# Patient Record
Sex: Female | Born: 1950
Health system: Southern US, Community
[De-identification: ages and names within clinical notes are randomized; demographics above are authoritative.]

## PROBLEM LIST (undated history)

## (undated) DIAGNOSIS — I10 Essential (primary) hypertension: Secondary | ICD-10-CM

## (undated) DIAGNOSIS — M1990 Unspecified osteoarthritis, unspecified site: Secondary | ICD-10-CM

## (undated) DIAGNOSIS — C801 Malignant (primary) neoplasm, unspecified: Secondary | ICD-10-CM

## (undated) DIAGNOSIS — R790 Abnormal level of blood mineral: Secondary | ICD-10-CM

## (undated) DIAGNOSIS — G8929 Other chronic pain: Secondary | ICD-10-CM

## (undated) DIAGNOSIS — I639 Cerebral infarction, unspecified: Secondary | ICD-10-CM

## (undated) DIAGNOSIS — E785 Hyperlipidemia, unspecified: Secondary | ICD-10-CM

## (undated) HISTORY — DX: Essential (primary) hypertension: I10

## (undated) HISTORY — DX: Hyperlipidemia, unspecified: E78.5

---

## 1998-09-14 ENCOUNTER — Encounter: Payer: Self-pay | Admitting: *Deleted

## 1998-09-14 ENCOUNTER — Ambulatory Visit (HOSPITAL_COMMUNITY): Admission: RE | Admit: 1998-09-14 | Discharge: 1998-09-14 | Payer: Self-pay | Admitting: *Deleted

## 1998-09-28 ENCOUNTER — Other Ambulatory Visit: Admission: RE | Admit: 1998-09-28 | Discharge: 1998-09-28 | Payer: Self-pay | Admitting: *Deleted

## 1999-12-08 ENCOUNTER — Other Ambulatory Visit: Admission: RE | Admit: 1999-12-08 | Discharge: 1999-12-08 | Payer: Self-pay | Admitting: *Deleted

## 1999-12-27 ENCOUNTER — Ambulatory Visit (HOSPITAL_COMMUNITY): Admission: RE | Admit: 1999-12-27 | Discharge: 1999-12-27 | Payer: Self-pay | Admitting: *Deleted

## 1999-12-27 ENCOUNTER — Encounter: Payer: Self-pay | Admitting: *Deleted

## 2002-03-09 ENCOUNTER — Inpatient Hospital Stay (HOSPITAL_COMMUNITY): Admission: EM | Admit: 2002-03-09 | Discharge: 2002-03-11 | Payer: Self-pay | Admitting: Emergency Medicine

## 2002-03-09 ENCOUNTER — Encounter: Payer: Self-pay | Admitting: Emergency Medicine

## 2002-03-10 ENCOUNTER — Encounter: Payer: Self-pay | Admitting: Cardiology

## 2005-03-24 ENCOUNTER — Ambulatory Visit: Payer: Self-pay | Admitting: Internal Medicine

## 2005-05-16 ENCOUNTER — Ambulatory Visit: Payer: Self-pay | Admitting: Internal Medicine

## 2006-03-27 ENCOUNTER — Ambulatory Visit: Payer: Self-pay | Admitting: Internal Medicine

## 2007-03-26 DIAGNOSIS — E785 Hyperlipidemia, unspecified: Secondary | ICD-10-CM

## 2007-03-26 DIAGNOSIS — I10 Essential (primary) hypertension: Secondary | ICD-10-CM | POA: Insufficient documentation

## 2007-04-10 ENCOUNTER — Telehealth: Payer: Self-pay | Admitting: Internal Medicine

## 2007-05-17 ENCOUNTER — Telehealth: Payer: Self-pay | Admitting: *Deleted

## 2007-05-17 ENCOUNTER — Ambulatory Visit: Payer: Self-pay | Admitting: Internal Medicine

## 2007-05-17 LAB — CONVERTED CEMR LAB
Cholesterol, target level: 200 mg/dL
HDL goal, serum: 40 mg/dL
LDL Goal: 160 mg/dL

## 2007-06-28 ENCOUNTER — Telehealth: Payer: Self-pay | Admitting: Internal Medicine

## 2008-01-24 ENCOUNTER — Telehealth: Payer: Self-pay | Admitting: *Deleted

## 2008-06-01 ENCOUNTER — Ambulatory Visit: Payer: Self-pay | Admitting: Internal Medicine

## 2008-06-01 DIAGNOSIS — J019 Acute sinusitis, unspecified: Secondary | ICD-10-CM

## 2008-06-02 ENCOUNTER — Telehealth: Payer: Self-pay | Admitting: *Deleted

## 2009-03-31 ENCOUNTER — Ambulatory Visit: Payer: Self-pay | Admitting: Internal Medicine

## 2009-04-01 LAB — CONVERTED CEMR LAB
BUN: 12 mg/dL (ref 6–23)
CO2: 26 meq/L (ref 19–32)
Calcium: 9.4 mg/dL (ref 8.4–10.5)
Chloride: 106 meq/L (ref 96–112)
Creatinine, Ser: 0.9 mg/dL (ref 0.4–1.2)
GFR calc non Af Amer: 68.74 mL/min (ref >60)
Glucose, Bld: 106 mg/dL — ABNORMAL HIGH (ref 70–99)
Potassium: 3.6 meq/L (ref 3.5–5.1)
Sodium: 141 meq/L (ref 135–145)
TSH: 1.04 microintl units/mL (ref 0.35–5.50)

## 2009-04-07 ENCOUNTER — Telehealth (INDEPENDENT_AMBULATORY_CARE_PROVIDER_SITE_OTHER): Payer: Self-pay | Admitting: *Deleted

## 2009-04-20 ENCOUNTER — Telehealth (INDEPENDENT_AMBULATORY_CARE_PROVIDER_SITE_OTHER): Payer: Self-pay | Admitting: *Deleted

## 2009-09-06 ENCOUNTER — Ambulatory Visit: Payer: Self-pay | Admitting: Internal Medicine

## 2009-09-06 ENCOUNTER — Telehealth: Payer: Self-pay | Admitting: *Deleted

## 2010-08-16 NOTE — Assessment & Plan Note (Signed)
Summary: sinus inf/headaches/cjr----PT [facility] // RS   Vital Signs:  Patient profile:   60 year old female Menstrual status:  postmenopausal Weight:      194 pounds Temp:     98.5 degrees F oral Pulse rate:   78 / minute BP sitting:   130 / 80  (left arm) Cuff size:   regular  Vitals Entered By: Romualdo Bolk, CMA Duncan Dull) (September 06, 2009 9:31 AM) CC: Sinus pressure and ha that started on 2/19   History of Present Illness: [name] comesin for .sda for above  .  " I have  a sinus infection again"       Different than a head cold because of the severity of the face pressure.   Cough last night.   used hot compresses   Ibuprofen some help.   Bifrontal is the worse area.  and using saline    washes.   10/10  Pain    . some cough but no SOB    . seems to get sinusitis about 2 x per year / trigger.   HT : has been doing well and no se of meds .  Still tobacco free.    Preventive Screening-Counseling & Management  Alcohol-Tobacco     Alcohol drinks/day: 1     Alcohol type: wine     Smoking Status: quit     Year Quit: 10 years ago  Caffeine-Diet-Exercise     Caffeine use/day: 5     Does Patient Exercise: yes  Current Medications (verified): 1)  Lisinopril-Hydrochlorothiazide 20-25 Mg Tabs (Lisinopril-Hydrochlorothiazide) .Marland Kitchen.. 1 By Mouth Once Daily 2)  Bayer Heart Health Advantage 400 Mg Tabs (Phytosterols)  Allergies (verified): No Known Drug Allergies  Past History:  Past medical, surgical, family and social histories (including risk factors) reviewed, and no changes noted (except as noted below).  Past Medical History: Reviewed history from 06/01/2008 and no changes required. Hyperlipidemia Hypertension    Family History: Reviewed history from 03/31/2009 and no changes required. Family History Diabetes 1st degree relative sib Family History High cholesterol Family History Hypertension Family History Lung cancer mom  Social History: Reviewed history  from 03/31/2009 and no changes required. 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         no regular ha or migraine hx   Physical Exam  General:  tired appearing in nad     washed out and congested  some nasal crease  Head:  normocephalic and atraumatic.   Eyes:  vision grossly intact, pupils equal, and pupils round.   Ears:  R ear normal and L ear normal.   Nose:  no external deformity and no external erythema.  congested   tnder frontal ethmoid area bilaterally  Mouth:  pharynx pink and moist.   Neck:  No deformities, masses, or tenderness noted. Lungs:  Normal respiratory effort, chest expands symmetrically. Lungs are clear to auscultation, no crackles or wheezes. Heart:  Normal rate and regular rhythm. S1 and S2 normal without gallop, murmur, click, rub or other extra sounds.no lifts.   Cervical Nodes:  No lymphadenopathy noted Psych:  Oriented X3, good eye contact, and not  anxious appearing.     Impression & Recommendations:  Problem # 1:  SINUSITIS- ACUTE-NOS (ICD-461.9) ? frontall  severity of pain  is impressive  and doesnt really seem like migraine   . will need follow up if persistent or progressive  . The following medications were removed from the medication list:    Amoxicillin 500 Mg Caps (Amoxicillin) .Marland Kitchen... 2 by mouth three times a day for sinusitis Her updated medication list for this problem includes:    Amoxicillin-pot Clavulanate 875-125 Mg Tabs (Amoxicillin-pot clavulanate) .Marland Kitchen... 1 by mouth two times a day for sinusitis  Problem # 2:  HYPERTENSION (ICD-401.9) stable  Her updated medication list for this  problem includes:    Lisinopril-hydrochlorothiazide 20-25 Mg Tabs (Lisinopril-hydrochlorothiazide) .Marland Kitchen... 1 by mouth once daily  Complete Medication List: 1)  Lisinopril-hydrochlorothiazide 20-25 Mg Tabs (Lisinopril-hydrochlorothiazide) .Marland Kitchen.. 1 by mouth once daily 2)  Bayer Heart Health Advantage 400 Mg Tabs (Phytosterols) 3)  Amoxicillin-pot Clavulanate 875-125 Mg Tabs (Amoxicillin-pot clavulanate) .Marland Kitchen.. 1 by mouth two times a day for sinusitis  Patient Instructions: 1)  expect to be  better in 5 days   call if not better  Prescriptions: AMOXICILLIN-POT CLAVULANATE 875-125 MG TABS (AMOXICILLIN-POT CLAVULANATE) 1 by mouth two times a day for sinusitis  #20 x 0   Entered and Authorized by:   Madelin Headings MD   Signed by:   Madelin Headings MD on 09/06/2009   Method used:   Print then Give to Patient   RxID:   [id number]

## 2010-08-16 NOTE — Progress Notes (Signed)
Summary: change antibiotics  Phone Note Call from Patient   Caller: Patient Call For: Madelin Headings MD Summary of Call: Pt cannot afford the Augmentin she was prescribed today by Dr. Fabian Sharp.  Would like to have it changed to Amoxicillin......Marland KitchenNicolette Bang ([place]). [phone]  Follow-up for Phone Call        ok amoxicillin 500 2 by mouth three times a day for 10 days Follow-up by: Madelin Headings MD,  September 06, 2009 12:19 PM  Additional Follow-up for Phone Call Additional follow up Details #1::        Rx sent to pharmacy and then I called the pharmacy and changed the directions to three times a day. Pt is aware of this. Additional Follow-up by: Romualdo Bolk, CMA (AAMA),  September 06, 2009 12:48 PM    New/Updated Medications: AMOXICILLIN 500 MG CAPS (AMOXICILLIN) 2 by mouth two times a day x 10 days Prescriptions: AMOXICILLIN 500 MG CAPS (AMOXICILLIN) 2 by mouth two times a day x 10 days  #40 x 0   Entered by:   Romualdo Bolk, CMA (AAMA)   Authorized by:   Madelin Headings MD   Signed by:   Romualdo Bolk, CMA (AAMA) on 09/06/2009   Method used:   Electronically to        [facility]  [phone]* (retail)       [address]       [place], [place]  [address]       Ph: [phone] or [phone]       Fax: [phone]   RxID:   [id number]

## 2010-11-24 ENCOUNTER — Other Ambulatory Visit: Payer: Self-pay | Admitting: Internal Medicine

## 2010-11-24 NOTE — Telephone Encounter (Signed)
Sent rx to pharmacy

## 2010-12-02 NOTE — Cardiovascular Report (Signed)
NAMEJESSICAMARIE, [name] NO.:  [id number]   MEDICAL RECORD NO.:  [id number]                   PATIENT TYPE:  INP   LOCATION:  2912                                 FACILITY:  [facility]   PHYSICIAN:  Arturo Morton. Riley Kill, M.D. [facility]         DATE OF BIRTH:  11-14-1950   DATE OF PROCEDURE:  03/09/2002  DATE OF DISCHARGE:                              CARDIAC CATHETERIZATION   INDICATIONS FOR PROCEDURE:  The patient is a 60 year old female who presents  with prolonged chest pain.  Electrocardiograms were nondiagnostic but  enzymes were positive.  She had recurrent ischemic chest discomfort and was  subsequently brought to the catheterization laboratory for further  evaluation.   PROCEDURE:  1. Left heart catheterization.  2. Selective coronary arteriography.  3. Selective left ventriculography.  4. Percutaneous transluminal coronary angioplasty and stenting of the     circumflex coronary artery.   DESCRIPTION OF PROCEDURE:  The patient was brought to the catheterization  lab and prepped and draped in the usual fashion.  We had difficulty engaging  the right femoral artery.  The left femoral artery was engaged.  A #7 French  sheath was placed.  Views of the left and right coronary arteries were  obtained in multiple angiographic projections.  Ventriculography was  performed in the RAO projection.  It was felt that percutaneous intervention  should be attempted with total occlusion of the circumflex artery.  Heparin  and Integrilin were given according to protocol with adequate ACTs.  We  tried to cross and had a difficult time getting across with the guidewire.  After extensive manipulation, we were able to eventually get across using a  [employer] PT wire.  Predilatations were performed and then  the vessel was eventually stented using a 3.0 x 24-mm length Express 2  stent.  This was taken across the area of total occlusion and an excellent  angiographic result was achieved.  We then used a 3.75 x 20-mm Quantum  Maverick which was taken up to about 10-12 atmospheres with full deployment  of the stent.  The patient was given oral Plavix and previously had been  given aspirin.  The procedure was subsequently completed.  All catheters  were removed.  The femoral sheath was sewn into place.   HEMODYNAMIC DATA:  1. Central aortic pressure 105/77.  2. Left ventricle 104/7.  3. No aortic to left ventricular gradient on pullback across the aortic     valve.   ANGIOGRAPHIC DATA:  1. Ventriculography was performed in the RAO projection.  Overall ejection     fraction was calculated at 52%.  There is inferobasal hypokinesis.     Significant mitral regurgitation was not present.  2. The left main coronary artery was free of critical disease.  3. The left anterior descending artery coursed to the apex.  There is about     30% segmental narrowing in the LAD just beyond the  diagonal.  The     diagonal itself has 40-50% narrowing segmentally in the proximal to mid     vessel.  The distal vessel is suitable for grafting.  4. The circumflex coronary artery is totally occluded after the first     marginal.  Following reperfusion therapy, the 100% stenosis was reduced     to 0% residual luminal narrowing.  There was a marked improvement in the     appearance of the artery with establishment of TIMI-3 flow from TIMI-0     flow.  Distally, the vessel provided three marginal branches.  One was     quite large and had about 40-50% proximal narrowing.  5. The right coronary artery demonstrates 40% proximal narrowing and then     40%, and 30-40% narrowing in the distal portion of the mid vessel.  There     is a posterior descending and posterolateral system.  These provide     collaterals to the distal circumflex system.   CONCLUSION:  1. Acute myocardial infarction due to occlusion of the circumflex artery.  2. Successful reperfusion therapy  provided with angioplasty and stenting of     the mid circumflex coronary artery as described in the above text.   DISPOSITION:  1. Discontinue smoking.  2. Aspirin and Plavix.  3. Glycoprotein IIb/IIIa inhibitors x 12-18 hours.  4. Cardiac rehabilitation program.  5. Continued beta blockade.                                               Arturo Morton. Riley Kill, M.D. [facility]    TDS/MEDQ  D:  03/10/2002  T:  03/10/2002  Job:  [id number]   cc:   Cecil Cranker, M.D. [facility]   Cardiac Catheterization Lab   Carolin Sicks, M.D. [facility]  [address]  [place]  [place] [address]

## 2010-12-02 NOTE — H&P (Signed)
[name], [name] NO.:  [id number]   MEDICAL RECORD NO.:  [id number]                   PATIENT TYPE:  EMS   LOCATION:  MAJO                                 FACILITY:  [facility]   PHYSICIAN:  Gerrit Friends. Dietrich Pates, M.D. [facility]        DATE OF BIRTH:  10-03-1950   DATE OF ADMISSION:  03/09/2002  DATE OF DISCHARGE:                                HISTORY & PHYSICAL   HISTORY OF PRESENT ILLNESS:  This 60 year old woman, a sister-in-law of one  or our office nurses, presents with prolonged chest discomfort.  The patient  has generally enjoyed excellent health.  She has had mild hypertension for  the past few years that has been well-treated medically by her PMD, Dr.  Neta Mends. Panosh.  She has been very active all of her life, including  kayaking and mountain biking.  She has been playing an unaccustomed amount  of tennis in recent weeks without difficulty.  For the past few days she has  noted malaise and chest heaviness.  There has been no associated dyspnea,  diaphoresis nor nausea.  The discomfort is fairly diffuse in the mid chest,  and is nonradiating.  There is no relationship to exertion or body position.  There has been some waxing and waning with intermittent resolution of her  discomfort.  She is free of symptoms at the present time.  Her brother, who  manages a clinical laboratory, obtained an electrocardiogram  which was  interpreted as abnormal and recommended further evaluation in the emergency  room.   Her cardiovascular risk factors are significant.  She has smoked 1/2 pack of  cigarettes per day for approximately 30 years.  She is unaware of her  cholesterol status.  She is four years post-menopausal.  There is a positive  family history.  Her father suffered a myocardial infarction at a young age.   PAST MEDICAL HISTORY:  Unremarkable.  She has never been admitted to a  hospital.  She has never had surgery.  She has had previous  electrocardiograms, which were interpreted as normal.  She does not recall  the antihypertensive medication that she is currently taking.   ALLERGIES:  She reports no allergies to medications.   SOCIAL HISTORY:  Unmarried, but has had a long-term companion.  No excessive  use of alcohol.   REVIEW OF SYSTEMS:  Negative except as noted above.   PHYSICAL EXAMINATION:  GENERAL:  A pleasant, well-appearing woman in no  acute distress.  VITAL SIGNS:  Heart rate 65 and regular, blood pressure 135/90, respirations  20 and unlabored, temperature 98 degrees.  O2 saturation 98% on room air.  NECK:  No jugular venous distention.  No carotid bruits.  LUNGS:  Clear.  HEART:  Normal first and second heart sounds.  ABDOMEN:  Soft, nontender.  No organomegaly.  EXTREMITIES:  No edema.  Normal distal pulses.  NEUROMUSCULAR:  Symmetric and strength and tone.  HEMATOPOIETIC:  No adenopathy.   LABORATORY AND ACCESSORY DATA:  Electrocardiogram:  Normal sinus rhythm.  Mild downsloping ST segment depression in leads V4-V6.   IMPRESSION AND PLAN:  A 60 year old woman with moderate cardiovascular risk  factors, but a generally active life style, without previous symptoms,  presents with chest discomfort that has some characteristics of myocardial  ischemia, but is basically atypical.  Her electrocardiographic abnormalities  are of concern, but could represent changes related to left ventricular  hypertrophy.   She certainly merits an observation stay with serial cardiac markers and an  echocardiogram.  Initial treatment will be with ________ which may be her  antihypertensive medication, aspirin, and sublingual nitroglycerin.  Further  workup and treatment will depend upon her initial course.                                               Gerrit Friends. Dietrich Pates, M.D. [facility]    RMR/MEDQ  D:  03/09/2002  T:  03/11/2002  Job:  [id number]

## 2010-12-02 NOTE — Discharge Summary (Signed)
NAMEMARCELE, [name] NO.:  [id number]   MEDICAL RECORD NO.:  [id number]                   PATIENT TYPE:  INP   LOCATION:  2018                                 FACILITY:  [facility]   PHYSICIAN:  Gerrit Friends. Dietrich Pates, M.D. [facility]        DATE OF BIRTH:  1951/02/23   DATE OF ADMISSION:  03/09/2002  DATE OF DISCHARGE:  03/11/2002                           DISCHARGE SUMMARY - REFERRING   SUMMARY OF HISTORY:  The patient is a 60 year old white female with a  history of mild hypertension, tobacco use and family history of coronary  artery disease.  She presented with a substernal chest heaviness that has  been nagging her for the preceding 24 hours.  She was noted to have ST  segment depression on her EKG on V4 through V6.  Her initial assessment was  negative but she was admitted for further evaluation.  Please refer to Dr.  Marvel Plan dictation.   LABORATORY DATA:  Fasting lipids of the 25th showed a total cholesterol of  192, triglycerides 96, HDL 62, LDL 111.  Initial CK was elevated at 1226  with MB of 146.6, relatively index 12.0 and troponin 12.47.  Second CK was  947 with MB of 67.2, relative index 7.1 and troponin 59.0.  Subsequent  enzymes and troponins were declining.  Admission sodium was 139, potassium  3.5, BUN 13, creatinine 0.8.  AST was slightly elevated at 132.  Her ALT was  slightly elevated at 41.  PT was 12.0, PTT 25.  H&H 15.2 and 45.3, normal  indices, platelets 210, WBC 14.2.  Chest x-ray did not show any active  disease.  Echocardiogram performed on 03/10/02 showed an ejection fraction  between 50 and 55 percent with basilar inferoposterior akinesis.  Left  atrial size was upper limits of normal.  Initial EKG showed normal sinus  rhythm, nonspecific ST-T wave changes.  Subsequent EKG showed T wave  inversion in leads 3 and AVF and some slight ST segment depression V4  through V6.   HOSPITAL COURSE:  The patient was initially admitted to  unit 2000.  She  began again having some further chest discomfort.  EKG did not show any  acute changes and Dr. Teena Dunk and I reviewed with Dr. Dietrich Pates and Dr.  Riley Kill and placed her on heparin and Integrelin and took her to the  catheterization lab on the evening of 03/09/2002.  Cardiac catheterization  was performed by Dr. Riley Kill.  According to his note she had 40% proximal  RCA, 40% mid RCA, 30-40% distal RCA.  She had 30% proximal LAD just up to  the diagonal one.  The diagonal one had a 40-50%/50% proximal lesion.  The  circumflex was totally occluded proximally with right to left laterals.  Dr.  Riley Kill performed angioplasty stenting reducing the 100% lesion to 0% with  an express stent.  Total bed rest sheath removal.  Catheterization site was  intact.  Enzymes and EKGs confirmed  non-Q wave myocardial infarction.  She  was maintained on Integrelin, heparin and nitroglycerin drip post her  catheterization.  Smoking cessation consult and cardiac rehab consult were  obtained.  Echocardiogram was performed as previously described.   By the 26th she was ambulating in the halls without difficulty.  Catheterization in her right and left groin showed some slight ecchymosis.  After review Dr. Riley Kill felt that she could be discharged home.   DISCHARGE DIAGNOSES:  1. Acute non-Q wave lateral myocardial infarction status post angioplasty     and stenting to the circumflex as previously described.  Residual     nonobstructive coronary artery disease with an ejection fraction of 52%     and inferobasilar hypokinesis.  2. Tobacco use.  3. Hypertension.   DISPOSITION:  She is discharged home.  She is given prescriptions for Plavix  75 mg q.d. for one month, coated aspirin 325 mg q.d., sublingual  nitroglycerin as needed, Zocor 40 mg q.h.s., and Toprol XL 25 mg q.d.  She  will stop by the office on the way home to pick up some samples of the  Zocor.  Our office did not have any samples of  Plavix.  She has been  instructed not to take her home medications that she was prior to admission,  but to bring all medications to her followup appointment.  She is advised no  lifting, working, driving, sexual activity or heavy exertion until seen by  the physician.  Maintain a low salt, fat and cholesterol diet.  If she has  any problems with her catheterization site she was asked to call us  immediately.  She was advised no smoking or tobacco products.  She will need  fasting lipids and LFTs in six weeks since we placed her on Zocor.  She will  see Dr. Rosalyn Charters PA on Thursday, March 20, 2002 at 10:30 a.m.     Malu Pellegrini DICTATOR                          Gerrit Friends. Dietrich Pates, M.D. [facility]    DD/MEDQ  D:  03/11/2002  T:  03/12/2002  Job:  [id number]

## 2011-06-17 ENCOUNTER — Other Ambulatory Visit: Payer: Self-pay | Admitting: Internal Medicine

## 2011-10-14 ENCOUNTER — Other Ambulatory Visit: Payer: Self-pay | Admitting: Internal Medicine

## 2011-10-18 ENCOUNTER — Telehealth: Payer: Self-pay | Admitting: Internal Medicine

## 2011-10-18 NOTE — Telephone Encounter (Signed)
Pt called and has a question regarding lisinopril-hydrochlorothiazide (PRINZIDE,ZESTORETIC) 20-25 MG per that she just pick up from pharmacy.

## 2011-10-19 NOTE — Telephone Encounter (Signed)
We have not seen her in almost  2 years . Her last labs in the EHR were in 2010 that I could find.   She needs to get BMP done here or elsewhere .  We will not keep refilling medication unless she gets lab and ov in the near future.   For now ok  To send in 60 pills as she requests and have her arrange above before it runs out.

## 2011-10-19 NOTE — Telephone Encounter (Signed)
Pt states she cannot come in at this time and she will make an appt to come in soon.  Pt states she has a new insurance and has no coverage on medications until after $200.  Pt states she can get a 90 day supply for $11.99.  Pls advise of in additional #60 tablets of lisinopril can be called in.

## 2011-10-20 NOTE — Telephone Encounter (Signed)
Called in additional 60 tablets to pharmacy.  Pt states she will make an appt in May sometime due to having a lot going on at this time.

## 2011-12-15 ENCOUNTER — Encounter: Payer: Self-pay | Admitting: Internal Medicine

## 2011-12-15 ENCOUNTER — Ambulatory Visit (INDEPENDENT_AMBULATORY_CARE_PROVIDER_SITE_OTHER): Payer: BC Managed Care – PPO | Admitting: Internal Medicine

## 2011-12-15 VITALS — BP 142/90 | HR 104 | Temp 98.5°F | Wt 195.0 lb

## 2011-12-15 DIAGNOSIS — I10 Essential (primary) hypertension: Secondary | ICD-10-CM

## 2011-12-15 DIAGNOSIS — Z5971 Insufficient health insurance coverage: Secondary | ICD-10-CM | POA: Insufficient documentation

## 2011-12-15 DIAGNOSIS — Z598 Other problems related to housing and economic circumstances: Secondary | ICD-10-CM

## 2011-12-15 DIAGNOSIS — Z6832 Body mass index (BMI) 32.0-32.9, adult: Secondary | ICD-10-CM | POA: Insufficient documentation

## 2011-12-15 DIAGNOSIS — E785 Hyperlipidemia, unspecified: Secondary | ICD-10-CM

## 2011-12-15 LAB — BASIC METABOLIC PANEL
BUN: 14 mg/dL (ref 6–23)
Calcium: 9.1 mg/dL (ref 8.4–10.5)
GFR: 62.45 mL/min (ref >60.00)
Glucose, Bld: 105 mg/dL — ABNORMAL HIGH (ref 70–99)
Sodium: 139 mEq/L (ref 135–145)

## 2011-12-15 MED ORDER — LISINOPRIL-HYDROCHLOROTHIAZIDE 20-25 MG PO TABS: 1.0000 | ORAL_TABLET | Freq: Every day | ORAL | Status: DC

## 2011-12-15 NOTE — Patient Instructions (Signed)
Continue med  You need to get  Lab tests.  Potassium sugar and kidney function .  Get a mammogram and you need to have a pap done  Check with the health department about the pap.  Check with insurance to see if  preventive is covered out of the deductable.

## 2011-12-15 NOTE — Progress Notes (Signed)
  Subjective:    Patient ID: [name], female    DOB: [birth year], 61 y.o.   MRN: [id number]  HPI Patient comes in today for follow up of  multiple medical problems.  Last visit was  Over a year ago  Has ht Bp readings  Have been ok.   Getting at [employer].    130 range.  No/se of meds.  Prn financial fill in at [employer]  .  pv last  mammo few year ago No recent pap. Exercising a t aquatic pool.    Swims.   Review of Systems No cp sob no cough   hhof 2 no  Current tobacc oto get better insurance next year.     Objective:   Physical Exam BP 142/90  Pulse 104  Temp(Src) 98.5 F (36.9 C) (Oral)  Wt 195 lb (88.451 kg)  SpO2 96% Wt Readings from Last 3 Encounters:  12/15/11 195 lb (88.451 kg)  09/06/09 194 lb (87.998 kg)  03/31/09 194 lb (87.998 kg)  WDWN in nad Neck: Supple without adenopathy or masses or bruits Chest:  Clear to A&P without wheezes rales or rhonchi CV:  S1-S2 no gallops or murmurs peripheral perfusion is normal Abdomen:  Sof,t normal bowel sounds without hepatosplenomegaly, no guarding rebound or masses no CVA tenderness No clubbing cyanosis or edema     Assessment & Plan:  Hypertension Better on meds and less stress  Disc importance of potass cr monitoring at minimum   Lipid  Hx of same Continue lifestyle intervention healthy eating and exercise . Financial high deduct able and cost concerns  Disc getting mammo and pap and check with pv for her current insurance  Family hx of dm  Continue lifestyle intervention healthy eating and exercise . Overweight    Intensify  lifestyle intervention healthy eating and exercise .

## 2012-04-10 ENCOUNTER — Ambulatory Visit (INDEPENDENT_AMBULATORY_CARE_PROVIDER_SITE_OTHER): Payer: BC Managed Care – PPO | Admitting: Family

## 2012-04-10 ENCOUNTER — Encounter: Payer: Self-pay | Admitting: Family

## 2012-04-10 VITALS — BP 140/88 | HR 95 | Wt 192.0 lb

## 2012-04-10 DIAGNOSIS — M25569 Pain in unspecified knee: Secondary | ICD-10-CM

## 2012-04-10 DIAGNOSIS — M7120 Synovial cyst of popliteal space [Baker], unspecified knee: Secondary | ICD-10-CM

## 2012-04-10 MED ORDER — TRAMADOL HCL 50 MG PO TABS: 50.0000 mg | ORAL_TABLET | Freq: Three times a day (TID) | ORAL | Status: DC | PRN

## 2012-04-10 NOTE — Progress Notes (Signed)
  Subjective:    Patient ID: [name], female    DOB: 20-Sep-1950, 61 y.o.   MRN: [id number]  HPI And 61 year old white female, nonsmoker is in with complaints of pain behind her right leg x3 weeks. Reports an increase in exercise, doing some extreme sports activities. Denies any acute injury. Rates the pain a 6/10, off and on that is worse with walking. She's been applying heating pads with no relief. She's taking Goody powders that has helped. Denies any redness. Denies any chest pain, shortness of breath, or headaches.   Review of Systems  Constitutional: Negative.   Respiratory: Negative.   Cardiovascular: Negative.   Gastrointestinal: Negative.   Musculoskeletal:       Pain in the back of the right knee  Skin: Negative.   Neurological: Negative.   Hematological: Negative.   Psychiatric/Behavioral: Negative.    Past Medical History  Diagnosis Date  . Hyperlipidemia   . Hypertension     History   Social History  . Marital Status: Single    Spouse Name: N/A    Number of Children: N/A  . Years of Education: N/A   Occupational History  . Not on file.   Social History Main Topics  . Smoking status: Former Games developer  . Smokeless tobacco: Not on file  . Alcohol Use: Yes     wine 2 glasses a day   . Drug Use: Not on file  . Sexually Active: Not on file   Other Topics Concern  . 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  Family History  Problem Relation Age of Onset  . Cancer Mother     lung cancer  . Diabetes Other   . Hyperlipidemia Other   . Hypertension Other   . Cancer Other     No Known Allergies  Current Outpatient Prescriptions on File Prior to Visit  Medication Sig Dispense Refill  . Krill Oil CAPS Take by mouth.      Marland Kitchen lisinopril-hydrochlorothiazide (PRINZIDE,ZESTORETIC) 20-25 MG per tablet Take 1 tablet by mouth daily.   90 tablet  3  . Phytosterols (BAYER HEART HEALTH ADVANTAGE) 400 MG TABS Take by mouth.        BP 140/88  Pulse 95  Wt 192 lb (87.091 kg)  SpO2 96%chart    Objective:   Physical Exam  Constitutional: She appears well-developed and well-nourished.  Neck: Normal range of motion. Neck supple.  Cardiovascular: Normal rate, regular rhythm and normal heart sounds.   Pulmonary/Chest: Effort normal and breath sounds normal.  Musculoskeletal: She exhibits tenderness.       Tenderness to palpation of the posterior right knee. Mild swelling to the posterior knee. But no redness. Pulses 2/2 popliteal and pedal.  Neurological: She is alert.  Skin: Skin is warm and dry.  Psychiatric: She has a normal mood and affect.          Assessment & Plan:  Assessment: Baker's cyst-likely, right knee pain  Plan: Offered ultrasound of the right knee. Patient declines. However Talbert Nan that she has a Baker's cyst. Would treat with anti-inflammatories and pain medication as needed. Patient call the office if symptoms persist. Recheck a schedule, when necessary.

## 2012-04-10 NOTE — Patient Instructions (Signed)
Baker's Cyst  A Baker's cyst is a swelling that forms in the back of the knee. It is a sac-like structure. It is filled with the same fluid that is located in your knee. The fluid located in your knee is necessary because it lubricates the bones and cartilage. It allows them to move over each other more easily.  CAUSES   When the knee becomes injured or has soreness (inflammation) present, more fluid forms in the knee. When this happens, the joint lining is pushed out behind the knee and forms the baker's cyst. This cyst may also be caused by inflammation from arthritic conditions and infections.  DIAGNOSIS   A Baker's cyst is most often diagnosed with an ultrasound. This is a specialized picture (like an X-ray). It shows a picture by using sound waves. Sometimes a specialized x-ray called an MRI (magnetic resonance imaging) is used. This picks up other problems within a joint if an ultrasound alone cannot make the diagnosis. If the cyst came immediately following an injury, plain x-rays may be used to make a diagnosis.  TREATMENT   The treatment depends on the cause of the cyst. But most of these cysts are caused by an inflammation. Anti-inflammatory medications and rest often will get rid of the problem. If the cyst is caused by an infection, medications (antibiotics) will be prescribed to help this. Take the medications as directed. Refer to Home Care Instructions, below, for additional treatment suggestions.  HOME CARE INSTRUCTIONS    If the cyst was caused by an injury, for the first 24 hours, while lying down, keep the injured extremity elevated on 2 pillows.   For the first 24 hours while you are awake, apply ice bags (ice in a plastic bag with a towel around it to prevent frostbite to skin) 3 to 4 times per day for 15 to 20 minutes to the injured area. Then do as directed by your caregiver.   Only take over-the-counter or prescription medicines for pain, discomfort, or fever as directed by your  caregiver.  Persistent pain and inability to use the injured area for more than 2 to 3 days are warning signs indicating that you should see a caregiver for a follow-up visit as soon as possible. Persistent pain and swelling indicate that further evaluation, non-weight bearing (use of crutches as instructed), and/or further x-rays are needed. Make a follow-up appointment with your own caregiver.  If conservative measures (rest, medications and inactivity) do not help the problem get better, sometimes surgery for removal of the cyst is needed. Reasons for this may be that the cyst is pressing on nerves and/or vessels and causing problems which cannot wait for improvement with conservative treatment. If the problem is caused by injuries to the cartilage in the knee, surgery is often needed for treatment of that problem.  MAKE SURE YOU:    Understand these instructions.   Will watch your condition.   Will get help right away if you are not doing well or get worse.  Document Released: 07/03/2005 Document Revised: 06/22/2011 Document Reviewed: 02/19/2008  Exit[employer] Patient Information 2012 [employer].

## 2012-12-16 ENCOUNTER — Other Ambulatory Visit: Payer: Self-pay | Admitting: Internal Medicine

## 2013-01-03 ENCOUNTER — Ambulatory Visit (INDEPENDENT_AMBULATORY_CARE_PROVIDER_SITE_OTHER): Payer: BC Managed Care – PPO | Admitting: Family

## 2013-01-03 ENCOUNTER — Encounter: Payer: Self-pay | Admitting: Family

## 2013-01-03 ENCOUNTER — Telehealth: Payer: Self-pay | Admitting: Internal Medicine

## 2013-01-03 VITALS — BP 140/100 | HR 67 | Wt 200.0 lb

## 2013-01-03 DIAGNOSIS — I10 Essential (primary) hypertension: Secondary | ICD-10-CM

## 2013-01-03 DIAGNOSIS — M25561 Pain in right knee: Secondary | ICD-10-CM

## 2013-01-03 DIAGNOSIS — M25569 Pain in unspecified knee: Secondary | ICD-10-CM

## 2013-01-03 LAB — BASIC METABOLIC PANEL
Calcium: 9.4 mg/dL (ref 8.4–10.5)
Chloride: 100 mEq/L (ref 96–112)
Creatinine, Ser: 0.9 mg/dL (ref 0.4–1.2)
GFR: 67.85 mL/min (ref >60.00)

## 2013-01-03 MED ORDER — METHYLPREDNISOLONE ACETATE 80 MG/ML IJ SUSP: 40.0000 mg | Freq: Once | INTRAMUSCULAR | Status: DC

## 2013-01-03 MED ORDER — LISINOPRIL-HYDROCHLOROTHIAZIDE 20-25 MG PO TABS: 1.0000 | ORAL_TABLET | Freq: Every day | ORAL | Status: DC

## 2013-01-03 NOTE — Telephone Encounter (Signed)
Pt is aware left detail message on vm

## 2013-01-03 NOTE — Telephone Encounter (Signed)
Pt would like to switch to NP from Dr Fabian Sharp. Can I sch?
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
## 2013-01-03 NOTE — Patient Instructions (Signed)
Wear and Tear Disorders of the Knee (Arthritis, Osteoarthritis)  Everyone will experience wear and tear injuries (arthritis, osteoarthritis) of the knee. These are the changes we all get as we age. They come from the joint stress of daily living. The amount of cartilage damage in your knee and your symptoms determine if you need surgery. Mild problems require approximately two months recovery time. More severe problems take several months to recover. With mild problems, your surgeon may find worn and rough cartilage surfaces. With severe changes, your surgeon may find cartilage that has completely worn away and exposed the bone. Loose bodies of bone and cartilage, bone spurs (excess bone growth), and injuries to the menisci (cushions between the large bones of your leg) are also common. All of these problems can cause pain.  For a mild wear and tear problem, rough cartilage may simply need to be shaved and smoothed. For more severe problems with areas of exposed bone, your surgeon may use an instrument for roughing up the bone surfaces to stimulate new cartilage growth. Loose bodies are usually removed. Torn menisci may be trimmed or repaired.  ABOUT THE ARTHROSCOPIC PROCEDURE  Arthroscopy is a surgical technique. It allows your orthopedic surgeon to diagnose and treat your knee injury with accuracy. The surgeon looks into your knee through a small scope. The scope is like a small (pencil-sized) telescope. Arthroscopy is less invasive than open knee surgery. You can expect a more rapid recovery. After the procedure, you will be moved to a recovery area until most of the effects of the medication have worn off. Your caregiver will discuss the test results with you.  RECOVERY  The severity of the arthritis and the type of procedure performed will determine recovery time. Other important factors include age, physical condition, medical conditions, and the type of rehabilitation program. Strengthening your muscles after  arthroscopy helps guarantee a better recovery. Follow your caregiver's instructions. Use crutches, rest, elevate, ice, and do knee exercises as instructed. Your caregivers will help you and instruct you with exercises and other physical therapy required to regain your mobility, muscle strength, and functioning following surgery. Only take over-the-counter or prescription medicines for pain, discomfort, or fever as directed by your caregiver.   SEEK MEDICAL CARE IF:   · There is increased bleeding (more than a small spot) from the wound.  · You notice redness, swelling, or increasing pain in the wound.  · Pus is coming from wound.  · You develop an unexplained oral temperature above 102° F (38.9° C) , or as your caregiver suggests.  · You notice a foul smell coming from the wound or dressing.  · You have severe pain with motion of the knee.  SEEK IMMEDIATE MEDICAL CARE IF:   · You develop a rash.  · You have difficulty breathing.  · You have any allergic problems.  MAKE SURE YOU:   · Understand these instructions.  · Will watch your condition.  · Will get help right away if you are not doing well or get worse.  Document Released: 06/30/2000 Document Revised: 09/25/2011 Document Reviewed: 11/27/2007  Exit[employer]® Patient Information ©2014 [employer].

## 2013-01-03 NOTE — Telephone Encounter (Signed)
If Dr. Fabian Sharp approves, then ok

## 2013-01-03 NOTE — Telephone Encounter (Signed)
Ok with me 

## 2013-04-14 ENCOUNTER — Telehealth: Payer: Self-pay | Admitting: Family

## 2013-04-14 NOTE — Telephone Encounter (Signed)
Pt is calling requesting [name] to return her call. Pt would not elaborate

## 2013-04-14 NOTE — Telephone Encounter (Signed)
Left message to advise pt samples ready to pick up

## 2013-04-14 NOTE — Telephone Encounter (Signed)
Pt would like to try celebrex for the pain she has been experiencing. She states that the tramadol does not work for her. Advised that I would ask Oran Rein about it and see if there are samples available for her to try

## 2013-04-25 ENCOUNTER — Encounter: Payer: Self-pay | Admitting: Family

## 2013-04-25 ENCOUNTER — Ambulatory Visit (INDEPENDENT_AMBULATORY_CARE_PROVIDER_SITE_OTHER): Payer: BC Managed Care – PPO | Admitting: Family

## 2013-04-25 VITALS — BP 140/92 | HR 90 | Wt 208.0 lb

## 2013-04-25 DIAGNOSIS — Z23 Encounter for immunization: Secondary | ICD-10-CM

## 2013-04-25 DIAGNOSIS — IMO0002 Reserved for concepts with insufficient information to code with codable children: Secondary | ICD-10-CM

## 2013-04-25 DIAGNOSIS — M545 Low back pain, unspecified: Secondary | ICD-10-CM

## 2013-04-25 DIAGNOSIS — M5416 Radiculopathy, lumbar region: Secondary | ICD-10-CM

## 2013-04-25 DIAGNOSIS — G8929 Other chronic pain: Secondary | ICD-10-CM

## 2013-04-25 NOTE — Progress Notes (Signed)
Subjective:    Patient ID: [name], female    DOB: 12/14/1950, 62 y.o.   MRN: [id number]  HPI 62 year old white female, nonsmoker who presents today with complaints of low back pain that radiates down her right leg. Symptoms have been ongoing for several years. In the past, she had an MRI that revealed a bulging disc. She is doing home physical therapy without assistance. The pain waxes and wanes in intensity typically ranging from 8-10. She is refusing any surgical intervention. Does not want to be up-to-date images. Is requesting a cortisone injection in her back. She was prescribed Celebrex to try but she has not picked up the samples from our office yet. Tramadol has been ineffective.   Review of Systems  Constitutional: Negative.   Respiratory: Negative.   Cardiovascular: Negative.   Genitourinary: Negative.   Musculoskeletal: Positive for arthralgias and back pain.  Skin: Negative.   Neurological: Negative.  Negative for light-headedness and numbness.  Psychiatric/Behavioral: Negative.    Past Medical History  Diagnosis Date  . Hyperlipidemia   . Hypertension     History   Social History  . Marital Status: Single    Spouse Name: N/A    Number of Children: N/A  . Years of Education: N/A   Occupational History  . Not on file.   Social History Main Topics  . Smoking status: Former Games developer  . Smokeless tobacco: Not on file  . Alcohol Use: Yes     Comment: wine 2 glasses a day   . Drug Use: Not on file  . Sexual Activity: Not on file   Other Topics Concern  . 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 No pertinent past surgical history.  Family History  Problem Relation Age of Onset  . Cancer Mother     lung cancer  . Diabetes Other   . Hyperlipidemia Other   . Hypertension Other   . Cancer Other     No Known  Allergies  Current Outpatient Prescriptions on File Prior to Visit  Medication Sig Dispense Refill  . Krill Oil CAPS Take by mouth.      Marland Kitchen lisinopril-hydrochlorothiazide (PRINZIDE,ZESTORETIC) 20-25 MG per tablet Take 1 tablet by mouth daily.  90 tablet  3  . Phytosterols (BAYER HEART HEALTH ADVANTAGE) 400 MG TABS Take by mouth.      . traMADol (ULTRAM) 50 MG tablet Take 1-2 tablets (50-100 mg total) by mouth every 8 (eight) hours as needed for pain.  30 tablet  0   No current facility-administered medications on file prior to visit.    BP 140/92  Pulse 90  Wt 208 lb (94.348 kg)  BMI 34.61 kg/m2  SpO2 96%chart    Objective:   Physical Exam  Constitutional: She is oriented to person, place, and time. She appears well-developed and well-nourished.  Neck: Normal range of motion. Neck supple.  Cardiovascular: Normal rate, regular rhythm and normal heart sounds.   Pulmonary/Chest: Effort normal and breath sounds normal.  Musculoskeletal: She exhibits tenderness. She exhibits no edema.  Tenderness to palpation of the lower. No pain with ROM  Neurological: She is alert and oriented to person, place, and time. She has normal reflexes. No cranial nerve deficit. Coordination normal.  Skin: Skin is warm and dry.  Psychiatric: She has a normal mood and affect.          Assessment & Plan:  Assessment: 1. Low back pain 2. Lumbar radiculopathy  Plan: Celebrex 200 mg one capsule daily with food. Patient does not want to escalate care with regard to her back pain. Low back strengthening exercises. Patient are the office with any questions or concerns. Recheck as scheduled, and as needed.

## 2013-04-25 NOTE — Patient Instructions (Signed)

## 2013-04-25 NOTE — Addendum Note (Signed)
Addended by: Beverely Low on: 04/25/2013 12:02 PM   Modules accepted: Orders

## 2013-07-31 ENCOUNTER — Telehealth: Payer: Self-pay | Admitting: Family

## 2013-07-31 NOTE — Telephone Encounter (Signed)
Pt would like [name] to call her back concerning medicaton

## 2013-08-01 NOTE — Telephone Encounter (Signed)
Pt would like to know if Padonda can remove fluid from knee. Advised that it is possible and that she will need to schedule an OV. She states that she will call back next week for an appointment

## 2013-08-04 ENCOUNTER — Encounter: Payer: Self-pay | Admitting: Family

## 2013-08-04 ENCOUNTER — Ambulatory Visit (INDEPENDENT_AMBULATORY_CARE_PROVIDER_SITE_OTHER): Payer: BC Managed Care – PPO | Admitting: Family

## 2013-08-04 VITALS — BP 154/98 | HR 98 | Wt 211.0 lb

## 2013-08-04 DIAGNOSIS — M25561 Pain in right knee: Secondary | ICD-10-CM

## 2013-08-04 DIAGNOSIS — M7051 Other bursitis of knee, right knee: Secondary | ICD-10-CM

## 2013-08-04 DIAGNOSIS — M25569 Pain in unspecified knee: Secondary | ICD-10-CM

## 2013-08-04 DIAGNOSIS — M76899 Other specified enthesopathies of unspecified lower limb, excluding foot: Secondary | ICD-10-CM

## 2013-08-04 MED ORDER — HYDROCODONE-ACETAMINOPHEN 10-325 MG PO TABS: 0.5000 | ORAL_TABLET | Freq: Three times a day (TID) | ORAL | Status: DC | PRN

## 2013-08-04 MED ORDER — METHYLPREDNISOLONE ACETATE 80 MG/ML IJ SUSP: 80.0000 mg | Freq: Once | INTRAMUSCULAR | Status: DC

## 2013-08-04 NOTE — Progress Notes (Signed)
Pre visit review using our clinic review tool, if applicable. No additional management support is needed unless otherwise documented below in the visit note. 

## 2013-08-04 NOTE — Patient Instructions (Signed)
Bursitis Bursitis is a swelling and soreness (inflammation) of a fluid-filled sac (bursa) that overlies and protects a joint. It can be caused by injury, overuse of the joint, arthritis or infection. The joints most likely to be affected are the elbows, shoulders, hips and knees. HOME CARE INSTRUCTIONS   Apply ice to the affected area for 15-20 minutes each hour while awake for 2 days. Put the ice in a plastic bag and place a towel between the bag of ice and your skin.  Rest the injured joint as much as possible, but continue to put the joint through a full range of motion, 4 times per day. (The shoulder joint especially becomes rapidly "frozen" if not used.) When the pain lessens, begin normal slow movements and usual activities.  Only take over-the-counter or prescription medicines for pain, discomfort or fever as directed by your caregiver.  Your caregiver may recommend draining the bursa and injecting medicine into the bursa. This may help the healing process.  Follow all instructions for follow-up with your caregiver. This includes any orthopedic referrals, physical therapy and rehabilitation. Any delay in obtaining necessary care could result in a delay or failure of the bursitis to heal and chronic pain. SEEK IMMEDIATE MEDICAL CARE IF:   Your pain increases even during treatment.  You develop an oral temperature above 102 F (38.9 C) and have heat and inflammation over the involved bursa. MAKE SURE YOU:   Understand these instructions.  Will watch your condition.  Will get help right away if you are not doing well or get worse. Document Released: 06/30/2000 Document Revised: 09/25/2011 Document Reviewed: 06/04/2009 [facility] Patient Information 2014 [place].

## 2013-08-04 NOTE — Progress Notes (Signed)
   Subjective:    Patient ID: [name], female    DOB: 1951/05/10, 63 y.o.   MRN: [id number]  HPI  63 year old caucasian female presenting with right knee pain. Symptoms began in August 2014.  Pain is constant and sharp, worse with walking and going up stairs.  She has tried Celebrex, Ultram, and Advil.  Pain still present. She does not want surgery.Denies injury.     Review of Systems  Constitutional: Negative.   Respiratory: Negative.   Cardiovascular: Negative.   Gastrointestinal: Negative.   Endocrine: Negative.   Genitourinary: Negative.   Musculoskeletal: Positive for arthralgias and joint swelling.       Right knee pain 10/10  Skin: Negative.   Neurological: Negative.   Hematological: Negative.   Psychiatric/Behavioral: Negative.        Objective:   Physical Exam  Constitutional: She is oriented to person, place, and time. She appears well-developed and well-nourished.  HENT:  Head: Normocephalic.  Eyes: Pupils are equal, round, and reactive to light.  Cardiovascular: Normal rate and regular rhythm.   Pulmonary/Chest: Effort normal and breath sounds normal.  Musculoskeletal: She exhibits edema and tenderness.  Decreased ROM on right leg.  Swelling and tenderness on palpation  Neurological: She is alert and oriented to person, place, and time.  Skin: Skin is warm and dry.     Right knee injection: Informed consent obtained and the patient's knee was prepped with betadine. Local anesthesia was obtained with topical spray. Then 40 mg of Depo-Medrol and 1/2 cc of lidocaine was injected into the joint space. The patient tolerated the procedure without complications. Post injection care discussed with patient.     Assessment & Plan:  Assessment 1. Bursitis 2. Right knee pain   Plan 1. Ice to right knee. 2.  Vicodin 10-325 as needed.  3 Contact office if pain persists or worsen. Consider referral to orthopedics

## 2013-08-07 ENCOUNTER — Telehealth: Payer: Self-pay | Admitting: Family

## 2013-08-07 NOTE — Telephone Encounter (Signed)
Pt calling per your request to fu on treatment. pls call pt.

## 2013-08-08 NOTE — Telephone Encounter (Signed)
Pt states that pain is 8:12 now and she is extremely thankful to [facility] for the tx.

## 2013-12-10 ENCOUNTER — Other Ambulatory Visit: Payer: Self-pay | Admitting: Family

## 2013-12-19 ENCOUNTER — Other Ambulatory Visit: Payer: Self-pay | Admitting: Family

## 2014-03-04 ENCOUNTER — Other Ambulatory Visit: Payer: Self-pay | Admitting: Family

## 2014-05-15 ENCOUNTER — Encounter: Payer: Self-pay | Admitting: Family

## 2014-05-15 ENCOUNTER — Ambulatory Visit (INDEPENDENT_AMBULATORY_CARE_PROVIDER_SITE_OTHER): Payer: BC Managed Care – PPO | Admitting: Family

## 2014-05-15 VITALS — BP 130/80 | HR 100 | Wt 206.4 lb

## 2014-05-15 DIAGNOSIS — M7051 Other bursitis of knee, right knee: Secondary | ICD-10-CM

## 2014-05-15 DIAGNOSIS — I10 Essential (primary) hypertension: Secondary | ICD-10-CM

## 2014-05-15 DIAGNOSIS — Z720 Tobacco use: Secondary | ICD-10-CM

## 2014-05-15 DIAGNOSIS — M25561 Pain in right knee: Secondary | ICD-10-CM

## 2014-05-15 DIAGNOSIS — Z23 Encounter for immunization: Secondary | ICD-10-CM

## 2014-05-15 LAB — COMPREHENSIVE METABOLIC PANEL
ALT: 27 U/L (ref 0–35)
AST: 25 U/L (ref 0–37)
Albumin: 3.8 g/dL (ref 3.5–5.2)
Alkaline Phosphatase: 47 U/L (ref 39–117)
BILIRUBIN TOTAL: 0.7 mg/dL (ref 0.2–1.2)
BUN: 12 mg/dL (ref 6–23)
CO2: 30 meq/L (ref 19–32)
CREATININE: 0.9 mg/dL (ref 0.4–1.2)
Calcium: 9.7 mg/dL (ref 8.4–10.5)
Chloride: 101 mEq/L (ref 96–112)
GFR: 65.85 mL/min (ref >60.00)
Glucose, Bld: 98 mg/dL (ref 70–99)
Potassium: 4.2 mEq/L (ref 3.5–5.1)
Sodium: 138 mEq/L (ref 135–145)
Total Protein: 7.3 g/dL (ref 6.0–8.3)

## 2014-05-15 MED ORDER — METHYLPREDNISOLONE ACETATE 40 MG/ML IJ SUSP: 40.0000 mg | Freq: Once | INTRAMUSCULAR | Status: DC

## 2014-05-15 MED ORDER — LISINOPRIL-HYDROCHLOROTHIAZIDE 20-25 MG PO TABS: ORAL_TABLET | ORAL | Status: DC

## 2014-05-15 MED ORDER — HYDROCODONE-ACETAMINOPHEN 10-325 MG PO TABS: 0.5000 | ORAL_TABLET | Freq: Three times a day (TID) | ORAL | Status: DC | PRN

## 2014-05-15 NOTE — Progress Notes (Signed)
Subjective:    Patient ID: [name], female    DOB: 12/04/1950, 63 y.o.   MRN: [id number]  Knee Pain    63 year old white female, smoker with a history of hypertension hyperlipidemia is in today with complaints of right knee pain. In the past she's gotten cortisone injections that have been very beneficial and is requesting to have that done today. Pain in her right knee as 8 out of 10, worse with going up steps. Continues to decline x-ray. Continues to decline referral to orthopedics because she will never entertain the idea of surgical intervention.   Review of Systems  Constitutional: Negative.   HENT: Negative.   Respiratory: Negative.   Cardiovascular: Negative.   Gastrointestinal: Negative.   Endocrine: Negative.   Genitourinary: Negative.   Musculoskeletal: Positive for arthralgias.       Right knee pain  Skin: Negative.   Allergic/Immunologic: Negative.   Neurological: Negative.   Hematological: Negative.   Psychiatric/Behavioral: Negative.    Past Medical History  Diagnosis Date  . Hyperlipidemia   . Hypertension     History   Social History  . Marital Status: Single    Spouse Name: N/A    Number of Children: N/A  . Years of Education: N/A   Occupational History  . Not on file.   Social History Main Topics  . Smoking status: Former Research scientist (life sciences)  . Smokeless tobacco: Not on file  . Alcohol Use: Yes     Comment: wine 2 glasses a day   . Drug Use: Not on file  . Sexual Activity: Not on file   Other Topics Concern  . 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  Family History  Problem Relation Age of Onset  . Cancer Mother     lung cancer  . Diabetes Other   . Hyperlipidemia Other   . Hypertension Other   . Cancer Other     No Known Allergies  Current Outpatient Prescriptions on File Prior to  Visit  Medication Sig Dispense Refill  . Krill Oil CAPS Take by mouth.      . Phytosterols (BAYER HEART HEALTH ADVANTAGE) 400 MG TABS Take by mouth.      . traMADol (ULTRAM) 50 MG tablet TAKE 1-2 TABLETS (50-100 MG TOTAL) BY MOUTH EVERY 8 (EIGHT) HOURS AS NEEDED FOR PAIN.  30 tablet  0   No current facility-administered medications on file prior to visit.    BP 130/80  Pulse 100  Wt 206 lb 6.4 oz (93.622 kg)chart    Objective:   Physical Exam  Constitutional: She is oriented to person, place, and time. She appears well-developed and well-nourished.  Neck: Normal range of motion. Neck supple.  Cardiovascular: Normal rate, regular rhythm and normal heart sounds.   Pulmonary/Chest: Effort normal and breath sounds normal.  Abdominal: Soft. Bowel sounds are normal.  Musculoskeletal: She exhibits tenderness. She exhibits no edema.  Tenderness to palpation of the anterior aspect of the right knee. No obvious swelling. Positive crepitus  Neurological: She is alert and oriented to person, place, and time.  Skin: Skin is warm and dry.  Psychiatric: She has a normal mood and affect.     Informed consent obtained and the patient's knee was prepped with  betadine. Local anesthesia was obtained with topical spray. Then 40 mg of Depo-Medrol and 1/2 cc of lidocaine was injected into the joint space. The patient tolerated the procedure without complications. Post injection care discussed with patient.      Assessment & Plan:  [name] was seen today for knee pain.  Diagnoses and associated orders for this visit:  Right anterior knee pain - methylPREDNISolone acetate (DEPO-MEDROL) injection 40 mg; Inject 1 mL (40 mg total) into the articular space once.  Essential hypertension - CMP  Tobacco abuse  Knee bursitis, right - methylPREDNISolone acetate (DEPO-MEDROL) injection 40 mg; Inject 1 mL (40 mg total) into the articular space once.  Encounter for immunization  Other  Orders - lisinopril-hydrochlorothiazide (PRINZIDE,ZESTORETIC) 20-25 MG per tablet; TAKE 1 TABLET BY MOUTH DAILY. - HYDROcodone-acetaminophen (NORCO) 10-325 MG per tablet; Take 0.5-1 tablets by mouth every 8 (eight) hours as needed.    Advised patient to schedule a complete physical exam as soon as possible. Temporary supply blood pressure medication provided today. Obtain CMP. Joint injection done today. Advised may be up to 72 hours before maximum results are achieved.

## 2014-05-15 NOTE — Progress Notes (Signed)
Pre visit review using our clinic review tool, if applicable. No additional management support is needed unless otherwise documented below in the visit note. 

## 2014-05-15 NOTE — Patient Instructions (Signed)

## 2014-05-18 ENCOUNTER — Telehealth: Payer: Self-pay | Admitting: Family

## 2014-05-18 NOTE — Telephone Encounter (Signed)
emmi mailed  °

## 2014-08-14 ENCOUNTER — Telehealth: Payer: Self-pay | Admitting: Family

## 2014-08-14 NOTE — Telephone Encounter (Signed)
Pt called to ask if you would call her back [name]. She said she received her letter about Padonda leaving.

## 2014-08-14 NOTE — Telephone Encounter (Signed)
Spoke with pt and she will establish with Dr. Maudie Mercury

## 2014-11-11 ENCOUNTER — Other Ambulatory Visit: Payer: Self-pay | Admitting: Family

## 2015-05-21 ENCOUNTER — Other Ambulatory Visit: Payer: Self-pay | Admitting: Family

## 2015-11-24 ENCOUNTER — Other Ambulatory Visit: Payer: Self-pay | Admitting: *Deleted

## 2015-11-24 MED ORDER — LISINOPRIL-HYDROCHLOROTHIAZIDE 20-25 MG PO TABS: 1.0000 | ORAL_TABLET | Freq: Every day | ORAL | Status: DC

## 2016-01-12 ENCOUNTER — Encounter: Payer: Self-pay | Admitting: Family Medicine

## 2016-01-12 ENCOUNTER — Ambulatory Visit (INDEPENDENT_AMBULATORY_CARE_PROVIDER_SITE_OTHER): Payer: BC Managed Care – PPO | Admitting: Family Medicine

## 2016-01-12 VITALS — BP 140/80 | HR 87 | Temp 98.3°F | Resp 12 | Ht 65.0 in | Wt 209.0 lb

## 2016-01-12 DIAGNOSIS — I10 Essential (primary) hypertension: Secondary | ICD-10-CM

## 2016-01-12 DIAGNOSIS — L02415 Cutaneous abscess of right lower limb: Secondary | ICD-10-CM

## 2016-01-12 MED ORDER — LISINOPRIL-HYDROCHLOROTHIAZIDE 20-25 MG PO TABS: 1.0000 | ORAL_TABLET | Freq: Every day | ORAL | Status: DC

## 2016-01-12 MED ORDER — SULFAMETHOXAZOLE-TRIMETHOPRIM 800-160 MG PO TABS: 1.0000 | ORAL_TABLET | Freq: Two times a day (BID) | ORAL | Status: AC

## 2016-01-12 NOTE — Progress Notes (Signed)
HPI:  ACUTE VISIT:  Chief Complaint  Patient presents with  . Insect Bite    Pt thinks it may have happened on Thursday, she works outside frequently. She has tried tree tea oil.    [name] is a 65 y.o. female, who is here today complaining of 5-6 days of skin lesion, a "knot" on right thigh, which she assumes it was an insect bite but not sure. Lesion was noted after she was working on her yard, initially pruritic, then it became warm and tender. Today she noted purulent drainage and feels much better, not longer warm.    She has tried tea tree oil and local heat. No fever, chills, or arthralgias associated.  She denies oral lesions/edema,abdominal pain, nausea, or vomiting.  Hypertension: She is also requesting refill for her antihypertensive medication, which she has not taken for a couple days. Currently she  is on Lisinopril-HCTZ 20-25 mg daily. No side effects reported.  She has not noted unusual headache, visual changes, exertional chest pain, dyspnea,  focal weakness, or edema.   Lab Results  Component Value Date   CREATININE 0.9 05/15/2014   BUN 12 05/15/2014   NA 138 05/15/2014   K 4.2 05/15/2014   CL 101 05/15/2014   CO2 30 05/15/2014      Review of Systems  Constitutional: Negative for fever, activity change, appetite change, fatigue and unexpected weight change.  HENT: Negative for mouth sores, nosebleeds and trouble swallowing.   Eyes: Negative for redness and visual disturbance.  Respiratory: Negative for cough, shortness of breath and wheezing.   Cardiovascular: Negative for chest pain, palpitations and leg swelling.  Gastrointestinal: Negative for nausea, vomiting and abdominal pain.       Negative for changes in bowel habits.  Genitourinary: Negative for dysuria, hematuria, decreased urine volume and difficulty urinating.  Musculoskeletal: Negative for myalgias and arthralgias.  Skin: Negative for pallor and rash.  Neurological:  Negative for seizures, syncope, weakness, numbness and headaches.      Current Outpatient Prescriptions on File Prior to Visit  Medication Sig Dispense Refill  . Krill Oil CAPS Take by mouth.     No current facility-administered medications on file prior to visit.     Past Medical History  Diagnosis Date  . Hyperlipidemia   . Hypertension    No Known Allergies  Social History   Social History  . Marital Status: Single    Spouse Name: N/A  . Number of Children: N/A  . Years of Education: N/A   Social History Main Topics  . Smoking status: Former Research scientist (life sciences)  . Smokeless tobacco: None  . Alcohol Use: Yes     Comment: wine 2 glasses a day   . Drug Use: None  . Sexual Activity: Not Asked   Other Topics Concern  . 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      Physical Exam  Constitutional: She is oriented to person, place, and time. She appears well-developed. No distress.  HENT:  Head: Atraumatic.  Eyes: Conjunctivae are normal.  Cardiovascular: Normal rate and regular rhythm.   No murmur heard.  Respiratory: Effort normal and breath sounds normal. No respiratory distress.  GI: Soft. She exhibits no mass. There is no tenderness.  Musculoskeletal: She exhibits no edema.  Neurological: She is alert and oriented to person, place, and time.  Skin: Skin is warm. Lesion noted. There is erythema.     4 cm indurated area, no local heat, fluctuant area , 1 cm. Tender, mild erythema.  Psychiatric: She has a normal mood and affect.      ASSESSMENT AND PLAN:     [name] was seen today for insect bite.  Diagnoses and all orders for this visit:  Abscess of right thigh  After verbal consent incision with a blade # 15 on fluctuant  area was performed, 5 mm. Minimal amount of purulent drainage obtain. She tolerated procedure well, no complications. Oral abx, local heat, and instructed about warning signs. F/U in 5-7 days, before if needed.   -     sulfamethoxazole-trimethoprim (BACTRIM DS,SEPTRA DS) 800-160 MG tablet; Take 1 tablet by mouth 2 (two) times daily.  Essential hypertension   No changes in current management. Next OV labs are needed. Low salt diet and annual eye exam recommended.    -     lisinopril-hydrochlorothiazide (PRINZIDE,ZESTORETIC) 20-25 MG tablet; Take 1 tablet by mouth daily.        -[name] advised to return or notify a doctor immediately if symptoms worsen or persist or new concerns arise, she voices understanding.       Stephano Arrants G. [name], MD  [facility]. [place] office.

## 2016-01-12 NOTE — Progress Notes (Signed)
Pre visit review using our clinic review tool, if applicable. No additional management support is needed unless otherwise documented below in the visit note. 

## 2016-01-12 NOTE — Patient Instructions (Signed)
A few things to remember from today's visit:   1. Abscess of right thigh  I&D not much drained. Continue warm compresses. Follow-up in 5 days, before if needed. If fever, chills, worsening abscess please seek immediate medical attention.  - sulfamethoxazole-trimethoprim (BACTRIM DS,SEPTRA DS) 800-160 MG tablet; Take 1 tablet by mouth 2 (two) times daily.  Dispense: 14 tablet; Refill: 0  2. Essential hypertension  - lisinopril-hydrochlorothiazide (PRINZIDE,ZESTORETIC) 20-25 MG tablet; Take 1 tablet by mouth daily.  Dispense: 90 tablet; Refill: 1      If you sign-up for My chart, you can communicate easier with [place] in case you have any question or concern.

## 2016-07-06 ENCOUNTER — Other Ambulatory Visit: Payer: Self-pay | Admitting: Family Medicine

## 2016-07-06 DIAGNOSIS — I10 Essential (primary) hypertension: Secondary | ICD-10-CM

## 2016-10-13 ENCOUNTER — Other Ambulatory Visit: Payer: Self-pay | Admitting: Family Medicine

## 2016-10-13 DIAGNOSIS — I10 Essential (primary) hypertension: Secondary | ICD-10-CM

## 2017-04-21 ENCOUNTER — Other Ambulatory Visit: Payer: Self-pay | Admitting: Family Medicine

## 2017-04-21 DIAGNOSIS — I10 Essential (primary) hypertension: Secondary | ICD-10-CM

## 2017-07-27 ENCOUNTER — Other Ambulatory Visit: Payer: Self-pay | Admitting: Family Medicine

## 2017-07-27 DIAGNOSIS — I10 Essential (primary) hypertension: Secondary | ICD-10-CM

## 2017-10-27 ENCOUNTER — Other Ambulatory Visit: Payer: Self-pay | Admitting: Family Medicine

## 2017-10-27 DIAGNOSIS — I10 Essential (primary) hypertension: Secondary | ICD-10-CM

## 2017-10-31 ENCOUNTER — Other Ambulatory Visit: Payer: Self-pay | Admitting: Family Medicine

## 2017-10-31 DIAGNOSIS — I10 Essential (primary) hypertension: Secondary | ICD-10-CM

## 2017-12-14 ENCOUNTER — Ambulatory Visit: Payer: Medicare Other | Admitting: Family Medicine

## 2017-12-14 ENCOUNTER — Encounter: Payer: Self-pay | Admitting: Family Medicine

## 2017-12-14 VITALS — BP 130/88 | HR 85 | Temp 98.5°F | Resp 16 | Ht 65.0 in | Wt 215.1 lb

## 2017-12-14 DIAGNOSIS — L989 Disorder of the skin and subcutaneous tissue, unspecified: Secondary | ICD-10-CM | POA: Diagnosis not present

## 2017-12-14 DIAGNOSIS — G8929 Other chronic pain: Secondary | ICD-10-CM

## 2017-12-14 DIAGNOSIS — M25561 Pain in right knee: Secondary | ICD-10-CM | POA: Diagnosis not present

## 2017-12-14 DIAGNOSIS — L409 Psoriasis, unspecified: Secondary | ICD-10-CM | POA: Diagnosis not present

## 2017-12-14 MED ORDER — CLOBETASOL PROPIONATE 0.05 % EX CREA: 1.0000 "application " | TOPICAL_CREAM | Freq: Two times a day (BID) | CUTANEOUS | 0 refills | Status: DC

## 2017-12-14 NOTE — Patient Instructions (Signed)
  [name] I have seen you today for an acute visit.  A few things to remember from today's visit:   Skin lesion of right leg - Plan: Ambulatory referral to Dermatology  Psoriasis - Plan: clobetasol cream (TEMOVATE) 0.05 %, Ambulatory referral to Dermatology  Chronic pain of right knee   Medications prescribed today are intended for short period of time and will not be refill upon request, a follow up appointment might be necessary to discuss continuation of of treatment if appropriate.   It is very important to arrange appointment with dermatologist, lesion on right thigh could be a skin cancer.   In general please monitor for signs of worsening symptoms and seek immediate medical attention if any concerning.    I hope you get better soon!

## 2017-12-14 NOTE — Progress Notes (Signed)
ACUTE VISIT   HPI:  Chief Complaint  Patient presents with  . Psoriasis    right leg, pain and itching, fluid in knee    Ms.AMEERA TIGUE is a 67 y.o. female, who is here today complaining of pruritic rash on lower extremities intermittent since 11/2016 and "a place" on right side that she noted about 2 to 3 weeks ago.  No history of trauma. She has history of psoriasis, used to follow with dermatologist, currently she is not on pharmacologic treatment.  She is using Hemp oil, gold band cream, home remedies "natural stuff", coconut oil and apple cider vinegar. She does sun baths for 8 to 10 minutes daily.  Lesions are very pruritic, they seem to be exacerbated by stress.  Lesion on right side of thigh is mildly tender. She has used topical cider vinegar. No history of skin cancer. Lesion seems to be getting worse.  Right knee pain, 10/10, intermittent sharp/achy. She wonders if I can "take fluid out" the knee. According to patient, she has received intra-articular steroid, which did not help. She tells me that she is not willing to have knee surgery. No recent trauma. Pain is exacerbated by prolonged walking and standing, sitting on a hard stool, and walking stairs. She is trying to lose weight, cooking more at home, hoping that this helps with the pain.    Review of Systems  Constitutional: Positive for fatigue. Negative for appetite change, chills and fever.  HENT: Negative for mouth sores and sore throat.   Eyes: Negative for discharge and redness.  Respiratory: Negative for cough, shortness of breath and wheezing.   Cardiovascular: Negative for leg swelling.  Gastrointestinal: Negative for abdominal pain, diarrhea, nausea and vomiting.  Musculoskeletal: Positive for arthralgias, gait problem and joint swelling.  Skin: Positive for rash. Negative for wound.  Neurological: Negative for weakness and headaches.  Psychiatric/Behavioral: Negative for confusion.  The patient is nervous/anxious.       Current Outpatient Medications on File Prior to Visit  Medication Sig Dispense Refill  . Krill Oil CAPS Take by mouth.    Marland Kitchen lisinopril-hydrochlorothiazide (PRINZIDE,ZESTORETIC) 20-25 MG tablet TAKE 1 TABLET BY MOUTH EVERY DAY *FOLLOW UP APPOINTMENT NEEDED* 90 tablet 0   No current facility-administered medications on file prior to visit.      Past Medical History:  Diagnosis Date  . Hyperlipidemia   . Hypertension    No Known Allergies  Social History   Socioeconomic History  . Marital status: Single    Spouse name: Not on file  . Number of children: Not on file  . Years of education: Not on file  . Highest education level: Not on file  Occupational History  . Not on file  Social Needs  . Financial resource strain: Not on file  . Food insecurity:    Worry: Not on file    Inability: Not on file  . Transportation needs:    Medical: Not on file    Non-medical: Not on file  Tobacco Use  . Smoking status: Former Research scientist (life sciences)  . Smokeless tobacco: Never Used  Substance and Sexual Activity  . Alcohol use: Yes    Comment: wine 2 glasses a day   . Drug use: Not on file    Comment: Hemp Oil  . Sexual activity: Not Currently  Lifestyle  . Physical activity:    Days per week: Not on file    Minutes per session: Not on file  . Stress: Not  on file  Relationships  . Social connections:    Talks on phone: Not on file    Gets together: Not on file    Attends religious service: Not on file    Active member of club or organization: Not on file    Attends meetings of clubs or organizations: Not on file    Relationship status: Not on file  Other Topics Concern  . 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    Physical Exam  Nursing note and vitals reviewed. Constitutional: She is oriented to person, place, and time. She appears well-developed. No distress.  HENT:  Head: Normocephalic and atraumatic.  Mouth/Throat: Oropharynx is clear and moist and mucous membranes are normal.  Eyes: Conjunctivae are normal.  Cardiovascular: Normal rate and regular rhythm.  No murmur heard. Respiratory: Effort normal and breath sounds normal. No respiratory distress.  Musculoskeletal: She exhibits no edema.       Right knee: She exhibits normal range of motion, no effusion and no erythema.  Right knee with mild crepitus with movement. No significant limitation of ROM.   Lymphadenopathy:    She has no cervical adenopathy.  Neurological: She is alert and oriented to person, place, and time.  Antalgic gait, otherwise stable without assistance.  Skin: Skin is warm. Lesion and rash noted.     Lateral aspect of right thigh with a raised erythematosus nodular lesion with ulcerated center.  About 2.5 cm. Mildly tender upon palpation. Defined borders.  Scattered  erythematous, plaque, scaly lesions on distal right lower extremity,some on left LE, and elbows.  Psychiatric: Her mood appears anxious.  Well groomed, good eye contact.    ASSESSMENT AND PLAN:   Ms. Catalaya was seen today for psoriasis.  Diagnoses and all orders for this visit:  Skin lesion of right leg  We discussed possible causes of lesion on right thigh, keratoacanthoma vs squamous cell carcinoma. Strongly recommend arranging an appointment with dermatologist, she would like to arrange her own appointment, I placed referral today.  -     Ambulatory referral to Dermatology  Psoriasis  Topical steroid recommended, she is not sure if she wants to try because she has read "it can cause cancer." She can discuss with dermatologist all other treatment options but she states that she is not interested in any biologic  medication. Adverse effects of sun exposure were discussed.  -     clobetasol cream (TEMOVATE) 0.05 %; Apply 1 application topically 2 (two) times daily. 14 days -     Ambulatory referral to Dermatology  Chronic pain of right knee  Possible etiology discussed, OA vs psoriatic arthritis. Explained that there is not an indication for arthrocentesis at this time, we discussed possible complications of procedure. She may need to reconsider surgical treatment and following with Ortho given the fact intra-articular steroid have not helped.  In case of psoriatic arthritis there are other treatment options that may help but she is not interested in any of those.   Return if symptoms worsen or fail to improve, for pcp.      [name]. [name], MD  [place]. [place] office.

## 2018-06-19 ENCOUNTER — Inpatient Hospital Stay (HOSPITAL_COMMUNITY): Payer: Medicare Other | Admitting: Anesthesiology

## 2018-06-19 ENCOUNTER — Inpatient Hospital Stay (HOSPITAL_COMMUNITY)
Admission: EM | Admit: 2018-06-19 | Discharge: 2018-06-20 | DRG: 378 | Disposition: A | Discharge status: Home / Self Care | Payer: Medicare Other | Attending: Family Medicine | Admitting: Family Medicine

## 2018-06-19 ENCOUNTER — Encounter (HOSPITAL_COMMUNITY): Payer: Self-pay

## 2018-06-19 ENCOUNTER — Other Ambulatory Visit: Payer: Self-pay

## 2018-06-19 ENCOUNTER — Encounter (HOSPITAL_COMMUNITY)
Admission: EM | Disposition: A | Discharge status: Home / Self Care | Payer: Self-pay | Source: Home / Self Care | Attending: Internal Medicine

## 2018-06-19 DIAGNOSIS — K625 Hemorrhage of anus and rectum: Secondary | ICD-10-CM

## 2018-06-19 DIAGNOSIS — K264 Chronic or unspecified duodenal ulcer with hemorrhage: Secondary | ICD-10-CM | POA: Diagnosis not present

## 2018-06-19 DIAGNOSIS — T39395A Adverse effect of other nonsteroidal anti-inflammatory drugs [NSAID], initial encounter: Secondary | ICD-10-CM | POA: Diagnosis not present

## 2018-06-19 DIAGNOSIS — D649 Anemia, unspecified: Secondary | ICD-10-CM

## 2018-06-19 DIAGNOSIS — D6489 Other specified anemias: Secondary | ICD-10-CM | POA: Diagnosis not present

## 2018-06-19 DIAGNOSIS — K254 Chronic or unspecified gastric ulcer with hemorrhage: Secondary | ICD-10-CM | POA: Diagnosis present

## 2018-06-19 DIAGNOSIS — G8929 Other chronic pain: Secondary | ICD-10-CM | POA: Diagnosis present

## 2018-06-19 DIAGNOSIS — K2210 Ulcer of esophagus without bleeding: Secondary | ICD-10-CM | POA: Diagnosis not present

## 2018-06-19 DIAGNOSIS — D62 Acute posthemorrhagic anemia: Secondary | ICD-10-CM | POA: Diagnosis present

## 2018-06-19 DIAGNOSIS — L409 Psoriasis, unspecified: Secondary | ICD-10-CM | POA: Diagnosis present

## 2018-06-19 DIAGNOSIS — Z87891 Personal history of nicotine dependence: Secondary | ICD-10-CM | POA: Diagnosis not present

## 2018-06-19 DIAGNOSIS — K922 Gastrointestinal hemorrhage, unspecified: Secondary | ICD-10-CM | POA: Diagnosis not present

## 2018-06-19 DIAGNOSIS — I10 Essential (primary) hypertension: Secondary | ICD-10-CM

## 2018-06-19 DIAGNOSIS — E785 Hyperlipidemia, unspecified: Secondary | ICD-10-CM | POA: Diagnosis not present

## 2018-06-19 DIAGNOSIS — M1611 Unilateral primary osteoarthritis, right hip: Secondary | ICD-10-CM | POA: Diagnosis not present

## 2018-06-19 HISTORY — DX: Other chronic pain: G89.29

## 2018-06-19 HISTORY — PX: BIOPSY: SHX5522

## 2018-06-19 HISTORY — PX: ESOPHAGOGASTRODUODENOSCOPY (EGD) WITH PROPOFOL: SHX5813

## 2018-06-19 LAB — COMPREHENSIVE METABOLIC PANEL
ALT: 14 U/L (ref 0–44)
AST: 15 U/L (ref 15–41)
Albumin: 2.6 g/dL — ABNORMAL LOW (ref 3.5–5.0)
Alkaline Phosphatase: 37 U/L — ABNORMAL LOW (ref 38–126)
Anion gap: 7 (ref 5–15)
BUN: 28 mg/dL — ABNORMAL HIGH (ref 8–23)
CO2: 20 mmol/L — ABNORMAL LOW (ref 22–32)
Calcium: 7.8 mg/dL — ABNORMAL LOW (ref 8.9–10.3)
Chloride: 112 mmol/L — ABNORMAL HIGH (ref 98–111)
Creatinine, Ser: 0.74 mg/dL (ref 0.44–1.00)
GFR calc Af Amer: >60 mL/min (ref >60)
GFR calc non Af Amer: >60 mL/min (ref >60)
Glucose, Bld: 149 mg/dL — ABNORMAL HIGH (ref 70–99)
Potassium: 4.1 mmol/L (ref 3.5–5.1)
Sodium: 139 mmol/L (ref 135–145)
Total Bilirubin: 0.4 mg/dL (ref 0.3–1.2)
Total Protein: 4.9 g/dL — ABNORMAL LOW (ref 6.5–8.1)

## 2018-06-19 LAB — AMMONIA: Ammonia: 16 umol/L (ref 9–35)

## 2018-06-19 LAB — CBC
HEMATOCRIT: 33.6 % — AB (ref 36.0–46.0)
Hemoglobin: 10.1 g/dL — ABNORMAL LOW (ref 12.0–15.0)
MCH: 32.3 pg (ref 26.0–34.0)
MCHC: 30.1 g/dL (ref 30.0–36.0)
MCV: 107.3 fL — ABNORMAL HIGH (ref 80.0–100.0)
Platelets: 179 10*3/uL (ref 150–400)
RBC: 3.13 MIL/uL — ABNORMAL LOW (ref 3.87–5.11)
RDW: 14.5 % (ref 11.5–15.5)
WBC: 9.9 10*3/uL (ref 4.0–10.5)
nRBC: 0 % (ref 0.0–0.2)

## 2018-06-19 LAB — PROTIME-INR
INR: 1.01
Prothrombin Time: 13.2 seconds (ref 11.4–15.2)

## 2018-06-19 LAB — TYPE AND SCREEN
ABO/RH(D): A POS
Antibody Screen: NEGATIVE

## 2018-06-19 LAB — ACETAMINOPHEN LEVEL

## 2018-06-19 LAB — HEMOGLOBIN AND HEMATOCRIT, BLOOD
HCT: 26.7 % — ABNORMAL LOW (ref 36.0–46.0)
HCT: 30.3 % — ABNORMAL LOW (ref 36.0–46.0)
Hemoglobin: 8.1 g/dL — ABNORMAL LOW (ref 12.0–15.0)
Hemoglobin: 9.3 g/dL — ABNORMAL LOW (ref 12.0–15.0)

## 2018-06-19 LAB — SALICYLATE LEVEL: Salicylate Lvl: <7 mg/dL (ref 2.8–30.0)

## 2018-06-19 LAB — POC OCCULT BLOOD, ED: Fecal Occult Bld: POSITIVE — AB

## 2018-06-19 LAB — ABO/RH: ABO/RH(D): A POS

## 2018-06-19 SURGERY — ESOPHAGOGASTRODUODENOSCOPY (EGD) WITH PROPOFOL
Anesthesia: Monitor Anesthesia Care

## 2018-06-19 MED ORDER — ONDANSETRON HCL 4 MG PO TABS: 4.0000 mg | ORAL_TABLET | Freq: Four times a day (QID) | ORAL | Status: DC | PRN

## 2018-06-19 MED ORDER — ONDANSETRON HCL 4 MG/2ML IJ SOLN: 4.0000 mg | Freq: Four times a day (QID) | INTRAMUSCULAR | Status: DC | PRN

## 2018-06-19 MED ORDER — SODIUM CHLORIDE 0.9 % IV SOLN: INTRAVENOUS | Status: DC

## 2018-06-19 MED ORDER — PROPOFOL 500 MG/50ML IV EMUL
INTRAVENOUS | Status: DC | PRN
  Administered 2018-06-19: 50 mg via INTRAVENOUS

## 2018-06-19 MED ORDER — PANTOPRAZOLE SODIUM 40 MG IV SOLR
40.0000 mg | Freq: Two times a day (BID) | INTRAVENOUS | Status: DC
  Administered 2018-06-19 – 2018-06-20 (×2): 40 mg via INTRAVENOUS
  Filled 2018-06-19 (×2): qty 40

## 2018-06-19 MED ORDER — PROPOFOL 10 MG/ML IV BOLUS
INTRAVENOUS | Status: AC
  Filled 2018-06-19: qty 40

## 2018-06-19 MED ORDER — PROPOFOL 500 MG/50ML IV EMUL
INTRAVENOUS | Status: DC | PRN
  Administered 2018-06-19: 100 ug/kg/min via INTRAVENOUS

## 2018-06-19 MED ORDER — DEXTROSE-NACL 5-0.9 % IV SOLN
INTRAVENOUS | Status: DC
  Administered 2018-06-19 – 2018-06-20 (×2): via INTRAVENOUS

## 2018-06-19 MED ORDER — ACETAMINOPHEN 325 MG PO TABS
650.0000 mg | ORAL_TABLET | Freq: Four times a day (QID) | ORAL | Status: DC
  Administered 2018-06-19 – 2018-06-20 (×3): 650 mg via ORAL
  Filled 2018-06-19 (×3): qty 2

## 2018-06-19 MED ORDER — SODIUM CHLORIDE 0.9 % IV BOLUS
1000.0000 mL | Freq: Once | INTRAVENOUS | Status: AC
  Administered 2018-06-19: 1000 mL via INTRAVENOUS

## 2018-06-19 MED ORDER — LACTATED RINGERS IV SOLN
INTRAVENOUS | Status: DC
  Administered 2018-06-19: 1000 mL via INTRAVENOUS

## 2018-06-19 MED ORDER — ONDANSETRON HCL 4 MG/2ML IJ SOLN
INTRAMUSCULAR | Status: DC | PRN
  Administered 2018-06-19: 4 mg via INTRAVENOUS

## 2018-06-19 SURGICAL SUPPLY — 15 items

## 2018-06-19 NOTE — Anesthesia Postprocedure Evaluation (Signed)
Anesthesia Post Note  Patient: [name]  Procedure(s) Performed: ESOPHAGOGASTRODUODENOSCOPY (EGD) WITH PROPOFOL (N/A ) BIOPSY     Patient location during evaluation: Endoscopy Anesthesia Type: MAC Level of consciousness: awake and alert Pain management: pain level controlled Vital Signs Assessment: post-procedure vital signs reviewed and stable Respiratory status: spontaneous breathing, nonlabored ventilation, respiratory function stable and patient connected to nasal cannula oxygen Cardiovascular status: stable and blood pressure returned to baseline Postop Assessment: no apparent nausea or vomiting Anesthetic complications: no    Last Vitals:  Vitals:   06/19/18 1617 06/19/18 1640  BP: 121/85 124/76  Pulse: 86 94  Resp: 20 17  Temp:  36.7 C  SpO2: 93% 97%    Last Pain:  Vitals:   06/19/18 1640  TempSrc: Oral  PainSc: 0-No pain                 Montez Hageman

## 2018-06-19 NOTE — Consult Note (Signed)
[facility] CON[facility] Reason for consult: Melenic stools Referring Physician: [facility] hospitalist/ER.  PCP: None currently patient unassigned.  Primary GI: None patient unassigned  [name] is an 67 y.o. female.  HPI: She has a history of severe chronic pain with severe arthritis which is been attributed to previous athletic endeavors and possibly to psoriatic arthritis.  She has severe psoriasis.  She has been to pain clinics has been trying to avoid narcotics.  She has had 2 or 3 dark-colored stools since yesterday it became maroonish was feeling a little bit weak.  For the past month she has been taking Goody powders for 5 times a day to control her pain because she wants to avoid the risk factors from narcotics.  She came to the ER hemoglobin was 10.1 and she initially was quite hypotensive but did improve with IV fluids.  She has had no further bleeding.  Her medical history is remarkable for chronic pain and hypertension.  Past Medical History:  Diagnosis Date  . Chronic pain   . Hyperlipidemia   . Hypertension     History reviewed. No pertinent surgical history.  Family History  Problem Relation Age of Onset  . Cancer Mother        lung cancer  . Diabetes Other   . Hyperlipidemia Other   . Hypertension Other   . Cancer Other     Social History:  reports that she has quit smoking. She has never used smokeless tobacco. She reports that she drinks alcohol. Her drug history is not on file.  Allergies: No Known Allergies  Medications; Prior to Admission medications   Medication Sig Start Date End Date Taking? Authorizing Provider  Aspirin-Acetaminophen-Caffeine (GOODY HEADACHE PO) Take 2 Packages by mouth 2 (two) times daily as needed (headache).   Yes [provider]  Cyanocobalamin (VITAMIN B-12) 1000 MCG/15ML LIQD Take 15 mLs by mouth daily.   Yes [provider]  clobetasol cream (TEMOVATE) 6.62 % Apply 1 application topically 2 (two) times  daily. 14 days Patient not taking: Reported on 06/19/2018 12/14/17   [name], [name], MD  lisinopril-hydrochlorothiazide ([place]) 20-25 MG tablet TAKE 1 TABLET BY MOUTH EVERY DAY *FOLLOW UP APPOINTMENT NEEDED* Patient not taking: Reported on 06/19/2018 11/05/17   [name], [name], MD    PRN Meds  Results for orders placed or performed during the hospital encounter of 06/19/18 (from the past 48 hour(s))  ABO/Rh     Status: None   Collection Time: 06/19/18  8:27 AM  Result Value Ref Range   ABO/RH(D)      A POS Performed at [facility] [address] [place], [place] [address]   Comprehensive metabolic panel     Status: Abnormal   Collection Time: 06/19/18  8:36 AM  Result Value Ref Range   Sodium 139 135 - 145 mmol/L   Potassium 4.1 3.5 - 5.1 mmol/L   Chloride 112 (H) 98 - 111 mmol/L   CO2 20 (L) 22 - 32 mmol/L   Glucose, Bld 149 (H) 70 - 99 mg/dL   BUN 28 (H) 8 - 23 mg/dL   Creatinine, Ser 0.74 0.44 - 1.00 mg/dL   Calcium 7.8 (L) 8.9 - 10.3 mg/dL   Total Protein 4.9 (L) 6.5 - 8.1 g/dL   Albumin 2.6 (L) 3.5 - 5.0 g/dL   AST 15 15 - 41 U/L   ALT 14 0 - 44 U/L   Alkaline Phosphatase 37 (L) 38 - 126 U/L  Total Bilirubin 0.4 0.3 - 1.2 mg/dL   GFR calc non Af Amer >60 >60 mL/min   GFR calc Af Amer >60 >60 mL/min   Anion gap 7 5 - 15    Comment: Performed at [facility], [place] [address] [place], [place] [address]  CBC     Status: Abnormal   Collection Time: 06/19/18  8:36 AM  Result Value Ref Range   WBC 9.9 4.0 - 10.5 K/uL   RBC 3.13 (L) 3.87 - 5.11 MIL/uL   Hemoglobin 10.1 (L) 12.0 - 15.0 g/dL   HCT 33.6 (L) 36.0 - 46.0 %   MCV 107.3 (H) 80.0 - 100.0 fL   MCH 32.3 26.0 - 34.0 pg   MCHC 30.1 30.0 - 36.0 g/dL   RDW 14.5 11.5 - 15.5 %   Platelets 179 150 - 400 K/uL   nRBC 0.0 0.0 - 0.2 %    Comment: Performed at [facility], [place] [address], [place], [place] [address]  Type and screen [place]     Status: None   Collection Time: 06/19/18  8:36 AM  Result Value Ref Range   ABO/RH(D) A POS    Antibody Screen NEG    Sample Expiration      06/22/2018 Performed at [facility], [place] [address], [place], [place] [address]   Acetaminophen level     Status: Abnormal   Collection Time: 06/19/18  8:36 AM  Result Value Ref Range   Acetaminophen (Tylenol), Serum <10 (L) 10 - 30 ug/mL    Comment: (NOTE) Therapeutic concentrations vary significantly. A range of 10-30 ug/mL  may be an effective concentration for many patients. However, some  are best treated at concentrations outside of this range. Acetaminophen concentrations >150 ug/mL at 4 hours after ingestion  and >50 ug/mL at 12 hours after ingestion are often associated with  toxic reactions. Performed at [facility], [place] [address], [place], [place] [address]   Protime-INR     Status: None   Collection Time: 06/19/18  8:36 AM  Result Value Ref Range   Prothrombin Time 13.2 11.4 - 15.2 seconds   INR 1.01     Comment: Performed at [facility], [place] [address] [place], [place] [address]  Salicylate level     Status: None   Collection Time: 06/19/18  8:36 AM  Result Value Ref Range   Salicylate Lvl <6.5 2.8 - 30.0 mg/dL    Comment: Performed at [facility], [place] [address] [place], [place] [address]  Ammonia     Status: None   Collection Time: 06/19/18  8:49 AM  Result Value Ref Range   Ammonia 16 9 - 35 umol/L    Comment: Performed at [facility], [place] [address] [place], [place] [address]  POC occult blood, ED     Status: Abnormal   Collection Time: 06/19/18 10:15 AM  Result Value Ref Range   Fecal Occult Bld POSITIVE (A) NEGATIVE    No results found. ROS: Constitutional: Chronic pain and severe psoriasis.  No weight loss etc.  HEENT: No sore throats or visual disturbances. Cardiovascular: No  cardiovascular history. Respiratory: Denies any prior history of COPD coughing etc. GI: Denies prior ulcers or abdominal pain GU: Negative Musculoskeletal: Severe arthritis of questionable cause. Neuro/Psychiatric: Other than chronic pain occasionally radiating down the legs from her back she has no neurological symptoms. Endocrine/Heme: Negative  Blood pressure (!) 149/98, pulse 86, resp. rate 17, height 5\' 5"  (1.651 m), weight 97.5 kg, SpO2 96 %.  Physical exam:   General--Pleasant white female in no acute distress ENT--nonicteric dry mucous membranes no coffee-ground material in the throat  Neck--supple no lymphadenopathy Heart--regular rate and rhythm without murmurs or gallops Lungs--clear Abdomen--soft and completely nontender Psych--alert and oriented answers questions appropriately   Assessment: 1.  Melena.  This is most consistent with ulcer disease secondary to chronic Goody powders use 2.  Chronic pain due to severe osteoarthritis  Plan: We will proceed with EGD later this morning to evaluate for NSAID induced ulcer.  Have discussed this with the patient and she is agreeable.  We will go ahead and empirically treat her with PPI.   Nancy Fetter 06/19/2018, 1:07 PM   This note was created using voice recognition software and minor errors may Have occurred unintentionally. Pager: [phone] If no answer or after hours call [phone]

## 2018-06-19 NOTE — ED Notes (Signed)
Bed: WA17 Expected date:  Expected time:  Means of arrival:  Comments: Res

## 2018-06-19 NOTE — Anesthesia Preprocedure Evaluation (Signed)
Anesthesia Evaluation  Patient identified by MRN, date of birth, ID band Patient awake    Reviewed: Allergy & Precautions, NPO status , Patient's Chart, lab work & pertinent test results  Airway Mallampati: II  TM Distance: >3 FB Neck ROM: Full    Dental no notable dental hx. (+) Upper Dentures, Lower Dentures   Pulmonary Current Smoker, former smoker,    Pulmonary exam normal breath sounds clear to auscultation       Cardiovascular hypertension, Pt. on medications Normal cardiovascular exam Rhythm:Regular Rate:Normal     Neuro/Psych negative neurological ROS  negative psych ROS   GI/Hepatic negative GI ROS, Neg liver ROS,   Endo/Other  negative endocrine ROS  Renal/GU negative Renal ROS  negative genitourinary   Musculoskeletal negative musculoskeletal ROS (+)   Abdominal   Peds negative pediatric ROS (+)  Hematology negative hematology ROS (+)   Anesthesia Other Findings Chronic pain  Reproductive/Obstetrics negative OB ROS                             Anesthesia Physical Anesthesia Plan  ASA: III  Anesthesia Plan: MAC   Post-op Pain Management:    Induction:   PONV Risk Score and Plan: 2 and Treatment may vary due to age or medical condition  Airway Management Planned: Nasal Cannula  Additional Equipment:   Intra-op Plan:   Post-operative Plan:   Informed Consent: I have reviewed the patients History and Physical, chart, labs and discussed the procedure including the risks, benefits and alternatives for the proposed anesthesia with the patient or authorized representative who has indicated his/her understanding and acceptance.   Dental advisory given  Plan Discussed with:   Anesthesia Plan Comments:         Anesthesia Quick Evaluation

## 2018-06-19 NOTE — Transfer of Care (Signed)
Immediate Anesthesia Transfer of Care Note  Patient: [name]  Procedure(s) Performed: Procedure(s): ESOPHAGOGASTRODUODENOSCOPY (EGD) WITH PROPOFOL (N/A) BIOPSY  Patient Location: PACU  Anesthesia Type:MAC  Level of Consciousness:  sedated, patient cooperative and responds to stimulation  Airway & Oxygen Therapy:Patient Spontanous Breathing and Patient connected to face mask oxgen  Post-op Assessment:  Report given to PACU RN and Post -op Vital signs reviewed and stable  Post vital signs:  Reviewed and stable  Last Vitals:  Vitals:   06/19/18 1230 06/19/18 1411  BP: (!) 149/98 128/75  Pulse: 86 89  Resp: 17 16  Temp:  36.7 C  SpO2: 93% 79%    Complications: No apparent anesthesia complications

## 2018-06-19 NOTE — Interval H&P Note (Signed)
History and Physical Interval Note:  06/19/2018 2:12 PM  [name]  has presented today for surgery, with the diagnosis of Gi Bleed  The various methods of treatment have been discussed with the patient and family. After consideration of risks, benefits and other options for treatment, the patient has consented to  Procedure(s): ESOPHAGOGASTRODUODENOSCOPY (EGD) WITH PROPOFOL (N/A) as a surgical intervention .  The patient's history has been reviewed, patient examined, no change in status, stable for surgery.  I have reviewed the patient's chart and labs.  Questions were answered to the patient's satisfaction.     Nancy Fetter

## 2018-06-19 NOTE — Progress Notes (Signed)
Pt delayed coming to the floor due to going for her EGD before coming up from the ED.

## 2018-06-19 NOTE — H&P (View-Only) (Signed)
[facility] Reason for consult: Melenic stools Referring Physician: [facility] hospitalist/ER.  PCP: None currently patient unassigned.  Primary GI: None patient unassigned  [name] is an 67 y.o. female.  HPI: She has a history of severe chronic pain with severe arthritis which is been attributed to previous athletic endeavors and possibly to psoriatic arthritis.  She has severe psoriasis.  She has been to pain clinics has been trying to avoid narcotics.  She has had 2 or 3 dark-colored stools since yesterday it became maroonish was feeling a little bit weak.  For the past month she has been taking Goody powders for 5 times a day to control her pain because she wants to avoid the risk factors from narcotics.  She came to the ER hemoglobin was 10.1 and she initially was quite hypotensive but did improve with IV fluids.  She has had no further bleeding.  Her medical history is remarkable for chronic pain and hypertension.  Past Medical History:  Diagnosis Date  . Chronic pain   . Hyperlipidemia   . Hypertension     History reviewed. No pertinent surgical history.  Family History  Problem Relation Age of Onset  . Cancer Mother        lung cancer  . Diabetes Other   . Hyperlipidemia Other   . Hypertension Other   . Cancer Other     Social History:  reports that she has quit smoking. She has never used smokeless tobacco. She reports that she drinks alcohol. Her drug history is not on file.  Allergies: No Known Allergies  Medications; Prior to Admission medications   Medication Sig Start Date End Date Taking? Authorizing Provider  Aspirin-Acetaminophen-Caffeine (GOODY HEADACHE PO) Take 2 Packages by mouth 2 (two) times daily as needed (headache).   Yes [provider]  Cyanocobalamin (VITAMIN B-12) 1000 MCG/15ML LIQD Take 15 mLs by mouth daily.   Yes [provider]  clobetasol cream (TEMOVATE) 9.32 % Apply 1 application topically 2 (two) times  daily. 14 days Patient not taking: Reported on 06/19/2018 12/14/17   [name], [name], MD  lisinopril-hydrochlorothiazide ([place]) 20-25 MG tablet TAKE 1 TABLET BY MOUTH EVERY DAY *FOLLOW UP APPOINTMENT NEEDED* Patient not taking: Reported on 06/19/2018 11/05/17   [name], [name], MD    PRN Meds  Results for orders placed or performed during the hospital encounter of 06/19/18 (from the past 48 hour(s))  ABO/Rh     Status: None   Collection Time: 06/19/18  8:27 AM  Result Value Ref Range   ABO/RH(D)      A POS Performed at [facility] [address] [place], [place] [address]   Comprehensive metabolic panel     Status: Abnormal   Collection Time: 06/19/18  8:36 AM  Result Value Ref Range   Sodium 139 135 - 145 mmol/L   Potassium 4.1 3.5 - 5.1 mmol/L   Chloride 112 (H) 98 - 111 mmol/L   CO2 20 (L) 22 - 32 mmol/L   Glucose, Bld 149 (H) 70 - 99 mg/dL   BUN 28 (H) 8 - 23 mg/dL   Creatinine, Ser 0.74 0.44 - 1.00 mg/dL   Calcium 7.8 (L) 8.9 - 10.3 mg/dL   Total Protein 4.9 (L) 6.5 - 8.1 g/dL   Albumin 2.6 (L) 3.5 - 5.0 g/dL   AST 15 15 - 41 U/L   ALT 14 0 - 44 U/L   Alkaline Phosphatase 37 (L) 38 - 126 U/L  Total Bilirubin 0.4 0.3 - 1.2 mg/dL   GFR calc non Af Amer >60 >60 mL/min   GFR calc Af Amer >60 >60 mL/min   Anion gap 7 5 - 15    Comment: Performed at [facility], [place] [address] [place], [place] [address]  CBC     Status: Abnormal   Collection Time: 06/19/18  8:36 AM  Result Value Ref Range   WBC 9.9 4.0 - 10.5 K/uL   RBC 3.13 (L) 3.87 - 5.11 MIL/uL   Hemoglobin 10.1 (L) 12.0 - 15.0 g/dL   HCT 33.6 (L) 36.0 - 46.0 %   MCV 107.3 (H) 80.0 - 100.0 fL   MCH 32.3 26.0 - 34.0 pg   MCHC 30.1 30.0 - 36.0 g/dL   RDW 14.5 11.5 - 15.5 %   Platelets 179 150 - 400 K/uL   nRBC 0.0 0.0 - 0.2 %    Comment: Performed at [facility], [place] [address] [place], [place] [address]  Type and screen [place]     Status: None   Collection Time: 06/19/18  8:36 AM  Result Value Ref Range   ABO/RH(D) A POS    Antibody Screen NEG    Sample Expiration      06/22/2018 Performed at [facility], [place] [address] [place], [place] [address]   Acetaminophen level     Status: Abnormal   Collection Time: 06/19/18  8:36 AM  Result Value Ref Range   Acetaminophen (Tylenol), Serum <10 (L) 10 - 30 ug/mL    Comment: (NOTE) Therapeutic concentrations vary significantly. A range of 10-30 ug/mL  may be an effective concentration for many patients. However, some  are best treated at concentrations outside of this range. Acetaminophen concentrations >150 ug/mL at 4 hours after ingestion  and >50 ug/mL at 12 hours after ingestion are often associated with  toxic reactions. Performed at [facility], [place] [address], [place], [place] [address]   Protime-INR     Status: None   Collection Time: 06/19/18  8:36 AM  Result Value Ref Range   Prothrombin Time 13.2 11.4 - 15.2 seconds   INR 1.01     Comment: Performed at [facility], [place] [address], [place], [place] [address]  Salicylate level     Status: None   Collection Time: 06/19/18  8:36 AM  Result Value Ref Range   Salicylate Lvl <4.7 2.8 - 30.0 mg/dL    Comment: Performed at [facility], [place] [address], [place], [place] [address]  Ammonia     Status: None   Collection Time: 06/19/18  8:49 AM  Result Value Ref Range   Ammonia 16 9 - 35 umol/L    Comment: Performed at [facility], [place] [address], [place], [place] [address]  POC occult blood, ED     Status: Abnormal   Collection Time: 06/19/18 10:15 AM  Result Value Ref Range   Fecal Occult Bld POSITIVE (A) NEGATIVE    No results found. ROS: Constitutional: Chronic pain and severe psoriasis.  No weight loss etc.  HEENT: No sore throats or visual disturbances. Cardiovascular: No  cardiovascular history. Respiratory: Denies any prior history of COPD coughing etc. GI: Denies prior ulcers or abdominal pain GU: Negative Musculoskeletal: Severe arthritis of questionable cause. Neuro/Psychiatric: Other than chronic pain occasionally radiating down the legs from her back she has no neurological symptoms. Endocrine/Heme: Negative  Blood pressure (!) 149/98, pulse 86, resp. rate 17, height 5\' 5"  (1.651 m), weight 97.5 kg, SpO2 96 %.  Physical exam:   General--Pleasant white female in no acute distress ENT--nonicteric dry mucous membranes no coffee-ground material in the throat  Neck--supple no lymphadenopathy Heart--regular rate and rhythm without murmurs or gallops Lungs--clear Abdomen--soft and completely nontender Psych--alert and oriented answers questions appropriately   Assessment: 1.  Melena.  This is most consistent with ulcer disease secondary to chronic Goody powders use 2.  Chronic pain due to severe osteoarthritis  Plan: We will proceed with EGD later this morning to evaluate for NSAID induced ulcer.  Have discussed this with the patient and she is agreeable.  We will go ahead and empirically treat her with PPI.   Nancy Fetter 06/19/2018, 1:07 PM   This note was created using voice recognition software and minor errors may Have occurred unintentionally. Pager: [phone] If no answer or after hours call [phone]

## 2018-06-19 NOTE — ED Triage Notes (Signed)
She reports having "dark red bloody stools x 3 this morning. She states she has no hx of this. She also tells [place] she has chronic pain and "I use Goody Powders". She arrives to hospital in no distress.

## 2018-06-19 NOTE — ED Provider Notes (Signed)
[facility] DEPT Provider Note   CSN: [id number] Arrival date & time: 06/19/18  0756     History   Chief Complaint Chief Complaint  Patient presents with  . Rectal Bleeding  . Pain    HPI [name] is a 67 y.o. female.  The history is provided by the patient and medical records. No language interpreter was used.  Rectal Bleeding  Quality:  Black and tarry and maroon Amount:  Copious Duration:  1 day Timing:  Constant Chronicity:  New Context: spontaneously   Context: not hemorrhoids and not rectal pain   Similar prior episodes: no   Relieved by:  Nothing Worsened by:  Nothing Ineffective treatments:  None tried Associated symptoms: light-headedness   Associated symptoms: no abdominal pain, no dizziness, no fever, no hematemesis, no loss of consciousness, no recent illness and no vomiting   Risk factors: NSAID use     Past Medical History:  Diagnosis Date  . Chronic pain   . Hyperlipidemia   . Hypertension     Patient Active Problem List   Diagnosis Date Noted  . Psoriasis 12/14/2017  . Knee pain, right 12/14/2017  . BMI 32.0-32.9,adult 12/15/2011  . Underinsured 12/15/2011  . HYPERLIPIDEMIA 03/26/2007  . Essential hypertension 03/26/2007    History reviewed. No pertinent surgical history.   OB History   None      Home Medications    Prior to Admission medications   Medication Sig Start Date End Date Taking? Authorizing Provider  clobetasol cream (TEMOVATE) 3.76 % Apply 1 application topically 2 (two) times daily. 14 days 12/14/17   [name], [name], MD  Astrid Drafts CAPS Take by mouth.    [provider]  lisinopril-hydrochlorothiazide (PRINZIDE,ZESTORETIC) 20-25 MG tablet TAKE 1 TABLET BY MOUTH EVERY DAY *FOLLOW UP APPOINTMENT NEEDED* 11/05/17   [name], [name], MD    Family History Family History  Problem Relation Age of Onset  . Cancer Mother        lung cancer  . Diabetes Other   .  Hyperlipidemia Other   . Hypertension Other   . Cancer Other     Social History Social History   Tobacco Use  . Smoking status: Former Research scientist (life sciences)  . Smokeless tobacco: Never Used  Substance Use Topics  . Alcohol use: Yes    Comment: wine 2 glasses a day   . Drug use: Not on file    Comment: Hemp Oil     Allergies   Patient has no known allergies.   Review of Systems Review of Systems  Constitutional: Positive for fatigue. Negative for chills, diaphoresis and fever.  HENT: Negative for congestion.   Eyes: Negative for visual disturbance.  Respiratory: Negative for cough, chest tightness, shortness of breath and wheezing.   Cardiovascular: Negative for chest pain, palpitations and leg swelling.  Gastrointestinal: Positive for hematochezia. Negative for abdominal pain, constipation, diarrhea, hematemesis, nausea and vomiting.  Genitourinary: Negative for dysuria, flank pain and frequency.  Musculoskeletal: Negative for back pain (chronic), neck pain and neck stiffness.  Skin: Positive for pallor. Negative for rash and wound.  Neurological: Positive for light-headedness. Negative for dizziness, loss of consciousness, weakness, numbness and headaches.  Psychiatric/Behavioral: Negative for agitation.  All other systems reviewed and are negative.    Physical Exam Updated Vital Signs BP 124/76   Pulse 94   Temp 98.1 F (36.7 C) (Oral)   Resp 17   Ht 5\' 5"  (1.651 m)   Wt 97.5 kg  SpO2 97%   BMI 35.78 kg/m   Physical Exam  Constitutional: She is oriented to person, place, and time. She appears well-developed and well-nourished. No distress.  HENT:  Head: Normocephalic and atraumatic.  Mouth/Throat: Oropharynx is clear and moist. No oropharyngeal exudate.  Eyes: Pupils are equal, round, and reactive to light. EOM are normal.  Neck: Normal range of motion. Neck supple.  Cardiovascular: Normal rate and regular rhythm.  No murmur heard. Pulmonary/Chest: Effort normal and  breath sounds normal. No respiratory distress. She has no wheezes. She has no rales. She exhibits no tenderness.  Abdominal: Soft. She exhibits no distension. There is no tenderness.  Musculoskeletal: She exhibits no edema or tenderness.  Lymphadenopathy:    She has no cervical adenopathy.  Neurological: She is alert and oriented to person, place, and time. No sensory deficit. She exhibits normal muscle tone.  Skin: Skin is warm and dry. Capillary refill takes less than 2 seconds. No rash noted. She is not diaphoretic. No erythema. There is pallor.  Psychiatric: She has a normal mood and affect.  Nursing note and vitals reviewed.    ED Treatments / Results  Labs (all labs ordered are listed, but only abnormal results are displayed) Labs Reviewed  COMPREHENSIVE METABOLIC PANEL - Abnormal; Notable for the following components:      Result Value   Chloride 112 (*)    CO2 20 (*)    Glucose, Bld 149 (*)    BUN 28 (*)    Calcium 7.8 (*)    Total Protein 4.9 (*)    Albumin 2.6 (*)    Alkaline Phosphatase 37 (*)    All other components within normal limits  CBC - Abnormal; Notable for the following components:   RBC 3.13 (*)    Hemoglobin 10.1 (*)    HCT 33.6 (*)    MCV 107.3 (*)    All other components within normal limits  ACETAMINOPHEN LEVEL - Abnormal; Notable for the following components:   Acetaminophen (Tylenol), Serum <10 (*)    All other components within normal limits  POC OCCULT BLOOD, ED - Abnormal; Notable for the following components:   Fecal Occult Bld POSITIVE (*)    All other components within normal limits  AMMONIA  PROTIME-INR  SALICYLATE LEVEL  HIV ANTIBODY (ROUTINE TESTING W REFLEX)  HEMOGLOBIN AND HEMATOCRIT, BLOOD  HEMOGLOBIN AND HEMATOCRIT, BLOOD  HEMOGLOBIN AND HEMATOCRIT, BLOOD  TYPE AND SCREEN  ABO/RH  SURGICAL PATHOLOGY    EKG EKG Interpretation  Date/Time:  Wednesday June 19 2018 09:13:38 EST Ventricular Rate:  81 PR Interval:    QRS  Duration: 106 QT Interval:  407 QTC Calculation: 473 R Axis:   70 Text Interpretation:  Sinus rhythm Borderline T wave abnormalities When compared to prior, no significant changes seen.  No STEMI Confirmed by Antony Blackbird [phone]) on 06/19/2018 9:33:53 AM   Radiology No results found.  Procedures Procedures (including critical care time)  Medications Ordered in ED Medications  ondansetron (ZOFRAN) tablet 4 mg (has no administration in time range)    Or  ondansetron (ZOFRAN) injection 4 mg (has no administration in time range)  acetaminophen (TYLENOL) tablet 650 mg (has no administration in time range)  pantoprazole (PROTONIX) injection 40 mg (has no administration in time range)  dextrose 5 %-0.9 % sodium chloride infusion ( Intravenous New Bag/Given 06/19/18 1634)  sodium chloride 0.9 % bolus 1,000 mL (0 mLs Intravenous Stopped 06/19/18 1211)     Initial Impression / Assessment and Plan /  ED Course  I have reviewed the triage vital signs and the nursing notes.  Pertinent labs & imaging results that were available during my care of the patient were reviewed by me and considered in my medical decision making (see chart for details).     [name] is a 67 y.o. female with a past medical history significant for chronic muscular skeletal pains who presents with fatigue and rectal bleeding.  Patient reports that she has a long history of extreme sports and has had chronic pains over the years.  She reports that she has been increasing her Goody powder use significantly over the last few weeks and over the last several days.  She reports using approximately 4 packs of Goody powder yesterday.  She reports that she has been feeling tired over the last few days and then overnight had 3 episodes of dark bloody stools.  She reports she feels pale and fatigued and on arrival to the emergency department had a blood pressure of 83 systolic.  Fluids were started.   Patient denies any chest  pain, shortness breath or palpitations.  She denies any urinary symptoms.  She denies fevers, chills, or recent trauma.  She is concerned that she may have caused herself an ulcer with her Gabriel Earing powders.  She has never had any rectal bleeding, hemorrhoid troubles, or ulcers in the past.  She denies significant abdominal pain but thinks it feels slightly bloated.  On exam, abdomen is nontender.  Back is diffusely tender which he reports is her baseline.  Lungs are clear and chest is nontender.  Patient has symmetric pulses in all extremities.  No focal neurologic deficits are observed.  Patient does appear pale.  Fecal occult test will be obtained as well as rectal exam.  Patient will have lab testing including serial hemoglobins.  Due to the Tylenol content and Goody powder, will obtain liver function testing with ammonia and INR as well as Tylenol level.  Anticipate reassessment with likely admission for symptomatic rectal bleeding and transient hypotension.  10:07 AM Initial blood test and begin return.  Initial hemoglobin is 10.1 and anemic.  Patient has elevated BUN but normal kidney function.  Low calcium.  Ammonia was normal, salicylate was undetectable as was Tylenol.  Doubt accidental overdose with Tylenol or salicylate.  INR was normal.  Liver function was not elevated.  Rectal exam showed gross dark blood.  Fecal occult test was sent.  Due to the patient's lightheadedness, hypotension on arrival, and ongoing bleeding with anemia, GI will be called and patient limited to hospital service for hemoglobin monitoring and blood pressure monitoring.   Final Clinical Impressions(s) / ED Diagnoses   Final diagnoses:  Symptomatic anemia  Rectal bleeding    ED Discharge Orders    None     Clinical Impression: 1. Symptomatic anemia   2. Rectal bleeding     Disposition: Admit  This note was prepared with assistance of Dragon voice recognition software. Occasional wrong-word or  sound-a-like substitutions may have occurred due to the inherent limitations of voice recognition software.     Tegeler, Gwenyth Allegra, MD 06/19/18 1650

## 2018-06-19 NOTE — ED Notes (Signed)
ED TO INPATIENT HANDOFF REPORT  Name/Age/Gender [name] 67 y.o. female  Code Status   Home/SNF/Other Home  Chief Complaint rectal bleeding  Level of Care/Admitting Diagnosis ED Disposition    ED Disposition Condition Comment   Admit  Hospital Area: [facility] [[id number]]  Level of Care: Telemetry [5]  Admit to tele based on following criteria: Other see comments  Comments: bleeding  Diagnosis: Upper GI bleed [035009]  Admitting Physician: Tawni Millers [[id number]]  Attending Physician: Tawni Millers [[id number]]  Estimated length of stay: 3 - 4 days  Certification:: I certify this patient will need inpatient services for at least 2 midnights  PT Class (Do Not Modify): Inpatient [101]  PT Acc Code (Do Not Modify): Private [1]       Medical History Past Medical History:  Diagnosis Date  . Chronic pain   . Hyperlipidemia   . Hypertension     Allergies No Known Allergies  IV Location/Drains/Wounds Patient Lines/Drains/Airways Status   Active Line/Drains/Airways    Name:   Placement date:   Placement time:   Site:   Days:   Peripheral IV 06/19/18 Left;Lateral Wrist   06/19/18    0750    Wrist   less than 1          Labs/Imaging Results for orders placed or performed during the hospital encounter of 06/19/18 (from the past 48 hour(s))  ABO/Rh     Status: None   Collection Time: 06/19/18  8:27 AM  Result Value Ref Range   ABO/RH(D)      A POS Performed at [facility], [place] [employer] [address], [place], [place] [address]   Comprehensive metabolic panel     Status: Abnormal   Collection Time: 06/19/18  8:36 AM  Result Value Ref Range   Sodium 139 135 - 145 mmol/L   Potassium 4.1 3.5 - 5.1 mmol/L   Chloride 112 (H) 98 - 111 mmol/L   CO2 20 (L) 22 - 32 mmol/L   Glucose, Bld 149 (H) 70 - 99 mg/dL   BUN 28 (H) 8 - 23 mg/dL   Creatinine, Ser 0.74 0.44 - 1.00 mg/dL   Calcium 7.8 (L) 8.9 - 10.3 mg/dL   Total Protein 4.9 (L) 6.5 - 8.1 g/dL   Albumin 2.6 (L) 3.5 - 5.0 g/dL   AST 15 15 - 41 U/L   ALT 14 0 - 44 U/L   Alkaline Phosphatase 37 (L) 38 - 126 U/L   Total Bilirubin 0.4 0.3 - 1.2 mg/dL   GFR calc non Af Amer >60 >60 mL/min   GFR calc Af Amer >60 >60 mL/min   Anion gap 7 5 - 15    Comment: Performed at [facility], [place] [address], [place], [place] [address]  CBC     Status: Abnormal   Collection Time: 06/19/18  8:36 AM  Result Value Ref Range   WBC 9.9 4.0 - 10.5 K/uL   RBC 3.13 (L) 3.87 - 5.11 MIL/uL   Hemoglobin 10.1 (L) 12.0 - 15.0 g/dL   HCT 33.6 (L) 36.0 - 46.0 %   MCV 107.3 (H) 80.0 - 100.0 fL   MCH 32.3 26.0 - 34.0 pg   MCHC 30.1 30.0 - 36.0 g/dL   RDW 14.5 11.5 - 15.5 %   Platelets 179 150 - 400 K/uL   nRBC 0.0 0.0 - 0.2 %    Comment: Performed at [facility], [address] [place],  [place] [address]  Type and screen [place]     Status: None   Collection Time: 06/19/18  8:36 AM  Result Value Ref Range   ABO/RH(D) A POS    Antibody Screen NEG    Sample Expiration      06/22/2018 Performed at [facility], [place] [address] [place], [place] [address]   Acetaminophen level     Status: Abnormal   Collection Time: 06/19/18  8:36 AM  Result Value Ref Range   Acetaminophen (Tylenol), Serum <10 (L) 10 - 30 ug/mL    Comment: (NOTE) Therapeutic concentrations vary significantly. A range of 10-30 ug/mL  may be an effective concentration for many patients. However, some  are best treated at concentrations outside of this range. Acetaminophen concentrations >150 ug/mL at 4 hours after ingestion  and >50 ug/mL at 12 hours after ingestion are often associated with  toxic reactions. Performed at [facility], [place] [address] [place], [place] [address]   Protime-INR     Status: None   Collection Time: 06/19/18  8:36 AM  Result Value Ref Range   Prothrombin Time 13.2 11.4  - 15.2 seconds   INR 1.01     Comment: Performed at [facility], [place] [address], [place], [place] [address]  Salicylate level     Status: None   Collection Time: 06/19/18  8:36 AM  Result Value Ref Range   Salicylate Lvl <0.7 2.8 - 30.0 mg/dL    Comment: Performed at [facility], [place] [address] [place], [place] [address]  Ammonia     Status: None   Collection Time: 06/19/18  8:49 AM  Result Value Ref Range   Ammonia 16 9 - 35 umol/L    Comment: Performed at [facility], [place] [address] [place], [place] [address]  POC occult blood, ED     Status: Abnormal   Collection Time: 06/19/18 10:15 AM  Result Value Ref Range   Fecal Occult Bld POSITIVE (A) NEGATIVE   No results found.  Pending Labs [employer] (From admission, onward)    Start     Ordered   Signed and Held  HIV antibody (Routine Testing)  Once,   R     Signed and Held   Signed and Occupational hygienist morning,   R     Signed and Held   Signed and Held  CBC  Tomorrow morning,   R     Signed and Held   Signed and Held  Hemoglobin and hematocrit, blood  Now then every 6 hours,   R     Signed and Held          Vitals/Pain Today's Vitals   06/19/18 0900 06/19/18 1015 06/19/18 1201 06/19/18 1230  BP: 118/77 (!) 142/93 (!) 144/78 (!) 149/98  Pulse: 84 82 89 86  Resp: 18 17 (!) 25 17  SpO2: 95% 98% 98% 96%  Weight:      Height:      PainSc:        Isolation Precautions No active isolations  Medications Medications  sodium chloride 0.9 % bolus 1,000 mL (0 mLs Intravenous Stopped 06/19/18 1211)    Mobility walks

## 2018-06-19 NOTE — H&P (Signed)
History and Physical    MIKO MARKWOOD WNI:[id number] DOB: Dec 04, 1950 DOA: 06/19/2018  PCP: Kennyth Arnold, FNP   Patient coming from: Home   Chief Complaint: Bloody stools and abdominal distention.   HPI: [name] is a 67 y.o. female with medical history significant of psoriasis, osteoarthritis, in the neck, lower back and right knee.  Patient presented with 3-4 episodes of dark/bloody stools that occurred early this morning.  Around 5 AM in the morning she went to the restroom due to significant abdominal bloating, she moved her bowels and passed gas, with mild improvement of her symptoms.  She noted her stools to have dark blood color.  She had about 3-4 episodes of bloody stools, with no improving or worsening factors, associated with a near syncope episode while trying to stand up from the commode.  She has been taking Goody powders for the last 3 weeks on a daily basis, about 4 tablets/day for her worsening joint pain in her neck, lower back and right knee.  Last meal was last night, denies any abdominal pain or hematemesis.    ED Course: She was found hypotensive, ill looking appearing and received IV fluids, her hemoglobin resulted in 10.1/hematocrit 33.6, gastroenterology was consulted.  Patient is referred to admission and further evaluation.  Review of Systems:  1. General: No fevers, no chills, no weight gain or weight loss 2. ENT: No runny nose or sore throat, no hearing disturbances 3. Pulmonary: No dyspnea, cough, wheezing, or hemoptysis 4. Cardiovascular: No angina, claudication, lower extremity edema, pnd or orthopnea 5. Gastrointestinal: No nausea or vomiting, no diarrhea or constipation/ positive abdominal bloating as mentioned in the HPI.  6. Hematology: No easy bruisability or frequent infections 7. Urology: No dysuria, hematuria or increased urinary frequency 8. Dermatology: positive anterior leg erythematous, this calmative lesions. 9. Neurology: No  seizures or paresthesias 10. Musculoskeletal: No joint pain or deformities  Past Medical History:  Diagnosis Date  . Chronic pain   . Hyperlipidemia   . Hypertension     History reviewed. No pertinent surgical history.   reports that she has quit smoking. She has never used smokeless tobacco. She reports that she drinks alcohol. Her drug history is not on file.  No Known Allergies  Family History  Problem Relation Age of Onset  . Cancer Mother        lung cancer  . Diabetes Other   . Hyperlipidemia Other   . Hypertension Other   . Cancer Other      Prior to Admission medications   Medication Sig Start Date End Date Taking? Authorizing Provider  clobetasol cream (TEMOVATE) 3.81 % Apply 1 application topically 2 (two) times daily. 14 days 12/14/17   [name], [name], MD  Astrid Drafts CAPS Take by mouth.    [provider]  lisinopril-hydrochlorothiazide (PRINZIDE,ZESTORETIC) 20-25 MG tablet TAKE 1 TABLET BY MOUTH EVERY DAY *FOLLOW UP APPOINTMENT NEEDED* 11/05/17   [name], [name], MD    Physical Exam: Vitals:   06/19/18 0805 06/19/18 0815 06/19/18 0845 06/19/18 0900  BP:  117/84 110/66 118/77  Pulse:  85 87 84  Resp:  20 (!) 25 18  SpO2:  99% 99% 95%  Weight: 97.5 kg     Height: 5\' 5"  (1.651 m)       Vitals:   06/19/18 0805 06/19/18 0815 06/19/18 0845 06/19/18 0900  BP:  117/84 110/66 118/77  Pulse:  85 87 84  Resp:  20 (!) 25 18  SpO2:  99% 99% 95%  Weight: 97.5 kg     Height: 5\' 5"  (1.651 m)      General: deconditioned and ill looking appearing  Neurology: Awake and alert, non focal Head and Neck. Head normocephalic. Neck supple with no adenopathy or thyromegaly.   E ENT: positive pallor, no icterus, oral mucosa dry Cardiovascular: No JVD. S1-S2 present, rhythmic, no gallops, rubs, or murmurs. No lower extremity edema. Pulmonary: positive breath sounds bilaterally, adequate air movement, no scattered expiratory wheezing, with no rhonchi or  rales. Gastrointestinal. Abdomen protuberant, distended with no organomegaly, non tender, no rebound or guarding Skin. Desquamative erythematous lesions in the anterior legs bilaterally  Musculoskeletal: no joint deformities    Labs on Admission: I have personally reviewed following labs and imaging studies  CBC: Recent Labs  Lab 06/19/18 0836  WBC 9.9  HGB 10.1*  HCT 33.6*  MCV 107.3*  PLT 580   Basic Metabolic Panel: Recent Labs  Lab 06/19/18 0836  NA 139  K 4.1  CL 112*  CO2 20*  GLUCOSE 149*  BUN 28*  CREATININE 0.74  CALCIUM 7.8*   GFR: Estimated Creatinine Clearance: 81 mL/min (by C-G formula based on SCr of 0.74 mg/dL). Liver Function Tests: Recent Labs  Lab 06/19/18 0836  AST 15  ALT 14  ALKPHOS 37*  BILITOT 0.4  PROT 4.9*  ALBUMIN 2.6*   No results for input(s): LIPASE, AMYLASE in the last 168 hours. Recent Labs  Lab 06/19/18 0849  AMMONIA 16   Coagulation Profile: Recent Labs  Lab 06/19/18 0836  INR 1.01   Cardiac Enzymes: No results for input(s): CKTOTAL, CKMB, CKMBINDEX, TROPONINI in the last 168 hours. BNP (last 3 results) No results for input(s): PROBNP in the last 8760 hours. HbA1C: No results for input(s): HGBA1C in the last 72 hours. CBG: No results for input(s): GLUCAP in the last 168 hours. Lipid Profile: No results for input(s): CHOL, HDL, LDLCALC, TRIG, CHOLHDL, LDLDIRECT in the last 72 hours. Thyroid Function Tests: No results for input(s): TSH, T4TOTAL, FREET4, T3FREE, THYROIDAB in the last 72 hours. Anemia Panel: No results for input(s): VITAMINB12, FOLATE, FERRITIN, TIBC, IRON, RETICCTPCT in the last 72 hours. Urine analysis: No results found for: COLORURINE, APPEARANCEUR, LABSPEC, PHURINE, GLUCOSEU, HGBUR, BILIRUBINUR, KETONESUR, PROTEINUR, UROBILINOGEN, NITRITE, LEUKOCYTESUR  Radiological Exams on Admission: No results found.  EKG: Independently reviewed.  Normal sinus rhythm, normal axis, normal  intervals.  Assessment/Plan Active Problems:   Upper GI bleed  67 year old female who presents with 3 to 4 episodes of dark bloody stools, associated with abdominal bloating, and orthostatic symptoms.  She has been taking large amounts of Goody powders over the last 3 weeks for osteoarthritis of her neck, lower back and right knee.  On her initial physical examination her blood pressure 117/84, heart rate 85, respiratory rate 20, oxygen saturation 99%, positive pallor, dry mucous membranes, lungs clear to auscultation with scattered expiratory wheeze, heart S1-S2 present and rhythmic, no gallops, rubs or murmurs, abdomen distended but nontender, no rebound, no guarding, no lower extremity edema.  Sodium 139, potassium 4.1, chloride 112, bicarb 20, glucose 149, BUN 20, creatinine 0.74, white count 9.9, hemoglobin 10.1, hematocrit 33.6, platelets 179.  Hemoccult stool positive.  Patient will be admitted to the hospital with the working diagnosis of acute blood loss anemia due to upper GI bleed likely medication induced peptic ulcer disease.   1.  Acute blood loss anemia due to upper GI bleed.  Patient will be admitted to the medical  ward, she will be placed on a remote telemetry monitor, continue hemoglobin and hematocrit check every 6 hours, plan to transfuse PRBC in case of further decline in her hemoglobin/hematocrit, specially if hemoglobin goes below 8.  Patient will be placed on proton pump inhibitors intravenously twice daily.  Will keep patient nothing by mouth and will follow up with gastroenterology recommendations/ likely patient will need upper endoscopy.  Continue volume resuscitation with isotonic saline at 75 mL per hour.  Avoid further NSAIDs.  As needed antiemetics.  2.  Hypertension.  Will hold lisinopril/hydrochlorothiazide to avoid further hypotension.  Continue blood pressure monitoring.  3.  Osteoarthritis.  Will avoid further nonsteroidal anti-inflammatory agents, patient wants to  avoid use of narcotics.  Will use acetaminophen scheduled for now.  4.  Psoriasis.  Continue topical therapy with clobetasol cream.    DVT prophylaxis:  scd  Code Status: full  Family Communication: I spoke with patient's family at the bedside and all questions were addressed.   Disposition Plan:  Telemetry   Consults called:  GI   Admission status: Inpatient.    Sadhana Frater Gerome Apley MD [facility] Pager [phone]  If 7PM-7AM, please contact night-coverage [email or web address] Password [email or web address]  06/19/2018, 10:18 AM

## 2018-06-19 NOTE — ED Notes (Signed)
Bed: RESA Expected date:  Expected time:  Means of arrival:  Comments: EMS/rectal bleed

## 2018-06-20 ENCOUNTER — Encounter (HOSPITAL_COMMUNITY): Payer: Self-pay | Admitting: Gastroenterology

## 2018-06-20 DIAGNOSIS — K625 Hemorrhage of anus and rectum: Secondary | ICD-10-CM | POA: Diagnosis not present

## 2018-06-20 DIAGNOSIS — K922 Gastrointestinal hemorrhage, unspecified: Secondary | ICD-10-CM | POA: Diagnosis not present

## 2018-06-20 DIAGNOSIS — K264 Chronic or unspecified duodenal ulcer with hemorrhage: Secondary | ICD-10-CM | POA: Diagnosis not present

## 2018-06-20 LAB — CBC
HEMATOCRIT: 26.6 % — AB (ref 36.0–46.0)
Hemoglobin: 8.1 g/dL — ABNORMAL LOW (ref 12.0–15.0)
MCH: 33.1 pg (ref 26.0–34.0)
MCHC: 30.5 g/dL (ref 30.0–36.0)
MCV: 108.6 fL — AB (ref 80.0–100.0)
PLATELETS: 179 10*3/uL (ref 150–400)
RBC: 2.45 MIL/uL — ABNORMAL LOW (ref 3.87–5.11)
RDW: 14.6 % (ref 11.5–15.5)
WBC: 7.9 10*3/uL (ref 4.0–10.5)
nRBC: 0 % (ref 0.0–0.2)

## 2018-06-20 LAB — BASIC METABOLIC PANEL
Anion gap: 4 — ABNORMAL LOW (ref 5–15)
BUN: 16 mg/dL (ref 8–23)
CALCIUM: 8 mg/dL — AB (ref 8.9–10.3)
CHLORIDE: 112 mmol/L — AB (ref 98–111)
CO2: 24 mmol/L (ref 22–32)
CREATININE: 0.58 mg/dL (ref 0.44–1.00)
GFR calc Af Amer: >60 mL/min (ref >60)
GFR calc non Af Amer: >60 mL/min (ref >60)
Glucose, Bld: 125 mg/dL — ABNORMAL HIGH (ref 70–99)
Potassium: 3.8 mmol/L (ref 3.5–5.1)
Sodium: 140 mmol/L (ref 135–145)

## 2018-06-20 LAB — HIV ANTIBODY (ROUTINE TESTING W REFLEX): HIV Screen 4th Generation wRfx: NONREACTIVE

## 2018-06-20 MED ORDER — PANTOPRAZOLE SODIUM 40 MG PO TBEC
40.0000 mg | DELAYED_RELEASE_TABLET | Freq: Two times a day (BID) | ORAL | Status: DC
  Administered 2018-06-20: 40 mg via ORAL
  Filled 2018-06-20: qty 1

## 2018-06-20 MED ORDER — PANTOPRAZOLE SODIUM 40 MG PO TBEC: 40.0000 mg | DELAYED_RELEASE_TABLET | Freq: Two times a day (BID) | ORAL | 3 refills | Status: DC

## 2018-06-20 NOTE — Care Management Note (Signed)
Case Management Note  Patient Details  Name: [name] MRN: [id number] Date of Birth: 1951-01-09  Subjective/Objective:                  discharged  Action/Plan: Discussed dc goals with patient. Patient discharged to home with self-care.  No CM needs present at time of discharge.  Expected Discharge Date:  06/20/18               Expected Discharge Plan:     In-House Referral:     Discharge planning Services     Post Acute Care Choice:    Choice offered to:     DME Arranged:    DME Agency:     HH Arranged:    HH Agency:     Status of Service:     If discussed at [employer], dates discussed:    Additional Comments:  Leeroy Cha, RN 06/20/2018, 11:14 AM

## 2018-06-20 NOTE — Discharge Summary (Signed)
Physician Discharge Summary  [name] ZSW:[id number] DOB: 05/21/1951 DOA: 06/19/2018  PCP: Kennyth Arnold, FNP  Admit date: 06/19/2018 Discharge date: 06/20/2018  Admitted From: Home  Disposition:  Home   Recommendations for Outpatient Follow-up:  1. Follow up with PCP in 1-2 weeks 2. Follow up with GI in 2 weeks 3. Please repeat CBC in 1 month, check iron stores   Home Health: None  Equipment/Devices: None  Discharge Condition: Good  CODE STATUS: FULL Diet recommendation: Regular  Brief/Interim Summary: [name] is a 67 y.o. F with psoriasis, chronic arthritis pain who presented with several episodes of melena on day of admission, followed by near syncope.  Has been taking Goody's powders for 3 weeks on daily basis, several times per day.       PRINCIPAL HOSPITAL DIAGNOSIS: Bleeding NSAID associated gastric and duodenal ulcers    Discharge Diagnoses:   Upper GI bleed Presented with Hgb 10.  Had brown BM while in the ER, went to endoscopy that showed a few 24mm gastric ulcers, and two 40mm duodenal ulcers, none with stigmata of recent bleeding.  She had no more melena.  HR normal, able to ambulate without difficulty or dizziness.  Hgb stable overnight around 8 g/dL.  Patient eager for discharge, transitioned to oral PPI, has close follow up with GI, return precautions.   Acute blood loss anemia Started on iron at discharge  Hypertension Blood pressure elevated overnight.  Will resume home medication on discharge.  Psoriasis          Discharge Instructions  Discharge Instructions    Diet general   Complete by:  As directed    Discharge instructions   Complete by:  As directed    From Dr. Loleta Books: You were admitted with a bleeding ulcer You were started on a strong acid suppressing medicine (Pantoprazole/Protonix) to prevent this from continuing and allow it to heal You should call Dr. Oletta Lamas (this is the GI doctor, who did the endoscopy)  because it is important that you have follow up with him. His office number is: [phone]  You should stop all NSAIDs For pain, take acetaminophen/Tylenol 1000 mg up to three times daily  Follow up with your primary care doctor asap.  Resume your lisinopril-HCTZ for blood pressure.   Increase activity slowly   Complete by:  As directed      Allergies as of 06/20/2018   No Known Allergies     Medication List    STOP taking these medications   GOODY HEADACHE PO     TAKE these medications   clobetasol cream 0.05 % Commonly known as:  TEMOVATE Apply 1 application topically 2 (two) times daily. 14 days   lisinopril-hydrochlorothiazide 20-25 MG tablet Commonly known as:  PRINZIDE,ZESTORETIC TAKE 1 TABLET BY MOUTH EVERY DAY *FOLLOW UP APPOINTMENT NEEDED*   pantoprazole 40 MG tablet Commonly known as:  PROTONIX Take 1 tablet (40 mg total) by mouth 2 (two) times daily before a meal.   Vitamin B-12 1000 MCG/15ML Liqd Take 15 mLs by mouth daily.       No Known Allergies  Consultations:  Gastroenterology   Procedures/Studies:  No results found.    Subjective: Feeling fine.  No dizziness, weakness, presyncope.  No more melena.  No tachycardia, chest discomfort.  Discharge Exam: Vitals:   06/20/18 0410 06/20/18 0829  BP: (!) 152/83 140/78  Pulse: 79   Resp: (!) 21 19  Temp: (!) 97.5 F (36.4 C) 98.1 F (36.7 C)  SpO2: 99%    Vitals:   06/19/18 1640 06/19/18 2057 06/20/18 0410 06/20/18 0829  BP: 124/76 126/77 (!) 152/83 140/78  Pulse: 94 79 79   Resp: 17 (!) 21 (!) 21 19  Temp: 98.1 F (36.7 C) 98.2 F (36.8 C) (!) 97.5 F (36.4 C) 98.1 F (36.7 C)  TempSrc: Oral Oral Oral Oral  SpO2: 97% 95% 99%   Weight:      Height:        General: Pt is alert, awake, not in acute distress Cardiovascular: RRR, nl S1-S2, no murmurs appreciated.   No LE edema.   Respiratory: Normal respiratory rate and rhythm.  CTAB without rales or wheezes. Abdominal:  Abdomen soft and non-tender.  No distension or HSM.   Neuro/Psych: Strength symmetric in upper and lower extremities.  Judgment and insight appear normal.   The results of significant diagnostics from this hospitalization (including imaging, microbiology, ancillary and laboratory) are listed below for reference.     Microbiology: No results found for this or any previous visit (from the past 240 hour(s)).   Labs: BNP (last 3 results) No results for input(s): BNP in the last 8760 hours. Basic Metabolic Panel: Recent Labs  Lab 06/19/18 0836 06/20/18 0628  NA 139 140  K 4.1 3.8  CL 112* 112*  CO2 20* 24  GLUCOSE 149* 125*  BUN 28* 16  CREATININE 0.74 0.58  CALCIUM 7.8* 8.0*   Liver Function Tests: Recent Labs  Lab 06/19/18 0836  AST 15  ALT 14  ALKPHOS 37*  BILITOT 0.4  PROT 4.9*  ALBUMIN 2.6*   No results for input(s): LIPASE, AMYLASE in the last 168 hours. Recent Labs  Lab 06/19/18 0849  AMMONIA 16   CBC: Recent Labs  Lab 06/19/18 0836 06/19/18 1652 06/19/18 2333 06/20/18 0628  WBC 9.9  --   --  7.9  HGB 10.1* 9.3* 8.1* 8.1*  HCT 33.6* 30.3* 26.7* 26.6*  MCV 107.3*  --   --  108.6*  PLT 179  --   --  179   Cardiac Enzymes: No results for input(s): CKTOTAL, CKMB, CKMBINDEX, TROPONINI in the last 168 hours. BNP: Invalid input(s): POCBNP CBG: No results for input(s): GLUCAP in the last 168 hours. D-Dimer No results for input(s): DDIMER in the last 72 hours. Hgb A1c No results for input(s): HGBA1C in the last 72 hours. Lipid Profile No results for input(s): CHOL, HDL, LDLCALC, TRIG, CHOLHDL, LDLDIRECT in the last 72 hours. Thyroid function studies No results for input(s): TSH, T4TOTAL, T3FREE, THYROIDAB in the last 72 hours.  Invalid input(s): FREET3 Anemia work up No results for input(s): VITAMINB12, FOLATE, FERRITIN, TIBC, IRON, RETICCTPCT in the last 72 hours. Urinalysis No results found for: COLORURINE, APPEARANCEUR, LABSPEC, [place],  GLUCOSEU, HGBUR, BILIRUBINUR, KETONESUR, PROTEINUR, UROBILINOGEN, NITRITE, LEUKOCYTESUR Sepsis Labs Invalid input(s): PROCALCITONIN,  WBC,  LACTICIDVEN Microbiology No results found for this or any previous visit (from the past 240 hour(s)).   Time coordinating discharge: 40 minutes      SIGNED:   Edwin Dada, MD  [facility] 06/20/2018, 3:09 PM

## 2018-06-20 NOTE — Care Management Note (Signed)
Case Management Note  Patient Details  Name: [name] MRN: [id number] Date of Birth: 1950/11/17  Subjective/Objective:                  discharged  Action/Plan: Discharged to home with self-care, orders checked for hhc needs. No CM needs present at time of discharge.  Patient is able to arrangement own appointments and home care.  Expected Discharge Date:  06/20/18               Expected Discharge Plan:     In-House Referral:     Discharge planning Services     Post Acute Care Choice:    Choice offered to:     DME Arranged:    DME Agency:     HH Arranged:    HH Agency:     Status of Service:     If discussed at [employer], dates discussed:    Additional Comments:  Leeroy Cha, RN 06/20/2018, 10:48 AM

## 2018-06-20 NOTE — Progress Notes (Signed)
Patient has discharged to home on 06/20/18. Discharge instruction including medication and appointment was given to patient. Patient has no question at this time.

## 2018-06-20 NOTE — Progress Notes (Signed)
Was called by the hospitalist at this time.  The patient had ulcerations on EGD yesterday hemoglobin was stable overnight and the hospitalist felt comfortable sending her home.  I told him I would not be able to get over there for couple hours to see her and they stated that they would go ahead and send her home and follow-up with me in the next 3 to 4 weeks with instructions to avoid NSAIDs and discharge home PPI twice daily with instructions to call me for follow-up in 1 month.

## 2018-06-20 NOTE — Op Note (Signed)
[facility] Patient Name: [name] Procedure Date: 06/19/2018 MRN: [id number] Attending MD: Nancy Fetter Dr., MD Date of Birth: 11-28-1950 CSN: [id number] Age: 67 Admit Type: Emergency Department Procedure:                Upper GI endoscopy with biopsy Indications:              Melena and patient him has been taking a large                            quantity of Goody powders. Providers:                Joyice Faster. Urie Loughner Dr., MD, Cleda Daub, RN, Tinnie Gens, Technician, Arnoldo Hooker, CRNA Referring MD:              Medicines:                Monitored Anesthesia Care Complications:            No immediate complications. Estimated Blood Loss:     Estimated blood loss: none. Procedure:                Pre-Anesthesia Assessment:                           - Prior to the procedure, a History and Physical                            was performed, and patient medications and                            allergies were reviewed. The patient's tolerance of                            previous anesthesia was also reviewed. The risks                            and benefits of the procedure and the sedation                            options and risks were discussed with the patient.                            All questions were answered, and informed consent                            was obtained. Prior Anticoagulants: The patient has                            taken no previous anticoagulant or antiplatelet                            agents. ASA Grade Assessment: III - A patient with  severe systemic disease. After reviewing the risks                            and benefits, the patient was deemed in                            satisfactory condition to undergo the procedure.                           After obtaining informed consent, the endoscope was                            passed under direct vision. Throughout the                             procedure, the patient's blood pressure, pulse, and                            oxygen saturations were monitored continuously. The                            GIF-H190 ([id number]) Olympus adult endoscope was                            introduced through the mouth, and advanced to the                            second part of duodenum. The upper GI endoscopy was                            accomplished without difficulty. The patient                            tolerated the procedure well. Scope In: Scope Out: Findings:      One linear esophageal ulcer with no bleeding and no stigmata of recent       bleeding was found at the gastroesophageal junction.      Few non-bleeding linear gastric ulcers with no stigmata of bleeding were       found in the gastric antrum. The largest lesion was 2 mm in largest       dimension. Biopsies were taken with a cold forceps for Helicobacter       pylori testing.      Two non-bleeding superficial duodenal ulcers with no stigmata of       bleeding were found in the duodenal bulb. The largest lesion was 3 mm in       largest dimension. Impression:               - Non-bleeding esophageal ulcer.                           - Non-bleeding gastric ulcers with no stigmata of                            bleeding. Biopsied.                           -  Multiple non-bleeding duodenal ulcers with no                            stigmata of bleeding. Moderate Sedation:      See anesthesia note, no moderate sedation. Recommendation:           - Return patient to hospital ward for ongoing care.                           - Use Protonix (pantoprazole) 40 mg PO BID.                           - Full liquid diet.                           - Continue present medications. Procedure Code(s):        --- Professional ---                           [phone], Esophagogastroduodenoscopy, flexible,                            transoral; with biopsy, single or  multiple Diagnosis Code(s):        --- Professional ---                           K22.10, Ulcer of esophagus without bleeding                           K25.9, Gastric ulcer, unspecified as acute or                            chronic, without hemorrhage or perforation                           K26.9, Duodenal ulcer, unspecified as acute or                            chronic, without hemorrhage or perforation                           K92.1, Melena (includes Hematochezia) CPT copyright 2018 American Medical Association. All rights reserved. The codes documented in this report are preliminary and upon coder review may  be revised to meet current compliance requirements. Nancy Fetter Dr., MD 06/19/2018 3:57:09 PM This report has been signed electronically. Number of Addenda: 0

## 2018-06-21 ENCOUNTER — Emergency Department (HOSPITAL_COMMUNITY): Payer: Medicare Other

## 2018-06-21 ENCOUNTER — Ambulatory Visit (INDEPENDENT_AMBULATORY_CARE_PROVIDER_SITE_OTHER): Payer: Medicare Other

## 2018-06-21 ENCOUNTER — Other Ambulatory Visit: Payer: Self-pay

## 2018-06-21 ENCOUNTER — Ambulatory Visit: Payer: Medicare Other | Admitting: Family Medicine

## 2018-06-21 ENCOUNTER — Telehealth: Payer: Self-pay | Admitting: Family Medicine

## 2018-06-21 ENCOUNTER — Inpatient Hospital Stay (HOSPITAL_COMMUNITY)
Admission: EM | Admit: 2018-06-21 | Discharge: 2018-06-27 | DRG: 038 | Disposition: A | Discharge status: Home / Self Care | Payer: Medicare Other | Attending: Internal Medicine | Admitting: Internal Medicine

## 2018-06-21 ENCOUNTER — Encounter (HOSPITAL_COMMUNITY): Payer: Self-pay | Admitting: Internal Medicine

## 2018-06-21 ENCOUNTER — Encounter: Payer: Self-pay | Admitting: Family Medicine

## 2018-06-21 VITALS — BP 110/70 | HR 92 | Temp 98.0°F | Resp 20 | Ht 62.36 in | Wt 211.0 lb

## 2018-06-21 DIAGNOSIS — I639 Cerebral infarction, unspecified: Secondary | ICD-10-CM | POA: Diagnosis not present

## 2018-06-21 DIAGNOSIS — E876 Hypokalemia: Secondary | ICD-10-CM | POA: Diagnosis not present

## 2018-06-21 DIAGNOSIS — Z6837 Body mass index (BMI) 37.0-37.9, adult: Secondary | ICD-10-CM | POA: Diagnosis not present

## 2018-06-21 DIAGNOSIS — R42 Dizziness and giddiness: Secondary | ICD-10-CM | POA: Diagnosis not present

## 2018-06-21 DIAGNOSIS — K922 Gastrointestinal hemorrhage, unspecified: Secondary | ICD-10-CM | POA: Diagnosis not present

## 2018-06-21 DIAGNOSIS — I252 Old myocardial infarction: Secondary | ICD-10-CM

## 2018-06-21 DIAGNOSIS — R29898 Other symptoms and signs involving the musculoskeletal system: Secondary | ICD-10-CM

## 2018-06-21 DIAGNOSIS — I6521 Occlusion and stenosis of right carotid artery: Secondary | ICD-10-CM | POA: Diagnosis present

## 2018-06-21 DIAGNOSIS — I10 Essential (primary) hypertension: Secondary | ICD-10-CM | POA: Diagnosis not present

## 2018-06-21 DIAGNOSIS — K219 Gastro-esophageal reflux disease without esophagitis: Secondary | ICD-10-CM | POA: Diagnosis not present

## 2018-06-21 DIAGNOSIS — D62 Acute posthemorrhagic anemia: Secondary | ICD-10-CM | POA: Diagnosis present

## 2018-06-21 DIAGNOSIS — M25512 Pain in left shoulder: Secondary | ICD-10-CM | POA: Diagnosis not present

## 2018-06-21 DIAGNOSIS — G44319 Acute post-traumatic headache, not intractable: Secondary | ICD-10-CM

## 2018-06-21 DIAGNOSIS — I6340 Cerebral infarction due to embolism of unspecified cerebral artery: Secondary | ICD-10-CM | POA: Diagnosis not present

## 2018-06-21 DIAGNOSIS — Z79899 Other long term (current) drug therapy: Secondary | ICD-10-CM | POA: Diagnosis not present

## 2018-06-21 DIAGNOSIS — E669 Obesity, unspecified: Secondary | ICD-10-CM | POA: Diagnosis not present

## 2018-06-21 DIAGNOSIS — E785 Hyperlipidemia, unspecified: Secondary | ICD-10-CM | POA: Diagnosis present

## 2018-06-21 DIAGNOSIS — R918 Other nonspecific abnormal finding of lung field: Secondary | ICD-10-CM

## 2018-06-21 DIAGNOSIS — K5900 Constipation, unspecified: Secondary | ICD-10-CM | POA: Diagnosis not present

## 2018-06-21 DIAGNOSIS — F17210 Nicotine dependence, cigarettes, uncomplicated: Secondary | ICD-10-CM | POA: Diagnosis not present

## 2018-06-21 DIAGNOSIS — S0990XS Unspecified injury of head, sequela: Secondary | ICD-10-CM

## 2018-06-21 DIAGNOSIS — I739 Peripheral vascular disease, unspecified: Secondary | ICD-10-CM | POA: Diagnosis present

## 2018-06-21 DIAGNOSIS — M549 Dorsalgia, unspecified: Secondary | ICD-10-CM | POA: Diagnosis not present

## 2018-06-21 DIAGNOSIS — G8929 Other chronic pain: Secondary | ICD-10-CM | POA: Diagnosis not present

## 2018-06-21 LAB — POCT URINALYSIS DIP (MANUAL ENTRY)
Bilirubin, UA: NEGATIVE
Blood, UA: NEGATIVE
GLUCOSE UA: NEGATIVE mg/dL
Ketones, POC UA: NEGATIVE mg/dL
Leukocytes, UA: NEGATIVE
Nitrite, UA: NEGATIVE
Protein Ur, POC: NEGATIVE mg/dL
Spec Grav, UA: 1.02 (ref 1.010–1.025)
Urobilinogen, UA: 0.2 E.U./dL
pH, UA: 5 (ref 5.0–8.0)

## 2018-06-21 LAB — CBC WITH DIFFERENTIAL/PLATELET
Abs Immature Granulocytes: 0.04 10*3/uL (ref 0.00–0.07)
Basophils Absolute: 0 10*3/uL (ref 0.0–0.1)
Basophils Relative: 0 %
EOS ABS: 0.1 10*3/uL (ref 0.0–0.5)
Eosinophils Relative: 1 %
HCT: 26.3 % — ABNORMAL LOW (ref 36.0–46.0)
Hemoglobin: 8.2 g/dL — ABNORMAL LOW (ref 12.0–15.0)
Immature Granulocytes: 0 %
LYMPHS PCT: 17 %
Lymphs Abs: 1.6 10*3/uL (ref 0.7–4.0)
MCH: 32.9 pg (ref 26.0–34.0)
MCHC: 31.2 g/dL (ref 30.0–36.0)
MCV: 105.6 fL — ABNORMAL HIGH (ref 80.0–100.0)
MONO ABS: 0.5 10*3/uL (ref 0.1–1.0)
Monocytes Relative: 6 %
Neutro Abs: 7.1 10*3/uL (ref 1.7–7.7)
Neutrophils Relative %: 76 %
Platelets: 211 10*3/uL (ref 150–400)
RBC: 2.49 MIL/uL — ABNORMAL LOW (ref 3.87–5.11)
RDW: 14.6 % (ref 11.5–15.5)
WBC: 9.3 10*3/uL (ref 4.0–10.5)
nRBC: 0 % (ref 0.0–0.2)

## 2018-06-21 LAB — PROTIME-INR
INR: 0.9
Prothrombin Time: 12.1 seconds (ref 11.4–15.2)

## 2018-06-21 LAB — URINALYSIS, ROUTINE W REFLEX MICROSCOPIC
Bilirubin Urine: NEGATIVE
Glucose, UA: NEGATIVE mg/dL
HGB URINE DIPSTICK: NEGATIVE
Ketones, ur: NEGATIVE mg/dL
Leukocytes, UA: NEGATIVE
Nitrite: NEGATIVE
Protein, ur: NEGATIVE mg/dL
Specific Gravity, Urine: 1.01 (ref 1.005–1.030)
pH: 5.5 (ref 5.0–8.0)

## 2018-06-21 LAB — COMPREHENSIVE METABOLIC PANEL
ALT: 15 U/L (ref 0–44)
AST: 18 U/L (ref 15–41)
Albumin: 3.4 g/dL — ABNORMAL LOW (ref 3.5–5.0)
Alkaline Phosphatase: 45 U/L (ref 38–126)
Anion gap: 9 (ref 5–15)
BUN: 11 mg/dL (ref 8–23)
CO2: 26 mmol/L (ref 22–32)
Calcium: 8.8 mg/dL — ABNORMAL LOW (ref 8.9–10.3)
Chloride: 106 mmol/L (ref 98–111)
Creatinine, Ser: 0.76 mg/dL (ref 0.44–1.00)
GFR calc non Af Amer: >60 mL/min (ref >60)
Glucose, Bld: 106 mg/dL — ABNORMAL HIGH (ref 70–99)
Potassium: 3.6 mmol/L (ref 3.5–5.1)
Sodium: 141 mmol/L (ref 135–145)
Total Bilirubin: 0.6 mg/dL (ref 0.3–1.2)
Total Protein: 5.9 g/dL — ABNORMAL LOW (ref 6.5–8.1)

## 2018-06-21 LAB — POCT CBC
Granulocyte percent: 78.5 %G (ref 37–80)
HCT, POC: 25.5 % — AB (ref 29–41)
Hemoglobin: 8.5 g/dL — AB (ref 9.5–13.5)
Lymph, poc: 1.6 (ref 0.6–3.4)
MCH, POC: 32.9 pg — AB (ref 27–31.2)
MCHC: 33.3 g/dL (ref 31.8–35.4)
MCV: 98.9 fL (ref 76–111)
MID (CBC): 0.4 (ref 0–0.9)
MPV: 8.3 fL (ref 0–99.8)
POC Granulocyte: 7.3 — AB (ref 2–6.9)
POC LYMPH PERCENT: 17 %L (ref 10–50)
POC MID %: 4.5 % (ref 0–12)
Platelet Count, POC: 225 10*3/uL (ref 142–424)
RBC: 2.58 M/uL — AB (ref 4.04–5.48)
RDW, POC: 15.3 %
WBC: 9.3 10*3/uL (ref 4.6–10.2)

## 2018-06-21 LAB — TYPE AND SCREEN
ABO/RH(D): A POS
Antibody Screen: NEGATIVE

## 2018-06-21 LAB — TROPONIN I: Troponin I: <0.03 ng/mL (ref <0.03)

## 2018-06-21 LAB — CBG MONITORING, ED: Glucose-Capillary: 92 mg/dL (ref 70–99)

## 2018-06-21 LAB — GLUCOSE, POCT (MANUAL RESULT ENTRY): POC Glucose: 113 mg/dl — AB (ref 70–99)

## 2018-06-21 LAB — POC OCCULT BLOOD, ED: Fecal Occult Bld: POSITIVE — AB

## 2018-06-21 MED ORDER — PANTOPRAZOLE SODIUM 40 MG PO TBEC
40.0000 mg | DELAYED_RELEASE_TABLET | Freq: Two times a day (BID) | ORAL | Status: DC
  Administered 2018-06-22 – 2018-06-27 (×11): 40 mg via ORAL
  Filled 2018-06-21 (×12): qty 1

## 2018-06-21 MED ORDER — STROKE: EARLY STAGES OF RECOVERY BOOK
Freq: Once | Status: AC
  Administered 2018-06-22
  Filled 2018-06-21: qty 1

## 2018-06-21 MED ORDER — SODIUM CHLORIDE 0.9 % IV SOLN
INTRAVENOUS | Status: AC
  Administered 2018-06-21 – 2018-06-22 (×2): via INTRAVENOUS

## 2018-06-21 MED ORDER — ACETAMINOPHEN 325 MG PO TABS
650.0000 mg | ORAL_TABLET | ORAL | Status: DC | PRN
  Administered 2018-06-22 – 2018-06-27 (×6): 650 mg via ORAL
  Filled 2018-06-21 (×6): qty 2

## 2018-06-21 MED ORDER — SODIUM CHLORIDE 0.9 % IV BOLUS
500.0000 mL | Freq: Once | INTRAVENOUS | Status: AC
  Administered 2018-06-21: 500 mL via INTRAVENOUS

## 2018-06-21 MED ORDER — ACETAMINOPHEN 160 MG/5ML PO SOLN: 650.0000 mg | ORAL | Status: DC | PRN

## 2018-06-21 MED ORDER — MORPHINE SULFATE (PF) 4 MG/ML IV SOLN
4.0000 mg | Freq: Once | INTRAVENOUS | Status: AC
  Administered 2018-06-21: 4 mg via INTRAVENOUS
  Filled 2018-06-21: qty 1

## 2018-06-21 MED ORDER — ACETAMINOPHEN 650 MG RE SUPP: 650.0000 mg | RECTAL | Status: DC | PRN

## 2018-06-21 NOTE — ED Notes (Signed)
ED Provider at bedside. 

## 2018-06-21 NOTE — ED Notes (Signed)
ED TO INPATIENT HANDOFF REPORT  Name/Age/Gender [name] 67 y.o. female  Code Status Code Status History    Date Active Date Inactive Code Status Order ID Comments User Context   06/19/2018 1633 06/20/2018 1603 Full Code [id number]  Arrien, Jimmy Picket, MD Inpatient      Home/SNF/Other Home  Chief Complaint low hemoglobin  Level of Care/Admitting Diagnosis ED Disposition    ED Disposition Condition [place] [facility] Area: [place] [100100]  Level of Care: Telemetry [5]  I expect the patient will be discharged within 24 hours: No (not a candidate for 5C-Observation unit)  Diagnosis: Acute CVA (cerebrovascular accident) [facility]) [[id number]]  Admitting Physician: Rise Patience [phone]  Attending Physician: Rise Patience [email or web address]  PT Class (Do Not Modify): Observation [104]  PT Acc Code (Do Not Modify): Observation [10022]       Medical History Past Medical History:  Diagnosis Date  . Chronic pain   . Hyperlipidemia   . Hypertension     Allergies No Known Allergies  IV Location/Drains/Wounds Patient Lines/Drains/Airways Status   Active Line/Drains/Airways    Name:   Placement date:   Placement time:   Site:   Days:   Peripheral IV 06/21/18 Right Antecubital   06/21/18    1214    Antecubital   less than 1          Labs/Imaging Results for orders placed or performed during the hospital encounter of 06/21/18 (from the past 48 hour(s))  CBC with Differential     Status: Abnormal   Collection Time: 06/21/18 12:13 PM  Result Value Ref Range   WBC 9.3 4.0 - 10.5 K/uL   RBC 2.49 (L) 3.87 - 5.11 MIL/uL   Hemoglobin 8.2 (L) 12.0 - 15.0 g/dL   HCT 26.3 (L) 36.0 - 46.0 %   MCV 105.6 (H) 80.0 - 100.0 fL   MCH 32.9 26.0 - 34.0 pg   MCHC 31.2 30.0 - 36.0 g/dL   RDW 14.6 11.5 - 15.5 %   Platelets 211 150 - 400 K/uL   nRBC 0.0 0.0 - 0.2 %   Neutrophils Relative % 76 %   Neutro Abs 7.1 1.7 - 7.7 K/uL   Lymphocytes  Relative 17 %   Lymphs Abs 1.6 0.7 - 4.0 K/uL   Monocytes Relative 6 %   Monocytes Absolute 0.5 0.1 - 1.0 K/uL   Eosinophils Relative 1 %   Eosinophils Absolute 0.1 0.0 - 0.5 K/uL   Basophils Relative 0 %   Basophils Absolute 0.0 0.0 - 0.1 K/uL   Immature Granulocytes 0 %   Abs Immature Granulocytes 0.04 0.00 - 0.07 K/uL    Comment: Performed at [facility], [place] [address], [place], [place] [address]  Comprehensive metabolic panel     Status: Abnormal   Collection Time: 06/21/18 12:13 PM  Result Value Ref Range   Sodium 141 135 - 145 mmol/L   Potassium 3.6 3.5 - 5.1 mmol/L   Chloride 106 98 - 111 mmol/L   CO2 26 22 - 32 mmol/L   Glucose, Bld 106 (H) 70 - 99 mg/dL   BUN 11 8 - 23 mg/dL   Creatinine, Ser 0.76 0.44 - 1.00 mg/dL   Calcium 8.8 (L) 8.9 - 10.3 mg/dL   Total Protein 5.9 (L) 6.5 - 8.1 g/dL   Albumin 3.4 (L) 3.5 - 5.0 g/dL   AST 18 15 - 41 U/L   ALT 15 0 - 44  U/L   Alkaline Phosphatase 45 38 - 126 U/L   Total Bilirubin 0.6 0.3 - 1.2 mg/dL   GFR calc non Af Amer >60 >60 mL/min   GFR calc Af Amer >60 >60 mL/min   Anion gap 9 5 - 15    Comment: Performed at [facility], [place] [address], [place], [place] [address]  Protime-INR     Status: None   Collection Time: 06/21/18 12:13 PM  Result Value Ref Range   Prothrombin Time 12.1 11.4 - 15.2 seconds   INR 0.90     Comment: Performed at [facility], [place] [address], [place], [place] [address]  Type and screen [place]     Status: None   Collection Time: 06/21/18 12:13 PM  Result Value Ref Range   ABO/RH(D) A POS    Antibody Screen NEG    Sample Expiration      06/24/2018 Performed at [facility], [place] [address] [place], [place] [address]   Troponin I - Once     Status: None   Collection Time: 06/21/18 12:13 PM  Result Value Ref Range   Troponin I <0.03 <0.03 ng/mL    Comment: Performed at [facility], [place] [address], [place], [place] [address]  Urinalysis, Routine w reflex microscopic     Status: None   Collection Time: 06/21/18 12:14 PM  Result Value Ref Range   Color, Urine YELLOW YELLOW   APPearance CLEAR CLEAR   Specific Gravity, Urine 1.010 1.005 - 1.030   pH 5.5 5.0 - 8.0   Glucose, UA NEGATIVE NEGATIVE mg/dL   Hgb urine dipstick NEGATIVE NEGATIVE   Bilirubin Urine NEGATIVE NEGATIVE   Ketones, ur NEGATIVE NEGATIVE mg/dL   Protein, ur NEGATIVE NEGATIVE mg/dL   Nitrite NEGATIVE NEGATIVE   Leukocytes, UA NEGATIVE NEGATIVE    Comment: Microscopic not done on urines with negative protein, blood, leukocytes, nitrite, or glucose < 500 mg/dL. Performed at [facility], [place] [address] [place], [place] [address]   CBG monitoring, ED     Status: None   Collection Time: 06/21/18  1:28 PM  Result Value Ref Range   Glucose-Capillary 92 70 - 99 mg/dL  POC occult blood, ED     Status: Abnormal   Collection Time: 06/21/18  1:36 PM  Result Value Ref Range   Fecal Occult Bld POSITIVE (A) NEGATIVE   Ct Head Wo Contrast  Result Date: 06/21/2018 CLINICAL DATA:  Generalized weakness and LEFT shoulder pain from previous fall. Focal neuro deficit, greater than 6 hours. EXAM: CT HEAD WITHOUT CONTRAST TECHNIQUE: Contiguous axial images were obtained from the base of the skull through the vertex without intravenous contrast. COMPARISON:  None. FINDINGS: Brain: Mild generalized parenchymal volume loss with commensurate dilatation of the ventricles and sulci. Patchy small low-density areas within the bilateral periventricular and subcortical white matter regions, compatible with chronic small vessel ischemia. No mass, hemorrhage, edema or other evidence of acute parenchymal abnormality. No extra-axial hemorrhage. Vascular: Chronic calcified atherosclerotic changes of the large vessels at the skull base. No unexpected hyperdense vessel. Skull: Normal. Negative for fracture or  focal lesion. Sinuses/Orbits: No acute finding. Other: None. IMPRESSION: 1. No acute findings. No intracranial mass, hemorrhage or edema. 2. Mild chronic small vessel ischemic changes in the white matter. Electronically Signed   By: Franki Cabot M.D.   On: 06/21/2018 12:52   Mr Brain Wo Contrast (neuro Protocol)  Result Date: 06/21/2018  CLINICAL DATA:  Generalized weakness. Left shoulder pain. Previous fall. EXAM: MRI HEAD WITHOUT CONTRAST TECHNIQUE: Multiplanar, multiecho pulse sequences of the brain and surrounding structures were obtained without intravenous contrast. COMPARISON:  Head CT same day FINDINGS: Brain: Numerous small acute infarctions scattered throughout the right cerebral hemisphere in a pattern most consistent with watershed infarction. The differential diagnosis includes micro embolic infarctions. No large or confluent infarction. No swelling or hemorrhage. Elsewhere, the brainstem and cerebellum are normal. There are mild chronic small-vessel ischemic changes of the cerebral hemispheric white matter. No mass lesion, hydrocephalus or extra-axial collection. Vascular: Major vessels at the base of the brain show flow. Skull and upper cervical spine: Negative Sinuses/Orbits: Sinuses are clear. Small mastoid effusions. Orbits negative. Other: None IMPRESSION: Numerous small infarctions scattered within the right cerebral hemisphere, probably watershed distribution. No large or confluent infarction. The differential diagnosis does include multiple micro embolic infarctions, but the pattern is more consistent with watershed insult. Electronically Signed   By: Nelson Chimes M.D.   On: 06/21/2018 17:51   Mr Cervical Spine Wo Contrast  Result Date: 06/21/2018 CLINICAL DATA:  Generalized weakness and shoulder pain. Recent fall. EXAM: MRI CERVICAL SPINE WITHOUT CONTRAST TECHNIQUE: Multiplanar, multisequence MR imaging of the cervical spine was performed. No intravenous contrast was administered.  COMPARISON:  None. FINDINGS: Alignment: Kyphotic curvature in the midcervical region. Vertebrae: No visible acute fracture by MRI. Cord: No cord compression, primary cord lesion or evidence of cord injury. Posterior Fossa, vertebral arteries, paraspinal tissues: See results of brain MRI. Disc levels: Foramen magnum is widely patent. Ordinary mild osteoarthritis at the C1-2 articulation without encroachment upon the neural structures. C2-3: Chronic fusion.  Wide patency of the canal and foramina. C3-4: Facet arthropathy worse on the right than the left with right facet edema. No disc herniation. No central canal stenosis. Foraminal narrowing on the right that could affect the C4 nerve. C4-5: Mild bulging of the disc. Facet degeneration worse on the left. No central canal stenosis. Mild bilateral foraminal stenosis. C5-6: Spondylosis with endplate osteophytes and bulging of the disc. Narrowing of the ventral subarachnoid space but no compression of the cord. Foraminal stenosis right worse than left. Right C6 nerve could be affected. C6-7: Endplate osteophytes and bulging of the disc. No compressive canal stenosis. Foraminal stenosis left worse than right. Either C7 nerve could be affected, particularly the left. C7-T1: No disc abnormality. No stenosis. Mild facet osteoarthritis. Upper thoracic region appears unremarkable. IMPRESSION: No detectable acute or traumatic finding. Likely chronic kyphotic curvature in the cervical region. Chronic fusion at C2-3. Facet arthropathy at C3-4 worse on the right with edema that could be symptomatic. Right foraminal stenosis could affect the right C4 nerve. C4-5 spondylosis and facet degeneration. Mild bilateral foraminal narrowing only. C5-6 spondylosis with foraminal narrowing right worse than left that could in particular affect the right C6 nerve. C6-7 spondylosis with foraminal narrowing left worse than right that could in particular affect the left C7 nerve. Electronically  Signed   By: Nelson Chimes M.D.   On: 06/21/2018 17:49   Dg Shoulder Left  Result Date: 06/21/2018 CLINICAL DATA:  Shoulder pain from fall 1 week ago. EXAM: LEFT SHOULDER - 2+ VIEW COMPARISON:  None. FINDINGS: Osseous alignment is normal. No fracture line or displaced fracture fragment seen. Mild degenerative spurring at the acromioclavicular joint space. Soft tissues about the LEFT shoulder are unremarkable. IMPRESSION: No acute findings. Mild degenerative change at the acromioclavicular joint space. Electronically Signed   By: Cherlynn Kaiser  Enriqueta Shutter M.D.   On: 06/21/2018 10:10    Pending Labs Unresulted Labs (From admission, onward)   None      Vitals/Pain Today's Vitals   06/21/18 1815 06/21/18 1830 06/21/18 1945 06/21/18 2007  BP:   (!) 161/84   Pulse: 85 86 91   Resp:   20   Temp:      TempSrc:      SpO2: 98% 97% 97%   PainSc:    10-Worst pain ever    Isolation Precautions No active isolations  Medications Medications  morphine 4 MG/ML injection 4 mg (4 mg Intravenous Given 06/21/18 1522)    Mobility walks with person assist

## 2018-06-21 NOTE — Progress Notes (Signed)
Received pt via [facility], alert and oriented X4, placed on Tele and verified, call light in place, monitoring continues

## 2018-06-21 NOTE — ED Notes (Signed)
[facility] at bedside 

## 2018-06-21 NOTE — ED Notes (Signed)
Report given to [facility]. 

## 2018-06-21 NOTE — ED Notes (Signed)
Family at bedside. 

## 2018-06-21 NOTE — ED Triage Notes (Signed)
Pt BIB EMS from MD office with c/o generalized weakness and left shoulder pain from previous fall.  Was admitted to [facility] 12/4, d/c 12/5.

## 2018-06-21 NOTE — H&P (Signed)
History and Physical    [name] UQJ:[id number] DOB: 1950-10-01 DOA: 06/21/2018  PCP: [name], [name], MD  Patient coming from: Home.  Chief Complaint: Left hand weakness.  HPI: [name] is a 67 y.o. female with previous history of hypertension off antihypertensives for many years now, tobacco abuse who was just discharged yesterday after being admitted for acute GI bleed EGD showed multiple ulcers in the duodenum and also ulcers in the gastric mucosa and esophagus presents to the ER because of persistent weakness and unable to dorsiflex left hand.  Patient states her symptoms started yesterday after discharge around 3 PM which improved briefly but started again worsening this morning.  Denies any weakness of the other extremities or difficulty speaking swallowing or any visual symptoms.  ED Course: In the ER patient had CT head which was not showing anything acute.  MRI of the brain and C-spine was done which shows multiple infarcts in the watershed area of the right cerebrum and admitted for further work-up for acute stroke.  Review of Systems: As per HPI, rest all negative.   Past Medical History:  Diagnosis Date  . Chronic pain   . Hyperlipidemia   . Hypertension     Past Surgical History:  Procedure Laterality Date  . BIOPSY  06/19/2018   Procedure: BIOPSY;  Surgeon: Laurence Spates, MD;  Location: [facility]OSCOPY;  Service: Endoscopy;;  . ESOPHAGOGASTRODUODENOSCOPY (EGD) WITH PROPOFOL N/A 06/19/2018   Procedure: ESOPHAGOGASTRODUODENOSCOPY (EGD) WITH PROPOFOL;  Surgeon: Laurence Spates, MD;  Location: [facility] ENDOSCOPY;  Service: Endoscopy;  Laterality: N/A;     reports that she has been smoking cigarettes. She has been smoking about 0.25 packs per day. She has never used smokeless tobacco. She reports that she drinks alcohol. She reports that she does not use drugs.  No Known Allergies  Family History  Problem Relation Age of Onset  . Cancer Mother        lung  cancer  . Diabetes Other   . Hyperlipidemia Other   . Hypertension Other   . Cancer Other     Prior to Admission medications   Medication Sig Start Date End Date Taking? Authorizing Provider  acetaminophen (TYLENOL) 650 MG CR tablet Take 1,300 mg by mouth daily as needed for pain.   Yes [provider]  pantoprazole (PROTONIX) 40 MG tablet Take 1 tablet (40 mg total) by mouth 2 (two) times daily before a meal. 06/20/18  Yes Danford, Suann Larry, MD  clobetasol cream (TEMOVATE) 3.89 % Apply 1 application topically 2 (two) times daily. 14 days Patient not taking: Reported on 06/19/2018 12/14/17   [name], [name], MD  lisinopril-hydrochlorothiazide (PRINZIDE,ZESTORETIC) 20-25 MG tablet TAKE 1 TABLET BY MOUTH EVERY DAY *FOLLOW UP APPOINTMENT NEEDED* Patient not taking: Reported on 06/19/2018 11/05/17   [name], [name], MD    Physical Exam: Vitals:   06/21/18 1945 06/21/18 2000 06/21/18 2130 06/21/18 2337  BP: (!) 161/84 (!) 143/85 137/67 (!) 155/89  Pulse: 91 89 85 84  Resp: 20 20 16 16   Temp:    98.4 F (36.9 C)  TempSrc:    Oral  SpO2: 97% 98% 96% 95%      Constitutional: Moderately built and nourished. Vitals:   06/21/18 1945 06/21/18 2000 06/21/18 2130 06/21/18 2337  BP: (!) 161/84 (!) 143/85 137/67 (!) 155/89  Pulse: 91 89 85 84  Resp: 20 20 16 16   Temp:    98.4 F (36.9 C)  TempSrc:  Oral  SpO2: 97% 98% 96% 95%   Eyes: Anicteric no pallor. ENMT: No discharge from the ears eyes nose or mouth. Neck: No mass felt.  No neck rigidity. Respiratory: No rhonchi or crepitations. Cardiovascular: S1-S2 heard. Abdomen: Soft nontender bowel sounds present. Musculoskeletal: No edema.  No joint effusion. Skin: No rash. Neurologic: Alert awake oriented to time place and person.  Unable to dorsiflex left hand at the wrist and the left upper extremity strength is around 2 x 5.  Rest of the extremities are 5 x 5.  No facial asymmetry tongue is midline pupils are equal and  reacting to light. Psychiatric: Appears normal.   Labs on Admission: I have personally reviewed following labs and imaging studies  CBC: Recent Labs  Lab 06/19/18 0836 06/19/18 1652 06/19/18 2333 06/20/18 0628 06/21/18 1024 06/21/18 1213  WBC 9.9  --   --  7.9 9.3 9.3  NEUTROABS  --   --   --   --   --  7.1  HGB 10.1* 9.3* 8.1* 8.1* 8.5* 8.2*  HCT 33.6* 30.3* 26.7* 26.6* 25.5* 26.3*  MCV 107.3*  --   --  108.6* 98.9 105.6*  PLT 179  --   --  179  --  034   Basic Metabolic Panel: Recent Labs  Lab 06/19/18 0836 06/20/18 0628 06/21/18 1213  NA 139 140 141  K 4.1 3.8 3.6  CL 112* 112* 106  CO2 20* 24 26  GLUCOSE 149* 125* 106*  BUN 28* 16 11  CREATININE 0.74 0.58 0.76  CALCIUM 7.8* 8.0* 8.8*   GFR: Estimated Creatinine Clearance: 76.1 mL/min (by C-G formula based on SCr of 0.76 mg/dL). Liver Function Tests: Recent Labs  Lab 06/19/18 0836 06/21/18 1213  AST 15 18  ALT 14 15  ALKPHOS 37* 45  BILITOT 0.4 0.6  PROT 4.9* 5.9*  ALBUMIN 2.6* 3.4*   No results for input(s): LIPASE, AMYLASE in the last 168 hours. Recent Labs  Lab 06/19/18 0849  AMMONIA 16   Coagulation Profile: Recent Labs  Lab 06/19/18 0836 06/21/18 1213  INR 1.01 0.90   Cardiac Enzymes: Recent Labs  Lab 06/21/18 1213  TROPONINI <0.03   BNP (last 3 results) No results for input(s): PROBNP in the last 8760 hours. HbA1C: No results for input(s): HGBA1C in the last 72 hours. CBG: Recent Labs  Lab 06/21/18 1328  GLUCAP 92   Lipid Profile: No results for input(s): CHOL, HDL, LDLCALC, TRIG, CHOLHDL, LDLDIRECT in the last 72 hours. Thyroid Function Tests: No results for input(s): TSH, T4TOTAL, FREET4, T3FREE, THYROIDAB in the last 72 hours. Anemia Panel: No results for input(s): VITAMINB12, FOLATE, FERRITIN, TIBC, IRON, RETICCTPCT in the last 72 hours. Urine analysis:    Component Value Date/Time   COLORURINE YELLOW 06/21/2018 [place] 06/21/2018 1214   LABSPEC  1.010 06/21/2018 1214   PHURINE 5.5 06/21/2018 1214   GLUCOSEU NEGATIVE 06/21/2018 1214   HGBUR NEGATIVE 06/21/2018 1214   [place] 06/21/2018 1214   BILIRUBINUR negative 06/21/2018 1057   KETONESUR NEGATIVE 06/21/2018 1214   PROTEINUR NEGATIVE 06/21/2018 1214   UROBILINOGEN 0.2 06/21/2018 1057   NITRITE NEGATIVE 06/21/2018 1214   LEUKOCYTESUR NEGATIVE 06/21/2018 1214   Sepsis Labs: @LABRCNTIP (procalcitonin:4,lacticidven:4) )No results found for this or any previous visit (from the past 240 hour(s)).   Radiological Exams on Admission: Ct Head Wo Contrast  Result Date: 06/21/2018 CLINICAL DATA:  Generalized weakness and LEFT shoulder pain from previous fall. Focal neuro deficit, greater than  6 hours. EXAM: CT HEAD WITHOUT CONTRAST TECHNIQUE: Contiguous axial images were obtained from the base of the skull through the vertex without intravenous contrast. COMPARISON:  None. FINDINGS: Brain: Mild generalized parenchymal volume loss with commensurate dilatation of the ventricles and sulci. Patchy small low-density areas within the bilateral periventricular and subcortical white matter regions, compatible with chronic small vessel ischemia. No mass, hemorrhage, edema or other evidence of acute parenchymal abnormality. No extra-axial hemorrhage. Vascular: Chronic calcified atherosclerotic changes of the large vessels at the skull base. No unexpected hyperdense vessel. Skull: Normal. Negative for fracture or focal lesion. Sinuses/Orbits: No acute finding. Other: None. IMPRESSION: 1. No acute findings. No intracranial mass, hemorrhage or edema. 2. Mild chronic small vessel ischemic changes in the white matter. Electronically Signed   By: Franki Cabot M.D.   On: 06/21/2018 12:52   Mr Brain Wo Contrast (neuro Protocol)  Result Date: 06/21/2018 CLINICAL DATA:  Generalized weakness. Left shoulder pain. Previous fall. EXAM: MRI HEAD WITHOUT CONTRAST TECHNIQUE: Multiplanar, multiecho pulse  sequences of the brain and surrounding structures were obtained without intravenous contrast. COMPARISON:  Head CT same day FINDINGS: Brain: Numerous small acute infarctions scattered throughout the right cerebral hemisphere in a pattern most consistent with watershed infarction. The differential diagnosis includes micro embolic infarctions. No large or confluent infarction. No swelling or hemorrhage. Elsewhere, the brainstem and cerebellum are normal. There are mild chronic small-vessel ischemic changes of the cerebral hemispheric white matter. No mass lesion, hydrocephalus or extra-axial collection. Vascular: Major vessels at the base of the brain show flow. Skull and upper cervical spine: Negative Sinuses/Orbits: Sinuses are clear. Small mastoid effusions. Orbits negative. Other: None IMPRESSION: Numerous small infarctions scattered within the right cerebral hemisphere, probably watershed distribution. No large or confluent infarction. The differential diagnosis does include multiple micro embolic infarctions, but the pattern is more consistent with watershed insult. Electronically Signed   By: Nelson Chimes M.D.   On: 06/21/2018 17:51   Mr Cervical Spine Wo Contrast  Result Date: 06/21/2018 CLINICAL DATA:  Generalized weakness and shoulder pain. Recent fall. EXAM: MRI CERVICAL SPINE WITHOUT CONTRAST TECHNIQUE: Multiplanar, multisequence MR imaging of the cervical spine was performed. No intravenous contrast was administered. COMPARISON:  None. FINDINGS: Alignment: Kyphotic curvature in the midcervical region. Vertebrae: No visible acute fracture by MRI. Cord: No cord compression, primary cord lesion or evidence of cord injury. Posterior Fossa, vertebral arteries, paraspinal tissues: See results of brain MRI. Disc levels: Foramen magnum is widely patent. Ordinary mild osteoarthritis at the C1-2 articulation without encroachment upon the neural structures. C2-3: Chronic fusion.  Wide patency of the canal and  foramina. C3-4: Facet arthropathy worse on the right than the left with right facet edema. No disc herniation. No central canal stenosis. Foraminal narrowing on the right that could affect the C4 nerve. C4-5: Mild bulging of the disc. Facet degeneration worse on the left. No central canal stenosis. Mild bilateral foraminal stenosis. C5-6: Spondylosis with endplate osteophytes and bulging of the disc. Narrowing of the ventral subarachnoid space but no compression of the cord. Foraminal stenosis right worse than left. Right C6 nerve could be affected. C6-7: Endplate osteophytes and bulging of the disc. No compressive canal stenosis. Foraminal stenosis left worse than right. Either C7 nerve could be affected, particularly the left. C7-T1: No disc abnormality. No stenosis. Mild facet osteoarthritis. Upper thoracic region appears unremarkable. IMPRESSION: No detectable acute or traumatic finding. Likely chronic kyphotic curvature in the cervical region. Chronic fusion at C2-3. Facet arthropathy at C3-4  worse on the right with edema that could be symptomatic. Right foraminal stenosis could affect the right C4 nerve. C4-5 spondylosis and facet degeneration. Mild bilateral foraminal narrowing only. C5-6 spondylosis with foraminal narrowing right worse than left that could in particular affect the right C6 nerve. C6-7 spondylosis with foraminal narrowing left worse than right that could in particular affect the left C7 nerve. Electronically Signed   By: Nelson Chimes M.D.   On: 06/21/2018 17:49   Dg Shoulder Left  Result Date: 06/21/2018 CLINICAL DATA:  Shoulder pain from fall 1 week ago. EXAM: LEFT SHOULDER - 2+ VIEW COMPARISON:  None. FINDINGS: Osseous alignment is normal. No fracture line or displaced fracture fragment seen. Mild degenerative spurring at the acromioclavicular joint space. Soft tissues about the LEFT shoulder are unremarkable. IMPRESSION: No acute findings. Mild degenerative change at the  acromioclavicular joint space. Electronically Signed   By: Franki Cabot M.D.   On: 06/21/2018 10:10    EKG: Independently reviewed.  Normal sinus rhythm.  Assessment/Plan Principal Problem:   Acute CVA (cerebrovascular accident) ([place]) Active Problems:   Upper GI bleed   Acute blood loss anemia    1. Acute CVA -patient is beyond the window period for intervention.  Patient has not been placed on aspirin due to recent GI bleed which I discussed with Dr. Lorraine Lax on-call neurologist.  Dr. Lorraine Lax requested to get CT angiogram of the head and neck.  Will get 2D echo keep patient on neurochecks and patient has passed swallow.  Check hemoglobin A1c lipid panel. 2. Recent GI bleed with acute blood loss anemia follow CBC.  Transfuse if less than 7.  On Protonix twice daily.  EGD showed multiple ulcers in the duodenum with gastric ulcers and esophageal ulcers. 3. Previous history of hypertension has not been taking the antihypertensives for many years.  Allow for permissive hypertension given that the patient has watershed infarcts. 4. Tobacco abuse -advised to quit smoking.   DVT prophylaxis: SCDs due to recent GI bleed avoiding Lovenox. Code Status: Full code. Family Communication: Discussed with patient. Disposition Plan: Home. Consults called: Neurology. Admission status: Observation.   Rise Patience MD [facility] Pager [phone].  If 7PM-7AM, please contact night-coverage [email or web address] Password [email or web address]  06/21/2018, 11:40 PM

## 2018-06-21 NOTE — ED Notes (Signed)
Attempted to give report. Will call back when available

## 2018-06-21 NOTE — ED Notes (Signed)
Pt able to eat and drink per Eulis Foster, MD orders

## 2018-06-21 NOTE — ED Provider Notes (Signed)
[facility] DEPT Provider Note   CSN: [id number] Arrival date & time: 06/21/18  1138     History   Chief Complaint Chief Complaint  Patient presents with  . Weakness    HPI [name] is a 67 y.o. female.  67 year old female with prior medical history as detailed below presents for evaluation of left hand weakness.  Patient was just discharged yesterday from [facility] after a work-up for GI bleed.  Patient was admitted on Wednesday secondary to a near syncopal episode (with minor head trauma sustained at home).  She was diagnosed with GI bleed in the ED.  Work-up included an EGD as inpatient.  Patient presented to her primary care provider's office this morning for follow-up.  She was noted to have significant weakness of the left hand.  The patient reports that her left hand has been weak for at least the last 24 hours.  The patient is also complaining of diffuse left shoulder discomfort.  It is unclear whether the shoulder was injured during the fall on Wednesday.  The history is provided by the patient, medical records and a relative.  Weakness  Primary symptoms include focal weakness. This is a new problem. The current episode started yesterday. The problem has not changed since onset.There has been no fever. Pertinent negatives include no shortness of breath, no chest pain, no vomiting, no altered mental status, no confusion and no headaches.    Past Medical History:  Diagnosis Date  . Chronic pain   . Hyperlipidemia   . Hypertension     Patient Active Problem List   Diagnosis Date Noted  . Upper GI bleed 06/19/2018  . Psoriasis 12/14/2017  . Knee pain, right 12/14/2017  . BMI 32.0-32.9,adult 12/15/2011  . Underinsured 12/15/2011  . HYPERLIPIDEMIA 03/26/2007  . Essential hypertension 03/26/2007    Past Surgical History:  Procedure Laterality Date  . BIOPSY  06/19/2018   Procedure: BIOPSY;  Surgeon: Laurence Spates, MD;  Location: [facility]  ENDOSCOPY;  Service: Endoscopy;;  . ESOPHAGOGASTRODUODENOSCOPY (EGD) WITH PROPOFOL N/A 06/19/2018   Procedure: ESOPHAGOGASTRODUODENOSCOPY (EGD) WITH PROPOFOL;  Surgeon: Laurence Spates, MD;  Location: [facility];  Service: Endoscopy;  Laterality: N/A;     OB History   None      Home Medications    Prior to Admission medications   Medication Sig Start Date End Date Taking? Authorizing Provider  acetaminophen (TYLENOL) 650 MG CR tablet Take 1,300 mg by mouth daily as needed for pain.   Yes [provider]  pantoprazole (PROTONIX) 40 MG tablet Take 1 tablet (40 mg total) by mouth 2 (two) times daily before a meal. 06/20/18  Yes Danford, Suann Larry, MD  clobetasol cream (TEMOVATE) 0.53 % Apply 1 application topically 2 (two) times daily. 14 days Patient not taking: Reported on 06/19/2018 12/14/17   [name], [name], MD  lisinopril-hydrochlorothiazide ([place]) 20-25 MG tablet TAKE 1 TABLET BY MOUTH EVERY DAY *FOLLOW UP APPOINTMENT NEEDED* Patient not taking: Reported on 06/19/2018 11/05/17   [name], [name], MD    Family History Family History  Problem Relation Age of Onset  . Cancer Mother        lung cancer  . Diabetes Other   . Hyperlipidemia Other   . Hypertension Other   . Cancer Other     Social History Social History   Tobacco Use  . Smoking status: Current Some Day Smoker    Packs/day: 0.25    Types: Cigarettes  . Smokeless tobacco: Never  Used  Substance Use Topics  . Alcohol use: Yes    Comment: wine 2 glasses a day   . Drug use: Never    Comment: Hemp Oil     Allergies   Patient has no known allergies.   Review of Systems Review of Systems  Respiratory: Negative for shortness of breath.   Cardiovascular: Negative for chest pain.  Gastrointestinal: Negative for vomiting.  Neurological: Positive for focal weakness and weakness. Negative for headaches.  Psychiatric/Behavioral: Negative for confusion.  All other systems reviewed and are  negative.    Physical Exam Updated Vital Signs BP 128/80   Pulse 86   Temp 98.5 F (36.9 C) (Oral)   SpO2 95%   Physical Exam  Constitutional: She is oriented to person, place, and time. She appears well-developed and well-nourished. No distress.  HENT:  Head: Normocephalic and atraumatic.  Mouth/Throat: Oropharynx is clear and moist.  Eyes: Pupils are equal, round, and reactive to light. Conjunctivae and EOM are normal.  Neck: Normal range of motion. Neck supple.  Cardiovascular: Normal rate, regular rhythm and normal heart sounds.  Pulmonary/Chest: Effort normal and breath sounds normal. No respiratory distress.  Abdominal: Soft. She exhibits no distension. There is no tenderness.  Genitourinary: Rectal exam shows guaiac positive stool.  Musculoskeletal: Normal range of motion. She exhibits no edema or deformity.  Neurological: She is alert and oriented to person, place, and time.  Significant weakness (2/5) out of the distal left upper extremity  No appreciable weakness noted of the left shoulder or left elbow.  Skin: Skin is warm and dry.  Psychiatric: She has a normal mood and affect.  Nursing note and vitals reviewed.    ED Treatments / Results  Labs (all labs ordered are listed, but only abnormal results are displayed) Labs Reviewed  CBC WITH DIFFERENTIAL/PLATELET - Abnormal; Notable for the following components:      Result Value   RBC 2.49 (*)    Hemoglobin 8.2 (*)    HCT 26.3 (*)    MCV 105.6 (*)    All other components within normal limits  COMPREHENSIVE METABOLIC PANEL - Abnormal; Notable for the following components:   Glucose, Bld 106 (*)    Calcium 8.8 (*)    Total Protein 5.9 (*)    Albumin 3.4 (*)    All other components within normal limits  POC OCCULT BLOOD, ED - Abnormal; Notable for the following components:   Fecal Occult Bld POSITIVE (*)    All other components within normal limits  PROTIME-INR  TROPONIN I  URINALYSIS, ROUTINE W REFLEX  MICROSCOPIC  CBG MONITORING, ED  TYPE AND SCREEN    EKG EKG Interpretation  Date/Time:  Friday June 21 2018 12:10:31 EST Ventricular Rate:  88 PR Interval:    QRS Duration: 94 QT Interval:  383 QTC Calculation: 464 R Axis:   43 Text Interpretation:  Sinus rhythm Confirmed by Dene Gentry [phone]) on 06/21/2018 12:16:53 PM   Radiology Ct Head Wo Contrast  Result Date: 06/21/2018 CLINICAL DATA:  Generalized weakness and LEFT shoulder pain from previous fall. Focal neuro deficit, greater than 6 hours. EXAM: CT HEAD WITHOUT CONTRAST TECHNIQUE: Contiguous axial images were obtained from the base of the skull through the vertex without intravenous contrast. COMPARISON:  None. FINDINGS: Brain: Mild generalized parenchymal volume loss with commensurate dilatation of the ventricles and sulci. Patchy small low-density areas within the bilateral periventricular and subcortical white matter regions, compatible with chronic small vessel ischemia. No mass, hemorrhage, edema  or other evidence of acute parenchymal abnormality. No extra-axial hemorrhage. Vascular: Chronic calcified atherosclerotic changes of the large vessels at the skull base. No unexpected hyperdense vessel. Skull: Normal. Negative for fracture or focal lesion. Sinuses/Orbits: No acute finding. Other: None. IMPRESSION: 1. No acute findings. No intracranial mass, hemorrhage or edema. 2. Mild chronic small vessel ischemic changes in the white matter. Electronically Signed   By: Franki Cabot M.D.   On: 06/21/2018 12:52   Dg Shoulder Left  Result Date: 06/21/2018 CLINICAL DATA:  Shoulder pain from fall 1 week ago. EXAM: LEFT SHOULDER - 2+ VIEW COMPARISON:  None. FINDINGS: Osseous alignment is normal. No fracture line or displaced fracture fragment seen. Mild degenerative spurring at the acromioclavicular joint space. Soft tissues about the LEFT shoulder are unremarkable. IMPRESSION: No acute findings. Mild degenerative change at the  acromioclavicular joint space. Electronically Signed   By: Franki Cabot M.D.   On: 06/21/2018 10:10    Procedures Procedures (including critical care time)  Medications Ordered in ED Medications - No data to display   Initial Impression / Assessment and Plan / ED Course  I have reviewed the triage vital signs and the nursing notes.  Pertinent labs & imaging results that were available during my care of the patient were reviewed by me and considered in my medical decision making (see chart for details).     MDM  Screen complete  Patient is presenting for evaluation of left hand weakness.  This is a subacute presentation.  Symptoms have been ongoing for at least the last 24 hours.  Her weakness may be related to both near syncopal episode/minor head injury on Wednesday when she was diagnosed from an acute GI bleed. She was discharged yesterday.  Patient with stable H and H. Ongoing GI bleeding is unlikely.  She is not a candidate for TPA.   CT head is negative.  Case discussed with neurology on-call - Dr. Rory Percy - who recommends MRI of the brain and C-spine.  If MRI shows acute infarct or other significant pathology, patient will need to be transferred to [facility] for further evaluation. If MRI is negative, patient is okay for discharge with outpatient follow up with neurology.   Pending studies and disposition discussed with Dr. Eulis Foster.   Final Clinical Impressions(s) / ED Diagnoses   Final diagnoses:  Left hand weakness    ED Discharge Orders    None       Valarie Merino, MD 06/21/18 1512

## 2018-06-21 NOTE — ED Provider Notes (Addendum)
7:45 PM-checkout from prior provider to evaluate MRI imaging, for disposition.  MRI shows brain watershed infarct likely cause of left arm weakness.  Abnormal cervical spine MRI also indicates potential source for left arm weakness with foraminal narrowing.  The patient will require admission.  She will be transferred to [facility] for management.   Patient Vitals for the past 24 hrs:  BP Temp Temp src Pulse Resp SpO2  06/21/18 2000 (!) 143/85 - - 89 20 98 %  06/21/18 1945 (!) 161/84 - - 91 20 97 %  06/21/18 1830 - - - 86 - 97 %  06/21/18 1815 - - - 85 - 98 %  06/21/18 1800 120/90 - - - - -  06/21/18 1748 139/83 - - 83 15 100 %  06/21/18 1630 (!) 135/97 - - 82 - 92 %  06/21/18 1530 129/81 - - 82 18 95 %  06/21/18 1500 123/72 - - 87 19 -  06/21/18 1408 128/80 - - 86 - -  06/21/18 1212 - 98.5 F (36.9 C) Oral - - -  06/21/18 1203 - - - 88 - 95 %  06/21/18 1154 - - - - - 98 %    Ct Head Wo Contrast  Result Date: 06/21/2018 CLINICAL DATA:  Generalized weakness and LEFT shoulder pain from previous fall. Focal neuro deficit, greater than 6 hours. EXAM: CT HEAD WITHOUT CONTRAST TECHNIQUE: Contiguous axial images were obtained from the base of the skull through the vertex without intravenous contrast. COMPARISON:  None. FINDINGS: Brain: Mild generalized parenchymal volume loss with commensurate dilatation of the ventricles and sulci. Patchy small low-density areas within the bilateral periventricular and subcortical white matter regions, compatible with chronic small vessel ischemia. No mass, hemorrhage, edema or other evidence of acute parenchymal abnormality. No extra-axial hemorrhage. Vascular: Chronic calcified atherosclerotic changes of the large vessels at the skull base. No unexpected hyperdense vessel. Skull: Normal. Negative for fracture or focal lesion. Sinuses/Orbits: No acute finding. Other: None. IMPRESSION: 1. No acute findings. No intracranial mass, hemorrhage or edema. 2. Mild  chronic small vessel ischemic changes in the white matter. Electronically Signed   By: Franki Cabot M.D.   On: 06/21/2018 12:52   Mr Brain Wo Contrast (neuro Protocol)  Result Date: 06/21/2018 CLINICAL DATA:  Generalized weakness. Left shoulder pain. Previous fall. EXAM: MRI HEAD WITHOUT CONTRAST TECHNIQUE: Multiplanar, multiecho pulse sequences of the brain and surrounding structures were obtained without intravenous contrast. COMPARISON:  Head CT same day FINDINGS: Brain: Numerous small acute infarctions scattered throughout the right cerebral hemisphere in a pattern most consistent with watershed infarction. The differential diagnosis includes micro embolic infarctions. No large or confluent infarction. No swelling or hemorrhage. Elsewhere, the brainstem and cerebellum are normal. There are mild chronic small-vessel ischemic changes of the cerebral hemispheric white matter. No mass lesion, hydrocephalus or extra-axial collection. Vascular: Major vessels at the base of the brain show flow. Skull and upper cervical spine: Negative Sinuses/Orbits: Sinuses are clear. Small mastoid effusions. Orbits negative. Other: None IMPRESSION: Numerous small infarctions scattered within the right cerebral hemisphere, probably watershed distribution. No large or confluent infarction. The differential diagnosis does include multiple micro embolic infarctions, but the pattern is more consistent with watershed insult. Electronically Signed   By: Nelson Chimes M.D.   On: 06/21/2018 17:51   Mr Cervical Spine Wo Contrast  Result Date: 06/21/2018 CLINICAL DATA:  Generalized weakness and shoulder pain. Recent fall. EXAM: MRI CERVICAL SPINE WITHOUT CONTRAST TECHNIQUE: Multiplanar, multisequence MR imaging of the  cervical spine was performed. No intravenous contrast was administered. COMPARISON:  None. FINDINGS: Alignment: Kyphotic curvature in the midcervical region. Vertebrae: No visible acute fracture by MRI. Cord: No cord  compression, primary cord lesion or evidence of cord injury. Posterior Fossa, vertebral arteries, paraspinal tissues: See results of brain MRI. Disc levels: Foramen magnum is widely patent. Ordinary mild osteoarthritis at the C1-2 articulation without encroachment upon the neural structures. C2-3: Chronic fusion.  Wide patency of the canal and foramina. C3-4: Facet arthropathy worse on the right than the left with right facet edema. No disc herniation. No central canal stenosis. Foraminal narrowing on the right that could affect the C4 nerve. C4-5: Mild bulging of the disc. Facet degeneration worse on the left. No central canal stenosis. Mild bilateral foraminal stenosis. C5-6: Spondylosis with endplate osteophytes and bulging of the disc. Narrowing of the ventral subarachnoid space but no compression of the cord. Foraminal stenosis right worse than left. Right C6 nerve could be affected. C6-7: Endplate osteophytes and bulging of the disc. No compressive canal stenosis. Foraminal stenosis left worse than right. Either C7 nerve could be affected, particularly the left. C7-T1: No disc abnormality. No stenosis. Mild facet osteoarthritis. Upper thoracic region appears unremarkable. IMPRESSION: No detectable acute or traumatic finding. Likely chronic kyphotic curvature in the cervical region. Chronic fusion at C2-3. Facet arthropathy at C3-4 worse on the right with edema that could be symptomatic. Right foraminal stenosis could affect the right C4 nerve. C4-5 spondylosis and facet degeneration. Mild bilateral foraminal narrowing only. C5-6 spondylosis with foraminal narrowing right worse than left that could in particular affect the right C6 nerve. C6-7 spondylosis with foraminal narrowing left worse than right that could in particular affect the left C7 nerve. Electronically Signed   By: Nelson Chimes M.D.   On: 06/21/2018 17:49   Dg Shoulder Left  Result Date: 06/21/2018 CLINICAL DATA:  Shoulder pain from fall 1  week ago. EXAM: LEFT SHOULDER - 2+ VIEW COMPARISON:  None. FINDINGS: Osseous alignment is normal. No fracture line or displaced fracture fragment seen. Mild degenerative spurring at the acromioclavicular joint space. Soft tissues about the LEFT shoulder are unremarkable. IMPRESSION: No acute findings. Mild degenerative change at the acromioclavicular joint space. Electronically Signed   By: Franki Cabot M.D.   On: 06/21/2018 10:10    7:48 PM Reevaluation with update and discussion. After initial assessment and treatment, an updated evaluation reveals she is alert, comfortable.  She is sitting up.  There is no ataxia.  There is no dysarthria.  Left hand flexed, unable to extend, she states this started about 24 hours ago, possibly longer. [name]     CRITICAL CARE-no Performed by: [name]  Nursing Notes Reviewed/ Care Coordinated Applicable Imaging Reviewed Interpretation of Laboratory Data incorporated into ED treatment  8:03 PM-Consult complete with hospitalist. Patient case explained and discussed.  He agrees to admit patient for further evaluation and treatment. Call ended at 8:25 PM   Plan: Admit   Heather Bo, MD 06/21/18 2124    Heather Bo, MD 06/21/18 2125

## 2018-06-21 NOTE — ED Notes (Signed)
Bed: IP18 Expected date:  Expected time:  Means of arrival:  Comments: EMS-low hGb

## 2018-06-21 NOTE — ED Notes (Signed)
Patient transported to CT 

## 2018-06-21 NOTE — ED Notes (Signed)
[facility] contacted to take pt to [id number]. Paperwork printed and ready

## 2018-06-21 NOTE — Progress Notes (Signed)
Subjective:    Patient: [name]  DOB: 1950-08-02; 67 y.o.   MRN: [id number]  Chief Complaint  Patient presents with  . Shoulder Pain    X 1 week- left shoulder    HPI She fell 2d ago. She just went down.  Hit her head after a GI bleed and so had a presyncopal episode.  She was brought by EMS to [facility] on 12/4 where she was hospitalized overnight to 12/5.  They did an EGD.  She states she had this initial episode of melena from [employer] powder for her pain - an excessive amount - but never had any gastritis.  She was kept NPO in the hospital - finally ate last night. Has not had a BM since her initial episode of melena.  She was told f/u her her PCP in 1-2 wks and GI in 2 weeks but none of this has been scheduled yet.  She does have iron supp [address] [address] ferrous sulfate at home but has not started it yet. But does not have any h/o problems with constipation.  Has started on protonix - got first pill in yesterday.  Has had a lot of gas but not BM. Lots of gas.  Not taking BP pill - has some at home. Does not check BP outside office.   Has a lot of pain going on and her shoulder has been bothering her and whenever she twisted to the left to get off of the toilet for the past month and then putting her left hand down onto her sink to push herself up off the toilet so has become more tender.  She has developed weakness in her left grip just this morning.   Medical History Past Medical History:  Diagnosis Date  . Chronic pain   . Hyperlipidemia   . Hypertension    Past Surgical History:  Procedure Laterality Date  . BIOPSY  06/19/2018   Procedure: BIOPSY;  Surgeon: Laurence Spates, MD;  Location: [facility];  Service: Endoscopy;;  . ESOPHAGOGASTRODUODENOSCOPY (EGD) WITH PROPOFOL N/A 06/19/2018   Procedure: ESOPHAGOGASTRODUODENOSCOPY (EGD) WITH PROPOFOL;  Surgeon: Laurence Spates, MD;  Location: [facility];  Service: Endoscopy;  Laterality: N/A;   Current Outpatient Medications on  File Prior to Visit  Medication Sig Dispense Refill  . Cyanocobalamin (VITAMIN B-12) 1000 MCG/15ML LIQD Take 15 mLs by mouth daily.    . pantoprazole (PROTONIX) 40 MG tablet Take 1 tablet (40 mg total) by mouth 2 (two) times daily before a meal. 60 tablet 3  . clobetasol cream (TEMOVATE) 5.42 % Apply 1 application topically 2 (two) times daily. 14 days (Patient not taking: Reported on 06/19/2018) 45 g 0  . lisinopril-hydrochlorothiazide (PRINZIDE,ZESTORETIC) 20-25 MG tablet TAKE 1 TABLET BY MOUTH EVERY DAY *FOLLOW UP APPOINTMENT NEEDED* (Patient not taking: Reported on 06/19/2018) 90 tablet 0   No current facility-administered medications on file prior to visit.    No Known Allergies Family History  Problem Relation Age of Onset  . Cancer Mother        lung cancer  . Diabetes Other   . Hyperlipidemia Other   . Hypertension Other   . Cancer Other    Social History   Socioeconomic History  . Marital status: Single    Spouse name: Not on file  . Number of children: 0  . Years of education: Not on file  . Highest education level: Not on file  Occupational History  . Not on file  Social Needs  .  Financial resource strain: Not on file  . Food insecurity:    Worry: Not on file    Inability: Not on file  . Transportation needs:    Medical: Not on file    Non-medical: Not on file  Tobacco Use  . Smoking status: Current Some Day Smoker    Packs/day: 0.25    Types: Cigarettes  . Smokeless tobacco: Never Used  Substance and Sexual Activity  . Alcohol use: Yes    Comment: wine 2 glasses a day   . Drug use: Never    Comment: Hemp Oil  . Sexual activity: Not Currently  Lifestyle  . Physical activity:    Days per week: Not on file    Minutes per session: Not on file  . Stress: Not on file  Relationships  . Social connections:    Talks on phone: Not on file    Gets together: Not on file    Attends religious service: Not on file    Active member of club or organization: Not on  file    Attends meetings of clubs or organizations: Not on file    Relationship status: Not on file  Other Topics Concern  . 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 She appears well-developed and well-nourished. No distress.  HENT:  Head: Normocephalic and atraumatic.  Right Ear: External ear normal.  Left Ear: External ear normal.  Eyes: Conjunctivae are normal. No scleral icterus.  Neck: Normal range of motion. Neck supple. No thyromegaly present.  Cardiovascular: Normal rate, regular rhythm, normal heart sounds and intact distal pulses.  Pulmonary/Chest: Effort normal and breath sounds normal. No respiratory distress.  Musculoskeletal: She exhibits no edema.  Lymphadenopathy:    She has no cervical adenopathy.  Neurological: She is alert and oriented to person, place, and time. No cranial nerve deficit or sensory deficit. She exhibits abnormal muscle tone. She displays no seizure activity. Coordination and gait abnormal.  Reflex Scores:      Tricep reflexes are 2+ on the right side and 3+ on the left side.      Bicep reflexes are 2+ on the right side and 3+ on the left side.      Brachioradialis reflexes are 2+ on the right side and 3+ on the left side. + Rhomberg, unable to assess pronator drift or asterisks  Skin: Skin is warm and dry. She is not diaphoretic. No erythema.  Psychiatric: She has a normal mood and affect. Her behavior is normal.    POC TESTING Lab Results  Component Value Date   WBC 9.3  06/21/2018   HGB 8.5 (A) 06/21/2018   HCT 25.5 (A) 06/21/2018   MCV 98.9 06/21/2018   PLT 179 06/20/2018   Orthostatic VS for the past 24 hrs:  BP- Lying Pulse- Lying BP-  Standing at 0 minutes Pulse- Standing at 0 minutes  06/21/18 1032 126/83 89 130/80 99    Dg Shoulder Left  Result Date: 06/21/2018 CLINICAL DATA:  Shoulder pain from fall 1 week ago. EXAM: LEFT SHOULDER - 2+ VIEW COMPARISON:  None. FINDINGS: Osseous alignment is normal. No fracture line or displaced fracture fragment seen. Mild degenerative spurring at the acromioclavicular joint space. Soft tissues about the LEFT shoulder are unremarkable. IMPRESSION: No acute findings. Mild degenerative change at the acromioclavicular joint space. Electronically Signed   By: Franki Cabot M.D.   On: 06/21/2018 10:10    Assessment & Plan:   1. Pain in joint of left shoulder   2. Upper GI bleed   3. Acute post-traumatic headache, not intractable   4. Dizziness and giddiness   5. Weakness of left upper extremity   6. Injury of head, sequela     Last known normal, last night.  This morning her husband noticed decreased grip  Patient will continue on current chronic medications other than changes noted above, so ok to refill when needed.   See after visit summary for patient specific instructions.  No orders of the defined types were placed in this encounter.   No orders of the defined types were placed in this encounter.   Patient verbalized to me that they understand the following: diagnosis, what is being done for them, what to expect and what should be done at home.  Their questions have been answered. They understand that I am unable to predict every possible medication interaction or adverse outcome and that if any unexpected symptoms arise, they should contact us and their pharmacist, as well as never hesitate to seek urgent/emergent care at [facility] or ER if they think it might be warranted.    Delman Cheadle, MD,  [facility] [address] [place], [place]  [address] [phone] Office phone  [phone] Office fax  06/21/18 9:42 AM

## 2018-06-21 NOTE — Patient Instructions (Signed)
° ° ° °  If you have lab work done today you will be contacted with your lab results within the next 2 weeks.  If you have not heard from us then please contact us. The fastest way to get your results is to register for My Chart. ° ° °IF you received an x-ray today, you will receive an invoice from [facility]. Please contact [facility] at [phone] with questions or concerns regarding your invoice.  ° °IF you received labwork today, you will receive an invoice from [facility]. Please contact [facility] at [phone] with questions or concerns regarding your invoice.  ° °Our billing staff will not be able to assist you with questions regarding bills from these companies. ° °You will be contacted with the lab results as soon as they are available. The fastest way to get your results is to activate your My Chart account. Instructions are located on the last page of this paperwork. If you have not heard from us regarding the results in 2 weeks, please contact this office. °  ° ° ° °

## 2018-06-21 NOTE — Telephone Encounter (Signed)
Called pt and left a message stating that Dr. Brigitte Pulse placed a stat CT for her to have done today and to give me a call back so we can get that scheduled today.

## 2018-06-22 ENCOUNTER — Telehealth: Payer: Self-pay | Admitting: Family Medicine

## 2018-06-22 ENCOUNTER — Inpatient Hospital Stay (HOSPITAL_COMMUNITY): Payer: Medicare Other

## 2018-06-22 ENCOUNTER — Observation Stay (HOSPITAL_COMMUNITY): Payer: Medicare Other

## 2018-06-22 ENCOUNTER — Other Ambulatory Visit: Payer: Self-pay

## 2018-06-22 DIAGNOSIS — K922 Gastrointestinal hemorrhage, unspecified: Secondary | ICD-10-CM | POA: Diagnosis not present

## 2018-06-22 DIAGNOSIS — E785 Hyperlipidemia, unspecified: Secondary | ICD-10-CM | POA: Diagnosis present

## 2018-06-22 DIAGNOSIS — I6789 Other cerebrovascular disease: Secondary | ICD-10-CM

## 2018-06-22 DIAGNOSIS — E669 Obesity, unspecified: Secondary | ICD-10-CM | POA: Diagnosis present

## 2018-06-22 DIAGNOSIS — M549 Dorsalgia, unspecified: Secondary | ICD-10-CM | POA: Diagnosis present

## 2018-06-22 DIAGNOSIS — I739 Peripheral vascular disease, unspecified: Secondary | ICD-10-CM | POA: Diagnosis present

## 2018-06-22 DIAGNOSIS — F17210 Nicotine dependence, cigarettes, uncomplicated: Secondary | ICD-10-CM | POA: Diagnosis present

## 2018-06-22 DIAGNOSIS — R590 Localized enlarged lymph nodes: Secondary | ICD-10-CM | POA: Diagnosis not present

## 2018-06-22 DIAGNOSIS — R918 Other nonspecific abnormal finding of lung field: Secondary | ICD-10-CM | POA: Diagnosis not present

## 2018-06-22 DIAGNOSIS — I252 Old myocardial infarction: Secondary | ICD-10-CM | POA: Diagnosis not present

## 2018-06-22 DIAGNOSIS — I6521 Occlusion and stenosis of right carotid artery: Secondary | ICD-10-CM | POA: Diagnosis present

## 2018-06-22 DIAGNOSIS — I639 Cerebral infarction, unspecified: Secondary | ICD-10-CM | POA: Diagnosis not present

## 2018-06-22 DIAGNOSIS — D62 Acute posthemorrhagic anemia: Secondary | ICD-10-CM | POA: Diagnosis present

## 2018-06-22 DIAGNOSIS — K219 Gastro-esophageal reflux disease without esophagitis: Secondary | ICD-10-CM | POA: Diagnosis present

## 2018-06-22 DIAGNOSIS — G8929 Other chronic pain: Secondary | ICD-10-CM | POA: Diagnosis present

## 2018-06-22 DIAGNOSIS — Z6837 Body mass index (BMI) 37.0-37.9, adult: Secondary | ICD-10-CM | POA: Diagnosis not present

## 2018-06-22 DIAGNOSIS — K5900 Constipation, unspecified: Secondary | ICD-10-CM | POA: Diagnosis present

## 2018-06-22 DIAGNOSIS — Z79899 Other long term (current) drug therapy: Secondary | ICD-10-CM | POA: Diagnosis not present

## 2018-06-22 DIAGNOSIS — I6340 Cerebral infarction due to embolism of unspecified cerebral artery: Secondary | ICD-10-CM | POA: Diagnosis present

## 2018-06-22 DIAGNOSIS — I10 Essential (primary) hypertension: Secondary | ICD-10-CM | POA: Diagnosis present

## 2018-06-22 DIAGNOSIS — E876 Hypokalemia: Secondary | ICD-10-CM | POA: Diagnosis not present

## 2018-06-22 LAB — LIPID PANEL
Cholesterol: 163 mg/dL (ref 0–200)
HDL: 34 mg/dL — ABNORMAL LOW (ref >40)
LDL CALC: 108 mg/dL — AB (ref 0–99)
TRIGLYCERIDES: 104 mg/dL (ref <150)
Total CHOL/HDL Ratio: 4.8 RATIO
VLDL: 21 mg/dL (ref 0–40)

## 2018-06-22 LAB — COMPREHENSIVE METABOLIC PANEL
ALT: 13 U/L (ref 0–44)
AST: 15 U/L (ref 15–41)
Albumin: 2.8 g/dL — ABNORMAL LOW (ref 3.5–5.0)
Alkaline Phosphatase: 46 U/L (ref 38–126)
Anion gap: 8 (ref 5–15)
BUN: 9 mg/dL (ref 8–23)
CO2: 26 mmol/L (ref 22–32)
Calcium: 8.4 mg/dL — ABNORMAL LOW (ref 8.9–10.3)
Chloride: 105 mmol/L (ref 98–111)
Creatinine, Ser: 0.88 mg/dL (ref 0.44–1.00)
GFR calc Af Amer: >60 mL/min (ref >60)
GFR calc non Af Amer: >60 mL/min (ref >60)
Glucose, Bld: 102 mg/dL — ABNORMAL HIGH (ref 70–99)
Potassium: 3.2 mmol/L — ABNORMAL LOW (ref 3.5–5.1)
Sodium: 139 mmol/L (ref 135–145)
Total Bilirubin: 0.5 mg/dL (ref 0.3–1.2)
Total Protein: 5.1 g/dL — ABNORMAL LOW (ref 6.5–8.1)

## 2018-06-22 LAB — CBC
HCT: 24.7 % — ABNORMAL LOW (ref 36.0–46.0)
Hemoglobin: 7.5 g/dL — ABNORMAL LOW (ref 12.0–15.0)
MCH: 31.5 pg (ref 26.0–34.0)
MCHC: 30.4 g/dL (ref 30.0–36.0)
MCV: 103.8 fL — ABNORMAL HIGH (ref 80.0–100.0)
NRBC: 0 % (ref 0.0–0.2)
Platelets: 226 10*3/uL (ref 150–400)
RBC: 2.38 MIL/uL — ABNORMAL LOW (ref 3.87–5.11)
RDW: 14.7 % (ref 11.5–15.5)
WBC: 8.4 10*3/uL (ref 4.0–10.5)

## 2018-06-22 LAB — TYPE AND SCREEN
ABO/RH(D): A POS
Antibody Screen: NEGATIVE

## 2018-06-22 LAB — HEMOGLOBIN A1C
Hgb A1c MFr Bld: 5.1 % (ref 4.8–5.6)
Mean Plasma Glucose: 99.67 mg/dL

## 2018-06-22 LAB — ABO/RH: ABO/RH(D): A POS

## 2018-06-22 MED ORDER — IOPAMIDOL (ISOVUE-370) INJECTION 76%
100.0000 mL | Freq: Once | INTRAVENOUS | Status: AC | PRN
  Administered 2018-06-22: 100 mL via INTRAVENOUS

## 2018-06-22 MED ORDER — IOPAMIDOL (ISOVUE-370) INJECTION 76%
INTRAVENOUS | Status: AC
  Filled 2018-06-22: qty 100

## 2018-06-22 MED ORDER — ATORVASTATIN CALCIUM 40 MG PO TABS
40.0000 mg | ORAL_TABLET | Freq: Every day | ORAL | Status: DC
  Administered 2018-06-22 – 2018-06-26 (×5): 40 mg via ORAL
  Filled 2018-06-22 (×5): qty 1

## 2018-06-22 NOTE — Telephone Encounter (Signed)
Pt would like to talk to Dr. Brigitte Pulse and thank her personally.  She is now in recovery for a stroke and she wants to say thank you and that she probably wouldn't be alive today if it wasn't for her.  Please advise at [phone].  Thank you Dr. Brigitte Pulse!

## 2018-06-22 NOTE — Evaluation (Signed)
Occupational Therapy Evaluation Patient Details Name: [name] MRN: [id number] DOB: [birth year] Today's Date: 06/22/2018    History of Present Illness [name] is an 67 y.o. female with PMH: hypertension, hyperlipidemia, obesity, psoriasis, recently discharged from [facility] for GI bleed and presented to PCP same day with L sided weakness that was apparently present on discharge. MRI showed multiple infarcts R cerebral hemisphere.   Clinical Impression   PTA, pt was living with her significant other, Dominica Severin, and was independent. Pt currently requiring Min A for ADLs and functional mobility. Pt presenting with decreased functional use of LUE, strength, and balance. Pt highly motivated to participate in therapy and return to PLOF stating "I can't live like this." Pt participating in multiple exercises (simulating functional tasks) for grasp and release, ROM, and coordination of LUE. Pt would benefit from further acute OT to facilitate safe dc. Recommend dc to home with follow up at OP OT to optimize safety, independence with ADLs, and return to PLOF.      Follow Up Recommendations  Outpatient OT;Supervision/Assistance - 24 hour    Equipment Recommendations  None recommended by OT    Recommendations for Other Services PT consult     Precautions / Restrictions Precautions Precautions: Fall Precaution Comments: has back pain and R knee pain that affect her balance and function Restrictions Weight Bearing Restrictions: No      Mobility Bed Mobility               General bed mobility comments: Sitting in recliner upon arrival  Transfers Overall transfer level: Needs assistance Equipment used: Rolling walker (2 wheeled) Transfers: Sit to/from Stand Sit to Stand: Min assist         General transfer comment: Min A for balance and safety    Balance Overall balance assessment: Needs assistance Sitting-balance support: No upper extremity supported Sitting  balance-Leahy Scale: Fair     Standing balance support: No upper extremity supported Standing balance-Leahy Scale: Fair Standing balance comment: able to maintain static stance as well as wash hands at sink without support or LOB                           ADL either performed or assessed with clinical judgement   ADL Overall ADL's : Needs assistance/impaired Eating/Feeding: Minimal assistance;Sitting Eating/Feeding Details (indicate cue type and reason): opening containers Grooming: Minimal assistance;Standing;Oral care Grooming Details (indicate cue type and reason): Min A for incorporating left hand into oral care for holding tooth paste or managing tooth brush (in packet). Educating pt on weight bearing throughout left UE during grooming Upper Body Bathing: Minimal assistance;Sitting   Lower Body Bathing: Minimal assistance;Sit to/from stand   Upper Body Dressing : Minimal assistance;Sitting   Lower Body Dressing: Minimal assistance;Sit to/from stand   Toilet Transfer: Minimal assistance;Ambulation;RW(with and without RW)           Functional mobility during ADLs: Minimal assistance General ADL Comments: Pt requiring Min A for ADLs and functional mobility due to decreased functional use of LUE, strength, and balance.      Vision   Additional Comments: Denies any visual deficits or changes     Perception     Praxis      Pertinent Vitals/Pain Pain Assessment: Faces Faces Pain Scale: Hurts even more Pain Location: back and R knee and L shoulder Pain Descriptors / Indicators: Aching Pain Intervention(s): Limited activity within patient's tolerance;Monitored during session;Repositioned     Hand  Dominance Right   Extremity/Trunk Assessment Upper Extremity Assessment Upper Extremity Assessment: LUE deficits/detail LUE Deficits / Details: Able to perform shoulder and elbow AROM; noting slight compensatory hiking for shoulder ROM. Decreased AROM for wrist  and hand. Poor opposition of thumb to index finger; unable to complete further finger opposition. Decreased grasp strength.  LUE Coordination: decreased fine motor;decreased gross motor   Lower Extremity Assessment Lower Extremity Assessment: Defer to PT evaluation RLE Deficits / Details: painful knee with mobility, ROM WFL, strength >3/5 RLE Sensation: WNL RLE Coordination: WNL   Cervical / Trunk Assessment Cervical / Trunk Assessment: Kyphotic   Communication Communication Communication: No difficulties   Cognition Arousal/Alertness: Awake/alert Behavior During Therapy: WFL for tasks assessed/performed Overall Cognitive Status: Within Functional Limits for tasks assessed                                 General Comments: Presenting with decreased processing speed and requiring increased time during conversation. Very motivated to participate and return to PLOF   General Comments       Exercises Exercises: General Upper Extremity;Other exercises General Exercises - Upper Extremity Shoulder Flexion: AROM;Left;10 reps;Seated Shoulder Extension: AROM;Left;10 reps;Seated Elbow Flexion: AROM;Left;10 reps;Seated Elbow Extension: AROM;Left;10 reps;Seated Wrist Flexion: AAROM;Left;10 reps;Seated Wrist Extension: AAROM;Left;10 reps;Seated Digit Composite Flexion: AROM;Left;10 reps;Seated Composite Extension: AROM;Left;10 reps;Seated Other Exercises Other Exercises: Weight bearing through LUE during ADLs and while seated.  Other Exercises: Grasp and release tasks Other Exercises: cleaning table with wash cloth for increasing ROM   Shoulder Instructions      Home Living Family/patient expects to be discharged to:: Private residence Living Arrangements: Spouse/significant other Available Help at Discharge: Family;Available 24 hours/day Type of Home: House Home Access: Stairs to enter     Home Layout: Multi-level Alternate Level Stairs-Number of Steps: 6 Alternate  Level Stairs-Rails: Right     Bathroom Toilet: Standard     Home Equipment: Shower seat;Walker - 2 wheels;Grab bars - tub/shower;Grab bars - toilet   Additional Comments: pt's spouse works from home and can be with her. House is a split level       Prior Functioning/Environment Level of Independence: Needs assistance  Gait / Transfers Assistance Needed: has difficulty with ambulation due to back and knee pain so limited distance and does not leave house much ADL's / Homemaking Assistance Needed: ADLs and IADLs   Comments: Enjoys cooking        OT Problem List: Decreased strength;Decreased range of motion;Decreased activity tolerance;Impaired balance (sitting and/or standing);Decreased coordination;Decreased knowledge of use of DME or AE;Decreased knowledge of precautions;Impaired UE functional use;Pain      OT Treatment/Interventions: Self-care/ADL training;Therapeutic exercise;Energy conservation;DME and/or AE instruction;Neuromuscular education;Therapeutic activities;Patient/family education;Balance training    OT Goals(Current goals can be found in the care plan section) Acute Rehab OT Goals Patient Stated Goal: Cook again OT Goal Formulation: With patient Time For Goal Achievement: 07/06/18 Potential to Achieve Goals: Good  OT Frequency: Min 3X/week   Barriers to D/C:            Co-evaluation              AM-PAC OT "6 Clicks" Daily Activity     Outcome Measure Help from another person eating meals?: A Little Help from another person taking care of personal grooming?: A Little Help from another person toileting, which includes using toliet, bedpan, or urinal?: A Little Help from another person bathing (including washing, rinsing, drying)?:  A Little Help from another person to put on and taking off regular upper body clothing?: A Little Help from another person to put on and taking off regular lower body clothing?: A Little 6 Click Score: 18   End of Session  Equipment Utilized During Treatment: Gait belt;Rolling walker Nurse Communication: Mobility status;Precautions  Activity Tolerance: Patient tolerated treatment well Patient left: in chair;with call bell/phone within reach;with family/visitor present;with chair alarm set  OT Visit Diagnosis: Unsteadiness on feet (R26.81);Other abnormalities of gait and mobility (R26.89);Muscle weakness (generalized) (M62.81);Hemiplegia and hemiparesis;Pain Hemiplegia - Right/Left: Left Hemiplegia - dominant/non-dominant: Non-Dominant Hemiplegia - caused by: Cerebral infarction Pain - part of body: (Back)                Time: 9357-0177 OT Time Calculation (min): 37 min Charges:  OT General Charges $OT Visit: 1 Visit OT Evaluation $OT Eval Moderate Complexity: 1 Mod OT Treatments $Self Care/Home Management : 8-22 mins  Nishtha Raider MSOT, OTR/L Acute Rehab Pager: [phone] Office: [place] 06/22/2018, 3:16 PM

## 2018-06-22 NOTE — Evaluation (Signed)
Physical Therapy Evaluation Patient Details Name: SABREA SANKEY MRN: [id number] DOB: [birth year] Today's Date: 06/22/2018   History of Present Illness  LADIAMOND GALLINA is an 67 y.o. female with PMH: hypertension, hyperlipidemia, obesity, psoriasis, recently discharged from [facility] for GI bleed and presented to PCP same day with L sided weakness that was apparently present on discharge. MRI showed multiple infarcts R cerebral hemisphere.  Clinical Impression  Pt admitted with above diagnosis. Pt currently with functional limitations due to the deficits listed below (see PT Problem List). Pt having trouble getting up from seated position with use of RUE only due to h/o R knee arthritis and back pain. Ambulated 150' with and without AD, unsteady with no device but did not do well holding RW, cane will be likely choice for her. Recommend outpatient therapy to address LUE, encouraged her to use it as much as possible.  Pt will benefit from skilled PT to increase their independence and safety with mobility to allow discharge to the venue listed below.       Follow Up Recommendations Outpatient PT    Equipment Recommendations  Cane    Recommendations for Other Services       Precautions / Restrictions Precautions Precautions: Fall Precaution Comments: has back pain and R knee pain that affect her balance and function Restrictions Weight Bearing Restrictions: No      Mobility  Bed Mobility               General bed mobility comments: pt received sitting EOB  Transfers Overall transfer level: Needs assistance Equipment used: Rolling walker (2 wheeled) Transfers: Sit to/from Stand Sit to Stand: Mod assist         General transfer comment: mod A for power up  Ambulation/Gait Ambulation/Gait assistance: Min assist Gait Distance (Feet): 150 Feet Assistive device: IV Pole;Rolling walker (2 wheeled) Gait Pattern/deviations: Step-through pattern;Wide base of  support;Decreased stride length Gait velocity: decreased Gait velocity interpretation: 1.31 - 2.62 ft/sec, indicative of limited community ambulator General Gait Details: began with RW but pt had difficulty holding with L hand and felt that it was more support than she needed. Pushed pole after that and then used nothing last 25'. Pt unsteady without AD, was able to be supervision level with pushing pole, cane may be good option for her.   Stairs            Wheelchair Mobility    Modified Rankin (Stroke Patients Only) Modified Rankin (Stroke Patients Only) Pre-Morbid Rankin Score: No symptoms Modified Rankin: Moderately severe disability     Balance Overall balance assessment: Needs assistance Sitting-balance support: No upper extremity supported Sitting balance-Leahy Scale: Fair     Standing balance support: No upper extremity supported Standing balance-Leahy Scale: Fair Standing balance comment: able to maintain static stance as well as wash hands at sink without support or LOB                             Pertinent Vitals/Pain Pain Assessment: Faces Faces Pain Scale: Hurts even more Pain Location: back and R knee and L shoulder Pain Descriptors / Indicators: Aching Pain Intervention(s): Limited activity within patient's tolerance;Monitored during session    Home Living Family/patient expects to be discharged to:: Private residence Living Arrangements: Spouse/significant other Available Help at Discharge: Family;Available 24 hours/day Type of Home: House Home Access: Stairs to enter     Home Layout: Multi-level Home Equipment: Clinical cytogeneticist - 2 wheels;Grab  bars - tub/shower;Grab bars - toilet Additional Comments: pt's spouse works from home and can be with her. House is a split level     Prior Function Level of Independence: Needs assistance   Gait / Transfers Assistance Needed: has difficulty with ambulation due to back and knee pain so limited  distance and does not leave house much           Hand Dominance   Dominant Hand: Right    Extremity/Trunk Assessment   Upper Extremity Assessment Upper Extremity Assessment: Defer to OT evaluation    Lower Extremity Assessment Lower Extremity Assessment: RLE deficits/detail RLE Deficits / Details: painful knee with mobility, ROM WFL, strength >3/5 RLE Sensation: WNL RLE Coordination: WNL    Cervical / Trunk Assessment Cervical / Trunk Assessment: Kyphotic  Communication   Communication: No difficulties  Cognition Arousal/Alertness: Awake/alert Behavior During Therapy: WFL for tasks assessed/performed Overall Cognitive Status: Within Functional Limits for tasks assessed                                 General Comments: a little difficult to follow her history but grossly intact      General Comments General comments (skin integrity, edema, etc.): worked on [employer] L hand as well as encouraging use of L hand with funtional activities.     Exercises     Assessment/Plan    PT Assessment Patient needs continued PT services  PT Problem List Decreased activity tolerance;Decreased balance;Decreased mobility;Decreased knowledge of use of DME;Decreased knowledge of precautions;Obesity;Pain       PT Treatment Interventions DME instruction;Gait training;Stair training;Functional mobility training;Therapeutic activities;Therapeutic exercise;Balance training;Neuromuscular re-education;Patient/family education    PT Goals (Current goals can be found in the Care Plan section)  Acute Rehab PT Goals Patient Stated Goal: return home PT Goal Formulation: With patient Time For Goal Achievement: 07/06/18 Potential to Achieve Goals: Good    Frequency Min 4X/week   Barriers to discharge Inaccessible home environment stairs    Co-evaluation               AM-PAC PT "6 Clicks" Mobility  Outcome Measure Help needed turning from your back to your side while  in a flat bed without using bedrails?: A Little Help needed moving from lying on your back to sitting on the side of a flat bed without using bedrails?: A Little Help needed moving to and from a bed to a chair (including a wheelchair)?: A Little Help needed standing up from a chair using your arms (e.g., wheelchair or bedside chair)?: A Little Help needed to walk in hospital room?: A Little Help needed climbing 3-5 steps with a railing? : A Little 6 Click Score: 18    End of Session Equipment Utilized During Treatment: Gait belt Activity Tolerance: Patient tolerated treatment well Patient left: in chair;with call bell/phone within reach;with family/visitor present Nurse Communication: Mobility status PT Visit Diagnosis: Unsteadiness on feet (R26.81);Pain Pain - Right/Left: (both) Pain - part of body: Knee;Shoulder    Time: 0925-0952 PT Time Calculation (min) (ACUTE ONLY): 27 min   Charges:   PT Evaluation $PT Eval Moderate Complexity: 1 Mod PT Treatments $Gait Training: 8-22 mins        Leighton Roach, [place]  Pager [phone] Office [place] 06/22/2018, 10:56 AM

## 2018-06-22 NOTE — Progress Notes (Addendum)
PROGRESS NOTE    Patient: [name]                            PCP: [name], [name], MD                    DOB: 1951/01/09            DOA: 06/21/2018 TIR:[id number]             DOS: 06/22/2018, 1:19 PM   LOS: 1 day   Date of Service: The patient was seen and examined on 06/22/2018  Subjective:   Patient was seen and examined this morning, still complaining of her left arm weakness, some improvement. Denies of having any left leg weakness.  Speech intact, stated that her thoughts are clear. Negative for any facial droopiness. No events overnight.  She was made aware of her Jennefer Bravo finding of a right cerebral infarct, or a finding of severe right ICA stenosis Currently patient cannot be on anticoagulation due to recent GI bleed.  Brief Narrative:   REMEE Hobbs is an 67 y.o. female with past medical history significant for hypertension, hyperlipidemia, obesity, psoriasis, recent admission for GI bleeding and hypotension on 12/4 presents to the emergency department at [facility] today with side weakness.  Patient scented to her PCP this morning following discharge yesterday.  She was noted to have significant weakness of her left hand which appeared to have been present on discharge.  She underwent an MRI brain which showed multiple infarctions in the right cerebral hemisphere.  Patient was admitted to the hospitalist team and transferred to [facility] for further evaluation. Her current blood pressure is in the 1 67-619 systolics.  He is not on any aspirin due to GI bleed.   Principal Problem:   Acute CVA (cerebrovascular accident) ([place]) Active Problems:   Upper GI bleed   Acute blood loss anemia    Assessment & Plan:     Acute CVA  -Patient was outside the window for TPA treatment, -Also recent history of GI bleed was not anticoagulated including aspirin Neurologist team, Dr. Lorraine Lax on-call following.  -MRI:  Numerous small infarctions scattered  within the right cerebral hemisphere, probably watershed distribution  CTA .>> Severe near occlusive stenosis at the origin of the right ICA, almost certainly the causative agent for the previously identified right cerebral watershed type infarcts. 2. Severe high-grade stenosis at the origin of the left vertebral artery, with additional multifocal moderate to severe stenoses just distal within the left V1 segment.  - Echo   >>pending   Allowing permissive hypertension, continue neurochecks, PT/OT Continue statins, avoiding antiplatelet therapy due to recent GI bleed eventually she needs to be on aspirin or Plavix.  Right ICA stenosis, severe -Surgery has been consulted, appreciate their input -Contributing to current CVA,   Recent GI bleed with acute blood loss anemia -Recent hospitalization, H&H stable, follow CBC.  Transfuse if less than 7.  On Protonix twice daily. -  EGD showed multiple ulcers in the duodenum with gastric ulcers and esophageal ulcers.   HTN -previous history of hypertension has not been taking the antihypertensives for many years.   Allow for permissive hypertension given that the patient has watershed infarcts.   Tobacco abuse  -advised to quit smoking.   Chronic back pain -PRN analgesics, try to avoid NSAIDs due to GI bleed  Approximate 5.5 cm right paratracheal mass, found  on CTA   possibly a primary mediastinal lesion. Further evaluation possibly biopsy and further imaging PCCM: Consulted pulmonary for further evaluation and documentations.   Other:   DVT prophylaxis: SCDs due to recent GI bleed avoiding Lovenox. Code Status: Full code. Family Communication: Discussed with patient. Disposition Plan: Home. Consults called: Neurology. Admission status: Observation.    Procedures:   No admission procedures for hospital encounter.    Antimicrobials:  Anti-infectives (From admission, onward)   None       Medication:  . pantoprazole   40 mg Oral BID AC    acetaminophen **OR** acetaminophen (TYLENOL) oral liquid 160 mg/5 mL **OR** acetaminophen     Objective:   Vitals:   06/22/18 0049 06/22/18 0354 06/22/18 0751 06/22/18 1218  BP:  137/79 (!) 147/83 135/70  Pulse:  83 76 95  Resp:  18 18 18   Temp:  98.4 F (36.9 C) 98.2 F (36.8 C) 98.9 F (37.2 C)  TempSrc:  Oral Oral Oral  SpO2:  96% 98% 98%  Weight: 99.2 kg     Height: 5\' 4"  (1.626 m)       Intake/Output Summary (Last 24 hours) at 06/22/2018 1319 Last data filed at 06/22/2018 1200 Gross per 24 hour  Intake 1453.53 ml  Output -  Net 1453.53 ml   Filed Weights   06/22/18 0049  Weight: 99.2 kg     Examination:    General exam: Appears calm and comfortable  BP 135/70 (BP Location: Left Arm)   Pulse 95   Temp 98.9 F (37.2 C) (Oral)   Resp 18   Ht 5\' 4"  (1.626 m)   Wt 99.2 kg   SpO2 98%   BMI 37.54 kg/m    Physical Exam  Constitution:  Alert, cooperative, no distress,  Psychiatric: Normal and stable mood and affect, cognition intact,   HEENT: Normocephalic, PERRL, otherwise with in Normal limits  Chest:Chest symmetric Cardio vascular:  S1/S2, RRR, No murmure, No Rubs or Gallops  pulmonary: Clear to auscultation bilaterally, respirations unlabored, negative wheezes / crackles Abdomen: Soft, non-tender, non-distended, bowel sounds,no masses, no organomegaly Muscular skeletal: Limited exam - in bed, able to move all 4 extremities, Normal strength,  Neuro: CNII-XII intact. ,  3/5 reduced left arm strength, intact sensation, reflexes intact  Extremities: No pitting edema lower extremities, +2 pulses  Skin: Dry, warm to touch, negative for any Rashes, No open wounds Wounds: per nursing documentation  LABs:  CBC Latest Ref Rng & Units 06/22/2018 06/21/2018 06/21/2018  WBC 4.0 - 10.5 K/uL 8.4 9.3 9.3  Hemoglobin 12.0 - 15.0 g/dL 7.5(L) 8.2(L) 8.5(A)  Hematocrit 36.0 - 46.0 % 24.7(L) 26.3(L) 25.5(A)  Platelets 150 - 400 K/uL 226 211 -   CMP  Latest Ref Rng & Units 06/22/2018 06/21/2018 06/20/2018  Glucose 70 - 99 mg/dL 102(H) 106(H) 125(H)  BUN 8 - 23 mg/dL 9 11 16   Creatinine 0.44 - 1.00 mg/dL 0.88 0.76 0.58  Sodium 135 - 145 mmol/L 139 141 140  Potassium 3.5 - 5.1 mmol/L 3.2(L) 3.6 3.8  Chloride 98 - 111 mmol/L 105 106 112(H)  CO2 22 - 32 mmol/L 26 26 24   Calcium 8.9 - 10.3 mg/dL 8.4(L) 8.8(L) 8.0(L)  Total Protein 6.5 - 8.1 g/dL 5.1(L) 5.9(L) -  Total Bilirubin 0.3 - 1.2 mg/dL 0.5 0.6 -  Alkaline Phos 38 - 126 U/L 46 45 -  AST 15 - 41 U/L 15 18 -  ALT 0 - 44 U/L 13 15 -  Ct Angio Head W Or Wo Contrast  Result Date: 06/22/2018 IMPRESSION: 1. Severe near occlusive stenosis at the origin of the right ICA, almost certainly the causative agent for the previously identified right cerebral watershed type infarcts. 2. Severe high-grade stenosis at the origin of the left vertebral artery, with additional multifocal moderate to severe stenoses just distal within the left V1 segment. Remainder of the vertebrobasilar system patent without additional high-grade stenosis. 3. Prominent atherosclerotic change about the aortic arch. Associated 50% stenosis at the origin of the right brachiocephalic artery. 40% stenosis involving the proximal left subclavian artery. 4. Approximate 5.5 cm right paratracheal mass, indeterminate, but could reflect adenopathy or possibly a primary mediastinal lesion. Further assessment with dedicated cross-sectional imaging of the chest recommended. Electronically Signed   By: Jeannine Boga M.D.   On: 06/22/2018 02:50   Ct Head Wo Contrast  Result Date: 06/21/2018  IMPRESSION: 1. No acute findings. No intracranial mass, hemorrhage or edema. 2. Mild chronic small vessel ischemic changes in the white matter. Electronically Signed   By: Franki Cabot M.D.   On: 06/21/2018 12:52   Ct Angio Neck W Or Wo Contrast  Result Date: 06/22/2018 IMPRESSION: 1. Severe near occlusive stenosis at the origin of the  right ICA, almost certainly the causative agent for the previously identified right cerebral watershed type infarcts. 2. Severe high-grade stenosis at the origin of the left vertebral artery, with additional multifocal moderate to severe stenoses just distal within the left V1 segment. Remainder of the vertebrobasilar system patent without additional high-grade stenosis. 3. Prominent atherosclerotic change about the aortic arch. Associated 50% stenosis at the origin of the right brachiocephalic artery. 40% stenosis involving the proximal left subclavian artery. 4. Approximate 5.5 cm right paratracheal mass, indeterminate, but could reflect adenopathy or possibly a primary mediastinal lesion. Further assessment with dedicated cross-sectional imaging of the chest recommended. Electronically Signed   By: Jeannine Boga M.D.   On: 06/22/2018 02:50   Mr Brain Wo Contrast (neuro Protocol) Result Date: 06/21/2018  IMPRESSION: Numerous small infarctions scattered within the right cerebral hemisphere, probably watershed distribution. No large or confluent infarction. The differential diagnosis does include multiple micro embolic infarctions, but the pattern is more consistent with watershed insult. Electronically Signed   By: Nelson Chimes M.D.   On: 06/21/2018 17:51   Mr Cervical Spine Wo Contrast Result Date: 06/21/2018  IMPRESSION: No detectable acute or traumatic finding. Likely chronic kyphotic curvature in the cervical region. Chronic fusion at C2-3. Facet arthropathy at C3-4 worse on the right with edema that could be symptomatic. Right foraminal stenosis could affect the right C4 nerve. C4-5 spondylosis and facet degeneration. Mild bilateral foraminal narrowing only. C5-6 spondylosis with foraminal narrowing right worse than left that could in particular affect the right C6 nerve. C6-7 spondylosis with foraminal narrowing left worse than right that could in particular affect the left C7 nerve.  Electronically Signed   By: Nelson Chimes M.D.   On: 06/21/2018 17:49   Dg Shoulder Left Result Date: 06/21/2018  IMPRESSION: No acute findings. Mild degenerative change at the acromioclavicular joint space. Electronically Signed   By: Franki Cabot M.D.   On: 06/21/2018 10:10

## 2018-06-22 NOTE — Progress Notes (Signed)
  Echocardiogram 2D Echocardiogram has been performed.  [name] 06/22/2018, 4:32 PM

## 2018-06-22 NOTE — Consult Note (Signed)
Requesting Physician: Dr. Hal Hope    Chief Complaint: Left arm weakness  History obtained from: Patient and Chart     HPI:                                                                                                                                       [name] is an 67 y.o. female with past medical history significant for hypertension, hyperlipidemia, obesity, psoriasis, recent admission for GI bleeding and hypotension on 12/4 presents to the emergency department at [facility] today with side weakness.  Patient scented to her PCP this morning following discharge yesterday.  She was noted to have significant weakness of her left hand which appeared to have been present on discharge.  She underwent an MRI brain which showed multiple infarctions in the right cerebral hemisphere.  Patient was admitted to the hospitalist team and transferred to [facility] for further evaluation. Her current blood pressure is in the 1 27-035 systolics.  He is not on any aspirin due to GI bleed.    Date last known well: 06/19/18 tPA Given: no, outside window  Baseline MRS 0     Past Medical History:  Diagnosis Date  . Chronic pain   . Hyperlipidemia   . Hypertension     Past Surgical History:  Procedure Laterality Date  . BIOPSY  06/19/2018   Procedure: BIOPSY;  Surgeon: Laurence Spates, MD;  Location: [facility] ENDOSCOPY;  Service: Endoscopy;;  . ESOPHAGOGASTRODUODENOSCOPY (EGD) WITH PROPOFOL N/A 06/19/2018   Procedure: ESOPHAGOGASTRODUODENOSCOPY (EGD) WITH PROPOFOL;  Surgeon: Laurence Spates, MD;  Location: [facility] ENDOSCOPY;  Service: Endoscopy;  Laterality: N/A;    Family History  Problem Relation Age of Onset  . Cancer Mother        lung cancer  . Diabetes Other   . Hyperlipidemia Other   . Hypertension Other   . Cancer Other    Social History:  reports that she has been smoking cigarettes. She has been smoking about 0.25 packs per day. She has never used smokeless  tobacco. She reports that she drinks alcohol. She reports that she does not use drugs.  Allergies: No Known Allergies  Medications:                                                                                                                        I reviewed home medications  ROS:                                                                                                                                     14 systems reviewed and negative except above    Examination:                                                                                                      General: Appears well-developed  Psych: Affect appropriate to situation Eyes: No scleral injection HENT: No OP obstrucion Head: Normocephalic.  Cardiovascular: Normal rate and regular rhythm.  Respiratory: Effort normal and breath sounds normal to anterior ascultation GI: Soft.  No distension. There is no tenderness.  Skin: WDI    Neurological Examination Mental Status: Alert, oriented, thought content appropriate.  Speech fluent without evidence of aphasia. Able to follow 3 step commands without difficulty. Cranial Nerves: II: Visual fields grossly normal,  III,IV, VI: ptosis not present, extra-ocular motions intact bilaterally, pupils equal, round, reactive to light and accommodation V,VII: smile symmetric, facial light touch sensation normal bilaterally VIII: hearing normal bilaterally IX,X: uvula rises symmetrically XI: bilateral shoulder shrug XII: midline tongue extension Motor: Right : Upper extremity   5/5    Left:     Upper extremity   4/5 ( 2/5 flexion and extension at the wrist with weak hand grip)  Lower extremity   5/5     Lower extremity   5/5 Tone and bulk:normal tone throughout; no atrophy noted Sensory: Pinprick and light touch intact throughout, bilaterally Deep Tendon Reflexes: 2+ and symmetric throughout Plantars: Right: downgoing   Left: downgoing Cerebellar: normal finger-to-nose,  normal rapid alternating movements and normal heel-to-shin test Gait: normal gait and station     Lab Results: Basic Metabolic Panel: Recent Labs  Lab 06/19/18 0836 06/20/18 0628 06/21/18 1213 06/22/18 0004  NA 139 140 141 139  K 4.1 3.8 3.6 3.2*  CL 112* 112* 106 105  CO2 20* 24 26 26   GLUCOSE 149* 125* 106* 102*  BUN 28* 16 11 9   CREATININE 0.74 0.58 0.76 0.88  CALCIUM 7.8* 8.0* 8.8* 8.4*    CBC: Recent Labs  Lab 06/19/18 0836  06/19/18 2333 06/20/18 0628 06/21/18 1024 06/21/18 1213 06/22/18 0004  WBC 9.9  --   --  7.9 9.3 9.3 8.4  NEUTROABS  --   --   --   --   --  7.1  --   HGB 10.1*   < > 8.1* 8.1* 8.5*  8.2* 7.5*  HCT 33.6*   < > 26.7* 26.6* 25.5* 26.3* 24.7*  MCV 107.3*  --   --  108.6* 98.9 105.6* 103.8*  PLT 179  --   --  179  --  211 226   < > = values in this interval not displayed.    Coagulation Studies: Recent Labs    06/19/18 0836 06/21/18 1213  LABPROT 13.2 12.1  INR 1.01 0.90    Imaging: Ct Angio Head W Or Wo Contrast  Result Date: 06/22/2018 CLINICAL DATA:  Initial evaluation for acute stroke. EXAM: CT ANGIOGRAPHY HEAD AND NECK TECHNIQUE: Multidetector CT imaging of the head and neck was performed using the standard protocol during bolus administration of intravenous contrast. Multiplanar CT image reconstructions and MIPs were obtained to evaluate the vascular anatomy. Carotid stenosis measurements (when applicable) are obtained utilizing NASCET criteria, using the distal internal carotid diameter as the denominator. CONTRAST:  14mL ISOVUE-370 IOPAMIDOL (ISOVUE-370) INJECTION 76% COMPARISON:  Previous MRI from 06/21/2018 FINDINGS: CTA NECK FINDINGS Aortic arch: Visualized arch of normal caliber with normal 3 vessel morphology. Penetrating atheromatous plaques noted at the lateral and inferior aspect of the arch itself (series 7, image 329). Mixed plaque about the origin of the great vessels with up to 50% stenosis at the proximal right  brachiocephalic artery. Diffuse narrowing of the proximal left subclavian artery of up to approximately 40%. Visualized subclavian arteries otherwise patent. Right carotid system: Right common carotid artery patent from its origin to the bifurcation. Mixed plaque with severe near occlusive stenosis at the proximal right ICA (series 7, image 203). A radiographic string sign is present. Stenosis begins at the bifurcation, and measures approximately 4-5 mm in length. Right ICA patent distally to the skull base without additional stenosis or other abnormality. Left carotid system: Mixed plaque at the origin of the left common carotid artery with mild 25% narrowing. Left CCA otherwise patent distally to the bifurcation. Predominantly soft plaque at the left bifurcation/proximal left ICA with relatively mild no more than 20% stenosis. Left ICA widely patent distally to the skull base without additional stenosis or other abnormality. Vertebral arteries: Both of the vertebral arteries arise from the subclavian arteries. Focal plaque at the origin of the left vertebral artery with severe high-grade stenosis (series 7, image 273). Focal tortuosity and plaque just distally within the pre foraminal left V1 segment with associated stenoses of up to 60-70%. Mild scattered plaque within the left V2 segment without significant stenosis. Dominant left vertebral artery otherwise patent to the skull base. Right vertebral artery patent from its origin to the skull base without stenosis or other abnormality. Skeleton: Kyphotic angulation of the cervical spine with spondylolysis at C5-6 and C6-7, better evaluated on recent MRI. No acute osseous abnormality. No discrete osseous lesions. Patient is edentulous. Other neck: No acute soft tissue abnormality within the neck. No adenopathy. Salivary glands normal. Thyroid within normal limits. Upper chest: Well-circumscribed right paratracheal mass measures approximately 4.4 x 5.5 cm (series 5,  image 164), indeterminate. Additional subcarinal adenopathy measures up to approximately 19 mm, partially visualized. Remainder of the visualized upper chest demonstrates no acute finding. 1 cm pleural base nodular density present at the left lung apex (series 5, image 139). Visualized lungs are otherwise grossly clear. Underlying emphysematous changes noted. Review of the MIP images confirms the above findings CTA HEAD FINDINGS Anterior circulation: Petrous segments widely patent bilaterally. Scattered atheromatous plaque within the cavernous/supraclinoid ICAs with mild multifocal narrowing. ICA termini patent. Dominant left A1  widely patent. Tiny hypoplastic right A1, also patent. Normal anterior communicating artery. Dominant right ACA with hypoplastic left ACA, both of which patent to their distal aspects without stenosis. M1 segments mildly irregular but patent to their distal aspects without stenosis. Distal MCA branches well perfused bilaterally. Distal small vessel atheromatous irregularity. Posterior circulation: Vertebral arteries patent to the vertebrobasilar junction without stenosis. Left vertebral artery dominant. Posterior inferior cerebral arteries patent bilaterally. Basilar widely patent to its distal aspect. Superior cerebral arteries patent bilaterally. Both of the posterior cerebral arteries primarily supplied via the basilar. PCAs patent to their distal aspects without high-grade stenosis. Distal small vessel atheromatous irregularity. Venous sinuses: Patent. Anatomic variants: Hypoplastic right A1 with dominant right A2. Delayed phase: No abnormal enhancement. Patchy right cerebral watershed type infarcts again noted. Review of the MIP images confirms the above findings IMPRESSION: 1. Severe near occlusive stenosis at the origin of the right ICA, almost certainly the causative agent for the previously identified right cerebral watershed type infarcts. 2. Severe high-grade stenosis at the  origin of the left vertebral artery, with additional multifocal moderate to severe stenoses just distal within the left V1 segment. Remainder of the vertebrobasilar system patent without additional high-grade stenosis. 3. Prominent atherosclerotic change about the aortic arch. Associated 50% stenosis at the origin of the right brachiocephalic artery. 40% stenosis involving the proximal left subclavian artery. 4. Approximate 5.5 cm right paratracheal mass, indeterminate, but could reflect adenopathy or possibly a primary mediastinal lesion. Further assessment with dedicated cross-sectional imaging of the chest recommended. Electronically Signed   By: Jeannine Boga M.D.   On: 06/22/2018 02:50   Ct Head Wo Contrast  Result Date: 06/21/2018 CLINICAL DATA:  Generalized weakness and LEFT shoulder pain from previous fall. Focal neuro deficit, greater than 6 hours. EXAM: CT HEAD WITHOUT CONTRAST TECHNIQUE: Contiguous axial images were obtained from the base of the skull through the vertex without intravenous contrast. COMPARISON:  None. FINDINGS: Brain: Mild generalized parenchymal volume loss with commensurate dilatation of the ventricles and sulci. Patchy small low-density areas within the bilateral periventricular and subcortical white matter regions, compatible with chronic small vessel ischemia. No mass, hemorrhage, edema or other evidence of acute parenchymal abnormality. No extra-axial hemorrhage. Vascular: Chronic calcified atherosclerotic changes of the large vessels at the skull base. No unexpected hyperdense vessel. Skull: Normal. Negative for fracture or focal lesion. Sinuses/Orbits: No acute finding. Other: None. IMPRESSION: 1. No acute findings. No intracranial mass, hemorrhage or edema. 2. Mild chronic small vessel ischemic changes in the white matter. Electronically Signed   By: Franki Cabot M.D.   On: 06/21/2018 12:52   Ct Angio Neck W Or Wo Contrast  Result Date: 06/22/2018 CLINICAL DATA:   Initial evaluation for acute stroke. EXAM: CT ANGIOGRAPHY HEAD AND NECK TECHNIQUE: Multidetector CT imaging of the head and neck was performed using the standard protocol during bolus administration of intravenous contrast. Multiplanar CT image reconstructions and MIPs were obtained to evaluate the vascular anatomy. Carotid stenosis measurements (when applicable) are obtained utilizing NASCET criteria, using the distal internal carotid diameter as the denominator. CONTRAST:  161mL ISOVUE-370 IOPAMIDOL (ISOVUE-370) INJECTION 76% COMPARISON:  Previous MRI from 06/21/2018 FINDINGS: CTA NECK FINDINGS Aortic arch: Visualized arch of normal caliber with normal 3 vessel morphology. Penetrating atheromatous plaques noted at the lateral and inferior aspect of the arch itself (series 7, image 329). Mixed plaque about the origin of the great vessels with up to 50% stenosis at the proximal right brachiocephalic artery. Diffuse narrowing of the  proximal left subclavian artery of up to approximately 40%. Visualized subclavian arteries otherwise patent. Right carotid system: Right common carotid artery patent from its origin to the bifurcation. Mixed plaque with severe near occlusive stenosis at the proximal right ICA (series 7, image 203). A radiographic string sign is present. Stenosis begins at the bifurcation, and measures approximately 4-5 mm in length. Right ICA patent distally to the skull base without additional stenosis or other abnormality. Left carotid system: Mixed plaque at the origin of the left common carotid artery with mild 25% narrowing. Left CCA otherwise patent distally to the bifurcation. Predominantly soft plaque at the left bifurcation/proximal left ICA with relatively mild no more than 20% stenosis. Left ICA widely patent distally to the skull base without additional stenosis or other abnormality. Vertebral arteries: Both of the vertebral arteries arise from the subclavian arteries. Focal plaque at the  origin of the left vertebral artery with severe high-grade stenosis (series 7, image 273). Focal tortuosity and plaque just distally within the pre foraminal left V1 segment with associated stenoses of up to 60-70%. Mild scattered plaque within the left V2 segment without significant stenosis. Dominant left vertebral artery otherwise patent to the skull base. Right vertebral artery patent from its origin to the skull base without stenosis or other abnormality. Skeleton: Kyphotic angulation of the cervical spine with spondylolysis at C5-6 and C6-7, better evaluated on recent MRI. No acute osseous abnormality. No discrete osseous lesions. Patient is edentulous. Other neck: No acute soft tissue abnormality within the neck. No adenopathy. Salivary glands normal. Thyroid within normal limits. Upper chest: Well-circumscribed right paratracheal mass measures approximately 4.4 x 5.5 cm (series 5, image 164), indeterminate. Additional subcarinal adenopathy measures up to approximately 19 mm, partially visualized. Remainder of the visualized upper chest demonstrates no acute finding. 1 cm pleural base nodular density present at the left lung apex (series 5, image 139). Visualized lungs are otherwise grossly clear. Underlying emphysematous changes noted. Review of the MIP images confirms the above findings CTA HEAD FINDINGS Anterior circulation: Petrous segments widely patent bilaterally. Scattered atheromatous plaque within the cavernous/supraclinoid ICAs with mild multifocal narrowing. ICA termini patent. Dominant left A1 widely patent. Tiny hypoplastic right A1, also patent. Normal anterior communicating artery. Dominant right ACA with hypoplastic left ACA, both of which patent to their distal aspects without stenosis. M1 segments mildly irregular but patent to their distal aspects without stenosis. Distal MCA branches well perfused bilaterally. Distal small vessel atheromatous irregularity. Posterior circulation: Vertebral  arteries patent to the vertebrobasilar junction without stenosis. Left vertebral artery dominant. Posterior inferior cerebral arteries patent bilaterally. Basilar widely patent to its distal aspect. Superior cerebral arteries patent bilaterally. Both of the posterior cerebral arteries primarily supplied via the basilar. PCAs patent to their distal aspects without high-grade stenosis. Distal small vessel atheromatous irregularity. Venous sinuses: Patent. Anatomic variants: Hypoplastic right A1 with dominant right A2. Delayed phase: No abnormal enhancement. Patchy right cerebral watershed type infarcts again noted. Review of the MIP images confirms the above findings IMPRESSION: 1. Severe near occlusive stenosis at the origin of the right ICA, almost certainly the causative agent for the previously identified right cerebral watershed type infarcts. 2. Severe high-grade stenosis at the origin of the left vertebral artery, with additional multifocal moderate to severe stenoses just distal within the left V1 segment. Remainder of the vertebrobasilar system patent without additional high-grade stenosis. 3. Prominent atherosclerotic change about the aortic arch. Associated 50% stenosis at the origin of the right brachiocephalic artery. 40% stenosis involving  the proximal left subclavian artery. 4. Approximate 5.5 cm right paratracheal mass, indeterminate, but could reflect adenopathy or possibly a primary mediastinal lesion. Further assessment with dedicated cross-sectional imaging of the chest recommended. Electronically Signed   By: Jeannine Boga M.D.   On: 06/22/2018 02:50   Mr Brain Wo Contrast (neuro Protocol)  Result Date: 06/21/2018 CLINICAL DATA:  Generalized weakness. Left shoulder pain. Previous fall. EXAM: MRI HEAD WITHOUT CONTRAST TECHNIQUE: Multiplanar, multiecho pulse sequences of the brain and surrounding structures were obtained without intravenous contrast. COMPARISON:  Head CT same day  FINDINGS: Brain: Numerous small acute infarctions scattered throughout the right cerebral hemisphere in a pattern most consistent with watershed infarction. The differential diagnosis includes micro embolic infarctions. No large or confluent infarction. No swelling or hemorrhage. Elsewhere, the brainstem and cerebellum are normal. There are mild chronic small-vessel ischemic changes of the cerebral hemispheric white matter. No mass lesion, hydrocephalus or extra-axial collection. Vascular: Major vessels at the base of the brain show flow. Skull and upper cervical spine: Negative Sinuses/Orbits: Sinuses are clear. Small mastoid effusions. Orbits negative. Other: None IMPRESSION: Numerous small infarctions scattered within the right cerebral hemisphere, probably watershed distribution. No large or confluent infarction. The differential diagnosis does include multiple micro embolic infarctions, but the pattern is more consistent with watershed insult. Electronically Signed   By: Nelson Chimes M.D.   On: 06/21/2018 17:51   Mr Cervical Spine Wo Contrast  Result Date: 06/21/2018 CLINICAL DATA:  Generalized weakness and shoulder pain. Recent fall. EXAM: MRI CERVICAL SPINE WITHOUT CONTRAST TECHNIQUE: Multiplanar, multisequence MR imaging of the cervical spine was performed. No intravenous contrast was administered. COMPARISON:  None. FINDINGS: Alignment: Kyphotic curvature in the midcervical region. Vertebrae: No visible acute fracture by MRI. Cord: No cord compression, primary cord lesion or evidence of cord injury. Posterior Fossa, vertebral arteries, paraspinal tissues: See results of brain MRI. Disc levels: Foramen magnum is widely patent. Ordinary mild osteoarthritis at the C1-2 articulation without encroachment upon the neural structures. C2-3: Chronic fusion.  Wide patency of the canal and foramina. C3-4: Facet arthropathy worse on the right than the left with right facet edema. No disc herniation. No central  canal stenosis. Foraminal narrowing on the right that could affect the C4 nerve. C4-5: Mild bulging of the disc. Facet degeneration worse on the left. No central canal stenosis. Mild bilateral foraminal stenosis. C5-6: Spondylosis with endplate osteophytes and bulging of the disc. Narrowing of the ventral subarachnoid space but no compression of the cord. Foraminal stenosis right worse than left. Right C6 nerve could be affected. C6-7: Endplate osteophytes and bulging of the disc. No compressive canal stenosis. Foraminal stenosis left worse than right. Either C7 nerve could be affected, particularly the left. C7-T1: No disc abnormality. No stenosis. Mild facet osteoarthritis. Upper thoracic region appears unremarkable. IMPRESSION: No detectable acute or traumatic finding. Likely chronic kyphotic curvature in the cervical region. Chronic fusion at C2-3. Facet arthropathy at C3-4 worse on the right with edema that could be symptomatic. Right foraminal stenosis could affect the right C4 nerve. C4-5 spondylosis and facet degeneration. Mild bilateral foraminal narrowing only. C5-6 spondylosis with foraminal narrowing right worse than left that could in particular affect the right C6 nerve. C6-7 spondylosis with foraminal narrowing left worse than right that could in particular affect the left C7 nerve. Electronically Signed   By: Nelson Chimes M.D.   On: 06/21/2018 17:49   Dg Shoulder Left  Result Date: 06/21/2018 CLINICAL DATA:  Shoulder pain from fall 1  week ago. EXAM: LEFT SHOULDER - 2+ VIEW COMPARISON:  None. FINDINGS: Osseous alignment is normal. No fracture line or displaced fracture fragment seen. Mild degenerative spurring at the acromioclavicular joint space. Soft tissues about the LEFT shoulder are unremarkable. IMPRESSION: No acute findings. Mild degenerative change at the acromioclavicular joint space. Electronically Signed   By: Franki Cabot M.D.   On: 06/21/2018 10:10     ASSESSMENT AND PLAN   67  y.o. female with past medical history significant for hypertension, hyperlipidemia, psoriasis, recent admission on 12/4 for GI bleeding and hypotension presents with left arm weakness. MRI brain shows right hemispheric watershed infarcts.  CT angiogram shows severe high-grade stenosis in the right ICA.  She also has multivessel stroke extracranial atherosclerotic disease occluding a V1 stenosis.    Acute Ischemic Stroke - R watershed infarctions Right Carotid stenosis Left hand weakness on non-dominant side  Risk factors: HTN Etiology: Atherosclerotic disease  Recommend # Vascular surgery consult in the morning  #Transthoracic Echo  # Will hold starting on Antiplatelets due to recent GI bleeding #Start or continue Atorvastatin 40 mg/other high intensity statin # BP goal: permissive HTN upto 220/120 mmHg and atleast MAP of 90-100 mmHg, AVOID HYPOTENSION and hold BP medications # HBAIC and Lipid profile # Telemetry monitoring # Frequent neuro checks # NPO until passes stroke swallow screen  Please page stroke NP  Or  PA  Or MD from 8am -4 pm  as this patient from this time will be  followed by the stroke.   You can look them up on [email or web address]  Password [facility]     Sushanth Aroor [facility] Pager Number [phone]

## 2018-06-22 NOTE — Progress Notes (Signed)
Pt left for CT.

## 2018-06-22 NOTE — Progress Notes (Signed)
STROKE TEAM PROGRESS NOTE   HISTORY OF PRESENT ILLNESS (per record) [name] is an 67 y.o. female with past medical history significant for hypertension, hyperlipidemia, obesity, psoriasis, recent admission for GI bleeding and hypotension on 12/4 presents to the emergency department at [facility] today with side weakness.  Patient scented to her PCP this morning following discharge yesterday.  She was noted to have significant weakness of her left hand which appeared to have been present on discharge.  She underwent an MRI brain which showed multiple infarctions in the right cerebral hemisphere. Patient was admitted to the hospitalist team and transferred to [facility] for further evaluation. Her current blood pressure is in the 1 46-568 systolics.  He is not on any aspirin due to GI bleed.  Date last known well: 06/19/18 tPA Given: no, outside window  Baseline MRS 0   SUBJECTIVE (INTERVAL HISTORY) Her husband is at the bedside. The pt has significant weakness of her left hand.    OBJECTIVE Vitals:   06/22/18 0049 06/22/18 0354 06/22/18 0751 06/22/18 1218  BP:  137/79 (!) 147/83 135/70  Pulse:  83 76 95  Resp:  18 18 18   Temp:  98.4 F (36.9 C) 98.2 F (36.8 C) 98.9 F (37.2 C)  TempSrc:  Oral Oral Oral  SpO2:  96% 98% 98%  Weight: 99.2 kg     Height: 5\' 4"  (1.626 m)       CBC:  Recent Labs  Lab 06/21/18 1213 06/22/18 0004  WBC 9.3 8.4  NEUTROABS 7.1  --   HGB 8.2* 7.5*  HCT 26.3* 24.7*  MCV 105.6* 103.8*  PLT 211 127    Basic Metabolic Panel:  Recent Labs  Lab 06/21/18 1213 06/22/18 0004  NA 141 139  K 3.6 3.2*  CL 106 105  CO2 26 26  GLUCOSE 106* 102*  BUN 11 9  CREATININE 0.76 0.88  CALCIUM 8.8* 8.4*    Lipid Panel:     Component Value Date/Time   CHOL 163 06/22/2018 0004   TRIG 104 06/22/2018 0004   HDL 34 (L) 06/22/2018 0004   CHOLHDL 4.8 06/22/2018 0004   VLDL 21 06/22/2018 0004   LDLCALC 108 (H) 06/22/2018 0004    HgbA1c:  Lab Results  Component Value Date   HGBA1C 5.1 06/22/2018   Urine Drug Screen: No results found for: LABOPIA, COCAINSCRNUR, LABBENZ, AMPHETMU, THCU, LABBARB  Alcohol Level No results found for: ETH  IMAGING  Ct Angio Head W Or Wo Contrast Ct Angio Neck W Or Wo Contrast 06/22/2018 IMPRESSION:  1. Severe near occlusive stenosis at the origin of the right ICA, almost certainly the causative agent for the previously identified right cerebral watershed type infarcts.  2. Severe high-grade stenosis at the origin of the left vertebral artery, with additional multifocal moderate to severe stenoses just distal within the left V1 segment. Remainder of the vertebrobasilar system patent without additional high-grade stenosis.  3. Prominent atherosclerotic change about the aortic arch. Associated 50% stenosis at the origin of the right brachiocephalic artery. 40% stenosis involving the proximal left subclavian artery.  4. Approximate 5.5 cm right paratracheal mass, indeterminate, but could reflect adenopathy or possibly a primary mediastinal lesion. Further assessment with dedicated cross-sectional imaging of the chest recommended.   Ct Head Wo Contrast 06/21/2018 IMPRESSION:  1. No acute findings. No intracranial mass, hemorrhage or edema.  2. Mild chronic small vessel ischemic changes in the white matter.    Mr Brain Wo Contrast (  neuro Protocol) 06/21/2018 IMPRESSION:  Numerous small infarctions scattered within the right cerebral hemisphere, probably watershed distribution. No large or confluent infarction. The differential diagnosis does include multiple micro embolic infarctions, but the pattern is more consistent with watershed insult.  Mr Cervical Spine Wo Contrast 06/21/2018 IMPRESSION:  No detectable acute or traumatic finding.  Likely chronic kyphotic curvature in the cervical region.  Chronic fusion at C2-3. Facet arthropathy at C3-4 worse on the right with edema that could  be symptomatic.  Right foraminal stenosis could affect the right C4 nerve. C4-5 spondylosis and facet degeneration. Mild bilateral foraminal narrowing only.  C5-6 spondylosis with foraminal narrowing right worse than left that could in particular affect the right C6 nerve.  C6-7 spondylosis with foraminal narrowing left worse than right that could in particular affect the left C7 nerve.   Dg Shoulder Left 06/21/2018 IMPRESSION:  No acute findings. Mild degenerative change at the acromioclavicular joint space.     Transthoracic Echocardiogram - pending 00/00/00      PHYSICAL EXAM Blood pressure 135/70, pulse 95, temperature 98.9 F (37.2 C), temperature source Oral, resp. rate 18, height 5\' 4"  (1.626 m), weight 99.2 kg, SpO2 98 %.  Obese middle aged lady not in distress. . Afebrile. Head is nontraumatic. Neck is supple without bruit.    Cardiac exam no murmur or gallop. Lungs are clear to auscultation. Distal pulses are well felt. Neurological Exam ;  Awake  Alert oriented x 3. Normal speech and language.eye movements full without nystagmus.fundi were not visualized. Vision acuity and fields appear normal. Hearing is normal. Palatal movements are normal. Face symmetric. Tongue midline. Normal strength, tone, reflexes and coordination except significant left hand , wrist and intrinsic  Hand muscle weakness .  Orbits right over left upper extremity.  Fine finger movements are diminished on the left.  Normal sensation. Gait deferred.       ASSESSMENT/PLAN [name] is a 67 y.o. female with history of hypertension, hyperlipidemia,tobacco use, obesity,chronic pain, psoriasis, and recent admission for GI bleeding and hypotension presenting with significant weakness of her left hand . She did not receive IV t-PA due to late presentation and recent GI bleed.  Stroke: Right hemispheric watershed symptomatic from critical proximal right ICA stenosis  Resultant left hand  weakness  CT head - No acute findings.   MRI head - Numerous small infarctions scattered within the right cerebral hemisphere, probably watershed distribution.   MRA head  - not ordered  CTA H&N - multiple areas of severe stenosis including Lt ICA  Carotid Doppler - CTA neck performed - carotid dopplers not indicated.  2D Echo - pending  LDL - 108  HgbA1c - 5.1  UDS - not ordered  VTE prophylaxis - SCDs  Diet - Heart healthy with thin liquids.  No antithrombotic prior to admission, now on No antithrombotic  Patient counseled to be compliant with her antithrombotic medications  Ongoing aggressive stroke risk factor management  Therapy recommendations:  Outpt PT and OT  Disposition:  Pending  Hypertension  Stable . Permissive hypertension (OK if < 220/120) but gradually normalize in 5-7 days . Long-term BP goal normotensive  Hyperlipidemia  Lipid lowering medication PTA:  none  LDL 108, goal < 70  Current lipid lowering medication: Will start Lipitor 40 mg daily  Continue statin at discharge    Other Stroke Risk Factors  Advanced age  Cigarette smoker - advised to stop smoking  ETOH use, advised to drink no more than 1  alcoholic beverage per day.  Obesity, Body mass index is 37.54 kg/m., recommend weight loss, diet and exercise as appropriate    Other Active Problems  Severe near occlusive stenosis at the origin of the right ICA.]  Severe high-grade stenosis at the origin of the left vertebral artery.  Recent GI bleed and hypotension.  Approximate 5.5 cm right paratracheal mass, indeterminate, but could reflect adenopathy or possibly a primary mediastinal lesion. Further assessment with dedicated cross-sectional imaging of the chest recommended.   Anemia  Hypokalemia   Plan  Vascular surgery consult for R ICA stenosis - pending  Recommend early revascularization in 3 to 7 days  Aggressive risk factor modification.  Start aspirin for  stroke prevention    Hospital day # [place] PA-C [facility] Pager [phone] 06/22/2018, 5:03 PM I have personally obtained history,examined this patient, reviewed notes, independently viewed imaging studies, participated in medical decision making and plan of care.ROS completed by me personally and pertinent positives fully documented  I have made any additions or clarifications directly to the above note. Agree with note above.  She has presented with left hand weakness likely from symptomatic high-grade proximal right ICA stenosis in the setting of recent GI bleed and hypotension.  She will benefit from early right carotid revascularization and await vascular surgery consultation.  Start aspirin 81 mg daily if she is hemodynamically stable.  Avoid hypotension and keep systolic blood pressure in the 120-140 range Long discussion with the patient, husband and Dr. Roger Shelter and answered questions.  Greater than 50% time during this 35-minute visit was spent on counseling and coordination of care about her symptomatic carotid stenosis and discussion about revascularization and treatment plan and answering questions.  Antony Contras, MD Medical Director [facility] Pager: [phone] 06/22/2018 5:25 PM   To contact Stroke Continuity provider, please refer to [email or web address] After hours, contact General Neurology

## 2018-06-22 NOTE — Progress Notes (Addendum)
OT Cancellation Note  Patient Details Name: [name] MRN: [id number] DOB: [birth year]   Cancelled Treatment:    Reason Eval/Treat Not Completed: Patient at procedure or test/ unavailable (MD having just arrived to patient's room. Will return as schedule allows.)  [place], OTR/L Acute Rehab Pager: [phone] Office: [phone] 06/22/2018, 12:47 PM

## 2018-06-22 NOTE — Consult Note (Signed)
[facility] [place]  Patient name: [name] MRN: [id number] DOB: Sep 19, 1950 Sex: female   REQUESTING PROVIDER:    Hospital service   REASON FOR CONSULT:    Symptomatic right carotid stenosis  HISTORY OF PRESENT ILLNESS:   [name] is a 67 y.o. female, who presented with significant weakness of her left hand.  MRI revealed multiple infarcts in the right brain.  CTA showed severe right carotid stenosis.  The patient did not receive TPA.  SHe has stopped her ASA due to a GI bleed.  EGD showed multiple  Duodenal, gastric and esophageal ulcers.  She continues to have weakness in her left hand however this is improving.  She is medically managed for hypertension.  She takes a statin for hypercholesterolemia.  SHe is a smoker  PAST MEDICAL HISTORY    Past Medical History:  Diagnosis Date  . Chronic pain   . Hyperlipidemia   . Hypertension      FAMILY HISTORY   Family History  Problem Relation Age of Onset  . Cancer Mother        lung cancer  . Diabetes Other   . Hyperlipidemia Other   . Hypertension Other   . Cancer Other     SOCIAL HISTORY:   Social History   Socioeconomic History  . Marital status: Single    Spouse name: Not on file  . Number of children: 0  . Years of education: Not on file  . Highest education level: Not on file  Occupational History  . Not on file  Social Needs  . Financial resource strain: Not on file  . Food insecurity:    Worry: Not on file    Inability: Not on file  . Transportation needs:    Medical: Not on file    Non-medical: Not on file  Tobacco Use  . Smoking status: Current Some Day Smoker    Packs/day: 0.25    Types: Cigarettes  . Smokeless tobacco: Never Used  Substance and Sexual Activity  . Alcohol use: Yes    Comment: wine 2 glasses a day   . Drug use: Never    Comment: Hemp Oil  . Sexual activity: Not Currently  Lifestyle  . Physical  activity:    Days per week: Not on file    Minutes per session: Not on file  . Stress: Not on file  Relationships  . Social connections:    Talks on phone: Not on file    Gets together: Not on file    Attends religious service: Not on file    Active member of club or organization: Not on file    Attends meetings of clubs or organizations: Not on file    Relationship status: Not on file  . Intimate partner violence:    Fear of current or ex partner: Not on file    Emotionally abused: Not on file    Physically abused: Not on file    Forced sexual activity: Not on file  Other Topics Concern  . 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 0.9 %  sodium  chloride infusion   Intravenous Continuous Rise Patience, MD 75 mL/hr at 06/22/18 1200    . acetaminophen (TYLENOL) tablet 650 mg  650 mg Oral Q4H PRN Rise Patience, MD   650 mg at 06/22/18 0631   Or  . acetaminophen (TYLENOL) solution 650 mg  650 mg Per Tube Q4H PRN Rise Patience, MD       Or  . acetaminophen (TYLENOL) suppository 650 mg  650 mg Rectal Q4H PRN Rise Patience, MD      . atorvastatin (LIPITOR) tablet 40 mg  40 mg Oral q1800 Rinehuls, David L, PA-C   40 mg at 06/22/18 1719  . pantoprazole (PROTONIX) EC tablet 40 mg  40 mg Oral BID AC Rise Patience, MD   40 mg at 06/22/18 1723    REVIEW OF SYSTEMS:   [X]  denotes positive finding, [ ]  denotes negative finding Cardiac  Comments:  Chest pain or chest pressure:    Shortness of breath upon exertion:    Short of breath when lying flat:    Irregular heart rhythm:        Vascular    Pain in calf, thigh, or hip brought on by ambulation:    Pain in feet at night that  wakes you up from your sleep:     Blood clot in your veins:    Leg swelling:         Pulmonary    Oxygen at home:    Productive cough:     Wheezing:         Neurologic    Sudden weakness in arms or legs:  x   Sudden numbness in arms or legs:     Sudden onset of difficulty speaking or slurred speech:    Temporary loss of vision in one eye:     Problems with dizziness:         Gastrointestinal    Blood in stool:      Vomited blood:         Genitourinary    Burning when urinating:     Blood in urine:        Psychiatric    Major depression:         Hematologic    Bleeding problems:    Problems with blood clotting too easily:        Skin    Rashes or ulcers:        Constitutional    Fever or chills:     PHYSICAL EXAM:   Vitals:   06/22/18 0354 06/22/18 0751 06/22/18 1218 06/22/18 1645  BP: 137/79 (!) 147/83 135/70 (!) 158/85  Pulse: 83 76 95 91  Resp: 18 18 18 18   Temp: 98.4 F (36.9 C) 98.2 F (36.8 C) 98.9 F (37.2 C) 98.9 F (37.2 C)  TempSrc: Oral Oral Oral Oral  SpO2: 96% 98% 98% 99%  Weight:      Height:        GENERAL: The patient is a well-nourished female, in no acute distress. The vital signs are documented above. CARDIAC: There is a regular rate and rhythm.  PULMONARY: Nonlabored respirations ABDOMEN: Soft and non-tender with normal pitched bowel sounds.  MUSCULOSKELETAL: There are no major deformities or cyanosis. NEUROLOGIC: left arm weakness. SKIN: There are no ulcers or rashes noted. PSYCHIATRIC: The patient has a normal affect.  STUDIES:   I have reviewed the following:  MRI Brain Numerous small infarctions scattered within the right cerebral hemisphere, probably  watershed distribution. No large or confluent infarction. The differential diagnosis does include multiple micro embolic infarctions, but the pattern is more consistent with watershed insult.  CTA neck: 1. Severe near occlusive stenosis at the origin of the right  ICA, almost certainly the causative agent for the previously identified right cerebral watershed type infarcts. 2. Severe high-grade stenosis at the origin of the left vertebral artery, with additional multifocal moderate to severe stenoses just distal within the left V1 segment. Remainder of the vertebrobasilar system patent without additional high-grade stenosis. 3. Prominent atherosclerotic change about the aortic arch. Associated 50% stenosis at the origin of the right brachiocephalic artery. 40% stenosis involving the proximal left subclavian artery. 4. Approximate 5.5 cm right paratracheal mass, indeterminate, but could reflect adenopathy or possibly a primary mediastinal lesion. Further assessment with dedicated cross-sectional imaging of the chest recommended.  ASSESSMENT and PLAN   Symptomatic right carotid stenosis; I discussed proceeding with right carotid endarterectomy.  She is not a candidate for stenting because of her GI bleed.  I discussed the risks and benefits of the operation including but not limited to the risk of stroke, nerve injury, cardiopulmonary complications, bleeding especially given her history of GI bleed.  I have her scheduled for Wednesday.  From my perspective she can be discharged home and return for surgery on Wednesday.   Annamarie Major, MD [facility] [phone] Pager [phone]

## 2018-06-22 NOTE — Progress Notes (Signed)
Pt back from CT

## 2018-06-23 ENCOUNTER — Inpatient Hospital Stay (HOSPITAL_COMMUNITY): Payer: Medicare Other

## 2018-06-23 DIAGNOSIS — R590 Localized enlarged lymph nodes: Secondary | ICD-10-CM

## 2018-06-23 DIAGNOSIS — I639 Cerebral infarction, unspecified: Secondary | ICD-10-CM

## 2018-06-23 DIAGNOSIS — K922 Gastrointestinal hemorrhage, unspecified: Secondary | ICD-10-CM

## 2018-06-23 DIAGNOSIS — R918 Other nonspecific abnormal finding of lung field: Secondary | ICD-10-CM

## 2018-06-23 DIAGNOSIS — D62 Acute posthemorrhagic anemia: Secondary | ICD-10-CM

## 2018-06-23 LAB — GLUCOSE, CAPILLARY
GLUCOSE-CAPILLARY: 93 mg/dL (ref 70–99)
Glucose-Capillary: 140 mg/dL — ABNORMAL HIGH (ref 70–99)

## 2018-06-23 LAB — ECHOCARDIOGRAM COMPLETE
Height: 64 in
Weight: 3499.1411 oz

## 2018-06-23 NOTE — Progress Notes (Signed)
SLP Cancellation Note  Patient Details Name: [name] MRN: [id number] DOB: March 31, 1951   Cancelled treatment:        Patient is out of room for CT. Will check back at next available time.   Charlynne Cousins Nickalus Thornsberry, MA, CCC-SLP 06/23/2018 1:19 PM

## 2018-06-23 NOTE — Consult Note (Addendum)
NAME:  [name], MRN:  [id number], DOB:  09-08-1950, LOS: 2 ADMISSION DATE:  06/21/2018, CONSULTATION DATE: 06/23/2018 REFERRING MD: Roger Shelter, CHIEF COMPLAINT: Lung mass  History of present illness   This is a 67 year old female with a past medical history of hypertension tobacco abuse, pack to pack and a half for approximately 10 years, presented with acute GI bleeding status post EGD which revealed multiple ulcers in the duodenum with history of Goody powder use.  She ultimately came back to the ER with complaints of left arm weakness and inability to lift the left wrist.  MRI of the brain and C-spine was done which showed multiple infarcts within the watershed area of the right cerebrum.  CTA of head and neck revealed a large 5.5 cm paratracheal mass.  Pulmonary was consulted recommendations and management.  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  Past Medical History   Past Medical History:  Diagnosis Date  . Chronic pain   . Hyperlipidemia   . Hypertension      Significant Hospital Events   CT chest 06/23/2018  Consults:  Vascular surgery 06/22/2018: Planned carotid endarterectomy 06/26/2018  Procedures:  06/19/2018: EGD duodenal ulcers  Significant Diagnostic Tests:  CTA head and neck: Severe high-grade stenosis of left vertebral artery CT chest: 4 cm right lower lobe lung mass, metastatic mediastinal and hilar adenopathy.  Micro Data:  None  Antimicrobials:  None  Interim history/subjective:  Overall anxious.  Feels like she is breathing okay.  Has no significant respiratory complaints.  Denies weight loss.  Objective   Blood pressure (!) 159/83, pulse 86, temperature 98.2 F (36.8 C), temperature source Oral, resp. rate 18, height 5\' 4"  (1.626 m), weight 99.2 kg, SpO2 94 %.        Intake/Output Summary (Last 24 hours) at 06/23/2018 1249 Last data filed at 06/22/2018 1800 Gross per 24 hour  Intake 808.96 ml    Output -  Net 808.96 ml   Filed Weights   06/22/18 0049  Weight: 99.2 kg    Examination: General: Elderly female, appears older than stated age.  No acute distress, sitting up in chair with husband in the room. HENT: NCAT, sclera clear, trachea midline Lungs: Clear to auscultation bilaterally, no crackles, no wheeze Cardiovascular: Regular rate and rhythm, S1-S2, no murmurs or gallops Abdomen:, Nontender, nondistended, bowel sounds present Extremities: No lower extremity edema Neuro: Alert, oriented, following commands, left hand grip decreased strength.  Resolved Hospital Problem list    Assessment & Plan:   Right lower lobe lung mass with associated enlarged mediastinal and hilar adenopathy concerning for a primary bronchogenic carcinoma. - Patient would benefit from bronchoscopy with endobronchial ultrasound-guided transbronchial needle aspirations of the mediastinal adenopathy for diagnosis and staging of her to condition. - Discussed the risk, benefits, alternatives of proceeding with inpatient bronchoscopy for biopsy and diagnosis. -We will plan for bronchoscopy during this admission if possible. - We will need to discuss anesthesia risk due to recent acute stroke as well as proximal right internal carotid stenosis. - I wonder if we would be able to coordinate bronchoscopy prior to carotid endarterectomy or at the time of endarterectomy - Would prefer biopsies of the lung mass to be completed prior to endarterectomy due to holding all current antiplatelet medications and currently not requiring anticoagulation.  Acute stroke, right hemispheric watershed symptomatic stroke with a critical proximal right internal carotid stenosis - Currently off antiplatelets due to recent upper  GI bleeding from duodenal ulcers status post NSAID use.  Tobacco abuse - Patient was counseled on smoking cessation  Best practice:  Diet: Per primary GI prophylaxis: PI Family Communication: Been  updated at bedside  Labs   CBC: Recent Labs  Lab 06/19/18 0836  06/19/18 2333 06/20/18 0628 06/21/18 1024 06/21/18 1213 06/22/18 0004  WBC 9.9  --   --  7.9 9.3 9.3 8.4  NEUTROABS  --   --   --   --   --  7.1  --   HGB 10.1*   < > 8.1* 8.1* 8.5* 8.2* 7.5*  HCT 33.6*   < > 26.7* 26.6* 25.5* 26.3* 24.7*  MCV 107.3*  --   --  108.6* 98.9 105.6* 103.8*  PLT 179  --   --  179  --  211 226   < > = values in this interval not displayed.    Basic Metabolic Panel: Recent Labs  Lab 06/19/18 0836 06/20/18 0628 06/21/18 1213 06/22/18 0004  NA 139 140 141 139  K 4.1 3.8 3.6 3.2*  CL 112* 112* 106 105  CO2 20* 24 26 26   GLUCOSE 149* 125* 106* 102*  BUN 28* 16 11 9   CREATININE 0.74 0.58 0.76 0.88  CALCIUM 7.8* 8.0* 8.8* 8.4*   GFR: Estimated Creatinine Clearance: 72.9 mL/min (by C-G formula based on SCr of 0.88 mg/dL). Recent Labs  Lab 06/20/18 0628 06/21/18 1024 06/21/18 1213 06/22/18 0004  WBC 7.9 9.3 9.3 8.4    Liver Function Tests: Recent Labs  Lab 06/19/18 0836 06/21/18 1213 06/22/18 0004  AST 15 18 15   ALT 14 15 13   ALKPHOS 37* 45 46  BILITOT 0.4 0.6 0.5  PROT 4.9* 5.9* 5.1*  ALBUMIN 2.6* 3.4* 2.8*   No results for input(s): LIPASE, AMYLASE in the last 168 hours. Recent Labs  Lab 06/19/18 0849  AMMONIA 16    ABG No results found for: PHART, PCO2ART, PO2ART, HCO3, TCO2, ACIDBASEDEF, O2SAT   Coagulation Profile: Recent Labs  Lab 06/19/18 0836 06/21/18 1213  INR 1.01 0.90    Cardiac Enzymes: Recent Labs  Lab 06/21/18 1213  TROPONINI <0.03    HbA1C: Hgb A1c MFr Bld  Date/Time Value Ref Range Status  06/22/2018 12:04 AM 5.1 4.8 - 5.6 % Final    Comment:    (NOTE) Pre diabetes:          5.7%-6.4% Diabetes:              >6.4% Glycemic control for   <7.0% adults with diabetes     CBG: Recent Labs  Lab 06/21/18 1328 06/23/18 1119  GLUCAP 92 93    Review of Systems:   Review of Systems  Constitutional: Positive for  malaise/fatigue. Negative for chills, fever and weight loss.  HENT: Negative for hearing loss, sore throat and tinnitus.   Eyes: Negative for blurred vision and double vision.  Respiratory: Negative for cough, hemoptysis, sputum production, shortness of breath, wheezing and stridor.   Cardiovascular: Negative for chest pain, palpitations, orthopnea, leg swelling and PND.  Gastrointestinal: Negative for abdominal pain, constipation, diarrhea, heartburn, nausea and vomiting.  Genitourinary: Negative for dysuria, hematuria and urgency.  Musculoskeletal: Negative for joint pain and myalgias.  Skin: Negative for itching and rash.  Neurological: Positive for focal weakness and weakness. Negative for dizziness, tingling and headaches.  Endo/Heme/Allergies: Negative for environmental allergies. Does not bruise/bleed easily.  Psychiatric/Behavioral: Negative for depression. The patient is not nervous/anxious and does not have insomnia.  All other systems reviewed and are negative.    Past Medical History  She,  has a past medical history of Chronic pain, Hyperlipidemia, and Hypertension.   Surgical History    Past Surgical History:  Procedure Laterality Date  . BIOPSY  06/19/2018   Procedure: BIOPSY;  Surgeon: Laurence Spates, MD;  Location: [facility];  Service: Endoscopy;;  . ESOPHAGOGASTRODUODENOSCOPY (EGD) WITH PROPOFOL N/A 06/19/2018   Procedure: ESOPHAGOGASTRODUODENOSCOPY (EGD) WITH PROPOFOL;  Surgeon: Laurence Spates, MD;  Location: [facility];  Service: Endoscopy;  Laterality: N/A;     Social History   reports that she has been smoking cigarettes. She has been smoking about 0.25 packs per day. She has never used smokeless tobacco. She reports that she drinks alcohol. She reports that she does not use drugs.   Family History   Her family history includes Cancer in her mother and other; Diabetes in her other; Hyperlipidemia in her other; Hypertension in her other.   Allergies No  Known Allergies   Home Medications  Prior to Admission medications   Medication Sig Start Date End Date Taking? Authorizing Provider  acetaminophen (TYLENOL) 650 MG CR tablet Take 1,300 mg by mouth daily as needed for pain.   Yes [provider]  pantoprazole (PROTONIX) 40 MG tablet Take 1 tablet (40 mg total) by mouth 2 (two) times daily before a meal. 06/20/18  Yes Danford, Suann Larry, MD  clobetasol cream (TEMOVATE) 9.75 % Apply 1 application topically 2 (two) times daily. 14 days Patient not taking: Reported on 06/19/2018 12/14/17   [name], [name], MD  lisinopril-hydrochlorothiazide ([place]) 20-25 MG tablet TAKE 1 TABLET BY MOUTH EVERY DAY *FOLLOW UP APPOINTMENT NEEDED* Patient not taking: Reported on 06/19/2018 11/05/17   [name], [name], MD     Garner Nash, [facility] 06/23/2018 12:49 PM  Personal pager: [phone] If unanswered, please page CCM On-call: [phone]

## 2018-06-23 NOTE — Progress Notes (Signed)
STROKE TEAM PROGRESS NOTE       SUBJECTIVE (INTERVAL HISTORY) Her husband is at the bedside. The pt still  has  weakness of her left hand.No new complaints    OBJECTIVE Vitals:   06/23/18 0029 06/23/18 0325 06/23/18 0741 06/23/18 1206  BP: (!) 174/82 (!) 158/86 (!) 160/85 (!) 159/83  Pulse: 88 79 81 86  Resp: 16 18 18 18   Temp: 98 F (36.7 C) (!) 97.5 F (36.4 C) 98.4 F (36.9 C) 98.2 F (36.8 C)  TempSrc: Oral Oral Oral Oral  SpO2: 99% 97% 100% 94%  Weight:      Height:        CBC:  Recent Labs  Lab 06/21/18 1213 06/22/18 0004  WBC 9.3 8.4  NEUTROABS 7.1  --   HGB 8.2* 7.5*  HCT 26.3* 24.7*  MCV 105.6* 103.8*  PLT 211 287    Basic Metabolic Panel:  Recent Labs  Lab 06/21/18 1213 06/22/18 0004  NA 141 139  K 3.6 3.2*  CL 106 105  CO2 26 26  GLUCOSE 106* 102*  BUN 11 9  CREATININE 0.76 0.88  CALCIUM 8.8* 8.4*    Lipid Panel:     Component Value Date/Time   CHOL 163 06/22/2018 0004   TRIG 104 06/22/2018 0004   HDL 34 (L) 06/22/2018 0004   CHOLHDL 4.8 06/22/2018 0004   VLDL 21 06/22/2018 0004   LDLCALC 108 (H) 06/22/2018 0004   HgbA1c:  Lab Results  Component Value Date   HGBA1C 5.1 06/22/2018   Urine Drug Screen: No results found for: LABOPIA, COCAINSCRNUR, LABBENZ, AMPHETMU, THCU, LABBARB  Alcohol Level No results found for: ETH  IMAGING  Ct Angio Head W Or Wo Contrast Ct Angio Neck W Or Wo Contrast 06/22/2018 IMPRESSION:  1. Severe near occlusive stenosis at the origin of the right ICA, almost certainly the causative agent for the previously identified right cerebral watershed type infarcts.  2. Severe high-grade stenosis at the origin of the left vertebral artery, with additional multifocal moderate to severe stenoses just distal within the left V1 segment. Remainder of the vertebrobasilar system patent without additional high-grade stenosis.  3. Prominent atherosclerotic change about the aortic arch. Associated 50% stenosis at the  origin of the right brachiocephalic artery. 40% stenosis involving the proximal left subclavian artery.  4. Approximate 5.5 cm right paratracheal mass, indeterminate, but could reflect adenopathy or possibly a primary mediastinal lesion. Further assessment with dedicated cross-sectional imaging of the chest recommended.   Ct Head Wo Contrast 06/21/2018 IMPRESSION:  1. No acute findings. No intracranial mass, hemorrhage or edema.  2. Mild chronic small vessel ischemic changes in the white matter.    Mr Brain Wo Contrast (neuro Protocol) 06/21/2018 IMPRESSION:  Numerous small infarctions scattered within the right cerebral hemisphere, probably watershed distribution. No large or confluent infarction. The differential diagnosis does include multiple micro embolic infarctions, but the pattern is more consistent with watershed insult.  Mr Cervical Spine Wo Contrast 06/21/2018 IMPRESSION:  No detectable acute or traumatic finding.  Likely chronic kyphotic curvature in the cervical region.  Chronic fusion at C2-3. Facet arthropathy at C3-4 worse on the right with edema that could be symptomatic.  Right foraminal stenosis could affect the right C4 nerve. C4-5 spondylosis and facet degeneration. Mild bilateral foraminal narrowing only.  C5-6 spondylosis with foraminal narrowing right worse than left that could in particular affect the right C6 nerve.  C6-7 spondylosis with foraminal narrowing left worse than right that could in  particular affect the left C7 nerve.   Dg Shoulder Left 06/21/2018 IMPRESSION:  No acute findings. Mild degenerative change at the acromioclavicular joint space.     Transthoracic Echocardiogram - pending 00/00/00      PHYSICAL EXAM Blood pressure (!) 159/83, pulse 86, temperature 98.2 F (36.8 C), temperature source Oral, resp. rate 18, height 5\' 4"  (1.626 m), weight 99.2 kg, SpO2 94 %.  Obese middle aged lady not in distress. . Afebrile. Head is nontraumatic.  Neck is supple without bruit.    Cardiac exam no murmur or gallop. Lungs are clear to auscultation. Distal pulses are well felt. Neurological Exam ;  Awake  Alert oriented x 3. Normal speech and language.eye movements full without nystagmus.fundi were not visualized. Vision acuity and fields appear normal. Hearing is normal. Palatal movements are normal. Face symmetric. Tongue midline. Normal strength, tone, reflexes and coordination except significant left hand , wrist and intrinsic  Hand muscle weakness .  Orbits right over left upper extremity.  Fine finger movements are diminished on the left.  Normal sensation. Gait deferred.       ASSESSMENT/PLAN Ms. IRVA LOSER is a 67 y.o. female with history of hypertension, hyperlipidemia,tobacco use, obesity,chronic pain, psoriasis, and recent admission for GI bleeding and hypotension presenting with significant weakness of her left hand . She did not receive IV t-PA due to late presentation and recent GI bleed.  Stroke: Right hemispheric watershed symptomatic from critical proximal right ICA stenosis  Resultant left hand weakness  CT head - No acute findings.   MRI head - Numerous small infarctions scattered within the right cerebral hemisphere, probably watershed distribution.   MRA head  - not ordered  CTA H&N - multiple areas of severe stenosis including Lt ICA  Carotid Doppler - CTA neck performed - carotid dopplers not indicated.  2D Echo - pending  LDL - 108  HgbA1c - 5.1  UDS - not ordered  VTE prophylaxis - SCDs  Diet - Heart healthy with thin liquids.  No antithrombotic prior to admission, now on No antithrombotic  Patient counseled to be compliant with her antithrombotic medications  Ongoing aggressive stroke risk factor management  Therapy recommendations:  Outpt PT and OT  Disposition:  Pending  Hypertension  Stable . Permissive hypertension (OK if < 220/120) but gradually normalize in 5-7  days . Long-term BP goal normotensive  Hyperlipidemia  Lipid lowering medication PTA:  none  LDL 108, goal < 70  Current lipid lowering medication: Will start Lipitor 40 mg daily  Continue statin at discharge    Other Stroke Risk Factors  Advanced age  Cigarette smoker - advised to stop smoking  ETOH use, advised to drink no more than 1 alcoholic beverage per day.  Obesity, Body mass index is 37.54 kg/m., recommend weight loss, diet and exercise as appropriate    Other Active Problems  Severe near occlusive stenosis at the origin of the right ICA.]  Severe high-grade stenosis at the origin of the left vertebral artery.  Recent GI bleed and hypotension.  Approximate 5.5 cm right paratracheal mass, indeterminate, but could reflect adenopathy or possibly a primary mediastinal lesion. Further assessment with dedicated cross-sectional imaging of the chest recommended.   Anemia  Hypokalemia   Plan  Vascular surgery plan right CEA next wednesday for R ICA stenosis    Aggressive risk factor modification.  Continue aspirin for stroke prevention along with protonix due to recent GI bleed    Hospital day # 2   .  She has presented with left hand weakness likely from symptomatic high-grade proximal right ICA stenosis in the setting of recent GI bleed and hypotension.  She will benefit from early right carotid revascularization and await vascular surgery consultation.  Start aspirin 81 mg daily   Avoid hypotension and keep systolic blood pressure in the 120-140 range Long discussion with the patient, husband and Dr. Roger Shelter and answered questions.  Greater than 50% time during this 25-minute visit was spent on counseling and coordination of care about her symptomatic carotid stenosis and discussion about revascularization and treatment plan and answering questions. Stroke team will sign off. Call for questions Antony Contras, MD Medical Director [place] Pager: [phone] 06/23/2018 12:36 PM   To contact Stroke Continuity provider, please refer to [email or web address] After hours, contact General Neurology

## 2018-06-23 NOTE — Progress Notes (Signed)
Occupational Therapy Treatment Patient Details Name: [name] MRN: [id number] DOB: [birth year] Today's Date: 06/23/2018    History of present illness [name] is an 67 y.o. female with PMH: hypertension, hyperlipidemia, obesity, psoriasis, recently discharged from [facility] for GI bleed and presented to PCP same day with L sided weakness that was apparently present on discharge. MRI showed multiple infarcts R cerebral hemisphere.   OT comments  Pt progressing towards established OT goals and continues to present with increased motivation to participate in therapy and return to PLOF. Providing education on tub transfer and pt performing with Min Guard A for safety. Pt eager to show OT increased ROM at hand and wrist. Providing pt with more exercises for FM coordination including hand outs on FM and theraputty. Continue to recommend dc to home with follow up at Neuro OP OT.    Follow Up Recommendations  Outpatient OT;Supervision/Assistance - 24 hour ; Neuro OP   Equipment Recommendations  None recommended by OT    Recommendations for Other Services PT consult    Precautions / Restrictions Precautions Precautions: Fall Precaution Comments: has back pain and R knee pain that affect her balance and function Restrictions Weight Bearing Restrictions: No       Mobility Bed Mobility               General bed mobility comments: Sitting in recliner upon arrival  Transfers Overall transfer level: Needs assistance Equipment used: Rolling walker (2 wheeled) Transfers: Sit to/from Stand Sit to Stand: Min assist         General transfer comment: Min A for balance and safety    Balance Overall balance assessment: Needs assistance Sitting-balance support: No upper extremity supported Sitting balance-Leahy Scale: Fair     Standing balance support: No upper extremity supported Standing balance-Leahy Scale: Fair Standing balance comment: able to maintain static  stance as well as wash hands at sink without support or LOB                           ADL either performed or assessed with clinical judgement   ADL Overall ADL's : Needs assistance/impaired     Grooming: Minimal assistance;Sitting Grooming Details (indicate cue type and reason): Pt attmepting to put her hair into a ponytail. Pt initiating task and requiring Min A to complete task         Upper Body Dressing : Minimal assistance;Sitting Upper Body Dressing Details (indicate cue type and reason): donning second gown like jacket             Tub/ Shower Transfer: Tub transfer;Min guard;Ambulation;Shower Scientist, research (medical) Details (indicate cue type and reason): Providing pt with education on safe tub transfer. Pt demonstrating understanding with Min GUard A for safety Functional mobility during ADLs: Min guard;Rolling walker(Using RW to increase WBing through LUE) General ADL Comments: Providing pt with educaiton on safe tub transfer and further exercises for LUE.      Vision       Perception     Praxis      Cognition Arousal/Alertness: Awake/alert Behavior During Therapy: WFL for tasks assessed/performed Overall Cognitive Status: Within Functional Limits for tasks assessed                                 General Comments: Presenting with decreased processing speed and requiring increased time during conversation. Very motivated to participate  and return to PLOF        Exercises Exercises: General Upper Extremity;Other exercises General Exercises - Upper Extremity Shoulder Flexion: AROM;Left;Seated;5 reps Shoulder Extension: AROM;Left;Seated;5 reps Elbow Flexion: AROM;Left;Seated;5 reps Elbow Extension: AROM;Left;Seated;5 reps Wrist Flexion: AAROM;Left;10 reps;Seated(placed forearm in neutral position) Wrist Extension: AAROM;Left;10 reps;Seated(placed forearm in neutral position) Digit Composite Flexion: AROM;Left;10  reps;Seated Composite Extension: AROM;Left;10 reps;Seated Other Exercises Other Exercises: Providing pt with handout and educaiton on therapy (super soft). Reviewing rolling into ball, rolling into log, and then pinching with index and thumb.  Other Exercises: Grasp and release tasks with smaller objects compared to prior session. Using paper clips.  Other Exercises: FM coorindation handout with finger opposition, digit lifts, etc   Shoulder Instructions       General Comments Brother present at begining of session    Pertinent Vitals/ Pain       Pain Assessment: Faces Faces Pain Scale: Hurts even more Pain Location: back and R knee and L shoulder Pain Descriptors / Indicators: Aching Pain Intervention(s): Monitored during session;Limited activity within patient's tolerance;Repositioned  Home Living                                          Prior Functioning/Environment              Frequency  Min 3X/week        Progress Toward Goals  OT Goals(current goals can now be found in the care plan section)  Progress towards OT goals: Progressing toward goals  Acute Rehab OT Goals Patient Stated Goal: Cook again OT Goal Formulation: With patient Time For Goal Achievement: 07/06/18 Potential to Achieve Goals: Good ADL Goals Pt Will Perform Grooming: with modified independence;standing Pt Will Perform Upper Body Dressing: with modified independence;sitting Pt Will Perform Tub/Shower Transfer: with modified independence;ambulating;shower seat;Tub transfer Pt/caregiver will Perform Home Exercise Program: Increased ROM;Increased strength;Left upper extremity;With theraputty;Independently;With written HEP provided Additional ADL Goal #1: Pt will incorporate LUE into ADL tasks 75% of time  Plan Discharge plan remains appropriate    Co-evaluation                 AM-PAC OT "6 Clicks" Daily Activity     Outcome Measure   Help from another person  eating meals?: A Little Help from another person taking care of personal grooming?: A Little Help from another person toileting, which includes using toliet, bedpan, or urinal?: A Little Help from another person bathing (including washing, rinsing, drying)?: A Little Help from another person to put on and taking off regular upper body clothing?: A Little Help from another person to put on and taking off regular lower body clothing?: A Little 6 Click Score: 18    End of Session Equipment Utilized During Treatment: Gait belt;Rolling walker  OT Visit Diagnosis: Unsteadiness on feet (R26.81);Other abnormalities of gait and mobility (R26.89);Muscle weakness (generalized) (M62.81);Hemiplegia and hemiparesis;Pain Hemiplegia - Right/Left: Left Hemiplegia - dominant/non-dominant: Non-Dominant Hemiplegia - caused by: Cerebral infarction Pain - part of body: (Back)   Activity Tolerance Patient tolerated treatment well   Patient Left in chair;with call bell/phone within reach;with family/visitor present;with chair alarm set   Nurse Communication Mobility status;Precautions        Time: 1275-1700 OT Time Calculation (min): 33 min  Charges: OT General Charges $OT Visit: 1 Visit OT Treatments $Self Care/Home Management : 8-22 mins $Therapeutic Activity: 8-22 mins  Raylyn Speckman  MSOT, OTR/L Acute Rehab Pager: [phone] Office: [place] 06/23/2018, 2:26 PM

## 2018-06-23 NOTE — Progress Notes (Signed)
PROGRESS NOTE    Patient: [name]                            PCP: [name], [name], MD                    DOB: 07-Feb-1951            DOA: 06/21/2018 [id number]             DOS: 06/23/2018, 1:36 PM   LOS: 2 days   Date of Service: The patient was seen and examined on 06/23/2018  Subjective:   The patient was seen and examined this morning.  Stable.  Accompanied by brother and husband at bedside. Reporting she is working vigilantly to regain function and in her arm, she has been excessively exercising it. Reporting some improvement. Reporting of no melena or bleeding. Currently stable denies any chest pain or shortness of breath.  Patient was made aware of mass in right pretracheal area.,  With a lymph node enlargement.   We recommend patient to remain in hospital for possible further evaluation of the mass and biopsy by PCCM, before starting anticoagulation therapy. She expressed she would like to remain in the hospital for further evaluation possibly biopsy the mass.  She states she has been smoking till this recent hospitalization.  Brief Narrative:   [name] is an 67 y.o. female with past medical history significant for hypertension, hyperlipidemia, obesity, psoriasis, recent admission for GI bleeding and hypotension on 12/4 presents to the emergency department at [facility] today with side weakness.  Patient scented to her PCP this morning following discharge yesterday.  She was noted to have significant weakness of her left hand which appeared to have been present on discharge.  She underwent an MRI brain which showed multiple infarctions in the right cerebral hemisphere.  Patient was admitted to the hospitalist team and transferred to [facility] for further evaluation. Her current blood pressure is in the 1 36-644 systolics.  He is not on any aspirin due to GI bleed.   Principal Problem:   Acute CVA (cerebrovascular accident)  ([place]) Active Problems:   Upper GI bleed   Acute blood loss anemia    Assessment & Plan:     Acute CVA\  -right hemispheric watershed possible source proximal right ICA stenosis -Improving arm function, and hand.  -Patient was outside the window for TPA treatment, -Also recent history of GI bleed was not anticoagulated including aspirin Neurologist team,following.    -MRI >>  Numerous small infarctions scattered within the right cerebral hemisphere, probably watershed distribution  CTA .>> Severe near occlusive stenosis at the origin of the right ICA, almost certainly the causative agent for the previously identified right cerebral watershed type infarcts. 2. Severe high-grade stenosis at the origin of the left vertebral artery, with additional multifocal moderate to severe stenoses just distal within the left V1 segment.  - Echo   >>pending   Allowing permissive hypertension, continue neurochecks, PT/OT Continue statins, avoiding antiplatelet therapy due to recent GI bleed eventually she needs to be on aspirin or Plavix.  At this time neurology anticipating starting the patient on aspirin and PPI once stable   Right ICA stenosis, severe -Vascular surgery has been consulted,  -Contributing to current CVA, -Patient has been seen and evaluated, anticipating carotid neurotomy on Wednesday.,  Patient was cleared to return back in durotomy but she wishes  to stay to continue her current work-up for the mass in her chest.  Recent GI bleed with acute blood loss anemia -Recent hospitalization, H&H stable, follow CBC.  Transfuse if less than 7.  On Protonix twice daily. -  EGD showed multiple ulcers in the duodenum with gastric ulcers and esophageal ulcers.   HTN -previous history of hypertension has not been taking the antihypertensives for many years.   Allow for permissive hypertension per neurology recommendations given that the patient has watershed infarcts.  Tobacco abuse   -advised to quit smoking.  Chronic back pain -PRN analgesics, try to avoid NSAIDs due to GI bleed  Approximate 5.5 cm right paratracheal mass, found on CTA   possibly a primary mediastinal lesion. Further evaluation possibly biopsy and further imaging PCCM: Consulted pulmonary for further evaluation and recommendation, possibly biopsy. we will follow-up with a CT of the chest without contrast   Hyperlipidemia -LDL 108, goal less than 70, currently started on  Lipitor 40 mg daily  Other:   DVT prophylaxis: SCDs due to recent GI bleed avoiding Lovenox. Code Status: Full code. Family Communication: Discussed with patient. Disposition Plan: Home. Consults called: Neurology. Admission status: Observation.    Procedures:   -Anticipating carotid enterotomy on 06/26/2018 by vascular surgery -Anticipating chest mass biopsy by PCCM  Antimicrobials:  Anti-infectives (From admission, onward)   None       Medication:  . atorvastatin  40 mg Oral q1800  . pantoprazole  40 mg Oral BID AC    acetaminophen **OR** acetaminophen (TYLENOL) oral liquid 160 mg/5 mL **OR** acetaminophen     Objective:   Vitals:   06/23/18 0029 06/23/18 0325 06/23/18 0741 06/23/18 1206  BP: (!) 174/82 (!) 158/86 (!) 160/85 (!) 159/83  Pulse: 88 79 81 86  Resp: 16 18 18 18   Temp: 98 F (36.7 C) (!) 97.5 F (36.4 C) 98.4 F (36.9 C) 98.2 F (36.8 C)  TempSrc: Oral Oral Oral Oral  SpO2: 99% 97% 100% 94%  Weight:      Height:        Intake/Output Summary (Last 24 hours) at 06/23/2018 1336 Last data filed at 06/22/2018 1800 Gross per 24 hour  Intake 808.96 ml  Output -  Net 808.96 ml   Filed Weights   06/22/18 0049  Weight: 99.2 kg     Examination:    General exam: Appears calm and comfortable  BP (!) 159/83 (BP Location: Left Arm)   Pulse 86   Temp 98.2 F (36.8 C) (Oral)   Resp 18   Ht 5\' 4"  (1.626 m)   Wt 99.2 kg   SpO2 94%   BMI 37.54 kg/m    Physical Exam   Constitution:  Alert, cooperative, no distress,  Psychiatric: Normal and stable mood and affect, cognition intact,   HEENT: Normocephalic, PERRL, otherwise with in Normal limits  Chest:Chest symmetric Cardio vascular:  S1/S2, RRR, No murmure, No Rubs or Gallops  pulmonary: Clear to auscultation bilaterally, respirations unlabored, negative wheezes / crackles Abdomen: Soft, non-tender, non-distended, bowel sounds,no masses, no organomegaly Muscular skeletal: Limited exam - in bed, able to move all 4 extremities, Normal strength,  Neuro: CNII-XII intact. ,  3/5 reduced left arm strength, intact sensation, reflexes intact  Extremities: No pitting edema lower extremities, +2 pulses  Skin: Dry, warm to touch, negative for any Rashes, No open wounds Wounds: per nursing documentation  LABs:  CBC Latest Ref Rng & Units 06/22/2018 06/21/2018 06/21/2018  WBC 4.0 - 10.5 K/uL 8.4  9.3 9.3  Hemoglobin 12.0 - 15.0 g/dL 7.5(L) 8.2(L) 8.5(A)  Hematocrit 36.0 - 46.0 % 24.7(L) 26.3(L) 25.5(A)  Platelets 150 - 400 K/uL 226 211 -   CMP Latest Ref Rng & Units 06/22/2018 06/21/2018 06/20/2018  Glucose 70 - 99 mg/dL 102(H) 106(H) 125(H)  BUN 8 - 23 mg/dL 9 11 16   Creatinine 0.44 - 1.00 mg/dL 0.88 0.76 0.58  Sodium 135 - 145 mmol/L 139 141 140  Potassium 3.5 - 5.1 mmol/L 3.2(L) 3.6 3.8  Chloride 98 - 111 mmol/L 105 106 112(H)  CO2 22 - 32 mmol/L 26 26 24   Calcium 8.9 - 10.3 mg/dL 8.4(L) 8.8(L) 8.0(L)  Total Protein 6.5 - 8.1 g/dL 5.1(L) 5.9(L) -  Total Bilirubin 0.3 - 1.2 mg/dL 0.5 0.6 -  Alkaline Phos 38 - 126 U/L 46 45 -  AST 15 - 41 U/L 15 18 -  ALT 0 - 44 U/L 13 15 -     Ct Angio Head W Or Wo Contrast  Result Date: 06/22/2018 IMPRESSION: 1. Severe near occlusive stenosis at the origin of the right ICA, almost certainly the causative agent for the previously identified right cerebral watershed type infarcts. 2. Severe high-grade stenosis at the origin of the left vertebral artery, with additional  multifocal moderate to severe stenoses just distal within the left V1 segment. Remainder of the vertebrobasilar system patent without additional high-grade stenosis. 3. Prominent atherosclerotic change about the aortic arch. Associated 50% stenosis at the origin of the right brachiocephalic artery. 40% stenosis involving the proximal left subclavian artery. 4. Approximate 5.5 cm right paratracheal mass, indeterminate, but could reflect adenopathy or possibly a primary mediastinal lesion. Further assessment with dedicated cross-sectional imaging of the chest recommended. Electronically Signed   By: Jeannine Boga M.D.   On: 06/22/2018 02:50   Ct Head Wo Contrast  Result Date: 06/21/2018  IMPRESSION: 1. No acute findings. No intracranial mass, hemorrhage or edema. 2. Mild chronic small vessel ischemic changes in the white matter. Electronically Signed   By: Franki Cabot M.D.   On: 06/21/2018 12:52   Ct Angio Neck W Or Wo Contrast  Result Date: 06/22/2018 IMPRESSION: 1. Severe near occlusive stenosis at the origin of the right ICA, almost certainly the causative agent for the previously identified right cerebral watershed type infarcts. 2. Severe high-grade stenosis at the origin of the left vertebral artery, with additional multifocal moderate to severe stenoses just distal within the left V1 segment. Remainder of the vertebrobasilar system patent without additional high-grade stenosis. 3. Prominent atherosclerotic change about the aortic arch. Associated 50% stenosis at the origin of the right brachiocephalic artery. 40% stenosis involving the proximal left subclavian artery. 4. Approximate 5.5 cm right paratracheal mass, indeterminate, but could reflect adenopathy or possibly a primary mediastinal lesion. Further assessment with dedicated cross-sectional imaging of the chest recommended. Electronically Signed   By: Jeannine Boga M.D.   On: 06/22/2018 02:50   Mr Brain Wo Contrast (neuro  Protocol) Result Date: 06/21/2018  IMPRESSION: Numerous small infarctions scattered within the right cerebral hemisphere, probably watershed distribution. No large or confluent infarction. The differential diagnosis does include multiple micro embolic infarctions, but the pattern is more consistent with watershed insult. Electronically Signed   By: Nelson Chimes M.D.   On: 06/21/2018 17:51   Mr Cervical Spine Wo Contrast Result Date: 06/21/2018  IMPRESSION: No detectable acute or traumatic finding. Likely chronic kyphotic curvature in the cervical region. Chronic fusion at C2-3. Facet arthropathy at C3-4 worse on the  right with edema that could be symptomatic. Right foraminal stenosis could affect the right C4 nerve. C4-5 spondylosis and facet degeneration. Mild bilateral foraminal narrowing only. C5-6 spondylosis with foraminal narrowing right worse than left that could in particular affect the right C6 nerve. C6-7 spondylosis with foraminal narrowing left worse than right that could in particular affect the left C7 nerve. Electronically Signed   By: Nelson Chimes M.D.   On: 06/21/2018 17:49   Dg Shoulder Left Result Date: 06/21/2018  IMPRESSION: No acute findings. Mild degenerative change at the acromioclavicular joint space. Electronically Signed   By: Franki Cabot M.D.   On: 06/21/2018 10:10

## 2018-06-24 MED ORDER — LACTULOSE 10 GM/15ML PO SOLN
20.0000 g | Freq: Once | ORAL | Status: AC
  Administered 2018-06-24: 20 g via ORAL
  Filled 2018-06-24: qty 30

## 2018-06-24 MED ORDER — POLYETHYLENE GLYCOL 3350 17 G PO PACK
17.0000 g | PACK | Freq: Every day | ORAL | Status: DC
  Administered 2018-06-24 – 2018-06-27 (×3): 17 g via ORAL
  Filled 2018-06-24 (×3): qty 1

## 2018-06-24 MED ORDER — BISACODYL 5 MG PO TBEC
10.0000 mg | DELAYED_RELEASE_TABLET | Freq: Once | ORAL | Status: AC
  Administered 2018-06-24: 10 mg via ORAL
  Filled 2018-06-24: qty 2

## 2018-06-24 NOTE — Progress Notes (Signed)
PCCM Interval Note  I have discussed the case with Drs Brantley Stage with Vascular Surgery. Best plan would be for [name] to have her carotid endarterectomy with Dr Trula Slade as planned on Wednesday. After that she should be able to safely hold plavix. I will work on arranging bronchoscopy with [facility] for next week (> 5 days off plavix).   I will see her 12/10, help coordinate.   Baltazar Apo, MD, PhD 06/24/2018, 4:06 PM [facility] [phone] or if no answer [phone]

## 2018-06-24 NOTE — Progress Notes (Signed)
PROGRESS NOTE    [name]  YDX:[id number] DOB: 1950-09-09 DOA: 06/21/2018 PCP: [name], [name], MD  Brief Narrative:  67 y.o. female with previous history of hypertension off antihypertensives for many years now, tobacco abuse who was just discharged yesterday after being admitted for acute GI bleed EGD showed multiple ulcers in the duodenum and also ulcers in the gastric mucosa and esophagus presents to the ER because of persistent weakness and unable to dorsiflex left hand.  Patient states her symptoms started yesterday after discharge around 3 PM which improved briefly but started again worsening this morning.  Denies any weakness of the other extremities or difficulty speaking swallowing or any visual symptoms.  ED Course: In the ER patient had CT head which was not showing anything acute.  MRI of the brain and C-spine was done which shows multiple infarcts in the watershed area of the right cerebrum and admitted for further work-up for acute stroke.  Assessment & Plan:   Principal Problem:   Acute CVA (cerebrovascular accident) ([place] Active Problems:   Upper GI bleed   Acute blood loss anemia  Acute CVA -right hemispheric watershed possible source proximal right ICA stenosis -Improving arm function, and hand.  -Patient was outside the window for TPA treatment, -Also recent history of GI bleed was not anticoagulated including aspirin Neurologist team,following.    -MRI >>  Numerous small infarctions scattered within the right cerebral hemisphere, probably watershed distribution  CTA .>> Severe near occlusive stenosis at the origin of the right ICA, almost certainly the causative agent for the previously identified right cerebral watershed type infarcts. 2. Severe high-grade stenosis at the origin of the left vertebral artery, with additional multifocal moderate to severe stenoses just distal within the left V1 segment.  - Echo-Left ventricle: The cavity size was  normal. Wall thickness was   increased in a pattern of mild LVH. Systolic function was normal.   The estimated ejection fraction was in the range of 60% to 65%.   Wall motion was normal; there were no regional wall motion   abnormalities. Left ventricular diastolic function parameters   were normal. - Atrial septum: No defect or patent foramen ovale was identified  Allowing permissive hypertension, continue neurochecks, PT/OT Continue statins, avoiding antiplatelet therapy due to recent GI bleed eventually she needs to be on aspirin or Plavix.  At this time neurology anticipating starting the patient on aspirin and PPI once stable  Right ICA stenosis, severe -Vascular surgery has been consulted,  -Contributing to current CVA, -Patient has been seen and evaluated, anticipating CEA  on Wednesday12/11  Recent GI bleed with acute blood loss anemia -Recent hospitalization, H&H stable, follow CBC. Transfuse if less than 7. On Protonix twice daily. - EGD showed multiple ulcers in the duodenum with gastric ulcers and esophageal ulcers.   HTN -previous history of hypertension has not been taking the antihypertensives for many years.  Allow for permissive hypertension per neurology recommendations given that the patient has watershed infarcts.  Tobacco abuse -advised to quit smoking.  Chronic back pain -PRN analgesics, try to avoid NSAIDs due to GI bleed  Approximate 5.5 cm right paratracheal mass, found on CTA   possibly a primary mediastinal lesion. Further evaluation possibly biopsy and further imaging PCCM: Consulted pulmonary for further evaluation and recommendation, possibly biopsy.?  Plan for biopsy by PCCM CT chest without contrast shows -4 cm right lower lobe lung mass most consistent with primary lung neoplasm and associated right hilar and mediastinal metastatic adenopathy.  2. No findings for pulmonary metastatic disease or upper abdominal metastatic disease  and no definite bone lesions. 3. PET-CT and referral to multi-disciplinary thoracic clinic is suggested.  Hyperlipidemia -LDL 108, goal less than 70, currently started on  Lipitor 40 mg daily  Hypokalemia replete K        Estimated body mass index is 37.54 kg/m as calculated from the following:   Height as of this encounter: 5\' 4"  (1.626 m).   Weight as of this encounter: 99.2 kg.  DVT prophylaxis: SCD due to recent GI bleed Code Status: Full code Family Communication: Discussed with patient and husband Disposition Plan: Pending right CEA and lung mass biopsy   Consultants: PCCM, neurology, vascular   Procedures: None Antimicrobials: None  Subjective: Sitting up in chair complains of left upper extremity weakness depressed about the situation but trying to smile anxious to have the procedures done   Objective: Vitals:   06/23/18 2306 06/24/18 0302 06/24/18 0745 06/24/18 1125  BP: (!) 141/87 (!) 163/91 (!) 141/86 (!) 144/82  Pulse: 83 77    Resp: 16 16    Temp: 97.6 F (36.4 C) 98 F (36.7 C) 98.3 F (36.8 C)   TempSrc: Oral Oral Oral   SpO2: 97% 96% 98% 98%  Weight:      Height:        Intake/Output Summary (Last 24 hours) at 06/24/2018 1420 Last data filed at 06/24/2018 0300 Gross per 24 hour  Intake 500 ml  Output -  Net 500 ml   Filed Weights   06/22/18 0049  Weight: 99.2 kg    Examination:  General exam: Appears calm and comfortable  Respiratory system: Clear to auscultation. Respiratory effort normal. Cardiovascular system: S1 & S2 heard, RRR. No JVD, murmurs, rubs, gallops or clicks. No pedal edema. Gastrointestinal system: Abdomen is nondistended, soft and nontender. No organomegaly or masses felt. Normal bowel sounds heard. Central nervous system: Left upper extremity weakness Extremities: Symmetric 5 x 5 power. Skin: No rashes, lesions or ulcers Psychiatry: Judgement and insight appear normal. Mood & affect appropriate.     Data  Reviewed: I have personally reviewed following labs and imaging studies  CBC: Recent Labs  Lab 06/19/18 0836  06/19/18 2333 06/20/18 0628 06/21/18 1024 06/21/18 1213 06/22/18 0004  WBC 9.9  --   --  7.9 9.3 9.3 8.4  NEUTROABS  --   --   --   --   --  7.1  --   HGB 10.1*   < > 8.1* 8.1* 8.5* 8.2* 7.5*  HCT 33.6*   < > 26.7* 26.6* 25.5* 26.3* 24.7*  MCV 107.3*  --   --  108.6* 98.9 105.6* 103.8*  PLT 179  --   --  179  --  211 226   < > = values in this interval not displayed.   Basic Metabolic Panel: Recent Labs  Lab 06/19/18 0836 06/20/18 0628 06/21/18 1213 06/22/18 0004  NA 139 140 141 139  K 4.1 3.8 3.6 3.2*  CL 112* 112* 106 105  CO2 20* 24 26 26   GLUCOSE 149* 125* 106* 102*  BUN 28* 16 11 9   CREATININE 0.74 0.58 0.76 0.88  CALCIUM 7.8* 8.0* 8.8* 8.4*   GFR: Estimated Creatinine Clearance: 72.9 mL/min (by C-G formula based on SCr of 0.88 mg/dL). Liver Function Tests: Recent Labs  Lab 06/19/18 0836 06/21/18 1213 06/22/18 0004  AST 15 18 15   ALT 14 15 13   ALKPHOS 37* 45 46  BILITOT  0.4 0.6 0.5  PROT 4.9* 5.9* 5.1*  ALBUMIN 2.6* 3.4* 2.8*   No results for input(s): LIPASE, AMYLASE in the last 168 hours. Recent Labs  Lab 06/19/18 0849  AMMONIA 16   Coagulation Profile: Recent Labs  Lab 06/19/18 0836 06/21/18 1213  INR 1.01 0.90   Cardiac Enzymes: Recent Labs  Lab 06/21/18 1213  TROPONINI <0.03   BNP (last 3 results) No results for input(s): PROBNP in the last 8760 hours. HbA1C: Recent Labs    06/22/18 0004  HGBA1C 5.1   CBG: Recent Labs  Lab 06/21/18 1328 06/23/18 1119 06/23/18 1638  GLUCAP 92 93 140*   Lipid Profile: Recent Labs    06/22/18 0004  CHOL 163  HDL 34*  LDLCALC 108*  TRIG 104  CHOLHDL 4.8   Thyroid Function Tests: No results for input(s): TSH, T4TOTAL, FREET4, T3FREE, THYROIDAB in the last 72 hours. Anemia Panel: No results for input(s): VITAMINB12, FOLATE, FERRITIN, TIBC, IRON, RETICCTPCT in the last 72  hours. Sepsis Labs: No results for input(s): PROCALCITON, LATICACIDVEN in the last 168 hours.  No results found for this or any previous visit (from the past 240 hour(s)).       Radiology Studies: Ct Chest Wo Contrast  Result Date: 06/23/2018 CLINICAL DATA:  Evaluate chest mass seen on recent neck CT. EXAM: CT CHEST WITHOUT CONTRAST TECHNIQUE: Multidetector CT imaging of the chest was performed following the standard protocol without IV contrast. COMPARISON:  CT neck 06/22/2018 FINDINGS: Examination is quite limited due to motion. Cardiovascular: The heart is normal in size. No pericardial effusion. There is mild tortuosity, ectasia and calcification of the thoracic aorta along with a ductus diverticulum. No focal aneurysm. Dense three-vessel coronary artery calcifications are noted and calcifications noted at the aortic branch vessel ostia. Mediastinum/Nodes: Right hilar and mediastinal lymphadenopathy. 5.4 cm right paratracheal mass/adenopathy compressing and displacing the SVC. Smaller secondary mediastinal lymph nodes are noted more anteriorly. 2 cm subcarinal lymph node on image number 55. The esophagus is grossly normal. Lungs/Pleura: 4 cm right lower lobe lung mass most consistent with primary lung neoplasm. Suspect associated right hilar adenopathy but difficult to measure without IV contrast. No metastatic pulmonary nodules are identified. Emphysematous changes and mild pulmonary scarring. Upper Abdomen: No definite findings for hepatic or adrenal gland metastasis. No upper abdominal adenopathy. Musculoskeletal: No definite lytic or sclerotic bone lesions to suggest metastatic disease. IMPRESSION: 1. 4 cm right lower lobe lung mass most consistent with primary lung neoplasm and associated right hilar and mediastinal metastatic adenopathy. 2. No findings for pulmonary metastatic disease or upper abdominal metastatic disease and no definite bone lesions. 3. PET-CT and referral to  multi-disciplinary thoracic clinic is suggested. Aortic Atherosclerosis (ICD10-I70.0) and Emphysema (ICD10-J43.9). Electronically Signed   By: Marijo Sanes M.D.   On: 06/23/2018 13:59        Scheduled Meds: . atorvastatin  40 mg Oral q1800  . pantoprazole  40 mg Oral BID AC  . polyethylene glycol  17 g Oral Daily   Continuous Infusions:      Georgette Shell, MD [facility]  If 7PM-7AM, please contact night-coverage [email or web address] Password [email or web address] 06/24/2018, 2:20 PM

## 2018-06-24 NOTE — Progress Notes (Signed)
Physical Therapy Treatment Patient Details Name: [name] MRN: [id number] DOB: [birth year] Today's Date: 06/24/2018    History of Present Illness [name] is an 67 y.o. female with PMH: hypertension, hyperlipidemia, obesity, psoriasis, recently discharged from [facility] for GI bleed and presented to PCP same day with L sided weakness that was apparently present on discharge. MRI showed multiple infarcts R cerebral hemisphere.    PT Comments    Patient received up in chair, very pleasant and willing to participate in therapy session today, motivated to practice gait. Able to perform transfers initially with MinA fading to Min guard today, and able to tolerate gait training approximately 134f with SPC and min guard today. Initially required close min guard and Mod cues with SPC, however as technique and steadiness improved this faded to min guard/Min cues. Education provided to family on appropriate cane sizing as her husband is a wDesigner, jewelleryand plans to make her a custom cane when they go home. She was left up in the chair with all needs met, family present, and all questions/concerns addressed this morning.     Follow Up Recommendations  Outpatient PT     Equipment Recommendations  Cane    Recommendations for Other Services       Precautions / Restrictions Precautions Precautions: Fall Precaution Comments: has back pain and R knee pain that affect her balance and function Restrictions Weight Bearing Restrictions: No    Mobility  Bed Mobility               General bed mobility comments: Sitting in recliner upon arrival  Transfers Overall transfer level: Needs assistance Equipment used: Straight cane Transfers: Sit to/from Stand Sit to Stand: Min guard         General transfer comment: Min guard for safety and balance  Ambulation/Gait Ambulation/Gait assistance: Min guard Gait Distance (Feet): 150 Feet Assistive device: Straight cane Gait  Pattern/deviations: Step-through pattern;Wide base of support Gait velocity: decreased   General Gait Details: initially Mod cues for correct use of cane, however faded to Min cues and min guard, patient with correct sequencing and steady gait pattern at end of gait distance    Stairs             Wheelchair Mobility    Modified Rankin (Stroke Patients Only)       Balance Overall balance assessment: Needs assistance Sitting-balance support: No upper extremity supported Sitting balance-Leahy Scale: Good     Standing balance support: Single extremity supported;During functional activity Standing balance-Leahy Scale: Fair Standing balance comment: min guard for safety                            Cognition Arousal/Alertness: Awake/alert Behavior During Therapy: WFL for tasks assessed/performed Overall Cognitive Status: Within Functional Limits for tasks assessed                                 General Comments: much improved processing and reduced time during conversation, generally WNL       Exercises      General Comments        Pertinent Vitals/Pain Pain Assessment: No/denies pain Pain Score: 0-No pain Faces Pain Scale: No hurt Pain Intervention(s): Monitored during session    Home Living     Available Help at Discharge: Family;Available 24 hours/day Type of Home: House  Prior Function            PT Goals (current goals can now be found in the care plan section) Acute Rehab PT Goals Patient Stated Goal: Cook again PT Goal Formulation: With patient Time For Goal Achievement: 07/06/18 Potential to Achieve Goals: Good Progress towards PT goals: Progressing toward goals    Frequency    Min 4X/week      PT Plan Current plan remains appropriate    Co-evaluation              AM-PAC PT "6 Clicks" Mobility   Outcome Measure  Help needed turning from your back to your side while in a flat bed  without using bedrails?: None Help needed moving from lying on your back to sitting on the side of a flat bed without using bedrails?: A Little Help needed moving to and from a bed to a chair (including a wheelchair)?: A Little Help needed standing up from a chair using your arms (e.g., wheelchair or bedside chair)?: A Little Help needed to walk in hospital room?: A Little Help needed climbing 3-5 steps with a railing? : A Little 6 Click Score: 19    End of Session Equipment Utilized During Treatment: Gait belt Activity Tolerance: Patient tolerated treatment well Patient left: in chair;with call bell/phone within reach;with family/visitor present   PT Visit Diagnosis: Unsteadiness on feet (R26.81);Pain Pain - Right/Left: (bilateral ) Pain - part of body: Knee;Shoulder     Time: 1208-1224 PT Time Calculation (min) (ACUTE ONLY): 16 min  Charges:  $Gait Training: 8-22 mins                     Deniece Ree PT, DPT, CBIS  Supplemental Physical Therapist [place]    Pager [phone] Acute Rehab Office [phone]

## 2018-06-24 NOTE — Evaluation (Signed)
Speech Language Pathology Evaluation Patient Details Name: [name] MRN: [id number] DOB: [birth year] Today's Date: 06/24/2018 Time: 0920-1000 SLP Time Calculation (min) (ACUTE ONLY): 40 min  Problem List:  Patient Active Problem List   Diagnosis Date Noted  . Acute CVA (cerebrovascular accident) ([place]) 06/21/2018  . Acute blood loss anemia 06/21/2018  . Upper GI bleed 06/19/2018  . Psoriasis 12/14/2017  . Knee pain, right 12/14/2017  . BMI 32.0-32.9,adult 12/15/2011  . Underinsured 12/15/2011  . HYPERLIPIDEMIA 03/26/2007  . Essential hypertension 03/26/2007   Past Medical History:  Past Medical History:  Diagnosis Date  . Chronic pain   . Hyperlipidemia   . Hypertension    Past Surgical History:  Past Surgical History:  Procedure Laterality Date  . BIOPSY  06/19/2018   Procedure: BIOPSY;  Surgeon: Laurence Spates, MD;  Location: [facility];  Service: Endoscopy;;  . ESOPHAGOGASTRODUODENOSCOPY (EGD) WITH PROPOFOL N/A 06/19/2018   Procedure: ESOPHAGOGASTRODUODENOSCOPY (EGD) WITH PROPOFOL;  Surgeon: Laurence Spates, MD;  Location: [facility] ENDOSCOPY;  Service: Endoscopy;  Laterality: N/A;   HPI:  Heather Stamos Lankfordis an 67 y.o.femaleadmitted 06/21/18 with left side weakness. PMH: hypertension, hyperlipidemia,obesity,psoriasis, recent admission for GI bleeding and hypotension on 12/4. MRI of the brain and C-spine was done which shows multiple infarcts in the watershed area of the right cerebrum    Assessment / Plan / Recommendation Clinical Impression  The Montreal Cognitive Assessment (MoCA) was administered. Pt scored 22/30 (n=26+/30), however, the areas in which she had difficulty were those requiring visuoperception, and pt did not have her reading glasses. Pt appears at baseline, and her husband reports he has noted no changes in cognitive linguistic function. Pt was encouraged to notify PCP if difficulties arise once she returns to her normal routines, as home health or  outpatient therapy may be beneficial. Pt's husband indicated he would be watching out for any changes, but that he has noticed no change in her.     SLP Assessment  SLP Recommendation/Assessment: Patient does not need any further Speech Language Pathology Services at this time   Follow Up Recommendations  (home health or outpatient services if needs arise.)       SLP Evaluation Cognition  Overall Cognitive Status: Within Functional Limits for tasks assessed Arousal/Alertness: Awake/alert Orientation Level: Oriented X4       Comprehension  Auditory Comprehension Overall Auditory Comprehension: Appears within functional limits for tasks assessed    Expression Expression Primary Mode of Expression: Verbal Verbal Expression Overall Verbal Expression: Appears within functional limits for tasks assessed Written Expression Dominant Hand: Right   Oral / Motor  Oral Motor/Sensory Function Overall Oral Motor/Sensory Function: Within functional limits Motor Speech Overall Motor Speech: Appears within functional limits for tasks assessed   GO                   [name] B. [name] [facility], CCC-SLP Speech Language Pathologist [phone]  [name] 06/24/2018, 10:06 AM

## 2018-06-25 LAB — COMPREHENSIVE METABOLIC PANEL
ALBUMIN: 3 g/dL — AB (ref 3.5–5.0)
ALT: 14 U/L (ref 0–44)
AST: 15 U/L (ref 15–41)
Alkaline Phosphatase: 49 U/L (ref 38–126)
Anion gap: 10 (ref 5–15)
BUN: 6 mg/dL — ABNORMAL LOW (ref 8–23)
CO2: 24 mmol/L (ref 22–32)
Calcium: 8.9 mg/dL (ref 8.9–10.3)
Chloride: 106 mmol/L (ref 98–111)
Creatinine, Ser: 0.81 mg/dL (ref 0.44–1.00)
GFR calc Af Amer: >60 mL/min (ref >60)
GFR calc non Af Amer: >60 mL/min (ref >60)
GLUCOSE: 101 mg/dL — AB (ref 70–99)
Potassium: 3.8 mmol/L (ref 3.5–5.1)
Sodium: 140 mmol/L (ref 135–145)
Total Bilirubin: 0.7 mg/dL (ref 0.3–1.2)
Total Protein: 5.8 g/dL — ABNORMAL LOW (ref 6.5–8.1)

## 2018-06-25 LAB — CBC
HCT: 27.3 % — ABNORMAL LOW (ref 36.0–46.0)
Hemoglobin: 8.2 g/dL — ABNORMAL LOW (ref 12.0–15.0)
MCH: 31.1 pg (ref 26.0–34.0)
MCHC: 30 g/dL (ref 30.0–36.0)
MCV: 103.4 fL — ABNORMAL HIGH (ref 80.0–100.0)
Platelets: 349 10*3/uL (ref 150–400)
RBC: 2.64 MIL/uL — ABNORMAL LOW (ref 3.87–5.11)
RDW: 14.6 % (ref 11.5–15.5)
WBC: 7.6 10*3/uL (ref 4.0–10.5)
nRBC: 0 % (ref 0.0–0.2)

## 2018-06-25 LAB — SURGICAL PCR SCREEN
MRSA, PCR: NEGATIVE
Staphylococcus aureus: POSITIVE — AB

## 2018-06-25 LAB — MAGNESIUM: Magnesium: 1.9 mg/dL (ref 1.7–2.4)

## 2018-06-25 MED ORDER — MUPIROCIN 2 % EX OINT: 1.0000 "application " | TOPICAL_OINTMENT | Freq: Two times a day (BID) | CUTANEOUS | Status: DC

## 2018-06-25 MED ORDER — CEFAZOLIN SODIUM-DEXTROSE 2-4 GM/100ML-% IV SOLN
2.0000 g | INTRAVENOUS | Status: AC
  Administered 2018-06-26: 2 g via INTRAVENOUS
  Filled 2018-06-25: qty 100

## 2018-06-25 MED ORDER — POTASSIUM CHLORIDE CRYS ER 20 MEQ PO TBCR
40.0000 meq | EXTENDED_RELEASE_TABLET | Freq: Once | ORAL | Status: AC
  Administered 2018-06-25: 40 meq via ORAL
  Filled 2018-06-25: qty 2

## 2018-06-25 MED ORDER — MAGNESIUM SULFATE 4 GM/100ML IV SOLN
4.0000 g | Freq: Once | INTRAVENOUS | Status: AC
  Administered 2018-06-25: 4 g via INTRAVENOUS
  Filled 2018-06-25: qty 100

## 2018-06-25 MED ORDER — MUPIROCIN 2 % EX OINT
1.0000 "application " | TOPICAL_OINTMENT | Freq: Two times a day (BID) | CUTANEOUS | Status: DC
  Administered 2018-06-25 – 2018-06-27 (×3): 1 via NASAL
  Filled 2018-06-25 (×3): qty 22

## 2018-06-25 MED ORDER — BISACODYL 10 MG RE SUPP
10.0000 mg | Freq: Once | RECTAL | Status: AC
  Administered 2018-06-25: 10 mg via RECTAL
  Filled 2018-06-25: qty 1

## 2018-06-25 NOTE — Progress Notes (Signed)
Physical Therapy Treatment Patient Details Name: [name] MRN: [id number] DOB: 1951/05/03 Today's Date: 06/25/2018    History of Present Illness [name] is an 67 y.o. female with PMH: hypertension, hyperlipidemia, obesity, psoriasis, recently discharged from [facility] for GI bleed and presented to PCP same day with L sided weakness that was apparently present on discharge. MRI showed multiple infarcts R cerebral hemisphere.    PT Comments    Patient's tolerance to treatment today was good.  Patient was sitting in recliner chair with visitor present upon PT arrival.  Patient continues to require mod VC, tactile cueing, and demonstration for correct use and sequencing of cane during ambulation.  Patient's gait appeared mildly unsteady due to poor sequencing with cane requiring min guard for safety awareness. I have discussed the patient's current level of function related to mobility with the patient.  She acknowledges understanding of this and feels she can provide the level of care she will need at home.  Patient remains an appropriate candidate for outpatient PT at this time based on current functional status.      Follow Up Recommendations  Outpatient PT     Equipment Recommendations  Cane    Recommendations for Other Services       Precautions / Restrictions Precautions Precautions: Fall Precaution Comments: has back pain and R knee pain that affect her balance and function Restrictions Weight Bearing Restrictions: No    Mobility  Bed Mobility               General bed mobility comments: Sitting in recliner upon arrival  Transfers Overall transfer level: Needs assistance Equipment used: Straight cane Transfers: Sit to/from Stand Sit to Stand: Min guard         General transfer comment: Min guard for safety.  Ambulation/Gait Ambulation/Gait assistance: Min guard Gait Distance (Feet): 160 Feet Assistive device: Straight cane Gait  Pattern/deviations: Step-through pattern Gait velocity: decreased   General Gait Details: pt required mod VC and demonstration for coordination and correct use of cane.  Pt was inconsistent with demonstrating correct sequencing and steady gait pattern during gait trial today.   Stairs             Wheelchair Mobility    Modified Rankin (Stroke Patients Only) Modified Rankin (Stroke Patients Only) Pre-Morbid Rankin Score: No symptoms Modified Rankin: Moderately severe disability     Balance Overall balance assessment: Needs assistance Sitting-balance support: No upper extremity supported Sitting balance-Leahy Scale: Good     Standing balance support: Single extremity supported;During functional activity Standing balance-Leahy Scale: Fair Standing balance comment: min guard for safety and use of cane                            Cognition Arousal/Alertness: Awake/alert Behavior During Therapy: WFL for tasks assessed/performed Overall Cognitive Status: Within Functional Limits for tasks assessed                                        Exercises      General Comments        Pertinent Vitals/Pain Pain Assessment: No/denies pain    Home Living                      Prior Function            PT Goals (current  goals can now be found in the care plan section) Acute Rehab PT Goals Patient Stated Goal: none stated  PT Goal Formulation: With patient Time For Goal Achievement: 07/06/18 Potential to Achieve Goals: Good Progress towards PT goals: Progressing toward goals    Frequency    Min 4X/week      PT Plan Current plan remains appropriate    Co-evaluation              AM-PAC PT "6 Clicks" Mobility   Outcome Measure  Help needed turning from your back to your side while in a flat bed without using bedrails?: None Help needed moving from lying on your back to sitting on the side of a flat bed without using  bedrails?: A Little Help needed moving to and from a bed to a chair (including a wheelchair)?: A Little Help needed standing up from a chair using your arms (e.g., wheelchair or bedside chair)?: A Little Help needed to walk in hospital room?: A Little Help needed climbing 3-5 steps with a railing? : A Little 6 Click Score: 19    End of Session Equipment Utilized During Treatment: Gait belt Activity Tolerance: Patient tolerated treatment well Patient left: in chair;with call bell/phone within reach;with family/visitor present Nurse Communication: Mobility status PT Visit Diagnosis: Unsteadiness on feet (R26.81);Pain     Time: 0177-9390 PT Time Calculation (min) (ACUTE ONLY): 22 min  Charges:  $Gait Training: 8-22 mins                     [address] SPTA    Acelyn Basham 06/25/2018, 1:23 PM

## 2018-06-25 NOTE — H&P (View-Only) (Signed)
    Subjective  -   Excited about improvement in left ahnd   Physical Exam:  Improved strength in left hand RRR CTAB    Assessment/Plan:    Plan for right CEA tomorrow. Will discuss with Dr. Lamonte Sakai regarding biopsy of mediastinal mass following recovery from CEA  Wells Brabham 06/25/2018 8:55 PM --  Vitals:   06/25/18 1558 06/25/18 2015  BP: 132/76 (!) 152/99  Pulse: 91 84  Resp:  (!) 21  Temp: 98.7 F (37.1 C) 98.7 F (37.1 C)  SpO2: 97% 100%   No intake or output data in the 24 hours ending 06/25/18 2055   Laboratory CBC    Component Value Date/Time   WBC 7.6 06/25/2018 1107   HGB 8.2 (L) 06/25/2018 1107   HCT 27.3 (L) 06/25/2018 1107   PLT 349 06/25/2018 1107    BMET    Component Value Date/Time   NA 140 06/25/2018 1107   K 3.8 06/25/2018 1107   CL 106 06/25/2018 1107   CO2 24 06/25/2018 1107   GLUCOSE 101 (H) 06/25/2018 1107   BUN 6 (L) 06/25/2018 1107   CREATININE 0.81 06/25/2018 1107   CALCIUM 8.9 06/25/2018 1107   GFRNONAA >60 06/25/2018 1107   GFRAA >60 06/25/2018 1107    COAG Lab Results  Component Value Date   INR 0.90 06/21/2018   INR 1.01 06/19/2018   No results found for: PTT  Antibiotics Anti-infectives (From admission, onward)   Start     Dose/Rate Route Frequency Ordered Stop   06/26/18 0600  ceFAZolin (ANCEF) IVPB 2g/100 mL premix     2 g 200 mL/hr over 30 Minutes Intravenous To Short Stay 06/25/18 0749 06/27/18 0600       V. Leia Alf, M.D. [facility] Office: [phone] Pager:  [phone]

## 2018-06-25 NOTE — Anesthesia Preprocedure Evaluation (Addendum)
Anesthesia Evaluation  Patient identified by MRN, date of birth, ID band Patient awake    Reviewed: Allergy & Precautions, NPO status , Patient's Chart, lab work & pertinent test results  Airway Mallampati: II  TM Distance: >3 FB Neck ROM: Full    Dental  (+) Dental Advisory Given, Lower Dentures, Upper Dentures   Pulmonary Current Smoker,  Probable lung cancer   Pulmonary exam normal breath sounds clear to auscultation       Cardiovascular hypertension, Pt. on medications + Past MI and + Peripheral Vascular Disease  Normal cardiovascular exam Rhythm:Regular Rate:Normal  Echo 23/7/19: -Left ventricle: The cavity size was normal. Wall thickness was increased in a pattern of mild LVH. Systolic function was normal. The estimated ejection fraction was in the range of 60% to 65%. Wall motion was normal; there were no regional wall motion abnormalities. Left ventricular diastolic function parameters were normal. - Atrial septum: No defect or patent foramen ovale was identified   Neuro/Psych CVA, Residual Symptoms    GI/Hepatic Neg liver ROS, GERD  Medicated,  Endo/Other  Obesity   Renal/GU negative Renal ROS     Musculoskeletal negative musculoskeletal ROS (+)   Abdominal   Peds  Hematology  (+) Blood dyscrasia, anemia ,   Anesthesia Other Findings Day of surgery medications reviewed with the patient.  Reproductive/Obstetrics                            Anesthesia Physical Anesthesia Plan  ASA: III  Anesthesia Plan: General   Post-op Pain Management:    Induction: Intravenous  PONV Risk Score and Plan: 2 and Midazolam, Dexamethasone and Ondansetron  Airway Management Planned: Oral ETT  Additional Equipment: Arterial line  Intra-op Plan:   Post-operative Plan: Extubation in OR  Informed Consent: I have reviewed the patients History and Physical, chart, labs and  discussed the procedure including the risks, benefits and alternatives for the proposed anesthesia with the patient or authorized representative who has indicated his/her understanding and acceptance.   Dental advisory given  Plan Discussed with: CRNA  Anesthesia Plan Comments: (2nd IV after induction.)       Anesthesia Quick Evaluation

## 2018-06-25 NOTE — Progress Notes (Signed)
Occupational Therapy Treatment Patient Details Name: [name] MRN: [id number] DOB: Mar 20, 1951 Today's Date: 06/25/2018    History of present illness [name] is an 67 y.o. female with PMH: hypertension, hyperlipidemia, obesity, psoriasis, recently discharged from [facility] for GI bleed and presented to PCP same day with L sided weakness that was apparently present on discharge. MRI showed multiple infarcts R cerebral hemisphere.   OT comments  Patient seated in recliner.  Eager to participate in OT session.  Noted improvements in L UE functional ROM and strength, continues to be limited by coordination and compensatory movements at shoulder and elbow.  Reviewed theraputty exercises, limiting shoulder elevation and educated on IADL activities for increased functional use (washing non breakable dishes). Cueing required for task attention.  Good use of L UE functionally with grooming task at sink, supervision level.  Will continue to follow.  DC plan remains appropriate.       Follow Up Recommendations  Outpatient OT;Supervision/Assistance - 24 hour    Equipment Recommendations  None recommended by OT    Recommendations for Other Services PT consult    Precautions / Restrictions Precautions Precautions: Fall Precaution Comments: has back pain and R knee pain that affect her balance and function Restrictions Weight Bearing Restrictions: No       Mobility Bed Mobility               General bed mobility comments: Sitting in recliner upon arrival  Transfers Overall transfer level: Needs assistance Equipment used: None Transfers: Sit to/from Stand Sit to Stand: Supervision;Min guard         General transfer comment: min guard to close supervision for safety     Balance Overall balance assessment: Needs assistance Sitting-balance support: No upper extremity supported Sitting balance-Leahy Scale: Good     Standing balance support: During functional  activity;No upper extremity supported Standing balance-Leahy Scale: Fair Standing balance comment: min guard for safety and use of cane(preference to UE support with mobility)                           ADL either performed or assessed with clinical judgement   ADL Overall ADL's : Needs assistance/impaired     Grooming: Supervision/safety;Wash/dry hands;Standing Grooming Details (indicate cue type and reason): supervision for grooming (hand washing) at sink with good recall using L UE to turn on/off water                  Toilet Transfer: Supervision/safety;Ambulation Toilet Transfer Details (indicate cue type and reason): simulated to recliner                  Vision       Perception     Praxis      Cognition Arousal/Alertness: Awake/alert Behavior During Therapy: WFL for tasks assessed/performed Overall Cognitive Status: Within Functional Limits for tasks assessed                                 General Comments: WFL, easily distracted and requires cueing to attend to tasks         Exercises Other Exercises Other Exercises: reviewed theraputty exercises and completed: rolling, flattening, pinch and making peas  Other Exercises: Grasp and release tasks with small theraputty peas with focus on wrist extension and neutral position  Other Exercises: reviewed FM coordination exercises: opposition, finger isolation Other Exercises: Reviewed IADL  tasks and safety   Shoulder Instructions       General Comments      Pertinent Vitals/ Pain       Pain Assessment: No/denies pain  Home Living                                          Prior Functioning/Environment              Frequency  Min 3X/week        Progress Toward Goals  OT Goals(current goals can now be found in the care plan section)  Progress towards OT goals: Progressing toward goals  Acute Rehab OT Goals Patient Stated Goal: to use my L hand  normally OT Goal Formulation: With patient Time For Goal Achievement: 07/06/18 Potential to Achieve Goals: Good ADL Goals Pt Will Perform Grooming: with modified independence;standing Pt Will Perform Upper Body Dressing: with modified independence;sitting Pt Will Perform Tub/Shower Transfer: with modified independence;ambulating;shower seat;Tub transfer Pt/caregiver will Perform Home Exercise Program: Increased ROM;Increased strength;Left upper extremity;With theraputty;Independently;With written HEP provided Additional ADL Goal #1: Pt will incorporate LUE into ADL tasks 75% of time  Plan Discharge plan remains appropriate;Frequency remains appropriate    Co-evaluation                 AM-PAC OT "6 Clicks" Daily Activity     Outcome Measure   Help from another person eating meals?: None Help from another person taking care of personal grooming?: A Little Help from another person toileting, which includes using toliet, bedpan, or urinal?: A Little Help from another person bathing (including washing, rinsing, drying)?: A Little Help from another person to put on and taking off regular upper body clothing?: A Little Help from another person to put on and taking off regular lower body clothing?: A Little 6 Click Score: 19    End of Session    OT Visit Diagnosis: Unsteadiness on feet (R26.81);Other abnormalities of gait and mobility (R26.89);Muscle weakness (generalized) (M62.81);Hemiplegia and hemiparesis;Pain Hemiplegia - Right/Left: Left Hemiplegia - dominant/non-dominant: Non-Dominant Hemiplegia - caused by: Cerebral infarction   Activity Tolerance Patient tolerated treatment well   Patient Left in chair;with call bell/phone within reach;with family/visitor present   Nurse Communication Mobility status        Time: 8413-2440 OT Time Calculation (min): 22 min  Charges: OT General Charges $OT Visit: 1 Visit OT Treatments $Self Care/Home Management : 8-22  mins  Delight Stare, [facility] Pager [phone] Office [phone]    Delight Stare 06/25/2018, 5:00 PM

## 2018-06-25 NOTE — Telephone Encounter (Signed)
FYI

## 2018-06-25 NOTE — Progress Notes (Signed)
    Subjective  -   Excited about improvement in left ahnd   Physical Exam:  Improved strength in left hand RRR CTAB    Assessment/Plan:    Plan for right CEA tomorrow. Will discuss with Dr. Lamonte Sakai regarding biopsy of mediastinal mass following recovery from CEA  [name] 06/25/2018 8:55 PM --  Vitals:   06/25/18 1558 06/25/18 2015  BP: 132/76 (!) 152/99  Pulse: 91 84  Resp:  (!) 21  Temp: 98.7 F (37.1 C) 98.7 F (37.1 C)  SpO2: 97% 100%   No intake or output data in the 24 hours ending 06/25/18 2055   Laboratory CBC    Component Value Date/Time   WBC 7.6 06/25/2018 1107   HGB 8.2 (L) 06/25/2018 1107   HCT 27.3 (L) 06/25/2018 1107   PLT 349 06/25/2018 1107    BMET    Component Value Date/Time   NA 140 06/25/2018 1107   K 3.8 06/25/2018 1107   CL 106 06/25/2018 1107   CO2 24 06/25/2018 1107   GLUCOSE 101 (H) 06/25/2018 1107   BUN 6 (L) 06/25/2018 1107   CREATININE 0.81 06/25/2018 1107   CALCIUM 8.9 06/25/2018 1107   GFRNONAA >60 06/25/2018 1107   GFRAA >60 06/25/2018 1107    COAG Lab Results  Component Value Date   INR 0.90 06/21/2018   INR 1.01 06/19/2018   No results found for: PTT  Antibiotics Anti-infectives (From admission, onward)   Start     Dose/Rate Route Frequency Ordered Stop   06/26/18 0600  ceFAZolin (ANCEF) IVPB 2g/100 mL premix     2 g 200 mL/hr over 30 Minutes Intravenous To Short Stay 06/25/18 0749 06/27/18 0600       V. Leia Alf, M.D. [facility] [place] Office: [phone] Pager:  [phone]

## 2018-06-25 NOTE — Care Management Important Message (Signed)
Important Message  Patient Details  Name: [name] MRN: [id number] Date of Birth: 1951/04/05   Medicare Important Message Given:  Yes    Orbie Pyo 06/25/2018, 3:01 PM

## 2018-06-25 NOTE — Progress Notes (Signed)
PROGRESS NOTE    [name]  ION:[id number] DOB: 1951/01/13 DOA: 06/21/2018 PCP: [name], [name], MD  Brief Narrative:67 y.o.femalewithprevious history of hypertension off antihypertensives for many years now, tobacco abuse who was just discharged yesterday after being admitted for acute GI bleed EGD showed multiple ulcers in the duodenum and also ulcers in the gastric mucosa and esophagus presents to the ER because of persistent weakness and unable to dorsiflex left hand. Patient states her symptoms started yesterday after discharge around 3 PM which improved briefly but started again worsening this morning. Denies any weakness of the other extremities or difficulty speaking swallowing or any visual symptoms.  ED Course:In the ER patient had CT head which was not showing anything acute. MRI of the brain and C-spine was done which shows multiple infarcts in the watershed area of the right cerebrum and admitted for further work-up for acute stroke. Assessment & Plan:   Principal Problem:   Acute CVA (cerebrovascular accident) ([place]) Active Problems:   Upper GI bleed   Acute blood loss anemia  Acute CVA -right hemispheric watershed possible source proximal right ICA stenosis -Improving arm function,and hand.  -Patient was outside the window for TPA treatment, -Also recent history of GI bleed was not anticoagulated including aspirin Neurologist team,following.    -MRI>>Numerous small infarctions scattered within the right cerebral hemisphere, probably watershed distribution  CTA.>>Severe near occlusive stenosis at the origin of the right ICA, almost certainly the causative agent for the previously identified right cerebral watershed type infarcts. 2. Severe high-grade stenosis at the origin of the left vertebral artery, with additional multifocal moderate to severe stenoses just distal within the left V1 segment.  - Echo-Left ventricle: The cavity size was normal.  Wall thickness was increased in a pattern of mild LVH. Systolic function was normal. The estimated ejection fraction was in the range of 60% to 65%. Wall motion was normal; there were no regional wall motion abnormalities. Left ventricular diastolic function parameters were normal. - Atrial septum: No defect or patent foramen ovale was identified    Allowing permissive hypertension, continue neurochecks, PT/OT Continue statins, avoiding antiplatelet therapy due to recent GI bleed eventually she needs to be on aspirin or Plavix.  At this time neurology anticipating starting the patient on aspirin and PPI once stable  Right ICA stenosis, severe -Vascular surgery has been consulted,  -Contributing to current CVA, -Patient has been seen and evaluated, anticipating CEA  on Wednesday12/11  Recent GI bleedwith acute blood loss anemia -Recent hospitalization, H&H stable, follow CBC. Transfuse if less than 7. On Protonix twice daily. - EGD showed multiple ulcers in the duodenum with gastric ulcers and esophageal ulcers.   HTN -previous history of hypertension has not been taking the antihypertensives for many years.  Allow for permissive hypertensionper neurology recommendationsgiven that the patient has watershed infarcts.  Tobacco abuse -advised to quit smoking.  Chronic back pain -PRN analgesics, try to avoid NSAIDs due to GI bleed  Approximate 5.5 cm right paratracheal mass, found on CTA  possibly a primary mediastinal lesion. Further evaluation possibly biopsy and further imaging PCCM: Consulted pulmonary for further evaluation andrecommendation, possibly biopsy.?  Plan for biopsy by PCCM CT chest without contrast shows -4 cm right lower lobe lung mass most consistent with primary lung neoplasm and associated right hilar and mediastinal metastatic adenopathy. 2. No findings for pulmonary metastatic disease or upper abdominal metastatic disease and no  definite bone lesions. 3. PET-CT and referral to multi-disciplinary thoracic clinic is suggested.  Hyperlipidemia -LDL 108, goal less than 70, currently started on Lipitor 40 mg daily  Hypokalemia replete K  Constipation no bowel movement overnight with Dulcolax and MiraLAX we will give her a suppository today.   Nutrition Interventions:     Estimated body mass index is 37.54 kg/m as calculated from the following:   Height as of this encounter: 5\' 4"  (1.626 m).   Weight as of this encounter: 99.2 kg.  DVT prophylaxis: SCD Code Status: Full code Family Communication: Discussed with husband in the room Disposition Plan: Patient pending left carotid endarterectomy tomorrow and then possible discharge holding Plavix for the following 5 days for bronc and biopsy.  Consultants: PCCM and vascular and neuro  Procedures: None Antimicrobials: None  Subjective: Patient anxious to have the procedure done.  Complains of constipation  Objective: Vitals:   06/24/18 1959 06/25/18 0019 06/25/18 0404 06/25/18 0805  BP: (!) 128/100 (!) 146/90 (!) 144/92 131/70  Pulse: 85 84 78 78  Resp: 20 18 18 18   Temp: 98.4 F (36.9 C) 98 F (36.7 C) 97.7 F (36.5 C) 98.8 F (37.1 C)  TempSrc: Oral Oral Oral Oral  SpO2: 98% 96% 98% 98%  Weight:      Height:       No intake or output data in the 24 hours ending 06/25/18 1057 Filed Weights   06/22/18 0049  Weight: 99.2 kg    Examination:  General exam: Appears calm and comfortable  Respiratory system: Clear to auscultation. Respiratory effort normal. Cardiovascular system: S1 & S2 heard, RRR. No JVD, murmurs, rubs, gallops or clicks. No pedal edema. Gastrointestinal system: Abdomen is nondistended, soft and nontender. No organomegaly or masses felt. Normal bowel sounds heard. Central nervous system: Left upper extremity weakness Extremities: Symmetric 5 x 5 power. Skin: No rashes, lesions or ulcers Psychiatry: Judgement and insight  appear normal. Mood & affect appropriate.     Data Reviewed: I have personally reviewed following labs and imaging studies  CBC: Recent Labs  Lab 06/19/18 0836  06/19/18 2333 06/20/18 0628 06/21/18 1024 06/21/18 1213 06/22/18 0004  WBC 9.9  --   --  7.9 9.3 9.3 8.4  NEUTROABS  --   --   --   --   --  7.1  --   HGB 10.1*   < > 8.1* 8.1* 8.5* 8.2* 7.5*  HCT 33.6*   < > 26.7* 26.6* 25.5* 26.3* 24.7*  MCV 107.3*  --   --  108.6* 98.9 105.6* 103.8*  PLT 179  --   --  179  --  211 226   < > = values in this interval not displayed.   Basic Metabolic Panel: Recent Labs  Lab 06/19/18 0836 06/20/18 0628 06/21/18 1213 06/22/18 0004  NA 139 140 141 139  K 4.1 3.8 3.6 3.2*  CL 112* 112* 106 105  CO2 20* 24 26 26   GLUCOSE 149* 125* 106* 102*  BUN 28* 16 11 9   CREATININE 0.74 0.58 0.76 0.88  CALCIUM 7.8* 8.0* 8.8* 8.4*   GFR: Estimated Creatinine Clearance: 72.9 mL/min (by C-G formula based on SCr of 0.88 mg/dL). Liver Function Tests: Recent Labs  Lab 06/19/18 0836 06/21/18 1213 06/22/18 0004  AST 15 18 15   ALT 14 15 13   ALKPHOS 37* 45 46  BILITOT 0.4 0.6 0.5  PROT 4.9* 5.9* 5.1*  ALBUMIN 2.6* 3.4* 2.8*   No results for input(s): LIPASE, AMYLASE in the last 168 hours. Recent Labs  Lab 06/19/18 0849  AMMONIA  16   Coagulation Profile: Recent Labs  Lab 06/19/18 0836 06/21/18 1213  INR 1.01 0.90   Cardiac Enzymes: Recent Labs  Lab 06/21/18 1213  TROPONINI <0.03   BNP (last 3 results) No results for input(s): PROBNP in the last 8760 hours. HbA1C: No results for input(s): HGBA1C in the last 72 hours. CBG: Recent Labs  Lab 06/21/18 1328 06/23/18 1119 06/23/18 1638  GLUCAP 92 93 140*   Lipid Profile: No results for input(s): CHOL, HDL, LDLCALC, TRIG, CHOLHDL, LDLDIRECT in the last 72 hours. Thyroid Function Tests: No results for input(s): TSH, T4TOTAL, FREET4, T3FREE, THYROIDAB in the last 72 hours. Anemia Panel: No results for input(s): VITAMINB12,  FOLATE, FERRITIN, TIBC, IRON, RETICCTPCT in the last 72 hours. Sepsis Labs: No results for input(s): PROCALCITON, LATICACIDVEN in the last 168 hours.  No results found for this or any previous visit (from the past 240 hour(s)).       Radiology Studies: Ct Chest Wo Contrast  Result Date: 06/23/2018 CLINICAL DATA:  Evaluate chest mass seen on recent neck CT. EXAM: CT CHEST WITHOUT CONTRAST TECHNIQUE: Multidetector CT imaging of the chest was performed following the standard protocol without IV contrast. COMPARISON:  CT neck 06/22/2018 FINDINGS: Examination is quite limited due to motion. Cardiovascular: The heart is normal in size. No pericardial effusion. There is mild tortuosity, ectasia and calcification of the thoracic aorta along with a ductus diverticulum. No focal aneurysm. Dense three-vessel coronary artery calcifications are noted and calcifications noted at the aortic branch vessel ostia. Mediastinum/Nodes: Right hilar and mediastinal lymphadenopathy. 5.4 cm right paratracheal mass/adenopathy compressing and displacing the SVC. Smaller secondary mediastinal lymph nodes are noted more anteriorly. 2 cm subcarinal lymph node on image number 55. The esophagus is grossly normal. Lungs/Pleura: 4 cm right lower lobe lung mass most consistent with primary lung neoplasm. Suspect associated right hilar adenopathy but difficult to measure without IV contrast. No metastatic pulmonary nodules are identified. Emphysematous changes and mild pulmonary scarring. Upper Abdomen: No definite findings for hepatic or adrenal gland metastasis. No upper abdominal adenopathy. Musculoskeletal: No definite lytic or sclerotic bone lesions to suggest metastatic disease. IMPRESSION: 1. 4 cm right lower lobe lung mass most consistent with primary lung neoplasm and associated right hilar and mediastinal metastatic adenopathy. 2. No findings for pulmonary metastatic disease or upper abdominal metastatic disease and no definite  bone lesions. 3. PET-CT and referral to multi-disciplinary thoracic clinic is suggested. Aortic Atherosclerosis (ICD10-I70.0) and Emphysema (ICD10-J43.9). Electronically Signed   By: Marijo Sanes M.D.   On: 06/23/2018 13:59        Scheduled Meds: . atorvastatin  40 mg Oral q1800  . pantoprazole  40 mg Oral BID AC  . polyethylene glycol  17 g Oral Daily   Continuous Infusions: . [START ON 06/26/2018]  ceFAZolin (ANCEF) IV       LOS: 3 days     Georgette Shell, MD [facility]  If 7PM-7AM, please contact night-coverage [email or web address] Password [email or web address] 06/25/2018, 10:57 AM

## 2018-06-26 ENCOUNTER — Encounter (HOSPITAL_COMMUNITY)
Admission: EM | Disposition: A | Discharge status: Home / Self Care | Payer: Self-pay | Source: Home / Self Care | Attending: Internal Medicine

## 2018-06-26 ENCOUNTER — Telehealth: Payer: Self-pay | Admitting: Surgery

## 2018-06-26 ENCOUNTER — Inpatient Hospital Stay (HOSPITAL_COMMUNITY): Payer: Medicare Other | Admitting: Certified Registered"

## 2018-06-26 DIAGNOSIS — R918 Other nonspecific abnormal finding of lung field: Secondary | ICD-10-CM

## 2018-06-26 HISTORY — PX: PATCH ANGIOPLASTY: SHX6230

## 2018-06-26 HISTORY — PX: ENDARTERECTOMY: SHX5162

## 2018-06-26 LAB — BASIC METABOLIC PANEL
Anion gap: 8 (ref 5–15)
BUN: 6 mg/dL — ABNORMAL LOW (ref 8–23)
CHLORIDE: 106 mmol/L (ref 98–111)
CO2: 27 mmol/L (ref 22–32)
Calcium: 8.6 mg/dL — ABNORMAL LOW (ref 8.9–10.3)
Creatinine, Ser: 0.93 mg/dL (ref 0.44–1.00)
GFR calc Af Amer: >60 mL/min (ref >60)
GFR calc non Af Amer: >60 mL/min (ref >60)
GLUCOSE: 106 mg/dL — AB (ref 70–99)
Potassium: 4 mmol/L (ref 3.5–5.1)
Sodium: 141 mmol/L (ref 135–145)

## 2018-06-26 LAB — CBC
HCT: 28.6 % — ABNORMAL LOW (ref 36.0–46.0)
Hemoglobin: 8.6 g/dL — ABNORMAL LOW (ref 12.0–15.0)
MCH: 31.3 pg (ref 26.0–34.0)
MCHC: 30.1 g/dL (ref 30.0–36.0)
MCV: 104 fL — ABNORMAL HIGH (ref 80.0–100.0)
Platelets: 364 10*3/uL (ref 150–400)
RBC: 2.75 MIL/uL — ABNORMAL LOW (ref 3.87–5.11)
RDW: 14.6 % (ref 11.5–15.5)
WBC: 6.8 10*3/uL (ref 4.0–10.5)
nRBC: 0 % (ref 0.0–0.2)

## 2018-06-26 LAB — POCT ACTIVATED CLOTTING TIME: Activated Clotting Time: 208 seconds

## 2018-06-26 LAB — PROTIME-INR
INR: 1.01
Prothrombin Time: 13.3 seconds (ref 11.4–15.2)

## 2018-06-26 SURGERY — ENDARTERECTOMY, CAROTID
Anesthesia: General | Site: Neck | Laterality: Right

## 2018-06-26 MED ORDER — HEPARIN SODIUM (PORCINE) 1000 UNIT/ML IJ SOLN
INTRAMUSCULAR | Status: DC | PRN
  Administered 2018-06-26: 10000 [IU] via INTRAVENOUS
  Administered 2018-06-26: 2000 [IU] via INTRAVENOUS

## 2018-06-26 MED ORDER — MORPHINE SULFATE (PF) 2 MG/ML IV SOLN: 0.5000 mg | INTRAVENOUS | Status: DC | PRN

## 2018-06-26 MED ORDER — LACTATED RINGERS IV SOLN
INTRAVENOUS | Status: DC | PRN
  Administered 2018-06-26: 08:00:00 via INTRAVENOUS

## 2018-06-26 MED ORDER — HEPARIN SODIUM (PORCINE) 1000 UNIT/ML IJ SOLN
INTRAMUSCULAR | Status: AC
  Filled 2018-06-26: qty 1

## 2018-06-26 MED ORDER — CEFAZOLIN SODIUM-DEXTROSE 2-4 GM/100ML-% IV SOLN
2.0000 g | Freq: Three times a day (TID) | INTRAVENOUS | Status: AC
  Administered 2018-06-26 (×2): 2 g via INTRAVENOUS
  Filled 2018-06-26 (×2): qty 100

## 2018-06-26 MED ORDER — CHLORHEXIDINE GLUCONATE CLOTH 2 % EX PADS: 6.0000 | MEDICATED_PAD | Freq: Every day | CUTANEOUS | Status: DC

## 2018-06-26 MED ORDER — LABETALOL HCL 5 MG/ML IV SOLN: 10.0000 mg | INTRAVENOUS | Status: DC | PRN

## 2018-06-26 MED ORDER — ONDANSETRON HCL 4 MG/2ML IJ SOLN: 4.0000 mg | Freq: Four times a day (QID) | INTRAMUSCULAR | Status: DC | PRN

## 2018-06-26 MED ORDER — PROPOFOL 10 MG/ML IV BOLUS
INTRAVENOUS | Status: AC
  Filled 2018-06-26: qty 40

## 2018-06-26 MED ORDER — PHENYLEPHRINE 40 MCG/ML (10ML) SYRINGE FOR IV PUSH (FOR BLOOD PRESSURE SUPPORT)
PREFILLED_SYRINGE | INTRAVENOUS | Status: DC | PRN
  Administered 2018-06-26: 40 ug via INTRAVENOUS

## 2018-06-26 MED ORDER — PROPOFOL 10 MG/ML IV BOLUS
INTRAVENOUS | Status: DC | PRN
  Administered 2018-06-26: 50 mg via INTRAVENOUS
  Administered 2018-06-26: 150 mg via INTRAVENOUS

## 2018-06-26 MED ORDER — FENTANYL CITRATE (PF) 100 MCG/2ML IJ SOLN
INTRAMUSCULAR | Status: AC
  Administered 2018-06-26: 16:00:00
  Filled 2018-06-26: qty 2

## 2018-06-26 MED ORDER — MAGNESIUM SULFATE 2 GM/50ML IV SOLN: 2.0000 g | Freq: Every day | INTRAVENOUS | Status: DC | PRN

## 2018-06-26 MED ORDER — OXYCODONE HCL 5 MG PO TABS
5.0000 mg | ORAL_TABLET | ORAL | Status: DC | PRN
  Administered 2018-06-26: 5 mg via ORAL
  Filled 2018-06-26: qty 1

## 2018-06-26 MED ORDER — SODIUM CHLORIDE 0.9 % IV SOLN
INTRAVENOUS | Status: AC
  Filled 2018-06-26: qty 1.2

## 2018-06-26 MED ORDER — LIDOCAINE HCL 1 % IJ SOLN
INTRAMUSCULAR | Status: AC
  Filled 2018-06-26: qty 20

## 2018-06-26 MED ORDER — SODIUM CHLORIDE 0.9 % IV SOLN
INTRAVENOUS | Status: DC | PRN
  Administered 2018-06-26: 10:00:00

## 2018-06-26 MED ORDER — HEMOSTATIC AGENTS (NO CHARGE) OPTIME
TOPICAL | Status: DC | PRN
  Administered 2018-06-26: 1 via TOPICAL

## 2018-06-26 MED ORDER — SUGAMMADEX SODIUM 200 MG/2ML IV SOLN
INTRAVENOUS | Status: DC | PRN
  Administered 2018-06-26: 200 mg via INTRAVENOUS

## 2018-06-26 MED ORDER — HYDRALAZINE HCL 20 MG/ML IJ SOLN: 5.0000 mg | INTRAMUSCULAR | Status: DC | PRN

## 2018-06-26 MED ORDER — PROTAMINE SULFATE 10 MG/ML IV SOLN
INTRAVENOUS | Status: AC
  Filled 2018-06-26: qty 5

## 2018-06-26 MED ORDER — 0.9 % SODIUM CHLORIDE (POUR BTL) OPTIME
TOPICAL | Status: DC | PRN
  Administered 2018-06-26: 3000 mL

## 2018-06-26 MED ORDER — METOPROLOL TARTRATE 5 MG/5ML IV SOLN
INTRAVENOUS | Status: AC
  Filled 2018-06-26: qty 5

## 2018-06-26 MED ORDER — ROCURONIUM BROMIDE 10 MG/ML (PF) SYRINGE
PREFILLED_SYRINGE | INTRAVENOUS | Status: DC | PRN
  Administered 2018-06-26: 50 mg via INTRAVENOUS
  Administered 2018-06-26: 20 mg via INTRAVENOUS
  Administered 2018-06-26: 10 mg via INTRAVENOUS

## 2018-06-26 MED ORDER — ONDANSETRON HCL 4 MG/2ML IJ SOLN
INTRAMUSCULAR | Status: DC | PRN
  Administered 2018-06-26: 4 mg via INTRAVENOUS

## 2018-06-26 MED ORDER — ALUM & MAG HYDROXIDE-SIMETH 200-200-20 MG/5ML PO SUSP: 15.0000 mL | ORAL | Status: DC | PRN

## 2018-06-26 MED ORDER — ASPIRIN EC 325 MG PO TBEC
325.0000 mg | DELAYED_RELEASE_TABLET | Freq: Every day | ORAL | Status: DC
  Administered 2018-06-26 – 2018-06-27 (×2): 325 mg via ORAL
  Filled 2018-06-26 (×2): qty 1

## 2018-06-26 MED ORDER — FENTANYL CITRATE (PF) 250 MCG/5ML IJ SOLN
INTRAMUSCULAR | Status: AC
  Filled 2018-06-26: qty 5

## 2018-06-26 MED ORDER — SODIUM CHLORIDE 0.9 % IV SOLN
INTRAVENOUS | Status: DC
  Administered 2018-06-26: 22:00:00 via INTRAVENOUS

## 2018-06-26 MED ORDER — FENTANYL CITRATE (PF) 100 MCG/2ML IJ SOLN
25.0000 ug | INTRAMUSCULAR | Status: DC | PRN
  Administered 2018-06-26 (×2): 50 ug via INTRAVENOUS

## 2018-06-26 MED ORDER — NITROGLYCERIN 0.2 MG/ML ON CALL CATH LAB
INTRAVENOUS | Status: DC | PRN
  Administered 2018-06-26 (×3): 80 ug via INTRAVENOUS

## 2018-06-26 MED ORDER — METOPROLOL TARTRATE 5 MG/5ML IV SOLN: 2.0000 mg | INTRAVENOUS | Status: DC | PRN

## 2018-06-26 MED ORDER — PROTAMINE SULFATE 10 MG/ML IV SOLN
INTRAVENOUS | Status: DC | PRN
  Administered 2018-06-26: 50 mg via INTRAVENOUS

## 2018-06-26 MED ORDER — SODIUM CHLORIDE 0.9 % IV SOLN: 500.0000 mL | Freq: Once | INTRAVENOUS | Status: DC | PRN

## 2018-06-26 MED ORDER — GUAIFENESIN-DM 100-10 MG/5ML PO SYRP: 15.0000 mL | ORAL_SOLUTION | ORAL | Status: DC | PRN

## 2018-06-26 MED ORDER — POTASSIUM CHLORIDE CRYS ER 20 MEQ PO TBCR: 20.0000 meq | EXTENDED_RELEASE_TABLET | Freq: Every day | ORAL | Status: DC | PRN

## 2018-06-26 MED ORDER — EPHEDRINE SULFATE-NACL 50-0.9 MG/10ML-% IV SOSY
PREFILLED_SYRINGE | INTRAVENOUS | Status: DC | PRN
  Administered 2018-06-26 (×3): 5 mg via INTRAVENOUS
  Administered 2018-06-26: 10 mg via INTRAVENOUS

## 2018-06-26 MED ORDER — SODIUM CHLORIDE 0.9 % IV SOLN
INTRAVENOUS | Status: DC
  Administered 2018-06-26: 16:00:00 via INTRAVENOUS

## 2018-06-26 MED ORDER — SODIUM CHLORIDE 0.9 % IV SOLN
INTRAVENOUS | Status: DC | PRN
  Administered 2018-06-26: 70 ug/min via INTRAVENOUS

## 2018-06-26 MED ORDER — ESMOLOL HCL 100 MG/10ML IV SOLN
INTRAVENOUS | Status: DC | PRN
  Administered 2018-06-26 (×2): 50 mg via INTRAVENOUS

## 2018-06-26 MED ORDER — PHENOL 1.4 % MT LIQD: 1.0000 | OROMUCOSAL | Status: DC | PRN

## 2018-06-26 MED ORDER — ONDANSETRON HCL 4 MG/2ML IJ SOLN: 4.0000 mg | Freq: Once | INTRAMUSCULAR | Status: DC | PRN

## 2018-06-26 MED ORDER — DOCUSATE SODIUM 100 MG PO CAPS
100.0000 mg | ORAL_CAPSULE | Freq: Every day | ORAL | Status: DC
  Administered 2018-06-27: 100 mg via ORAL
  Filled 2018-06-26: qty 1

## 2018-06-26 MED ORDER — DEXAMETHASONE SODIUM PHOSPHATE 10 MG/ML IJ SOLN
INTRAMUSCULAR | Status: DC | PRN
  Administered 2018-06-26: 10 mg via INTRAVENOUS

## 2018-06-26 MED ORDER — SODIUM CHLORIDE 0.9 % IV SOLN
0.1500 ug/kg/min | INTRAVENOUS | Status: AC
  Administered 2018-06-26: .15 ug/kg/min via INTRAVENOUS
  Filled 2018-06-26: qty 2000

## 2018-06-26 MED ORDER — NITROGLYCERIN IN D5W 200-5 MCG/ML-% IV SOLN
INTRAVENOUS | Status: DC | PRN
  Administered 2018-06-26: 10 ug/min via INTRAVENOUS

## 2018-06-26 MED ORDER — ESMOLOL HCL 100 MG/10ML IV SOLN
INTRAVENOUS | Status: AC
  Filled 2018-06-26: qty 10

## 2018-06-26 MED ORDER — SODIUM CHLORIDE (PF) 0.9 % IJ SOLN
INTRAMUSCULAR | Status: AC
  Filled 2018-06-26: qty 20

## 2018-06-26 MED ORDER — FENTANYL CITRATE (PF) 100 MCG/2ML IJ SOLN
INTRAMUSCULAR | Status: DC | PRN
  Administered 2018-06-26: 150 ug via INTRAVENOUS
  Administered 2018-06-26: 50 ug via INTRAVENOUS

## 2018-06-26 SURGICAL SUPPLY — 52 items
CANISTER SUCT 3000ML PPV (MISCELLANEOUS) ×4 IMPLANT
CATH ROBINSON RED A/P 18FR (CATHETERS) ×4 IMPLANT
CATH SUCT 10FR WHISTLE TIP (CATHETERS) ×4 IMPLANT
CLIP VESOCCLUDE MED 6/CT (CLIP) ×4 IMPLANT
CLIP VESOCCLUDE SM WIDE 6/CT (CLIP) ×4 IMPLANT
COVER PROBE W GEL 5X96 (DRAPES) IMPLANT
COVER WAND RF STERILE (DRAPES) ×4 IMPLANT
CRADLE DONUT ADULT HEAD (MISCELLANEOUS) ×4 IMPLANT
DERMABOND ADHESIVE PROPEN (GAUZE/BANDAGES/DRESSINGS) ×2
DERMABOND ADVANCED (GAUZE/BANDAGES/DRESSINGS) ×2
DERMABOND ADVANCED .7 DNX12 (GAUZE/BANDAGES/DRESSINGS) ×2 IMPLANT
DERMABOND ADVANCED .7 DNX6 (GAUZE/BANDAGES/DRESSINGS) IMPLANT
DRAIN CHANNEL 15F RND FF W/TCR (WOUND CARE) IMPLANT
ELECT REM PT RETURN 9FT ADLT (ELECTROSURGICAL) ×4
ELECTRODE REM PT RTRN 9FT ADLT (ELECTROSURGICAL) ×2 IMPLANT
EVACUATOR SILICONE 100CC (DRAIN) IMPLANT
GLOVE BIOGEL PI IND STRL 6.5 (GLOVE) IMPLANT
GLOVE BIOGEL PI IND STRL 7.5 (GLOVE) ×2 IMPLANT
GLOVE BIOGEL PI INDICATOR 6.5 (GLOVE) ×4
GLOVE BIOGEL PI INDICATOR 7.5 (GLOVE) ×6
GLOVE ECLIPSE 6.5 STRL STRAW (GLOVE) ×2 IMPLANT
GLOVE ECLIPSE 7.0 STRL STRAW (GLOVE) ×2 IMPLANT
GLOVE SURG SS PI 7.5 STRL IVOR (GLOVE) ×4 IMPLANT
GOWN STRL REUS W/ TWL LRG LVL3 (GOWN DISPOSABLE) ×4 IMPLANT
GOWN STRL REUS W/ TWL XL LVL3 (GOWN DISPOSABLE) ×2 IMPLANT
GOWN STRL REUS W/TWL LRG LVL3 (GOWN DISPOSABLE) ×8
GOWN STRL REUS W/TWL XL LVL3 (GOWN DISPOSABLE) ×2
HEMOSTAT SNOW SURGICEL 2X4 (HEMOSTASIS) ×2 IMPLANT
INSERT FOGARTY SM (MISCELLANEOUS) IMPLANT
KIT BASIN OR (CUSTOM PROCEDURE TRAY) ×4 IMPLANT
KIT SHUNT ARGYLE CAROTID ART 6 (VASCULAR PRODUCTS) IMPLANT
KIT TURNOVER KIT B (KITS) ×4 IMPLANT
NDL HYPO 25GX1X1/2 BEV (NEEDLE) IMPLANT
NEEDLE HYPO 25GX1X1/2 BEV (NEEDLE) IMPLANT
NS IRRIG 1000ML POUR BTL (IV SOLUTION) ×14 IMPLANT
PACK CAROTID (CUSTOM PROCEDURE TRAY) ×4 IMPLANT
PAD ARMBOARD 7.5X6 YLW CONV (MISCELLANEOUS) ×8 IMPLANT
PATCH VASC XENOSURE 1CMX6CM (Vascular Products) ×2 IMPLANT
PATCH VASC XENOSURE 1X6 (Vascular Products) IMPLANT
SHUNT CAROTID BYPASS 10 (VASCULAR PRODUCTS) IMPLANT
SHUNT CAROTID BYPASS 12FRX15.5 (VASCULAR PRODUCTS) IMPLANT
SUT ETHILON 3 0 PS 1 (SUTURE) IMPLANT
SUT PROLENE 6 0 BV (SUTURE) ×8 IMPLANT
SUT PROLENE 7 0 BV 1 (SUTURE) IMPLANT
SUT SILK 3 0 (SUTURE)
SUT SILK 3-0 18XBRD TIE 12 (SUTURE) IMPLANT
SUT VIC AB 3-0 SH 27 (SUTURE) ×4
SUT VIC AB 3-0 SH 27X BRD (SUTURE) ×4 IMPLANT
SUT VICRYL 4-0 PS2 18IN ABS (SUTURE) ×4 IMPLANT
SYR CONTROL 10ML LL (SYRINGE) IMPLANT
TOWEL GREEN STERILE (TOWEL DISPOSABLE) ×4 IMPLANT
WATER STERILE IRR 1000ML POUR (IV SOLUTION) ×4 IMPLANT

## 2018-06-26 NOTE — Progress Notes (Signed)
PT Cancellation Note  Patient Details Name: [name] MRN: [id number] DOB: [birth year]   Cancelled Treatment:    Reason Eval/Treat Not Completed: (P) Medical issues which prohibited therapy;Patient at procedure or test/unavailable(Pt remains off unit s/p CEA of R side.  Will continue efforts per POC.  )   Ataya Murdy Eli Hose 06/26/2018, 2:09 PM Governor Rooks, PTA Acute Rehabilitation Services Pager [phone] Office [phone]

## 2018-06-26 NOTE — Anesthesia Procedure Notes (Signed)
Arterial Line Insertion Start/End12/05/2018 7:55 AM, 06/26/2018 8:04 AM Performed by: Cleda Daub, CRNA, CRNA  Patient location: Pre-op. Preanesthetic checklist: patient identified, IV checked, site marked, risks and benefits discussed, surgical consent, monitors and equipment checked, pre-op evaluation and anesthesia consent Right, radial was placed Catheter size: 20 G Hand hygiene performed , maximum sterile barriers used  and Seldinger technique used Allen's test indicative of satisfactory collateral circulation Attempts: 1 Procedure performed without using ultrasound guided technique. Ultrasound Notes:anatomy identified, needle tip was noted to be adjacent to the nerve/plexus identified and no ultrasound evidence of intravascular and/or intraneural injection Following insertion, dressing applied and Biopatch. Post procedure assessment: normal  Patient tolerated the procedure well with no immediate complications.

## 2018-06-26 NOTE — Progress Notes (Signed)
Patient arrived to 4E room 15 at this time. Telemetry applied and CCMD notified. CHG bath done. V/s and assessment complete. Will continue to monitor.   Emelda Fear, RN

## 2018-06-26 NOTE — Telephone Encounter (Signed)
sch appt lvm mld ltr 07/22/2018 130pm p/o MD

## 2018-06-26 NOTE — Anesthesia Postprocedure Evaluation (Signed)
Anesthesia Post Note  Patient: [name]  Procedure(s) Performed: ENDARTERECTOMY CAROTID RIGHT (Right ) PATCH ANGIOPLASTY USING A XENOSURE 1cm x 6cm BIOLOGIC PATCH (Right Neck)     Patient location during evaluation: PACU Anesthesia Type: General Level of consciousness: awake and alert, oriented and awake Pain management: pain level controlled Vital Signs Assessment: post-procedure vital signs reviewed and stable Respiratory status: spontaneous breathing, nonlabored ventilation and respiratory function stable Cardiovascular status: blood pressure returned to baseline and stable Postop Assessment: no apparent nausea or vomiting Anesthetic complications: no    Last Vitals:  Vitals:   06/26/18 1215 06/26/18 1230  BP:    Pulse: 99 97  Resp: 18 18  Temp:    SpO2: 97% 95%    Last Pain:  Vitals:   06/26/18 1230  TempSrc:   PainSc: 0-No pain                 Catalina Gravel

## 2018-06-26 NOTE — Interval H&P Note (Signed)
History and Physical Interval Note:  06/26/2018 8:00 AM  [name]  has presented today for surgery, with the diagnosis of RIGHT CAROTID STENOSIS  The various methods of treatment have been discussed with the patient and family. After consideration of risks, benefits and other options for treatment, the patient has consented to  Procedure(s): ENDARTERECTOMY CAROTID RIGHT (Right) as a surgical intervention .  The patient's history has been reviewed, patient examined, no change in status, stable for surgery.  I have reviewed the patient's chart and labs.  Questions were answered to the patient's satisfaction.     Annamarie Major

## 2018-06-26 NOTE — Progress Notes (Signed)
Left message for Dr. Trula Slade with on-call service to notify him that pt's nasal swab was negative for MRSA, but positive for staphylococcus aureus.

## 2018-06-26 NOTE — Consult Note (Signed)
   NAME:  [name], MRN:  [id number], DOB:  07-18-1950, LOS: 4 ADMISSION DATE:  06/21/2018, CONSULTATION DATE: 06/23/2018 REFERRING MD: Roger Shelter, CHIEF COMPLAINT: Lung mass  History of present illness   This is a 67 year old female with a past medical history of hypertension tobacco abuse, pack to pack and a half for approximately 10 years, presented with acute GI bleeding status post EGD which revealed multiple ulcers in the duodenum with history of Goody powder use.  She ultimately came back to the ER with complaints of left arm weakness and inability to lift the left wrist.  MRI of the brain and C-spine was done which showed multiple infarcts within the watershed area of the right cerebrum.  CTA of head and neck revealed a large 5.5 cm paratracheal mass.  Pulmonary was consulted recommendations and management.  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  Past Medical History   Past Medical History:  Diagnosis Date  . Chronic pain   . Hyperlipidemia   . Hypertension      Significant Hospital Events   CT chest 06/23/2018  Consults:  Vascular surgery 06/22/2018: Planned carotid endarterectomy 06/26/2018  Procedures:  06/19/2018: EGD duodenal ulcers  Significant Diagnostic Tests:  CTA head and neck: Severe high-grade stenosis of left vertebral artery CT chest: 4 cm right lower lobe lung mass, metastatic mediastinal and hilar adenopathy.  Micro Data:  None  Antimicrobials:  None  Interim history/subjective:  She underwent CEA this am, went well.   Objective   Blood pressure 121/88, pulse 94, temperature 98.1 F (36.7 C), temperature source Oral, resp. rate (!) 26, height 5\' 4"  (1.626 m), weight 99.2 kg, SpO2 96 %.        Intake/Output Summary (Last 24 hours) at 06/26/2018 1717 Last data filed at 06/26/2018 1600 Gross per 24 hour  Intake 1517.23 ml  Output 50 ml  Net 1467.23 ml   Filed Weights   06/22/18 0049  Weight:  99.2 kg    Examination: General: Ill-appearing overweight woman HENT: Oropharynx clear, pupils equal Lungs: Clear bilaterally, no crackles, no wheezes Cardiovascular: Regular, no murmur Abdomen:, Soft, nondistended, positive bowel sounds Extremities: No edema Neuro: Decreased movement and strength on the left.  Resolved Hospital Problem list    Assessment & Plan:   Right lower lobe lung mass with associated enlarged mediastinal and hilar adenopathy concerning for a primary bronchogenic carcinoma.  I discussed the CT scan and the issues with the patient and also with Dr. Trula Slade.  Recommend bronchoscopy with endobronchial ultrasound, needle biopsies, possible navigation to the more peripheral right lower lobe lesion if no diagnosis is obtained from the more proximal mediastinal mass.  We will plan to tentatively arrange for 12/18.  This can be done as an outpatient.  I would like for her to stop her aspirin 2 days prior.  I will work on scheduling, follow-up with her here to confirm.   Baltazar Apo, MD, PhD 06/26/2018, 5:19 PM [facility] [phone] or if no answer [phone]

## 2018-06-26 NOTE — Anesthesia Procedure Notes (Signed)
Procedure Name: Intubation Date/Time: 06/26/2018 9:02 AM Performed by: Cleda Daub, CRNA Pre-anesthesia Checklist: Patient identified, Emergency Drugs available, Suction available and Patient being monitored Patient Re-evaluated:Patient Re-evaluated prior to induction Oxygen Delivery Method: Circle system utilized Preoxygenation: Pre-oxygenation with 100% oxygen Induction Type: IV induction Ventilation: Mask ventilation without difficulty and Mask ventilation throughout procedure Laryngoscope Size: Mac and 3 Grade View: Grade I Nasal Tubes: Right Tube size: 7.0 mm Number of attempts: 1 Airway Equipment and Method: Stylet Placement Confirmation: ETT inserted through vocal cords under direct vision,  positive ETCO2 and breath sounds checked- equal and bilateral Secured at: 22 cm Tube secured with: Tape Dental Injury: Teeth and Oropharynx as per pre-operative assessment

## 2018-06-26 NOTE — Transfer of Care (Signed)
Immediate Anesthesia Transfer of Care Note  Patient: [name]  Procedure(s) Performed: ENDARTERECTOMY CAROTID RIGHT (Right ) PATCH ANGIOPLASTY USING A XENOSURE 1cm x 6cm BIOLOGIC PATCH (Right Neck)  Patient Location: PACU  Anesthesia Type:General  Level of Consciousness: awake, alert , oriented and patient cooperative  Airway & Oxygen Therapy: Patient Spontanous Breathing and Patient connected to face mask oxygen  Post-op Assessment: Report given to RN and Post -op Vital signs reviewed and stable  Post vital signs: Reviewed and stable  Last Vitals:  Vitals Value Taken Time  BP    Temp    Pulse 102 06/26/2018 11:32 AM  Resp 16 06/26/2018 11:32 AM  SpO2 95 % 06/26/2018 11:32 AM  Vitals shown include unvalidated device data.  Last Pain:  Vitals:   06/26/18 1117  TempSrc:   PainSc: (P) 0-No pain      Patients Stated Pain Goal: 0 (84/53/64 6803)  Complications: No apparent anesthesia complications

## 2018-06-26 NOTE — Op Note (Signed)
Patient name: [name] MRN: [id number] DOB: 1951/03/09 Sex: female  06/21/2018 - 06/26/2018 Pre-operative Diagnosis: Symptomatic   right carotid stenosis Post-operative diagnosis:  Same Surgeon:  Annamarie Major Assistants:  Laurence Slate Procedure:    right carotid Endarterectomy with bovine pericardial  patch angioplasty Anesthesia:  General Blood Loss:  100 cc Specimens:  none  Findings:  80 %stenosis; Thrombus:  None  Indications:  67 yo presented with a right brain stroke and left arm weakness.  She has a tight right carotid as the etiology ofher stroke. She has recently had a GI bleed.  We discussed proceeding with CEA for stoke prevention  Procedure:  The patient was identified in the holding area and taken to [place] [facility]  The patient was then placed supine on the table.   General endotrachial anesthesia was administered.  The patient was prepped and draped in the usual sterile fashion.  A time out was called and antibiotics were administered.  The incision was made along the anterior border of the right sternocleidomastoid muscle.  Cautery was used to dissect through the subcutaneous tissue.  The platysma muscle was divided with cautery.  The internal jugular vein was exposed along its anterior medial border.  The common facial vein was exposed and then divided between 2-0 silk ties and metal clips.  The common carotid artery was then circumferentially exposed and encircled with an umbilical tape.  The vagus nerve was identified and protected.  Next sharp dissection was used to expose the external carotid artery and the superior thyroid artery.  The were encircled with a blue vessel loop and a 2-0 silk tie respectively.  Finally, the internal carotid was carefully dissected free.  An umbilical tape was placed around the internal carotid artery distal to the diseased segment.  The hypoglossal nerve was visualized throughout and protected.  The patient was given systemic  heparinization.  A bovine carotid patch was selected and prepared on the back table.  A 10 french shunt was also prepared.  After blood pressure readings were appropriate and the heparin had been given time to circulate, the internal carotid artery was occluded with a baby Gregory clamp.  The external and common carotid arteries were then occluded with vascular clamps and the 2-0 tie tightened on the superior thyroid artery.  A #11 blade was used to make an arteriotomy in the common carotid artery.  This was extended with Potts scissors along the anterior and lateral border of the common and internal carotid artery.  Approximately 80% stenosis was identified.  There was no thrombus identified.  The 10 french shunt was then placed.  A kleiner kuntz elevator was used to perform endarterectomy.  An eversion endarterectomy was performed in the external carotid artery.  A good distal endpoint was obtained in the internal carotid artery.  The specimen was removed and sent to pathology.  Heparinized saline was used to irrigate the endarterectomized field.  All potential embolic debris was removed.  Bovine pericardial patch angioplasty was then performed using a running 6-0 Prolene. Just prior to completion of the repair, the shunt was removed. The common internal and external carotid arteries were all appropriately flushed. The artery was again irrigated with heparin saline.  The anastomosis was then secured. The clamp was first released on the external carotid artery followed by the common carotid artery approximately 30 seconds later, bloodflow was reestablish through the internal carotid artery.  Next, a hand-held  Doppler was used to evaluate the  signals in the common, external, and internal  carotid arteries, all of which had appropriate signals. I then administered  50 mg protamine. The wound was then irrigated.  After hemostasis was achieved, the carotid sheath was reapproximated with 3-0 Vicryl. The  platysma  muscle was reapproximated with running 3-0 Vicryl. The skin  was closed with 4-0 Vicryl. Dermabond was placed on the skin. The  patient was then successfully extubated. Her neurologic exam was  similar to his preprocedural exam. The patient was then taken to recovery room  in stable condition. There were no complications.     Disposition:  To PACU in stable condition.  Relevant Operative Details:  Very deep carotid within the neck.  Focal stenosis at the bifurcation.  Externally rotated ECA, requiring ligation of the superior thyroid artery for adequate exposure.  Shunt utilized.  Hypoglossal nerve visualized and protected  V. Annamarie Major, M.D. [facility] Office: [phone] Pager:  [phone]

## 2018-06-26 NOTE — Telephone Encounter (Signed)
-----   Message from Ulyses Amor, [place] sent at 06/26/2018 11:21 AM EST -----  S/P right CEA f/u with Dr. Trula Slade in 2-3 weeks.

## 2018-06-26 NOTE — Progress Notes (Signed)
PROGRESS NOTE    [name]  WNI:[id number] DOB: 01-11-1951 DOA: 06/21/2018 PCP: [name], [name], MD  Brief Narrative:67 y.o.femalewithprevious history of hypertension off antihypertensives for many years now, tobacco abuse who was just discharged yesterday after being admitted for acute GI bleed EGD showed multiple ulcers in the duodenum and also ulcers in the gastric mucosa and esophagus presents to the ER because of persistent weakness and unable to dorsiflex left hand. Patient states her symptoms started yesterday after discharge around 3 PM which improved briefly but started again worsening this morning. Denies any weakness of the other extremities or difficulty speaking swallowing or any visual symptoms.  ED Course:In the ER patient had CT head which was not showing anything acute. MRI of the brain and C-spine was done which shows multiple infarcts in the watershed area of the right cerebrum and admitted for further work-up for acute stroke.  Assessment & Plan:   Principal Problem:   Acute CVA (cerebrovascular accident) ([place]) Active Problems:   Upper GI bleed   Acute blood loss anemia  Acute CVA -right hemispheric watershed possible source proximal right ICA stenosis -Improving arm function,and hand. -S/P R CEA 06/26/18  -Patient was outside the window for TPA treatment, -Also recent history of GI bleed was not anticoagulated including aspirin Neurologist team,following.    -MRI>>Numerous small infarctions scattered within the right cerebral hemisphere, probably watershed distribution  CTA.>>Severe near occlusive stenosis at the origin of the right ICA, almost certainly the causative agent for the previously identified right cerebral watershed type infarcts. 2. Severe high-grade stenosis at the origin of the left vertebral artery, with additional multifocal moderate to severe stenoses just distal within the left V1 segment.  - Echo-Left ventricle: The  cavity size was normal. Wall thickness was increased in a pattern of mild LVH. Systolic function was normal. The estimated ejection fraction was in the range of 60% to 65%. Wall motion was normal; there were no regional wall motion abnormalities. Left ventricular diastolic function parameters were normal. - Atrial septum: No defect or patent foramen ovale was identified   Continue statins, avoiding antiplatelet therapy due to recent GI bleed eventually she needs to be on aspirin or Plavix.  At this time neurology anticipating starting the patient on aspirin and PPI once stable  Right ICA stenosis, severe S/P R CEA   Recent GI bleedwith acute blood loss anemia -Recent hospitalization, H&H stable, follow CBC. Transfuse if less than 7. On Protonix twice daily. - EGD showed multiple ulcers in the duodenum with gastric ulcers and esophageal ulcers.   HTN -previous history of hypertension has not been taking the antihypertensives for many years.   Tobacco abuse -advised to quit smoking.  Chronic back pain -PRN analgesics, try to avoid NSAIDs due to GI bleed  Approximate 5.5 cm right paratracheal mass FOR BIOPSY AS an outpatient on 07/03/18  Hyperlipidemia -LDL 108, goal less than 70, currently started on Lipitor 40 mg daily  Hypokalemia replete K     Estimated body mass index is 37.54 kg/m as calculated from the following:   Height as of this encounter: 5\' 4"  (1.626 m).   Weight as of this encounter: 99.2 kg.  DVT prophylaxis: scd Code Status full Family Communication:none Disposition Plan: possible dc in am if stable Consultants:  Vascular p[facility]m Procedures:none Antimicrobialsnone  Subjective: Seen after surgery awake alert  Objective: Vitals:   06/26/18 1117 06/26/18 1130 06/26/18 1145 06/26/18 1200  BP: 105/85   (!) 144/81  Pulse: (!) 102 (!)  102 (!) 102 (!) 102  Resp: 20 20 19 16   Temp: (!) 97.2 F (36.2 C)     TempSrc:      SpO2:  100% 95% 95% 96%  Weight:      Height:        Intake/Output Summary (Last 24 hours) at 06/26/2018 1210 Last data filed at 06/26/2018 1116 Gross per 24 hour  Intake 1500 ml  Output 50 ml  Net 1450 ml   Filed Weights   06/22/18 0049  Weight: 99.2 kg    Examination:R cea incision clear  General exam: Appears calm and comfortable  Respiratory system: Clear to auscultation. Respiratory effort normal. Cardiovascular system: S1 & S2 heard, RRR. No JVD, murmurs, rubs, gallops or clicks. No pedal edema. Gastrointestinal system: Abdomen is nondistended, soft and nontender. No organomegaly or masses felt. Normal bowel sounds heard. Central nervous system: Alert and oriented. No focal neurological deficits. Extremities: Symmetric 5 x 5 power. Skin: No rashes, lesions or ulcers Psychiatry: Judgement and insight appear normal. Mood & affect appropriate.     Data Reviewed: I have personally reviewed following labs and imaging studies  CBC: Recent Labs  Lab 06/20/18 0628 06/21/18 1024 06/21/18 1213 06/22/18 0004 06/25/18 1107 06/26/18 0636  WBC 7.9 9.3 9.3 8.4 7.6 6.8  NEUTROABS  --   --  7.1  --   --   --   HGB 8.1* 8.5* 8.2* 7.5* 8.2* 8.6*  HCT 26.6* 25.5* 26.3* 24.7* 27.3* 28.6*  MCV 108.6* 98.9 105.6* 103.8* 103.4* 104.0*  PLT 179  --  211 226 349 629   Basic Metabolic Panel: Recent Labs  Lab 06/20/18 0628 06/21/18 1213 06/22/18 0004 06/25/18 1107 06/26/18 0636  NA 140 141 139 140 141  K 3.8 3.6 3.2* 3.8 4.0  CL 112* 106 105 106 106  CO2 24 26 26 24 27   GLUCOSE 125* 106* 102* 101* 106*  BUN 16 11 9  6* 6*  CREATININE 0.58 0.76 0.88 0.81 0.93  CALCIUM 8.0* 8.8* 8.4* 8.9 8.6*  MG  --   --   --  1.9  --    GFR: Estimated Creatinine Clearance: 69 mL/min (by C-G formula based on SCr of 0.93 mg/dL). Liver Function Tests: Recent Labs  Lab 06/21/18 1213 06/22/18 0004 06/25/18 1107  AST 18 15 15   ALT 15 13 14   ALKPHOS 45 46 49  BILITOT 0.6 0.5 0.7  PROT 5.9*  5.1* 5.8*  ALBUMIN 3.4* 2.8* 3.0*   No results for input(s): LIPASE, AMYLASE in the last 168 hours. No results for input(s): AMMONIA in the last 168 hours. Coagulation Profile: Recent Labs  Lab 06/21/18 1213 06/26/18 0636  INR 0.90 1.01   Cardiac Enzymes: Recent Labs  Lab 06/21/18 1213  TROPONINI <0.03   BNP (last 3 results) No results for input(s): PROBNP in the last 8760 hours. HbA1C: No results for input(s): HGBA1C in the last 72 hours. CBG: Recent Labs  Lab 06/21/18 1328 06/23/18 1119 06/23/18 1638  GLUCAP 92 93 140*   Lipid Profile: No results for input(s): CHOL, HDL, LDLCALC, TRIG, CHOLHDL, LDLDIRECT in the last 72 hours. Thyroid Function Tests: No results for input(s): TSH, T4TOTAL, FREET4, T3FREE, THYROIDAB in the last 72 hours. Anemia Panel: No results for input(s): VITAMINB12, FOLATE, FERRITIN, TIBC, IRON, RETICCTPCT in the last 72 hours. Sepsis Labs: No results for input(s): PROCALCITON, LATICACIDVEN in the last 168 hours.  Recent Results (from the past 240 hour(s))  Surgical PCR screen     Status:  Abnormal   Collection Time: 06/25/18  4:28 PM  Result Value Ref Range Status   MRSA, PCR NEGATIVE NEGATIVE Final   Staphylococcus aureus POSITIVE (A) NEGATIVE Final    Comment: (NOTE) The Xpert SA Assay (FDA approved for NASAL specimens in patients 57 years of age and older), is one component of a comprehensive surveillance program. It is not intended to diagnose infection nor to guide or monitor treatment.          Radiology Studies: No results found.      Scheduled Meds: . [MAR Hold] atorvastatin  40 mg Oral q1800  . [MAR Hold] Chlorhexidine Gluconate Cloth  6 each Topical Daily  . [MAR Hold] mupirocin ointment  1 application Nasal BID  . [MAR Hold] pantoprazole  40 mg Oral BID AC  . [MAR Hold] polyethylene glycol  17 g Oral Daily   Continuous Infusions:   LOS: 4 days        Georgette Shell, MD [facility]   If  7PM-7AM, please contact night-coverage [email or web address] Password [email or web address] 06/26/2018, 12:10 PM

## 2018-06-26 NOTE — Progress Notes (Addendum)
  Day of Surgery Note    Subjective:  Awake in recovery; very very thankful to Dr. Trula Slade.  Says everything is working!   Vitals:   06/26/18 1215 06/26/18 1230  BP:    Pulse: 99 97  Resp: 18 18  Temp:    SpO2: 97% 95%    Incisions:   Clean and dry with minimal ecchymosis Extremities:  Moving all extremities; left hand grip slightly less than right hand grip Cardiac:  regular Lungs:  Non labored Neuro:  In tact; tongue is midline   Assessment/Plan:  This is a 66 y.o. female who is s/p  Right carotid endarterectomy with bovine patch angioplasty   -pt doing well in recovery -if she does well overnight, anticipate discharge from vascular standpoint tomorrow. -to [place] when bed available   Leontine Locket, PA-C 06/26/2018 12:54 PM [phone]  Agree with the above Spoke with Dr. Lamonte Sakai.  He will see her regarding biopsy  Annamarie Major

## 2018-06-27 ENCOUNTER — Encounter (HOSPITAL_COMMUNITY): Payer: Self-pay | Admitting: Surgery

## 2018-06-27 LAB — CBC
HCT: 24.9 % — ABNORMAL LOW (ref 36.0–46.0)
Hemoglobin: 7.3 g/dL — ABNORMAL LOW (ref 12.0–15.0)
MCH: 30.9 pg (ref 26.0–34.0)
MCHC: 29.3 g/dL — ABNORMAL LOW (ref 30.0–36.0)
MCV: 105.5 fL — ABNORMAL HIGH (ref 80.0–100.0)
Platelets: 324 10*3/uL (ref 150–400)
RBC: 2.36 MIL/uL — ABNORMAL LOW (ref 3.87–5.11)
RDW: 14.9 % (ref 11.5–15.5)
WBC: 10.7 10*3/uL — AB (ref 4.0–10.5)
nRBC: 0 % (ref 0.0–0.2)

## 2018-06-27 LAB — BASIC METABOLIC PANEL
Anion gap: 7 (ref 5–15)
BUN: 6 mg/dL — ABNORMAL LOW (ref 8–23)
CO2: 22 mmol/L (ref 22–32)
Calcium: 7 mg/dL — ABNORMAL LOW (ref 8.9–10.3)
Chloride: 113 mmol/L — ABNORMAL HIGH (ref 98–111)
Creatinine, Ser: 0.77 mg/dL (ref 0.44–1.00)
GFR calc Af Amer: >60 mL/min (ref >60)
GFR calc non Af Amer: >60 mL/min (ref >60)
Glucose, Bld: 117 mg/dL — ABNORMAL HIGH (ref 70–99)
Potassium: 3.6 mmol/L (ref 3.5–5.1)
SODIUM: 142 mmol/L (ref 135–145)

## 2018-06-27 LAB — PREPARE RBC (CROSSMATCH)

## 2018-06-27 MED ORDER — SODIUM CHLORIDE 0.9% IV SOLUTION
Freq: Once | INTRAVENOUS | Status: AC
  Administered 2018-06-27: 10 mL via INTRAVENOUS

## 2018-06-27 MED ORDER — ASPIRIN 325 MG PO TBEC: 325.0000 mg | DELAYED_RELEASE_TABLET | Freq: Every day | ORAL | 0 refills | Status: DC

## 2018-06-27 MED ORDER — OXYCODONE HCL 5 MG PO TABS: 5.0000 mg | ORAL_TABLET | ORAL | 0 refills | Status: DC | PRN

## 2018-06-27 MED ORDER — ATORVASTATIN CALCIUM 40 MG PO TABS: 40.0000 mg | ORAL_TABLET | Freq: Every day | ORAL | 1 refills | Status: DC

## 2018-06-27 NOTE — Discharge Summary (Addendum)
Physician Discharge Summary  [name] EPP:[id number] DOB: 1951-03-23 DOA: 06/21/2018  PCP: [name], [name], MD  Admit date: 06/21/2018 Discharge date: 06/27/2018  Admitted From: Home Disposition: Home Recommendations for Outpatient Follow-up:  1. Follow up with PCP in 1-2 weeks 2. Please obtain BMP/CBC in one week 3. Follow-up with Dr. Trula Slade 4. Follow-up with Dr. Lamonte Sakai 12/18 for bronchoscopy and biopsy  Home Health:none Equipment/Devices none Discharge Condition: Stable  CODE STATUS: Full code Diet recommendation: Cardiac  Brief/Interim Summary:67 y.o.femalewithprevious history of hypertension off antihypertensives for many years now, tobacco abuse who was just discharged yesterday after being admitted for acute GI bleed EGD showed multiple ulcers in the duodenum and also ulcers in the gastric mucosa and esophagus presents to the ER because of persistent weakness and unable to dorsiflex left hand. Patient states her symptoms started yesterday after discharge around 3 PM which improved briefly but started again worsening this morning. Denies any weakness of the other extremities or difficulty speaking swallowing or any visual symptoms.  ED Course:In the ER patient had CT head which was not showing anything acute. MRI of the brain and C-spine was done which shows multiple infarcts in the watershed area of the right cerebrum and admitted for further work-up for acute stroke.  Discharge Diagnoses:  Principal Problem:   Acute CVA (cerebrovascular accident) ([place]) Active Problems:   Upper GI bleed   Acute blood loss anemia   Right lower lobe lung mass  Acute CVA -right hemispheric watershed possible source proximal right ICA stenosis -Improving arm function,and hand. -S/P R CEA 06/26/18  -Patient was outside the window for TPA treatment, -Also recent history of GI bleed was not anticoagulated including aspirin Neurologist team,following.    -MRI>>Numerous  small infarctions scattered within the right cerebral hemisphere, probably watershed distribution  CTA.>>Severe near occlusive stenosis at the origin of the right ICA, almost certainly the causative agent for the previously identified right cerebral watershed type infarcts. 2. Severe high-grade stenosis at the origin of the left vertebral artery, with additional multifocal moderate to severe stenoses just distal within the left V1 segment.    Echo-Left ventricle: The cavity size was normal. Wall thickness was increased in a pattern of mild LVH. Systolic function was normal. The estimated ejection fraction was in the range of 60% to 65%. Wall motion was normal; there were no regional wall motion abnormalities. Left ventricular diastolic function parameters were normal. - Atrial septum: No defect or patent foramen ovale was identified Continue statins, continue aspirin hold aspirin 06/30/2018 for bronc 07/03/2018.  Resume aspirin after bronchoscopy and biopsy.   Right ICA stenosis, severe S/P R CEA 06/26/2018.   Recent GI bleedwith acute blood loss anemia.  Hemoglobin 7.3 on day of discharge will transfuse 1 unit prior to discharge. -- EGD showed multiple ulcers in the duodenum with gastric ulcers and esophageal ulcers.   HTN -previous history of hypertension has not been taking the antihypertensives for many years. Resume ACE inhibitor.  Estimated body mass index is 37.54 kg/m as calculated from the following:   Height as of this encounter: 5\' 4"  (1.626 m).   Weight as of this encounter: 99.2 kg.  Tobacco abuse -advised to quit smoking.   Approximate 5.5 cm right paratracheal mass FOR BIOPSY AS an outpatient on 07/03/18  Hyperlipidemia -LDL 108, goal less than 70, currently started on Lipitor 40 mg daily  Hypokalemia repleted K   Discharge Instructions  Discharge Instructions    Call MD for:  difficulty breathing, headache or  visual  disturbances   Complete by:  As directed    Call MD for:  persistant nausea and vomiting   Complete by:  As directed    Call MD for:  severe uncontrolled pain   Complete by:  As directed    Diet - low sodium heart healthy   Complete by:  As directed    Increase activity slowly   Complete by:  As directed      Allergies as of 06/27/2018   No Known Allergies     Medication List    STOP taking these medications   acetaminophen 650 MG CR tablet Commonly known as:  TYLENOL   clobetasol cream 0.05 % Commonly known as:  TEMOVATE     TAKE these medications   aspirin 325 MG EC tablet Take 1 tablet (325 mg total) by mouth daily. STOP 12/15 FOR BRONCH AND BIOPSY 12/18.Marland KitchenRESUME AFTER BIOPSY   atorvastatin 40 MG tablet Commonly known as:  LIPITOR Take 1 tablet (40 mg total) by mouth daily at 6 PM.   lisinopril-hydrochlorothiazide 20-25 MG tablet Commonly known as:  PRINZIDE,ZESTORETIC TAKE 1 TABLET BY MOUTH EVERY DAY *FOLLOW UP APPOINTMENT NEEDED*   oxyCODONE 5 MG immediate release tablet Commonly known as:  Oxy IR/ROXICODONE Take 1-2 tablets (5-10 mg total) by mouth every 4 (four) hours as needed for moderate pain.   pantoprazole 40 MG tablet Commonly known as:  PROTONIX Take 1 tablet (40 mg total) by mouth 2 (two) times daily before a meal.      Follow-up Information    Serafina Mitchell, MD In 2 weeks.   Specialties:  Vascular Surgery, Cardiology Why:  Office will call you to arrange your appt (sent) Contact information: [place] [address] [phone]        [name], [name], MD Follow up.   Specialty:  Family Medicine Contact information: [place] [address] [phone]        Collene Gobble, MD Follow up.   Specialty:  Pulmonary Disease Contact information: [place] [address] [phone]          No Known Allergies  Consultations:  Vascular surgery and  PCCM   Procedures/Studies: Ct Angio Head W Or Wo Contrast  Result Date: 06/22/2018 CLINICAL DATA:  Initial evaluation for acute stroke. EXAM: CT ANGIOGRAPHY HEAD AND NECK TECHNIQUE: Multidetector CT imaging of the head and neck was performed using the standard protocol during bolus administration of intravenous contrast. Multiplanar CT image reconstructions and MIPs were obtained to evaluate the vascular anatomy. Carotid stenosis measurements (when applicable) are obtained utilizing NASCET criteria, using the distal internal carotid diameter as the denominator. CONTRAST:  141mL ISOVUE-370 IOPAMIDOL (ISOVUE-370) INJECTION 76% COMPARISON:  Previous MRI from 06/21/2018 FINDINGS: CTA NECK FINDINGS Aortic arch: Visualized arch of normal caliber with normal 3 vessel morphology. Penetrating atheromatous plaques noted at the lateral and inferior aspect of the arch itself (series 7, image 329). Mixed plaque about the origin of the great vessels with up to 50% stenosis at the proximal right brachiocephalic artery. Diffuse narrowing of the proximal left subclavian artery of up to approximately 40%. Visualized subclavian arteries otherwise patent. Right carotid system: Right common carotid artery patent from its origin to the bifurcation. Mixed plaque with severe near occlusive stenosis at the proximal right ICA (series 7, image 203). A radiographic string sign is present. Stenosis begins at the bifurcation, and measures approximately 4-5 mm in length. Right ICA patent  distally to the skull base without additional stenosis or other abnormality. Left carotid system: Mixed plaque at the origin of the left common carotid artery with mild 25% narrowing. Left CCA otherwise patent distally to the bifurcation. Predominantly soft plaque at the left bifurcation/proximal left ICA with relatively mild no more than 20% stenosis. Left ICA widely patent distally to the skull base without additional stenosis or other abnormality.  Vertebral arteries: Both of the vertebral arteries arise from the subclavian arteries. Focal plaque at the origin of the left vertebral artery with severe high-grade stenosis (series 7, image 273). Focal tortuosity and plaque just distally within the pre foraminal left V1 segment with associated stenoses of up to 60-70%. Mild scattered plaque within the left V2 segment without significant stenosis. Dominant left vertebral artery otherwise patent to the skull base. Right vertebral artery patent from its origin to the skull base without stenosis or other abnormality. Skeleton: Kyphotic angulation of the cervical spine with spondylolysis at C5-6 and C6-7, better evaluated on recent MRI. No acute osseous abnormality. No discrete osseous lesions. Patient is edentulous. Other neck: No acute soft tissue abnormality within the neck. No adenopathy. Salivary glands normal. Thyroid within normal limits. Upper chest: Well-circumscribed right paratracheal mass measures approximately 4.4 x 5.5 cm (series 5, image 164), indeterminate. Additional subcarinal adenopathy measures up to approximately 19 mm, partially visualized. Remainder of the visualized upper chest demonstrates no acute finding. 1 cm pleural base nodular density present at the left lung apex (series 5, image 139). Visualized lungs are otherwise grossly clear. Underlying emphysematous changes noted. Review of the MIP images confirms the above findings CTA HEAD FINDINGS Anterior circulation: Petrous segments widely patent bilaterally. Scattered atheromatous plaque within the cavernous/supraclinoid ICAs with mild multifocal narrowing. ICA termini patent. Dominant left A1 widely patent. Tiny hypoplastic right A1, also patent. Normal anterior communicating artery. Dominant right ACA with hypoplastic left ACA, both of which patent to their distal aspects without stenosis. M1 segments mildly irregular but patent to their distal aspects without stenosis. Distal MCA branches  well perfused bilaterally. Distal small vessel atheromatous irregularity. Posterior circulation: Vertebral arteries patent to the vertebrobasilar junction without stenosis. Left vertebral artery dominant. Posterior inferior cerebral arteries patent bilaterally. Basilar widely patent to its distal aspect. Superior cerebral arteries patent bilaterally. Both of the posterior cerebral arteries primarily supplied via the basilar. PCAs patent to their distal aspects without high-grade stenosis. Distal small vessel atheromatous irregularity. Venous sinuses: Patent. Anatomic variants: Hypoplastic right A1 with dominant right A2. Delayed phase: No abnormal enhancement. Patchy right cerebral watershed type infarcts again noted. Review of the MIP images confirms the above findings IMPRESSION: 1. Severe near occlusive stenosis at the origin of the right ICA, almost certainly the causative agent for the previously identified right cerebral watershed type infarcts. 2. Severe high-grade stenosis at the origin of the left vertebral artery, with additional multifocal moderate to severe stenoses just distal within the left V1 segment. Remainder of the vertebrobasilar system patent without additional high-grade stenosis. 3. Prominent atherosclerotic change about the aortic arch. Associated 50% stenosis at the origin of the right brachiocephalic artery. 40% stenosis involving the proximal left subclavian artery. 4. Approximate 5.5 cm right paratracheal mass, indeterminate, but could reflect adenopathy or possibly a primary mediastinal lesion. Further assessment with dedicated cross-sectional imaging of the chest recommended. Electronically Signed   By: Jeannine Boga M.D.   On: 06/22/2018 02:50   Ct Head Wo Contrast  Result Date: 06/21/2018 CLINICAL DATA:  Generalized weakness and LEFT shoulder  pain from previous fall. Focal neuro deficit, greater than 6 hours. EXAM: CT HEAD WITHOUT CONTRAST TECHNIQUE: Contiguous axial images  were obtained from the base of the skull through the vertex without intravenous contrast. COMPARISON:  None. FINDINGS: Brain: Mild generalized parenchymal volume loss with commensurate dilatation of the ventricles and sulci. Patchy small low-density areas within the bilateral periventricular and subcortical white matter regions, compatible with chronic small vessel ischemia. No mass, hemorrhage, edema or other evidence of acute parenchymal abnormality. No extra-axial hemorrhage. Vascular: Chronic calcified atherosclerotic changes of the large vessels at the skull base. No unexpected hyperdense vessel. Skull: Normal. Negative for fracture or focal lesion. Sinuses/Orbits: No acute finding. Other: None. IMPRESSION: 1. No acute findings. No intracranial mass, hemorrhage or edema. 2. Mild chronic small vessel ischemic changes in the white matter. Electronically Signed   By: Franki Cabot M.D.   On: 06/21/2018 12:52   Ct Angio Neck W Or Wo Contrast  Result Date: 06/22/2018 CLINICAL DATA:  Initial evaluation for acute stroke. EXAM: CT ANGIOGRAPHY HEAD AND NECK TECHNIQUE: Multidetector CT imaging of the head and neck was performed using the standard protocol during bolus administration of intravenous contrast. Multiplanar CT image reconstructions and MIPs were obtained to evaluate the vascular anatomy. Carotid stenosis measurements (when applicable) are obtained utilizing NASCET criteria, using the distal internal carotid diameter as the denominator. CONTRAST:  166mL ISOVUE-370 IOPAMIDOL (ISOVUE-370) INJECTION 76% COMPARISON:  Previous MRI from 06/21/2018 FINDINGS: CTA NECK FINDINGS Aortic arch: Visualized arch of normal caliber with normal 3 vessel morphology. Penetrating atheromatous plaques noted at the lateral and inferior aspect of the arch itself (series 7, image 329). Mixed plaque about the origin of the great vessels with up to 50% stenosis at the proximal right brachiocephalic artery. Diffuse narrowing of the  proximal left subclavian artery of up to approximately 40%. Visualized subclavian arteries otherwise patent. Right carotid system: Right common carotid artery patent from its origin to the bifurcation. Mixed plaque with severe near occlusive stenosis at the proximal right ICA (series 7, image 203). A radiographic string sign is present. Stenosis begins at the bifurcation, and measures approximately 4-5 mm in length. Right ICA patent distally to the skull base without additional stenosis or other abnormality. Left carotid system: Mixed plaque at the origin of the left common carotid artery with mild 25% narrowing. Left CCA otherwise patent distally to the bifurcation. Predominantly soft plaque at the left bifurcation/proximal left ICA with relatively mild no more than 20% stenosis. Left ICA widely patent distally to the skull base without additional stenosis or other abnormality. Vertebral arteries: Both of the vertebral arteries arise from the subclavian arteries. Focal plaque at the origin of the left vertebral artery with severe high-grade stenosis (series 7, image 273). Focal tortuosity and plaque just distally within the pre foraminal left V1 segment with associated stenoses of up to 60-70%. Mild scattered plaque within the left V2 segment without significant stenosis. Dominant left vertebral artery otherwise patent to the skull base. Right vertebral artery patent from its origin to the skull base without stenosis or other abnormality. Skeleton: Kyphotic angulation of the cervical spine with spondylolysis at C5-6 and C6-7, better evaluated on recent MRI. No acute osseous abnormality. No discrete osseous lesions. Patient is edentulous. Other neck: No acute soft tissue abnormality within the neck. No adenopathy. Salivary glands normal. Thyroid within normal limits. Upper chest: Well-circumscribed right paratracheal mass measures approximately 4.4 x 5.5 cm (series 5, image 164), indeterminate. Additional subcarinal  adenopathy measures up to approximately  19 mm, partially visualized. Remainder of the visualized upper chest demonstrates no acute finding. 1 cm pleural base nodular density present at the left lung apex (series 5, image 139). Visualized lungs are otherwise grossly clear. Underlying emphysematous changes noted. Review of the MIP images confirms the above findings CTA HEAD FINDINGS Anterior circulation: Petrous segments widely patent bilaterally. Scattered atheromatous plaque within the cavernous/supraclinoid ICAs with mild multifocal narrowing. ICA termini patent. Dominant left A1 widely patent. Tiny hypoplastic right A1, also patent. Normal anterior communicating artery. Dominant right ACA with hypoplastic left ACA, both of which patent to their distal aspects without stenosis. M1 segments mildly irregular but patent to their distal aspects without stenosis. Distal MCA branches well perfused bilaterally. Distal small vessel atheromatous irregularity. Posterior circulation: Vertebral arteries patent to the vertebrobasilar junction without stenosis. Left vertebral artery dominant. Posterior inferior cerebral arteries patent bilaterally. Basilar widely patent to its distal aspect. Superior cerebral arteries patent bilaterally. Both of the posterior cerebral arteries primarily supplied via the basilar. PCAs patent to their distal aspects without high-grade stenosis. Distal small vessel atheromatous irregularity. Venous sinuses: Patent. Anatomic variants: Hypoplastic right A1 with dominant right A2. Delayed phase: No abnormal enhancement. Patchy right cerebral watershed type infarcts again noted. Review of the MIP images confirms the above findings IMPRESSION: 1. Severe near occlusive stenosis at the origin of the right ICA, almost certainly the causative agent for the previously identified right cerebral watershed type infarcts. 2. Severe high-grade stenosis at the origin of the left vertebral artery, with additional  multifocal moderate to severe stenoses just distal within the left V1 segment. Remainder of the vertebrobasilar system patent without additional high-grade stenosis. 3. Prominent atherosclerotic change about the aortic arch. Associated 50% stenosis at the origin of the right brachiocephalic artery. 40% stenosis involving the proximal left subclavian artery. 4. Approximate 5.5 cm right paratracheal mass, indeterminate, but could reflect adenopathy or possibly a primary mediastinal lesion. Further assessment with dedicated cross-sectional imaging of the chest recommended. Electronically Signed   By: Jeannine Boga M.D.   On: 06/22/2018 02:50   Ct Chest Wo Contrast  Result Date: 06/23/2018 CLINICAL DATA:  Evaluate chest mass seen on recent neck CT. EXAM: CT CHEST WITHOUT CONTRAST TECHNIQUE: Multidetector CT imaging of the chest was performed following the standard protocol without IV contrast. COMPARISON:  CT neck 06/22/2018 FINDINGS: Examination is quite limited due to motion. Cardiovascular: The heart is normal in size. No pericardial effusion. There is mild tortuosity, ectasia and calcification of the thoracic aorta along with a ductus diverticulum. No focal aneurysm. Dense three-vessel coronary artery calcifications are noted and calcifications noted at the aortic branch vessel ostia. Mediastinum/Nodes: Right hilar and mediastinal lymphadenopathy. 5.4 cm right paratracheal mass/adenopathy compressing and displacing the SVC. Smaller secondary mediastinal lymph nodes are noted more anteriorly. 2 cm subcarinal lymph node on image number 55. The esophagus is grossly normal. Lungs/Pleura: 4 cm right lower lobe lung mass most consistent with primary lung neoplasm. Suspect associated right hilar adenopathy but difficult to measure without IV contrast. No metastatic pulmonary nodules are identified. Emphysematous changes and mild pulmonary scarring. Upper Abdomen: No definite findings for hepatic or adrenal gland  metastasis. No upper abdominal adenopathy. Musculoskeletal: No definite lytic or sclerotic bone lesions to suggest metastatic disease. IMPRESSION: 1. 4 cm right lower lobe lung mass most consistent with primary lung neoplasm and associated right hilar and mediastinal metastatic adenopathy. 2. No findings for pulmonary metastatic disease or upper abdominal metastatic disease and no definite bone lesions. 3. PET-CT  and referral to multi-disciplinary thoracic clinic is suggested. Aortic Atherosclerosis (ICD10-I70.0) and Emphysema (ICD10-J43.9). Electronically Signed   By: Marijo Sanes M.D.   On: 06/23/2018 13:59   Mr Brain Wo Contrast (neuro Protocol)  Result Date: 06/21/2018 CLINICAL DATA:  Generalized weakness. Left shoulder pain. Previous fall. EXAM: MRI HEAD WITHOUT CONTRAST TECHNIQUE: Multiplanar, multiecho pulse sequences of the brain and surrounding structures were obtained without intravenous contrast. COMPARISON:  Head CT same day FINDINGS: Brain: Numerous small acute infarctions scattered throughout the right cerebral hemisphere in a pattern most consistent with watershed infarction. The differential diagnosis includes micro embolic infarctions. No large or confluent infarction. No swelling or hemorrhage. Elsewhere, the brainstem and cerebellum are normal. There are mild chronic small-vessel ischemic changes of the cerebral hemispheric white matter. No mass lesion, hydrocephalus or extra-axial collection. Vascular: Major vessels at the base of the brain show flow. Skull and upper cervical spine: Negative Sinuses/Orbits: Sinuses are clear. Small mastoid effusions. Orbits negative. Other: None IMPRESSION: Numerous small infarctions scattered within the right cerebral hemisphere, probably watershed distribution. No large or confluent infarction. The differential diagnosis does include multiple micro embolic infarctions, but the pattern is more consistent with watershed insult. Electronically Signed   By:  Nelson Chimes M.D.   On: 06/21/2018 17:51   Mr Cervical Spine Wo Contrast  Result Date: 06/21/2018 CLINICAL DATA:  Generalized weakness and shoulder pain. Recent fall. EXAM: MRI CERVICAL SPINE WITHOUT CONTRAST TECHNIQUE: Multiplanar, multisequence MR imaging of the cervical spine was performed. No intravenous contrast was administered. COMPARISON:  None. FINDINGS: Alignment: Kyphotic curvature in the midcervical region. Vertebrae: No visible acute fracture by MRI. Cord: No cord compression, primary cord lesion or evidence of cord injury. Posterior Fossa, vertebral arteries, paraspinal tissues: See results of brain MRI. Disc levels: Foramen magnum is widely patent. Ordinary mild osteoarthritis at the C1-2 articulation without encroachment upon the neural structures. C2-3: Chronic fusion.  Wide patency of the canal and foramina. C3-4: Facet arthropathy worse on the right than the left with right facet edema. No disc herniation. No central canal stenosis. Foraminal narrowing on the right that could affect the C4 nerve. C4-5: Mild bulging of the disc. Facet degeneration worse on the left. No central canal stenosis. Mild bilateral foraminal stenosis. C5-6: Spondylosis with endplate osteophytes and bulging of the disc. Narrowing of the ventral subarachnoid space but no compression of the cord. Foraminal stenosis right worse than left. Right C6 nerve could be affected. C6-7: Endplate osteophytes and bulging of the disc. No compressive canal stenosis. Foraminal stenosis left worse than right. Either C7 nerve could be affected, particularly the left. C7-T1: No disc abnormality. No stenosis. Mild facet osteoarthritis. Upper thoracic region appears unremarkable. IMPRESSION: No detectable acute or traumatic finding. Likely chronic kyphotic curvature in the cervical region. Chronic fusion at C2-3. Facet arthropathy at C3-4 worse on the right with edema that could be symptomatic. Right foraminal stenosis could affect the right  C4 nerve. C4-5 spondylosis and facet degeneration. Mild bilateral foraminal narrowing only. C5-6 spondylosis with foraminal narrowing right worse than left that could in particular affect the right C6 nerve. C6-7 spondylosis with foraminal narrowing left worse than right that could in particular affect the left C7 nerve. Electronically Signed   By: Nelson Chimes M.D.   On: 06/21/2018 17:49   Dg Shoulder Left  Result Date: 06/21/2018 CLINICAL DATA:  Shoulder pain from fall 1 week ago. EXAM: LEFT SHOULDER - 2+ VIEW COMPARISON:  None. FINDINGS: Osseous alignment is normal. No fracture line  or displaced fracture fragment seen. Mild degenerative spurring at the acromioclavicular joint space. Soft tissues about the LEFT shoulder are unremarkable. IMPRESSION: No acute findings. Mild degenerative change at the acromioclavicular joint space. Electronically Signed   By: Franki Cabot M.D.   On: 06/21/2018 10:10    (Echo, Carotid, EGD, Colonoscopy, ERCP)    Subjective: She is resting in bed in no acute distress husband by the bedside complains of incisional pain on the right side of the neck no other new complaints no nausea vomiting chest pain shortness of breath cough.  Feels like the left hand is getting stronger.  Discharge Exam: Vitals:   06/27/18 0053 06/27/18 0420  BP: 115/63 127/85  Pulse: 81 78  Resp: 14 18  Temp: 97.8 F (36.6 C) 97.9 F (36.6 C)  SpO2: 96% 99%   Vitals:   06/26/18 1421 06/26/18 2040 06/27/18 0053 06/27/18 0420  BP: 121/88 113/70 115/63 127/85  Pulse: 94 91 81 78  Resp: (!) 26 16 14 18   Temp: 98.1 F (36.7 C) 97.7 F (36.5 C) 97.8 F (36.6 C) 97.9 F (36.6 C)  TempSrc: Oral Oral Oral Oral  SpO2: 96% 97% 96% 99%  Weight:      Height:        General: Pt is alert, awake, not in acute distress Cardiovascular: RRR, S1/S2 +, no rubs, no gallops Respiratory: CTA bilaterally, no wheezing, no rhonchi Abdominal: Soft, NT, ND, bowel sounds + Extremities: no edema, no  cyanosis, left upper extremity weakness 3 x 5    The results of significant diagnostics from this hospitalization (including imaging, microbiology, ancillary and laboratory) are listed below for reference.     Microbiology: Recent Results (from the past 240 hour(s))  Surgical PCR screen     Status: Abnormal   Collection Time: 06/25/18  4:28 PM  Result Value Ref Range Status   MRSA, PCR NEGATIVE NEGATIVE Final   Staphylococcus aureus POSITIVE (A) NEGATIVE Final    Comment: (NOTE) The Xpert SA Assay (FDA approved for NASAL specimens in patients 70 years of age and older), is one component of a comprehensive surveillance program. It is not intended to diagnose infection nor to guide or monitor treatment.      Labs: BNP (last 3 results) No results for input(s): BNP in the last 8760 hours. Basic Metabolic Panel: Recent Labs  Lab 06/21/18 1213 06/22/18 0004 06/25/18 1107 06/26/18 0636 06/27/18 0232  NA 141 139 140 141 142  K 3.6 3.2* 3.8 4.0 3.6  CL 106 105 106 106 113*  CO2 26 26 24 27 22   GLUCOSE 106* 102* 101* 106* 117*  BUN 11 9 6* 6* 6*  CREATININE 0.76 0.88 0.81 0.93 0.77  CALCIUM 8.8* 8.4* 8.9 8.6* 7.0*  MG  --   --  1.9  --   --    Liver Function Tests: Recent Labs  Lab 06/21/18 1213 06/22/18 0004 06/25/18 1107  AST 18 15 15   ALT 15 13 14   ALKPHOS 45 46 49  BILITOT 0.6 0.5 0.7  PROT 5.9* 5.1* 5.8*  ALBUMIN 3.4* 2.8* 3.0*   No results for input(s): LIPASE, AMYLASE in the last 168 hours. No results for input(s): AMMONIA in the last 168 hours. CBC: Recent Labs  Lab 06/21/18 1213 06/22/18 0004 06/25/18 1107 06/26/18 0636 06/27/18 0422  WBC 9.3 8.4 7.6 6.8 10.7*  NEUTROABS 7.1  --   --   --   --   HGB 8.2* 7.5* 8.2* 8.6* 7.3*  HCT  26.3* 24.7* 27.3* 28.6* 24.9*  MCV 105.6* 103.8* 103.4* 104.0* 105.5*  PLT 211 226 349 364 324   Cardiac Enzymes: Recent Labs  Lab 06/21/18 1213  TROPONINI <0.03   BNP: Invalid input(s): POCBNP CBG: Recent Labs   Lab 06/21/18 1328 06/23/18 1119 06/23/18 1638  GLUCAP 92 93 140*   D-Dimer No results for input(s): DDIMER in the last 72 hours. Hgb A1c No results for input(s): HGBA1C in the last 72 hours. Lipid Profile No results for input(s): CHOL, HDL, LDLCALC, TRIG, CHOLHDL, LDLDIRECT in the last 72 hours. Thyroid function studies No results for input(s): TSH, T4TOTAL, T3FREE, THYROIDAB in the last 72 hours.  Invalid input(s): FREET3 Anemia work up No results for input(s): VITAMINB12, FOLATE, FERRITIN, TIBC, IRON, RETICCTPCT in the last 72 hours. Urinalysis    Component Value Date/Time   COLORURINE YELLOW 06/21/2018 1214   APPEARANCEUR CLEAR 06/21/2018 1214   LABSPEC 1.010 06/21/2018 1214   PHURINE 5.5 06/21/2018 1214   GLUCOSEU NEGATIVE 06/21/2018 1214   HGBUR NEGATIVE 06/21/2018 [place] 06/21/2018 1214   BILIRUBINUR negative 06/21/2018 1057   KETONESUR NEGATIVE 06/21/2018 1214   PROTEINUR NEGATIVE 06/21/2018 1214   UROBILINOGEN 0.2 06/21/2018 1057   NITRITE NEGATIVE 06/21/2018 1214   LEUKOCYTESUR NEGATIVE 06/21/2018 1214   Sepsis Labs Invalid input(s): PROCALCITONIN,  WBC,  LACTICIDVEN Microbiology Recent Results (from the past 240 hour(s))  Surgical PCR screen     Status: Abnormal   Collection Time: 06/25/18  4:28 PM  Result Value Ref Range Status   MRSA, PCR NEGATIVE NEGATIVE Final   Staphylococcus aureus POSITIVE (A) NEGATIVE Final    Comment: (NOTE) The Xpert SA Assay (FDA approved for NASAL specimens in patients 53 years of age and older), is one component of a comprehensive surveillance program. It is not intended to diagnose infection nor to guide or monitor treatment.      Time coordinating discharge: 35  minutes  SIGNED:   Georgette Shell, MD  [facility] 06/27/2018, 9:53 AM Pager   If 7PM-7AM, please contact night-coverage [email or web address] Password [email or web address]

## 2018-06-27 NOTE — Discharge Instructions (Signed)
   [facility]   Discharge Instructions   Carotid Endarterectomy (CEA)  Please refer to the following instructions for your post-procedure care. Your surgeon or physician assistant will discuss any changes with you.  Activity  You are encouraged to walk as much as you can. You can slowly return to normal activities but must avoid strenuous activity and heavy lifting until your doctor tell you it's OK. Avoid activities such as vacuuming or swinging a golf club. You can drive after one week if you are comfortable and you are no longer taking prescription pain medications. It is normal to feel tired for serval weeks after your surgery. It is also normal to have difficulty with sleep habits, eating, and bowel movements after surgery. These will go away with time.  Bathing/Showering  You may shower after you come home. Do not soak in a bathtub, hot tub, or swim until the incision heals completely.  Incision Care  Shower every day. Clean your incision with mild soap and water. Pat the area dry with a clean towel. You do not need a bandage unless otherwise instructed. Do not apply any ointments or creams to your incision. You may have skin glue on your incision. Do not peel it off. It will come off on its own in about one week. Your incision may feel thickened and raised for several weeks after your surgery. This is normal and the skin will soften over time. For Men Only: It's OK to shave around the incision but do not shave the incision itself for 2 weeks. It is common to have numbness under your chin that could last for several months.  Diet  Resume your normal diet. There are no special food restrictions following this procedure. A low fat/low cholesterol diet is recommended for all patients with vascular disease. In order to heal from your surgery, it is CRITICAL to get adequate nutrition. Your body requires vitamins, minerals, and protein. Vegetables are the best  source of vitamins and minerals. Vegetables also provide the perfect balance of protein. Processed food has little nutritional value, so try to avoid this.        Medications  Resume taking all of your medications unless your doctor or physician assistant tells you not to. If your incision is causing pain, you may take over-the- counter pain relievers such as acetaminophen (Tylenol). If you were prescribed a stronger pain medication, please be aware these medications can cause nausea and constipation. Prevent nausea by taking the medication with a snack or meal. Avoid constipation by drinking plenty of fluids and eating foods with a high amount of fiber, such as fruits, vegetables, and grains. Do not take Tylenol if you are taking prescription pain medications.  Follow Up  Our office will schedule a follow up appointment 2-3 weeks following discharge.  Please call us immediately for any of the following conditions  Increased pain, redness, drainage (pus) from your incision site. Fever of 101 degrees or higher. If you should develop stroke (slurred speech, difficulty swallowing, weakness on one side of your body, loss of vision) you should call 911 and go to the nearest emergency room.  Reduce your risk of vascular disease:  Stop smoking. If you would like help call [facility] at 1-800-QUIT-NOW (1-800-784-8669) or [place] at 336-586-4000. Manage your cholesterol Maintain a desired weight Control your diabetes Keep your blood pressure down  If you have any questions, please call the office at [phone].   

## 2018-06-27 NOTE — Progress Notes (Signed)
PCCM Interval Note  Ms. [name] is scheduled for bronchoscopy with endobronchial ultrasound, biopsies on 07/03/2018.  We can do this as an outpatient.  She will need to be off her aspirin 2 days prior.  She will be notified of other instructions by short stay.  Baltazar Apo, MD, PhD 06/27/2018, 3:58 PM [facility] [phone] or if no answer [phone]

## 2018-06-27 NOTE — Progress Notes (Addendum)
Physical Therapy Treatment Patient Details Name: [name] MRN: [id number] DOB: December 16, 1950 Today's Date: 06/27/2018    History of Present Illness [name] is an 67 y.o. female with PMH: hypertension, hyperlipidemia, obesity, psoriasis, recently discharged from [facility] for GI bleed and presented to PCP same day with L sided weakness that was apparently present on discharge. 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     PT Comments    Patient reassessed s/p R CEA on 12/11. Making excellent progress towards her physical therapy goals. Demonstrates good proximal LUE strength and continues with fine motor deficits and weaker handgrip. Pt and pt husband report feeling comfortable with therapeutic exercises for left hand strengthening/coordination. Pt ambulating 200 feet with no assistive device and supervision. Continue to recommend use of cane for unlevel surfaces. Planned discharge home today.     Follow Up Recommendations  Outpatient PT     Equipment Recommendations  Cane    Recommendations for Other Services       Precautions / Restrictions Precautions Precautions: Fall Precaution Comments: has back pain and R knee pain that affect her balance and function Restrictions Weight Bearing Restrictions: No    Mobility  Bed Mobility Overal bed mobility: Modified Independent             General bed mobility comments: Sitting in EOB upon arrival  Transfers Overall transfer level: Modified independent Equipment used: None Transfers: Sit to/from Stand Sit to Stand: Supervision;Min guard            Ambulation/Gait Ambulation/Gait assistance: Supervision Gait Distance (Feet): 200 Feet Assistive device: None Gait Pattern/deviations: Step-through pattern Gait velocity: decreased   General Gait Details: Cues for reciprocal arm swing, no overt LOB   Stairs             Wheelchair Mobility     Modified Rankin (Stroke Patients Only)       Balance Overall balance assessment: Needs assistance Sitting-balance support: No upper extremity supported Sitting balance-Leahy Scale: Good     Standing balance support: During functional activity;No upper extremity supported Standing balance-Leahy Scale: Good                              Cognition Arousal/Alertness: Awake/alert Behavior During Therapy: WFL for tasks assessed/performed Overall Cognitive Status: Within Functional Limits for tasks assessed                                        Exercises      General Comments        Pertinent Vitals/Pain Pain Assessment: Faces Pain Score: 5  Faces Pain Scale: Hurts little more Pain Location: surgical site Pain Descriptors / Indicators: Sore Pain Intervention(s): Monitored during session    Home Living                      Prior Function            PT Goals (current goals can now be found in the care plan section) Acute Rehab PT Goals Patient Stated Goal: to use my L hand normally PT Goal Formulation: With patient Time For Goal Achievement: 07/06/18 Potential to Achieve Goals: Good Progress towards PT goals: Progressing toward goals    Frequency    Min 4X/week      PT Plan  Current plan remains appropriate    Co-evaluation              AM-PAC PT "6 Clicks" Mobility   Outcome Measure  Help needed turning from your back to your side while in a flat bed without using bedrails?: None Help needed moving from lying on your back to sitting on the side of a flat bed without using bedrails?: None Help needed moving to and from a bed to a chair (including a wheelchair)?: None Help needed standing up from a chair using your arms (e.g., wheelchair or bedside chair)?: None Help needed to walk in hospital room?: None Help needed climbing 3-5 steps with a railing? : A Little 6 Click Score: 23    End of Session Equipment  Utilized During Treatment: Gait belt Activity Tolerance: Patient tolerated treatment well Patient left: in bed;with call bell/phone within reach;with family/visitor present   PT Visit Diagnosis: Unsteadiness on feet (R26.81);Pain     Time: 0160-1093 PT Time Calculation (min) (ACUTE ONLY): 14 min  Charges:  $Therapeutic Activity: 8-22 mins                    [name], PT, DPT Acute Rehabilitation Services Pager [phone] Office [phone]   [name] 06/27/2018, 1:25 PM

## 2018-06-27 NOTE — Progress Notes (Signed)
Occupational Therapy Treatment Patient Details Name: [name] MRN: [id number] DOB: 1951/04/17 Today's Date: 06/27/2018    History of present illness [name] is an 67 y.o. female with PMH: hypertension, hyperlipidemia, obesity, psoriasis, recently discharged from [facility] for GI bleed and presented to PCP same day with L sided weakness that was apparently present on discharge. 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   OT comments  Pt making good progress with functional goal and will have assist at home from significant other. Pt ready to d/c home this afternoon  Follow Up Recommendations  Outpatient OT;Supervision/Assistance - 24 hour    Equipment Recommendations  None recommended by OT    Recommendations for Other Services      Precautions / Restrictions Precautions Precautions: Fall Precaution Comments: has back pain and R knee pain that affect her balance and function Restrictions Weight Bearing Restrictions: No       Mobility Bed Mobility               General bed mobility comments: Sitting in EOB upon arrival  Transfers Overall transfer level: Needs assistance Equipment used: None Transfers: Sit to/from Stand Sit to Stand: Supervision;Min guard              Balance Overall balance assessment: Needs assistance Sitting-balance support: No upper extremity supported Sitting balance-Leahy Scale: Good     Standing balance support: During functional activity;No upper extremity supported Standing balance-Leahy Scale: Fair                             ADL either performed or assessed with clinical judgement   ADL Overall ADL's : Needs assistance/impaired Eating/Feeding: Independent;Sitting   Grooming: Supervision/safety;Wash/dry hands;Standing;Wash/dry face;With caregiver independent assisting   Upper Body Bathing: Set up;Supervision/ safety;With caregiver independent  assisting               Toilet Transfer: Ambulation;Supervision/safety   Toileting- Clothing Manipulation and Hygiene: Supervision/safety;Sit to/from stand   Tub/ Shower Transfer: Tub transfer;Ambulation;Shower seat;Supervision/safety;Grab bars   Functional mobility during ADLs: Rolling walker;Supervision/safety       Vision Patient Visual Report: No change from baseline     Perception     Praxis      Cognition Arousal/Alertness: Awake/alert Behavior During Therapy: WFL for tasks assessed/performed Overall Cognitive Status: Within Functional Limits for tasks assessed                                          Exercises     Shoulder Instructions       General Comments      Pertinent Vitals/ Pain       Pain Assessment: 0-10 Pain Score: 5  Pain Location: R neck and shoulder areas Pain Descriptors / Indicators: Sore;Aching Pain Intervention(s): Monitored during session  Home Living                                          Prior Functioning/Environment              Frequency           Progress Toward Goals  OT Goals(current goals can now be found in the care plan section)  Progress towards OT  goals: Progressing toward goals     Plan Discharge plan remains appropriate;Frequency remains appropriate    Co-evaluation                 AM-PAC OT "6 Clicks" Daily Activity     Outcome Measure   Help from another person eating meals?: None Help from another person taking care of personal grooming?: None Help from another person toileting, which includes using toliet, bedpan, or urinal?: None Help from another person bathing (including washing, rinsing, drying)?: A Little Help from another person to put on and taking off regular upper body clothing?: A Little Help from another person to put on and taking off regular lower body clothing?: A Little 6 Click Score: 21    End of Session Equipment Utilized During  Treatment: Gait belt  OT Visit Diagnosis: Unsteadiness on feet (R26.81);Other abnormalities of gait and mobility (R26.89);Muscle weakness (generalized) (M62.81);Hemiplegia and hemiparesis;Pain Hemiplegia - Right/Left: Left Hemiplegia - dominant/non-dominant: Non-Dominant Hemiplegia - caused by: Cerebral infarction Pain - Right/Left: Right Pain - part of body: Shoulder(neck)   Activity Tolerance Patient tolerated treatment well   Patient Left with call bell/phone within reach;with family/visitor present;in bed(sitting EOB)   Nurse Communication          Time: 8441-7127 OT Time Calculation (min): 13 min  Charges: OT General Charges $OT Visit: 1 Visit OT Treatments $Self Care/Home Management : 8-22 mins     Britt Bottom 06/27/2018, 1:09 PM

## 2018-06-27 NOTE — Evaluation (Deleted)
Physical Therapy Evaluation Patient Details Name: [name] MRN: [id number] DOB: 12-23-1950 Today's Date: 06/27/2018   History of Present Illness  [name] is an 67 y.o. female with PMH: hypertension, hyperlipidemia, obesity, psoriasis, recently discharged from [facility] for GI bleed and presented to PCP same day with L sided weakness that was apparently present on discharge. 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 S/p R CEA 12/11  Clinical Impression  Patient reassessed s/p R CEA on 12/11. Making excellent progress towards her physical therapy goals. Demonstrates good proximal LUE strength and continues with fine motor deficits and weaker handgrip. Pt and pt husband report feeling comfortable with therapeutic exercises for left hand strengthening/coordination. Pt ambulating 200 feet with no assistive device and supervision. Continue to recommend use of cane for unlevel surfaces. Planned discharge home today.    Follow Up Recommendations Outpatient PT    Equipment Recommendations  Cane    Recommendations for Other Services       Precautions / Restrictions Precautions Precautions: Fall Precaution Comments: has back pain and R knee pain that affect her balance and function Restrictions Weight Bearing Restrictions: No      Mobility  Bed Mobility Overal bed mobility: Modified Independent             General bed mobility comments: Sitting in EOB upon arrival  Transfers Overall transfer level: Modified independent Equipment used: None Transfers: Sit to/from Stand Sit to Stand: Supervision;Min guard            Ambulation/Gait Ambulation/Gait assistance: Supervision Gait Distance (Feet): 200 Feet Assistive device: None Gait Pattern/deviations: Step-through pattern Gait velocity: decreased   General Gait Details: Cues for reciprocal arm swing, no overt LOB  Stairs            Wheelchair  Mobility    Modified Rankin (Stroke Patients Only)       Balance Overall balance assessment: Needs assistance Sitting-balance support: No upper extremity supported Sitting balance-Leahy Scale: Good     Standing balance support: During functional activity;No upper extremity supported Standing balance-Leahy Scale: Good                               Pertinent Vitals/Pain Pain Assessment: Faces Pain Score: 5  Faces Pain Scale: Hurts little more Pain Location: surgical site Pain Descriptors / Indicators: Sore Pain Intervention(s): Monitored during session    Home Living                        Prior Function                 Hand Dominance        Extremity/Trunk Assessment                Communication      Cognition Arousal/Alertness: Awake/alert Behavior During Therapy: WFL for tasks assessed/performed Overall Cognitive Status: Within Functional Limits for tasks assessed                                        General Comments      Exercises     Assessment/Plan    PT Assessment    PT Problem List         PT Treatment Interventions      PT Goals (Current  goals can be found in the Care Plan section)  Acute Rehab PT Goals Patient Stated Goal: to use my L hand normally PT Goal Formulation: With patient Time For Goal Achievement: 07/06/18 Potential to Achieve Goals: Good    Frequency Min 4X/week   Barriers to discharge        Co-evaluation               AM-PAC PT "6 Clicks" Mobility  Outcome Measure Help needed turning from your back to your side while in a flat bed without using bedrails?: None Help needed moving from lying on your back to sitting on the side of a flat bed without using bedrails?: None Help needed moving to and from a bed to a chair (including a wheelchair)?: None Help needed standing up from a chair using your arms (e.g., wheelchair or bedside chair)?: None Help needed to  walk in hospital room?: None Help needed climbing 3-5 steps with a railing? : A Little 6 Click Score: 23    End of Session Equipment Utilized During Treatment: Gait belt Activity Tolerance: Patient tolerated treatment well Patient left: in bed;with call bell/phone within reach;with family/visitor present   PT Visit Diagnosis: Unsteadiness on feet (R26.81);Pain    Time: 2876-8115 PT Time Calculation (min) (ACUTE ONLY): 14 min   Charges:     PT Treatments $Therapeutic Activity: 8-22 mins        [name], PT, DPT Acute Rehabilitation Services Pager [phone] Office [phone]   [name] 06/27/2018, 1:21 PM

## 2018-06-27 NOTE — Care Management Note (Signed)
Case Management Note Marvetta Gibbons RN, BSN Transitions of Care Unit 4E- RN Case Manager [phone]  Patient Details  Name: [name] MRN: [id number] Date of Birth: 08-19-1950  Subjective/Objective:    Pt admitted with CVA s/p CEA on 12/11                Action/Plan: PTA pt lived at home, per PT/OT evals recommendation for outpt therapy- verbal order received from MD for outpt PT/OT - CM spoke with pt at bedside to offer output neuro rehab referral - however per pt she states that she does not feel like she will need it- she feels she is progressing well and will plan to do her own exercises at home based on conversation with therapist here. Pt declines referral to outpt neuro rehab. Pt states she plans to f/u with DR. Shaw, no further CM needs identified.   Expected Discharge Date:  06/27/18               Expected Discharge Plan:  OP Rehab  In-House Referral:  NA  Discharge planning Services  CM Consult  Post Acute Care Choice:  NA Choice offered to:  NA  DME Arranged:    DME Agency:     HH Arranged:    HH Agency:     Status of Service:  Completed, signed off  If discussed at [place] of Stay Meetings, dates discussed:    Discharge Disposition: home/self care   Additional Comments:  Dawayne Patricia, RN 06/27/2018, 11:49 AM

## 2018-06-27 NOTE — Progress Notes (Addendum)
[facility]  Subjective  - Doing very well. States her left UE weakness has improved.   Objective 127/85 78 97.9 F (36.6 C) (Oral) 18 99%  Intake/Output Summary (Last 24 hours) at 06/27/2018 0707 Last data filed at 06/27/2018 0545 Gross per 24 hour  Intake 2655.27 ml  Output 50 ml  Net 2605.27 ml    Moving all 4 ext. Weakness in grip left 4-/5  Smile is symmetric and no tongue deviation BP 127/85 controlled, Heart RRR Lungs non labored breathing  Assessment/Planning: POD #1  s/p  Right carotid endarterectomy with bovine patch angioplasty  HGB 7.3 50 cc blood loss intra op, recent history of GI bleed will allow IM to transfuse if they deem appropriate.   Ambulating asymptomatic denise dizziness or general weakness. F/U with Dr. Trula Slade in 2-3 weeks   Roxy Horseman 06/27/2018 7:07 AM --  Laboratory Lab Results: Recent Labs    06/26/18 0636 06/27/18 0422  WBC 6.8 10.7*  HGB 8.6* 7.3*  HCT 28.6* 24.9*  PLT 364 324   BMET Recent Labs    06/26/18 0636 06/27/18 0232  NA 141 142  K 4.0 3.6  CL 106 113*  CO2 27 22  GLUCOSE 106* 117*  BUN 6* 6*  CREATININE 0.93 0.77  CALCIUM 8.6* 7.0*    COAG Lab Results  Component Value Date   INR 1.01 06/26/2018   INR 0.90 06/21/2018   INR 1.01 06/19/2018   No results found for: PTT   Agree with the above Clear for  D./c  Nita Sickle

## 2018-06-27 NOTE — Progress Notes (Signed)
Pt educated and provided discharge instructions. Pt educated regarding warning signs of stoke. IV removed and intact. Telebox removed. CCMD notified. Pt denies any complaints. Pt tx via wheelchair to 4N to meet ride. [name]

## 2018-06-28 ENCOUNTER — Telehealth: Payer: Self-pay | Admitting: Family Medicine

## 2018-06-28 LAB — BPAM RBC
Blood Product Expiration Date: 201912282359
ISSUE DATE / TIME: 201912121219
Unit Type and Rh: 6200

## 2018-06-28 LAB — TYPE AND SCREEN
ABO/RH(D): A POS
Antibody Screen: NEGATIVE
Unit division: 0

## 2018-06-28 NOTE — Telephone Encounter (Signed)
Pt neck is extremely swollen and she is in pain and another doctor wants to do a lung biop. On wed dec 18.  Which requires her to have a light down her throat she is very concerned about this and needs someone to call her back about her concerns. She also wants to send a hugh thank you to Dr. Brigitte Pulse for what she done for her. She wants Dr. Brigitte Pulse to be her Primary doctor but she was advise that she is no longer taking new pt but she stated if Dr. Brigitte Pulse could consider her as a pt she would greatly appreciate it.

## 2018-07-01 ENCOUNTER — Telehealth: Payer: Self-pay | Admitting: Emergency Medicine

## 2018-07-01 NOTE — Telephone Encounter (Signed)
Attempted to call pt but unable to reach her. Left message for pt to return call. 

## 2018-07-01 NOTE — Telephone Encounter (Signed)
They patient is unable to do the lung biopsy on 07/03/18.She states that she will call back when she is ready to schedule. Sending to [place] as a fyi.  I have called Baxter Flattery and she stated she would cancel this apt. Nothing further needed at this time.

## 2018-07-01 NOTE — Telephone Encounter (Signed)
Patient returned call, CB is [phone].

## 2018-07-02 NOTE — Telephone Encounter (Signed)
I called to speak to her, figure out a time to reschedule. Left her a message on her machine. I asked her to get back on her ASA for now, reminded her that we would stop it 2 days before any planned procedure.

## 2018-07-03 ENCOUNTER — Encounter (HOSPITAL_COMMUNITY): Admission: RE | Payer: Self-pay | Source: Home / Self Care

## 2018-07-03 ENCOUNTER — Inpatient Hospital Stay (HOSPITAL_COMMUNITY): Admission: RE | Admit: 2018-07-03 | Payer: Medicare Other | Source: Home / Self Care | Admitting: Emergency Medicine

## 2018-07-03 SURGERY — VIDEO BRONCHOSCOPY WITH ENDOBRONCHIAL NAVIGATION
Anesthesia: Choice

## 2018-07-03 NOTE — Telephone Encounter (Signed)
Tried to call her again - left another message

## 2018-07-22 ENCOUNTER — Other Ambulatory Visit: Payer: Self-pay

## 2018-07-22 ENCOUNTER — Ambulatory Visit (INDEPENDENT_AMBULATORY_CARE_PROVIDER_SITE_OTHER): Payer: Self-pay | Admitting: Surgery

## 2018-07-22 ENCOUNTER — Encounter: Payer: Self-pay | Admitting: Surgery

## 2018-07-22 VITALS — BP 164/89 | HR 90 | Temp 98.7°F | Resp 20 | Ht 64.0 in | Wt 218.0 lb

## 2018-07-22 DIAGNOSIS — I6521 Occlusion and stenosis of right carotid artery: Secondary | ICD-10-CM

## 2018-07-22 NOTE — Progress Notes (Signed)
   Patient name: [name] MRN: [id number] DOB: 1950-09-15 Sex: female  REASON FOR VISIT:     post op  HISTORY OF PRESENT ILLNESS:   [name] is a 68 y.o. female who presented with a symptomatic right carotid stenosis.  She had a stroke and left sided weakness.  Her work up revealed right carotid stenosis.  ON 06-26-2018, she underwent right carotid endarterectomy.  She had a focal stenosis at he bifurcation of 80 %.  Her post-operative course was uncomplicated.  SHe did receive blood for a GI bleed which she had been having pre-op.  She was able to cook for the holidays.  Her left hand is much better.  She has yet to see pulmonary  CURRENT MEDICATIONS:    Current Outpatient Medications  Medication Sig Dispense Refill  . acetaminophen (TYLENOL) 650 MG CR tablet Take 650 mg by mouth every 8 (eight) hours as needed for pain.    Marland Kitchen aspirin EC 325 MG EC tablet Take 1 tablet (325 mg total) by mouth daily. STOP 12/15 FOR BRONCH AND BIOPSY 12/18.Marland KitchenRESUME AFTER BIOPSY 30 tablet 0  . atorvastatin (LIPITOR) 40 MG tablet Take 1 tablet (40 mg total) by mouth daily at 6 PM. 30 tablet 1  . Ferrous Gluconate-C-Folic Acid (IRON-C PO) Take 65 mg by mouth every other day.    . lisinopril-hydrochlorothiazide (PRINZIDE,ZESTORETIC) 20-25 MG tablet TAKE 1 TABLET BY MOUTH EVERY DAY *FOLLOW UP APPOINTMENT NEEDED* 90 tablet 0  . oxyCODONE (OXY IR/ROXICODONE) 5 MG immediate release tablet Take 1-2 tablets (5-10 mg total) by mouth every 4 (four) hours as needed for moderate pain. 30 tablet 0  . pantoprazole (PROTONIX) 40 MG tablet Take 1 tablet (40 mg total) by mouth 2 (two) times daily before a meal. 60 tablet 3   No current facility-administered medications for this visit.     REVIEW OF SYSTEMS:   [X]  denotes positive finding, [ ]  denotes negative finding Cardiac  Comments:  Chest pain or chest pressure:    Shortness of breath upon exertion:    Short of  breath when lying flat:    Irregular heart rhythm:    Constitutional    Fever or chills:      PHYSICAL EXAM:   Vitals:   07/22/18 1309  BP: (!) 164/89  Pulse: 90  Resp: 20  Temp: 98.7 F (37.1 C)  SpO2: 96%  Weight: 218 lb (98.9 kg)  Height: 5\' 4"  (1.626 m)    GENERAL: The patient is a well-nourished female, in no acute distress. The vital signs are documented above. CARDIOVASCULAR: There is a regular rate and rhythm. PULMONARY: Non-labored respirations Incision healing nicely  STUDIES:   none   MEDICAL ISSUES:   Carotid:  F/u 6 months with repeat duplex  Lung mass:  Encouraged her to get in touch with Dr. Lamonte Sakai, which she is planning on doing.  She is very tearful about this because of the experience she had with her mom 20 years ago  Annamarie Major, MD [facility] [phone] Pager [phone]

## 2018-07-22 NOTE — Telephone Encounter (Signed)
Attempted to call patient again, no answer but left a message on her machine.  Will continue to try to reconnect with her.  Likely need to get her in the office to discuss timing.  Alternatively we can try to just set up endobronchial ultrasound when she is available.

## 2018-07-23 ENCOUNTER — Telehealth: Payer: Self-pay | Admitting: Emergency Medicine

## 2018-07-23 NOTE — Telephone Encounter (Signed)
Called and spoke with patient in regards to phone note from [place] on 07/22/18. Patient stated that she will come in for an office visit to discuss this. Patient has been scheduled for 07/30/18 with RB. Will forward to him as an FYI.

## 2018-07-23 NOTE — Telephone Encounter (Signed)
Thank you :)

## 2018-07-28 ENCOUNTER — Other Ambulatory Visit (HOSPITAL_COMMUNITY): Payer: Self-pay | Admitting: Family Medicine

## 2018-07-30 ENCOUNTER — Encounter: Payer: Self-pay | Admitting: Emergency Medicine

## 2018-07-30 ENCOUNTER — Telehealth: Payer: Self-pay | Admitting: Family Medicine

## 2018-07-30 ENCOUNTER — Ambulatory Visit: Payer: Medicare Other | Admitting: Emergency Medicine

## 2018-07-30 VITALS — HR 103 | Ht 64.0 in | Wt 210.0 lb

## 2018-07-30 DIAGNOSIS — R918 Other nonspecific abnormal finding of lung field: Secondary | ICD-10-CM | POA: Diagnosis not present

## 2018-07-30 DIAGNOSIS — I10 Essential (primary) hypertension: Secondary | ICD-10-CM | POA: Diagnosis not present

## 2018-07-30 NOTE — Patient Instructions (Signed)
We will arrange for a bronchoscopy to evaluate your right lung abnormalities.  This will be done by under general anesthesia at [place] will need to stop your aspirin 3 days before our planned procedure date. We will repeat your CT scan of the chest before the procedure Congratulations on quitting smoking.  We will try to help you to ensure that you do not restart We will consider performing pulmonary function testing at some point in the future Follow with Dr Lamonte Sakai in 1 month

## 2018-07-30 NOTE — Telephone Encounter (Signed)
Copied from [place] [id number]. Topic: General - Other >> Jul 30, 2018  1:50 PM Lennox Solders wrote: Reason for CRM: pt is calling and would not elaborate the reason for the call. Pt is requesting dr Brigitte Pulse nurse to call her back. Pt is aware dr Brigitte Pulse not in office

## 2018-07-30 NOTE — Assessment & Plan Note (Signed)
Right mediastinal/hilar opacity with an associated right lower lobe irregularly-shaped mass.  Suspicious for malignancy.  I think she needs endobronchial ultrasound and navigation.  Discussed with her today.  We will try to arrange for soon as possible.  She will need a repeat super D CT scan of the chest.  She is on aspirin for her recent stroke and we will stop this 3 days prior.  Also note that she is significantly hypertensive today, possibly due to the stress of this medical problem in this office visit.  We will follow closely and try to manage more aggressively before we take her to the operating room.

## 2018-07-30 NOTE — Telephone Encounter (Signed)
Spoke with pt and informed her that I would send this message to scheduling to make an appointment before 1/29 for HTN follow-up.

## 2018-07-30 NOTE — Assessment & Plan Note (Signed)
Following with vascular surgery, currently on aspirin

## 2018-07-30 NOTE — H&P (View-Only) (Signed)
Subjective:    Patient ID: [name], female    DOB: 1951-01-01, 68 y.o.   MRN: [id number]  HPI 68 year old female with a past medical history of hypertension, tobacco abuse (quit 06/19/18), pack to pack and a half for approximately 10 years, presented with acute GI bleeding status post EGD which revealed multiple ulcers in the duodenum with history of Goody powder use.  She ultimately came back to the ER with complaints of left arm weakness and inability to lift the left wrist.  MRI of the brain and C-spine was done which showed multiple infarcts within the watershed area of the right cerebrum.  CTA of head and neck revealed a large 5.5 cm paratracheal mass. I saw her during that hospitalization to talk about the options for diagnosis. We initially planned for EBUS last month, postponed so she could recover some from the CVA.   She has made a lot of progress, has built up a lot of strength on the L. She is doing housework. She is having a lot of back and leg pain. She has a lot of questions about the procedure, all appropriate regarding risks / benefits.     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   Review of Systems  Past Medical History:  Diagnosis Date  . Chronic pain   . Hyperlipidemia   . Hypertension      Family History  Problem Relation Age of Onset  . Cancer Mother        lung cancer  . Diabetes Other   . Hyperlipidemia Other   . Hypertension Other   . Cancer Other      Social History   Socioeconomic History  . Marital status: Single    Spouse name: Not on file  . Number of children: 0  . Years of education: Not on file  . Highest education level: Not on file  Occupational History  . Not on file  Social Needs  . Financial resource strain: Not on file  . Food insecurity:    Worry: Not on file    Inability: Not on file  . Transportation needs:    Medical: Not on file    Non-medical: Not on file  Tobacco  Use  . Smoking status: Former Smoker    Packs/day: 1.00    Years: 10.00    Pack years: 10.00    Types: Cigarettes    Last attempt to quit: 06/19/2018    Years since quitting: 0.1  . Smokeless tobacco: Never Used  . Tobacco comment: Currently using Nicorette Gum  Substance and Sexual Activity  . Alcohol use: Yes    Comment: wine 2 glasses a day   . Drug use: Never    Comment: Hemp Oil  . Sexual activity: Not Currently  Lifestyle  . Physical activity:    Days per week: Not on file    Minutes per session: Not on file  . Stress: Not on file  Relationships  . Social connections:    Talks on phone: Not on file    Gets together: Not on file    Attends religious service: Not on file    Active member of club or organization: Not on file    Attends meetings of clubs or organizations: Not on file    Relationship status: Not on file  . Intimate partner violence:    Fear of current or ex partner: Not on file  Emotionally abused: Not on file    Physically abused: Not on file    Forced sexual activity: Not on file  Other Topics Concern  . 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 acetaminophen (TYLENOL) 650 MG CR tablet Take 650 mg by mouth every 8 (eight) hours as needed for pain.    Marland Kitchen aspirin EC 325 MG EC tablet Take 1 tablet (325 mg total) by mouth daily. STOP 12/15 FOR BRONCH AND BIOPSY 12/18.Marland KitchenRESUME AFTER BIOPSY 30 tablet 0  . atorvastatin (LIPITOR) 40 MG tablet Take 1 tablet (40 mg total) by mouth daily at 6 PM. 30 tablet 1  . Ferrous Gluconate-C-Folic Acid (IRON-C PO) Take 65 mg by mouth every other day.    . lisinopril-hydrochlorothiazide (PRINZIDE,ZESTORETIC) 20-25 MG tablet TAKE 1 TABLET BY MOUTH EVERY DAY *FOLLOW UP APPOINTMENT NEEDED* 90 tablet 0  . oxyCODONE (OXY  IR/ROXICODONE) 5 MG immediate release tablet Take 1-2 tablets (5-10 mg total) by mouth every 4 (four) hours as needed for moderate pain. 30 tablet 0  . pantoprazole (PROTONIX) 40 MG tablet Take 1 tablet (40 mg total) by mouth 2 (two) times daily before a meal. 60 tablet 3   No facility-administered medications prior to visit.         Objective:   Physical Exam  Vitals:   07/30/18 1014  Pulse: (!) 103  SpO2: 96%  Weight: 210 lb (95.3 kg)  Height: 5\' 4"  (1.626 m)   Gen: Pleasant, well-nourished, in no distress,  normal affect  ENT: No lesions,  mouth clear,  oropharynx clear, no postnasal drip  Neck: No JVD, no stridor  Lungs: No use of accessory muscles, no crackles or wheezing on normal respiration, no wheeze on forced expiration  Cardiovascular: RRR, heart sounds normal, no murmur or gallops, no peripheral edema  Musculoskeletal: No deformities, no cyanosis or clubbing  Neuro: alert, awake, good strength on the left and the right  Skin: Warm, no lesions or rashes   CT chest 06/23/18 --  IMPRESSION: 1. 4 cm right lower lobe lung mass most consistent with primary lung neoplasm and associated right hilar and mediastinal metastatic adenopathy. 2. No findings for pulmonary metastatic disease or upper abdominal metastatic disease and no definite bone lesions. 3. PET-CT and referral to multi-disciplinary thoracic clinic is suggested.       Assessment & Plan:  Right lower lobe lung mass Right mediastinal/hilar opacity with an associated right lower lobe irregularly-shaped mass.  Suspicious for malignancy.  I think she needs endobronchial ultrasound and navigation.  Discussed with her today.  We will try to arrange for soon as possible.  She will need a repeat super D CT scan of the chest.  She is on aspirin for her recent stroke and we will stop this 3 days prior.  Also note that she is significantly hypertensive today, possibly due to the stress of this medical problem in  this office visit.  We will follow closely and try to manage more aggressively before we take her to the operating room.  Acute CVA (cerebrovascular accident) [facility]) Following with vascular surgery, currently on aspirin  Essential hypertension Continue her same medical management but we may need to adjust prior  to procedure if remains elevated  Baltazar Apo, MD, PhD 07/30/2018, 10:54 AM [facility] [phone] or if no answer [phone]

## 2018-07-30 NOTE — Assessment & Plan Note (Signed)
Continue her same medical management but we may need to adjust prior to procedure if remains elevated

## 2018-07-30 NOTE — Progress Notes (Signed)
Subjective:    Patient ID: [name], female    DOB: 1951-05-10, 68 y.o.   MRN: [id number]  HPI 68 year old female with a past medical history of hypertension, tobacco abuse (quit 06/19/18), pack to pack and a half for approximately 10 years, presented with acute GI bleeding status post EGD which revealed multiple ulcers in the duodenum with history of Goody powder use.  She ultimately came back to the ER with complaints of left arm weakness and inability to lift the left wrist.  MRI of the brain and C-spine was done which showed multiple infarcts within the watershed area of the right cerebrum.  CTA of head and neck revealed a large 5.5 cm paratracheal mass. I saw her during that hospitalization to talk about the options for diagnosis. We initially planned for EBUS last month, postponed so she could recover some from the CVA.   She has made a lot of progress, has built up a lot of strength on the L. She is doing housework. She is having a lot of back and leg pain. She has a lot of questions about the procedure, all appropriate regarding risks / benefits.     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   Review of Systems  Past Medical History:  Diagnosis Date  . Chronic pain   . Hyperlipidemia   . Hypertension      Family History  Problem Relation Age of Onset  . Cancer Mother        lung cancer  . Diabetes Other   . Hyperlipidemia Other   . Hypertension Other   . Cancer Other      Social History   Socioeconomic History  . Marital status: Single    Spouse name: Not on file  . Number of children: 0  . Years of education: Not on file  . Highest education level: Not on file  Occupational History  . Not on file  Social Needs  . Financial resource strain: Not on file  . Food insecurity:    Worry: Not on file    Inability: Not on file  . Transportation needs:    Medical: Not on file    Non-medical: Not on file  Tobacco  Use  . Smoking status: Former Smoker    Packs/day: 1.00    Years: 10.00    Pack years: 10.00    Types: Cigarettes    Last attempt to quit: 06/19/2018    Years since quitting: 0.1  . Smokeless tobacco: Never Used  . Tobacco comment: Currently using Nicorette Gum  Substance and Sexual Activity  . Alcohol use: Yes    Comment: wine 2 glasses a day   . Drug use: Never    Comment: Hemp Oil  . Sexual activity: Not Currently  Lifestyle  . Physical activity:    Days per week: Not on file    Minutes per session: Not on file  . Stress: Not on file  Relationships  . Social connections:    Talks on phone: Not on file    Gets together: Not on file    Attends religious service: Not on file    Active member of club or organization: Not on file    Attends meetings of clubs or organizations: Not on file    Relationship status: Not on file  . Intimate partner violence:    Fear of current or ex partner: Not on file  Emotionally abused: Not on file    Physically abused: Not on file    Forced sexual activity: Not on file  Other Topics Concern  . 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 acetaminophen (TYLENOL) 650 MG CR tablet Take 650 mg by mouth every 8 (eight) hours as needed for pain.    Marland Kitchen aspirin EC 325 MG EC tablet Take 1 tablet (325 mg total) by mouth daily. STOP 12/15 FOR BRONCH AND BIOPSY 12/18.Marland KitchenRESUME AFTER BIOPSY 30 tablet 0  . atorvastatin (LIPITOR) 40 MG tablet Take 1 tablet (40 mg total) by mouth daily at 6 PM. 30 tablet 1  . Ferrous Gluconate-C-Folic Acid (IRON-C PO) Take 65 mg by mouth every other day.    . lisinopril-hydrochlorothiazide (PRINZIDE,ZESTORETIC) 20-25 MG tablet TAKE 1 TABLET BY MOUTH EVERY DAY *FOLLOW UP APPOINTMENT NEEDED* 90 tablet 0  . oxyCODONE (OXY  IR/ROXICODONE) 5 MG immediate release tablet Take 1-2 tablets (5-10 mg total) by mouth every 4 (four) hours as needed for moderate pain. 30 tablet 0  . pantoprazole (PROTONIX) 40 MG tablet Take 1 tablet (40 mg total) by mouth 2 (two) times daily before a meal. 60 tablet 3   No facility-administered medications prior to visit.         Objective:   Physical Exam  Vitals:   07/30/18 1014  Pulse: (!) 103  SpO2: 96%  Weight: 210 lb (95.3 kg)  Height: 5\' 4"  (1.626 m)   Gen: Pleasant, well-nourished, in no distress,  normal affect  ENT: No lesions,  mouth clear,  oropharynx clear, no postnasal drip  Neck: No JVD, no stridor  Lungs: No use of accessory muscles, no crackles or wheezing on normal respiration, no wheeze on forced expiration  Cardiovascular: RRR, heart sounds normal, no murmur or gallops, no peripheral edema  Musculoskeletal: No deformities, no cyanosis or clubbing  Neuro: alert, awake, good strength on the left and the right  Skin: Warm, no lesions or rashes   CT chest 06/23/18 --  IMPRESSION: 1. 4 cm right lower lobe lung mass most consistent with primary lung neoplasm and associated right hilar and mediastinal metastatic adenopathy. 2. No findings for pulmonary metastatic disease or upper abdominal metastatic disease and no definite bone lesions. 3. PET-CT and referral to multi-disciplinary thoracic clinic is suggested.       Assessment & Plan:  Right lower lobe lung mass Right mediastinal/hilar opacity with an associated right lower lobe irregularly-shaped mass.  Suspicious for malignancy.  I think she needs endobronchial ultrasound and navigation.  Discussed with her today.  We will try to arrange for soon as possible.  She will need a repeat super D CT scan of the chest.  She is on aspirin for her recent stroke and we will stop this 3 days prior.  Also note that she is significantly hypertensive today, possibly due to the stress of this medical problem in  this office visit.  We will follow closely and try to manage more aggressively before we take her to the operating room.  Acute CVA (cerebrovascular accident) [facility]) Following with vascular surgery, currently on aspirin  Essential hypertension Continue her same medical management but we may need to adjust prior  to procedure if remains elevated  Baltazar Apo, MD, PhD 07/30/2018, 10:54 AM [facility] [phone] or if no answer [phone]

## 2018-08-02 ENCOUNTER — Other Ambulatory Visit: Payer: Self-pay

## 2018-08-02 ENCOUNTER — Encounter: Payer: Self-pay | Admitting: Family Medicine

## 2018-08-02 ENCOUNTER — Ambulatory Visit: Payer: Medicare Other | Admitting: Family Medicine

## 2018-08-02 VITALS — BP 165/90 | HR 90 | Temp 99.1°F | Resp 18 | Ht 64.0 in | Wt 207.8 lb

## 2018-08-02 DIAGNOSIS — I10 Essential (primary) hypertension: Secondary | ICD-10-CM | POA: Diagnosis not present

## 2018-08-02 DIAGNOSIS — Z1211 Encounter for screening for malignant neoplasm of colon: Secondary | ICD-10-CM

## 2018-08-02 DIAGNOSIS — Z862 Personal history of diseases of the blood and blood-forming organs and certain disorders involving the immune mechanism: Secondary | ICD-10-CM | POA: Diagnosis not present

## 2018-08-02 DIAGNOSIS — E785 Hyperlipidemia, unspecified: Secondary | ICD-10-CM

## 2018-08-02 DIAGNOSIS — R918 Other nonspecific abnormal finding of lung field: Secondary | ICD-10-CM | POA: Diagnosis not present

## 2018-08-02 DIAGNOSIS — Z1159 Encounter for screening for other viral diseases: Secondary | ICD-10-CM

## 2018-08-02 MED ORDER — CHLORTHALIDONE 25 MG PO TABS: 25.0000 mg | ORAL_TABLET | Freq: Every day | ORAL | 0 refills | Status: DC

## 2018-08-02 MED ORDER — LISINOPRIL 40 MG PO TABS: 40.0000 mg | ORAL_TABLET | Freq: Every day | ORAL | 0 refills | Status: DC

## 2018-08-02 NOTE — Progress Notes (Signed)
1/17/202010:16 AM  [name] [birth year], 68 y.o. female [id number]  Chief Complaint  Patient presents with  . Hypertension    follow up wants to see if she needs to up her dose. Declines pap or any injections.    HPI:   Patient is a 68 y.o. female with past medical history significant for HTN, CVA in 06/2018, UGIB with h/o transfusion, HLP, former smoker who presents today for BP check  PCP Dr Brigitte Pulse Will be having video bronch later this month for RLL mass  At her most recent visit with pulm found to have high BP Recently restarted on BP meds after CVA in dec 2019, severe high grade stenosis of R ICA and L vet artery  Denies any chest pain, palpitation, SOB, vision changes No fever, chills, night sweats No changes in diet, no intentional weight loss Denies any hemoptysis, abd pain, nausea, vomiting, reflux, black tarry stools Has chronic shoulder, knee and back pain  Has transportation issues but able to check BP at home  Fall Risk  08/02/2018 06/21/2018  Falls in the past year? 1 0  Number falls in past yr: 0 -  Injury with Fall? 0 -     Depression screen PHQ 2/9 06/21/2018  Decreased Interest 0  Down, Depressed, Hopeless 0  PHQ - 2 Score 0    No Known Allergies  Prior to Admission medications   Medication Sig Start Date End Date Taking? Authorizing Provider  acetaminophen (TYLENOL) 650 MG CR tablet Take 650 mg by mouth every 8 (eight) hours as needed for pain.   Yes [provider]  aspirin EC 325 MG EC tablet Take 1 tablet (325 mg total) by mouth daily. STOP 12/15 FOR BRONCH AND BIOPSY 12/18.Marland KitchenRESUME AFTER BIOPSY 06/27/18  Yes Georgette Shell, MD  atorvastatin (LIPITOR) 40 MG tablet Take 1 tablet (40 mg total) by mouth daily at 6 PM. 06/27/18  Yes Georgette Shell, MD  Ferrous Gluconate-C-Folic Acid (IRON-C PO) Take 65 mg by mouth every other day.   Yes [provider]  lisinopril-hydrochlorothiazide (PRINZIDE,ZESTORETIC) 20-25 MG  tablet TAKE 1 TABLET BY MOUTH EVERY DAY *FOLLOW UP APPOINTMENT NEEDED* 11/05/17  Yes [name], [name], MD  oxyCODONE (OXY IR/ROXICODONE) 5 MG immediate release tablet Take 1-2 tablets (5-10 mg total) by mouth every 4 (four) hours as needed for moderate pain. 06/27/18  Yes Georgette Shell, MD  pantoprazole (PROTONIX) 40 MG tablet Take 1 tablet (40 mg total) by mouth 2 (two) times daily before a meal. 06/20/18  Yes Danford, Suann Larry, MD    Past Medical History:  Diagnosis Date  . Chronic pain   . Hyperlipidemia   . Hypertension     Past Surgical History:  Procedure Laterality Date  . BIOPSY  06/19/2018   Procedure: BIOPSY;  Surgeon: Laurence Spates, MD;  Location: [facility];  Service: Endoscopy;;  . ENDARTERECTOMY Right 06/26/2018   Procedure: ENDARTERECTOMY CAROTID RIGHT;  Surgeon: Serafina Mitchell, MD;  Location: [facility];  Service: Vascular;  Laterality: Right;  . ESOPHAGOGASTRODUODENOSCOPY (EGD) WITH PROPOFOL N/A 06/19/2018   Procedure: ESOPHAGOGASTRODUODENOSCOPY (EGD) WITH PROPOFOL;  Surgeon: Laurence Spates, MD;  Location: [facility];  Service: Endoscopy;  Laterality: N/A;  . PATCH ANGIOPLASTY Right 06/26/2018   Procedure: PATCH ANGIOPLASTY USING A XENOSURE 1cm x 6cm BIOLOGIC PATCH;  Surgeon: Serafina Mitchell, MD;  Location: [facility] OR;  Service: Vascular;  Laterality: Right;    Social History   Tobacco Use  . Smoking status: Former Smoker  Packs/day: 1.00    Years: 10.00    Pack years: 10.00    Types: Cigarettes    Last attempt to quit: 06/19/2018    Years since quitting: 0.1  . Smokeless tobacco: Never Used  . Tobacco comment: Currently using Nicorette Gum  Substance Use Topics  . Alcohol use: Yes    Comment: wine 2 glasses a day     Family History  Problem Relation Age of Onset  . Cancer Mother        lung cancer  . Diabetes Other   . Hyperlipidemia Other   . Hypertension Other   . Cancer Other     ROS Per hpi  OBJECTIVE:  Blood pressure (!) 165/90,  pulse 90, temperature 99.1 F (37.3 C), temperature source Oral, resp. rate 18, height 5\' 4"  (1.626 m), weight 207 lb 12.8 oz (94.3 kg), SpO2 95 %. Body mass index is 35.67 kg/m.   Wt Readings from Last 3 Encounters:  08/02/18 207 lb 12.8 oz (94.3 kg)  07/30/18 210 lb (95.3 kg)  07/22/18 218 lb (98.9 kg)    BP Readings from Last 3 Encounters:  08/02/18 (!) 165/90  07/22/18 (!) 164/89  06/27/18 (!) 154/92    Physical Exam Vitals signs and nursing note reviewed.  Constitutional:      Appearance: She is well-developed.  HENT:     Head: Normocephalic and atraumatic.     Mouth/Throat:     Pharynx: No oropharyngeal exudate.  Eyes:     General: No scleral icterus.    Conjunctiva/sclera: Conjunctivae normal.     Pupils: Pupils are equal, round, and reactive to light.  Neck:     Musculoskeletal: Neck supple.  Cardiovascular:     Rate and Rhythm: Normal rate and regular rhythm.     Heart sounds: Normal heart sounds. No murmur. No friction rub. No gallop.   Pulmonary:     Effort: Pulmonary effort is normal.     Breath sounds: Normal breath sounds. No wheezing or rales.  Skin:    General: Skin is warm and dry.  Neurological:     Mental Status: She is alert and oriented to person, place, and time.     ASSESSMENT and PLAN  1. Essential hypertension Uncontrolled. Adjusting meds. Reviewed r/se/b. Discussed home BP monitoring - Comprehensive metabolic panel - TSH  2. Right lower lobe lung mass Pending bronch  3. Hyperlipidemia, unspecified hyperlipidemia type Checking labs today, medications will be adjusted as needed. Goal LDL < 70 - Comprehensive metabolic panel - Lipid panel - TSH  4. H/O iron deficiency anemia - CBC  5. Screen for colon cancer - Cologuard  6. Encounter for hepatitis C screening test for low risk patient - HCV Ab w Reflex to Quant PCR  Other orders - lisinopril (PRINIVIL,ZESTRIL) 40 MG tablet; Take 1 tablet (40 mg total) by mouth daily. -  chlorthalidone (HYGROTON) 25 MG tablet; Take 1 tablet (25 mg total) by mouth daily.  Return in about 6 weeks (around 09/13/2018) for BP and after pulm.    Rutherford Guys, MD Primary Care at [place], [place] [address] Ph.  [phone] Fax [phone]

## 2018-08-02 NOTE — Patient Instructions (Addendum)
  Check blood pressure daily, goal is < 140/90. If not at goal in a week, then please come back for adjustment of medication   If you have lab work done today you will be contacted with your lab results within the next 2 weeks.  If you have not heard from [place] then please contact us. The fastest way to get your results is to register for My Chart.   IF you received an x-ray today, you will receive an invoice from [facility]. Please contact [facility]iology at [phone] with questions or concerns regarding your invoice.   IF you received labwork today, you will receive an invoice from [facility]. Please contact [facility] at [phone] with questions or concerns regarding your invoice.   Our billing staff will not be able to assist you with questions regarding bills from these companies.  You will be contacted with the lab results as soon as they are available. The fastest way to get your results is to activate your My Chart account. Instructions are located on the last page of this paperwork. If you have not heard from [place] regarding the results in 2 weeks, please contact this office.

## 2018-08-03 LAB — LIPID PANEL
Chol/HDL Ratio: 4 ratio (ref 0.0–4.4)
Cholesterol, Total: 181 mg/dL (ref 100–199)
HDL: 45 mg/dL (ref >39)
LDL Calculated: 109 mg/dL — ABNORMAL HIGH (ref 0–99)
Triglycerides: 137 mg/dL (ref 0–149)
VLDL Cholesterol Cal: 27 mg/dL (ref 5–40)

## 2018-08-03 LAB — COMPREHENSIVE METABOLIC PANEL
ALT: 11 IU/L (ref 0–32)
AST: 13 IU/L (ref 0–40)
Albumin/Globulin Ratio: 1.5 (ref 1.2–2.2)
Albumin: 4 g/dL (ref 3.6–4.8)
Alkaline Phosphatase: 86 IU/L (ref 39–117)
BUN/Creatinine Ratio: 11 — ABNORMAL LOW (ref 12–28)
BUN: 9 mg/dL (ref 8–27)
Bilirubin Total: 0.4 mg/dL (ref 0.0–1.2)
CO2: 24 mmol/L (ref 20–29)
Calcium: 9.5 mg/dL (ref 8.7–10.3)
Chloride: 99 mmol/L (ref 96–106)
Creatinine, Ser: 0.81 mg/dL (ref 0.57–1.00)
GFR calc Af Amer: 88 mL/min/{1.73_m2} (ref >59)
GFR calc non Af Amer: 76 mL/min/{1.73_m2} (ref >59)
Globulin, Total: 2.7 g/dL (ref 1.5–4.5)
Glucose: 116 mg/dL — ABNORMAL HIGH (ref 65–99)
Potassium: 4.4 mmol/L (ref 3.5–5.2)
Sodium: 142 mmol/L (ref 134–144)
Total Protein: 6.7 g/dL (ref 6.0–8.5)

## 2018-08-03 LAB — CBC
Hematocrit: 39.6 % (ref 34.0–46.6)
Hemoglobin: 12.2 g/dL (ref 11.1–15.9)
MCH: 28 pg (ref 26.6–33.0)
MCHC: 30.8 g/dL — ABNORMAL LOW (ref 31.5–35.7)
MCV: 91 fL (ref 79–97)
Platelets: 350 10*3/uL (ref 150–450)
RBC: 4.36 x10E6/uL (ref 3.77–5.28)
RDW: 14.3 % (ref 11.7–15.4)
WBC: 7.4 10*3/uL (ref 3.4–10.8)

## 2018-08-03 LAB — HCV INTERPRETATION

## 2018-08-03 LAB — HCV AB W REFLEX TO QUANT PCR: HCV Ab: 0.1 s/co ratio (ref 0.0–0.9)

## 2018-08-03 LAB — TSH: TSH: 1.47 u[IU]/mL (ref 0.450–4.500)

## 2018-08-06 MED ORDER — ATORVASTATIN CALCIUM 80 MG PO TABS: 80.0000 mg | ORAL_TABLET | Freq: Every day | ORAL | 3 refills | Status: DC

## 2018-08-06 NOTE — Addendum Note (Signed)
Addended by: Rutherford Guys on: 08/06/2018 12:09 AM   Modules accepted: Orders

## 2018-08-08 NOTE — Pre-Procedure Instructions (Signed)
[name]  08/08/2018      [id number] - [place], [place] [address] [name] [address] Phone: [phone] Fax: [phone]    Your procedure is scheduled on August 14, 2018.  Report to [facility] Admitting at 625 AM.  Call this number if you have problems the morning of surgery:  [phone]   Remember:  Do not eat or drink after midnight.    Take these medicines the morning of surgery with A SIP OF WATER  Pantoprazole (protonix) Oxycodone-if needed Tylenol-if needed  Follow your surgeon's instructions on when to hold/resume aspirin.  If no instructions were given call the office to determine how they would like to you take aspirin  7 days prior to surgery STOP taking any Aleve, Naproxen, Ibuprofen, Motrin, Advil, Goody's, BC's, all herbal medications, fish oil, and all vitamins    Do not wear jewelry, make-up or nail polish.  Do not wear lotions, powders, or perfumes, or deodorant.  Do not shave 48 hours prior to surgery.    Do not bring valuables to the hospital.  [facility] is not responsible for any belongings or valuables.  Contacts, dentures or bridgework may not be worn into surgery.  Leave your suitcase in the car.  After surgery it may be brought to your room.  For patients admitted to the hospital, discharge time will be determined by your treatment team.  Patients discharged the day of surgery will not be allowed to drive home.    [place]- Preparing For Surgery  Before surgery, you can play an important role. Because skin is not sterile, your skin needs to be as free of germs as possible. You can reduce the number of germs on your skin by washing with CHG (chlorahexidine gluconate) Soap before surgery.  CHG is an antiseptic cleaner which kills germs and bonds with the skin to continue killing germs even after washing.    Oral Hygiene is also important to  reduce your risk of infection.  Remember - BRUSH YOUR TEETH THE MORNING OF SURGERY WITH YOUR REGULAR TOOTHPASTE  Please do not use if you have an allergy to CHG or antibacterial soaps. If your skin becomes reddened/irritated stop using the CHG.  Do not shave (including legs and underarms) for at least 48 hours prior to first CHG shower. It is OK to shave your face.  Please follow these instructions carefully.   1. Shower the NIGHT BEFORE SURGERY and the MORNING OF SURGERY with CHG.   2. If you chose to wash your hair, wash your hair first as usual with your normal shampoo.  3. After you shampoo, rinse your hair and body thoroughly to remove the shampoo.  4. Use CHG as you would any other liquid soap. You can apply CHG directly to the skin and wash gently with a scrungie or a clean washcloth.   5. Apply the CHG Soap to your body ONLY FROM THE NECK DOWN.  Do not use on open wounds or open sores. Avoid contact with your eyes, ears, mouth and genitals (private parts). Wash Face and genitals (private parts)  with your normal soap.  6. Wash thoroughly, paying special attention to the area where your surgery will be performed.  7. Thoroughly rinse your body with warm water from the neck down.  8. DO NOT shower/wash with your normal soap after using and rinsing off the CHG Soap.  9. [name]  yourself dry with a CLEAN TOWEL.  10. Wear CLEAN PAJAMAS to bed the night before surgery, wear comfortable clothes the morning of surgery  11. Place CLEAN SHEETS on your bed the night of your first shower and DO NOT SLEEP WITH PETS.  Day of Surgery:  Do not apply any deodorants/lotions.  Please wear clean clothes to the hospital/surgery center.   Remember to brush your teeth WITH YOUR REGULAR TOOTHPASTE.   Please read over the following fact sheets that you were given.

## 2018-08-09 ENCOUNTER — Other Ambulatory Visit: Payer: Self-pay

## 2018-08-09 ENCOUNTER — Encounter (HOSPITAL_COMMUNITY): Payer: Self-pay

## 2018-08-09 ENCOUNTER — Ambulatory Visit (INDEPENDENT_AMBULATORY_CARE_PROVIDER_SITE_OTHER)
Admission: RE | Admit: 2018-08-09 | Discharge: 2018-08-09 | Disposition: A | Discharge status: Home / Self Care | Payer: Medicare Other | Source: Ambulatory Visit | Attending: Emergency Medicine | Admitting: Emergency Medicine

## 2018-08-09 ENCOUNTER — Encounter (HOSPITAL_COMMUNITY)
Admission: RE | Admit: 2018-08-09 | Discharge: 2018-08-09 | Disposition: A | Discharge status: Home / Self Care | Payer: Medicare Other | Source: Ambulatory Visit | Attending: Emergency Medicine | Admitting: Emergency Medicine

## 2018-08-09 DIAGNOSIS — Z9889 Other specified postprocedural states: Secondary | ICD-10-CM

## 2018-08-09 DIAGNOSIS — R918 Other nonspecific abnormal finding of lung field: Secondary | ICD-10-CM

## 2018-08-09 DIAGNOSIS — Z01812 Encounter for preprocedural laboratory examination: Secondary | ICD-10-CM | POA: Insufficient documentation

## 2018-08-09 HISTORY — DX: Cerebral infarction, unspecified: I63.9

## 2018-08-09 HISTORY — DX: Unspecified osteoarthritis, unspecified site: M19.90

## 2018-08-09 LAB — CBC
HCT: 41.6 % (ref 36.0–46.0)
Hemoglobin: 12.1 g/dL (ref 12.0–15.0)
MCH: 27.3 pg (ref 26.0–34.0)
MCHC: 29.1 g/dL — ABNORMAL LOW (ref 30.0–36.0)
MCV: 93.9 fL (ref 80.0–100.0)
Platelets: 286 10*3/uL (ref 150–400)
RBC: 4.43 MIL/uL (ref 3.87–5.11)
RDW: 15.3 % (ref 11.5–15.5)
WBC: 7.6 10*3/uL (ref 4.0–10.5)
nRBC: 0 % (ref 0.0–0.2)

## 2018-08-09 LAB — PROTIME-INR
INR: 0.94
Prothrombin Time: 12.5 seconds (ref 11.4–15.2)

## 2018-08-09 LAB — COMPREHENSIVE METABOLIC PANEL
ALT: 11 U/L (ref 0–44)
AST: 18 U/L (ref 15–41)
Albumin: 3.6 g/dL (ref 3.5–5.0)
Alkaline Phosphatase: 58 U/L (ref 38–126)
Anion gap: 12 (ref 5–15)
BUN: 14 mg/dL (ref 8–23)
CO2: 24 mmol/L (ref 22–32)
Calcium: 9.2 mg/dL (ref 8.9–10.3)
Chloride: 102 mmol/L (ref 98–111)
Creatinine, Ser: 0.87 mg/dL (ref 0.44–1.00)
GFR calc Af Amer: >60 mL/min (ref >60)
GFR calc non Af Amer: >60 mL/min (ref >60)
GLUCOSE: 111 mg/dL — AB (ref 70–99)
Potassium: 3.6 mmol/L (ref 3.5–5.1)
SODIUM: 138 mmol/L (ref 135–145)
Total Bilirubin: 0.4 mg/dL (ref 0.3–1.2)
Total Protein: 6.9 g/dL (ref 6.5–8.1)

## 2018-08-09 LAB — APTT: aPTT: 30 seconds (ref 24–36)

## 2018-08-09 NOTE — Progress Notes (Addendum)
PCP - Delman Cheadle, MD Cardiologist - pt denies  Chest x-ray -  Chest CT schedule today -chest x-ray ordered post procedure EKG - 06/23/18 in EPIC  Stress Test - pt denies ECHO - 06/23/2018 in EPIC  Cardiac Cath - pt denies  Sleep Study - no CPAP - n/a  Fasting Blood Sugar - n/a Checks Blood Sugar _____ times a day-n/a  Blood Thinner Instructions: n/a Aspirin Instructions: ASA 325 mg- Begin holding 08/10/2018  Anesthesia review: NO  Patient denies shortness of breath, fever, cough and chest pain at PAT appointment  Patient verbalized understanding of instructions that were given to them at the PAT appointment. Patient was also instructed that they will need to review over the PAT instructions again at home before surgery.

## 2018-08-13 MED ORDER — FENTANYL CITRATE (PF) 250 MCG/5ML IJ SOLN
INTRAMUSCULAR | Status: AC
  Filled 2018-08-13: qty 5

## 2018-08-13 MED ORDER — PROPOFOL 10 MG/ML IV BOLUS
INTRAVENOUS | Status: AC
  Filled 2018-08-13: qty 20

## 2018-08-14 ENCOUNTER — Encounter (HOSPITAL_COMMUNITY)
Admission: RE | Disposition: A | Discharge status: Home / Self Care | Payer: Self-pay | Source: Home / Self Care | Attending: Emergency Medicine

## 2018-08-14 ENCOUNTER — Other Ambulatory Visit: Payer: Self-pay

## 2018-08-14 ENCOUNTER — Ambulatory Visit (HOSPITAL_COMMUNITY): Payer: Medicare Other

## 2018-08-14 ENCOUNTER — Encounter (HOSPITAL_COMMUNITY): Payer: Self-pay | Admitting: Certified Registered Nurse Anesthetist

## 2018-08-14 ENCOUNTER — Ambulatory Visit (HOSPITAL_COMMUNITY): Payer: Medicare Other | Admitting: Certified Registered Nurse Anesthetist

## 2018-08-14 ENCOUNTER — Ambulatory Visit (HOSPITAL_COMMUNITY)
Admission: RE | Admit: 2018-08-14 | Discharge: 2018-08-14 | Disposition: A | Discharge status: Home / Self Care | Payer: Medicare Other | Attending: Emergency Medicine | Admitting: Emergency Medicine

## 2018-08-14 DIAGNOSIS — I739 Peripheral vascular disease, unspecified: Secondary | ICD-10-CM | POA: Insufficient documentation

## 2018-08-14 DIAGNOSIS — E785 Hyperlipidemia, unspecified: Secondary | ICD-10-CM | POA: Diagnosis not present

## 2018-08-14 DIAGNOSIS — K219 Gastro-esophageal reflux disease without esophagitis: Secondary | ICD-10-CM | POA: Diagnosis not present

## 2018-08-14 DIAGNOSIS — R918 Other nonspecific abnormal finding of lung field: Secondary | ICD-10-CM | POA: Diagnosis present

## 2018-08-14 DIAGNOSIS — Z9889 Other specified postprocedural states: Secondary | ICD-10-CM

## 2018-08-14 DIAGNOSIS — C3431 Malignant neoplasm of lower lobe, right bronchus or lung: Secondary | ICD-10-CM | POA: Diagnosis not present

## 2018-08-14 DIAGNOSIS — C771 Secondary and unspecified malignant neoplasm of intrathoracic lymph nodes: Secondary | ICD-10-CM | POA: Insufficient documentation

## 2018-08-14 DIAGNOSIS — I10 Essential (primary) hypertension: Secondary | ICD-10-CM | POA: Diagnosis not present

## 2018-08-14 DIAGNOSIS — Z8673 Personal history of transient ischemic attack (TIA), and cerebral infarction without residual deficits: Secondary | ICD-10-CM | POA: Diagnosis not present

## 2018-08-14 DIAGNOSIS — Z419 Encounter for procedure for purposes other than remedying health state, unspecified: Secondary | ICD-10-CM

## 2018-08-14 DIAGNOSIS — D630 Anemia in neoplastic disease: Secondary | ICD-10-CM | POA: Insufficient documentation

## 2018-08-14 DIAGNOSIS — Z7982 Long term (current) use of aspirin: Secondary | ICD-10-CM | POA: Diagnosis not present

## 2018-08-14 DIAGNOSIS — Z87891 Personal history of nicotine dependence: Secondary | ICD-10-CM | POA: Insufficient documentation

## 2018-08-14 HISTORY — PX: VIDEO BRONCHOSCOPY WITH ENDOBRONCHIAL ULTRASOUND: SHX6177

## 2018-08-14 SURGERY — BRONCHOSCOPY, WITH EBUS
Anesthesia: General

## 2018-08-14 MED ORDER — ONDANSETRON HCL 4 MG/2ML IJ SOLN: 4.0000 mg | Freq: Once | INTRAMUSCULAR | Status: DC | PRN

## 2018-08-14 MED ORDER — ONDANSETRON HCL 4 MG/2ML IJ SOLN
INTRAMUSCULAR | Status: AC
  Filled 2018-08-14: qty 2

## 2018-08-14 MED ORDER — MIDAZOLAM HCL 5 MG/5ML IJ SOLN
INTRAMUSCULAR | Status: DC | PRN
  Administered 2018-08-14: 2 mg via INTRAVENOUS

## 2018-08-14 MED ORDER — EPHEDRINE SULFATE 50 MG/ML IJ SOLN
INTRAMUSCULAR | Status: DC | PRN
  Administered 2018-08-14 (×2): 10 mg via INTRAVENOUS

## 2018-08-14 MED ORDER — DEXAMETHASONE SODIUM PHOSPHATE 10 MG/ML IJ SOLN
INTRAMUSCULAR | Status: AC
  Filled 2018-08-14: qty 1

## 2018-08-14 MED ORDER — LACTATED RINGERS IV SOLN
INTRAVENOUS | Status: DC
  Administered 2018-08-14: 08:00:00 via INTRAVENOUS

## 2018-08-14 MED ORDER — ARTIFICIAL TEARS OPHTHALMIC OINT
TOPICAL_OINTMENT | OPHTHALMIC | Status: AC
  Filled 2018-08-14: qty 3.5

## 2018-08-14 MED ORDER — PHENYLEPHRINE HCL 10 MG/ML IJ SOLN
INTRAMUSCULAR | Status: DC | PRN
  Administered 2018-08-14: 120 ug via INTRAVENOUS

## 2018-08-14 MED ORDER — FENTANYL CITRATE (PF) 250 MCG/5ML IJ SOLN
INTRAMUSCULAR | Status: AC
  Filled 2018-08-14: qty 5

## 2018-08-14 MED ORDER — PROPOFOL 10 MG/ML IV BOLUS
INTRAVENOUS | Status: DC | PRN
  Administered 2018-08-14: 150 mg via INTRAVENOUS

## 2018-08-14 MED ORDER — PHENYLEPHRINE 40 MCG/ML (10ML) SYRINGE FOR IV PUSH (FOR BLOOD PRESSURE SUPPORT)
PREFILLED_SYRINGE | INTRAVENOUS | Status: AC
  Filled 2018-08-14: qty 10

## 2018-08-14 MED ORDER — PROPOFOL 10 MG/ML IV BOLUS
INTRAVENOUS | Status: AC
  Filled 2018-08-14: qty 20

## 2018-08-14 MED ORDER — DEXAMETHASONE SODIUM PHOSPHATE 10 MG/ML IJ SOLN
INTRAMUSCULAR | Status: DC | PRN
  Administered 2018-08-14: 10 mg via INTRAVENOUS

## 2018-08-14 MED ORDER — ACETAMINOPHEN 500 MG PO TABS: 1000.0000 mg | ORAL_TABLET | Freq: Once | ORAL | Status: DC

## 2018-08-14 MED ORDER — FENTANYL CITRATE (PF) 100 MCG/2ML IJ SOLN: 25.0000 ug | INTRAMUSCULAR | Status: DC | PRN

## 2018-08-14 MED ORDER — SUGAMMADEX SODIUM 200 MG/2ML IV SOLN
INTRAVENOUS | Status: DC | PRN
  Administered 2018-08-14: 200 mg via INTRAVENOUS

## 2018-08-14 MED ORDER — FENTANYL CITRATE (PF) 100 MCG/2ML IJ SOLN
INTRAMUSCULAR | Status: DC | PRN
  Administered 2018-08-14: 100 ug via INTRAVENOUS

## 2018-08-14 MED ORDER — LIDOCAINE 2% (20 MG/ML) 5 ML SYRINGE
INTRAMUSCULAR | Status: DC | PRN
  Administered 2018-08-14: 40 mg via INTRAVENOUS
  Administered 2018-08-14: 60 mg via INTRAVENOUS

## 2018-08-14 MED ORDER — ONDANSETRON HCL 4 MG/2ML IJ SOLN
INTRAMUSCULAR | Status: DC | PRN
  Administered 2018-08-14: 4 mg via INTRAVENOUS

## 2018-08-14 MED ORDER — ROCURONIUM BROMIDE 50 MG/5ML IV SOSY
PREFILLED_SYRINGE | INTRAVENOUS | Status: AC
  Filled 2018-08-14: qty 15

## 2018-08-14 MED ORDER — PHENYLEPHRINE 40 MCG/ML (10ML) SYRINGE FOR IV PUSH (FOR BLOOD PRESSURE SUPPORT)
PREFILLED_SYRINGE | INTRAVENOUS | Status: AC
  Filled 2018-08-14: qty 20

## 2018-08-14 MED ORDER — ROCURONIUM BROMIDE 50 MG/5ML IV SOSY
PREFILLED_SYRINGE | INTRAVENOUS | Status: DC | PRN
  Administered 2018-08-14: 10 mg via INTRAVENOUS
  Administered 2018-08-14: 30 mg via INTRAVENOUS
  Administered 2018-08-14: 40 mg via INTRAVENOUS

## 2018-08-14 MED ORDER — LACTATED RINGERS IV SOLN
INTRAVENOUS | Status: DC | PRN
  Administered 2018-08-14: 08:00:00 via INTRAVENOUS

## 2018-08-14 MED ORDER — SODIUM CHLORIDE 0.9 % IV SOLN
INTRAVENOUS | Status: DC | PRN
  Administered 2018-08-14: 45 ug/min via INTRAVENOUS

## 2018-08-14 MED ORDER — MIDAZOLAM HCL 2 MG/2ML IJ SOLN
INTRAMUSCULAR | Status: AC
  Filled 2018-08-14: qty 2

## 2018-08-14 MED ORDER — 0.9 % SODIUM CHLORIDE (POUR BTL) OPTIME
TOPICAL | Status: DC | PRN
  Administered 2018-08-14: 1000 mL

## 2018-08-14 SURGICAL SUPPLY — 51 items
ADAPTER BRONCHOSCOPE OLYMPUS (ADAPTER) ×3 IMPLANT
ADAPTER VALVE BIOPSY EBUS (MISCELLANEOUS) IMPLANT
ADPTR VALVE BIOPSY EBUS (MISCELLANEOUS) ×2
BRUSH CYTOL CELLEBRITY 1.5X140 (MISCELLANEOUS) ×3 IMPLANT
BRUSH SUPERTRAX BIOPSY (INSTRUMENTS) IMPLANT
BRUSH SUPERTRAX NDL-TIP CYTO (INSTRUMENTS) ×3 IMPLANT
CANISTER SUCT 3000ML PPV (MISCELLANEOUS) ×3 IMPLANT
CHANNEL WORK EXTEND EDGE 180 (KITS) IMPLANT
CHANNEL WORK EXTEND EDGE 90 (KITS) IMPLANT
CONT SPEC 4OZ CLIKSEAL STRL BL (MISCELLANEOUS) ×3 IMPLANT
COVER BACK TABLE 60X90IN (DRAPES) ×3 IMPLANT
COVER WAND RF STERILE (DRAPES) ×3 IMPLANT
FILTER STRAW FLUID ASPIR (MISCELLANEOUS) IMPLANT
FORCEPS BIOP RJ4 1.8 (CUTTING FORCEPS) IMPLANT
FORCEPS BIOP SUPERTRX PREMAR (INSTRUMENTS) ×3 IMPLANT
FORCEPS RADIAL JAW LRG 4 PULM (INSTRUMENTS) IMPLANT
GAUZE SPONGE 4X4 12PLY STRL (GAUZE/BANDAGES/DRESSINGS) ×3 IMPLANT
GLOVE BIO SURGEON STRL SZ7.5 (GLOVE) ×6 IMPLANT
GOWN STRL REUS W/ TWL LRG LVL3 (GOWN DISPOSABLE) ×2 IMPLANT
GOWN STRL REUS W/TWL LRG LVL3 (GOWN DISPOSABLE) ×4
KIT CLEAN ENDO COMPLIANCE (KITS) ×6 IMPLANT
KIT LOCATABLE GUIDE (CANNULA) IMPLANT
KIT MARKER FIDUCIAL DELIVERY (KITS) IMPLANT
KIT PROCEDURE EDGE 180 (KITS) IMPLANT
KIT PROCEDURE EDGE 90 (KITS) IMPLANT
KIT TURNOVER KIT B (KITS) ×3 IMPLANT
MARKER SKIN DUAL TIP RULER LAB (MISCELLANEOUS) ×3 IMPLANT
NDL ASPIRATION VIZISHOT 19G (NEEDLE) IMPLANT
NDL ASPIRATION VIZISHOT 21G (NEEDLE) ×1 IMPLANT
NDL SUPERTRX PREMARK BIOPSY (NEEDLE) ×1 IMPLANT
NEEDLE ASPIRATION VIZISHOT 19G (NEEDLE) ×3 IMPLANT
NEEDLE ASPIRATION VIZISHOT 21G (NEEDLE) IMPLANT
NEEDLE SUPERTRX PREMARK BIOPSY (NEEDLE) ×3 IMPLANT
NS IRRIG 1000ML POUR BTL (IV SOLUTION) ×3 IMPLANT
OIL SILICONE PENTAX (PARTS (SERVICE/REPAIRS)) ×3 IMPLANT
PAD ARMBOARD 7.5X6 YLW CONV (MISCELLANEOUS) ×6 IMPLANT
PATCHES PATIENT (LABEL) ×9 IMPLANT
RADIAL JAW LRG 4 PULMONARY (INSTRUMENTS) ×2
SYR 20CC LL (SYRINGE) ×6 IMPLANT
SYR 20ML ECCENTRIC (SYRINGE) ×6 IMPLANT
SYR 50ML SLIP (SYRINGE) ×3 IMPLANT
SYR 5ML LUER SLIP (SYRINGE) ×3 IMPLANT
TOWEL OR 17X24 6PK STRL BLUE (TOWEL DISPOSABLE) ×3 IMPLANT
TRAP SPECIMEN MUCOUS 40CC (MISCELLANEOUS) IMPLANT
TUBE CONNECTING 20'X1/4 (TUBING) ×2
TUBE CONNECTING 20X1/4 (TUBING) ×4 IMPLANT
UNDERPAD 30X30 (UNDERPADS AND DIAPERS) ×3 IMPLANT
VALVE BIOPSY  SINGLE USE (MISCELLANEOUS) ×2
VALVE BIOPSY SINGLE USE (MISCELLANEOUS) ×1 IMPLANT
VALVE SUCTION BRONCHIO DISP (MISCELLANEOUS) ×3 IMPLANT
WATER STERILE IRR 1000ML POUR (IV SOLUTION) ×3 IMPLANT

## 2018-08-14 NOTE — Discharge Instructions (Signed)
Flexible Bronchoscopy, Care After This sheet gives you information about how to care for yourself after your test. Your doctor may also give you more specific instructions. If you have problems or questions, contact your doctor. Follow these instructions at home: Eating and drinking  Do not eat or drink anything (not even water) for 2 hours after your test, or until your numbing medicine (local anesthetic) wears off.  When your numbness is gone and your cough and gag reflexes have come back, you may: ? Eat only soft foods. ? Slowly drink liquids.  The day after the test, go back to your normal diet. Driving  Do not drive for 24 hours if you were given a medicine to help you relax (sedative).  Do not drive or use heavy machinery while taking prescription pain medicine. General instructions   Take over-the-counter and prescription medicines only as told by your doctor.  Return to your normal activities as told. Ask what activities are safe for you.  Do not use any products that have nicotine or tobacco in them. This includes cigarettes and e-cigarettes. If you need help quitting, ask your doctor.  Keep all follow-up visits as told by your doctor. This is important. It is very important if you had a tissue sample (biopsy) taken. Get help right away if:  You have shortness of breath that gets worse.  You get light-headed.  You feel like you are going to pass out (faint).  You have chest pain.  You cough up: ? More than a little blood. ? More blood than before. Summary  Do not eat or drink anything (not even water) for 2 hours after your test, or until your numbing medicine wears off.  Do not use cigarettes. Do not use e-cigarettes.  Get help right away if you have chest pain.  Please call our office for any questions or concerns. [phone].   This information is not intended to replace advice given to you by your health care provider. Make sure you discuss any  questions you have with your health care provider. Document Released: 04/30/2009 Document Revised: 07/21/2016 Document Reviewed: 07/21/2016 Elsevier Interactive Patient Education  2019 [employer].

## 2018-08-14 NOTE — Anesthesia Preprocedure Evaluation (Addendum)
Anesthesia Evaluation  Patient identified by MRN, date of birth, ID band Patient awake    Reviewed: Allergy & Precautions, NPO status , Patient's Chart, lab work & pertinent test results  Airway Mallampati: II  TM Distance: >3 FB Neck ROM: Full    Dental  (+) Dental Advisory Given, Upper Dentures, Lower Dentures   Pulmonary former smoker,  LUNG MASS RIGHT LOWER LOBE   Pulmonary exam normal breath sounds clear to auscultation       Cardiovascular hypertension, Pt. on medications + Peripheral Vascular Disease  Normal cardiovascular exam Rhythm:Regular Rate:Normal     Neuro/Psych CVA    GI/Hepatic Neg liver ROS, GERD  Medicated,  Endo/Other  negative endocrine ROSObesity   Renal/GU negative Renal ROS     Musculoskeletal  (+) Arthritis ,   Abdominal   Peds  Hematology  (+) Blood dyscrasia, anemia ,   Anesthesia Other Findings Day of surgery medications reviewed with the patient.  Reproductive/Obstetrics                           Anesthesia Physical Anesthesia Plan  ASA: III  Anesthesia Plan: General   Post-op Pain Management:    Induction: Intravenous  PONV Risk Score and Plan: 3 and Midazolam, Dexamethasone and Ondansetron  Airway Management Planned: Oral ETT  Additional Equipment:   Intra-op Plan:   Post-operative Plan: Extubation in OR  Informed Consent: I have reviewed the patients History and Physical, chart, labs and discussed the procedure including the risks, benefits and alternatives for the proposed anesthesia with the patient or authorized representative who has indicated his/her understanding and acceptance.     Dental advisory given  Plan Discussed with: CRNA  Anesthesia Plan Comments:         Anesthesia Quick Evaluation

## 2018-08-14 NOTE — Anesthesia Postprocedure Evaluation (Signed)
Anesthesia Post Note  Patient: [name]  Procedure(s) Performed: VIDEO BRONCHOSCOPY WITH ENDOBRONCHIAL ULTRASOUND (N/A )     Patient location during evaluation: PACU Anesthesia Type: General Level of consciousness: awake and alert Pain management: pain level controlled Vital Signs Assessment: post-procedure vital signs reviewed and stable Respiratory status: spontaneous breathing, nonlabored ventilation, respiratory function stable and patient connected to nasal cannula oxygen Cardiovascular status: blood pressure returned to baseline and stable Postop Assessment: no apparent nausea or vomiting Anesthetic complications: no    Last Vitals:  Vitals:   08/14/18 1045 08/14/18 1055  BP: 110/67 96/65  Pulse: 92 93  Resp: 18 16  Temp: 36.6 C   SpO2: 93% 94%    Last Pain:  Vitals:   08/14/18 1000  TempSrc:   PainSc: [place]

## 2018-08-14 NOTE — Transfer of Care (Signed)
Immediate Anesthesia Transfer of Care Note  Patient: [name]  Procedure(s) Performed: VIDEO BRONCHOSCOPY WITH ENDOBRONCHIAL ULTRASOUND (N/A )  Patient Location: PACU  Anesthesia Type:General  Level of Consciousness: awake, alert  and oriented  Airway & Oxygen Therapy: Patient Spontanous Breathing and Patient connected to face mask oxygen  Post-op Assessment: Report given to RN and Post -op Vital signs reviewed and stable  Post vital signs: Reviewed and stable  Last Vitals:  Vitals Value Taken Time  BP 116/68 08/14/2018 10:03 AM  Temp    Pulse 96 08/14/2018 10:14 AM  Resp 18 08/14/2018 10:14 AM  SpO2 94 % 08/14/2018 10:14 AM  Vitals shown include unvalidated device data.  Last Pain:  Vitals:   08/14/18 1000  TempSrc:   PainSc: 9       Patients Stated Pain Goal: 5 (32/20/25 4270)  Complications: No apparent anesthesia complications

## 2018-08-14 NOTE — Anesthesia Procedure Notes (Signed)
Procedure Name: Intubation Date/Time: 08/14/2018 8:35 AM Performed by: Inda Coke, CRNA Pre-anesthesia Checklist: Patient identified, Emergency Drugs available, Suction available and Patient being monitored Patient Re-evaluated:Patient Re-evaluated prior to induction Oxygen Delivery Method: Circle System Utilized Preoxygenation: Pre-oxygenation with 100% oxygen Induction Type: IV induction Ventilation: Mask ventilation without difficulty Laryngoscope Size: Mac and 3 Tube type: Oral Tube size: 8.5 mm Number of attempts: 1 Airway Equipment and Method: Stylet and Oral airway Placement Confirmation: ETT inserted through vocal cords under direct vision,  positive ETCO2 and breath sounds checked- equal and bilateral Secured at: 23 cm Tube secured with: Tape Dental Injury: Teeth and Oropharynx as per pre-operative assessment

## 2018-08-14 NOTE — Op Note (Signed)
Video Bronchoscopy with Endobronchial Ultrasound Procedure Note  Date of Operation: 08/14/2018  Pre-op Diagnosis: Right lower lobe mass, right hilar mass  Post-op Diagnosis: Same, suspected primary lung cancer  Surgeon: Baltazar Apo  Assistants: None  Anesthesia: General endotracheal anesthesia  Operation: Flexible video fiberoptic bronchoscopy with endobronchial ultrasound and biopsies.  Estimated Blood Loss: Minimal  Complications: None apparent  Indications and History: [name] is a 68 y.o. female with history of tobacco use.  She was admitted recently for GI bleeding and a CVA.  Part of her evaluation included neck imaging that showed a right hilar mass.  Subsequent CT chest confirmed the right hilar mass, mediastinal lymphadenopathy and a right lower lobe opacity consistent with a mass as well.  Recommendation was made to achieve a tissue diagnosis via bronchoscopy with endobronchial ultrasound and possibly navigation if necessary.  The risks, benefits, complications, treatment options and expected outcomes were discussed with the patient.  The possibilities of pneumothorax, pneumonia, reaction to medication, pulmonary aspiration, perforation of a viscus, bleeding, failure to diagnose a condition and creating a complication requiring transfusion or operation were discussed with the patient who freely signed the consent.    Description of Procedure: The patient was examined in the preoperative area and history and data from the preprocedure consultation were reviewed. It was deemed appropriate to proceed.  The patient was taken to OR 10, identified as [name] and the procedure verified as Flexible Video Fiberoptic Bronchoscopy.  A Time Out was held and the above information confirmed. After being taken to the operating room general anesthesia was initiated and the patient  was orally intubated. The video fiberoptic bronchoscope was introduced via the endotracheal tube  and a general inspection was performed which showed normal airways throughout with exception of the basilar segment of the right lower lobe which showed a friable white vascular mass in the airway.  The segment was occluded.  Endobronchial brushings and endobronchial biopsies were performed on the mass and sent for cytology and pathology.  The standard scope was withdrawn and the endobronchial ultrasound was used to identify and characterize the peritracheal, hilar and bronchial lymph nodes. Inspection showed enlargement at stations 4R, 7, 11R. Using real-time ultrasound guidance Wang needle biopsies were take from Station 4R, 11R nodes and were sent for cytology. The patient tolerated the procedure well without apparent complications. There was no significant blood loss. The bronchoscope was withdrawn. Anesthesia was reversed and the patient was taken to the PACU for recovery.   Samples: 1. Wang needle biopsies from 4R node 2. Wang needle biopsies from 11R node 3.  Endobronchial brushings from right lower lobe mass 4.  Endobronchial biopsies from right lower lobe mass  Plans:  The patient will be discharged from the PACU to home when recovered from anesthesia. We will review the cytology, pathology results with the patient when they become available. Outpatient followup will be with Dr. Lamonte Sakai.    Baltazar Apo, MD, PhD 08/14/2018, 10:14 AM [facility] [phone] or if no answer [phone]

## 2018-08-14 NOTE — Interval H&P Note (Signed)
PCCM interval note  [name] is 60, history of tobacco use, hypertension.  Recent GI bleed bleed and CVA.  During that hospitalization and evaluation she was found to have a large right hilar mass that extends into the right lower lobe, associated right paratracheal and mediastinal lymphadenopathy.  She has been recovering, made a lot of neurological progress since the stroke.  She is been hypertensive and recently lisinopril was added to her regimen.  She has had cough in association with this.  She is also having a lot of back pain since she cannot use her NSAIDs anymore.  Vitals:   08/14/18 0618  BP: (!) 164/86  Pulse: 81  Resp: 18  Temp: 97.9 F (36.6 C)  TempSrc: Oral  SpO2: 100%  Weight: 92.1 kg  Height: 5\' 5"  (1.651 m)  Obese woman, no distress.  Lung exam is clear with some decreased breath sounds at both bases.  Heart regular without a murmur.  Abdomen is soft, obese, nontender, benign.  No significant edema.  Some venous stasis changes on her lower extremities.  Impression:  Right lower lobe and hilar mass with associated right-sided lymphadenopathy.  We discussed bronchoscopy for biopsy.  She understands the risks, benefits and elects to proceed.  All questions answered.  No barriers identified.  She stopped her aspirin last Friday.  Baltazar Apo, MD, PhD 08/14/2018, 8:19 AM  Pulmonary and Critical Care [phone] or if no answer [phone]

## 2018-08-15 ENCOUNTER — Encounter (HOSPITAL_COMMUNITY): Payer: Self-pay | Admitting: Emergency Medicine

## 2018-08-21 ENCOUNTER — Telehealth: Payer: Self-pay | Admitting: Emergency Medicine

## 2018-08-21 DIAGNOSIS — C3490 Malignant neoplasm of unspecified part of unspecified bronchus or lung: Secondary | ICD-10-CM

## 2018-08-21 NOTE — Telephone Encounter (Signed)
Spoke with the patient, gave her the dx small cell lung CA. She wants to be referred to the [facility], I will do so today. She will need a PET scan and MRI brain. Will work on this as well.

## 2018-08-22 ENCOUNTER — Ambulatory Visit: Payer: Medicare Other | Admitting: Emergency Medicine

## 2018-08-23 ENCOUNTER — Telehealth: Payer: Self-pay | Admitting: *Deleted

## 2018-08-23 DIAGNOSIS — R918 Other nonspecific abnormal finding of lung field: Secondary | ICD-10-CM

## 2018-08-23 NOTE — Telephone Encounter (Signed)
Oncology Nurse Navigator Documentation  Oncology Nurse Navigator Flowsheets 08/23/2018  Navigator Location [facility]  Referral date to RadOnc/MedOnc 08/22/2018  Navigator Encounter Type Telephone/I received referral on [name] yesterday.  I called and scheduled her to be seen on 08/28/2018 labs at 2:30 and see Dr. Julien Nordmann at 3:00.  She verbalized understanding of appt time and place.   Telephone Outgoing Call  Treatment Phase Pre-Tx/Tx Discussion  Barriers/Navigation Needs Education;Coordination of Care  Education Other  Interventions Coordination of Care;Education  Coordination of Care Appts  Education Method Verbal  Acuity Level 2  Time Spent with Patient 15

## 2018-08-28 ENCOUNTER — Inpatient Hospital Stay: Payer: Medicare Other | Attending: Internal Medicine

## 2018-08-28 ENCOUNTER — Telehealth: Payer: Self-pay

## 2018-08-28 ENCOUNTER — Encounter: Payer: Self-pay | Admitting: *Deleted

## 2018-08-28 ENCOUNTER — Inpatient Hospital Stay (HOSPITAL_BASED_OUTPATIENT_CLINIC_OR_DEPARTMENT_OTHER): Payer: Medicare Other | Admitting: Internal Medicine

## 2018-08-28 ENCOUNTER — Encounter: Payer: Self-pay | Admitting: Internal Medicine

## 2018-08-28 VITALS — BP 148/82 | HR 85 | Temp 98.2°F | Resp 20 | Ht 65.0 in | Wt 204.9 lb

## 2018-08-28 DIAGNOSIS — E785 Hyperlipidemia, unspecified: Secondary | ICD-10-CM

## 2018-08-28 DIAGNOSIS — R948 Abnormal results of function studies of other organs and systems: Secondary | ICD-10-CM | POA: Diagnosis not present

## 2018-08-28 DIAGNOSIS — R110 Nausea: Secondary | ICD-10-CM | POA: Diagnosis not present

## 2018-08-28 DIAGNOSIS — Z5111 Encounter for antineoplastic chemotherapy: Secondary | ICD-10-CM | POA: Insufficient documentation

## 2018-08-28 DIAGNOSIS — E119 Type 2 diabetes mellitus without complications: Secondary | ICD-10-CM | POA: Insufficient documentation

## 2018-08-28 DIAGNOSIS — M1990 Unspecified osteoarthritis, unspecified site: Secondary | ICD-10-CM | POA: Diagnosis not present

## 2018-08-28 DIAGNOSIS — Z87891 Personal history of nicotine dependence: Secondary | ICD-10-CM | POA: Diagnosis not present

## 2018-08-28 DIAGNOSIS — M549 Dorsalgia, unspecified: Secondary | ICD-10-CM | POA: Diagnosis not present

## 2018-08-28 DIAGNOSIS — Z7982 Long term (current) use of aspirin: Secondary | ICD-10-CM | POA: Diagnosis not present

## 2018-08-28 DIAGNOSIS — C3431 Malignant neoplasm of lower lobe, right bronchus or lung: Secondary | ICD-10-CM | POA: Insufficient documentation

## 2018-08-28 DIAGNOSIS — C781 Secondary malignant neoplasm of mediastinum: Secondary | ICD-10-CM

## 2018-08-28 DIAGNOSIS — G8929 Other chronic pain: Secondary | ICD-10-CM | POA: Insufficient documentation

## 2018-08-28 DIAGNOSIS — R439 Unspecified disturbances of smell and taste: Secondary | ICD-10-CM | POA: Insufficient documentation

## 2018-08-28 DIAGNOSIS — Z79899 Other long term (current) drug therapy: Secondary | ICD-10-CM | POA: Insufficient documentation

## 2018-08-28 DIAGNOSIS — Z8673 Personal history of transient ischemic attack (TIA), and cerebral infarction without residual deficits: Secondary | ICD-10-CM

## 2018-08-28 DIAGNOSIS — I10 Essential (primary) hypertension: Secondary | ICD-10-CM | POA: Insufficient documentation

## 2018-08-28 DIAGNOSIS — Z7189 Other specified counseling: Secondary | ICD-10-CM

## 2018-08-28 DIAGNOSIS — R918 Other nonspecific abnormal finding of lung field: Secondary | ICD-10-CM

## 2018-08-28 LAB — CBC WITH DIFFERENTIAL (CANCER CENTER ONLY)
Abs Immature Granulocytes: 0.01 10*3/uL (ref 0.00–0.07)
Basophils Absolute: 0 10*3/uL (ref 0.0–0.1)
Basophils Relative: 1 %
Eosinophils Absolute: 0.2 10*3/uL (ref 0.0–0.5)
Eosinophils Relative: 2 %
HCT: 40 % (ref 36.0–46.0)
Hemoglobin: 12.3 g/dL (ref 12.0–15.0)
Immature Granulocytes: 0 %
Lymphocytes Relative: 23 %
Lymphs Abs: 2 10*3/uL (ref 0.7–4.0)
MCH: 27.5 pg (ref 26.0–34.0)
MCHC: 30.8 g/dL (ref 30.0–36.0)
MCV: 89.3 fL (ref 80.0–100.0)
Monocytes Absolute: 0.6 10*3/uL (ref 0.1–1.0)
Monocytes Relative: 6 %
NEUTROS ABS: 6.1 10*3/uL (ref 1.7–7.7)
Neutrophils Relative %: 68 %
Platelet Count: 238 10*3/uL (ref 150–400)
RBC: 4.48 MIL/uL (ref 3.87–5.11)
RDW: 15.5 % (ref 11.5–15.5)
WBC Count: 8.9 10*3/uL (ref 4.0–10.5)
nRBC: 0 % (ref 0.0–0.2)

## 2018-08-28 LAB — CMP (CANCER CENTER ONLY)
ALBUMIN: 3.7 g/dL (ref 3.5–5.0)
ALT: 7 U/L (ref 0–44)
AST: 14 U/L — ABNORMAL LOW (ref 15–41)
Alkaline Phosphatase: 77 U/L (ref 38–126)
Anion gap: 10 (ref 5–15)
BUN: 17 mg/dL (ref 8–23)
CHLORIDE: 101 mmol/L (ref 98–111)
CO2: 29 mmol/L (ref 22–32)
Calcium: 9.8 mg/dL (ref 8.9–10.3)
Creatinine: 1.06 mg/dL — ABNORMAL HIGH (ref 0.44–1.00)
GFR, Est AFR Am: >60 mL/min (ref >60)
GFR, Estimated: 55 mL/min — ABNORMAL LOW (ref >60)
GLUCOSE: 109 mg/dL — AB (ref 70–99)
Potassium: 4 mmol/L (ref 3.5–5.1)
Sodium: 140 mmol/L (ref 135–145)
Total Bilirubin: 0.3 mg/dL (ref 0.3–1.2)
Total Protein: 7.1 g/dL (ref 6.5–8.1)

## 2018-08-28 MED ORDER — PROCHLORPERAZINE MALEATE 10 MG PO TABS: 10.0000 mg | ORAL_TABLET | Freq: Four times a day (QID) | ORAL | 0 refills | Status: DC | PRN

## 2018-08-28 NOTE — Progress Notes (Signed)
[facility] [phone]   [phone]  CONSULT NOTE  REFERRING PHYSICIAN: Dr. Baltazar Apo  REASON FOR CONSULTATION:  68 years old white female recently diagnosed with lung cancer.  HPI [name] is a 68 y.o. female with long history of smoking and past medical history significant for osteoarthritis, chronic back pain, hypertension, dyslipidemia and stroke.  The patient was diagnosed with acute gastrointestinal bleed secondary to taking a lot of Goody's powder for the back and neck pain.  Few days later she had weakness in the left arm and the patient was seen again at the emergency department for evaluation and CT angiogram of the head and neck were performed on June 22, 2018.  Incidentally the scan showed 5.5 cm right paratracheal mass that was indeterminate and suspicious for adenopathy or possibly a primary mediastinal lesion.  This was followed by CT scan of the chest without contrast on June 23, 2018 and that showed 4.0 cm right lower lobe lung mass most consistent with primary lung neoplasm with associated right hilar and mediastinal metastatic adenopathy but no findings for pulmonary metastatic disease or upper abdominal metastasis and no suspicious bone lesions.  The patient was referred to Dr. Lamonte Sakai and repeat so but the CT scan of the chest on August 06, 2018 showed a slight increase in the size of the large right lower lobe lung mass with postobstructive pneumonitis and this remained highly suspicious for primary bronchogenic carcinoma.  There was also right hilar, right paratracheal and right prevascular lymph nodes identified.  They mildly increased in size in the interval.  On August 14, 2018 the patient underwent flexible video fiberoptic bronchoscopy with endobronchial ultrasound and biopsies under the care of Dr. Lamonte Sakai.  The final pathology (SZA [id number]) was consistent with small cell carcinoma. Immunohistochemistry for CK8/18,  Synaptophysin and TTF-1 is diffusely positive. CKAE1AE3 is focally positive. Ki-67 labeling index reaches 70-80%. P63 shows nonspecific staining. CK5/6 and CD45 are negative.  The patient was referred to me today for further evaluation and recommendation regarding treatment of her condition.  She is scheduled to have a PET scan as well as MRI of the brain on 08/30/2018. When seen today she continues to have the pain on the right side of the neck as well as back.  She denied having any chest pain, shortness of breath but has mild cough with no hemoptysis.  She lost around 11 pounds in the last 2 months.  She has no headache or visual changes.  She has no nausea, vomiting, diarrhea or constipation. Family history significant for mother diagnosed with squamous cell carcinoma of the lung.  Father died from myocardial infarction. The patient is single and was accompanied by her partner [name].  She has no children.  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  The patient has a history for smoking 1 pack/day for around 50 years and quit June 19, 2018.  She drinks alcohol probably on a regular basis.  She has no history of drug abuse.  HPI  Past Medical History:  Diagnosis Date  . Arthritis   . Chronic pain   . Hyperlipidemia   . Hypertension   . Stroke ([place])    06/19/2018 minor    Past Surgical History:  Procedure Laterality Date  . BIOPSY  06/19/2018   Procedure: BIOPSY;  Surgeon: Laurence Spates, MD;  Location: [facility];  Service: Endoscopy;;  .  ENDARTERECTOMY Right 06/26/2018   Procedure: ENDARTERECTOMY CAROTID RIGHT;  Surgeon: Serafina Mitchell, MD;  Location: [facility] OR;  Service: Vascular;  Laterality: Right;  . ESOPHAGOGASTRODUODENOSCOPY (EGD) WITH PROPOFOL N/A 06/19/2018   Procedure: ESOPHAGOGASTRODUODENOSCOPY (EGD) WITH PROPOFOL;  Surgeon: Laurence Spates, MD;  Location: [facility];  Service: Endoscopy;  Laterality: N/A;  .  PATCH ANGIOPLASTY Right 06/26/2018   Procedure: PATCH ANGIOPLASTY USING A XENOSURE 1cm x 6cm BIOLOGIC PATCH;  Surgeon: Serafina Mitchell, MD;  Location: [facility];  Service: Vascular;  Laterality: Right;  Marland Kitchen VIDEO BRONCHOSCOPY WITH ENDOBRONCHIAL ULTRASOUND N/A 08/14/2018   Procedure: VIDEO BRONCHOSCOPY WITH ENDOBRONCHIAL ULTRASOUND;  Surgeon: Collene Gobble, MD;  Location: [facility] OR;  Service: Thoracic;  Laterality: N/A;    Family History  Problem Relation Age of Onset  . Cancer Mother        lung cancer  . Diabetes Other   . Hyperlipidemia Other   . Hypertension Other   . Cancer Other     Social History Social History   Tobacco Use  . Smoking status: Former Smoker    Packs/day: 1.00    Years: 10.00    Pack years: 10.00    Types: Cigarettes    Last attempt to quit: 06/19/2018    Years since quitting: 0.1  . Smokeless tobacco: Never Used  . Tobacco comment: Currently using Nicorette Gum  Substance Use Topics  . Alcohol use: Yes    Comment: wine 2 glasses a day   . Drug use: Never    Comment: Hemp Oil    No Known Allergies  Current Outpatient Medications  Medication Sig Dispense Refill  . aspirin EC 325 MG EC tablet Take 1 tablet (325 mg total) by mouth daily. STOP 12/15 FOR BRONCH AND BIOPSY 12/18.Marland KitchenRESUME AFTER BIOPSY 30 tablet 0  . atorvastatin (LIPITOR) 80 MG tablet Take 1 tablet (80 mg total) by mouth daily at 6 PM. 90 tablet 3  . chlorthalidone (HYGROTON) 25 MG tablet Take 1 tablet (25 mg total) by mouth daily. 90 tablet 0  . Ferrous Gluconate-C-Folic Acid (IRON-C PO) Take 65 mg by mouth every other day.    . lisinopril (PRINIVIL,ZESTRIL) 40 MG tablet Take 1 tablet (40 mg total) by mouth daily. 90 tablet 0  . oxyCODONE (OXY IR/ROXICODONE) 5 MG immediate release tablet Take 1-2 tablets (5-10 mg total) by mouth every 4 (four) hours as needed for moderate pain. 30 tablet 0  . pantoprazole (PROTONIX) 40 MG tablet Take 1 tablet (40 mg total) by mouth 2 (two) times daily before a  meal. 60 tablet 3  . acetaminophen (TYLENOL) 650 MG CR tablet Take 650 mg by mouth every 8 (eight) hours as needed for pain.     No current facility-administered medications for this visit.     Review of Systems  Constitutional: positive for fatigue Eyes: negative Ears, nose, mouth, throat, and face: negative Respiratory: positive for cough Cardiovascular: negative Gastrointestinal: negative Genitourinary:negative Integument/breast: negative Hematologic/lymphatic: negative Musculoskeletal:positive for back pain Neurological: negative Behavioral/Psych: negative Endocrine: negative Allergic/Immunologic: negative  Physical Exam  SHF:WYOVZ, healthy, no distress, well nourished and well developed SKIN: skin color, texture, turgor are normal, no rashes or significant lesions HEAD: Normocephalic, No masses, lesions, tenderness or abnormalities EYES: normal, PERRLA, Conjunctiva are pink and non-injected EARS: External ears normal, Canals clear OROPHARYNX:no exudate, no erythema and lips, buccal mucosa, and tongue normal  NECK: supple, no adenopathy, no bruits LYMPH:  no palpable lymphadenopathy, no hepatosplenomegaly BREAST:not examined LUNGS: clear to auscultation , and  palpation HEART: regular rate & rhythm, no murmurs and no gallops ABDOMEN:abdomen soft, non-tender, normal bowel sounds and no masses or organomegaly BACK: Back symmetric, no curvature., No CVA tenderness EXTREMITIES:no joint deformities, effusion, or inflammation, no edema  NEURO: alert & oriented x 3 with fluent speech, no focal motor/sensory deficits  PERFORMANCE STATUS: ECOG 1  LABORATORY DATA: Lab Results  Component Value Date   WBC 8.9 08/28/2018   HGB 12.3 08/28/2018   HCT 40.0 08/28/2018   MCV 89.3 08/28/2018   PLT 238 08/28/2018      Chemistry      Component Value Date/Time   NA 140 08/28/2018 1340   NA 142 08/02/2018 1042   K 4.0 08/28/2018 1340   CL 101 08/28/2018 1340   CO2 29  08/28/2018 1340   BUN 17 08/28/2018 1340   BUN 9 08/02/2018 1042   CREATININE 1.06 (H) 08/28/2018 1340      Component Value Date/Time   CALCIUM 9.8 08/28/2018 1340   ALKPHOS 77 08/28/2018 1340   AST 14 (L) 08/28/2018 1340   ALT 7 08/28/2018 1340   BILITOT 0.3 08/28/2018 1340       RADIOGRAPHIC STUDIES: Ct Super D Chest Wo Contrast  Result Date: 08/09/2018 CLINICAL DATA:  Right lower lobe lung mass EXAM: CT CHEST WITHOUT CONTRAST TECHNIQUE: Multidetector CT imaging of the chest was performed using thin slice collimation for electromagnetic bronchoscopy planning purposes, without intravenous contrast. COMPARISON:  06/23/2018 FINDINGS: Cardiovascular: Normal heart size. There is no pericardial effusion. Aortic atherosclerosis noted. Increase caliber of the transverse aortic arch measures 3.4 cm. Mediastinum/Nodes: Normal appearance of the thyroid gland. The trachea appears patent and is midline. Normal appearance of the esophagus. Right paratracheal adenopathy is again noted. Index lymph node measures 5 cm, image 40/2. Unchanged from previous exam. Index pre caval lymph node within the anterior mediastinum measures 2.1 cm, image 39/2. Previously 1.6 cm. Index subcarinal lymph node measures 2.3 cm, image 56/2. Previously 2.1 cm. Lungs/Pleura: Right hilar lymph node measures 3.2 cm, image 64/3. Previously 3.1 cm. The anterior right lower lobe perifissural lung mass measures 5.9 by 4.3 by 3.8 cm, image 37/6. Previously this measured 5.6 by 4.1 by 3.1 cm. Associated postobstructive atelectasis and pneumonitis identified within the posterior right lower lobe. Small peripheral lung nodules within the right upper lobe are again noted. Index nodule within the lateral right upper lobe measures 4 mm, image 56/3. Unchanged. Upper Abdomen: Indeterminate left adrenal nodule is identified measuring 1.1 cm, image 136/2. Stable from previous exam. No acute findings within the upper abdomen. Musculoskeletal: No chest  wall mass or suspicious bone lesions identified. IMPRESSION: 1. Slight increase in size of large right lower lobe lung mass with postobstructive pneumonitis. This remains highly suspicious for primary bronchogenic carcinoma. 2. Right hilar, right paratracheal, and right pre-vascular lymph nodes are identified. The right pre-vascular and right hilar lymph nodes are mildly increased in size in the interval. Electronically Signed   By: Kerby Moors M.D.   On: 08/09/2018 14:24    ASSESSMENT: This is a very pleasant 67 years old white female recently diagnosed with limited stage (T2b, N2, M0) small cell lung cancer, pending further staging work-up diagnosed and January 2020.  She presented with right lower lobe lung mass in addition to right hilar and mediastinal lymphadenopathy.   PLAN: I had a lengthy discussion with the patient and her boyfriend today about her current disease stage, prognosis and treatment options. I personally and independently reviewed the scan images and  discussed the result and showed the images to the patient today. I recommended for the patient to complete the staging work-up and she is scheduled to have a PET scan as well as MRI of the brain later this week. I discussed with the patient her treatment options and if there is no evidence for metastatic disease outside the already known areas of lesions in her lung, I would consider her for a course of systemic chemotherapy concurrent with radiation. Her chemotherapy will be in the form of cisplatin 80 mg/M2 on day 1 and etoposide 100 mg/M2 on days 1, 2 and 3 every 3 weeks. I discussed with the patient the adverse effect of the chemotherapy including but not limited to alopecia, myelosuppression, nausea and vomiting, peripheral neuropathy, liver or renal dysfunction as well as hearing deficit. She is expected to start the first cycle of this treatment next week. I will refer the patient to radiation oncology for evaluation and  discussion of the radiotherapy option. We will see the patient back for follow-up visit in 2 weeks for evaluation and management of any adverse effect of her treatment. We will arrange for the patient to have a chemotherapy education class before the first dose of her treatment. I will call her pharmacy with prescription for Compazine 10 mg p.o. every 6 hours as needed for nausea. The patient was advised to call immediately if she has any other concerning symptoms in the interval. The patient voices understanding of current disease status and treatment options and is in agreement with the current care plan.  All questions were answered. The patient knows to call the clinic with any problems, questions or concerns. We can certainly see the patient much sooner if necessary.  Thank you so much for allowing me to participate in the care of [name]. I will continue to follow up the patient with you and assist in her care.  I spent 55 minutes counseling the patient face to face. The total time spent in the appointment was 80 minutes.  Disclaimer: This note was dictated with voice recognition software. Similar sounding words can inadvertently be transcribed and may not be corrected upon review.   Eilleen Kempf August 28, 2018, 2:40 PM

## 2018-08-28 NOTE — Progress Notes (Signed)
Oncology Nurse Navigator Documentation  Oncology Nurse Navigator Flowsheets 08/28/2018  Navigator Location [facility]  Navigator Encounter Type Clinic/MDC/Ms. Aronoff is a new patient DX with small cell lung cancer.  Barriers identified: Education-I gave and explained information on DX and treatment plan.  Patient was confused on location of upcoming scans.  I updated her on locations.  Patient has great support with her partner.  He took notes as a reference to look back on to help with understanding of treatment and next steps.    Abnormal Finding Date 06/22/2018  Confirmed Diagnosis Date 08/14/2018  Patient Visit Type MedOnc  Treatment Phase Pre-Tx/Tx Discussion  Barriers/Navigation Needs Education;Coordination of Care  Education Newly Diagnosed Cancer Education;Other  Interventions Coordination of Care;Education  Coordination of Care Other  Education Method Verbal  Acuity Level 2  Time Spent with Patient 30

## 2018-08-28 NOTE — Progress Notes (Signed)
START ON PATHWAY REGIMEN - Small Cell Lung     A cycle is every 21 days:     Etoposide      Cisplatin   **Always confirm dose/schedule in your pharmacy ordering system**  Patient Characteristics: Newly Diagnosed, Preoperative or Nonsurgical Candidate (Clinical Staging), First Line, Limited Stage, Nonsurgical Candidate Therapeutic Status: Newly Diagnosed, Preoperative or Nonsurgical Candidate (Clinical Staging) AJCC T Category: cT2b AJCC N Category: cN2 AJCC M Category: cM0 AJCC 8 Stage Grouping: IIIA Stage Classification: Limited Surgical Candidacy: Nonsurgical Candidate Intent of Therapy: Curative Intent, Discussed with Patient

## 2018-08-29 ENCOUNTER — Telehealth: Payer: Self-pay

## 2018-08-29 NOTE — Telephone Encounter (Signed)
Printed avs and calender of upcoming appointment. Added infusion to the BOOK for 2/18. Per 2/12 los requested chemo edu. To print calender of additional appointments that was added

## 2018-08-30 ENCOUNTER — Telehealth: Payer: Self-pay | Admitting: Emergency Medicine

## 2018-08-30 ENCOUNTER — Ambulatory Visit (HOSPITAL_COMMUNITY)
Admission: RE | Admit: 2018-08-30 | Discharge: 2018-08-30 | Disposition: A | Discharge status: Home / Self Care | Payer: Medicare Other | Source: Ambulatory Visit | Attending: Emergency Medicine | Admitting: Emergency Medicine

## 2018-08-30 ENCOUNTER — Encounter (HOSPITAL_COMMUNITY)
Admission: RE | Admit: 2018-08-30 | Discharge: 2018-08-30 | Disposition: A | Discharge status: Home / Self Care | Payer: Medicare Other | Source: Ambulatory Visit | Attending: Emergency Medicine | Admitting: Emergency Medicine

## 2018-08-30 DIAGNOSIS — C3490 Malignant neoplasm of unspecified part of unspecified bronchus or lung: Secondary | ICD-10-CM

## 2018-08-30 LAB — GLUCOSE, CAPILLARY: GLUCOSE-CAPILLARY: 123 mg/dL — AB (ref 70–99)

## 2018-08-30 MED ORDER — FLUDEOXYGLUCOSE F - 18 (FDG) INJECTION
10.1800 | Freq: Once | INTRAVENOUS | Status: AC | PRN
  Administered 2018-08-30: 10.18 via INTRAVENOUS

## 2018-08-30 MED ORDER — GADOBUTROL 1 MMOL/ML IV SOLN
10.0000 mL | Freq: Once | INTRAVENOUS | Status: AC | PRN
  Administered 2018-08-30: 10 mL via INTRAVENOUS

## 2018-08-30 NOTE — Telephone Encounter (Signed)
Routing to [place] for review.

## 2018-08-30 NOTE — Telephone Encounter (Signed)
Noted.  This is a PET scan based off of staging work-up for lung cancer.  Patient is actively seeing oncology.  You can route to Dr. Lamonte Sakai for review.  No changes in patient's plan of care at this time.  Needs to keep follow-up with oncology on 09/02/2018 and per last oncology note start planned treatments next week.  Wyn Quaker, FNP

## 2018-08-30 NOTE — Telephone Encounter (Signed)
Received call report from [facility] with [place] [facility] on patient's PED done on today. please review the result/impression copied below: IMPRESSION: 1. Intensely hypermetabolic RIGHT lower lobe mass consistent with BRONCHOGENIC CARCINOMA. Mass measures 4 cm but does have larger contiguous extension into the RIGHT hilum. 2. Hypermetabolic RIGHT hilar metastasis, bulky RIGHT paratracheal nodal metastasis and subcarinal nodal metastasis. No supraclavicular nodal metastasis or contralateral nodal metastasis. Staging by FDG PET scan T2a N2 m0. 3. Intense hypermetabolic activity in the distal anal canal. Recommend direct physical exam/optical exam to evaluate for ANAL CARCINOMA.  These results will be called to the ordering clinician or representative by the Radiologist Assistant, and communication documented in the PACS or zVision Dashboard.   Electronically Signed   By: Suzy Bouchard M.D.   On: 08/30/2018 11:58  Please advise, thank you. RB is out of the office today, routing to him and B.Mack APP of the morning for review.

## 2018-09-02 ENCOUNTER — Inpatient Hospital Stay: Payer: Medicare Other

## 2018-09-02 ENCOUNTER — Encounter: Payer: Self-pay | Admitting: Internal Medicine

## 2018-09-02 ENCOUNTER — Telehealth: Payer: Self-pay

## 2018-09-02 DIAGNOSIS — C3431 Malignant neoplasm of lower lobe, right bronchus or lung: Secondary | ICD-10-CM

## 2018-09-02 LAB — CMP (CANCER CENTER ONLY)
ALT: 9 U/L (ref 0–44)
ANION GAP: 11 (ref 5–15)
AST: 14 U/L — ABNORMAL LOW (ref 15–41)
Albumin: 3.7 g/dL (ref 3.5–5.0)
Alkaline Phosphatase: 77 U/L (ref 38–126)
BUN: 14 mg/dL (ref 8–23)
CO2: 28 mmol/L (ref 22–32)
Calcium: 9.5 mg/dL (ref 8.9–10.3)
Chloride: 101 mmol/L (ref 98–111)
Creatinine: 1.01 mg/dL — ABNORMAL HIGH (ref 0.44–1.00)
GFR, Est AFR Am: >60 mL/min (ref >60)
GFR, Estimated: 58 mL/min — ABNORMAL LOW (ref >60)
Glucose, Bld: 104 mg/dL — ABNORMAL HIGH (ref 70–99)
Potassium: 3.8 mmol/L (ref 3.5–5.1)
Sodium: 140 mmol/L (ref 135–145)
TOTAL PROTEIN: 7.1 g/dL (ref 6.5–8.1)
Total Bilirubin: 0.4 mg/dL (ref 0.3–1.2)

## 2018-09-02 LAB — CBC WITH DIFFERENTIAL (CANCER CENTER ONLY)
Abs Immature Granulocytes: 0.02 10*3/uL (ref 0.00–0.07)
BASOS ABS: 0 10*3/uL (ref 0.0–0.1)
Basophils Relative: 1 %
EOS PCT: 3 %
Eosinophils Absolute: 0.2 10*3/uL (ref 0.0–0.5)
HCT: 40.2 % (ref 36.0–46.0)
Hemoglobin: 12.2 g/dL (ref 12.0–15.0)
Immature Granulocytes: 0 %
Lymphocytes Relative: 21 %
Lymphs Abs: 1.7 10*3/uL (ref 0.7–4.0)
MCH: 27.1 pg (ref 26.0–34.0)
MCHC: 30.3 g/dL (ref 30.0–36.0)
MCV: 89.3 fL (ref 80.0–100.0)
Monocytes Absolute: 0.6 10*3/uL (ref 0.1–1.0)
Monocytes Relative: 7 %
NRBC: 0 % (ref 0.0–0.2)
Neutro Abs: 5.4 10*3/uL (ref 1.7–7.7)
Neutrophils Relative %: 68 %
Platelet Count: 233 10*3/uL (ref 150–400)
RBC: 4.5 MIL/uL (ref 3.87–5.11)
RDW: 15.9 % — AB (ref 11.5–15.5)
WBC Count: 8 10*3/uL (ref 4.0–10.5)

## 2018-09-02 LAB — MAGNESIUM: Magnesium: 1.6 mg/dL — ABNORMAL LOW (ref 1.7–2.4)

## 2018-09-02 NOTE — Telephone Encounter (Signed)
Spoke with patient com[ncerning her upcomming appointment. Per 2/12 los BOOK VERIFICATION OF APPOINTMENT

## 2018-09-02 NOTE — Telephone Encounter (Signed)
Thank you :)

## 2018-09-02 NOTE — Progress Notes (Signed)
Met with patient and family member to introduce myself as Arboriculturist and to offer available resources. There is no copay assistance for her diagnosis.  Discussed one-time $53 Engineer, drilling to assist with personal household expenses. Patient states her household is over the income for the grant based on guidelines provided.  Gave her my card for any additional financial questions or concerns. She verbalized understanding.

## 2018-09-03 ENCOUNTER — Inpatient Hospital Stay: Payer: Medicare Other

## 2018-09-03 VITALS — BP 138/92 | HR 78 | Temp 97.9°F | Resp 20 | Wt 199.5 lb

## 2018-09-03 DIAGNOSIS — C3431 Malignant neoplasm of lower lobe, right bronchus or lung: Secondary | ICD-10-CM | POA: Diagnosis not present

## 2018-09-03 MED ORDER — SODIUM CHLORIDE 0.9 % IV SOLN
97.0000 mg/m2 | Freq: Once | INTRAVENOUS | Status: AC
  Administered 2018-09-03: 200 mg via INTRAVENOUS
  Filled 2018-09-03: qty 10

## 2018-09-03 MED ORDER — SODIUM CHLORIDE 0.9 % IV SOLN
Freq: Once | INTRAVENOUS | Status: AC
  Administered 2018-09-03: 09:00:00 via INTRAVENOUS
  Filled 2018-09-03: qty 250

## 2018-09-03 MED ORDER — POTASSIUM CHLORIDE 2 MEQ/ML IV SOLN
Freq: Once | INTRAVENOUS | Status: AC
  Administered 2018-09-03: 09:00:00 via INTRAVENOUS
  Filled 2018-09-03: qty 10

## 2018-09-03 MED ORDER — SODIUM CHLORIDE 0.9 % IV SOLN
80.0000 mg/m2 | Freq: Once | INTRAVENOUS | Status: AC
  Administered 2018-09-03: 165 mg via INTRAVENOUS
  Filled 2018-09-03: qty 165

## 2018-09-03 MED ORDER — SODIUM CHLORIDE 0.9 % IV SOLN
Freq: Once | INTRAVENOUS | Status: AC
  Administered 2018-09-03: 11:00:00 via INTRAVENOUS
  Filled 2018-09-03: qty 5

## 2018-09-03 MED ORDER — PALONOSETRON HCL INJECTION 0.25 MG/5ML
INTRAVENOUS | Status: AC
  Filled 2018-09-03: qty 5

## 2018-09-03 MED ORDER — PALONOSETRON HCL INJECTION 0.25 MG/5ML
0.2500 mg | Freq: Once | INTRAVENOUS | Status: AC
  Administered 2018-09-03: 0.25 mg via INTRAVENOUS

## 2018-09-03 NOTE — Patient Instructions (Signed)
[facility] Discharge Instructions for Patients Receiving Chemotherapy  Today you received the following chemotherapy agents Cisplatin (PLATINOL) & Etoposide (VEPESID).  To help prevent nausea and vomiting after your treatment, we encourage you to take your nausea medication as prescribed. DO NOT TAKE ZOFRAN FOR THE NEXT 3 DAYS, TAKE COMPAZINE.   If you develop nausea and vomiting that is not controlled by your nausea medication, call the clinic.   BELOW ARE SYMPTOMS THAT SHOULD BE REPORTED IMMEDIATELY:  *FEVER GREATER THAN 100.5 F  *CHILLS WITH OR WITHOUT FEVER  NAUSEA AND VOMITING THAT IS NOT CONTROLLED WITH YOUR NAUSEA MEDICATION  *UNUSUAL SHORTNESS OF BREATH  *UNUSUAL BRUISING OR BLEEDING  TENDERNESS IN MOUTH AND THROAT WITH OR WITHOUT PRESENCE OF ULCERS  *URINARY PROBLEMS  *BOWEL PROBLEMS  UNUSUAL RASH Items with * indicate a potential emergency and should be followed up as soon as possible.  Feel free to call the clinic should you have any questions or concerns. The clinic phone number is [phone].  Please show the [facility] at check-in to the Emergency Department and triage nurse.  Cisplatin injection What is this medicine? CISPLATIN (SIS pla tin) is a chemotherapy drug. It targets fast dividing cells, like cancer cells, and causes these cells to die. This medicine is used to treat many types of cancer like bladder, ovarian, and testicular cancers. This medicine may be used for other purposes; ask your health care provider or pharmacist if you have questions. COMMON BRAND NAME(S): Platinol, Platinol -AQ What should I tell my health care provider before I take this medicine? They need to know if you have any of these conditions: -blood disorders -hearing problems -kidney disease -recent or ongoing radiation therapy -an unusual or allergic reaction to cisplatin, carboplatin, other chemotherapy, other medicines, foods, dyes, or  preservatives -pregnant or trying to get pregnant -breast-feeding How should I use this medicine? This drug is given as an infusion into a vein. It is administered in a hospital or clinic by a specially trained health care professional. Talk to your pediatrician regarding the use of this medicine in children. Special care may be needed. Overdosage: If you think you have taken too much of this medicine contact a poison control center or emergency room at once. NOTE: This medicine is only for you. Do not share this medicine with others. What if I miss a dose? It is important not to miss a dose. Call your doctor or health care professional if you are unable to keep an appointment. What may interact with this medicine? -dofetilide -foscarnet -medicines for seizures -medicines to increase blood counts like filgrastim, pegfilgrastim, sargramostim -probenecid -pyridoxine used with altretamine -rituximab -some antibiotics like amikacin, gentamicin, neomycin, polymyxin B, streptomycin, tobramycin -sulfinpyrazone -vaccines -zalcitabine Talk to your doctor or health care professional before taking any of these medicines: -acetaminophen -aspirin -ibuprofen -ketoprofen -naproxen This list may not describe all possible interactions. Give your health care provider a list of all the medicines, herbs, non-prescription drugs, or dietary supplements you use. Also tell them if you smoke, drink alcohol, or use illegal drugs. Some items may interact with your medicine. What should I watch for while using this medicine? Your condition will be monitored carefully while you are receiving this medicine. You will need important blood work done while you are taking this medicine. This drug may make you feel generally unwell. This is not uncommon, as chemotherapy can affect healthy cells as well as cancer cells. Report any side effects. Continue your  course of treatment even though you feel ill unless your doctor  tells you to stop. In some cases, you may be given additional medicines to help with side effects. Follow all directions for their use. Call your doctor or health care professional for advice if you get a fever, chills or sore throat, or other symptoms of a cold or flu. Do not treat yourself. This drug decreases your body's ability to fight infections. Try to avoid being around people who are sick. This medicine may increase your risk to bruise or bleed. Call your doctor or health care professional if you notice any unusual bleeding. Be careful brushing and flossing your teeth or using a toothpick because you may get an infection or bleed more easily. If you have any dental work done, tell your dentist you are receiving this medicine. Avoid taking products that contain aspirin, acetaminophen, ibuprofen, naproxen, or ketoprofen unless instructed by your doctor. These medicines may hide a fever. Do not become pregnant while taking this medicine. Women should inform their doctor if they wish to become pregnant or think they might be pregnant. There is a potential for serious side effects to an unborn child. Talk to your health care professional or pharmacist for more information. Do not breast-feed an infant while taking this medicine. Drink fluids as directed while you are taking this medicine. This will help protect your kidneys. Call your doctor or health care professional if you get diarrhea. Do not treat yourself. What side effects may I notice from receiving this medicine? Side effects that you should report to your doctor or health care professional as soon as possible: -allergic reactions like skin rash, itching or hives, swelling of the face, lips, or tongue -signs of infection - fever or chills, cough, sore throat, pain or difficulty passing urine -signs of decreased platelets or bleeding - bruising, pinpoint red spots on the skin, black, tarry stools, nosebleeds -signs of decreased red blood  cells - unusually weak or tired, fainting spells, lightheadedness -breathing problems -changes in hearing -gout pain -low blood counts - This drug may decrease the number of white blood cells, red blood cells and platelets. You may be at increased risk for infections and bleeding. -nausea and vomiting -pain, swelling, redness or irritation at the injection site -pain, tingling, numbness in the hands or feet -problems with balance, movement -trouble passing urine or change in the amount of urine Side effects that usually do not require medical attention (report to your doctor or health care professional if they continue or are bothersome): -changes in vision -loss of appetite -metallic taste in the mouth or changes in taste This list may not describe all possible side effects. Call your doctor for medical advice about side effects. You may report side effects to FDA at 1-800-FDA-1088. Where should I keep my medicine? This drug is given in a hospital or clinic and will not be stored at home. NOTE: This sheet is a summary. It may not cover all possible information. If you have questions about this medicine, talk to your doctor, pharmacist, or health care provider.  2019 Elsevier/Gold Standard (2007-10-08 14:40:54)  Etoposide, VP-16 capsules What is this medicine? ETOPOSIDE, VP-16 (e toe POE side) is a chemotherapy drug. It is used to treat small cell lung cancer and other cancers. This medicine may be used for other purposes; ask your health care provider or pharmacist if you have questions. COMMON BRAND NAME(S): VePesid What should I tell my health care provider before I take this  medicine? They need to know if you have any of these conditions: -infection -kidney disease -liver disease -low blood counts, like low white cell, platelet, or red cell counts -an unusual or allergic reaction to etoposide, other medicines, foods, dyes, or preservatives -pregnant or trying to get  pregnant -breast-feeding How should I use this medicine? Take this medicine by mouth with a glass of water. Follow the directions on the prescription label. Do not open, crush, or chew the capsules. It is advisable to wear gloves when handling this medicine. Take your medicine at regular intervals. Do not take it more often than directed. Do not stop taking except on your doctor's advice. Talk to your pediatrician regarding the use of this medicine in children. Special care may be needed. Overdosage: If you think you have taken too much of this medicine contact a poison control center or emergency room at once. NOTE: This medicine is only for you. Do not share this medicine with others. What if I miss a dose? If you miss a dose, take it as soon as you can. If it is almost time for your next dose, take only that dose. Do not take double or extra doses. What may interact with this medicine? -aspirin -certain medications for seizures like carbamazepine, phenobarbital, phenytoin, valproic acid -cyclosporine -levamisole -valproic acid -warfarin This list may not describe all possible interactions. Give your health care provider a list of all the medicines, herbs, non-prescription drugs, or dietary supplements you use. Also tell them if you smoke, drink alcohol, or use illegal drugs. Some items may interact with your medicine. What should I watch for while using this medicine? Visit your doctor for checks on your progress. This drug may make you feel generally unwell. This is not uncommon, as chemotherapy can affect healthy cells as well as cancer cells. Report any side effects. Continue your course of treatment even though you feel ill unless your doctor tells you to stop. In some cases, you may be given additional medicines to help with side effects. Follow all directions for their use. Call your doctor or health care professional for advice if you get a fever, chills or sore throat, or other  symptoms of a cold or flu. Do not treat yourself. This drug decreases your body's ability to fight infections. Try to avoid being around people who are sick. This medicine may increase your risk to bruise or bleed. Call your doctor or health care professional if you notice any unusual bleeding. Talk to your doctor about your risk of cancer. You may be more at risk for certain types of cancers if you take this medicine. Do not become pregnant while taking this medicine or for at least 6 months after stopping it. Women should inform their doctor if they wish to become pregnant or think they might be pregnant. Women of child-bearing potential will need to have a negative pregnancy test before starting this medicine. There is a potential for serious side effects to an unborn child. Talk to your health care professional or pharmacist for more information. Do not breast-feed an infant while taking this medicine. Men must use a latex condom during sexual contact with a woman while taking this medicine and for at least 4 months after stopping it. A latex condom is needed even if you have had a vasectomy. Contact your doctor right away if your partner becomes pregnant. Do not donate sperm while taking this medicine and for 4 months after you stop taking this medicine. Men should  inform their doctors if they wish to father a child. This medicine may lower sperm counts. What side effects may I notice from receiving this medicine? Side effects that you should report to your doctor or health care professional as soon as possible: -allergic reactions like skin rash, itching or hives, swelling of the face, lips, or tongue -low blood counts - this medicine may decrease the number of white blood cells, red blood cells and platelets. You may be at increased risk for infections and bleeding. -signs of infection - fever or chills, cough, sore throat, pain or difficulty passing urine -signs of decreased platelets or bleeding -  bruising, pinpoint red spots on the skin, black, tarry stools, blood in the urine -signs of decreased red blood cells - unusually weak or tired, fainting spells, lightheadedness -breathing problems -changes in vision -mouth or throat sores or ulcers -pain, tingling, numbness in the hands or feet -redness, blistering, peeling or loosening of the skin, including inside the mouth -seizures -vomiting Side effects that usually do not require medical attention (report to your doctor or health care professional if they continue or are bothersome): -change in taste -diarrhea -hair loss -nausea -stomach pain This list may not describe all possible side effects. Call your doctor for medical advice about side effects. You may report side effects to FDA at 1-800-FDA-1088. Where should I keep my medicine? Keep out of the reach of children. Store in a refrigerator between 2 and 8 degrees C (36 and 46 degrees F). Do not freeze. Throw away any unused medicine after the expiration date. NOTE: This sheet is a summary. It may not cover all possible information. If you have questions about this medicine, talk to your doctor, pharmacist, or health care provider.  2019 Elsevier/Gold Standard (2015-06-25 11:49:52)

## 2018-09-04 ENCOUNTER — Other Ambulatory Visit: Payer: Self-pay | Admitting: Medical Oncology

## 2018-09-04 ENCOUNTER — Inpatient Hospital Stay: Payer: Medicare Other

## 2018-09-04 VITALS — BP 156/90 | HR 66 | Temp 98.6°F

## 2018-09-04 DIAGNOSIS — I878 Other specified disorders of veins: Secondary | ICD-10-CM

## 2018-09-04 DIAGNOSIS — C3431 Malignant neoplasm of lower lobe, right bronchus or lung: Secondary | ICD-10-CM

## 2018-09-04 MED ORDER — DEXAMETHASONE SODIUM PHOSPHATE 10 MG/ML IJ SOLN
INTRAMUSCULAR | Status: AC
  Filled 2018-09-04: qty 1

## 2018-09-04 MED ORDER — SODIUM CHLORIDE 0.9 % IV SOLN: 10.0000 mg | Freq: Once | INTRAVENOUS | Status: DC

## 2018-09-04 MED ORDER — SODIUM CHLORIDE 0.9 % IV SOLN
Freq: Once | INTRAVENOUS | Status: AC
  Administered 2018-09-04: 15:00:00 via INTRAVENOUS
  Filled 2018-09-04: qty 250

## 2018-09-04 MED ORDER — SODIUM CHLORIDE 0.9 % IV SOLN
95.0000 mg/m2 | Freq: Once | INTRAVENOUS | Status: AC
  Administered 2018-09-04: 200 mg via INTRAVENOUS
  Filled 2018-09-04: qty 10

## 2018-09-04 MED ORDER — DEXAMETHASONE SODIUM PHOSPHATE 10 MG/ML IJ SOLN
10.0000 mg | Freq: Once | INTRAMUSCULAR | Status: AC
  Administered 2018-09-04: 10 mg via INTRAVENOUS

## 2018-09-04 NOTE — Patient Instructions (Signed)
[facility] Discharge Instructions for Patients Receiving Chemotherapy  Today you received the following chemotherapy agents:  Etoposide  To help prevent nausea and vomiting after your treatment, we encourage you to take your nausea medication as prescribed.    If you develop nausea and vomiting that is not controlled by your nausea medication, call the clinic.   BELOW ARE SYMPTOMS THAT SHOULD BE REPORTED IMMEDIATELY:  *FEVER GREATER THAN 100.5 F  *CHILLS WITH OR WITHOUT FEVER  NAUSEA AND VOMITING THAT IS NOT CONTROLLED WITH YOUR NAUSEA MEDICATION  *UNUSUAL SHORTNESS OF BREATH  *UNUSUAL BRUISING OR BLEEDING  TENDERNESS IN MOUTH AND THROAT WITH OR WITHOUT PRESENCE OF ULCERS  *URINARY PROBLEMS  *BOWEL PROBLEMS  UNUSUAL RASH Items with * indicate a potential emergency and should be followed up as soon as possible.  Feel free to call the clinic should you have any questions or concerns. The clinic phone number is [phone].  Please show the [facility] at check-in to the Emergency Department and triage nurse.

## 2018-09-04 NOTE — Progress Notes (Signed)
BP elevated- Per Dr Julien Nordmann it is okay to treat pt today with vp-16 and bp 157/94.

## 2018-09-05 ENCOUNTER — Inpatient Hospital Stay: Payer: Medicare Other

## 2018-09-05 VITALS — BP 160/89 | HR 61 | Temp 98.1°F

## 2018-09-05 DIAGNOSIS — C3431 Malignant neoplasm of lower lobe, right bronchus or lung: Secondary | ICD-10-CM | POA: Diagnosis not present

## 2018-09-05 MED ORDER — DEXAMETHASONE SODIUM PHOSPHATE 10 MG/ML IJ SOLN
INTRAMUSCULAR | Status: AC
  Filled 2018-09-05: qty 1

## 2018-09-05 MED ORDER — SODIUM CHLORIDE 0.9 % IV SOLN
95.0000 mg/m2 | Freq: Once | INTRAVENOUS | Status: AC
  Administered 2018-09-05: 200 mg via INTRAVENOUS
  Filled 2018-09-05: qty 10

## 2018-09-05 MED ORDER — SODIUM CHLORIDE 0.9 % IV SOLN
Freq: Once | INTRAVENOUS | Status: AC
  Administered 2018-09-05: 08:00:00 via INTRAVENOUS
  Filled 2018-09-05: qty 250

## 2018-09-05 MED ORDER — DEXAMETHASONE SODIUM PHOSPHATE 10 MG/ML IJ SOLN
10.0000 mg | Freq: Once | INTRAMUSCULAR | Status: AC
  Administered 2018-09-05: 10 mg via INTRAVENOUS

## 2018-09-05 MED ORDER — SODIUM CHLORIDE 0.9 % IV SOLN: 10.0000 mg | Freq: Once | INTRAVENOUS | Status: DC

## 2018-09-05 NOTE — Patient Instructions (Signed)
[facility] Discharge Instructions for Patients Receiving Chemotherapy  Today you received the following chemotherapy agents: Etoposide.  To help prevent nausea and vomiting after your treatment, we encourage you to take your nausea medication as directed.   If you develop nausea and vomiting that is not controlled by your nausea medication, call the clinic.   BELOW ARE SYMPTOMS THAT SHOULD BE REPORTED IMMEDIATELY:  *FEVER GREATER THAN 100.5 F  *CHILLS WITH OR WITHOUT FEVER  NAUSEA AND VOMITING THAT IS NOT CONTROLLED WITH YOUR NAUSEA MEDICATION  *UNUSUAL SHORTNESS OF BREATH  *UNUSUAL BRUISING OR BLEEDING  TENDERNESS IN MOUTH AND THROAT WITH OR WITHOUT PRESENCE OF ULCERS  *URINARY PROBLEMS  *BOWEL PROBLEMS  UNUSUAL RASH Items with * indicate a potential emergency and should be followed up as soon as possible.  Feel free to call the clinic should you have any questions or concerns. The clinic phone number is [phone].  Please show the [facility] at check-in to the Emergency Department and triage nurse.

## 2018-09-06 ENCOUNTER — Telehealth: Payer: Self-pay | Admitting: *Deleted

## 2018-09-06 NOTE — Telephone Encounter (Signed)
-----   Message from Georgianne Fick, RN sent at 09/03/2018  4:21 PM EST ----- Regarding: Dr. Julien Nordmann first time Cisplatin & Etoposide Patient received first time Cisplatin & Etoposide and tolerated these well.

## 2018-09-06 NOTE — Telephone Encounter (Signed)
[facility] for chemotherapy F/U.  Patient is doing well.  Denies n/v or any new side effects or symptoms.  "I've only taken one nausea pill.  Bowels changing from daily to every other day and have moved today.  Bladder functioning well.  Appetite not like it was but I'm still eating.  Dominica Severin made a Whey protein shake.  I have ensure, V8 juice and drink lots of water.  Sleeping well so far.  No new pain.  Have chronic right side pain ever since I was younger."  Instructed to drink 64 oz minimum daily or at least the day before, of and after treatment.  Asked if she should hold aspirin two days before port-a-cath placement scheduled 09-19-2018.  Denies further questions or needs at this time.  Reviewed calling office [phone] during and after hours as needed for symptoms, changes or event outside office hours.

## 2018-09-11 ENCOUNTER — Inpatient Hospital Stay: Payer: Medicare Other | Admitting: Physician Assistant

## 2018-09-11 ENCOUNTER — Telehealth: Payer: Self-pay | Admitting: Physician Assistant

## 2018-09-11 ENCOUNTER — Encounter: Payer: Self-pay | Admitting: Physician Assistant

## 2018-09-11 ENCOUNTER — Inpatient Hospital Stay: Payer: Medicare Other

## 2018-09-11 DIAGNOSIS — C3431 Malignant neoplasm of lower lobe, right bronchus or lung: Secondary | ICD-10-CM

## 2018-09-11 DIAGNOSIS — M1990 Unspecified osteoarthritis, unspecified site: Secondary | ICD-10-CM

## 2018-09-11 DIAGNOSIS — M549 Dorsalgia, unspecified: Secondary | ICD-10-CM

## 2018-09-11 DIAGNOSIS — Z79899 Other long term (current) drug therapy: Secondary | ICD-10-CM

## 2018-09-11 DIAGNOSIS — I10 Essential (primary) hypertension: Secondary | ICD-10-CM

## 2018-09-11 DIAGNOSIS — C781 Secondary malignant neoplasm of mediastinum: Secondary | ICD-10-CM | POA: Diagnosis not present

## 2018-09-11 DIAGNOSIS — Z87891 Personal history of nicotine dependence: Secondary | ICD-10-CM

## 2018-09-11 DIAGNOSIS — E119 Type 2 diabetes mellitus without complications: Secondary | ICD-10-CM

## 2018-09-11 DIAGNOSIS — Z7982 Long term (current) use of aspirin: Secondary | ICD-10-CM

## 2018-09-11 DIAGNOSIS — E785 Hyperlipidemia, unspecified: Secondary | ICD-10-CM

## 2018-09-11 DIAGNOSIS — R110 Nausea: Secondary | ICD-10-CM

## 2018-09-11 DIAGNOSIS — G8929 Other chronic pain: Secondary | ICD-10-CM

## 2018-09-11 DIAGNOSIS — R439 Unspecified disturbances of smell and taste: Secondary | ICD-10-CM

## 2018-09-11 DIAGNOSIS — Z8673 Personal history of transient ischemic attack (TIA), and cerebral infarction without residual deficits: Secondary | ICD-10-CM

## 2018-09-11 DIAGNOSIS — R948 Abnormal results of function studies of other organs and systems: Secondary | ICD-10-CM | POA: Diagnosis not present

## 2018-09-11 LAB — CMP (CANCER CENTER ONLY)
ALT: 10 U/L (ref 0–44)
AST: 13 U/L — ABNORMAL LOW (ref 15–41)
Albumin: 3.5 g/dL (ref 3.5–5.0)
Alkaline Phosphatase: 61 U/L (ref 38–126)
Anion gap: 11 (ref 5–15)
BUN: 17 mg/dL (ref 8–23)
CO2: 26 mmol/L (ref 22–32)
Calcium: 8.7 mg/dL — ABNORMAL LOW (ref 8.9–10.3)
Chloride: 101 mmol/L (ref 98–111)
Creatinine: 1.09 mg/dL — ABNORMAL HIGH (ref 0.44–1.00)
GFR, Est AFR Am: >60 mL/min (ref >60)
GFR, Estimated: 53 mL/min — ABNORMAL LOW (ref >60)
Glucose, Bld: 96 mg/dL (ref 70–99)
Potassium: 4 mmol/L (ref 3.5–5.1)
Sodium: 138 mmol/L (ref 135–145)
Total Bilirubin: 0.5 mg/dL (ref 0.3–1.2)
Total Protein: 6.8 g/dL (ref 6.5–8.1)

## 2018-09-11 LAB — CBC WITH DIFFERENTIAL (CANCER CENTER ONLY)
Abs Immature Granulocytes: 0.05 10*3/uL (ref 0.00–0.07)
BASOS PCT: 0 %
Basophils Absolute: 0 10*3/uL (ref 0.0–0.1)
Eosinophils Absolute: 0 10*3/uL (ref 0.0–0.5)
Eosinophils Relative: 1 %
HCT: 36 % (ref 36.0–46.0)
Hemoglobin: 11.1 g/dL — ABNORMAL LOW (ref 12.0–15.0)
Immature Granulocytes: 1 %
Lymphocytes Relative: 32 %
Lymphs Abs: 1.6 10*3/uL (ref 0.7–4.0)
MCH: 27.1 pg (ref 26.0–34.0)
MCHC: 30.8 g/dL (ref 30.0–36.0)
MCV: 87.8 fL (ref 80.0–100.0)
Monocytes Absolute: 0.1 10*3/uL (ref 0.1–1.0)
Monocytes Relative: 1 %
NRBC: 0 % (ref 0.0–0.2)
Neutro Abs: 3.2 10*3/uL (ref 1.7–7.7)
Neutrophils Relative %: 65 %
PLATELETS: 114 10*3/uL — AB (ref 150–400)
RBC: 4.1 MIL/uL (ref 3.87–5.11)
RDW: 16.4 % — ABNORMAL HIGH (ref 11.5–15.5)
WBC Count: 4.9 10*3/uL (ref 4.0–10.5)

## 2018-09-11 LAB — MAGNESIUM: Magnesium: 1.2 mg/dL — CL (ref 1.7–2.4)

## 2018-09-11 MED ORDER — PROCHLORPERAZINE MALEATE 10 MG PO TABS: 10.0000 mg | ORAL_TABLET | Freq: Four times a day (QID) | ORAL | 0 refills | Status: DC | PRN

## 2018-09-11 MED ORDER — MAGNESIUM OXIDE 400 (241.3 MG) MG PO TABS: 400.0000 mg | ORAL_TABLET | Freq: Two times a day (BID) | ORAL | 0 refills | Status: DC

## 2018-09-11 NOTE — Telephone Encounter (Signed)
Called patient to follow up on what was discussed at her appointment today. Her magnesium was low at 1.2 today. I called in 400 mg of magnesium oxide to be taken BID. I additionally refilled her compazine. I informed patient that I have arranged for a referral her to preferred GI doctor for further workup regarding her recent abnormal PET scan. Discussed patient's chronic pain. Encouraged patient to follow up with her primary care doctor considering her pain is chronic in nature and not related to her malignancy. All questions were answered. The patient was given our number should she develop any questions or concerns in the interval.

## 2018-09-11 NOTE — Progress Notes (Signed)
[facility] OFFICE PROGRESS NOTE  [name], MD [address] [place] [address]  DIAGNOSIS: limited stage (T2b, N2, M0) small cell lung cancer, pending further staging work-up diagnosed and January 2020. She presented with right lower lobe lung mass in addition to right hilar and mediastinal lymphadenopathy.  PRIOR THERAPY: None  CURRENT THERAPY: Systemic chemotherapy concurrent with radiation.  Chemotherapy with cisplatin 80 mg/M2 on day 1 and etoposide 100 mg/M2 on days 1, 2 and 3 every 3 weeks. First dose September 03, 2018. Status post 1 cycle.   INTERVAL HISTORY: [name] 68 y.o. female returns to the clinic today accompanied by her significant other [name]. She tolerated the first cycle of treatment well except for mild nausea and changes in taste which she manages with compazine. She had been ensuring good hydration by drinking approximately 100 ounce of water daily. She is feeling well today but continues to experience chronic back pain. This has been present for approximately 5 years. In an effort to alleviate her pain, she has tried taking tylenol without relief.  She had been prescribed 5 mg of oxycodone from a recent hospitalization and has used that to manage her pain sucessfully. She denies any fevers, chills, night sweats, or weight loss.  She denies any chest pain, shortness of breath, cough, or hemoptysis.  She denies any nausea, vomiting, diarrhea, or constipation.  She denies any headache or visual changes.  She denies any melena or hematochezia.  Recently had a brain MRI and a PET scan to complete the staging work-up.  She is here today for evaluation to discuss the scan results.  MEDICAL HISTORY: Past Medical History:  Diagnosis Date  . Arthritis   . Chronic pain   . Hyperlipidemia   . Hypertension   . Stroke ([place])    06/19/2018 minor    ALLERGIES:  has No Known Allergies.  MEDICATIONS:  Current Outpatient Medications  Medication Sig  Dispense Refill  . acetaminophen (TYLENOL) 650 MG CR tablet Take 650 mg by mouth every 8 (eight) hours as needed for pain.    Marland Kitchen aspirin EC 325 MG EC tablet Take 1 tablet (325 mg total) by mouth daily. STOP 12/15 FOR BRONCH AND BIOPSY 12/18.Marland KitchenRESUME AFTER BIOPSY 30 tablet 0  . atorvastatin (LIPITOR) 80 MG tablet Take 1 tablet (80 mg total) by mouth daily at 6 PM. 90 tablet 3  . chlorthalidone (HYGROTON) 25 MG tablet Take 1 tablet (25 mg total) by mouth daily. 90 tablet 0  . Ferrous Gluconate-C-Folic Acid (IRON-C PO) Take 65 mg by mouth every other day.    . lisinopril (PRINIVIL,ZESTRIL) 40 MG tablet Take 1 tablet (40 mg total) by mouth daily. 90 tablet 0  . magnesium oxide (MAG-OX) 400 (241.3 Mg) MG tablet Take 1 tablet (400 mg total) by mouth 2 (two) times daily. 60 tablet 0  . oxyCODONE (OXY IR/ROXICODONE) 5 MG immediate release tablet Take 1-2 tablets (5-10 mg total) by mouth every 4 (four) hours as needed for moderate pain. 30 tablet 0  . pantoprazole (PROTONIX) 40 MG tablet Take 1 tablet (40 mg total) by mouth 2 (two) times daily before a meal. 60 tablet 3  . prochlorperazine (COMPAZINE) 10 MG tablet Take 1 tablet (10 mg total) by mouth every 6 (six) hours as needed for nausea or vomiting. 30 tablet 0   No current facility-administered medications for this visit.     SURGICAL HISTORY:  Past Surgical History:  Procedure Laterality Date  . BIOPSY  06/19/2018   Procedure: BIOPSY;  Surgeon: Laurence Spates, MD;  Location: [facility];  Service: Endoscopy;;  . ENDARTERECTOMY Right 06/26/2018   Procedure: ENDARTERECTOMY CAROTID RIGHT;  Surgeon: Serafina Mitchell, MD;  Location: [facility] OR;  Service: Vascular;  Laterality: Right;  . ESOPHAGOGASTRODUODENOSCOPY (EGD) WITH PROPOFOL N/A 06/19/2018   Procedure: ESOPHAGOGASTRODUODENOSCOPY (EGD) WITH PROPOFOL;  Surgeon: Laurence Spates, MD;  Location: [facility];  Service: Endoscopy;  Laterality: N/A;  . PATCH ANGIOPLASTY Right 06/26/2018   Procedure: PATCH  ANGIOPLASTY USING A XENOSURE 1cm x 6cm BIOLOGIC PATCH;  Surgeon: Serafina Mitchell, MD;  Location: [facility];  Service: Vascular;  Laterality: Right;  Marland Kitchen VIDEO BRONCHOSCOPY WITH ENDOBRONCHIAL ULTRASOUND N/A 08/14/2018   Procedure: VIDEO BRONCHOSCOPY WITH ENDOBRONCHIAL ULTRASOUND;  Surgeon: Collene Gobble, MD;  Location: [facility] OR;  Service: Thoracic;  Laterality: N/A;    REVIEW OF SYSTEMS:   Review of Systems  Constitutional: Negative for appetite change, chills, fatigue, fever and unexpected weight change.  HENT:  Positive for changes in taste.  Negative for mouth sores, nosebleeds, sore throat and trouble swallowing.   Eyes: Negative for eye problems and icterus.  Respiratory: Negative for cough, hemoptysis, shortness of breath and wheezing.   Cardiovascular: Negative for chest pain and leg swelling.  Gastrointestinal: Negative for abdominal pain, constipation, diarrhea, melena, hematochezia, nausea and vomiting.  Genitourinary: Negative for bladder incontinence, difficulty urinating, dysuria, frequency and hematuria.   Musculoskeletal: Negative for back pain, gait problem, neck pain and neck stiffness.  Skin: Negative for itching and rash.  Neurological: Negative for dizziness, extremity weakness, gait problem, headaches, light-headedness and seizures.  Hematological: Negative for adenopathy. Does not bruise/bleed easily.  Psychiatric/Behavioral: Negative for confusion, depression and sleep disturbance. The patient is not nervous/anxious.     PHYSICAL EXAMINATION:  Blood pressure 119/79, pulse 75, temperature 99.1 F (37.3 C), temperature source Oral, resp. rate 18, height '5\' 5"'  (1.651 m), weight 203 lb 6.4 oz (92.3 kg), SpO2 96 %.  ECOG PERFORMANCE STATUS: 1 - Symptomatic but completely ambulatory  Physical Exam  Constitutional: Oriented to person, place, and time and well-developed, well-nourished, and in no distress. No distress.  HENT:  Head: Normocephalic and atraumatic.  Mouth/Throat:  Upper and lower dentures noted. Oropharynx is clear and moist. No oropharyngeal exudate.  Eyes: Conjunctivae are normal. Right eye exhibits no discharge. Left eye exhibits no discharge. No scleral icterus.  Neck: Normal range of motion. Neck supple.  Cardiovascular: Normal rate, regular rhythm, normal heart sounds and intact distal pulses.   Pulmonary/Chest: Effort normal and breath sounds normal. No respiratory distress. No wheezes. No rales.  Abdominal: Soft. Bowel sounds are normal. Exhibits no distension and no mass. There is no tenderness. No palpable masses on digital rectal exam.  Musculoskeletal: Normal range of motion. Exhibits no edema.  Lymphadenopathy:    No cervical adenopathy.  Neurological: Alert and oriented to person, place, and time. Exhibits normal muscle tone. Gait normal. Coordination normal.  Skin: Skin is warm and dry. No rash noted. Not diaphoretic. No erythema. No pallor.  Psychiatric: Mood, memory and judgment normal.  Vitals reviewed.  LABORATORY DATA: Lab Results  Component Value Date   WBC 4.9 09/11/2018   HGB 11.1 (L) 09/11/2018   HCT 36.0 09/11/2018   MCV 87.8 09/11/2018   PLT 114 (L) 09/11/2018      Chemistry      Component Value Date/Time   NA 138 09/11/2018 1348   NA 142 08/02/2018 1042   K 4.0 09/11/2018 1348   CL  101 09/11/2018 1348   CO2 26 09/11/2018 1348   BUN 17 09/11/2018 1348   BUN 9 08/02/2018 1042   CREATININE 1.09 (H) 09/11/2018 1348      Component Value Date/Time   CALCIUM 8.7 (L) 09/11/2018 1348   ALKPHOS 61 09/11/2018 1348   AST 13 (L) 09/11/2018 1348   ALT 10 09/11/2018 1348   BILITOT 0.5 09/11/2018 1348       RADIOGRAPHIC STUDIES:  Mr Jeri Cos IR Contrast  Result Date: 08/30/2018 CLINICAL DATA:  Lung cancer. EXAM: MRI HEAD WITHOUT AND WITH CONTRAST TECHNIQUE: Multiplanar, multiecho pulse sequences of the brain and surrounding structures were obtained without and with intravenous contrast. CONTRAST:  10 mL Gadavist  COMPARISON:  MRI brain 06/21/2018 FINDINGS: Brain: Areas of previous infarcts are again noted and watershed distribution over the right frontal and parietal lobe. No restricted diffusion is present to suggest progressive or acute infarcts. No acute or chronic hemorrhage is present. Moderate atrophy and white matter changes are present otherwise. Brainstem is normal. Remote lacunar infarcts are present in the medial cerebellum bilaterally. The ventricles are of proportionate to the degree of atrophy. No significant extraaxial fluid collection is present. Postcontrast images demonstrate no pathologic enhancement. Vascular: Flow is present in the major intracranial arteries. Skull and upper cervical spine: The craniocervical junction is normal. Upper cervical spine is within normal limits. Marrow signal is unremarkable. Sinuses/Orbits: The paranasal sinuses and mastoid air cells are clear. The globes and orbits are within normal limits. IMPRESSION: 1. Involving right watershed territory infarcts. 2. No acute infarct. 3. No evidence for metastatic disease to the brain. Electronically Signed   By: San Morelle M.D.   On: 08/30/2018 14:09   Nm Pet Image Initial (pi) Skull Base To Thigh  Result Date: 08/30/2018 CLINICAL DATA:  Initial treatment strategy for bronchogenic carcinoma. EXAM: NUCLEAR MEDICINE PET SKULL BASE TO THIGH TECHNIQUE: 10.2 mCi F-18 FDG was injected intravenously. Full-ring PET imaging was performed from the skull base to thigh after the radiotracer. CT data was obtained and used for attenuation correction and anatomic localization. Fasting blood glucose: 120 mg/dl COMPARISON:  Neck CT 06/22/2018 FINDINGS: Mediastinal blood pool activity: SUV max 3.1 NECK: No hypermetabolic lymph nodes in the neck. Incidental CT findings: none CHEST: Hypermetabolic RIGHT lower lobe mass measures 4.1 x 4.1 cm SUV max equal 8.7. Hypermetabolic ipsilateral hilar nodal metastasis with SUV max equal 9.6.  Hypermetabolic mediastinal nodal metastasis. Bulky RIGHT lower paratracheal lymph node measures 4.9 cm with central necrosis and intense peripheral metabolic activity with SUV max equal 17. Hypermetabolic small prevascular node anterior to the brachycephalic vein measures 13 mm. No hypermetabolic supraclavicular nodes. Hypermetabolic subcarinal node is present. No hypermetabolic contralateral hilar nodes. Incidental CT findings: none ABDOMEN/PELVIS: No abnormal hypermetabolic activity within the liver, pancreas, adrenal glands, or spleen. No hypermetabolic lymph nodes in the abdomen or pelvis. There is intense activity associated with the most distal aspect of the anal canal. Activity is intense with SUV max equal 15.3. When the window intensity is decreased, activity appears associated with the musculature of the anal canal rather than the lumen which is concerning. Mild activity associated with inguinal lymph nodes with SUV max equal 2.7 Incidental CT findings: none SKELETON: No focal hypermetabolic activity to suggest skeletal metastasis. Incidental CT findings: none IMPRESSION: 1. Intensely hypermetabolic RIGHT lower lobe mass consistent with BRONCHOGENIC CARCINOMA. Mass measures 4 cm but does have larger contiguous extension into the RIGHT hilum. 2. Hypermetabolic RIGHT hilar metastasis, bulky RIGHT paratracheal nodal  metastasis and subcarinal nodal metastasis. No supraclavicular nodal metastasis or contralateral nodal metastasis. Staging by FDG PET scan T2a N2 m0. 3. Intense hypermetabolic activity in the distal anal canal. Recommend direct physical exam/optical exam to evaluate for ANAL CARCINOMA. These results will be called to the ordering clinician or representative by the Radiologist Assistant, and communication documented in the PACS or zVision Dashboard. Electronically Signed   By: Suzy Bouchard M.D.   On: 08/30/2018 11:58     ASSESSMENT/PLAN:  This is a very pleasant 68 year old Caucasian  female recently diagnosed with limited stage (T2b, N2, M0) small cell lung cancer. Diagnosed January 2020.  She presented with right lower lobe lung mass in addition to right hilar and mediastinal lymphadenopathy.  The patient is currently undergoing treatment with cisplatin 80 mg on day 1 and etoposide 188m/m on days 1, 2, and 3 every 3 weeks. She is status post 1 cycle. First dose was given on September 03, 2018.  The patient tolerated her first treatment well without any concerning side effects except for mild nausea and changes in taste. Recommend she proceed with cycle #2 as scheduled in 2 weeks.   The patient was seen with Dr. MJulien Nordmanntoday. The patient had a brain MRI a PET scan recently to complete the staging workup. Dr. MJulien Nordmannpersonally and independently reviewed the scans and discussed the results with the patient and her significant other. The brain MRI did not show any evidence of metastasis.  Regarding the PET scan, it demonstrated hypermetabolic activity at the known 4 cm mass in the right lower lobe in addition to right hilar, paratracheal, and subcarinal nodal metastasis.  Additionally, the PET scan showed intense hypermetabolic activity in the distal anal canal. The hypermetabolic activity in the anal region is concerning for anal carcinoma. The patient has never received a colonoscopy. She has a Cologuard kit at home but had not used it yet. A digital rectal exam was performed. No masses were palpated. I discussed with the patient that this warrants further evaluation by GI. She has previous seen Dr. EOletta Lamasand wishes to see him for further evaluation and inspection. I [facility]   Her magnesium was low today at 1.2. A prescription for 400 mg BID of magnesium oxide was sent to her pharmacy.   The patient continues to have back pain. This is chronic in nature and there was no evidence of any malignancy contributing to her back pain  visualized on PET scan.  Advised the patient to follow up with her PCP regarding management of her chronic back pain.   A refill for Compazine  was sent to her pharmacy.  We will see the patient back in 2 weeks before starting cycle #2.   The patient was advised to call immediately if she has any concerning symptoms in the interval. The patient voices understanding of current disease status and treatment options and is in agreement with the current care plan. All questions were answered. The patient knows to call the clinic with any problems, questions or concerns. We can certainly see the patient much sooner if necessary  Orders Placed This Encounter  Procedures  . Ambulatory referral to Gastroenterology    Referral Priority:   Urgent    Referral Type:   Consultation    Referral Reason:   Specialty Services Required    Referred to Provider:   ELaurence Spates MD    Requested Specialty:   Gastroenterology  Number of Visits Requested:   [place], PA-C 09/11/18  ADDENDUM: Hematology/Oncology Attending: I had a face-to-face encounter with the patient.  I recommended her care plan.  This is a very pleasant 68 years old white female recently diagnosed with limited stage small cell lung cancer and currently undergoing systemic chemotherapy with cisplatin and topotecan status post 1 cycle given last week.  The patient tolerated the first week of her treatment well except for mild nausea. She had repeat PET scan and MRI of the brain performed recently.  Her MRI of the brain showed no concerning findings for metastatic disease to the brain.  The PET scan showed no evidence of metastatic disease outside the chest but there was hypermetabolic activity in the anal area.  I personally and independently reviewed the scan images and discussed the result with the patient. We performed an anal exam that showed no palpable mass.  We will refer the patient to gastroenterology for  evaluation. I recommended for the patient to continue her current treatment with cisplatin and etoposide and she will come back for cycle #2 in 2 weeks. The patient was advised to call immediately if she has any concerning symptoms in the interval.  Disclaimer: This note was dictated with voice recognition software. Similar sounding words can inadvertently be transcribed and may be missed upon review. Eilleen Kempf, MD 09/11/18

## 2018-09-12 ENCOUNTER — Encounter: Payer: Self-pay | Admitting: Internal Medicine

## 2018-09-12 ENCOUNTER — Telehealth: Payer: Self-pay | Admitting: Internal Medicine

## 2018-09-12 NOTE — Telephone Encounter (Signed)
Scheduled appt per 2/26 los - pt to get an updated schedule next visit

## 2018-09-12 NOTE — Progress Notes (Signed)
Thoracic Location of Tumor / Histology:  limited stage (T2b, N2,M0) small cell lung cancer, pending further staging work-up diagnosed and January 2020.    Patient presented with symptoms of: She presented with right lower lobe lung mass in addition to right hilar and mediastinal lymphadenopathy.   Biopsies revealed: 08/14/18: Diagnosis Lung, biopsy, Right lower lobe - SMALL CELL CARCINOMA.  Diagnosis FINE NEEDLE ASPIRATION ENDOSCOPIC (C) EBUS RIGHT LOWER LOBE BRUSHING (SPECIMEN 3 OF 3, COLLECTED 08/14/2018): MALIGNANT CELLS CONSISTENT WITH SMALL CELL UNDIFFERENTIATED CARCINOMA. Diagnosis FINE NEEDLE ASPIRATION ENDOSCOPIC (B) EBUS 11R (SPECIMEN 2 OF 3, COLLECTED 08/14/2018): MALIGNANT CELLS CONSISTENT WITH SMALL CELL UNDIFFERENTIATED CARCINOMA. Diagnosis FINE NEEDLE ASPIRATION ENDOSCOPIC (A) EBUS 4R (SPECIMEN 1 OF 3, COLLECTED 08/14/2018): MALIGNANT CELLS CONSISTENT WITH SMALL CELL UNDIFFERENTIATED CARCINOMA.  Tobacco/Marijuana/Snuff/ETOH use:  Tobacco Use  . Smoking status: Former Smoker    Packs/day: 1.00    Years: 10.00    Pack years: 10.00    Types: Cigarettes    Last attempt to quit: 06/19/2018    Years since quitting: 0.1  . Smokeless tobacco: Never Used  . Tobacco comment: Currently using Nicorette Gum  Substance Use Topics  . Alcohol use: Yes    Comment: wine 2 glasses a day   . Drug use: Never    Comment: Hemp Oil     Past/Anticipated interventions by cardiothoracic surgery, if any: 08/14/18:  Video Bronchoscopy with Endobronchial Ultrasound Procedure Note Surgeon: Baltazar Apo  Past/Anticipated interventions by medical oncology, if any: Per Dr. Julien Nordmann 09/11/18:  ASSESSMENT/PLAN:  This is a very pleasant 68 year old Caucasian female recently diagnosed with limited stage (T2b, N2,M0) small cell lung cancer. Diagnosed January 2020. She presented with right lower lobe lung mass in addition to right hilar and mediastinal lymphadenopathy.  The patient  is currently undergoing treatment with cisplatin 80 mg on day 1 and etoposide 147m/m on days 1, 2, and 3 every 3 weeks. She is status post 1 cycle. First dose was given on September 03, 2018.  The patient tolerated her first treatment well without any concerning side effects except for mild nausea and changes in taste. Recommend she proceed with cycle #2 as scheduled in 2 weeks.   The patient was seen with Dr. MJulien Nordmanntoday. The patient had a brain MRI a PET scan recently to complete the staging workup. Dr. MJulien Nordmannpersonally and independently reviewed the scans and discussed the results with the patient and her significant other. The brain MRI did not show any evidence of metastasis.  Regarding the PET scan, it demonstrated hypermetabolic activity at the known 4 cm mass in the right lower lobe in addition to right hilar, paratracheal, and subcarinal nodal metastasis.  Additionally, the PET scan showed intense hypermetabolic activity in the distal anal canal. The hypermetabolic activity in the anal region is concerning for anal carcinoma. The patient has never received a colonoscopy. She has a Cologuard kit at home but had not used it yet. A digital rectal exam was performed. No masses were palpated. I discussed with the patient that this warrants further evaluation by GI. She has previous seen Dr. EOletta Lamasand wishes to see him for further evaluation and inspection. I have arranged a [facility]   Her magnesium was low today at 1.2. A prescription for 400 mg BID of magnesium oxide was sent to her pharmacy.   The patient continues to have back pain. This is chronic in nature and there was no evidence of any malignancy contributing  to her back pain visualized on PET scan.  Advised the patient to follow up with her PCP regarding management of her chronic back pain.   A refill for Compazine  was sent to her pharmacy.  We will see the patient back in 2 weeks before  starting cycle #2.   Signs/Symptoms  Weight changes, if any: She lost around 11 pounds in the last 2 months.  Respiratory complaints, if any: She denied having any chest pain, shortness of breath but has mild cough with no hemoptysis  Hemoptysis, if any: no hemoptysis  Pain issues, if any:  she continues to have the pain on the right side of the neck as well as back.   SAFETY ISSUES:  Prior radiation? No  Pacemaker/ICD? No   Possible current pregnancy? No  Is the patient on methotrexate? No  Current Complaints / other details:   Vitals:   09/18/18 0828  BP: (!) 154/89  Pulse: 71  Resp: 20  Temp: 98.5 F (36.9 C)  TempSrc: Oral  SpO2: 99%  Weight: 206 lb 6.4 oz (93.6 kg)  Height: _0  (1.651 m)   Wt Readings from Last 3 Encounters:  09/18/18 206 lb 6.4 oz (93.6 kg)  09/11/18 203 lb 6.4 oz (92.3 kg)  09/03/18 199 lb 8 oz (90.5 kg)

## 2018-09-14 ENCOUNTER — Telehealth: Payer: Self-pay | Admitting: Internal Medicine

## 2018-09-14 NOTE — Telephone Encounter (Signed)
Per sch msg, patient wants to cancel appt. Spoke with patient, cancelled 3/10 MD appt only per patient request.

## 2018-09-17 ENCOUNTER — Telehealth: Payer: Self-pay | Admitting: Internal Medicine

## 2018-09-17 ENCOUNTER — Other Ambulatory Visit: Payer: Medicare Other

## 2018-09-17 NOTE — Telephone Encounter (Signed)
Scheduled appt per 3/2 sch message- pt aware  Added appts for flush - patient does not want to have a follow up scheduled on 3/10 , states that it does not make sense to them that they need a follow up before treatment and not after. And doesn't want to pay the money for a copay for something that is not needed.   I tried to explain that she needs to be seen but patient  is very set that it is not needed. - msg sent to MD and PA letting them know.

## 2018-09-17 NOTE — Progress Notes (Signed)
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 female who is accompanied by her boyfriend. The patient presented to the ED with acute stroke on 06/22/2019. She underwent imaging studies after being admitted to the hospital. Neck CT performed on 06/23/2019 showed an incidental 5.5 cm right paratracheal mass. She was referred to Dr. Lamonte Sakai for biopsy. Chest CT scan on 08/09/2018 showed slight increase in right lower lobe lung mass, as well as right hilar, right paratracheal, and right pre-vascular lymph nodes identified.  The patient proceeded to right bronchoscopy with biopsy on 08/14/2018 showing: small cell carcinoma of the right lower lobe. 3 endoscopic fine needle aspirations were also obtained, all with malignant cells consistent with small cell undifferentiated carcinoma.  She met with Dr. Julien Nordmann on 08/28/2018. She proceeded to staging scans on 08/30/2018. PET scan showed: 4 cm right lower lobe mass with contiguous extension into the right hilum; right hilar, bulky right paratracheal node, and subcarinal node metastases; intense hypermetabolic activity in the distal anal canal. Brain MRI showed no evidence for metastatic disease. She was started on systemic chemotherapy on 09/03/2018.   PREVIOUS RADIATION THERAPY: No  PAST MEDICAL HISTORY:  has a past medical history of Arthritis, Chronic pain, Hyperlipidemia, Hypertension, and Stroke ([place]).    PAST SURGICAL HISTORY: Past Surgical History:  Procedure Laterality Date  . BIOPSY  06/19/2018   Procedure: BIOPSY;  Surgeon: Laurence Spates, MD;  Location: [facility];  Service: Endoscopy;;  .  ENDARTERECTOMY Right 06/26/2018   Procedure: ENDARTERECTOMY CAROTID RIGHT;  Surgeon: Serafina Mitchell, MD;  [facility] OR;  Service: Vascular;  Laterality: Right;  . ESOPHAGOGASTRODUODENOSCOPY (EGD) WITH PROPOFOL N/A 06/19/2018   Procedure: ESOPHAGOGASTRODUODENOSCOPY (EGD) WITH PROPOFOL;  Surgeon: Laurence Spates, MD;  Location: [facility];  Service: Endoscopy;  Laterality: N/A;  . PATCH ANGIOPLASTY Right 06/26/2018   Procedure: PATCH ANGIOPLASTY USING A XENOSURE 1cm x 6cm BIOLOGIC PATCH;  Surgeon: Serafina Mitchell, MD;  Location: [facility];  Service: Vascular;  Laterality: Right;  Marland Kitchen VIDEO BRONCHOSCOPY WITH ENDOBRONCHIAL ULTRASOUND N/A 08/14/2018   Procedure: VIDEO BRONCHOSCOPY WITH ENDOBRONCHIAL ULTRASOUND;  Surgeon: Collene Gobble, MD;  Location: [facility] OR;  Service: Thoracic;  Laterality: N/A;    FAMILY HISTORY: family history includes Cancer in her mother and another family member; Diabetes in an other family member; Hyperlipidemia in an other family member; Hypertension in an other family member.  SOCIAL HISTORY:  reports that she quit smoking about 2 months ago. Her smoking use included cigarettes. She has a 10.00 pack-year smoking history. She has never used smokeless tobacco. She reports current alcohol use. She reports that she does not use drugs.  ALLERGIES: Patient has no known allergies.  MEDICATIONS:  Current Outpatient Medications  Medication Sig Dispense Refill  . acetaminophen (TYLENOL) 650 MG CR tablet Take 650 mg by mouth every 8 (eight) hours as needed for pain.    Marland Kitchen atorvastatin (LIPITOR) 80 MG tablet Take 1 tablet (80 mg total) by mouth daily at 6 PM. 90 tablet 3  . chlorthalidone (HYGROTON) 25 MG tablet Take 1 tablet (25 mg total) by mouth daily. 90 tablet 0  . Ferrous Gluconate-C-Folic Acid (IRON-C PO) Take  65 mg by mouth every other day.    . lisinopril (PRINIVIL,ZESTRIL) 40 MG tablet Take 1 tablet (40 mg total) by mouth daily. 90 tablet 0  . magnesium oxide (MAG-OX) 400  (241.3 Mg) MG tablet Take 1 tablet (400 mg total) by mouth 2 (two) times daily. 60 tablet 0  . oxyCODONE (OXY IR/ROXICODONE) 5 MG immediate release tablet Take 1-2 tablets (5-10 mg total) by mouth every 4 (four) hours as needed for moderate pain. 30 tablet 0  . pantoprazole (PROTONIX) 40 MG tablet Take 1 tablet (40 mg total) by mouth 2 (two) times daily before a meal. 60 tablet 3  . prochlorperazine (COMPAZINE) 10 MG tablet Take 1 tablet (10 mg total) by mouth every 6 (six) hours as needed for nausea or vomiting. 30 tablet 0  . aspirin EC 325 MG EC tablet Take 1 tablet (325 mg total) by mouth daily. STOP 12/15 FOR BRONCH AND BIOPSY 12/18.Marland KitchenRESUME AFTER BIOPSY (Patient not taking: Reported on 09/18/2018) 30 tablet 0   No current facility-administered medications for this encounter.     REVIEW OF SYSTEMS:  A 10+ POINT REVIEW OF SYSTEMS WAS OBTAINED including neurology, dermatology, psychiatry, cardiac, respiratory, lymph, extremities, GI, GU, musculoskeletal, constitutional, reproductive, HEENT. She reports a mild cough and continued pain to her right neck and back. She denies any chest pain, shortness of breath, and hemoptysis. She unintentionally lost 11 pounds from December to February, which she is starting to gain back.   PHYSICAL EXAM:  height is '5\' 5"'  (1.651 m) and weight is 206 lb 6.4 oz (93.6 kg). Her oral temperature is 98.5 F (36.9 C). Her blood pressure is 154/89 (abnormal) and her pulse is 71. Her respiration is 20 and oxygen saturation is 99%.   General: Alert and oriented, in no acute distress HEENT: Head is normocephalic. Extraocular movements are intact. Oropharynx is clear. Dentures in place. Neck: Neck is supple, no palpable cervical or supraclavicular lymphadenopathy. Right neck: patient has a long scar from her carotid endarterectomy, well healed.  Heart: Regular in rate and rhythm with no murmurs, rubs, or gallops. Chest: Clear to auscultation bilaterally, with no rhonchi, wheezes,  or rales. Abdomen: Soft, nontender, nondistended, with no rigidity or guarding. Extremities: No cyanosis or edema. Lymphatics: see Neck Exam Skin: No concerning lesions. Musculoskeletal: symmetric strength and muscle tone throughout. Neurologic: Cranial nerves II through XII are grossly intact. No obvious focalities. Speech is fluent. Coordination is intact. Psychiatric: Judgment and insight are intact. Affect is appropriate.   ECOG = 1  0 - Asymptomatic (Fully active, able to carry on all predisease activities without restriction)  1 - Symptomatic but completely ambulatory (Restricted in physically strenuous activity but ambulatory and able to carry out work of a light or sedentary nature. For example, light housework, office work)  2 - Symptomatic, <50% in bed during the day (Ambulatory and capable of all self care but unable to carry out any work activities. Up and about more than 50% of waking hours)  3 - Symptomatic, >50% in bed, but not bedbound (Capable of only limited self-care, confined to bed or chair 50% or more of waking hours)  4 - Bedbound (Completely disabled. Cannot carry on any self-care. Totally confined to bed or chair)  5 - Death   Eustace Pen MM, Creech RH, Tormey DC, et al. [phone]). "Toxicity and response criteria of the [facility]". [place] Oncol. 5 (6): 649-55  LABORATORY DATA:  Lab Results  Component Value Date  WBC 1.8 (L) 09/18/2018   HGB 11.0 (L) 09/18/2018   HCT 36.4 09/18/2018   MCV 88.6 09/18/2018   PLT 181 09/18/2018   NEUTROABS 0.3 (LL) 09/18/2018   Lab Results  Component Value Date   NA 141 09/18/2018   K 4.5 09/18/2018   CL 103 09/18/2018   CO2 29 09/18/2018   GLUCOSE 110 (H) 09/18/2018   CREATININE 0.96 09/18/2018   CALCIUM 9.1 09/18/2018      RADIOGRAPHY: Mr Jeri Cos JJ Contrast  Result Date: 08/30/2018 CLINICAL DATA:  Lung cancer. EXAM: MRI HEAD WITHOUT AND WITH CONTRAST TECHNIQUE: Multiplanar, multiecho  pulse sequences of the brain and surrounding structures were obtained without and with intravenous contrast. CONTRAST:  10 mL Gadavist COMPARISON:  MRI brain 06/21/2018 FINDINGS: Brain: Areas of previous infarcts are again noted and watershed distribution over the right frontal and parietal lobe. No restricted diffusion is present to suggest progressive or acute infarcts. No acute or chronic hemorrhage is present. Moderate atrophy and white matter changes are present otherwise. Brainstem is normal. Remote lacunar infarcts are present in the medial cerebellum bilaterally. The ventricles are of proportionate to the degree of atrophy. No significant extraaxial fluid collection is present. Postcontrast images demonstrate no pathologic enhancement. Vascular: Flow is present in the major intracranial arteries. Skull and upper cervical spine: The craniocervical junction is normal. Upper cervical spine is within normal limits. Marrow signal is unremarkable. Sinuses/Orbits: The paranasal sinuses and mastoid air cells are clear. The globes and orbits are within normal limits. IMPRESSION: 1. Involving right watershed territory infarcts. 2. No acute infarct. 3. No evidence for metastatic disease to the brain. Electronically Signed   By: San Morelle M.D.   On: 08/30/2018 14:09   Nm Pet Image Initial (pi) Skull Base To Thigh  Result Date: 08/30/2018 CLINICAL DATA:  Initial treatment strategy for bronchogenic carcinoma. EXAM: NUCLEAR MEDICINE PET SKULL BASE TO THIGH TECHNIQUE: 10.2 mCi F-18 FDG was injected intravenously. Full-ring PET imaging was performed from the skull base to thigh after the radiotracer. CT data was obtained and used for attenuation correction and anatomic localization. Fasting blood glucose: 120 mg/dl COMPARISON:  Neck CT 06/22/2018 FINDINGS: Mediastinal blood pool activity: SUV max 3.1 NECK: No hypermetabolic lymph nodes in the neck. Incidental CT findings: none CHEST: Hypermetabolic RIGHT lower  lobe mass measures 4.1 x 4.1 cm SUV max equal 8.7. Hypermetabolic ipsilateral hilar nodal metastasis with SUV max equal 9.6. Hypermetabolic mediastinal nodal metastasis. Bulky RIGHT lower paratracheal lymph node measures 4.9 cm with central necrosis and intense peripheral metabolic activity with SUV max equal 17. Hypermetabolic small prevascular node anterior to the brachycephalic vein measures 13 mm. No hypermetabolic supraclavicular nodes. Hypermetabolic subcarinal node is present. No hypermetabolic contralateral hilar nodes. Incidental CT findings: none ABDOMEN/PELVIS: No abnormal hypermetabolic activity within the liver, pancreas, adrenal glands, or spleen. No hypermetabolic lymph nodes in the abdomen or pelvis. There is intense activity associated with the most distal aspect of the anal canal. Activity is intense with SUV max equal 15.3. When the window intensity is decreased, activity appears associated with the musculature of the anal canal rather than the lumen which is concerning. Mild activity associated with inguinal lymph nodes with SUV max equal 2.7 Incidental CT findings: none SKELETON: No focal hypermetabolic activity to suggest skeletal metastasis. Incidental CT findings: none IMPRESSION: 1. Intensely hypermetabolic RIGHT lower lobe mass consistent with BRONCHOGENIC CARCINOMA. Mass measures 4 cm but does have larger contiguous extension into the RIGHT hilum. 2. Hypermetabolic RIGHT hilar metastasis,  bulky RIGHT paratracheal nodal metastasis and subcarinal nodal metastasis. No supraclavicular nodal metastasis or contralateral nodal metastasis. Staging by FDG PET scan T2a N2 m0. 3. Intense hypermetabolic activity in the distal anal canal. Recommend direct physical exam/optical exam to evaluate for ANAL CARCINOMA. These results will be called to the ordering clinician or representative by the Radiologist Assistant, and communication documented in the PACS or zVision Dashboard. Electronically Signed    By: Suzy Bouchard M.D.   On: 08/30/2018 11:58      IMPRESSION: Stage T2a N2 M0 Small Cell Carcinoma of the RLL (limited stage)  Today, I talked to the patient and her boyfriend about the findings and work-up thus far.  We discussed the natural history of small cell lung cancer and general treatment, highlighting the role of radiotherapy in the management.  We discussed the available radiation techniques, and focused on the details of logistics and delivery.  We reviewed the anticipated acute and late sequelae associated with radiation in this setting.  The patient was encouraged to ask questions that I answered to the best of my ability. A patient consent form was discussed and signed.  We retained a copy for our records.  The patient would like to proceed with radiation and will be scheduled for CT simulation.  PLAN: She will proceed with CT simulation later today. The patient will start treatment next week; anticipate 6 weeks of radiation therapy. She will also receive her second cycle of chemotherapy. If she  has good response to her chemotherapy and radiation treatments we will also need to consider prophylactic cranial irradiation.    ------------------------------------------------  Blair Promise, PhD, MD  This document serves as a record of services personally performed by Gery Pray, MD. It was created on his behalf by Wilburn Mylar, a trained medical scribe. The creation of this record is based on the scribe's personal observations and the provider's statements to them. This document has been checked and approved by the attending provider.

## 2018-09-18 ENCOUNTER — Telehealth: Payer: Self-pay | Admitting: *Deleted

## 2018-09-18 ENCOUNTER — Other Ambulatory Visit: Payer: Self-pay | Admitting: Radiology

## 2018-09-18 ENCOUNTER — Ambulatory Visit
Admission: RE | Admit: 2018-09-18 | Discharge: 2018-09-18 | Disposition: A | Discharge status: Home / Self Care | Payer: Medicare Other | Source: Ambulatory Visit | Attending: Radiation Oncology | Admitting: Radiation Oncology

## 2018-09-18 ENCOUNTER — Encounter: Payer: Self-pay | Admitting: Radiation Oncology

## 2018-09-18 ENCOUNTER — Other Ambulatory Visit: Payer: Self-pay | Admitting: *Deleted

## 2018-09-18 ENCOUNTER — Inpatient Hospital Stay: Payer: Medicare Other

## 2018-09-18 ENCOUNTER — Inpatient Hospital Stay: Payer: Medicare Other | Attending: Internal Medicine

## 2018-09-18 ENCOUNTER — Other Ambulatory Visit: Payer: Self-pay

## 2018-09-18 VITALS — BP 154/89 | HR 71 | Temp 98.5°F | Resp 20 | Ht 65.0 in | Wt 206.4 lb

## 2018-09-18 DIAGNOSIS — Z9221 Personal history of antineoplastic chemotherapy: Secondary | ICD-10-CM | POA: Insufficient documentation

## 2018-09-18 DIAGNOSIS — R439 Unspecified disturbances of smell and taste: Secondary | ICD-10-CM | POA: Insufficient documentation

## 2018-09-18 DIAGNOSIS — Z87891 Personal history of nicotine dependence: Secondary | ICD-10-CM | POA: Insufficient documentation

## 2018-09-18 DIAGNOSIS — Z8673 Personal history of transient ischemic attack (TIA), and cerebral infarction without residual deficits: Secondary | ICD-10-CM | POA: Insufficient documentation

## 2018-09-18 DIAGNOSIS — R197 Diarrhea, unspecified: Secondary | ICD-10-CM | POA: Insufficient documentation

## 2018-09-18 DIAGNOSIS — R5383 Other fatigue: Secondary | ICD-10-CM | POA: Diagnosis not present

## 2018-09-18 DIAGNOSIS — Z79899 Other long term (current) drug therapy: Secondary | ICD-10-CM | POA: Diagnosis not present

## 2018-09-18 DIAGNOSIS — Z7982 Long term (current) use of aspirin: Secondary | ICD-10-CM | POA: Diagnosis not present

## 2018-09-18 DIAGNOSIS — T451X5S Adverse effect of antineoplastic and immunosuppressive drugs, sequela: Secondary | ICD-10-CM | POA: Insufficient documentation

## 2018-09-18 DIAGNOSIS — Z792 Long term (current) use of antibiotics: Secondary | ICD-10-CM | POA: Diagnosis not present

## 2018-09-18 DIAGNOSIS — Z510 Encounter for antineoplastic radiation therapy: Secondary | ICD-10-CM | POA: Insufficient documentation

## 2018-09-18 DIAGNOSIS — C771 Secondary and unspecified malignant neoplasm of intrathoracic lymph nodes: Secondary | ICD-10-CM | POA: Diagnosis not present

## 2018-09-18 DIAGNOSIS — C3431 Malignant neoplasm of lower lobe, right bronchus or lung: Secondary | ICD-10-CM | POA: Insufficient documentation

## 2018-09-18 DIAGNOSIS — I10 Essential (primary) hypertension: Secondary | ICD-10-CM | POA: Insufficient documentation

## 2018-09-18 DIAGNOSIS — M1990 Unspecified osteoarthritis, unspecified site: Secondary | ICD-10-CM | POA: Diagnosis not present

## 2018-09-18 DIAGNOSIS — D6481 Anemia due to antineoplastic chemotherapy: Secondary | ICD-10-CM | POA: Insufficient documentation

## 2018-09-18 DIAGNOSIS — I700 Atherosclerosis of aorta: Secondary | ICD-10-CM | POA: Insufficient documentation

## 2018-09-18 DIAGNOSIS — I878 Other specified disorders of veins: Secondary | ICD-10-CM

## 2018-09-18 DIAGNOSIS — D701 Agranulocytosis secondary to cancer chemotherapy: Secondary | ICD-10-CM | POA: Diagnosis not present

## 2018-09-18 DIAGNOSIS — M129 Arthropathy, unspecified: Secondary | ICD-10-CM | POA: Diagnosis not present

## 2018-09-18 DIAGNOSIS — R509 Fever, unspecified: Secondary | ICD-10-CM | POA: Diagnosis not present

## 2018-09-18 DIAGNOSIS — I808 Phlebitis and thrombophlebitis of other sites: Secondary | ICD-10-CM | POA: Diagnosis not present

## 2018-09-18 DIAGNOSIS — E785 Hyperlipidemia, unspecified: Secondary | ICD-10-CM | POA: Insufficient documentation

## 2018-09-18 DIAGNOSIS — Z5111 Encounter for antineoplastic chemotherapy: Secondary | ICD-10-CM | POA: Diagnosis not present

## 2018-09-18 DIAGNOSIS — R110 Nausea: Secondary | ICD-10-CM | POA: Diagnosis not present

## 2018-09-18 LAB — CMP (CANCER CENTER ONLY)
ALT: 10 U/L (ref 0–44)
AST: 12 U/L — ABNORMAL LOW (ref 15–41)
Albumin: 3.4 g/dL — ABNORMAL LOW (ref 3.5–5.0)
Alkaline Phosphatase: 74 U/L (ref 38–126)
Anion gap: 9 (ref 5–15)
BUN: 12 mg/dL (ref 8–23)
CO2: 29 mmol/L (ref 22–32)
Calcium: 9.1 mg/dL (ref 8.9–10.3)
Chloride: 103 mmol/L (ref 98–111)
Creatinine: 0.96 mg/dL (ref 0.44–1.00)
GFR, Est AFR Am: >60 mL/min (ref >60)
Glucose, Bld: 110 mg/dL — ABNORMAL HIGH (ref 70–99)
Potassium: 4.5 mmol/L (ref 3.5–5.1)
Sodium: 141 mmol/L (ref 135–145)
Total Bilirubin: 0.2 mg/dL — ABNORMAL LOW (ref 0.3–1.2)
Total Protein: 6.4 g/dL — ABNORMAL LOW (ref 6.5–8.1)

## 2018-09-18 LAB — CBC WITH DIFFERENTIAL (CANCER CENTER ONLY)
Abs Immature Granulocytes: 0 10*3/uL (ref 0.00–0.07)
Basophils Absolute: 0 10*3/uL (ref 0.0–0.1)
Basophils Relative: 0 %
EOS PCT: 4 %
Eosinophils Absolute: 0.1 10*3/uL (ref 0.0–0.5)
HCT: 36.4 % (ref 36.0–46.0)
Hemoglobin: 11 g/dL — ABNORMAL LOW (ref 12.0–15.0)
Immature Granulocytes: 0 %
Lymphocytes Relative: 67 %
Lymphs Abs: 1.2 10*3/uL (ref 0.7–4.0)
MCH: 26.8 pg (ref 26.0–34.0)
MCHC: 30.2 g/dL (ref 30.0–36.0)
MCV: 88.6 fL (ref 80.0–100.0)
Monocytes Absolute: 0.2 10*3/uL (ref 0.1–1.0)
Monocytes Relative: 13 %
Neutro Abs: 0.3 10*3/uL — CL (ref 1.7–7.7)
Neutrophils Relative %: 16 %
Platelet Count: 181 10*3/uL (ref 150–400)
RBC: 4.11 MIL/uL (ref 3.87–5.11)
RDW: 16.3 % — ABNORMAL HIGH (ref 11.5–15.5)
WBC: 1.8 10*3/uL — AB (ref 4.0–10.5)
nRBC: 0 % (ref 0.0–0.2)

## 2018-09-18 LAB — MAGNESIUM: Magnesium: 1.4 mg/dL — CL (ref 1.7–2.4)

## 2018-09-18 MED ORDER — FILGRASTIM-SNDZ 480 MCG/0.8ML IJ SOSY
480.0000 ug | PREFILLED_SYRINGE | Freq: Once | INTRAMUSCULAR | Status: AC
  Administered 2018-09-18: 480 ug via SUBCUTANEOUS
  Filled 2018-09-18: qty 0.8

## 2018-09-18 NOTE — Telephone Encounter (Signed)
Reviewed labs with Dr. Julien Nordmann. Pt is to receive Zarxio 480 mcg today and next 2 days for ANC of 0.3. Spoke with patient after her appts in Radiation Oncology. Reviewed labs with her and education provided on Zarxio (filgrastim). She voiced understanding. Orders entered for this as well as scheduling request for injection appts.  Also spoke with patient regarding appt process with MD appts occurring the day before or the day of treatment to assure that she is able to take the scheduled treatment. Pt thought it was the other way around - that she would have MD appt after her chemo.  After explanation, she voiced understanding.  Received TCT from [facility] in [place]. Pt has cancelled her port insertion until her labs return to normal. This will need to be rescheduled.  Inbasket message sent to Abelina Bachelor, RN

## 2018-09-18 NOTE — Patient Instructions (Signed)
Filgrastim, G-CSF injection What is this medicine? FILGRASTIM, G-CSF (fil GRA stim) is a granulocyte colony-stimulating factor that stimulates the growth of neutrophils, a type of white blood cell (WBC) important in the body's fight against infection. It is used to reduce the incidence of fever and infection in patients with certain types of cancer who are receiving chemotherapy that affects the bone marrow, to stimulate blood cell production for removal of WBCs from the body prior to a bone marrow transplantation, to reduce the incidence of fever and infection in patients who have severe chronic neutropenia, and to improve survival outcomes following high-dose radiation exposure that is toxic to the bone marrow. This medicine may be used for other purposes; ask your health care provider or pharmacist if you have questions. COMMON BRAND NAME(S): Neupogen, Nivestym, Zarxio What should I tell my health care provider before I take this medicine? They need to know if you have any of these conditions: -kidney disease -latex allergy -ongoing radiation therapy -sickle cell disease -an unusual or allergic reaction to filgrastim, pegfilgrastim, other medicines, foods, dyes, or preservatives -pregnant or trying to get pregnant -breast-feeding How should I use this medicine? This medicine is for injection under the skin or infusion into a vein. As an infusion into a vein, it is usually given by a health care professional in a hospital or clinic setting. If you get this medicine at home, you will be taught how to prepare and give this medicine. Refer to the Instructions for Use that come with your medication packaging. Use exactly as directed. Take your medicine at regular intervals. Do not take your medicine more often than directed. It is important that you put your used needles and syringes in a special sharps container. Do not put them in a trash can. If you do not have a sharps container, call your  pharmacist or healthcare provider to get one. Talk to your pediatrician regarding the use of this medicine in children. While this drug may be prescribed for children as young as 7 months for selected conditions, precautions do apply. Overdosage: If you think you have taken too much of this medicine contact a poison control center or emergency room at once. NOTE: This medicine is only for you. Do not share this medicine with others. What if I miss a dose? It is important not to miss your dose. Call your doctor or health care professional if you miss a dose. What may interact with this medicine? This medicine may interact with the following medications: -medicines that may cause a release of neutrophils, such as lithium This list may not describe all possible interactions. Give your health care provider a list of all the medicines, herbs, non-prescription drugs, or dietary supplements you use. Also tell them if you smoke, drink alcohol, or use illegal drugs. Some items may interact with your medicine. What should I watch for while using this medicine? You may need blood work done while you are taking this medicine. What side effects may I notice from receiving this medicine? Side effects that you should report to your doctor or health care professional as soon as possible: -allergic reactions like skin rash, itching or hives, swelling of the face, lips, or tongue -back pain -dizziness or feeling faint -fever -pain, redness, or irritation at site where injected -pinpoint red spots on the skin -shortness of breath or breathing problems -signs and symptoms of kidney injury like trouble passing urine, change in the amount of urine, or red or dark-brown urine -stomach   or side pain, or pain at the shoulder -swelling -tiredness -unusual bleeding or bruising Side effects that usually do not require medical attention (report to your doctor or health care professional if they continue or are  bothersome): -bone pain -cough -diarrhea -hair loss -headache -muscle pain This list may not describe all possible side effects. Call your doctor for medical advice about side effects. You may report side effects to FDA at 1-800-FDA-1088. Where should I keep my medicine? Keep out of the reach of children. Store in a refrigerator between 2 and 8 degrees C (36 and 46 degrees F). Do not freeze. Keep in carton to protect from light. Throw away this medicine if vials or syringes are left out of the refrigerator for more than 24 hours. Throw away any unused medicine after the expiration date. NOTE: This sheet is a summary. It may not cover all possible information. If you have questions about this medicine, talk to your doctor, pharmacist, or health care provider.  2019 Elsevier/Gold Standard (2018-02-15 15:39:45)  

## 2018-09-18 NOTE — Telephone Encounter (Signed)
TCT patient and informed her to increase her Magnesium to 3 x day. She voiced understanding. She also wanted to make sure we knew that her port insertion was cancelled today d/t ANC 0.3. Advised pt that we would reschedule after her next labs and that we see her [place] has improved.  She voiced understanding

## 2018-09-18 NOTE — Telephone Encounter (Signed)
-----   Message from Curt Bears, MD sent at 09/18/2018  3:36 PM EST ----- Increase magnesium frequency to 3 times a day. ----- Message ----- From: Buel Ream, Lab In [place] Sent: 09/18/2018   8:06 AM EST To: Curt Bears, MD

## 2018-09-18 NOTE — Telephone Encounter (Signed)
[name]. call report.  Today's ANC = 0.3.  Message left with results.

## 2018-09-18 NOTE — Progress Notes (Signed)
  Radiation Oncology         [phone] ________________________________  Name: [name] MRN: [id number]  Date: 09/18/2018  DOB: 02/22/1951  SIMULATION AND TREATMENT PLANNING NOTE    ICD-10-CM   1. Small cell carcinoma of lower lobe of right lung (HCC) C34.31     DIAGNOSIS:  Stage T2a N2 m0 Small Cell Carcinoma of the RLL  NARRATIVE:  The patient was brought to the [facility].  Identity was confirmed.  All relevant records and images related to the planned course of therapy were reviewed.  The patient freely provided informed written consent to proceed with treatment after reviewing the details related to the planned course of therapy. The consent form was witnessed and verified by the simulation staff.  Then, the patient was set-up in a stable reproducible  supine position for radiation therapy.  CT images were obtained.  Surface markings were placed.  The CT images were loaded into the planning software.  Then the target and avoidance structures were contoured.  Treatment planning then occurred.  The radiation prescription was entered and confirmed.  Then, I designed and supervised the construction of a total of 5 medically necessary complex treatment devices.  I have requested : 3D Simulation  I have requested a DVH of the following structures: heart, lungs, spinal cord, esophagus.  I have ordered:dose calc.  PLAN:  The patient will receive 60 Gy in 30 fractions along with radiosensitizing chemotherapy.   _______________________________   Special Treatment Procedure Note: The patient will be receiving radiosensitizing chemotherapy. Given the potential of increased toxicities related to combined therapy and the necessity for close monitoring of the patient and blood work, this constitutes a special treatment procedure.  -----------------------------------   -----------------------------------  Blair Promise, PhD, MD  This document serves as a record of  services personally performed by Gery Pray, MD. It was created on his behalf by Wilburn Mylar, a trained medical scribe. The creation of this record is based on the scribe's personal observations and the provider's statements to them. This document has been checked and approved by the attending provider.

## 2018-09-19 ENCOUNTER — Other Ambulatory Visit (HOSPITAL_COMMUNITY): Payer: Medicare Other

## 2018-09-19 ENCOUNTER — Ambulatory Visit (HOSPITAL_COMMUNITY): Payer: Medicare Other

## 2018-09-19 ENCOUNTER — Inpatient Hospital Stay: Payer: Medicare Other

## 2018-09-19 DIAGNOSIS — C3431 Malignant neoplasm of lower lobe, right bronchus or lung: Secondary | ICD-10-CM

## 2018-09-19 MED ORDER — FILGRASTIM-SNDZ 480 MCG/0.8ML IJ SOSY
480.0000 ug | PREFILLED_SYRINGE | Freq: Once | INTRAMUSCULAR | Status: AC
  Administered 2018-09-19: 480 ug via SUBCUTANEOUS
  Filled 2018-09-19: qty 0.8

## 2018-09-19 NOTE — Telephone Encounter (Signed)
Message sent to r/s port for Monday here or [facility].

## 2018-09-19 NOTE — Patient Instructions (Signed)
Filgrastim, G-CSF injection What is this medicine? FILGRASTIM, G-CSF (fil GRA stim) is a granulocyte colony-stimulating factor that stimulates the growth of neutrophils, a type of white blood cell (WBC) important in the body's fight against infection. It is used to reduce the incidence of fever and infection in patients with certain types of cancer who are receiving chemotherapy that affects the bone marrow, to stimulate blood cell production for removal of WBCs from the body prior to a bone marrow transplantation, to reduce the incidence of fever and infection in patients who have severe chronic neutropenia, and to improve survival outcomes following high-dose radiation exposure that is toxic to the bone marrow. This medicine may be used for other purposes; ask your health care provider or pharmacist if you have questions. COMMON BRAND NAME(S): Neupogen, Nivestym, Zarxio What should I tell my health care provider before I take this medicine? They need to know if you have any of these conditions: -kidney disease -latex allergy -ongoing radiation therapy -sickle cell disease -an unusual or allergic reaction to filgrastim, pegfilgrastim, other medicines, foods, dyes, or preservatives -pregnant or trying to get pregnant -breast-feeding How should I use this medicine? This medicine is for injection under the skin or infusion into a vein. As an infusion into a vein, it is usually given by a health care professional in a hospital or clinic setting. If you get this medicine at home, you will be taught how to prepare and give this medicine. Refer to the Instructions for Use that come with your medication packaging. Use exactly as directed. Take your medicine at regular intervals. Do not take your medicine more often than directed. It is important that you put your used needles and syringes in a special sharps container. Do not put them in a trash can. If you do not have a sharps container, call your  pharmacist or healthcare provider to get one. Talk to your pediatrician regarding the use of this medicine in children. While this drug may be prescribed for children as young as 7 months for selected conditions, precautions do apply. Overdosage: If you think you have taken too much of this medicine contact a poison control center or emergency room at once. NOTE: This medicine is only for you. Do not share this medicine with others. What if I miss a dose? It is important not to miss your dose. Call your doctor or health care professional if you miss a dose. What may interact with this medicine? This medicine may interact with the following medications: -medicines that may cause a release of neutrophils, such as lithium This list may not describe all possible interactions. Give your health care provider a list of all the medicines, herbs, non-prescription drugs, or dietary supplements you use. Also tell them if you smoke, drink alcohol, or use illegal drugs. Some items may interact with your medicine. What should I watch for while using this medicine? You may need blood work done while you are taking this medicine. What side effects may I notice from receiving this medicine? Side effects that you should report to your doctor or health care professional as soon as possible: -allergic reactions like skin rash, itching or hives, swelling of the face, lips, or tongue -back pain -dizziness or feeling faint -fever -pain, redness, or irritation at site where injected -pinpoint red spots on the skin -shortness of breath or breathing problems -signs and symptoms of kidney injury like trouble passing urine, change in the amount of urine, or red or dark-brown urine -stomach   or side pain, or pain at the shoulder -swelling -tiredness -unusual bleeding or bruising Side effects that usually do not require medical attention (report to your doctor or health care professional if they continue or are  bothersome): -bone pain -cough -diarrhea -hair loss -headache -muscle pain This list may not describe all possible side effects. Call your doctor for medical advice about side effects. You may report side effects to FDA at 1-800-FDA-1088. Where should I keep my medicine? Keep out of the reach of children. Store in a refrigerator between 2 and 8 degrees C (36 and 46 degrees F). Do not freeze. Keep in carton to protect from light. Throw away this medicine if vials or syringes are left out of the refrigerator for more than 24 hours. Throw away any unused medicine after the expiration date. NOTE: This sheet is a summary. It may not cover all possible information. If you have questions about this medicine, talk to your doctor, pharmacist, or health care provider.  2019 Elsevier/Gold Standard (2018-02-15 15:39:45)  

## 2018-09-20 ENCOUNTER — Inpatient Hospital Stay: Payer: Medicare Other

## 2018-09-20 DIAGNOSIS — C3431 Malignant neoplasm of lower lobe, right bronchus or lung: Secondary | ICD-10-CM

## 2018-09-20 MED ORDER — FILGRASTIM-SNDZ 480 MCG/0.8ML IJ SOSY
480.0000 ug | PREFILLED_SYRINGE | Freq: Once | INTRAMUSCULAR | Status: AC
  Administered 2018-09-20: 480 ug via SUBCUTANEOUS
  Filled 2018-09-20: qty 0.8

## 2018-09-20 NOTE — Patient Instructions (Signed)
Filgrastim, G-CSF injection What is this medicine? FILGRASTIM, G-CSF (fil GRA stim) is a granulocyte colony-stimulating factor that stimulates the growth of neutrophils, a type of white blood cell (WBC) important in the body's fight against infection. It is used to reduce the incidence of fever and infection in patients with certain types of cancer who are receiving chemotherapy that affects the bone marrow, to stimulate blood cell production for removal of WBCs from the body prior to a bone marrow transplantation, to reduce the incidence of fever and infection in patients who have severe chronic neutropenia, and to improve survival outcomes following high-dose radiation exposure that is toxic to the bone marrow. This medicine may be used for other purposes; ask your health care provider or pharmacist if you have questions. COMMON BRAND NAME(S): Neupogen, Nivestym, Zarxio What should I tell my health care provider before I take this medicine? They need to know if you have any of these conditions: -kidney disease -latex allergy -ongoing radiation therapy -sickle cell disease -an unusual or allergic reaction to filgrastim, pegfilgrastim, other medicines, foods, dyes, or preservatives -pregnant or trying to get pregnant -breast-feeding How should I use this medicine? This medicine is for injection under the skin or infusion into a vein. As an infusion into a vein, it is usually given by a health care professional in a hospital or clinic setting. If you get this medicine at home, you will be taught how to prepare and give this medicine. Refer to the Instructions for Use that come with your medication packaging. Use exactly as directed. Take your medicine at regular intervals. Do not take your medicine more often than directed. It is important that you put your used needles and syringes in a special sharps container. Do not put them in a trash can. If you do not have a sharps container, call your  pharmacist or healthcare provider to get one. Talk to your pediatrician regarding the use of this medicine in children. While this drug may be prescribed for children as young as 7 months for selected conditions, precautions do apply. Overdosage: If you think you have taken too much of this medicine contact a poison control center or emergency room at once. NOTE: This medicine is only for you. Do not share this medicine with others. What if I miss a dose? It is important not to miss your dose. Call your doctor or health care professional if you miss a dose. What may interact with this medicine? This medicine may interact with the following medications: -medicines that may cause a release of neutrophils, such as lithium This list may not describe all possible interactions. Give your health care provider a list of all the medicines, herbs, non-prescription drugs, or dietary supplements you use. Also tell them if you smoke, drink alcohol, or use illegal drugs. Some items may interact with your medicine. What should I watch for while using this medicine? You may need blood work done while you are taking this medicine. What side effects may I notice from receiving this medicine? Side effects that you should report to your doctor or health care professional as soon as possible: -allergic reactions like skin rash, itching or hives, swelling of the face, lips, or tongue -back pain -dizziness or feeling faint -fever -pain, redness, or irritation at site where injected -pinpoint red spots on the skin -shortness of breath or breathing problems -signs and symptoms of kidney injury like trouble passing urine, change in the amount of urine, or red or dark-brown urine -stomach   or side pain, or pain at the shoulder -swelling -tiredness -unusual bleeding or bruising Side effects that usually do not require medical attention (report to your doctor or health care professional if they continue or are  bothersome): -bone pain -cough -diarrhea -hair loss -headache -muscle pain This list may not describe all possible side effects. Call your doctor for medical advice about side effects. You may report side effects to FDA at 1-800-FDA-1088. Where should I keep my medicine? Keep out of the reach of children. Store in a refrigerator between 2 and 8 degrees C (36 and 46 degrees F). Do not freeze. Keep in carton to protect from light. Throw away this medicine if vials or syringes are left out of the refrigerator for more than 24 hours. Throw away any unused medicine after the expiration date. NOTE: This sheet is a summary. It may not cover all possible information. If you have questions about this medicine, talk to your doctor, pharmacist, or health care provider.  2019 Elsevier/Gold Standard (2018-02-15 15:39:45)  

## 2018-09-23 ENCOUNTER — Telehealth: Payer: Self-pay | Admitting: Medical Oncology

## 2018-09-23 NOTE — Telephone Encounter (Signed)
appt scheduled with Cassie tomorrow.

## 2018-09-24 ENCOUNTER — Other Ambulatory Visit: Payer: Medicare Other

## 2018-09-24 ENCOUNTER — Telehealth: Payer: Self-pay | Admitting: Physician Assistant

## 2018-09-24 ENCOUNTER — Other Ambulatory Visit: Payer: Self-pay | Admitting: Physician Assistant

## 2018-09-24 ENCOUNTER — Telehealth: Payer: Self-pay | Admitting: *Deleted

## 2018-09-24 ENCOUNTER — Inpatient Hospital Stay: Payer: Medicare Other

## 2018-09-24 ENCOUNTER — Inpatient Hospital Stay: Payer: Medicare Other | Admitting: Physician Assistant

## 2018-09-24 ENCOUNTER — Ambulatory Visit: Payer: Medicare Other | Admitting: Internal Medicine

## 2018-09-24 ENCOUNTER — Other Ambulatory Visit: Payer: Self-pay

## 2018-09-24 ENCOUNTER — Encounter: Payer: Self-pay | Admitting: Physician Assistant

## 2018-09-24 VITALS — BP 168/118 | HR 87 | Temp 99.9°F | Resp 20 | Ht 65.0 in | Wt 206.8 lb

## 2018-09-24 DIAGNOSIS — R439 Unspecified disturbances of smell and taste: Secondary | ICD-10-CM

## 2018-09-24 DIAGNOSIS — E785 Hyperlipidemia, unspecified: Secondary | ICD-10-CM

## 2018-09-24 DIAGNOSIS — Z7982 Long term (current) use of aspirin: Secondary | ICD-10-CM

## 2018-09-24 DIAGNOSIS — C3431 Malignant neoplasm of lower lobe, right bronchus or lung: Secondary | ICD-10-CM

## 2018-09-24 DIAGNOSIS — M1990 Unspecified osteoarthritis, unspecified site: Secondary | ICD-10-CM

## 2018-09-24 DIAGNOSIS — R509 Fever, unspecified: Secondary | ICD-10-CM

## 2018-09-24 DIAGNOSIS — D6481 Anemia due to antineoplastic chemotherapy: Secondary | ICD-10-CM

## 2018-09-24 DIAGNOSIS — Z510 Encounter for antineoplastic radiation therapy: Secondary | ICD-10-CM | POA: Diagnosis not present

## 2018-09-24 DIAGNOSIS — D701 Agranulocytosis secondary to cancer chemotherapy: Secondary | ICD-10-CM

## 2018-09-24 DIAGNOSIS — I10 Essential (primary) hypertension: Secondary | ICD-10-CM

## 2018-09-24 DIAGNOSIS — R110 Nausea: Secondary | ICD-10-CM

## 2018-09-24 DIAGNOSIS — Z79899 Other long term (current) drug therapy: Secondary | ICD-10-CM

## 2018-09-24 DIAGNOSIS — R5383 Other fatigue: Secondary | ICD-10-CM

## 2018-09-24 DIAGNOSIS — Z8673 Personal history of transient ischemic attack (TIA), and cerebral infarction without residual deficits: Secondary | ICD-10-CM

## 2018-09-24 DIAGNOSIS — T451X5S Adverse effect of antineoplastic and immunosuppressive drugs, sequela: Secondary | ICD-10-CM

## 2018-09-24 DIAGNOSIS — Z5111 Encounter for antineoplastic chemotherapy: Secondary | ICD-10-CM

## 2018-09-24 LAB — CBC WITH DIFFERENTIAL (CANCER CENTER ONLY)
Abs Immature Granulocytes: 0.91 10*3/uL — ABNORMAL HIGH (ref 0.00–0.07)
Basophils Absolute: 0.1 10*3/uL (ref 0.0–0.1)
Basophils Relative: 1 %
Eosinophils Absolute: 0.1 10*3/uL (ref 0.0–0.5)
Eosinophils Relative: 1 %
HCT: 38 % (ref 36.0–46.0)
HEMOGLOBIN: 11.3 g/dL — AB (ref 12.0–15.0)
Immature Granulocytes: 11 %
Lymphocytes Relative: 25 %
Lymphs Abs: 2.1 10*3/uL (ref 0.7–4.0)
MCH: 26.2 pg (ref 26.0–34.0)
MCHC: 29.7 g/dL — AB (ref 30.0–36.0)
MCV: 88.2 fL (ref 80.0–100.0)
Monocytes Absolute: 1.5 10*3/uL — ABNORMAL HIGH (ref 0.1–1.0)
Monocytes Relative: 18 %
Neutro Abs: 3.7 10*3/uL (ref 1.7–7.7)
Neutrophils Relative %: 44 %
Platelet Count: 304 10*3/uL (ref 150–400)
RBC: 4.31 MIL/uL (ref 3.87–5.11)
RDW: 17.9 % — ABNORMAL HIGH (ref 11.5–15.5)
WBC Count: 8.2 10*3/uL (ref 4.0–10.5)
nRBC: 0.5 % — ABNORMAL HIGH (ref 0.0–0.2)

## 2018-09-24 LAB — CMP (CANCER CENTER ONLY)
ALT: 12 U/L (ref 0–44)
AST: 16 U/L (ref 15–41)
Albumin: 3.5 g/dL (ref 3.5–5.0)
Alkaline Phosphatase: 102 U/L (ref 38–126)
Anion gap: 10 (ref 5–15)
BUN: 14 mg/dL (ref 8–23)
CO2: 27 mmol/L (ref 22–32)
Calcium: 8.9 mg/dL (ref 8.9–10.3)
Chloride: 103 mmol/L (ref 98–111)
Creatinine: 1.07 mg/dL — ABNORMAL HIGH (ref 0.44–1.00)
GFR, Est AFR Am: >60 mL/min (ref >60)
GFR, Estimated: 54 mL/min — ABNORMAL LOW (ref >60)
Glucose, Bld: 109 mg/dL — ABNORMAL HIGH (ref 70–99)
Potassium: 4.3 mmol/L (ref 3.5–5.1)
Sodium: 140 mmol/L (ref 135–145)
Total Bilirubin: <0.2 mg/dL — ABNORMAL LOW (ref 0.3–1.2)
Total Protein: 6.6 g/dL (ref 6.5–8.1)

## 2018-09-24 LAB — MAGNESIUM: Magnesium: 1.6 mg/dL — ABNORMAL LOW (ref 1.7–2.4)

## 2018-09-24 NOTE — Telephone Encounter (Signed)
Gave avs and calendar ° °

## 2018-09-24 NOTE — Telephone Encounter (Signed)
Call from [facility] pt has appt with 3/19 at 2:30pm

## 2018-09-24 NOTE — Progress Notes (Signed)
[place] OFFICE PROGRESS NOTE  [name], MD [address] [name] [address]  DIAGNOSIS:  limited stage (T2b, N2,M0) small cell lung cancer, pending further staging work-up diagnosed and January 2020. She presented with right lower lobe lung mass in addition to right hilar and mediastinal lymphadenopathy.  PRIOR THERAPY: None  CURRENT THERAPY: Systemic chemotherapy concurrent with radiation.  Chemotherapy with cisplatin 80 mg/M2 on day 1 and etoposide 100 mg/M2 on days 1, 2 and 3 every 3 weeks. First dose September 03, 2018. Status post 1 cycle.   INTERVAL HISTORY: [name] 68 y.o. female returns clinic today for a follow-up visit accompanied by her significant other, Dominica Severin.  She tolerated her first cycle of treatment well except for mild nausea and changes in taste in which she manages with Compazine.  In the interval since her last treatment, the patient was found to have hypomagnesemia as well as neutropenia.  She was given filgrastin for support and takes 400 mg of magnesium TID. She feels well today and denies fever, chills, night sweats, or weight loss. She denies chest pain, shortness of breath, cough, or hemoptysis. She denies any nasal congestion, sore throat, or skin infections. She denies vomiting, diarrhea, or constipation. She denies headache or visual changes. She is here today for evaluation before starting cycle #2.    MEDICAL HISTORY: Past Medical History:  Diagnosis Date  . Arthritis   . Chronic pain   . Hyperlipidemia   . Hypertension   . Stroke ([place])    06/19/2018 minor    ALLERGIES:  has No Known Allergies.  MEDICATIONS:  Current Outpatient Medications  Medication Sig Dispense Refill  . acetaminophen (TYLENOL) 650 MG CR tablet Take 650 mg by mouth every 8 (eight) hours as needed for pain.    Marland Kitchen aspirin EC 325 MG EC tablet Take 1 tablet (325 mg total) by mouth daily. STOP 12/15 FOR BRONCH AND BIOPSY 12/18.Marland KitchenRESUME AFTER BIOPSY 30  tablet 0  . atorvastatin (LIPITOR) 80 MG tablet Take 1 tablet (80 mg total) by mouth daily at 6 PM. 90 tablet 3  . chlorthalidone (HYGROTON) 25 MG tablet Take 1 tablet (25 mg total) by mouth daily. 90 tablet 0  . Ferrous Gluconate-C-Folic Acid (IRON-C PO) Take 65 mg by mouth every other day.    . lisinopril (PRINIVIL,ZESTRIL) 40 MG tablet Take 1 tablet (40 mg total) by mouth daily. 90 tablet 0  . magnesium oxide (MAG-OX) 400 (241.3 Mg) MG tablet Take 1 tablet (400 mg total) by mouth 2 (two) times daily. (Patient taking differently: Take 400 mg by mouth 3 (three) times daily. ) 60 tablet 0  . oxyCODONE (OXY IR/ROXICODONE) 5 MG immediate release tablet Take 1-2 tablets (5-10 mg total) by mouth every 4 (four) hours as needed for moderate pain. 30 tablet 0  . pantoprazole (PROTONIX) 40 MG tablet Take 1 tablet (40 mg total) by mouth 2 (two) times daily before a meal. 60 tablet 3  . prochlorperazine (COMPAZINE) 10 MG tablet Take 1 tablet (10 mg total) by mouth every 6 (six) hours as needed for nausea or vomiting. 30 tablet 0   No current facility-administered medications for this visit.     SURGICAL HISTORY:  Past Surgical History:  Procedure Laterality Date  . BIOPSY  06/19/2018   Procedure: BIOPSY;  Surgeon: Laurence Spates, MD;  Location: [facility];  Service: Endoscopy;;  . ENDARTERECTOMY Right 06/26/2018   Procedure: ENDARTERECTOMY CAROTID RIGHT;  Surgeon: Serafina Mitchell, MD;  Location: [facility] OR;  Service: Vascular;  Laterality: Right;  . ESOPHAGOGASTRODUODENOSCOPY (EGD) WITH PROPOFOL N/A 06/19/2018   Procedure: ESOPHAGOGASTRODUODENOSCOPY (EGD) WITH PROPOFOL;  Surgeon: Laurence Spates, MD;  Location: [facility];  Service: Endoscopy;  Laterality: N/A;  . PATCH ANGIOPLASTY Right 06/26/2018   Procedure: PATCH ANGIOPLASTY USING A XENOSURE 1cm x 6cm BIOLOGIC PATCH;  Surgeon: Serafina Mitchell, MD;  Location: [facility];  Service: Vascular;  Laterality: Right;  Marland Kitchen VIDEO BRONCHOSCOPY WITH ENDOBRONCHIAL  ULTRASOUND N/A 08/14/2018   Procedure: VIDEO BRONCHOSCOPY WITH ENDOBRONCHIAL ULTRASOUND;  Surgeon: Collene Gobble, MD;  Location: [facility] OR;  Service: Thoracic;  Laterality: N/A;    REVIEW OF SYSTEMS:   Review of Systems  Constitutional: Negative for appetite change, chills, fatigue, fever and unexpected weight change.  HENT:   Negative for mouth sores, nosebleeds, sore throat and trouble swallowing.   Eyes: Negative for eye problems and icterus.  Respiratory: Negative for cough, hemoptysis, shortness of breath and wheezing.   Cardiovascular: Negative for chest pain and leg swelling.  Gastrointestinal: Negative for abdominal pain, constipation, diarrhea, nausea and vomiting.  Genitourinary: Negative for bladder incontinence, difficulty urinating, dysuria, frequency and hematuria.   Musculoskeletal: Positive for chronic back pain. Negative for gait problem, neck pain and neck stiffness.  Skin: Negative for itching and rash.  Neurological: Negative for dizziness, extremity weakness, gait problem, headaches, light-headedness and seizures.  Hematological: Negative for adenopathy. Does not bruise/bleed easily.  Psychiatric/Behavioral: Negative for confusion, depression and sleep disturbance. The patient is not nervous/anxious.     PHYSICAL EXAMINATION:  Blood pressure (!) 168/118, pulse 87, temperature 99.9 F (37.7 C), temperature source Oral, resp. rate 20, height 5\' 5"  (1.651 m), weight 206 lb 12.8 oz (93.8 kg), SpO2 98 %.  ECOG PERFORMANCE STATUS: 1 - Symptomatic but completely ambulatory  Physical Exam  Constitutional: Oriented to person, place, and time and well-developed, well-nourished, and in no distress. No distress.  HENT:  Head: Normocephalic and atraumatic.  Mouth/Throat: Oropharynx is clear and moist. No oropharyngeal exudate.  Eyes: Conjunctivae are normal. Right eye exhibits no discharge. Left eye exhibits no discharge. No scleral icterus.  Neck: Normal range of motion. Neck  supple.  Cardiovascular: Normal rate, regular rhythm, normal heart sounds and intact distal pulses.   Pulmonary/Chest: Effort normal and breath sounds normal. No respiratory distress. No wheezes. No rales.  Abdominal: Soft. Bowel sounds are normal. Exhibits no distension and no mass. There is no tenderness.  Musculoskeletal: Normal range of motion. Exhibits no edema.  Lymphadenopathy:    No cervical adenopathy.  Neurological: Alert and oriented to person, place, and time. Exhibits normal muscle tone. Gait normal. Coordination normal.  Skin: Skin is warm and dry. No rash noted. Not diaphoretic. No erythema. No pallor.  Psychiatric: Mood, memory and judgment normal.  Vitals reviewed.  LABORATORY DATA: Lab Results  Component Value Date   WBC 8.2 09/24/2018   HGB 11.3 (L) 09/24/2018   HCT 38.0 09/24/2018   MCV 88.2 09/24/2018   PLT 304 09/24/2018      Chemistry      Component Value Date/Time   NA 140 09/24/2018 1051   NA 142 08/02/2018 1042   K 4.3 09/24/2018 1051   CL 103 09/24/2018 1051   CO2 27 09/24/2018 1051   BUN 14 09/24/2018 1051   BUN 9 08/02/2018 1042   CREATININE 1.07 (H) 09/24/2018 1051      Component Value Date/Time   CALCIUM 8.9 09/24/2018 1051   ALKPHOS 102 09/24/2018 1051   AST  16 09/24/2018 1051   ALT 12 09/24/2018 1051   BILITOT <0.2 (L) 09/24/2018 1051       RADIOGRAPHIC STUDIES:  Mr Jeri Cos SA Contrast  Result Date: 08/30/2018 CLINICAL DATA:  Lung cancer. EXAM: MRI HEAD WITHOUT AND WITH CONTRAST TECHNIQUE: Multiplanar, multiecho pulse sequences of the brain and surrounding structures were obtained without and with intravenous contrast. CONTRAST:  10 mL Gadavist COMPARISON:  MRI brain 06/21/2018 FINDINGS: Brain: Areas of previous infarcts are again noted and watershed distribution over the right frontal and parietal lobe. No restricted diffusion is present to suggest progressive or acute infarcts. No acute or chronic hemorrhage is present. Moderate  atrophy and white matter changes are present otherwise. Brainstem is normal. Remote lacunar infarcts are present in the medial cerebellum bilaterally. The ventricles are of proportionate to the degree of atrophy. No significant extraaxial fluid collection is present. Postcontrast images demonstrate no pathologic enhancement. Vascular: Flow is present in the major intracranial arteries. Skull and upper cervical spine: The craniocervical junction is normal. Upper cervical spine is within normal limits. Marrow signal is unremarkable. Sinuses/Orbits: The paranasal sinuses and mastoid air cells are clear. The globes and orbits are within normal limits. IMPRESSION: 1. Involving right watershed territory infarcts. 2. No acute infarct. 3. No evidence for metastatic disease to the brain. Electronically Signed   By: San Morelle M.D.   On: 08/30/2018 14:09   Nm Pet Image Initial (pi) Skull Base To Thigh  Result Date: 08/30/2018 CLINICAL DATA:  Initial treatment strategy for bronchogenic carcinoma. EXAM: NUCLEAR MEDICINE PET SKULL BASE TO THIGH TECHNIQUE: 10.2 mCi F-18 FDG was injected intravenously. Full-ring PET imaging was performed from the skull base to thigh after the radiotracer. CT data was obtained and used for attenuation correction and anatomic localization. Fasting blood glucose: 120 mg/dl COMPARISON:  Neck CT 06/22/2018 FINDINGS: Mediastinal blood pool activity: SUV max 3.1 NECK: No hypermetabolic lymph nodes in the neck. Incidental CT findings: none CHEST: Hypermetabolic RIGHT lower lobe mass measures 4.1 x 4.1 cm SUV max equal 8.7. Hypermetabolic ipsilateral hilar nodal metastasis with SUV max equal 9.6. Hypermetabolic mediastinal nodal metastasis. Bulky RIGHT lower paratracheal lymph node measures 4.9 cm with central necrosis and intense peripheral metabolic activity with SUV max equal 17. Hypermetabolic small prevascular node anterior to the brachycephalic vein measures 13 mm. No hypermetabolic  supraclavicular nodes. Hypermetabolic subcarinal node is present. No hypermetabolic contralateral hilar nodes. Incidental CT findings: none ABDOMEN/PELVIS: No abnormal hypermetabolic activity within the liver, pancreas, adrenal glands, or spleen. No hypermetabolic lymph nodes in the abdomen or pelvis. There is intense activity associated with the most distal aspect of the anal canal. Activity is intense with SUV max equal 15.3. When the window intensity is decreased, activity appears associated with the musculature of the anal canal rather than the lumen which is concerning. Mild activity associated with inguinal lymph nodes with SUV max equal 2.7 Incidental CT findings: none SKELETON: No focal hypermetabolic activity to suggest skeletal metastasis. Incidental CT findings: none IMPRESSION: 1. Intensely hypermetabolic RIGHT lower lobe mass consistent with BRONCHOGENIC CARCINOMA. Mass measures 4 cm but does have larger contiguous extension into the RIGHT hilum. 2. Hypermetabolic RIGHT hilar metastasis, bulky RIGHT paratracheal nodal metastasis and subcarinal nodal metastasis. No supraclavicular nodal metastasis or contralateral nodal metastasis. Staging by FDG PET scan T2a N2 m0. 3. Intense hypermetabolic activity in the distal anal canal. Recommend direct physical exam/optical exam to evaluate for ANAL CARCINOMA. These results will be called to the ordering clinician or representative by  the Radiologist Assistant, and communication documented in the PACS or zVision Dashboard. Electronically Signed   By: Suzy Bouchard M.D.   On: 08/30/2018 11:58     ASSESSMENT/PLAN:  This is a very pleasant 68 year old Caucasian female recently diagnosed with limited stage (T2b, N2,M0) small cell lung cancer. She was diagnosed in January 2020. She presented with a right lower lobe lung mass in addition to right hilar and mediastinal lymphadenopathy. The patient is currently undergoing treatment with cisplatin 80 mg on day 1  and etoposide 100mg /m on days 1, 2, and 3 every 3 weeks. She is status post 1 cycle. First dose was given on September 03, 2018.  She will begin radiation treatment with Dr. Sondra Come on 09/25/2018.   The patient was seen with Dr. Julien Nordmann today. The patient tolerated her first treatment well without any concerning side effects except for mild nausea and neutropenia which was corrected with filgrastin. Labs were reviewed with the patient. WBC count is WNL today. Recommend she proceed with cycle #2 tomorrow as scheduled.  I have arranged for a restaging CT scan to be completed prior to her next office visit in 3 weeks.  I will see her back for evaluation and discuss the scan results in 3 weeks.   A recent PET scan showed intense hypermetabolic activity in the anal region. I have arranged a referral to Dr. Oletta Lamas at [facility]. She will follow up with them for further evaluation.   She continues to have hypomagnesemia. Her magnesium today is 1.6. She will continue taking 400 mg TID of magnesium oxide.   The patient had a low grade fever today at 99.28F. She is asymptomatic and is not neutropenic. She has a thermometer at home and was advised to monitor her temperature. She knows to seek medical attention should she develop any signs of infection or concerning symptoms.  The patient was hypertensive today. The patient states that she believes it to be secondary to being anxious. She states she monitors her blood pressure at home and it is never that high. She takes lisinopril and chorthalidone for hypertension.  She was instructed to keep a log of her blood pressures at home and monitor her blood pressure daily. She has an upcoming appointment with her PCP and was advised to follow-up with her regarding optimal hypertension management.   The patient cancelled her port placement secondary to her neutropenia. She will reschedule her procedure at a later date. She will hold her aspirin approximately a week  before her procedure.   The patient was advised to call immediately if she has any concerning symptoms in the interval. The patient voices understanding of current disease status and treatment options and is in agreement with the current care plan. All questions were answered. The patient knows to call the clinic with any problems, questions or concerns. We can certainly see the patient much sooner if necessary   Orders Placed This Encounter  Procedures  . CT Chest W Contrast    Standing Status:   Future    Standing Expiration Date:   09/24/2019    Order Specific Question:   ** REASON FOR EXAM (FREE TEXT)    Answer:   Restaging Lung Cancer    Order Specific Question:   If indicated for the ordered procedure, I authorize the administration of contrast media per Radiology protocol    Answer:   Yes    Order Specific Question:   Preferred imaging location?    Answer:  [facility]    Order Specific Question:   Radiology Contrast Protocol - do NOT remove file path    Answer:   [email or web address]     Cassandra L Heilingoetter, PA-C 09/24/18  ADDENDUM: Hematology/Oncology Attending: I had a face-to-face encounter with the patient today.  I recommended her care plan.  This is a very pleasant 68 years old white female recently diagnosed with limited stage small cell lung cancer and she is currently undergoing systemic chemotherapy with cisplatin and etoposide status post 1 cycle.  The patient tolerated the first cycle fairly well except for fatigue and nausea several days after her treatment.  She also has chemotherapy-induced anemia and she was treated with Granix. I recommended for the patient to proceed with cycle #2 today. I recommended for her to come back for follow-up visit in 3 weeks with repeat CT scan of the chest for restaging of her disease. For the anal mass seen on previous PET scan, the patient was referred to her gastroenterologist. She was  advised to call immediately if she has any concerning symptoms in the interval.  Disclaimer: This note was dictated with voice recognition software. Similar sounding words can inadvertently be transcribed and may be missed upon review. Eilleen Kempf, MD 09/24/18

## 2018-09-24 NOTE — Telephone Encounter (Signed)
Call to [facility], referral on 2/26 was not received. Faxed over referral, demographics last progress note.

## 2018-09-25 ENCOUNTER — Other Ambulatory Visit: Payer: Self-pay

## 2018-09-25 ENCOUNTER — Ambulatory Visit
Admission: RE | Admit: 2018-09-25 | Discharge: 2018-09-25 | Disposition: A | Discharge status: Home / Self Care | Payer: Medicare Other | Source: Ambulatory Visit | Attending: Radiation Oncology | Admitting: Radiation Oncology

## 2018-09-25 ENCOUNTER — Inpatient Hospital Stay: Payer: Medicare Other

## 2018-09-25 VITALS — BP 154/99 | HR 85 | Temp 98.4°F | Resp 20

## 2018-09-25 DIAGNOSIS — C3431 Malignant neoplasm of lower lobe, right bronchus or lung: Secondary | ICD-10-CM

## 2018-09-25 DIAGNOSIS — Z510 Encounter for antineoplastic radiation therapy: Secondary | ICD-10-CM | POA: Diagnosis not present

## 2018-09-25 MED ORDER — POTASSIUM CHLORIDE 2 MEQ/ML IV SOLN
Freq: Once | INTRAVENOUS | Status: AC
  Administered 2018-09-25: 09:00:00 via INTRAVENOUS
  Filled 2018-09-25: qty 10

## 2018-09-25 MED ORDER — PALONOSETRON HCL INJECTION 0.25 MG/5ML
INTRAVENOUS | Status: AC
  Filled 2018-09-25: qty 5

## 2018-09-25 MED ORDER — PALONOSETRON HCL INJECTION 0.25 MG/5ML
0.2500 mg | Freq: Once | INTRAVENOUS | Status: AC
  Administered 2018-09-25: 0.25 mg via INTRAVENOUS

## 2018-09-25 MED ORDER — SODIUM CHLORIDE 0.9 % IV SOLN
97.0000 mg/m2 | Freq: Once | INTRAVENOUS | Status: AC
  Administered 2018-09-25: 200 mg via INTRAVENOUS
  Filled 2018-09-25: qty 10

## 2018-09-25 MED ORDER — SODIUM CHLORIDE 0.9 % IV SOLN
Freq: Once | INTRAVENOUS | Status: AC
  Administered 2018-09-25: 08:00:00 via INTRAVENOUS
  Filled 2018-09-25: qty 250

## 2018-09-25 MED ORDER — SODIUM CHLORIDE 0.9 % IV SOLN
Freq: Once | INTRAVENOUS | Status: AC
  Administered 2018-09-25: 11:00:00 via INTRAVENOUS
  Filled 2018-09-25: qty 5

## 2018-09-25 MED ORDER — SODIUM CHLORIDE 0.9 % IV SOLN
80.0000 mg/m2 | Freq: Once | INTRAVENOUS | Status: AC
  Administered 2018-09-25: 165 mg via INTRAVENOUS
  Filled 2018-09-25: qty 165

## 2018-09-25 NOTE — Patient Instructions (Signed)
[facility] Discharge Instructions for Patients Receiving Chemotherapy  Today you received the following chemotherapy agents Cisplatin (PLATINOL) & Etoposide (VEPESID).  To help prevent nausea and vomiting after your treatment, we encourage you to take your nausea medication as prescribed. DO NOT TAKE ZOFRAN FOR THE NEXT 3 DAYS, TAKE COMPAZINE.   If you develop nausea and vomiting that is not controlled by your nausea medication, call the clinic.   BELOW ARE SYMPTOMS THAT SHOULD BE REPORTED IMMEDIATELY:  *FEVER GREATER THAN 100.5 F  *CHILLS WITH OR WITHOUT FEVER  NAUSEA AND VOMITING THAT IS NOT CONTROLLED WITH YOUR NAUSEA MEDICATION  *UNUSUAL SHORTNESS OF BREATH  *UNUSUAL BRUISING OR BLEEDING  TENDERNESS IN MOUTH AND THROAT WITH OR WITHOUT PRESENCE OF ULCERS  *URINARY PROBLEMS  *BOWEL PROBLEMS  UNUSUAL RASH Items with * indicate a potential emergency and should be followed up as soon as possible.  Feel free to call the clinic should you have any questions or concerns. The clinic phone number is [phone].  Please show the [facility] at check-in to the Emergency Department and triage nurse.  Cisplatin injection What is this medicine? CISPLATIN (SIS pla tin) is a chemotherapy drug. It targets fast dividing cells, like cancer cells, and causes these cells to die. This medicine is used to treat many types of cancer like bladder, ovarian, and testicular cancers. This medicine may be used for other purposes; ask your health care provider or pharmacist if you have questions. COMMON BRAND NAME(S): Platinol, Platinol -AQ What should I tell my health care provider before I take this medicine? They need to know if you have any of these conditions: -blood disorders -hearing problems -kidney disease -recent or ongoing radiation therapy -an unusual or allergic reaction to cisplatin, carboplatin, other chemotherapy, other medicines, foods, dyes, or  preservatives -pregnant or trying to get pregnant -breast-feeding How should I use this medicine? This drug is given as an infusion into a vein. It is administered in a hospital or clinic by a specially trained health care professional. Talk to your pediatrician regarding the use of this medicine in children. Special care may be needed. Overdosage: If you think you have taken too much of this medicine contact a poison control center or emergency room at once. NOTE: This medicine is only for you. Do not share this medicine with others. What if I miss a dose? It is important not to miss a dose. Call your doctor or health care professional if you are unable to keep an appointment. What may interact with this medicine? -dofetilide -foscarnet -medicines for seizures -medicines to increase blood counts like filgrastim, pegfilgrastim, sargramostim -probenecid -pyridoxine used with altretamine -rituximab -some antibiotics like amikacin, gentamicin, neomycin, polymyxin B, streptomycin, tobramycin -sulfinpyrazone -vaccines -zalcitabine Talk to your doctor or health care professional before taking any of these medicines: -acetaminophen -aspirin -ibuprofen -ketoprofen -naproxen This list may not describe all possible interactions. Give your health care provider a list of all the medicines, herbs, non-prescription drugs, or dietary supplements you use. Also tell them if you smoke, drink alcohol, or use illegal drugs. Some items may interact with your medicine. What should I watch for while using this medicine? Your condition will be monitored carefully while you are receiving this medicine. You will need important blood work done while you are taking this medicine. This drug may make you feel generally unwell. This is not uncommon, as chemotherapy can affect healthy cells as well as cancer cells. Report any side effects. Continue your  course of treatment even though you feel ill unless your doctor  tells you to stop. In some cases, you may be given additional medicines to help with side effects. Follow all directions for their use. Call your doctor or health care professional for advice if you get a fever, chills or sore throat, or other symptoms of a cold or flu. Do not treat yourself. This drug decreases your body's ability to fight infections. Try to avoid being around people who are sick. This medicine may increase your risk to bruise or bleed. Call your doctor or health care professional if you notice any unusual bleeding. Be careful brushing and flossing your teeth or using a toothpick because you may get an infection or bleed more easily. If you have any dental work done, tell your dentist you are receiving this medicine. Avoid taking products that contain aspirin, acetaminophen, ibuprofen, naproxen, or ketoprofen unless instructed by your doctor. These medicines may hide a fever. Do not become pregnant while taking this medicine. Women should inform their doctor if they wish to become pregnant or think they might be pregnant. There is a potential for serious side effects to an unborn child. Talk to your health care professional or pharmacist for more information. Do not breast-feed an infant while taking this medicine. Drink fluids as directed while you are taking this medicine. This will help protect your kidneys. Call your doctor or health care professional if you get diarrhea. Do not treat yourself. What side effects may I notice from receiving this medicine? Side effects that you should report to your doctor or health care professional as soon as possible: -allergic reactions like skin rash, itching or hives, swelling of the face, lips, or tongue -signs of infection - fever or chills, cough, sore throat, pain or difficulty passing urine -signs of decreased platelets or bleeding - bruising, pinpoint red spots on the skin, black, tarry stools, nosebleeds -signs of decreased red blood  cells - unusually weak or tired, fainting spells, lightheadedness -breathing problems -changes in hearing -gout pain -low blood counts - This drug may decrease the number of white blood cells, red blood cells and platelets. You may be at increased risk for infections and bleeding. -nausea and vomiting -pain, swelling, redness or irritation at the injection site -pain, tingling, numbness in the hands or feet -problems with balance, movement -trouble passing urine or change in the amount of urine Side effects that usually do not require medical attention (report to your doctor or health care professional if they continue or are bothersome): -changes in vision -loss of appetite -metallic taste in the mouth or changes in taste This list may not describe all possible side effects. Call your doctor for medical advice about side effects. You may report side effects to FDA at 1-800-FDA-1088. Where should I keep my medicine? This drug is given in a hospital or clinic and will not be stored at home. NOTE: This sheet is a summary. It may not cover all possible information. If you have questions about this medicine, talk to your doctor, pharmacist, or health care provider.  2019 Elsevier/Gold Standard (2007-10-08 14:40:54)  Etoposide, VP-16 capsules What is this medicine? ETOPOSIDE, VP-16 (e toe POE side) is a chemotherapy drug. It is used to treat small cell lung cancer and other cancers. This medicine may be used for other purposes; ask your health care provider or pharmacist if you have questions. COMMON BRAND NAME(S): VePesid What should I tell my health care provider before I take this  medicine? They need to know if you have any of these conditions: -infection -kidney disease -liver disease -low blood counts, like low white cell, platelet, or red cell counts -an unusual or allergic reaction to etoposide, other medicines, foods, dyes, or preservatives -pregnant or trying to get  pregnant -breast-feeding How should I use this medicine? Take this medicine by mouth with a glass of water. Follow the directions on the prescription label. Do not open, crush, or chew the capsules. It is advisable to wear gloves when handling this medicine. Take your medicine at regular intervals. Do not take it more often than directed. Do not stop taking except on your doctor's advice. Talk to your pediatrician regarding the use of this medicine in children. Special care may be needed. Overdosage: If you think you have taken too much of this medicine contact a poison control center or emergency room at once. NOTE: This medicine is only for you. Do not share this medicine with others. What if I miss a dose? If you miss a dose, take it as soon as you can. If it is almost time for your next dose, take only that dose. Do not take double or extra doses. What may interact with this medicine? -aspirin -certain medications for seizures like carbamazepine, phenobarbital, phenytoin, valproic acid -cyclosporine -levamisole -valproic acid -warfarin This list may not describe all possible interactions. Give your health care provider a list of all the medicines, herbs, non-prescription drugs, or dietary supplements you use. Also tell them if you smoke, drink alcohol, or use illegal drugs. Some items may interact with your medicine. What should I watch for while using this medicine? Visit your doctor for checks on your progress. This drug may make you feel generally unwell. This is not uncommon, as chemotherapy can affect healthy cells as well as cancer cells. Report any side effects. Continue your course of treatment even though you feel ill unless your doctor tells you to stop. In some cases, you may be given additional medicines to help with side effects. Follow all directions for their use. Call your doctor or health care professional for advice if you get a fever, chills or sore throat, or other  symptoms of a cold or flu. Do not treat yourself. This drug decreases your body's ability to fight infections. Try to avoid being around people who are sick. This medicine may increase your risk to bruise or bleed. Call your doctor or health care professional if you notice any unusual bleeding. Talk to your doctor about your risk of cancer. You may be more at risk for certain types of cancers if you take this medicine. Do not become pregnant while taking this medicine or for at least 6 months after stopping it. Women should inform their doctor if they wish to become pregnant or think they might be pregnant. Women of child-bearing potential will need to have a negative pregnancy test before starting this medicine. There is a potential for serious side effects to an unborn child. Talk to your health care professional or pharmacist for more information. Do not breast-feed an infant while taking this medicine. Men must use a latex condom during sexual contact with a woman while taking this medicine and for at least 4 months after stopping it. A latex condom is needed even if you have had a vasectomy. Contact your doctor right away if your partner becomes pregnant. Do not donate sperm while taking this medicine and for 4 months after you stop taking this medicine. Men should  inform their doctors if they wish to father a child. This medicine may lower sperm counts. What side effects may I notice from receiving this medicine? Side effects that you should report to your doctor or health care professional as soon as possible: -allergic reactions like skin rash, itching or hives, swelling of the face, lips, or tongue -low blood counts - this medicine may decrease the number of white blood cells, red blood cells and platelets. You may be at increased risk for infections and bleeding. -signs of infection - fever or chills, cough, sore throat, pain or difficulty passing urine -signs of decreased platelets or bleeding -  bruising, pinpoint red spots on the skin, black, tarry stools, blood in the urine -signs of decreased red blood cells - unusually weak or tired, fainting spells, lightheadedness -breathing problems -changes in vision -mouth or throat sores or ulcers -pain, tingling, numbness in the hands or feet -redness, blistering, peeling or loosening of the skin, including inside the mouth -seizures -vomiting Side effects that usually do not require medical attention (report to your doctor or health care professional if they continue or are bothersome): -change in taste -diarrhea -hair loss -nausea -stomach pain This list may not describe all possible side effects. Call your doctor for medical advice about side effects. You may report side effects to FDA at 1-800-FDA-1088. Where should I keep my medicine? Keep out of the reach of children. Store in a refrigerator between 2 and 8 degrees C (36 and 46 degrees F). Do not freeze. Throw away any unused medicine after the expiration date. NOTE: This sheet is a summary. It may not cover all possible information. If you have questions about this medicine, talk to your doctor, pharmacist, or health care provider.  2019 Elsevier/Gold Standard (2015-06-25 11:49:52)

## 2018-09-26 ENCOUNTER — Inpatient Hospital Stay: Payer: Medicare Other

## 2018-09-26 ENCOUNTER — Ambulatory Visit
Admission: RE | Admit: 2018-09-26 | Discharge: 2018-09-26 | Disposition: A | Discharge status: Home / Self Care | Payer: Medicare Other | Source: Ambulatory Visit | Attending: Radiation Oncology | Admitting: Radiation Oncology

## 2018-09-26 VITALS — BP 140/92 | HR 68 | Temp 98.5°F | Resp 18

## 2018-09-26 DIAGNOSIS — C3431 Malignant neoplasm of lower lobe, right bronchus or lung: Secondary | ICD-10-CM | POA: Diagnosis not present

## 2018-09-26 DIAGNOSIS — Z510 Encounter for antineoplastic radiation therapy: Secondary | ICD-10-CM | POA: Diagnosis not present

## 2018-09-26 MED ORDER — DEXAMETHASONE SODIUM PHOSPHATE 10 MG/ML IJ SOLN
10.0000 mg | Freq: Once | INTRAMUSCULAR | Status: AC
  Administered 2018-09-26: 10 mg via INTRAVENOUS

## 2018-09-26 MED ORDER — SODIUM CHLORIDE 0.9 % IV SOLN
Freq: Once | INTRAVENOUS | Status: AC
  Administered 2018-09-26: 09:00:00 via INTRAVENOUS
  Filled 2018-09-26: qty 250

## 2018-09-26 MED ORDER — CLONIDINE HCL 0.1 MG PO TABS
ORAL_TABLET | ORAL | Status: AC
  Filled 2018-09-26: qty 2

## 2018-09-26 MED ORDER — CLONIDINE HCL 0.1 MG PO TABS
0.2000 mg | ORAL_TABLET | Freq: Once | ORAL | Status: AC
  Administered 2018-09-26: 0.2 mg via ORAL

## 2018-09-26 MED ORDER — SODIUM CHLORIDE 0.9 % IV SOLN
98.0000 mg/m2 | Freq: Once | INTRAVENOUS | Status: AC
  Administered 2018-09-26: 200 mg via INTRAVENOUS
  Filled 2018-09-26: qty 10

## 2018-09-26 MED ORDER — DEXAMETHASONE SODIUM PHOSPHATE 10 MG/ML IJ SOLN
INTRAMUSCULAR | Status: AC
  Filled 2018-09-26: qty 1

## 2018-09-26 NOTE — Patient Instructions (Signed)
[facility] Discharge Instructions for Patients Receiving Chemotherapy  Today you received the following chemotherapy agents: Etoposide.  To help prevent nausea and vomiting after your treatment, we encourage you to take your nausea medication as directed.   If you develop nausea and vomiting that is not controlled by your nausea medication, call the clinic.   BELOW ARE SYMPTOMS THAT SHOULD BE REPORTED IMMEDIATELY:  *FEVER GREATER THAN 100.5 F  *CHILLS WITH OR WITHOUT FEVER  NAUSEA AND VOMITING THAT IS NOT CONTROLLED WITH YOUR NAUSEA MEDICATION  *UNUSUAL SHORTNESS OF BREATH  *UNUSUAL BRUISING OR BLEEDING  TENDERNESS IN MOUTH AND THROAT WITH OR WITHOUT PRESENCE OF ULCERS  *URINARY PROBLEMS  *BOWEL PROBLEMS  UNUSUAL RASH Items with * indicate a potential emergency and should be followed up as soon as possible.  Feel free to call the clinic should you have any questions or concerns. The clinic phone number is [phone].  Please show the [facility] at check-in to the Emergency Department and triage nurse.

## 2018-09-26 NOTE — Progress Notes (Signed)
Dr. Julien Nordmann notified of Elevated BP readings. Catapres  .2 mg po. BP now WNL.OK to treat today per Dr. Julien Nordmann.

## 2018-09-27 ENCOUNTER — Ambulatory Visit
Admission: RE | Admit: 2018-09-27 | Discharge: 2018-09-27 | Disposition: A | Discharge status: Home / Self Care | Payer: Medicare Other | Source: Ambulatory Visit | Attending: Radiation Oncology | Admitting: Radiation Oncology

## 2018-09-27 ENCOUNTER — Other Ambulatory Visit: Payer: Self-pay

## 2018-09-27 ENCOUNTER — Inpatient Hospital Stay: Payer: Medicare Other

## 2018-09-27 ENCOUNTER — Telehealth (HOSPITAL_COMMUNITY): Payer: Self-pay

## 2018-09-27 VITALS — BP 164/80 | HR 65 | Temp 98.6°F | Resp 18

## 2018-09-27 DIAGNOSIS — C3431 Malignant neoplasm of lower lobe, right bronchus or lung: Secondary | ICD-10-CM | POA: Diagnosis not present

## 2018-09-27 DIAGNOSIS — Z510 Encounter for antineoplastic radiation therapy: Secondary | ICD-10-CM | POA: Diagnosis not present

## 2018-09-27 MED ORDER — PEGFILGRASTIM 6 MG/0.6ML ~~LOC~~ PSKT
6.0000 mg | PREFILLED_SYRINGE | Freq: Once | SUBCUTANEOUS | Status: AC
  Administered 2018-09-27: 6 mg via SUBCUTANEOUS

## 2018-09-27 MED ORDER — SODIUM CHLORIDE 0.9 % IV SOLN
95.0000 mg/m2 | Freq: Once | INTRAVENOUS | Status: AC
  Administered 2018-09-27: 200 mg via INTRAVENOUS
  Filled 2018-09-27: qty 10

## 2018-09-27 MED ORDER — SODIUM CHLORIDE 0.9 % IV SOLN
Freq: Once | INTRAVENOUS | Status: AC
  Administered 2018-09-27: 09:00:00 via INTRAVENOUS
  Filled 2018-09-27: qty 250

## 2018-09-27 MED ORDER — DEXAMETHASONE SODIUM PHOSPHATE 10 MG/ML IJ SOLN
INTRAMUSCULAR | Status: AC
  Filled 2018-09-27: qty 1

## 2018-09-27 MED ORDER — PEGFILGRASTIM 6 MG/0.6ML ~~LOC~~ PSKT
PREFILLED_SYRINGE | SUBCUTANEOUS | Status: AC
  Filled 2018-09-27: qty 0.6

## 2018-09-27 MED ORDER — DEXAMETHASONE SODIUM PHOSPHATE 10 MG/ML IJ SOLN
10.0000 mg | Freq: Once | INTRAMUSCULAR | Status: AC
  Administered 2018-09-27: 10 mg via INTRAVENOUS

## 2018-09-27 NOTE — Patient Instructions (Signed)
[facility] Discharge Instructions for Patients Receiving Chemotherapy  Today you received the following chemotherapy agents: Etoposide.  To help prevent nausea and vomiting after your treatment, we encourage you to take your nausea medication as directed.   If you develop nausea and vomiting that is not controlled by your nausea medication, call the clinic.   BELOW ARE SYMPTOMS THAT SHOULD BE REPORTED IMMEDIATELY:  *FEVER GREATER THAN 100.5 F  *CHILLS WITH OR WITHOUT FEVER  NAUSEA AND VOMITING THAT IS NOT CONTROLLED WITH YOUR NAUSEA MEDICATION  *UNUSUAL SHORTNESS OF BREATH  *UNUSUAL BRUISING OR BLEEDING  TENDERNESS IN MOUTH AND THROAT WITH OR WITHOUT PRESENCE OF ULCERS  *URINARY PROBLEMS  *BOWEL PROBLEMS  UNUSUAL RASH Items with * indicate a potential emergency and should be followed up as soon as possible.  Feel free to call the clinic should you have any questions or concerns. The clinic phone number is [phone].  Please show the [facility] at check-in to the Emergency Department and triage nurse.

## 2018-09-30 ENCOUNTER — Ambulatory Visit
Admission: RE | Admit: 2018-09-30 | Discharge: 2018-09-30 | Disposition: A | Discharge status: Home / Self Care | Payer: Medicare Other | Source: Ambulatory Visit | Attending: Radiation Oncology | Admitting: Radiation Oncology

## 2018-09-30 ENCOUNTER — Telehealth: Payer: Self-pay | Admitting: *Deleted

## 2018-09-30 DIAGNOSIS — Z510 Encounter for antineoplastic radiation therapy: Secondary | ICD-10-CM | POA: Diagnosis not present

## 2018-09-30 NOTE — Telephone Encounter (Signed)
Spoke to patient. Encouraged her to take imodium for diarrhea. She states that since taking her first dose that this has already improved her symptoms. She continues to eat and drink plenty of fluids. She knows that if symptoms worsen or if she develops new or worsening symptoms to feel free to contact us. She will continue taking imodium for now.

## 2018-09-30 NOTE — Telephone Encounter (Signed)
"  [name] [phone]).   Treated Friday, diarrhea started Saturday.   Had at least four or more liquid stools daily.   Two imodium started with last stool at 6:15 am.  Is it okay to take Imodium?  Magnesium affects stools.  Should I stop taking the magnesium?  Cassie has been working with me.   Drinking 64 oz or more of water daily, that's all I drink.   Eating meals with no N/V."  Reviewed foods to avoid to manage diarrhea.

## 2018-10-01 ENCOUNTER — Other Ambulatory Visit: Payer: Self-pay

## 2018-10-01 ENCOUNTER — Inpatient Hospital Stay (HOSPITAL_BASED_OUTPATIENT_CLINIC_OR_DEPARTMENT_OTHER): Payer: Medicare Other | Admitting: Medical

## 2018-10-01 ENCOUNTER — Inpatient Hospital Stay: Payer: Medicare Other

## 2018-10-01 ENCOUNTER — Ambulatory Visit
Admission: RE | Admit: 2018-10-01 | Discharge: 2018-10-01 | Disposition: A | Discharge status: Home / Self Care | Payer: Medicare Other | Source: Ambulatory Visit | Attending: Radiation Oncology | Admitting: Radiation Oncology

## 2018-10-01 ENCOUNTER — Telehealth: Payer: Self-pay | Admitting: *Deleted

## 2018-10-01 ENCOUNTER — Other Ambulatory Visit: Payer: Medicare Other

## 2018-10-01 DIAGNOSIS — I808 Phlebitis and thrombophlebitis of other sites: Secondary | ICD-10-CM

## 2018-10-01 DIAGNOSIS — C3431 Malignant neoplasm of lower lobe, right bronchus or lung: Secondary | ICD-10-CM | POA: Diagnosis not present

## 2018-10-01 DIAGNOSIS — Z79899 Other long term (current) drug therapy: Secondary | ICD-10-CM

## 2018-10-01 DIAGNOSIS — Z510 Encounter for antineoplastic radiation therapy: Secondary | ICD-10-CM | POA: Diagnosis not present

## 2018-10-01 DIAGNOSIS — Z792 Long term (current) use of antibiotics: Secondary | ICD-10-CM

## 2018-10-01 DIAGNOSIS — Z7982 Long term (current) use of aspirin: Secondary | ICD-10-CM

## 2018-10-01 LAB — CMP (CANCER CENTER ONLY)
ALT: 14 U/L (ref 0–44)
AST: 12 U/L — ABNORMAL LOW (ref 15–41)
Albumin: 3.6 g/dL (ref 3.5–5.0)
Alkaline Phosphatase: 115 U/L (ref 38–126)
Anion gap: 15 (ref 5–15)
BUN: 27 mg/dL — ABNORMAL HIGH (ref 8–23)
CHLORIDE: 95 mmol/L — AB (ref 98–111)
CO2: 26 mmol/L (ref 22–32)
Calcium: 8.1 mg/dL — ABNORMAL LOW (ref 8.9–10.3)
Creatinine: 1.35 mg/dL — ABNORMAL HIGH (ref 0.44–1.00)
GFR, EST NON AFRICAN AMERICAN: 41 mL/min — AB (ref >60)
GFR, Est AFR Am: 48 mL/min — ABNORMAL LOW (ref >60)
Glucose, Bld: 92 mg/dL (ref 70–99)
Potassium: 3.6 mmol/L (ref 3.5–5.1)
Sodium: 136 mmol/L (ref 135–145)
Total Bilirubin: 0.5 mg/dL (ref 0.3–1.2)
Total Protein: 6.5 g/dL (ref 6.5–8.1)

## 2018-10-01 LAB — CBC WITH DIFFERENTIAL (CANCER CENTER ONLY)
Abs Immature Granulocytes: 0 10*3/uL (ref 0.00–0.07)
Basophils Absolute: 0 10*3/uL (ref 0.0–0.1)
Basophils Relative: 0 %
Eosinophils Absolute: 0 10*3/uL (ref 0.0–0.5)
Eosinophils Relative: 0 %
HCT: 35 % — ABNORMAL LOW (ref 36.0–46.0)
Hemoglobin: 10.9 g/dL — ABNORMAL LOW (ref 12.0–15.0)
Lymphocytes Relative: 14 %
Lymphs Abs: 3.6 10*3/uL (ref 0.7–4.0)
MCH: 27 pg (ref 26.0–34.0)
MCHC: 31.1 g/dL (ref 30.0–36.0)
MCV: 86.8 fL (ref 80.0–100.0)
Monocytes Absolute: 0 10*3/uL — ABNORMAL LOW (ref 0.1–1.0)
Monocytes Relative: 0 %
Neutro Abs: 21.9 10*3/uL — ABNORMAL HIGH (ref 1.7–17.7)
Neutrophils Relative %: 86 %
PLATELETS: 118 10*3/uL — AB (ref 150–400)
RBC: 4.03 MIL/uL (ref 3.87–5.11)
RDW: 18.8 % — AB (ref 11.5–15.5)
WBC Count: 25.5 10*3/uL — ABNORMAL HIGH (ref 4.0–10.5)
nRBC: 0 % (ref 0.0–0.2)

## 2018-10-01 LAB — MAGNESIUM: Magnesium: 1.2 mg/dL — CL (ref 1.7–2.4)

## 2018-10-01 MED ORDER — CEPHALEXIN 500 MG PO CAPS: 500.0000 mg | ORAL_CAPSULE | Freq: Four times a day (QID) | ORAL | 0 refills | Status: DC

## 2018-10-01 MED ORDER — SONAFINE EX EMUL: 1.0000 "application " | Freq: Two times a day (BID) | CUTANEOUS | Status: DC

## 2018-10-01 NOTE — Progress Notes (Signed)
Pt presents with redness, tenderness, heat, swelling, and cording along posterior of L forearm.  Two recent IV accesses on L arm, however neither reported to be directly above/below/around redness.  Denies recent injury.  Denies CP or SOB.  No rash or fever.  Full sensation & ROM & strength L arm/hand.

## 2018-10-01 NOTE — Patient Instructions (Signed)
Phlebitis Phlebitis is soreness and swelling (inflammation) of a vein. Follow these instructions at home: Managing pain, stiffness, and swelling  If told, apply heat to the affected area. Do this as often as told by your doctor. Use the heat source that your doctor tells you to use. This may include a moist heat pack or a heating pad. ? Place a towel between your skin and the heat source. ? Leave the heat on for 20-30 minutes. ? Take off the heat if your skin turns bright red. This is very important if you cannot feel pain, heat, or cold. You may be more likely to get burned.  Raise (elevate) the affected area above the level of your heart while you are sitting or lying down. Medicines  Take over-the-counter and prescription medicines only as told by your doctor.  If you were prescribed an antibiotic medicine, take it as told by your doctor. Do not stop taking the antibiotic even if your condition gets better.  If you take medicines to thin your blood, carry a medical alert card or wear your medical alert jewelry. General instructions   If you have phlebitis in your legs: ? Do not stand or sit for a long time. ? Keep your legs moving. ? Get up and take short walks if you have to sit for a long time. ? Try to avoid bed rest that lasts for a long time. Regular sleep is not bed rest.  Wear compression stockings as told by your doctor. These stockings help: ? To reduce swelling in your legs. ? To prevent blood clots. ? To stop the condition from coming back.  Do not use any products that contain nicotine or tobacco, such as cigarettes and e-cigarettes. If you need help quitting, ask your doctor.  Keep all follow-up visits as told by your doctor. This is important. This may include any follow-up blood tests. Contact a doctor if:  You have strange bruises.  You have bleeding problems.  Your symptoms do not get better.  Your symptoms get worse.  You are taking medicine to treat  swelling (anti-inflammatory medicine) and you get belly (abdominal) pain. Get help right away if:  You have sudden chest pain.  You suddenly have trouble breathing.  You have a fever and your symptoms get worse.  You cough up blood.  You feel dizzy or you pass out.  You have very bad pain and swelling in the affected arm or leg. These symptoms may be an emergency. Do not wait to see if the symptoms will go away. Get medical help right away. Call your local emergency services (911 in the [place] Do not drive yourself to the hospital. Summary  Phlebitis is soreness and swelling (inflammation) of a vein.  Raise (elevate) the affected area above the level of your heart while you are sitting or lying down.  If told, apply heat to the affected area. Do this as often as told by your doctor. Use the heat source that your doctor tells you to use. This may include a moist heat pack or a heating pad.  Take over-the-counter and prescription medicines only as told by your doctor. This information is not intended to replace advice given to you by your health care provider. Make sure you discuss any questions you have with your health care provider. Document Released: 06/21/2009 Document Revised: 08/08/2016 Document Reviewed: 08/08/2016 Elsevier Interactive Patient Education  2019 [employer].

## 2018-10-01 NOTE — Telephone Encounter (Signed)
FYI "[facility]ENN [phone]).  I did have about eight watery stools yesterday but diarrhea has stopped.   My blood is low.   Today = 111/69.  Is this okay?  No dizziness just feel weak and pain.     Yesterday B/P = 120/80, Sun.= 124/88 Sat. =161/85 Fri. = 163/80 Thurs. = 150/88 Wed. = 135/83 In a lot of chronic pain I can't get assistance with.  Received Oxycodone 5 mg in hospital.  I cut these in half."  Encouraged to drink fluids and Gatorade. Call for any changes or dizziness.

## 2018-10-02 ENCOUNTER — Inpatient Hospital Stay: Payer: Medicare Other

## 2018-10-02 ENCOUNTER — Other Ambulatory Visit: Payer: Self-pay

## 2018-10-02 ENCOUNTER — Ambulatory Visit
Admission: RE | Admit: 2018-10-02 | Discharge: 2018-10-02 | Disposition: A | Discharge status: Home / Self Care | Payer: Medicare Other | Source: Ambulatory Visit | Attending: Radiation Oncology | Admitting: Radiation Oncology

## 2018-10-02 DIAGNOSIS — C3431 Malignant neoplasm of lower lobe, right bronchus or lung: Secondary | ICD-10-CM | POA: Diagnosis not present

## 2018-10-02 DIAGNOSIS — Z510 Encounter for antineoplastic radiation therapy: Secondary | ICD-10-CM | POA: Diagnosis not present

## 2018-10-02 MED ORDER — SODIUM CHLORIDE 0.9 % IV SOLN
6.0000 g | Freq: Once | INTRAVENOUS | Status: AC
  Administered 2018-10-02: 6 g via INTRAVENOUS
  Filled 2018-10-02: qty 12

## 2018-10-02 NOTE — Patient Instructions (Signed)
Hypomagnesemia  Hypomagnesemia is a condition in which the level of magnesium in the blood is low. Magnesium is a mineral that is found in many foods. It is used in many different processes in the body. Hypomagnesemia can affect every organ in the body. In severe cases, it can cause life-threatening problems.  What are the causes?  This condition may be caused by:   Not getting enough magnesium in your diet.   Malnutrition.   Problems with absorbing magnesium from the intestines.   Dehydration.   Alcohol abuse.   Vomiting.   Severe or chronic diarrhea.   Some medicines, including medicines that make you urinate more (diuretics).   Certain diseases, such as kidney disease, diabetes, celiac disease, and overactive thyroid.  What are the signs or symptoms?  Symptoms of this condition include:   Loss of appetite.   Nausea and vomiting.   Involuntary shaking or trembling of a body part (tremor).   Muscle weakness.   Tingling in the arms and legs.   Sudden tightening of muscles (muscle spasms).   Confusion.   Psychiatric issues, such as depression, irritability, or psychosis.   A feeling of fluttering of the heart.   Seizures.  These symptoms are more severe if magnesium levels drop suddenly.  How is this diagnosed?  This condition may be diagnosed based on:   Your symptoms and medical history.   A physical exam.   Blood and urine tests.  How is this treated?  Treatment depends on the cause and the severity of the condition. It may be treated with:   A magnesium supplement. This can be taken in pill form. If the condition is severe, magnesium is usually given through an IV.   Changes to your diet. You may be directed to eat foods that have a lot of magnesium, such as green leafy vegetables, peas, beans, and nuts.   Stopping any intake of alcohol.  Follow these instructions at home:          Make sure that your diet includes foods with magnesium. Foods that have a lot of magnesium in them  include:  ? Green leafy vegetables, such as spinach and broccoli.  ? Beans and peas.  ? Nuts and seeds, such as almonds and sunflower seeds.  ? Whole grains, such as whole grain bread and fortified cereals.   Take magnesium supplements if your health care provider tells you to do that. Take them as directed.   Take over-the-counter and prescription medicines only as told by your health care provider.   Have your magnesium levels monitored as told by your health care provider.   When you are active, drink fluids that contain electrolytes.   Avoid drinking alcohol.   Keep all follow-up visits as told by your health care provider. This is important.  Contact a health care provider if:   You get worse instead of better.   Your symptoms return.  Get help right away if you:   Develop severe muscle weakness.   Have trouble breathing.   Feel that your heart is racing.  Summary   Hypomagnesemia is a condition in which the level of magnesium in the blood is low.   Hypomagnesemia can affect every organ in the body.   Treatment may include eating more foods that contain magnesium, taking magnesium supplements, and not drinking alcohol.   Have your magnesium levels monitored as told by your health care provider.  This information is not intended to replace advice given   to you by your health care provider. Make sure you discuss any questions you have with your health care provider.  Document Released: 03/29/2005 Document Revised: 06/04/2017 Document Reviewed: 06/04/2017  Elsevier Interactive Patient Education  2019 [employer]

## 2018-10-02 NOTE — Progress Notes (Signed)
Pt ate and drank during IV magnesium infusion.  Tolerated well, reports feeling better at end of infusion.  Denies any questions/concerns at this time.  VU of d/c instructions and to follow up as needed.

## 2018-10-02 NOTE — Progress Notes (Signed)
[facility] Progress Note   [name] [id number] 26-Jun-1951 68 y.o.  [name] is managed by Dr. Fanny Bien. [name]  Actively treated with chemotherapy/immunotherapy/hormonal therapy: yes  Current therapy: Cisplatin and etoposide with Onpro support  Last treated: 09/25/2018 (cycle 2)  Next scheduled appointment with provider: 10/02/2018  Assessment: Plan:    Hypomagnesemia - Plan: DISCONTINUED: magnesium sulfate 6 g in sodium chloride 0.9 % 1,000 mL  Thrombophlebitis arm - Plan: cephALEXin (KEFLEX) 500 MG capsule  Small cell carcinoma of lower lobe of right lung (HCC)   Hypomagnesemia: Patient's labs returned with a magnesium level of 1.2 today.  She will return tomorrow for an infusion of 6 g of magnesium.  Thrombophlebitis of the left arm: The patient was instructed to continue her 325 mg aspirin daily and was given a prescription for Keflex 500 mg p.o. 4 times daily x7 days.  Limited stage small cell lung cancer: The patient is status post cycle 2 of cisplatin and etoposide with Onpro support.  She will be seen in follow-up on 10/02/2018.  Please see After Visit Summary for patient specific instructions.  Future Appointments  Date Time Provider [place]  10/03/2018  7:30 AM [facility] LINAC 1 [facility] None  10/04/2018  7:30 AM [facility] LINAC 1 [facility] None  10/07/2018  7:30 AM [facility] LINAC 1 [facility] None  10/07/2018 12:00 PM [facility] ROOM [facility] None  10/07/2018  1:30 PM [facility]-IR 1 [facility] [place]  10/08/2018  1:30 PM [facility] LINAC 1 [facility] None  10/08/2018  2:00 PM [facility] LAB 1 [facility] None  10/08/2018  2:15 PM [facility] [place] FLUSH [facility] None  10/09/2018  7:30 AM [facility] LINAC 1 [facility] None  10/10/2018  7:30 AM [facility] LINAC 1 [facility] None  10/11/2018  7:30 AM [facility] LINAC 1 [facility] None  10/11/2018  8:30 AM [facility]-CT 2 [facility]-CT [place]  10/14/2018  7:30 AM [facility]  LINAC 1 [facility] None  10/15/2018 10:30 AM [facility] LINAC 1 [facility] None  10/15/2018 11:00 AM [facility] LAB 2 [facility] None  10/15/2018 11:15 AM [facility] [place] FLUSH [facility] None  10/15/2018 11:30 AM Heilingoetter, Cassandra L, PA-C [facility] None  10/16/2018  7:30 AM [facility] LINAC 1 [facility] None  10/16/2018  8:00 AM [facility] INFUSION [facility] None  10/17/2018  1:00 PM [facility] LINAC 1 [facility] None  10/17/2018  2:00 PM [facility] INFUSION [facility] None  10/18/2018 12:45 PM [facility] LINAC 1 [facility] None  10/18/2018  2:30 PM [facility] INFUSION [facility] None  10/21/2018  7:30 AM [facility] LINAC 1 [facility] None  10/22/2018  1:30 PM [facility] LINAC 1 [facility] None  10/22/2018  2:15 PM [facility] LAB 2 [facility] None  10/22/2018  2:30 PM [facility] [place] FLUSH [facility] None  10/23/2018  7:30 AM [facility] LINAC 1 [facility] None  10/24/2018  7:30 AM [facility] LINAC 1 [facility] None  10/25/2018  7:30 AM [facility] LINAC 1 [facility] None  10/28/2018  7:30 AM [facility] LINAC 1 [facility] None  10/29/2018  1:30 PM [facility] LINAC 1 [facility] None  10/29/2018  2:00 PM [facility] LAB 6 [facility] None  10/29/2018  2:15 PM [facility] [place] FLUSH [facility] None  10/30/2018  7:30 AM [facility] LINAC 1 [facility] None  10/31/2018  7:30 AM [facility] LINAC 1 [facility] None  11/01/2018  7:30 AM [facility] LINAC 1 [facility] None  11/04/2018 10:00 AM [facility] LINAC 1 [facility] None  11/04/2018 10:45 AM [facility] LAB 2 [facility] None  11/04/2018 11:00 AM [facility] [place] FLUSH [facility] None  11/04/2018 11:30 AM Heilingoetter, Cassandra L,  PA-C [facility] None  11/05/2018  7:30 AM [facility] LINAC 1 [facility] None  11/05/2018  8:00 AM [facility] INFUSION [facility] None  11/06/2018  2:00 PM [facility] INFUSION [facility] None  11/07/2018  2:00 PM [facility] INFUSION [facility] None  11/12/2018  8:00 AM [facility] LAB 1 [facility] None   11/12/2018  8:15 AM [facility] [place] FLUSH [facility] None  11/13/2018 10:40 AM Rutherford Guys, MD PCP-PCP [facility]  11/19/2018  8:00 AM [facility] LAB 1 [facility] None  11/19/2018  8:15 AM [facility] [place] FLUSH [facility] None  11/25/2018 10:45 AM [facility] LAB 3 [facility] None  11/25/2018 11:00 AM [facility] [place] FLUSH [facility] None  11/25/2018 11:30 AM Heilingoetter, Cassandra L, PA-C [facility] None  11/26/2018  8:30 AM [facility] INFUSION [facility] None  11/27/2018  8:00 AM [facility] INFUSION [facility] None  11/28/2018  8:00 AM [facility] INFUSION [facility] None    No orders of the defined types were placed in this encounter.      Subjective:   Patient ID:  [name] is a 68 y.o. (DOB 10-01-1950) female.  Chief Complaint:  Chief Complaint  Patient presents with  . Edema    HPI [name]   is a 68 year old female with a history of a limited stage small cell lung cancer who is managed by Dr. Earlie Server and is status post cycle 2 of cisplatin and etoposide with Onpro support.  Her last cycle of chemotherapy was begun on 09/25/2018.  She presents to the clinic today with tenderness, warmth, and erythema of the left forearm at the site of her infusion.  She also reports hardness of the veins.  She denies fevers, chills, sweats, nausea, vomiting, constipation, or diarrhea.  She currently takes a 325 mg aspirin daily.  Medications: I have reviewed the patient's current medications.  Allergies: No Known Allergies  Past Medical History:  Diagnosis Date  . Arthritis   . Chronic pain   . Hyperlipidemia   . Hypertension   . Stroke ([place])    06/19/2018 minor    Past Surgical History:  Procedure Laterality Date  . BIOPSY  06/19/2018   Procedure: BIOPSY;  Surgeon: Laurence Spates, MD;  [facility];  Service: Endoscopy;;  . ENDARTERECTOMY Right 06/26/2018   Procedure: ENDARTERECTOMY CAROTID RIGHT;  Surgeon: Serafina Mitchell, MD;  Location: [facility] OR;  Service:  Vascular;  Laterality: Right;  . ESOPHAGOGASTRODUODENOSCOPY (EGD) WITH PROPOFOL N/A 06/19/2018   Procedure: ESOPHAGOGASTRODUODENOSCOPY (EGD) WITH PROPOFOL;  Surgeon: Laurence Spates, MD;  [facility];  Service: Endoscopy;  Laterality: N/A;  . PATCH ANGIOPLASTY Right 06/26/2018   Procedure: PATCH ANGIOPLASTY USING A XENOSURE 1cm x 6cm BIOLOGIC PATCH;  Surgeon: Serafina Mitchell, MD;  Location: [place];  Service: Vascular;  Laterality: Right;  Marland Kitchen VIDEO BRONCHOSCOPY WITH ENDOBRONCHIAL ULTRASOUND N/A 08/14/2018   Procedure: VIDEO BRONCHOSCOPY WITH ENDOBRONCHIAL ULTRASOUND;  Surgeon: Collene Gobble, MD;  [facility];  Service: Thoracic;  Laterality: N/A;    Family History  Problem Relation Age of Onset  . Cancer Mother        lung cancer  . Diabetes Other   . Hyperlipidemia Other   . Hypertension Other   . Cancer Other     Social History   Socioeconomic History  . Marital status: Single    Spouse name: Not on file  . Number of children: 0  . Years of education: Not on file  . Highest education level: Not on file  Occupational History  . Not on file  Social Needs  .  Financial resource strain: Not on file  . Food insecurity:    Worry: Not on file    Inability: Not on file  . Transportation needs:    Medical: Not on file    Non-medical: Not on file  Tobacco Use  . Smoking status: Former Smoker    Packs/day: 1.00    Years: 10.00    Pack years: 10.00    Types: Cigarettes    Last attempt to quit: 06/19/2018    Years since quitting: 0.2  . Smokeless tobacco: Never Used  . Tobacco comment: Currently using Nicorette Gum  Substance and Sexual Activity  . Alcohol use: Yes    Comment: wine 2 glasses a day   . Drug use: Never    Comment: Hemp Oil  . Sexual activity: Not Currently  Lifestyle  . Physical activity:    Days per week: Not on file    Minutes per session: Not on file  . Stress: Not on file  Relationships  . Social connections:    Talks on phone: Not on file     Gets together: Not on file    Attends religious service: Not on file    Active member of club or organization: Not on file    Attends meetings of clubs or organizations: Not on file    Relationship status: Not on file  . Intimate partner violence:    Fear of current or ex partner: Not on file    Emotionally abused: Not on file    Physically abused: Not on file    Forced sexual activity: Not on file  Other Topics Concern  . 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   Please see review of systems for further details on the patient's review from today.   Review of Systems:  Review of Systems  Constitutional: Negative for chills, diaphoresis and fever.  HENT: Negative for trouble swallowing and voice change.   Respiratory: Negative for cough, chest tightness, shortness of breath and wheezing.   Cardiovascular: Negative for chest pain and palpitations.  Gastrointestinal: Negative for abdominal pain, constipation, diarrhea, nausea and vomiting.  Musculoskeletal: Negative for back pain and myalgias.  Skin:       Tenderness, warmth, and erythema of the left forearm at the site of her infusion with hardness of the veins.   Neurological: Negative for dizziness, light-headedness and headaches.    Objective:   Physical Exam:  BP 99/69 (BP Location: Right Arm, Patient Position: Sitting)   Pulse 79   Temp 98 F (36.7 C) (Oral)   Resp 18   SpO2 100%  ECOG: 0  Physical Exam Constitutional:      General: She is not in acute distress.    Appearance: She is not diaphoretic.  HENT:     Head: Normocephalic and atraumatic.  Cardiovascular:     Rate and Rhythm: Normal rate and regular rhythm.     Heart sounds: Normal heart sounds. No murmur. No friction rub. No  gallop.   Pulmonary:     Effort: Pulmonary effort is normal. No respiratory distress.     Breath sounds: Normal breath sounds. No wheezing or rales.  Skin:    General: Skin is  warm and dry.     Findings: Erythema present. No rash.     Comments: Erythema and increased warmth of the left forearm.  Induration is noted over multiple blood vessels in the dorsal surface of the left forearm.  No exudate is noted.  Neurological:     Mental Status: She is alert.     Coordination: Coordination normal.     Gait: Gait normal.     Lab Review:     Component Value Date/Time   NA 136 10/01/2018 1423   NA 142 08/02/2018 1042   K 3.6 10/01/2018 1423   CL 95 (L) 10/01/2018 1423   CO2 26 10/01/2018 1423   GLUCOSE 92 10/01/2018 1423   BUN 27 (H) 10/01/2018 1423   BUN 9 08/02/2018 1042   CREATININE 1.35 (H) 10/01/2018 1423   CALCIUM 8.1 (L) 10/01/2018 1423   PROT 6.5 10/01/2018 1423   PROT 6.7 08/02/2018 1042   ALBUMIN 3.6 10/01/2018 1423   ALBUMIN 4.0 08/02/2018 1042   AST 12 (L) 10/01/2018 1423   ALT 14 10/01/2018 1423   ALKPHOS 115 10/01/2018 1423   BILITOT 0.5 10/01/2018 1423   GFRNONAA 41 (L) 10/01/2018 1423   GFRAA 48 (L) 10/01/2018 1423       Component Value Date/Time   WBC 25.5 (H) 10/01/2018 1423   WBC 7.6 08/09/2018 1225   RBC 4.03 10/01/2018 1423   HGB 10.9 (L) 10/01/2018 1423   HGB 12.2 08/02/2018 1042   HCT 35.0 (L) 10/01/2018 1423   HCT 39.6 08/02/2018 1042   PLT 118 (L) 10/01/2018 1423   PLT 350 08/02/2018 1042   MCV 86.8 10/01/2018 1423   MCV 91 08/02/2018 1042   MCH 27.0 10/01/2018 1423   MCHC 31.1 10/01/2018 1423   RDW 18.8 (H) 10/01/2018 1423   RDW 14.3 08/02/2018 1042   LYMPHSABS 3.6 10/01/2018 1423   MONOABS 0.0 (L) 10/01/2018 1423   EOSABS 0.0 10/01/2018 1423   BASOSABS 0.0 10/01/2018 1423   -------------------------------  Imaging from last 24 hours (if applicable):  Radiology interpretation: No results found.      This case was discussed with  Dr. Julien Nordmann. He expressed agreement with my management of this patient.

## 2018-10-03 ENCOUNTER — Other Ambulatory Visit: Payer: Self-pay | Admitting: Gastroenterology

## 2018-10-03 ENCOUNTER — Ambulatory Visit
Admission: RE | Admit: 2018-10-03 | Discharge: 2018-10-03 | Disposition: A | Discharge status: Home / Self Care | Payer: Medicare Other | Source: Ambulatory Visit | Attending: Radiation Oncology | Admitting: Radiation Oncology

## 2018-10-03 DIAGNOSIS — Z510 Encounter for antineoplastic radiation therapy: Secondary | ICD-10-CM | POA: Diagnosis not present

## 2018-10-04 ENCOUNTER — Ambulatory Visit
Admission: RE | Admit: 2018-10-04 | Discharge: 2018-10-04 | Disposition: A | Discharge status: Home / Self Care | Payer: Medicare Other | Source: Ambulatory Visit | Attending: Radiation Oncology | Admitting: Radiation Oncology

## 2018-10-04 ENCOUNTER — Other Ambulatory Visit: Payer: Self-pay | Admitting: Radiology

## 2018-10-04 ENCOUNTER — Other Ambulatory Visit: Payer: Self-pay | Admitting: Physician Assistant

## 2018-10-04 ENCOUNTER — Other Ambulatory Visit: Payer: Self-pay

## 2018-10-04 DIAGNOSIS — Z510 Encounter for antineoplastic radiation therapy: Secondary | ICD-10-CM | POA: Diagnosis not present

## 2018-10-04 DIAGNOSIS — R790 Abnormal level of blood mineral: Secondary | ICD-10-CM

## 2018-10-04 HISTORY — DX: Abnormal level of blood mineral: R79.0

## 2018-10-07 ENCOUNTER — Other Ambulatory Visit: Payer: Self-pay

## 2018-10-07 ENCOUNTER — Ambulatory Visit
Admission: RE | Admit: 2018-10-07 | Discharge: 2018-10-07 | Disposition: A | Discharge status: Home / Self Care | Payer: Medicare Other | Source: Ambulatory Visit | Attending: Radiation Oncology | Admitting: Radiation Oncology

## 2018-10-07 ENCOUNTER — Encounter (HOSPITAL_COMMUNITY): Payer: Self-pay

## 2018-10-07 ENCOUNTER — Ambulatory Visit (HOSPITAL_COMMUNITY)
Admission: RE | Admit: 2018-10-07 | Discharge: 2018-10-07 | Disposition: A | Discharge status: Home / Self Care | Payer: Medicare Other | Source: Ambulatory Visit | Attending: Internal Medicine | Admitting: Internal Medicine

## 2018-10-07 ENCOUNTER — Other Ambulatory Visit: Payer: Self-pay | Admitting: Internal Medicine

## 2018-10-07 DIAGNOSIS — Z79899 Other long term (current) drug therapy: Secondary | ICD-10-CM | POA: Insufficient documentation

## 2018-10-07 DIAGNOSIS — C3490 Malignant neoplasm of unspecified part of unspecified bronchus or lung: Secondary | ICD-10-CM | POA: Insufficient documentation

## 2018-10-07 DIAGNOSIS — Z9221 Personal history of antineoplastic chemotherapy: Secondary | ICD-10-CM | POA: Diagnosis not present

## 2018-10-07 DIAGNOSIS — G8929 Other chronic pain: Secondary | ICD-10-CM | POA: Diagnosis not present

## 2018-10-07 DIAGNOSIS — Z87891 Personal history of nicotine dependence: Secondary | ICD-10-CM | POA: Diagnosis not present

## 2018-10-07 DIAGNOSIS — Z7982 Long term (current) use of aspirin: Secondary | ICD-10-CM | POA: Insufficient documentation

## 2018-10-07 DIAGNOSIS — I10 Essential (primary) hypertension: Secondary | ICD-10-CM | POA: Insufficient documentation

## 2018-10-07 DIAGNOSIS — E785 Hyperlipidemia, unspecified: Secondary | ICD-10-CM | POA: Insufficient documentation

## 2018-10-07 DIAGNOSIS — M1990 Unspecified osteoarthritis, unspecified site: Secondary | ICD-10-CM | POA: Diagnosis not present

## 2018-10-07 DIAGNOSIS — I878 Other specified disorders of veins: Secondary | ICD-10-CM

## 2018-10-07 DIAGNOSIS — Z510 Encounter for antineoplastic radiation therapy: Secondary | ICD-10-CM | POA: Diagnosis not present

## 2018-10-07 DIAGNOSIS — Z8673 Personal history of transient ischemic attack (TIA), and cerebral infarction without residual deficits: Secondary | ICD-10-CM | POA: Insufficient documentation

## 2018-10-07 HISTORY — PX: IR IMAGING GUIDED PORT INSERTION: IMG5740

## 2018-10-07 HISTORY — DX: Abnormal level of blood mineral: R79.0

## 2018-10-07 LAB — CBC WITH DIFFERENTIAL/PLATELET
Abs Immature Granulocytes: 1.89 10*3/uL — ABNORMAL HIGH (ref 0.00–0.07)
BASOS ABS: 0 10*3/uL (ref 0.0–0.1)
Basophils Relative: 0 %
Eosinophils Absolute: 0 10*3/uL (ref 0.0–0.5)
Eosinophils Relative: 0 %
HCT: 32.4 % — ABNORMAL LOW (ref 36.0–46.0)
Hemoglobin: 10 g/dL — ABNORMAL LOW (ref 12.0–15.0)
Immature Granulocytes: 20 %
Lymphocytes Relative: 17 %
Lymphs Abs: 1.6 10*3/uL (ref 0.7–4.0)
MCH: 27.3 pg (ref 26.0–34.0)
MCHC: 30.9 g/dL (ref 30.0–36.0)
MCV: 88.5 fL (ref 80.0–100.0)
Monocytes Absolute: 1 10*3/uL (ref 0.1–1.0)
Monocytes Relative: 11 %
Neutro Abs: 4.8 10*3/uL (ref 1.7–7.7)
Neutrophils Relative %: 52 %
Platelets: 42 10*3/uL — ABNORMAL LOW (ref 150–400)
RBC: 3.66 MIL/uL — ABNORMAL LOW (ref 3.87–5.11)
RDW: 18.6 % — ABNORMAL HIGH (ref 11.5–15.5)
WBC Morphology: INCREASED
WBC: 9.4 10*3/uL (ref 4.0–10.5)
nRBC: 0.2 % (ref 0.0–0.2)

## 2018-10-07 LAB — PROTIME-INR
INR: 0.9 (ref 0.8–1.2)
PROTHROMBIN TIME: 12.2 s (ref 11.4–15.2)

## 2018-10-07 MED ORDER — LIDOCAINE-EPINEPHRINE (PF) 1 %-1:200000 IJ SOLN
INTRAMUSCULAR | Status: AC | PRN
  Administered 2018-10-07: 10 mL

## 2018-10-07 MED ORDER — SODIUM CHLORIDE 0.9 % IV SOLN: INTRAVENOUS | Status: DC

## 2018-10-07 MED ORDER — FENTANYL CITRATE (PF) 100 MCG/2ML IJ SOLN
INTRAMUSCULAR | Status: AC
  Filled 2018-10-07: qty 2

## 2018-10-07 MED ORDER — FENTANYL CITRATE (PF) 100 MCG/2ML IJ SOLN
INTRAMUSCULAR | Status: AC | PRN
  Administered 2018-10-07 (×2): 50 ug via INTRAVENOUS

## 2018-10-07 MED ORDER — HEPARIN SOD (PORK) LOCK FLUSH 100 UNIT/ML IV SOLN
INTRAVENOUS | Status: AC
  Filled 2018-10-07: qty 5

## 2018-10-07 MED ORDER — MIDAZOLAM HCL 2 MG/2ML IJ SOLN
INTRAMUSCULAR | Status: AC | PRN
  Administered 2018-10-07 (×4): 1 mg via INTRAVENOUS

## 2018-10-07 MED ORDER — CEFAZOLIN SODIUM-DEXTROSE 2-4 GM/100ML-% IV SOLN
INTRAVENOUS | Status: AC
  Filled 2018-10-07: qty 100

## 2018-10-07 MED ORDER — LIDOCAINE HCL 1 % IJ SOLN
INTRAMUSCULAR | Status: AC
  Filled 2018-10-07: qty 20

## 2018-10-07 MED ORDER — LIDOCAINE-EPINEPHRINE (PF) 2 %-1:200000 IJ SOLN
INTRAMUSCULAR | Status: AC
  Filled 2018-10-07: qty 20

## 2018-10-07 MED ORDER — CEFAZOLIN SODIUM-DEXTROSE 2-4 GM/100ML-% IV SOLN
2.0000 g | INTRAVENOUS | Status: AC
  Administered 2018-10-07: 2 g via INTRAVENOUS

## 2018-10-07 MED ORDER — HEPARIN SOD (PORK) LOCK FLUSH 100 UNIT/ML IV SOLN
INTRAVENOUS | Status: AC | PRN
  Administered 2018-10-07: 500 [IU] via INTRAVENOUS

## 2018-10-07 MED ORDER — MIDAZOLAM HCL 2 MG/2ML IJ SOLN
INTRAMUSCULAR | Status: AC
  Filled 2018-10-07: qty 4

## 2018-10-07 MED ORDER — LIDOCAINE HCL (PF) 1 % IJ SOLN
INTRAMUSCULAR | Status: AC | PRN
  Administered 2018-10-07: 5 mL

## 2018-10-07 NOTE — Procedures (Signed)
Interventional Radiology Procedure:   Indications: Small cell lung cancer and poor venous access  Procedure: Port placement  Findings: Right jugular port, tip at SVC/RA junction  Complications: None     EBL: Minimal, less than 5 ml  Plan: Discharge to home.  Keep port site dry for 24 hours.   Heather Heier R. Anselm Pancoast, MD  Pager: [phone]

## 2018-10-07 NOTE — H&P (Signed)
Chief Complaint: Patient was seen in consultation today for port placement.  Referring Physician(s): Mohamed,Mohamed  Supervising Physician: Markus Daft  Patient Status: [facility] - Out-pt  History of Present Illness: [name] is a 68 y.o. female with a past medical history significant for arthritis, HLD, HTN, CVA and small cell lung cancer diagnosed 07/2018 followed by Dr. Julien Nordmann who presents today for port placement. [name] has been undergone two chemotherapy treatments since 09/03/18 with next anticipated treatment later this week. She is also scheduled to begin radiation treatment soon. She was receiving chemotherapy via peripheral IV however she has had some difficulty with access as well as recently thrombophlebitis of the left arm for which she is currently on Keflex 500 mg PO QID.   Patient reports that she is concerned about where the port will be placed because she "sleeps like a rattlesnake" and is concerned that it will damage the port. She is also wondering when she can shower after port placement and if it can be used for her next treatment. She is wondering if it can be removed after her treatments have been completed this summer. She states she is a little anxious about there being pain tonight when she goes home.   Past Medical History:  Diagnosis Date  . Arthritis   . Chronic pain   . Hyperlipidemia   . Hypertension   . Low blood magnesium 10/04/2018  . Stroke ([place])    06/19/2018 minor    Past Surgical History:  Procedure Laterality Date  . BIOPSY  06/19/2018   Procedure: BIOPSY;  Surgeon: Laurence Spates, MD;  [facility];  Service: Endoscopy;;  . ENDARTERECTOMY Right 06/26/2018   Procedure: ENDARTERECTOMY CAROTID RIGHT;  Surgeon: Serafina Mitchell, MD;  Location: [facility] OR;  Service: Vascular;  Laterality: Right;  . ESOPHAGOGASTRODUODENOSCOPY (EGD) WITH PROPOFOL N/A 06/19/2018   Procedure: ESOPHAGOGASTRODUODENOSCOPY (EGD) WITH PROPOFOL;  Surgeon:  Laurence Spates, MD;  [facility];  Service: Endoscopy;  Laterality: N/A;  . PATCH ANGIOPLASTY Right 06/26/2018   Procedure: PATCH ANGIOPLASTY USING A XENOSURE 1cm x 6cm BIOLOGIC PATCH;  Surgeon: Serafina Mitchell, MD;  Location: [place];  Service: Vascular;  Laterality: Right;  Marland Kitchen VIDEO BRONCHOSCOPY WITH ENDOBRONCHIAL ULTRASOUND N/A 08/14/2018   Procedure: VIDEO BRONCHOSCOPY WITH ENDOBRONCHIAL ULTRASOUND;  Surgeon: Collene Gobble, MD;  [facility];  Service: Thoracic;  Laterality: N/A;    Allergies: Patient has no known allergies.  Medications: Prior to Admission medications   Medication Sig Start Date End Date Taking? Authorizing Provider  atorvastatin (LIPITOR) 80 MG tablet Take 1 tablet (80 mg total) by mouth daily at 6 PM. 08/06/18  Yes Rutherford Guys, MD  cephALEXin (KEFLEX) 500 MG capsule Take 1 capsule (500 mg total) by mouth 4 (four) times daily. 10/01/18  Yes Tanner, Lyndon Code., PA-C  chlorthalidone (HYGROTON) 25 MG tablet Take 1 tablet (25 mg total) by mouth daily. 08/02/18  Yes Rutherford Guys, MD  lisinopril (PRINIVIL,ZESTRIL) 40 MG tablet Take 1 tablet (40 mg total) by mouth daily. 08/02/18  Yes Rutherford Guys, MD  magnesium oxide (MAG-OX) 400 MG tablet Take 1 tablet (400 mg total) by mouth 3 (three) times daily. 10/04/18  Yes Heilingoetter, Cassandra L, PA-C  oxyCODONE (OXY IR/ROXICODONE) 5 MG immediate release tablet Take 1-2 tablets (5-10 mg total) by mouth every 4 (four) hours as needed for moderate pain. Patient taking differently: Take 2.5 mg by mouth every 4 (four) hours as needed for moderate pain.  06/27/18  Yes Georgette Shell, MD  pantoprazole (PROTONIX) 40 MG tablet Take 1 tablet (40 mg total) by mouth 2 (two) times daily before a meal. 06/20/18  Yes Danford, Suann Larry, MD  prochlorperazine (COMPAZINE) 10 MG tablet Take 1 tablet (10 mg total) by mouth every 6 (six) hours as needed for nausea or vomiting. 09/11/18  Yes Heilingoetter, Cassandra L, PA-C   acetaminophen (TYLENOL) 650 MG CR tablet Take 650 mg by mouth every 8 (eight) hours as needed for pain.    [provider]  aspirin EC 325 MG EC tablet Take 1 tablet (325 mg total) by mouth daily. STOP 12/15 FOR BRONCH AND BIOPSY 12/18.Marland KitchenRESUME AFTER BIOPSY Patient taking differently: Take 325 mg by mouth daily.  06/27/18   Georgette Shell, MD  Ferrous Gluconate-C-Folic Acid (IRON-C PO) Take 65 mg by mouth 3 (three) times a week.     [provider]     Family History  Problem Relation Age of Onset  . Cancer Mother        lung cancer  . Diabetes Other   . Hyperlipidemia Other   . Hypertension Other   . Cancer Other     Social History   Socioeconomic History  . Marital status: Single    Spouse name: Not on file  . Number of children: 0  . Years of education: Not on file  . Highest education level: Not on file  Occupational History  . Not on file  Social Needs  . Financial resource strain: Not on file  . Food insecurity:    Worry: Not on file    Inability: Not on file  . Transportation needs:    Medical: Not on file    Non-medical: Not on file  Tobacco Use  . Smoking status: Former Smoker    Packs/day: 1.00    Years: 10.00    Pack years: 10.00    Types: Cigarettes    Last attempt to quit: 06/19/2018    Years since quitting: 0.3  . Smokeless tobacco: Never Used  . Tobacco comment: Currently using Nicorette Gum  Substance and Sexual Activity  . Alcohol use: Yes    Comment: wine 2 glasses a day   . Drug use: Never    Comment: Hemp Oil  . Sexual activity: Not Currently  Lifestyle  . Physical activity:    Days per week: Not on file    Minutes per session: Not on file  . Stress: Not on file  Relationships  . Social connections:    Talks on phone: Not on file    Gets together: Not on file    Attends religious service: Not on file    Active member of club or organization: Not on file    Attends meetings of clubs or organizations: Not on file     Relationship status: Not on file  Other Topics Concern  . 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  All other systems are negative.  Review of Systems  Constitutional: Positive for activity change (decreased due to fatigue), appetite change (decreased) and fatigue. Negative for chills and fever.  Respiratory: Positive for shortness of breath (with exertion). Negative for cough.  Cardiovascular: Negative for chest pain.  Gastrointestinal: Positive for diarrhea (improving with imodium) and nausea (sometimes after treatment - none currently). Negative for abdominal pain and vomiting.  Musculoskeletal: Negative for back pain.  Skin: Negative for color change.  Neurological: Negative for headaches.    Vital Signs: BP (!) 145/86   Pulse 81   Temp 98.3 F (36.8 C) (Oral)   Resp 18   SpO2 100%   Physical Exam Vitals signs reviewed.  Constitutional:      General: She is not in acute distress.    Appearance: She is ill-appearing.  HENT:     Head: Normocephalic.  Cardiovascular:     Rate and Rhythm: Normal rate and regular rhythm.  Pulmonary:     Effort: Pulmonary effort is normal.     Breath sounds: Normal breath sounds.  Abdominal:     General: There is no distension.     Palpations: Abdomen is soft.     Tenderness: There is no abdominal tenderness.  Skin:    General: Skin is warm and dry.     Comments: (+) mild hardness to underside of left forearm at previous IV site - no erythema, warmth or tenderness to palpation. Current left hand IV site unremarkable.   Neurological:     Mental Status: She is alert and oriented to person, place, and time.  Psychiatric:        Mood and Affect: Mood normal.        Behavior: Behavior normal.        Thought Content: Thought content  normal.        Judgment: Judgment normal.      MD Evaluation Airway: WNL(Upper and lower dentures) Heart: WNL Abdomen: WNL Chest/ Lungs: WNL ASA  Classification: 3 Mallampati/Airway Score: One   Imaging: No results found.  Labs:  CBC: Recent Labs    09/11/18 1348 09/18/18 0752 09/24/18 1051 10/01/18 1423  WBC 4.9 1.8* 8.2 25.5*  HGB 11.1* 11.0* 11.3* 10.9*  HCT 36.0 36.4 38.0 35.0*  PLT 114* 181 304 118*    COAGS: Recent Labs    06/19/18 0836 06/21/18 1213 06/26/18 0636 08/09/18 1225  INR 1.01 0.90 1.01 0.94  APTT  --   --   --  30    BMP: Recent Labs    09/11/18 1348 09/18/18 0752 09/24/18 1051 10/01/18 1423  NA 138 141 140 136  K 4.0 4.5 4.3 3.6  CL 101 103 103 95*  CO2 26 29 27 26   GLUCOSE 96 110* 109* 92  BUN 17 12 14  27*  CALCIUM 8.7* 9.1 8.9 8.1*  CREATININE 1.09* 0.96 1.07* 1.35*  GFRNONAA 53* >60 54* 41*  GFRAA >60 >60 >60 48*    LIVER FUNCTION TESTS: Recent Labs    09/11/18 1348 09/18/18 0752 09/24/18 1051 10/01/18 1423  BILITOT 0.5 0.2* <0.2* 0.5  AST 13* 12* 16 12*  ALT 10 10 12 14   ALKPHOS 61 74 102 115  PROT 6.8 6.4* 6.6 6.5  ALBUMIN 3.5 3.4* 3.5 3.6    TUMOR MARKERS: No results for input(s): AFPTM, CEA, CA199, CHROMGRNA in the last 8760 hours.  Assessment and Plan:  68 y/o F with history of small cell carcinoma followed by Dr. Julien Nordmann s/p two cycles of chemotherapy who presents today for port placement due to poor venous access/history of thrombophlebitis with infusions.  Patient has been NPO since last night, she does not take blood thinning medications, she took all of her usual AM medications with two  small sips of water this morning. Afebrile, pre-procedure lab work pending at the time of this note writing.  Risks and benefits of image guided port-a-catheter placement was discussed with the patient including, but not limited to bleeding, infection, pneumothorax, or fibrin sheath development and need for  additional procedures.  All of the patient's questions were answered, patient is agreeable to proceed.  Consent signed and in chart.  Thank you for this interesting consult.  I greatly enjoyed meeting KAE LAUMAN and look forward to participating in their care.  A copy of this report was sent to the requesting provider on this date.  Electronically Signed: Joaquim Nam, PA-C 10/07/2018, 1:25 PM   I spent a total of 30 Minutes in face to face in clinical consultation, greater than 50% of which was counseling/coordinating care for port placement.

## 2018-10-07 NOTE — Discharge Instructions (Signed)
Implanted Port Insertion, Care After °This sheet gives you information about how to care for yourself after your procedure. Your health care provider may also give you more specific instructions. If you have problems or questions, contact your health care provider. °What can I expect after the procedure? °After the procedure, it is common to have: °· Discomfort at the port insertion site. °· Bruising on the skin over the port. This should improve over 3-4 days. °Follow these instructions at home: °Port care °· After your port is placed, you will get a manufacturer's information card. The card has information about your port. Keep this card with you at all times. °· Take care of the port as told by your health care provider. Ask your health care provider if you or a family member can get training for taking care of the port at home. A home health care nurse may also take care of the port. °· Make sure to remember what type of port you have. °Incision care ° °  ° °· Follow instructions from your health care provider about how to take care of your port insertion site. Make sure you: °? Wash your hands with soap and water before and after you change your bandage (dressing). If soap and water are not available, use hand sanitizer. °? Change your dressing as told by your health care provider. °? Leave stitches (sutures), skin glue, or adhesive strips in place. These skin closures may need to stay in place for 2 weeks or longer. If adhesive strip edges start to loosen and curl up, you may trim the loose edges. Do not remove adhesive strips completely unless your health care provider tells you to do that. °· Check your port insertion site every day for signs of infection. Check for: °? Redness, swelling, or pain. °? Fluid or blood. °? Warmth. °? Pus or a bad smell. °Activity °· Return to your normal activities as told by your health care provider. Ask your health care provider what activities are safe for you. °· Do not  lift anything that is heavier than 10 lb (4.5 kg), or the limit that you are told, until your health care provider says that it is safe. °General instructions °· Take over-the-counter and prescription medicines only as told by your health care provider. °· Do not take baths, swim, or use a hot tub until your health care provider approves. Ask your health care provider if you may take showers. You may only be allowed to take sponge baths. °· Do not drive for 24 hours if you were given a sedative during your procedure. °· Wear a medical alert bracelet in case of an emergency. This will tell any health care providers that you have a port. °· Keep all follow-up visits as told by your health care provider. This is important. °Contact a health care provider if: °· You cannot flush your port with saline as directed, or you cannot draw blood from the port. °· You have a fever or chills. °· You have redness, swelling, or pain around your port insertion site. °· You have fluid or blood coming from your port insertion site. °· Your port insertion site feels warm to the touch. °· You have pus or a bad smell coming from the port insertion site. °Get help right away if: °· You have chest pain or shortness of breath. °· You have bleeding from your port that you cannot control. °Summary °· Take care of the port as told by your health   care provider. Keep the manufacturer's information card with you at all times. °· Change your dressing as told by your health care provider. °· Contact a health care provider if you have a fever or chills or if you have redness, swelling, or pain around your port insertion site. °· Keep all follow-up visits as told by your health care provider. °This information is not intended to replace advice given to you by your health care provider. Make sure you discuss any questions you have with your health care provider. °Document Released: 04/23/2013 Document Revised: 01/29/2018 Document Reviewed:  01/29/2018 °Elsevier Interactive Patient Education © 2019 [employer] ° °Moderate Conscious Sedation, Adult, Care After °These instructions provide you with information about caring for yourself after your procedure. Your health care provider may also give you more specific instructions. Your treatment has been planned according to current medical practices, but problems sometimes occur. Call your health care provider if you have any problems or questions after your procedure. °What can I expect after the procedure? °After your procedure, it is common: °· To feel sleepy for several hours. °· To feel clumsy and have poor balance for several hours. °· To have poor judgment for several hours. °· To vomit if you eat too soon. °Follow these instructions at home: °For at least 24 hours after the procedure: ° °· Do not: °? Participate in activities where you could fall or become injured. °? Drive. °? Use heavy machinery. °? Drink alcohol. °? Take sleeping pills or medicines that cause drowsiness. °? Make important decisions or sign legal documents. °? Take care of children on your own. °· Rest. °Eating and drinking °· Follow the diet recommended by your health care provider. °· If you vomit: °? Drink water, juice, or soup when you can drink without vomiting. °? Make sure you have little or no nausea before eating solid foods. °General instructions °· Have a responsible adult stay with you until you are awake and alert. °· Take over-the-counter and prescription medicines only as told by your health care provider. °· If you smoke, do not smoke without supervision. °· Keep all follow-up visits as told by your health care provider. This is important. °Contact a health care provider if: °· You keep feeling nauseous or you keep vomiting. °· You feel light-headed. °· You develop a rash. °· You have a fever. °Get help right away if: °· You have trouble breathing. °This information is not intended to replace advice given to you  by your health care provider. Make sure you discuss any questions you have with your health care provider. °Document Released: 04/23/2013 Document Revised: 12/06/2015 Document Reviewed: 10/23/2015 °Elsevier Interactive Patient Education © 2019 [employer] ° °

## 2018-10-08 ENCOUNTER — Other Ambulatory Visit: Payer: Self-pay

## 2018-10-08 ENCOUNTER — Inpatient Hospital Stay: Payer: Medicare Other

## 2018-10-08 ENCOUNTER — Other Ambulatory Visit: Payer: Self-pay | Admitting: Medical Oncology

## 2018-10-08 ENCOUNTER — Ambulatory Visit
Admission: RE | Admit: 2018-10-08 | Discharge: 2018-10-08 | Disposition: A | Discharge status: Home / Self Care | Payer: Medicare Other | Source: Ambulatory Visit | Attending: Radiation Oncology | Admitting: Radiation Oncology

## 2018-10-08 ENCOUNTER — Telehealth: Payer: Self-pay | Admitting: *Deleted

## 2018-10-08 ENCOUNTER — Telehealth: Payer: Self-pay | Admitting: Medical Oncology

## 2018-10-08 ENCOUNTER — Other Ambulatory Visit: Payer: Self-pay | Admitting: Internal Medicine

## 2018-10-08 DIAGNOSIS — Z510 Encounter for antineoplastic radiation therapy: Secondary | ICD-10-CM | POA: Diagnosis not present

## 2018-10-08 DIAGNOSIS — C3431 Malignant neoplasm of lower lobe, right bronchus or lung: Secondary | ICD-10-CM

## 2018-10-08 DIAGNOSIS — Z95828 Presence of other vascular implants and grafts: Secondary | ICD-10-CM

## 2018-10-08 LAB — CMP (CANCER CENTER ONLY)
ALT: 12 U/L (ref 0–44)
AST: 17 U/L (ref 15–41)
Albumin: 3.8 g/dL (ref 3.5–5.0)
Alkaline Phosphatase: 109 U/L (ref 38–126)
Anion gap: 13 (ref 5–15)
BUN: 17 mg/dL (ref 8–23)
CO2: 25 mmol/L (ref 22–32)
Calcium: 9 mg/dL (ref 8.9–10.3)
Chloride: 102 mmol/L (ref 98–111)
Creatinine: 1.6 mg/dL — ABNORMAL HIGH (ref 0.44–1.00)
GFR, Est AFR Am: 39 mL/min — ABNORMAL LOW (ref >60)
GFR, Estimated: 33 mL/min — ABNORMAL LOW (ref >60)
GLUCOSE: 90 mg/dL (ref 70–99)
Potassium: 3.8 mmol/L (ref 3.5–5.1)
Sodium: 140 mmol/L (ref 135–145)
TOTAL PROTEIN: 6.8 g/dL (ref 6.5–8.1)

## 2018-10-08 LAB — MAGNESIUM: Magnesium: 1.5 mg/dL — ABNORMAL LOW (ref 1.7–2.4)

## 2018-10-08 LAB — PATHOLOGIST SMEAR REVIEW

## 2018-10-08 MED ORDER — LIDOCAINE-PRILOCAINE 2.5-2.5 % EX CREA: 1.0000 "application " | TOPICAL_CREAM | CUTANEOUS | 0 refills | Status: DC | PRN

## 2018-10-08 MED ORDER — OXYCODONE-ACETAMINOPHEN 5-325 MG PO TABS: 1.0000 | ORAL_TABLET | Freq: Three times a day (TID) | ORAL | 0 refills | Status: DC | PRN

## 2018-10-08 NOTE — Telephone Encounter (Signed)
Let's see what Dr. Anselm Pancoast think. Does she need reevaluation by IR or just give her some Percocet for pain?

## 2018-10-08 NOTE — Telephone Encounter (Signed)
Pt apologized for her conversation. Per Dr Anselm Pancoast I asked pt if she noted any swelling and she said she did not and  I explained he said she may have a muscle spasm that may interfere with her turning her neck.

## 2018-10-08 NOTE — Telephone Encounter (Signed)
Pain at port a cath in neck area. Port procedure yesterday. Last night she did not sleep .If she moves by herself it hurts in neck area on right side. Pain is knife like and constant.  She is taking Tylenol.  Expressed frustration and anger. When I was asking about other symptoms she said "I am just aggravated, he is not going to do anything,  You are not going to do anything  and I am just going to hang up". Pt hung up phone. Plts 43k yesterday.

## 2018-10-08 NOTE — Telephone Encounter (Signed)
RETURNED PATIENT'S PHONE CALL, LVM FOR A RETURN CALL 

## 2018-10-09 ENCOUNTER — Ambulatory Visit
Admission: RE | Admit: 2018-10-09 | Discharge: 2018-10-09 | Disposition: A | Discharge status: Home / Self Care | Payer: Medicare Other | Source: Ambulatory Visit | Attending: Radiation Oncology | Admitting: Radiation Oncology

## 2018-10-09 DIAGNOSIS — Z510 Encounter for antineoplastic radiation therapy: Secondary | ICD-10-CM | POA: Diagnosis not present

## 2018-10-10 ENCOUNTER — Ambulatory Visit
Admission: RE | Admit: 2018-10-10 | Discharge: 2018-10-10 | Disposition: A | Discharge status: Home / Self Care | Payer: Medicare Other | Source: Ambulatory Visit | Attending: Radiation Oncology | Admitting: Radiation Oncology

## 2018-10-10 DIAGNOSIS — Z510 Encounter for antineoplastic radiation therapy: Secondary | ICD-10-CM | POA: Diagnosis not present

## 2018-10-10 NOTE — Progress Notes (Signed)
Talked with patient regarding procedure tomorrow morning. Pt denied fever, cold /flu symptoms, SOB in the last 14days.Pt denied being out of the state of [place] for the last 14 days.

## 2018-10-11 ENCOUNTER — Ambulatory Visit (HOSPITAL_COMMUNITY)
Admission: RE | Admit: 2018-10-11 | Discharge: 2018-10-11 | Disposition: A | Discharge status: Home / Self Care | Payer: Medicare Other | Source: Ambulatory Visit | Attending: Physician Assistant | Admitting: Physician Assistant

## 2018-10-11 ENCOUNTER — Encounter (HOSPITAL_COMMUNITY)
Admission: RE | Disposition: A | Discharge status: Home / Self Care | Payer: Self-pay | Source: Home / Self Care | Attending: Gastroenterology

## 2018-10-11 ENCOUNTER — Encounter (HOSPITAL_COMMUNITY): Payer: Self-pay

## 2018-10-11 ENCOUNTER — Ambulatory Visit
Admission: RE | Admit: 2018-10-11 | Discharge: 2018-10-11 | Disposition: A | Discharge status: Home / Self Care | Payer: Medicare Other | Source: Ambulatory Visit | Attending: Radiation Oncology | Admitting: Radiation Oncology

## 2018-10-11 ENCOUNTER — Other Ambulatory Visit: Payer: Self-pay

## 2018-10-11 ENCOUNTER — Ambulatory Visit (HOSPITAL_COMMUNITY)
Admission: RE | Admit: 2018-10-11 | Discharge: 2018-10-11 | Disposition: A | Discharge status: Home / Self Care | Payer: Medicare Other | Attending: Gastroenterology | Admitting: Gastroenterology

## 2018-10-11 DIAGNOSIS — I10 Essential (primary) hypertension: Secondary | ICD-10-CM | POA: Diagnosis not present

## 2018-10-11 DIAGNOSIS — K649 Unspecified hemorrhoids: Secondary | ICD-10-CM | POA: Diagnosis not present

## 2018-10-11 DIAGNOSIS — K621 Rectal polyp: Secondary | ICD-10-CM | POA: Diagnosis not present

## 2018-10-11 DIAGNOSIS — R933 Abnormal findings on diagnostic imaging of other parts of digestive tract: Secondary | ICD-10-CM | POA: Insufficient documentation

## 2018-10-11 DIAGNOSIS — Z510 Encounter for antineoplastic radiation therapy: Secondary | ICD-10-CM | POA: Diagnosis not present

## 2018-10-11 DIAGNOSIS — Z8673 Personal history of transient ischemic attack (TIA), and cerebral infarction without residual deficits: Secondary | ICD-10-CM | POA: Insufficient documentation

## 2018-10-11 DIAGNOSIS — Z79899 Other long term (current) drug therapy: Secondary | ICD-10-CM | POA: Insufficient documentation

## 2018-10-11 DIAGNOSIS — Z8719 Personal history of other diseases of the digestive system: Secondary | ICD-10-CM | POA: Diagnosis not present

## 2018-10-11 DIAGNOSIS — C3431 Malignant neoplasm of lower lobe, right bronchus or lung: Secondary | ICD-10-CM | POA: Insufficient documentation

## 2018-10-11 DIAGNOSIS — Z7982 Long term (current) use of aspirin: Secondary | ICD-10-CM | POA: Diagnosis not present

## 2018-10-11 DIAGNOSIS — Z87891 Personal history of nicotine dependence: Secondary | ICD-10-CM | POA: Insufficient documentation

## 2018-10-11 HISTORY — PX: BIOPSY: SHX5522

## 2018-10-11 HISTORY — DX: Malignant (primary) neoplasm, unspecified: C80.1

## 2018-10-11 HISTORY — PX: FLEXIBLE SIGMOIDOSCOPY: SHX5431

## 2018-10-11 SURGERY — SIGMOIDOSCOPY, FLEXIBLE
Anesthesia: Moderate Sedation

## 2018-10-11 MED ORDER — MIDAZOLAM HCL (PF) 10 MG/2ML IJ SOLN
INTRAMUSCULAR | Status: DC | PRN
  Administered 2018-10-11: 1 mg via INTRAVENOUS
  Administered 2018-10-11: 2 mg via INTRAVENOUS

## 2018-10-11 MED ORDER — FENTANYL CITRATE (PF) 100 MCG/2ML IJ SOLN
INTRAMUSCULAR | Status: DC | PRN
  Administered 2018-10-11 (×2): 25 ug via INTRAVENOUS

## 2018-10-11 MED ORDER — MIDAZOLAM HCL (PF) 5 MG/ML IJ SOLN
INTRAMUSCULAR | Status: AC
  Filled 2018-10-11: qty 2

## 2018-10-11 MED ORDER — LACTATED RINGERS IV SOLN
INTRAVENOUS | Status: AC | PRN
  Administered 2018-10-11: 1000 mL via INTRAVENOUS

## 2018-10-11 MED ORDER — FENTANYL CITRATE (PF) 100 MCG/2ML IJ SOLN
INTRAMUSCULAR | Status: AC
  Filled 2018-10-11: qty 2

## 2018-10-11 MED ORDER — SODIUM CHLORIDE 0.9 % IV SOLN: INTRAVENOUS | Status: DC

## 2018-10-11 MED ORDER — HEPARIN SOD (PORK) LOCK FLUSH 100 UNIT/ML IV SOLN
500.0000 [IU] | Freq: Once | INTRAVENOUS | Status: AC
  Administered 2018-10-11: 500 [IU] via INTRAVENOUS

## 2018-10-11 MED ORDER — SODIUM CHLORIDE (PF) 0.9 % IJ SOLN
INTRAMUSCULAR | Status: AC
  Filled 2018-10-11: qty 50

## 2018-10-11 MED ORDER — IOHEXOL 300 MG/ML  SOLN
75.0000 mL | Freq: Once | INTRAMUSCULAR | Status: AC | PRN
  Administered 2018-10-11: 60 mL via INTRAVENOUS

## 2018-10-11 MED ORDER — HEPARIN SOD (PORK) LOCK FLUSH 100 UNIT/ML IV SOLN
INTRAVENOUS | Status: AC
  Filled 2018-10-11: qty 5

## 2018-10-11 NOTE — H&P (Signed)
Subjective:   Patient is a 68 y.o. female presents with follow-up.  She was seen not long ago in the hospital had an EGD showing ulcers.  Has been on Protonix and been doing well.  She was found to have a lung cancer on CT scan and has been worked up as an outpatient for that.  She has had biopsy of a mass consistent with small cell lung cancer.  As part of her work-up she has gotten a PET scan ordered by Dr. Earlie Server which showed activity in the rectum and anus.  We are doing a sigmoidoscopy today to look at this area.  This is at the request of oncology so that her therapy can be adjusted.  She is currently receiving chemotherapy and I have been following her CBC.  Her next cycle is due 4/1 and her blood count is come back up to the prechemotherapy levels and it was felt this would be a good time to endoscopically evaluate the anal/rectum area and obtain biopsies.. Procedure including risks and benefits discussed in office.  Patient Active Problem List   Diagnosis Date Noted  . Hypomagnesemia 09/24/2018  . Small cell carcinoma of lower lobe of right lung ([place]) 08/28/2018  . Encounter for antineoplastic chemotherapy 08/28/2018  . Goals of care, counseling/discussion 08/28/2018  . Right lower lobe lung mass   . Acute CVA (cerebrovascular accident) ([place]) 06/21/2018  . Acute blood loss anemia 06/21/2018  . Upper GI bleed 06/19/2018  . Psoriasis 12/14/2017  . Knee pain, right 12/14/2017  . BMI 32.0-32.9,adult 12/15/2011  . Underinsured 12/15/2011  . HYPERLIPIDEMIA 03/26/2007  . Essential hypertension 03/26/2007   Past Medical History:  Diagnosis Date  . Arthritis   . Chronic pain   . Hyperlipidemia   . Hypertension   . Low blood magnesium 10/04/2018  . lung ca dx'd 06/2018  . Stroke ([place])    06/19/2018 minor    Past Surgical History:  Procedure Laterality Date  . BIOPSY  06/19/2018   Procedure: BIOPSY;  Surgeon: Laurence Spates, MD;  Location: [facility];  Service: Endoscopy;;  .  ENDARTERECTOMY Right 06/26/2018   Procedure: ENDARTERECTOMY CAROTID RIGHT;  Surgeon: Serafina Mitchell, MD;  Location: [facility] OR;  Service: Vascular;  Laterality: Right;  . ESOPHAGOGASTRODUODENOSCOPY (EGD) WITH PROPOFOL N/A 06/19/2018   Procedure: ESOPHAGOGASTRODUODENOSCOPY (EGD) WITH PROPOFOL;  Surgeon: Laurence Spates, MD;  Location: [facility];  Service: Endoscopy;  Laterality: N/A;  . IR IMAGING GUIDED PORT INSERTION  10/07/2018  . PATCH ANGIOPLASTY Right 06/26/2018   Procedure: PATCH ANGIOPLASTY USING A XENOSURE 1cm x 6cm BIOLOGIC PATCH;  Surgeon: Serafina Mitchell, MD;  Location: [place];  Service: Vascular;  Laterality: Right;  Marland Kitchen VIDEO BRONCHOSCOPY WITH ENDOBRONCHIAL ULTRASOUND N/A 08/14/2018   Procedure: VIDEO BRONCHOSCOPY WITH ENDOBRONCHIAL ULTRASOUND;  Surgeon: Collene Gobble, MD;  Location: [facility] OR;  Service: Thoracic;  Laterality: N/A;    Medications Prior to Admission  Medication Sig Dispense Refill Last Dose  . acetaminophen (TYLENOL) 650 MG CR tablet Take 650 mg by mouth every 8 (eight) hours as needed for pain.   10/10/2018 at Unknown time  . aspirin EC 325 MG EC tablet Take 1 tablet (325 mg total) by mouth daily. STOP 12/15 FOR BRONCH AND BIOPSY 12/18.Marland KitchenRESUME AFTER BIOPSY (Patient taking differently: Take 325 mg by mouth daily. ) 30 tablet 0 10/10/2018 at Unknown time  . atorvastatin (LIPITOR) 80 MG tablet Take 1 tablet (80 mg total) by mouth daily at 6 PM. 90 tablet 3 10/10/2018 at  Unknown time  . cephALEXin (KEFLEX) 500 MG capsule Take 1 capsule (500 mg total) by mouth 4 (four) times daily. 28 capsule 0 Past Week at Unknown time  . chlorthalidone (HYGROTON) 25 MG tablet Take 1 tablet (25 mg total) by mouth daily. 90 tablet 0 10/10/2018 at Unknown time  . Ferrous Gluconate-C-Folic Acid (IRON-C PO) Take 65 mg by mouth 3 (three) times a week.    Past Week at Unknown time  . lisinopril (PRINIVIL,ZESTRIL) 40 MG tablet Take 1 tablet (40 mg total) by mouth daily. 90 tablet 0 10/10/2018 at Unknown time   . magnesium oxide (MAG-OX) 400 MG tablet Take 1 tablet (400 mg total) by mouth 3 (three) times daily. 90 tablet 0 10/10/2018 at Unknown time  . oxyCODONE-acetaminophen (PERCOCET/ROXICET) 5-325 MG tablet Take 1 tablet by mouth every 8 (eight) hours as needed for severe pain. 15 tablet 0   . pantoprazole (PROTONIX) 40 MG tablet Take 1 tablet (40 mg total) by mouth 2 (two) times daily before a meal. 60 tablet 3 10/10/2018 at Unknown time  . prochlorperazine (COMPAZINE) 10 MG tablet Take 1 tablet (10 mg total) by mouth every 6 (six) hours as needed for nausea or vomiting. 30 tablet 0 10/11/2018 at Unknown time  . lidocaine-prilocaine (EMLA) cream Apply 1 application topically as needed. 30 g 0    No Known Allergies  Social History   Tobacco Use  . Smoking status: Former Smoker    Packs/day: 1.00    Years: 10.00    Pack years: 10.00    Types: Cigarettes    Last attempt to quit: 06/19/2018    Years since quitting: 0.3  . Smokeless tobacco: Never Used  . Tobacco comment: Currently using [employer]orette Gum  Substance Use Topics  . Alcohol use: Yes    Comment: wine 2 glasses a day     Family History  Problem Relation Age of Onset  . Cancer Mother        lung cancer  . Diabetes Other   . Hyperlipidemia Other   . Hypertension Other   . Cancer Other      Objective:   Patient Vitals for the past 8 hrs:  BP Temp Temp src Pulse Resp SpO2 Height Weight  10/11/18 0954 (!) 168/108 98.3 F (36.8 C) Oral 92 16 97 % 5\' 5"  (1.651 m) 90.7 kg   No intake/output data recorded. No intake/output data recorded.   See MD Preop evaluation      Assessment:   1.  Abnormal activity in the anal/rectal area on PET scan 2.  Newly diagnosed small cell carcinoma of the lung currently receiving chemo and radiation therapy 3.  Recent hospitalization and GI bleed due to gastric ulcer is on active ulcer suppression therapy  Plan:   We will proceed with sigmoidoscopy using moderate sedation and biopsy of the  rectal area if appropriate.

## 2018-10-11 NOTE — Discharge Instructions (Signed)

## 2018-10-11 NOTE — Op Note (Signed)
[facility] Patient Name: [name] Procedure Date: 10/11/2018 MRN: [id number] Attending MD: Nancy Fetter Dr., MD Date of Birth: February 17, 1951 CSN: [id number] Age: 68 Admit Type: Outpatient Procedure:                Flexible Sigmoidoscopy with Biopsy Indications:              Abnormal PET scan of the GI tract, pt with known                            small cell lung cancer with PET scan showing                            activity in the anal-rectum area Providers:                Jeneen Rinks L. Aniyla Harling Dr., MD, Cleda Daub, RN, Charolette Child, Technician, Anne Fu CRNA, CRNA Referring MD:              Medicines:                Fentanyl 50 micrograms IV, Midazolam 3 mg IV Complications:            No immediate complications. Estimated Blood Loss:     Estimated blood loss was minimal. Procedure:                Pre-Anesthesia Assessment:                           - Prior to the procedure, a History and Physical                            was performed, and patient medications and                            allergies were reviewed. The patient's tolerance of                            previous anesthesia was also reviewed. The risks                            and benefits of the procedure and the sedation                            options and risks were discussed with the patient.                            All questions were answered, and informed consent                            was obtained. Prior Anticoagulants: The patient has                            taken no previous anticoagulant or antiplatelet  agents. ASA Grade Assessment: III - A patient with                            severe systemic disease. After reviewing the risks                            and benefits, the patient was deemed in                            satisfactory condition to undergo the procedure.                           After  obtaining informed consent, the scope was                            passed under direct vision. The PCF-[id number]                            ([id number]) Olympus pediatric colonscope was                            introduced through the anus and advanced to the the                            descending colon. The flexible sigmoidoscopy was                            accomplished without difficulty. The patient                            tolerated the procedure well. The quality of the                            bowel preparation was good. The pt had prepped at                            home with Fleets x 2. Scope In: 10:37:37 AM Scope Out: 10:47:37 AM Total Procedure Duration: 0 hours 10 minutes 0 seconds  Findings:      Hemorrhoids were found on perianal exam.      Multiple sessile polyps were found in the rectum. The polyps were 1 to 3       mm in size. Biopsies were taken with a cold forceps for histology. We       only biopsied the larger polyp.      Normal mucosa was found in the recto-sigmoid colon and in the sigmoid       colon. Advanced to about 45 cm Impression:               - Hemorrhoids found on perianal exam.                           - Multiple 1 to 3 mm polyps in the rectum.  Biopsied. Very likely all hyperplastic.                           - Normal mucosa in the recto-sigmoid colon and in                            the sigmoid colon. Moderate Sedation:      Moderate (conscious) sedation was administered by the endoscopy nurse       and supervised by the endoscopist. The following parameters were       monitored: oxygen saturation, heart rate, blood pressure, respiratory       rate, EKG, adequacy of pulmonary ventilation, and response to care. Recommendation:           - The patient will be observed post-procedure,                            until all discharge criteria are met.                           - Discharge patient to home (ambulatory).                            - Patient has a contact number available for                            emergencies. The signs and symptoms of potential                            delayed complications were discussed with the                            patient. Return to normal activities tomorrow.                            Written discharge instructions were provided to the                            patient. Procedure Code(s):        --- Professional ---                           [id number], Sigmoidoscopy, flexible; with biopsy, single                            or multiple Diagnosis Code(s):        --- Professional ---                           K62.1, Rectal polyp                           R93.3, Abnormal findings on diagnostic imaging of                            other parts of digestive tract  K64.9, Unspecified hemorrhoids CPT copyright 2019 [employer]. All rights reserved. The codes documented in this report are preliminary and upon coder review may  be revised to meet current compliance requirements. Nancy Fetter Dr., MD 10/11/2018 11:03:30 AM This report has been signed electronically. Number of Addenda: 0

## 2018-10-14 ENCOUNTER — Ambulatory Visit
Admission: RE | Admit: 2018-10-14 | Discharge: 2018-10-14 | Disposition: A | Discharge status: Home / Self Care | Payer: Medicare Other | Source: Ambulatory Visit | Attending: Radiation Oncology | Admitting: Radiation Oncology

## 2018-10-14 ENCOUNTER — Other Ambulatory Visit: Payer: Self-pay

## 2018-10-14 DIAGNOSIS — Z510 Encounter for antineoplastic radiation therapy: Secondary | ICD-10-CM | POA: Diagnosis not present

## 2018-10-15 ENCOUNTER — Encounter (HOSPITAL_COMMUNITY): Payer: Self-pay | Admitting: Gastroenterology

## 2018-10-15 ENCOUNTER — Inpatient Hospital Stay (HOSPITAL_BASED_OUTPATIENT_CLINIC_OR_DEPARTMENT_OTHER): Payer: Medicare Other | Admitting: Physician Assistant

## 2018-10-15 ENCOUNTER — Inpatient Hospital Stay: Payer: Medicare Other

## 2018-10-15 ENCOUNTER — Other Ambulatory Visit: Payer: Self-pay

## 2018-10-15 ENCOUNTER — Ambulatory Visit
Admission: RE | Admit: 2018-10-15 | Discharge: 2018-10-15 | Disposition: A | Discharge status: Home / Self Care | Payer: Medicare Other | Source: Ambulatory Visit | Attending: Radiation Oncology | Admitting: Radiation Oncology

## 2018-10-15 ENCOUNTER — Other Ambulatory Visit: Payer: Self-pay | Admitting: Radiation Oncology

## 2018-10-15 VITALS — BP 123/84 | HR 94 | Temp 97.8°F | Resp 18 | Ht 65.0 in | Wt 199.3 lb

## 2018-10-15 DIAGNOSIS — Z8673 Personal history of transient ischemic attack (TIA), and cerebral infarction without residual deficits: Secondary | ICD-10-CM

## 2018-10-15 DIAGNOSIS — C3431 Malignant neoplasm of lower lobe, right bronchus or lung: Secondary | ICD-10-CM

## 2018-10-15 DIAGNOSIS — Z87891 Personal history of nicotine dependence: Secondary | ICD-10-CM

## 2018-10-15 DIAGNOSIS — R110 Nausea: Secondary | ICD-10-CM | POA: Diagnosis not present

## 2018-10-15 DIAGNOSIS — I700 Atherosclerosis of aorta: Secondary | ICD-10-CM

## 2018-10-15 DIAGNOSIS — Z510 Encounter for antineoplastic radiation therapy: Secondary | ICD-10-CM | POA: Diagnosis not present

## 2018-10-15 DIAGNOSIS — Z79899 Other long term (current) drug therapy: Secondary | ICD-10-CM

## 2018-10-15 DIAGNOSIS — R197 Diarrhea, unspecified: Secondary | ICD-10-CM | POA: Diagnosis not present

## 2018-10-15 DIAGNOSIS — E785 Hyperlipidemia, unspecified: Secondary | ICD-10-CM

## 2018-10-15 DIAGNOSIS — I10 Essential (primary) hypertension: Secondary | ICD-10-CM

## 2018-10-15 DIAGNOSIS — Z5111 Encounter for antineoplastic chemotherapy: Secondary | ICD-10-CM

## 2018-10-15 DIAGNOSIS — M1990 Unspecified osteoarthritis, unspecified site: Secondary | ICD-10-CM

## 2018-10-15 LAB — CMP (CANCER CENTER ONLY)
ALT: 9 U/L (ref 0–44)
AST: 14 U/L — ABNORMAL LOW (ref 15–41)
Albumin: 3.5 g/dL (ref 3.5–5.0)
Alkaline Phosphatase: 90 U/L (ref 38–126)
Anion gap: 9 (ref 5–15)
BUN: 16 mg/dL (ref 8–23)
CO2: 26 mmol/L (ref 22–32)
Calcium: 9.1 mg/dL (ref 8.9–10.3)
Chloride: 102 mmol/L (ref 98–111)
Creatinine: 1.32 mg/dL — ABNORMAL HIGH (ref 0.44–1.00)
GFR, Est AFR Am: 49 mL/min — ABNORMAL LOW (ref >60)
GFR, Estimated: 42 mL/min — ABNORMAL LOW (ref >60)
GLUCOSE: 101 mg/dL — AB (ref 70–99)
Potassium: 4.2 mmol/L (ref 3.5–5.1)
Sodium: 137 mmol/L (ref 135–145)
Total Bilirubin: 0.3 mg/dL (ref 0.3–1.2)
Total Protein: 6.5 g/dL (ref 6.5–8.1)

## 2018-10-15 LAB — CBC WITH DIFFERENTIAL (CANCER CENTER ONLY)
Abs Immature Granulocytes: 0.05 10*3/uL (ref 0.00–0.07)
Basophils Absolute: 0 10*3/uL (ref 0.0–0.1)
Basophils Relative: 0 %
Eosinophils Absolute: 0 10*3/uL (ref 0.0–0.5)
Eosinophils Relative: 0 %
HEMATOCRIT: 31 % — AB (ref 36.0–46.0)
Hemoglobin: 9.6 g/dL — ABNORMAL LOW (ref 12.0–15.0)
Immature Granulocytes: 1 %
Lymphocytes Relative: 8 %
Lymphs Abs: 0.7 10*3/uL (ref 0.7–4.0)
MCH: 27 pg (ref 26.0–34.0)
MCHC: 31 g/dL (ref 30.0–36.0)
MCV: 87.3 fL (ref 80.0–100.0)
MONOS PCT: 8 %
Monocytes Absolute: 0.6 10*3/uL (ref 0.1–1.0)
Neutro Abs: 7.1 10*3/uL (ref 1.7–7.7)
Neutrophils Relative %: 83 %
Platelet Count: 289 10*3/uL (ref 150–400)
RBC: 3.55 MIL/uL — ABNORMAL LOW (ref 3.87–5.11)
RDW: 19.8 % — ABNORMAL HIGH (ref 11.5–15.5)
WBC Count: 8.4 10*3/uL (ref 4.0–10.5)
nRBC: 0 % (ref 0.0–0.2)

## 2018-10-15 LAB — MAGNESIUM: Magnesium: 1.5 mg/dL — ABNORMAL LOW (ref 1.7–2.4)

## 2018-10-15 MED ORDER — SUCRALFATE 1 GM/10ML PO SUSP: 1.0000 g | Freq: Three times a day (TID) | ORAL | 0 refills | Status: DC

## 2018-10-15 MED ORDER — PROCHLORPERAZINE MALEATE 10 MG PO TABS: 10.0000 mg | ORAL_TABLET | Freq: Four times a day (QID) | ORAL | 0 refills | Status: DC | PRN

## 2018-10-15 NOTE — Progress Notes (Signed)
[facility] OFFICE PROGRESS NOTE  Rutherford Guys, MD [address] [name] [id number]  DIAGNOSIS: Limited stage (T2b, N2,M0) small cell lung cancer diagnosed in January 2020. She presented with right lower lobe lung mass in addition to right hilar and mediastinal lymphadenopathy.  PRIOR THERAPY: None   CURRENT THERAPY: Systemic chemotherapy concurrent with radiation. Chemotherapy withcisplatin 80 mg/M2 on day 1 and etoposide 100 mg/M2 on days 1, 2 and 3 every 3 weeks.First dose September 03, 2018. Status post 2 cycle.  INTERVAL HISTORY: [name] 68 y.o. female returns to the clinic for a follow-up visit.  She is feeling well today without any concerning complaints.  Recently, she had a port placed and tolerated that procedure well except for some pain following the procedure. She has also been seen by Dr. Oletta Lamas from gastroenterology for the abnormal hypermetabolic region found on the recent PET scan.  On October 11, 2018, Dr. Oletta Lamas performed a flexible sigmoidoscopy which did not show any concerning lesions for malignancy.  He found multiple small polyps.  The pathology report was consistent with polyps.   Otherwise she tolerated her first two treatments fairly well except for diarrhea approximately 4 consecutive days days following treatment.  She reported several loose bowel movements per day which eventually resolved with the addition of Imodium.  She denies any associated blood, abdominal pain, or fevers.  Today she denies any fever, chills, night sweats, or weight loss.  She denies any chest pain, shortness of breath, cough, or hemoptysis.  She denies any nausea, vomiting, diarrhea, or constipation.  She denies any headache or visual changes.  She recently had a restaging CT scan performed and is here for evaluation and to discuss the scan results before starting cycle #3.  MEDICAL HISTORY: Past Medical History:  Diagnosis Date  . Arthritis   . Chronic  pain   . Hyperlipidemia   . Hypertension   . Low blood magnesium 10/04/2018  . lung ca dx'd 06/2018  . Stroke ([place])    06/19/2018 minor    ALLERGIES:  has No Known Allergies.  MEDICATIONS:  Current Outpatient Medications  Medication Sig Dispense Refill  . acetaminophen (TYLENOL) 650 MG CR tablet Take 650 mg by mouth every 8 (eight) hours as needed for pain.    Marland Kitchen aspirin EC 325 MG EC tablet Take 1 tablet (325 mg total) by mouth daily. STOP 12/15 FOR BRONCH AND BIOPSY 12/18.Marland KitchenRESUME AFTER BIOPSY (Patient taking differently: Take 325 mg by mouth daily. ) 30 tablet 0  . atorvastatin (LIPITOR) 80 MG tablet Take 1 tablet (80 mg total) by mouth daily at 6 PM. 90 tablet 3  . cephALEXin (KEFLEX) 500 MG capsule Take 1 capsule (500 mg total) by mouth 4 (four) times daily. 28 capsule 0  . chlorthalidone (HYGROTON) 25 MG tablet Take 1 tablet (25 mg total) by mouth daily. 90 tablet 0  . Ferrous Gluconate-C-Folic Acid (IRON-C PO) Take 65 mg by mouth 3 (three) times a week.     . lidocaine-prilocaine (EMLA) cream Apply 1 application topically as needed. 30 g 0  . lisinopril (PRINIVIL,ZESTRIL) 40 MG tablet Take 1 tablet (40 mg total) by mouth daily. 90 tablet 0  . magnesium oxide (MAG-OX) 400 MG tablet Take 1 tablet (400 mg total) by mouth 3 (three) times daily. 90 tablet 0  . oxyCODONE-acetaminophen (PERCOCET/ROXICET) 5-325 MG tablet Take 1 tablet by mouth every 8 (eight) hours as needed for severe pain. 15 tablet 0  . pantoprazole (PROTONIX)  40 MG tablet Take 1 tablet (40 mg total) by mouth 2 (two) times daily before a meal. 60 tablet 3  . prochlorperazine (COMPAZINE) 10 MG tablet Take 1 tablet (10 mg total) by mouth every 6 (six) hours as needed for nausea or vomiting. 30 tablet 0   No current facility-administered medications for this visit.     SURGICAL HISTORY:  Past Surgical History:  Procedure Laterality Date  . BIOPSY  06/19/2018   Procedure: BIOPSY;  Surgeon: Laurence Spates, MD;  Location: [facility]  ENDOSCOPY;  Service: Endoscopy;;  . BIOPSY  10/11/2018   Procedure: BIOPSY;  Surgeon: Laurence Spates, MD;  Location: [facility] ENDOSCOPY;  Service: Endoscopy;;  . ENDARTERECTOMY Right 06/26/2018   Procedure: ENDARTERECTOMY CAROTID RIGHT;  Surgeon: Serafina Mitchell, MD;  Location: [facility] OR;  Service: Vascular;  Laterality: Right;  . ESOPHAGOGASTRODUODENOSCOPY (EGD) WITH PROPOFOL N/A 06/19/2018   Procedure: ESOPHAGOGASTRODUODENOSCOPY (EGD) WITH PROPOFOL;  Surgeon: Laurence Spates, MD;  Location: [facility] ENDOSCOPY;  Service: Endoscopy;  Laterality: N/A;  . FLEXIBLE SIGMOIDOSCOPY N/A 10/11/2018   Procedure: FLEXIBLE SIGMOIDOSCOPY;  Surgeon: Laurence Spates, MD;  Location: [facility] ENDOSCOPY;  Service: Endoscopy;  Laterality: N/A;  . IR IMAGING GUIDED PORT INSERTION  10/07/2018  . PATCH ANGIOPLASTY Right 06/26/2018   Procedure: PATCH ANGIOPLASTY USING A XENOSURE 1cm x 6cm BIOLOGIC PATCH;  Surgeon: Serafina Mitchell, MD;  Location: [place];  Service: Vascular;  Laterality: Right;  Marland Kitchen VIDEO BRONCHOSCOPY WITH ENDOBRONCHIAL ULTRASOUND N/A 08/14/2018   Procedure: VIDEO BRONCHOSCOPY WITH ENDOBRONCHIAL ULTRASOUND;  Surgeon: Collene Gobble, MD;  Location: [facility] OR;  Service: Thoracic;  Laterality: N/A;    REVIEW OF SYSTEMS:   Review of Systems  Constitutional: Negative for appetite change, chills, fatigue, fever and unexpected weight change.  HENT:   Negative for mouth sores, nosebleeds, sore throat and trouble swallowing.   Eyes: Negative for eye problems and icterus.  Respiratory: Negative for cough, hemoptysis, shortness of breath and wheezing.   Cardiovascular: Negative for chest pain and leg swelling.  Gastrointestinal: Negative for abdominal pain, constipation, diarrhea, nausea and vomiting.  Genitourinary: Negative for bladder incontinence, difficulty urinating, dysuria, frequency and hematuria.   Musculoskeletal: Negative for back pain, gait problem, neck pain and neck stiffness.  Skin: Negative for itching and rash.   Neurological: Negative for dizziness, extremity weakness, gait problem, headaches, light-headedness and seizures.  Hematological: Negative for adenopathy. Does not bruise/bleed easily.  Psychiatric/Behavioral: Negative for confusion, depression and sleep disturbance. The patient is not nervous/anxious.     PHYSICAL EXAMINATION:  Blood pressure 123/84, pulse 94, temperature 97.8 F (36.6 C), temperature source Oral, resp. rate 18, height 5\' 5"  (1.651 m), weight 199 lb 4.8 oz (90.4 kg), SpO2 98 %.  ECOG PERFORMANCE STATUS: 1 - Symptomatic but completely ambulatory  Physical Exam  Constitutional: Oriented to person, place, and time and well-developed, well-nourished, and in no distress. No distress.  HENT:  Head: Normocephalic and atraumatic.  Mouth/Throat: Oropharynx is clear and moist. No oropharyngeal exudate.  Eyes: Conjunctivae are normal. Right eye exhibits no discharge. Left eye exhibits no discharge. No scleral icterus.  Neck: Normal range of motion. Neck supple.  Cardiovascular: Normal rate, regular rhythm, normal heart sounds and intact distal pulses.   Pulmonary/Chest: Effort normal and breath sounds normal. No respiratory distress. No wheezes. No rales.  Abdominal: Soft. Bowel sounds are normal. Exhibits no distension and no mass. There is no tenderness.  Musculoskeletal: Normal range of motion. Exhibits no edema.  Lymphadenopathy:    No cervical adenopathy.  Neurological:  Alert and oriented to person, place, and time. Exhibits normal muscle tone. Gait normal. Coordination normal.  Skin: Skin is warm and dry. No rash noted. Not diaphoretic. No erythema. No pallor.  Psychiatric: Mood, memory and judgment normal.  Vitals reviewed.  LABORATORY DATA: Lab Results  Component Value Date   WBC 8.4 10/15/2018   HGB 9.6 (L) 10/15/2018   HCT 31.0 (L) 10/15/2018   MCV 87.3 10/15/2018   PLT 289 10/15/2018      Chemistry      Component Value Date/Time   NA 137 10/15/2018 1143    NA 142 08/02/2018 1042   K 4.2 10/15/2018 1143   CL 102 10/15/2018 1143   CO2 26 10/15/2018 1143   BUN 16 10/15/2018 1143   BUN 9 08/02/2018 1042   CREATININE 1.32 (H) 10/15/2018 1143      Component Value Date/Time   CALCIUM 9.1 10/15/2018 1143   ALKPHOS 90 10/15/2018 1143   AST 14 (L) 10/15/2018 1143   ALT 9 10/15/2018 1143   BILITOT 0.3 10/15/2018 1143       RADIOGRAPHIC STUDIES:  Ct Chest W Contrast  Result Date: 10/11/2018 CLINICAL DATA:  68 year old female with history of lung cancer undergoing ongoing chemotherapy and radiation therapy. 15 pounds of weight loss. EXAM: CT CHEST WITH CONTRAST TECHNIQUE: Multidetector CT imaging of the chest was performed during intravenous contrast administration. CONTRAST:  2mL OMNIPAQUE IOHEXOL 300 MG/ML  SOLN COMPARISON:  Chest CT 08/09/2018.  PET-CT 08/30/2018. FINDINGS: Cardiovascular: Heart size is normal. There is no significant pericardial fluid, thickening or pericardial calcification. There is aortic atherosclerosis, as well as atherosclerosis of the great vessels of the mediastinum and the coronary arteries, including calcified atherosclerotic plaque in the left main, left anterior descending, left circumflex and right coronary arteries. Right internal jugular single-lumen porta cath with tip terminating in the right atrium. Mediastinum/Nodes: Multiple enlarged mediastinal and right hilar lymph nodes are again noted. These are generally decreased in size compared to the prior study, largest of which measures up to 3.9 cm in short axis (axial image 58 of series 2), decreased from 4.1 cm on prior PET-CT 08/30/2018. Prevascular lymph node anterior to the superior vena cava currently measures 13 mm in short axis (previously 2.1 cm). Subcarinal lymph node measuring 1.8 cm in short axis (axial image 72 of series 2) previously 2.3 cm. Largest right hilar lymph node currently measures 1.5 cm (axial image 87 of series 2). Esophagus is unremarkable in  appearance. No axillary lymphadenopathy. Lungs/Pleura: Previously noted right lower lobe mass (axial image 98 of series 7) has decreased in size, currently measuring 3.1 x 3.3 cm. There are some adjacent postobstructive changes, predominantly atelectasis, in the right lower lobe. No other definite suspicious appearing pulmonary nodules or masses. No acute consolidative airspace disease. No pleural effusions. Upper Abdomen: Small left adrenal nodules measuring up to 1.3 cm in the lateral limb of the left adrenal gland, similar to prior studies (previously not hypermetabolic on PET-CT), nonspecific. Aortic atherosclerosis. Musculoskeletal: There are no aggressive appearing lytic or blastic lesions noted in the visualized portions of the skeleton. IMPRESSION: 1. Today's study demonstrates a positive response to therapy, with decreased size of right lower lobe mass as well as right hilar and mediastinal lymphadenopathy, as detailed above. 2. Stable small left adrenal nodules, previously not hypermetabolic on PET-CT. Continued attention on follow-up studies is recommended. 3. Aortic atherosclerosis, in addition to left main and 3 vessel coronary artery disease. Please note that although the presence of coronary  artery calcium documents the presence of coronary artery disease, the severity of this disease and any potential stenosis cannot be assessed on this non-gated CT examination. Assessment for potential risk factor modification, dietary therapy or pharmacologic therapy may be warranted, if clinically indicated. Aortic Atherosclerosis (ICD10-I70.0). Electronically Signed   By: Vinnie Langton M.D.   On: 10/11/2018 11:03   Ir Imaging Guided Port Insertion  Result Date: 10/07/2018 INDICATION: 68 year old with small cell lung cancer and poor venous access. EXAM: FLUOROSCOPIC AND ULTRASOUND GUIDED PLACEMENT OF A SUBCUTANEOUS PORT COMPARISON:  None. MEDICATIONS: Ancef 2 g; The antibiotic was administered within an  appropriate time interval prior to skin puncture. ANESTHESIA/SEDATION: Versed 4.0 mg IV; Fentanyl 100 mcg IV; Moderate Sedation Time:  40 minutes The patient was continuously monitored during the procedure by the interventional radiology nurse under my direct supervision. FLUOROSCOPY TIME:  24 seconds, 5 mGy COMPLICATIONS: None immediate. PROCEDURE: The procedure, risks, benefits, and alternatives were explained to the patient. Questions regarding the procedure were encouraged and answered. The patient understands and consents to the procedure. Patient was placed supine on the interventional table. Ultrasound confirmed a patent right internal jugular vein. Ultrasound image was saved for documentation. The right chest and neck were cleaned with a skin antiseptic and a sterile drape was placed. Maximal barrier sterile technique was utilized including caps, mask, sterile gowns, sterile gloves, sterile drape, hand hygiene and skin antiseptic. The right neck was anesthetized with 1% lidocaine. Small incision was made in the right neck with a blade. Micropuncture set was placed in the right internal jugular vein with ultrasound guidance. The micropuncture wire was used for measurement purposes. The right chest was anesthetized with 1% lidocaine with epinephrine. #15 blade was used to make an incision and a subcutaneous port pocket was formed. [place] was assembled. Subcutaneous tunnel was formed with a stiff tunneling device. The port catheter was brought through the subcutaneous tunnel. The port was placed in the subcutaneous pocket and sutured in place. The micropuncture set was exchanged for a peel-away sheath. The catheter was placed through the peel-away sheath and the tip was positioned at the SVC/right atrium junction. Catheter placement was confirmed with fluoroscopy. The port was accessed and flushed with heparinized saline. The port pocket was closed using two layers of absorbable sutures and  Dermabond. The vein skin site was closed using a single layer of absorbable suture and Dermabond. Sterile dressings were applied. Patient tolerated the procedure well without an immediate complication. Ultrasound and fluoroscopic images were taken and saved for this procedure. IMPRESSION: Placement of a subcutaneous port device. Catheter tip at the SVC/right atrium junction. Electronically Signed   By: Markus Daft M.D.   On: 10/07/2018 16:13     ASSESSMENT/PLAN:  This is a very pleasant 55 year oldCaucasianfemale recently diagnosed with limited stage (T2b, N2,M0) small cell lung cancer. She was diagnosedin January 2020. She presented with a right lower lobe lung mass in addition to right hilar and mediastinal lymphadenopathy. The patient is currently undergoing treatment with cisplatin 80 mg on day 1 and etoposide 100mg /m on days 1, 2, and 3every3 weeks. She is status post 2 cycle. She tolerated treatment fairly well except for some mild nausea, diarrhea, and neutropenia following treatment. First dose was given on September 03, 2018. She is also undergoing radiation treatment under the care of Dr. Sondra Come.   The patient recently had a restaging CT scan performed.  Dr. Julien Nordmann personally and independently reviewed the scan results and discussed  the results with the patient today.  The scan showed a positive response to treatment with a interval decrease in the mass.  We recommend she proceed with cycle #3 today as scheduled.   I will see her back in 3 weeks for evaluation before starting cycle #4.  She continues to have hypomagnesemia. Her magnesium is 1.5 today. She will continue taking 400 mg of magnesium TID for the hypomagnesemia. We will add an additional 2g of magnesium sulfate to her fluid tomorrow during her treatment.  She will continue taking Compazine for nausea. A refill was sent to her pharmacy. She also was advised to take imodium should she develop diarrhea following her treatment.    The patient was advised to call immediately if she has any concerning symptoms in the interval. The patient voices understanding of current disease status and treatment options and is in agreement with the current care plan. All questions were answered. The patient knows to call the clinic with any problems, questions or concerns. We can certainly see the patient much sooner if necessary  No orders of the defined types were placed in this encounter.    Cassandra L Heilingoetter, PA-C 10/15/18  ADDENDUM: Hematology/Oncology Attending: I had a face-to-face encounter with the patient today.  I recommended her care plan.  This is a very pleasant 68 years old white female with limited stage small cell lung cancer and currently undergoing systemic chemotherapy with cisplatin and etoposide status post 2 cycles.  The patient tolerated the second cycle of her treatment well with no concerning complaints except for mild nausea as well as few episodes of diarrhea.  She was seen recently by her gastroenterologist and sigmoidoscopy showed no concerning findings. The patient had repeat CT scan of the chest performed recently.  I personally and independently reviewed the scans and discussed the results and showed the images to the patient today. Her scan showed improvement of her disease. I recommended for the patient to continue her current treatment with cisplatin and etoposide as scheduled. For the hypomagnesemia we will add additional magnesium sulfate to her fluid with the treatment tomorrow and the patient will continue on magnesium oxide 400 mg p.o. 3 times daily on outpatient basis. She will come back for follow-up visit in 3 weeks for evaluation before starting cycle #4. She was advised to call immediately if she has any concerning symptoms in the interval.  Disclaimer: This note was dictated with voice recognition software. Similar sounding words can inadvertently be transcribed and may be missed  upon review. Eilleen Kempf, MD 10/15/18

## 2018-10-16 ENCOUNTER — Other Ambulatory Visit: Payer: Self-pay

## 2018-10-16 ENCOUNTER — Inpatient Hospital Stay: Payer: Medicare Other | Attending: Internal Medicine

## 2018-10-16 ENCOUNTER — Ambulatory Visit
Admission: RE | Admit: 2018-10-16 | Discharge: 2018-10-16 | Disposition: A | Discharge status: Home / Self Care | Payer: Medicare Other | Source: Ambulatory Visit | Attending: Radiation Oncology | Admitting: Radiation Oncology

## 2018-10-16 ENCOUNTER — Other Ambulatory Visit: Payer: Self-pay | Admitting: Internal Medicine

## 2018-10-16 VITALS — BP 113/83 | HR 95 | Temp 97.7°F | Resp 18

## 2018-10-16 DIAGNOSIS — E785 Hyperlipidemia, unspecified: Secondary | ICD-10-CM | POA: Insufficient documentation

## 2018-10-16 DIAGNOSIS — G8929 Other chronic pain: Secondary | ICD-10-CM | POA: Insufficient documentation

## 2018-10-16 DIAGNOSIS — C3431 Malignant neoplasm of lower lobe, right bronchus or lung: Secondary | ICD-10-CM | POA: Insufficient documentation

## 2018-10-16 DIAGNOSIS — Z8673 Personal history of transient ischemic attack (TIA), and cerebral infarction without residual deficits: Secondary | ICD-10-CM | POA: Insufficient documentation

## 2018-10-16 DIAGNOSIS — Z87891 Personal history of nicotine dependence: Secondary | ICD-10-CM | POA: Insufficient documentation

## 2018-10-16 DIAGNOSIS — R630 Anorexia: Secondary | ICD-10-CM | POA: Insufficient documentation

## 2018-10-16 DIAGNOSIS — Z7689 Persons encountering health services in other specified circumstances: Secondary | ICD-10-CM | POA: Diagnosis not present

## 2018-10-16 DIAGNOSIS — R1310 Dysphagia, unspecified: Secondary | ICD-10-CM | POA: Insufficient documentation

## 2018-10-16 DIAGNOSIS — Z5111 Encounter for antineoplastic chemotherapy: Secondary | ICD-10-CM | POA: Insufficient documentation

## 2018-10-16 DIAGNOSIS — M545 Low back pain: Secondary | ICD-10-CM | POA: Insufficient documentation

## 2018-10-16 DIAGNOSIS — I10 Essential (primary) hypertension: Secondary | ICD-10-CM | POA: Diagnosis not present

## 2018-10-16 DIAGNOSIS — R197 Diarrhea, unspecified: Secondary | ICD-10-CM | POA: Diagnosis not present

## 2018-10-16 DIAGNOSIS — R634 Abnormal weight loss: Secondary | ICD-10-CM | POA: Insufficient documentation

## 2018-10-16 DIAGNOSIS — R5383 Other fatigue: Secondary | ICD-10-CM | POA: Diagnosis not present

## 2018-10-16 DIAGNOSIS — M1990 Unspecified osteoarthritis, unspecified site: Secondary | ICD-10-CM | POA: Diagnosis not present

## 2018-10-16 DIAGNOSIS — Z510 Encounter for antineoplastic radiation therapy: Secondary | ICD-10-CM | POA: Insufficient documentation

## 2018-10-16 DIAGNOSIS — Z79899 Other long term (current) drug therapy: Secondary | ICD-10-CM | POA: Diagnosis not present

## 2018-10-16 DIAGNOSIS — E860 Dehydration: Secondary | ICD-10-CM | POA: Diagnosis not present

## 2018-10-16 MED ORDER — PALONOSETRON HCL INJECTION 0.25 MG/5ML
INTRAVENOUS | Status: AC
  Filled 2018-10-16: qty 5

## 2018-10-16 MED ORDER — SODIUM CHLORIDE 0.9 % IV SOLN
95.0000 mg/m2 | Freq: Once | INTRAVENOUS | Status: AC
  Administered 2018-10-16: 12:00:00 200 mg via INTRAVENOUS
  Filled 2018-10-16: qty 10

## 2018-10-16 MED ORDER — SODIUM CHLORIDE 0.9 % IV SOLN
80.0000 mg/m2 | Freq: Once | INTRAVENOUS | Status: AC
  Administered 2018-10-16: 165 mg via INTRAVENOUS
  Filled 2018-10-16: qty 165

## 2018-10-16 MED ORDER — PALONOSETRON HCL INJECTION 0.25 MG/5ML
0.2500 mg | Freq: Once | INTRAVENOUS | Status: AC
  Administered 2018-10-16: 0.25 mg via INTRAVENOUS

## 2018-10-16 MED ORDER — SODIUM CHLORIDE 0.9 % IV SOLN
Freq: Once | INTRAVENOUS | Status: AC
  Administered 2018-10-16: 09:00:00 via INTRAVENOUS
  Filled 2018-10-16: qty 250

## 2018-10-16 MED ORDER — SODIUM CHLORIDE 0.9% FLUSH
10.0000 mL | INTRAVENOUS | Status: DC | PRN
  Administered 2018-10-16: 10 mL
  Filled 2018-10-16: qty 10

## 2018-10-16 MED ORDER — HEPARIN SOD (PORK) LOCK FLUSH 100 UNIT/ML IV SOLN
500.0000 [IU] | Freq: Once | INTRAVENOUS | Status: AC | PRN
  Administered 2018-10-16: 17:00:00 500 [IU]
  Filled 2018-10-16: qty 5

## 2018-10-16 MED ORDER — POTASSIUM CHLORIDE 2 MEQ/ML IV SOLN
Freq: Once | INTRAVENOUS | Status: AC
  Administered 2018-10-16: 09:00:00 via INTRAVENOUS
  Filled 2018-10-16: qty 1000

## 2018-10-16 MED ORDER — SODIUM CHLORIDE 0.9 % IV SOLN
Freq: Once | INTRAVENOUS | Status: AC
  Administered 2018-10-16: 11:00:00 via INTRAVENOUS
  Filled 2018-10-16: qty 5

## 2018-10-16 NOTE — Patient Instructions (Signed)
[facility] Discharge Instructions for Patients Receiving Chemotherapy  Today you received the following chemotherapy agents Cisplatin (PLATINOL) & Etoposide (VEPESID).  To help prevent nausea and vomiting after your treatment, we encourage you to take your nausea medication as prescribed. DO NOT TAKE ZOFRAN FOR THE NEXT 3 DAYS, TAKE COMPAZINE.   If you develop nausea and vomiting that is not controlled by your nausea medication, call the clinic.   BELOW ARE SYMPTOMS THAT SHOULD BE REPORTED IMMEDIATELY:  *FEVER GREATER THAN 100.5 F  *CHILLS WITH OR WITHOUT FEVER  NAUSEA AND VOMITING THAT IS NOT CONTROLLED WITH YOUR NAUSEA MEDICATION  *UNUSUAL SHORTNESS OF BREATH  *UNUSUAL BRUISING OR BLEEDING  TENDERNESS IN MOUTH AND THROAT WITH OR WITHOUT PRESENCE OF ULCERS  *URINARY PROBLEMS  *BOWEL PROBLEMS  UNUSUAL RASH Items with * indicate a potential emergency and should be followed up as soon as possible.  Feel free to call the clinic should you have any questions or concerns. The clinic phone number is [phone].  Please show the [facility] at check-in to the Emergency Department and triage nurse.  Cisplatin injection What is this medicine? CISPLATIN (SIS pla tin) is a chemotherapy drug. It targets fast dividing cells, like cancer cells, and causes these cells to die. This medicine is used to treat many types of cancer like bladder, ovarian, and testicular cancers. This medicine may be used for other purposes; ask your health care provider or pharmacist if you have questions. COMMON BRAND NAME(S): Platinol, Platinol -AQ What should I tell my health care provider before I take this medicine? They need to know if you have any of these conditions: -blood disorders -hearing problems -kidney disease -recent or ongoing radiation therapy -an unusual or allergic reaction to cisplatin, carboplatin, other chemotherapy, other medicines, foods, dyes, or  preservatives -pregnant or trying to get pregnant -breast-feeding How should I use this medicine? This drug is given as an infusion into a vein. It is administered in a hospital or clinic by a specially trained health care professional. Talk to your pediatrician regarding the use of this medicine in children. Special care may be needed. Overdosage: If you think you have taken too much of this medicine contact a poison control center or emergency room at once. NOTE: This medicine is only for you. Do not share this medicine with others. What if I miss a dose? It is important not to miss a dose. Call your doctor or health care professional if you are unable to keep an appointment. What may interact with this medicine? -dofetilide -foscarnet -medicines for seizures -medicines to increase blood counts like filgrastim, pegfilgrastim, sargramostim -probenecid -pyridoxine used with altretamine -rituximab -some antibiotics like amikacin, gentamicin, neomycin, polymyxin B, streptomycin, tobramycin -sulfinpyrazone -vaccines -zalcitabine Talk to your doctor or health care professional before taking any of these medicines: -acetaminophen -aspirin -ibuprofen -ketoprofen -naproxen This list may not describe all possible interactions. Give your health care provider a list of all the medicines, herbs, non-prescription drugs, or dietary supplements you use. Also tell them if you smoke, drink alcohol, or use illegal drugs. Some items may interact with your medicine. What should I watch for while using this medicine? Your condition will be monitored carefully while you are receiving this medicine. You will need important blood work done while you are taking this medicine. This drug may make you feel generally unwell. This is not uncommon, as chemotherapy can affect healthy cells as well as cancer cells. Report any side effects. Continue your  course of treatment even though you feel ill unless your doctor  tells you to stop. In some cases, you may be given additional medicines to help with side effects. Follow all directions for their use. Call your doctor or health care professional for advice if you get a fever, chills or sore throat, or other symptoms of a cold or flu. Do not treat yourself. This drug decreases your body's ability to fight infections. Try to avoid being around people who are sick. This medicine may increase your risk to bruise or bleed. Call your doctor or health care professional if you notice any unusual bleeding. Be careful brushing and flossing your teeth or using a toothpick because you may get an infection or bleed more easily. If you have any dental work done, tell your dentist you are receiving this medicine. Avoid taking products that contain aspirin, acetaminophen, ibuprofen, naproxen, or ketoprofen unless instructed by your doctor. These medicines may hide a fever. Do not become pregnant while taking this medicine. Women should inform their doctor if they wish to become pregnant or think they might be pregnant. There is a potential for serious side effects to an unborn child. Talk to your health care professional or pharmacist for more information. Do not breast-feed an infant while taking this medicine. Drink fluids as directed while you are taking this medicine. This will help protect your kidneys. Call your doctor or health care professional if you get diarrhea. Do not treat yourself. What side effects may I notice from receiving this medicine? Side effects that you should report to your doctor or health care professional as soon as possible: -allergic reactions like skin rash, itching or hives, swelling of the face, lips, or tongue -signs of infection - fever or chills, cough, sore throat, pain or difficulty passing urine -signs of decreased platelets or bleeding - bruising, pinpoint red spots on the skin, black, tarry stools, nosebleeds -signs of decreased red blood  cells - unusually weak or tired, fainting spells, lightheadedness -breathing problems -changes in hearing -gout pain -low blood counts - This drug may decrease the number of white blood cells, red blood cells and platelets. You may be at increased risk for infections and bleeding. -nausea and vomiting -pain, swelling, redness or irritation at the injection site -pain, tingling, numbness in the hands or feet -problems with balance, movement -trouble passing urine or change in the amount of urine Side effects that usually do not require medical attention (report to your doctor or health care professional if they continue or are bothersome): -changes in vision -loss of appetite -metallic taste in the mouth or changes in taste This list may not describe all possible side effects. Call your doctor for medical advice about side effects. You may report side effects to FDA at 1-800-FDA-1088. Where should I keep my medicine? This drug is given in a hospital or clinic and will not be stored at home. NOTE: This sheet is a summary. It may not cover all possible information. If you have questions about this medicine, talk to your doctor, pharmacist, or health care provider.  2019 Elsevier/Gold Standard (2007-10-08 14:40:54)  Etoposide, VP-16 capsules What is this medicine? ETOPOSIDE, VP-16 (e toe POE side) is a chemotherapy drug. It is used to treat small cell lung cancer and other cancers. This medicine may be used for other purposes; ask your health care provider or pharmacist if you have questions. COMMON BRAND NAME(S): VePesid What should I tell my health care provider before I take this  medicine? They need to know if you have any of these conditions: -infection -kidney disease -liver disease -low blood counts, like low white cell, platelet, or red cell counts -an unusual or allergic reaction to etoposide, other medicines, foods, dyes, or preservatives -pregnant or trying to get  pregnant -breast-feeding How should I use this medicine? Take this medicine by mouth with a glass of water. Follow the directions on the prescription label. Do not open, crush, or chew the capsules. It is advisable to wear gloves when handling this medicine. Take your medicine at regular intervals. Do not take it more often than directed. Do not stop taking except on your doctor's advice. Talk to your pediatrician regarding the use of this medicine in children. Special care may be needed. Overdosage: If you think you have taken too much of this medicine contact a poison control center or emergency room at once. NOTE: This medicine is only for you. Do not share this medicine with others. What if I miss a dose? If you miss a dose, take it as soon as you can. If it is almost time for your next dose, take only that dose. Do not take double or extra doses. What may interact with this medicine? -aspirin -certain medications for seizures like carbamazepine, phenobarbital, phenytoin, valproic acid -cyclosporine -levamisole -valproic acid -warfarin This list may not describe all possible interactions. Give your health care provider a list of all the medicines, herbs, non-prescription drugs, or dietary supplements you use. Also tell them if you smoke, drink alcohol, or use illegal drugs. Some items may interact with your medicine. What should I watch for while using this medicine? Visit your doctor for checks on your progress. This drug may make you feel generally unwell. This is not uncommon, as chemotherapy can affect healthy cells as well as cancer cells. Report any side effects. Continue your course of treatment even though you feel ill unless your doctor tells you to stop. In some cases, you may be given additional medicines to help with side effects. Follow all directions for their use. Call your doctor or health care professional for advice if you get a fever, chills or sore throat, or other  symptoms of a cold or flu. Do not treat yourself. This drug decreases your body's ability to fight infections. Try to avoid being around people who are sick. This medicine may increase your risk to bruise or bleed. Call your doctor or health care professional if you notice any unusual bleeding. Talk to your doctor about your risk of cancer. You may be more at risk for certain types of cancers if you take this medicine. Do not become pregnant while taking this medicine or for at least 6 months after stopping it. Women should inform their doctor if they wish to become pregnant or think they might be pregnant. Women of child-bearing potential will need to have a negative pregnancy test before starting this medicine. There is a potential for serious side effects to an unborn child. Talk to your health care professional or pharmacist for more information. Do not breast-feed an infant while taking this medicine. Men must use a latex condom during sexual contact with a woman while taking this medicine and for at least 4 months after stopping it. A latex condom is needed even if you have had a vasectomy. Contact your doctor right away if your partner becomes pregnant. Do not donate sperm while taking this medicine and for 4 months after you stop taking this medicine. Men should  inform their doctors if they wish to father a child. This medicine may lower sperm counts. What side effects may I notice from receiving this medicine? Side effects that you should report to your doctor or health care professional as soon as possible: -allergic reactions like skin rash, itching or hives, swelling of the face, lips, or tongue -low blood counts - this medicine may decrease the number of white blood cells, red blood cells and platelets. You may be at increased risk for infections and bleeding. -signs of infection - fever or chills, cough, sore throat, pain or difficulty passing urine -signs of decreased platelets or bleeding -  bruising, pinpoint red spots on the skin, black, tarry stools, blood in the urine -signs of decreased red blood cells - unusually weak or tired, fainting spells, lightheadedness -breathing problems -changes in vision -mouth or throat sores or ulcers -pain, tingling, numbness in the hands or feet -redness, blistering, peeling or loosening of the skin, including inside the mouth -seizures -vomiting Side effects that usually do not require medical attention (report to your doctor or health care professional if they continue or are bothersome): -change in taste -diarrhea -hair loss -nausea -stomach pain This list may not describe all possible side effects. Call your doctor for medical advice about side effects. You may report side effects to FDA at 1-800-FDA-1088. Where should I keep my medicine? Keep out of the reach of children. Store in a refrigerator between 2 and 8 degrees C (36 and 46 degrees F). Do not freeze. Throw away any unused medicine after the expiration date. NOTE: This sheet is a summary. It may not cover all possible information. If you have questions about this medicine, talk to your doctor, pharmacist, or health care provider.  2019 Elsevier/Gold Standard (2015-06-25 11:49:52)

## 2018-10-17 ENCOUNTER — Other Ambulatory Visit: Payer: Self-pay

## 2018-10-17 ENCOUNTER — Ambulatory Visit
Admission: RE | Admit: 2018-10-17 | Discharge: 2018-10-17 | Disposition: A | Discharge status: Home / Self Care | Payer: Medicare Other | Source: Ambulatory Visit | Attending: Radiation Oncology | Admitting: Radiation Oncology

## 2018-10-17 ENCOUNTER — Inpatient Hospital Stay: Payer: Medicare Other

## 2018-10-17 VITALS — BP 164/99 | Temp 98.4°F | Resp 18

## 2018-10-17 DIAGNOSIS — C3431 Malignant neoplasm of lower lobe, right bronchus or lung: Secondary | ICD-10-CM | POA: Diagnosis not present

## 2018-10-17 MED ORDER — HEPARIN SOD (PORK) LOCK FLUSH 100 UNIT/ML IV SOLN
500.0000 [IU] | Freq: Once | INTRAVENOUS | Status: AC | PRN
  Administered 2018-10-17: 500 [IU]
  Filled 2018-10-17: qty 5

## 2018-10-17 MED ORDER — DEXAMETHASONE SODIUM PHOSPHATE 10 MG/ML IJ SOLN
INTRAMUSCULAR | Status: AC
  Filled 2018-10-17: qty 1

## 2018-10-17 MED ORDER — DEXAMETHASONE SODIUM PHOSPHATE 10 MG/ML IJ SOLN
10.0000 mg | Freq: Once | INTRAMUSCULAR | Status: AC
  Administered 2018-10-17: 10 mg via INTRAVENOUS

## 2018-10-17 MED ORDER — ACETAMINOPHEN 325 MG PO TABS
ORAL_TABLET | ORAL | Status: AC
  Filled 2018-10-17: qty 2

## 2018-10-17 MED ORDER — ACETAMINOPHEN 325 MG PO TABS
650.0000 mg | ORAL_TABLET | Freq: Once | ORAL | Status: AC
  Administered 2018-10-17: 650 mg via ORAL

## 2018-10-17 MED ORDER — SODIUM CHLORIDE 0.9 % IV SOLN
Freq: Once | INTRAVENOUS | Status: AC
  Administered 2018-10-17: 14:00:00 via INTRAVENOUS
  Filled 2018-10-17: qty 250

## 2018-10-17 MED ORDER — SODIUM CHLORIDE 0.9% FLUSH
10.0000 mL | INTRAVENOUS | Status: DC | PRN
  Administered 2018-10-17: 10 mL
  Filled 2018-10-17: qty 10

## 2018-10-17 MED ORDER — SODIUM CHLORIDE 0.9 % IV SOLN
95.0000 mg/m2 | Freq: Once | INTRAVENOUS | Status: AC
  Administered 2018-10-17: 14:00:00 200 mg via INTRAVENOUS
  Filled 2018-10-17: qty 10

## 2018-10-17 NOTE — Patient Instructions (Signed)
Coronavirus (COVID-19) Are you at risk?  Are you at risk for the Coronavirus (COVID-19)?  To be considered HIGH RISK for Coronavirus (COVID-19), you have to meet the following criteria:  . Traveled to [place], [place], [place], [place] or [place]; or in the [place] to [place], [place], [place], or [place]; and have fever, cough, and shortness of breath within the last 2 weeks of travel OR . Been in close contact with a person diagnosed with COVID-19 within the last 2 weeks and have fever, cough, and shortness of breath . IF YOU DO NOT MEET THESE CRITERIA, YOU ARE CONSIDERED LOW RISK FOR COVID-19.  What to do if you are HIGH RISK for COVID-19?  . If you are having a medical emergency, call 911. . Seek medical care right away. Before you go to a doctor's office, urgent care or emergency department, call ahead and tell them about your recent travel, contact with someone diagnosed with COVID-19, and your symptoms. You should receive instructions from your physician's office regarding next steps of care.  . When you arrive at healthcare provider, tell the healthcare staff immediately you have returned from visiting [place], [place], [place], [place] or [place]; or traveled in the [place] to [place], [place], [place], or [place]; in the last two weeks or you have been in close contact with a person diagnosed with COVID-19 in the last 2 weeks.   . Tell the health care staff about your symptoms: fever, cough and shortness of breath. . After you have been seen by a medical provider, you will be either: o Tested for (COVID-19) and discharged home on quarantine except to seek medical care if symptoms worsen, and asked to  - Stay home and avoid contact with others until you get your results (4-5 days)  - Avoid travel on public transportation if possible (such as bus, train, or airplane) or o Sent to the Emergency Department by EMS for evaluation, COVID-19 testing, and possible  admission depending on your condition and test results.  What to do if you are LOW RISK for COVID-19?  Reduce your risk of any infection by using the same precautions used for avoiding the common cold or flu:  . Wash your hands often with soap and warm water for at least 20 seconds.  If soap and water are not readily available, use an alcohol-based hand sanitizer with at least 60% alcohol.  . If coughing or sneezing, cover your mouth and nose by coughing or sneezing into the elbow areas of your shirt or coat, into a tissue or into your sleeve (not your hands). . Avoid shaking hands with others and consider head nods or verbal greetings only. . Avoid touching your eyes, nose, or mouth with unwashed hands.  . Avoid close contact with people who are sick. . Avoid places or events with large numbers of people in one location, like concerts or sporting events. . Carefully consider travel plans you have or are making. . If you are planning any travel outside or inside the [place], visit the CDC's Travelers' Health webpage for the latest health notices. . If you have some symptoms but not all symptoms, continue to monitor at home and seek medical attention if your symptoms worsen. . If you are having a medical emergency, call 911.   ADDITIONAL HEALTHCARE OPTIONS FOR PATIENTS  [facility] Telehealth / e-Visit: [email or web address]         [facility]: [phone]  [facility]   Urgent Care: [facility]: [facility] Discharge Instructions for Patients Receiving Chemotherapy  Today you received the following chemotherapy agents Etoposide (VEPESID).  To help prevent nausea and vomiting after your treatment, we encourage you to take your nausea medication as prescribed. DO NOT TAKE ZOFRAN FOR THE NEXT 3 DAYS, TAKE COMPAZINE.   If you develop nausea and vomiting that is not  controlled by your nausea medication, call the clinic.   BELOW ARE SYMPTOMS THAT SHOULD BE REPORTED IMMEDIATELY:  *FEVER GREATER THAN 100.5 F  *CHILLS WITH OR WITHOUT FEVER  NAUSEA AND VOMITING THAT IS NOT CONTROLLED WITH YOUR NAUSEA MEDICATION  *UNUSUAL SHORTNESS OF BREATH  *UNUSUAL BRUISING OR BLEEDING  TENDERNESS IN MOUTH AND THROAT WITH OR WITHOUT PRESENCE OF ULCERS  *URINARY PROBLEMS  *BOWEL PROBLEMS  UNUSUAL RASH Items with * indicate a potential emergency and should be followed up as soon as possible.  Feel free to call the clinic should you have any questions or concerns. The clinic phone number is [phone].  Please show the [facility] at check-in to the Emergency Department and triage nurse.

## 2018-10-17 NOTE — Progress Notes (Signed)
Pt with complaint of headache, rated 5/10. Per Sandi Mealy, ok to give tylenol.  See Mar.

## 2018-10-18 ENCOUNTER — Other Ambulatory Visit: Payer: Self-pay

## 2018-10-18 ENCOUNTER — Ambulatory Visit
Admission: RE | Admit: 2018-10-18 | Discharge: 2018-10-18 | Disposition: A | Discharge status: Home / Self Care | Payer: Medicare Other | Source: Ambulatory Visit | Attending: Radiation Oncology | Admitting: Radiation Oncology

## 2018-10-18 ENCOUNTER — Inpatient Hospital Stay: Payer: Medicare Other

## 2018-10-18 VITALS — BP 155/97 | HR 74 | Temp 98.2°F | Resp 18

## 2018-10-18 DIAGNOSIS — C3431 Malignant neoplasm of lower lobe, right bronchus or lung: Secondary | ICD-10-CM

## 2018-10-18 MED ORDER — DEXAMETHASONE SODIUM PHOSPHATE 10 MG/ML IJ SOLN
INTRAMUSCULAR | Status: AC
  Filled 2018-10-18: qty 1

## 2018-10-18 MED ORDER — DEXAMETHASONE SODIUM PHOSPHATE 10 MG/ML IJ SOLN
10.0000 mg | Freq: Once | INTRAMUSCULAR | Status: AC
  Administered 2018-10-18: 10 mg via INTRAVENOUS

## 2018-10-18 MED ORDER — SODIUM CHLORIDE 0.9 % IV SOLN
Freq: Once | INTRAVENOUS | Status: AC
  Administered 2018-10-18: 15:00:00 via INTRAVENOUS
  Filled 2018-10-18: qty 250

## 2018-10-18 MED ORDER — SODIUM CHLORIDE 0.9% FLUSH
10.0000 mL | INTRAVENOUS | Status: DC | PRN
  Administered 2018-10-18: 10 mL
  Filled 2018-10-18: qty 10

## 2018-10-18 MED ORDER — HEPARIN SOD (PORK) LOCK FLUSH 100 UNIT/ML IV SOLN
500.0000 [IU] | Freq: Once | INTRAVENOUS | Status: AC | PRN
  Administered 2018-10-18: 500 [IU]
  Filled 2018-10-18: qty 5

## 2018-10-18 MED ORDER — PEGFILGRASTIM 6 MG/0.6ML ~~LOC~~ PSKT
6.0000 mg | PREFILLED_SYRINGE | Freq: Once | SUBCUTANEOUS | Status: AC
  Administered 2018-10-18: 6 mg via SUBCUTANEOUS

## 2018-10-18 MED ORDER — SODIUM CHLORIDE 0.9 % IV SOLN
200.0000 mg | Freq: Once | INTRAVENOUS | Status: AC
  Administered 2018-10-18: 200 mg via INTRAVENOUS
  Filled 2018-10-18: qty 10

## 2018-10-18 MED ORDER — PEGFILGRASTIM 6 MG/0.6ML ~~LOC~~ PSKT
PREFILLED_SYRINGE | SUBCUTANEOUS | Status: AC
  Filled 2018-10-18: qty 0.6

## 2018-10-18 NOTE — Patient Instructions (Signed)
Coronavirus (COVID-19) Are you at risk?  Are you at risk for the Coronavirus (COVID-19)?  To be considered HIGH RISK for Coronavirus (COVID-19), you have to meet the following criteria:  . Traveled to [place], [place], [place], [place] or [place]; or in the [place] to [place], [place], [place], or [place]; and have fever, cough, and shortness of breath within the last 2 weeks of travel OR . Been in close contact with a person diagnosed with COVID-19 within the last 2 weeks and have fever, cough, and shortness of breath . IF YOU DO NOT MEET THESE CRITERIA, YOU ARE CONSIDERED LOW RISK FOR COVID-19.  What to do if you are HIGH RISK for COVID-19?  . If you are having a medical emergency, call 911. . Seek medical care right away. Before you go to a doctor's office, urgent care or emergency department, call ahead and tell them about your recent travel, contact with someone diagnosed with COVID-19, and your symptoms. You should receive instructions from your physician's office regarding next steps of care.  . When you arrive at healthcare provider, tell the healthcare staff immediately you have returned from visiting [place], [place], [place], [place] or [place]; or traveled in the [place] to [place], [place], [place], or [place]; in the last two weeks or you have been in close contact with a person diagnosed with COVID-19 in the last 2 weeks.   . Tell the health care staff about your symptoms: fever, cough and shortness of breath. . After you have been seen by a medical provider, you will be either: o Tested for (COVID-19) and discharged home on quarantine except to seek medical care if symptoms worsen, and asked to  - Stay home and avoid contact with others until you get your results (4-5 days)  - Avoid travel on public transportation if possible (such as bus, train, or airplane) or o Sent to the Emergency Department by EMS for evaluation, COVID-19 testing, and possible  admission depending on your condition and test results.  What to do if you are LOW RISK for COVID-19?  Reduce your risk of any infection by using the same precautions used for avoiding the common cold or flu:  . Wash your hands often with soap and warm water for at least 20 seconds.  If soap and water are not readily available, use an alcohol-based hand sanitizer with at least 60% alcohol.  . If coughing or sneezing, cover your mouth and nose by coughing or sneezing into the elbow areas of your shirt or coat, into a tissue or into your sleeve (not your hands). . Avoid shaking hands with others and consider head nods or verbal greetings only. . Avoid touching your eyes, nose, or mouth with unwashed hands.  . Avoid close contact with people who are sick. . Avoid places or events with large numbers of people in one location, like concerts or sporting events. . Carefully consider travel plans you have or are making. . If you are planning any travel outside or inside the [place], visit the CDC's Travelers' Health webpage for the latest health notices. . If you have some symptoms but not all symptoms, continue to monitor at home and seek medical attention if your symptoms worsen. . If you are having a medical emergency, call 911.   ADDITIONAL HEALTHCARE OPTIONS FOR PATIENTS  [facility] Telehealth / e-Visit: [email or web address]         [facility]: [phone]  [facility]   Urgent Care: [facility]: [facility] Discharge Instructions for Patients Receiving Chemotherapy  Today you received the following chemotherapy agents: Etoposide.  To help prevent nausea and vomiting after your treatment, we encourage you to take your nausea medication as directed.   If you develop nausea and vomiting that is not controlled by your nausea medication, call the clinic.   BELOW  ARE SYMPTOMS THAT SHOULD BE REPORTED IMMEDIATELY:  *FEVER GREATER THAN 100.5 F  *CHILLS WITH OR WITHOUT FEVER  NAUSEA AND VOMITING THAT IS NOT CONTROLLED WITH YOUR NAUSEA MEDICATION  *UNUSUAL SHORTNESS OF BREATH  *UNUSUAL BRUISING OR BLEEDING  TENDERNESS IN MOUTH AND THROAT WITH OR WITHOUT PRESENCE OF ULCERS  *URINARY PROBLEMS  *BOWEL PROBLEMS  UNUSUAL RASH Items with * indicate a potential emergency and should be followed up as soon as possible.  Feel free to call the clinic should you have any questions or concerns. The clinic phone number is [phone].  Please show the [facility] at check-in to the Emergency Department and triage nurse.

## 2018-10-21 ENCOUNTER — Ambulatory Visit
Admission: RE | Admit: 2018-10-21 | Discharge: 2018-10-21 | Disposition: A | Discharge status: Home / Self Care | Payer: Medicare Other | Source: Ambulatory Visit | Attending: Radiation Oncology | Admitting: Radiation Oncology

## 2018-10-21 ENCOUNTER — Other Ambulatory Visit: Payer: Self-pay

## 2018-10-21 DIAGNOSIS — C3431 Malignant neoplasm of lower lobe, right bronchus or lung: Secondary | ICD-10-CM | POA: Diagnosis not present

## 2018-10-21 DIAGNOSIS — Z510 Encounter for antineoplastic radiation therapy: Secondary | ICD-10-CM | POA: Diagnosis not present

## 2018-10-22 ENCOUNTER — Inpatient Hospital Stay (HOSPITAL_BASED_OUTPATIENT_CLINIC_OR_DEPARTMENT_OTHER): Payer: Medicare Other | Admitting: Medical

## 2018-10-22 ENCOUNTER — Ambulatory Visit
Admission: RE | Admit: 2018-10-22 | Discharge: 2018-10-22 | Disposition: A | Discharge status: Home / Self Care | Payer: Medicare Other | Source: Ambulatory Visit | Attending: Radiation Oncology | Admitting: Radiation Oncology

## 2018-10-22 ENCOUNTER — Other Ambulatory Visit: Payer: Self-pay

## 2018-10-22 ENCOUNTER — Inpatient Hospital Stay: Payer: Medicare Other

## 2018-10-22 ENCOUNTER — Telehealth: Payer: Self-pay | Admitting: *Deleted

## 2018-10-22 DIAGNOSIS — M545 Low back pain: Secondary | ICD-10-CM | POA: Diagnosis not present

## 2018-10-22 DIAGNOSIS — M1990 Unspecified osteoarthritis, unspecified site: Secondary | ICD-10-CM | POA: Diagnosis not present

## 2018-10-22 DIAGNOSIS — R5383 Other fatigue: Secondary | ICD-10-CM

## 2018-10-22 DIAGNOSIS — Z8673 Personal history of transient ischemic attack (TIA), and cerebral infarction without residual deficits: Secondary | ICD-10-CM

## 2018-10-22 DIAGNOSIS — E785 Hyperlipidemia, unspecified: Secondary | ICD-10-CM

## 2018-10-22 DIAGNOSIS — R197 Diarrhea, unspecified: Secondary | ICD-10-CM

## 2018-10-22 DIAGNOSIS — R634 Abnormal weight loss: Secondary | ICD-10-CM

## 2018-10-22 DIAGNOSIS — Z87891 Personal history of nicotine dependence: Secondary | ICD-10-CM | POA: Diagnosis not present

## 2018-10-22 DIAGNOSIS — G8929 Other chronic pain: Secondary | ICD-10-CM

## 2018-10-22 DIAGNOSIS — Z7689 Persons encountering health services in other specified circumstances: Secondary | ICD-10-CM | POA: Diagnosis not present

## 2018-10-22 DIAGNOSIS — Z79899 Other long term (current) drug therapy: Secondary | ICD-10-CM | POA: Diagnosis not present

## 2018-10-22 DIAGNOSIS — R630 Anorexia: Secondary | ICD-10-CM

## 2018-10-22 DIAGNOSIS — I10 Essential (primary) hypertension: Secondary | ICD-10-CM | POA: Diagnosis not present

## 2018-10-22 DIAGNOSIS — C3431 Malignant neoplasm of lower lobe, right bronchus or lung: Secondary | ICD-10-CM

## 2018-10-22 DIAGNOSIS — E860 Dehydration: Secondary | ICD-10-CM

## 2018-10-22 DIAGNOSIS — Z5111 Encounter for antineoplastic chemotherapy: Secondary | ICD-10-CM | POA: Diagnosis not present

## 2018-10-22 DIAGNOSIS — R1310 Dysphagia, unspecified: Secondary | ICD-10-CM | POA: Diagnosis not present

## 2018-10-22 LAB — CBC WITH DIFFERENTIAL (CANCER CENTER ONLY)
Abs Immature Granulocytes: 0 10*3/uL (ref 0.00–0.07)
Band Neutrophils: 1 %
Basophils Absolute: 0 10*3/uL (ref 0.0–0.1)
Basophils Relative: 0 %
Eosinophils Absolute: 0 10*3/uL (ref 0.0–0.5)
Eosinophils Relative: 0 %
HCT: 28.3 % — ABNORMAL LOW (ref 36.0–46.0)
Hemoglobin: 8.9 g/dL — ABNORMAL LOW (ref 12.0–15.0)
Lymphocytes Relative: 2 %
Lymphs Abs: 0.5 10*3/uL — ABNORMAL LOW (ref 0.7–4.0)
MCH: 27.5 pg (ref 26.0–34.0)
MCHC: 31.4 g/dL (ref 30.0–36.0)
MCV: 87.3 fL (ref 80.0–100.0)
Monocytes Absolute: 0.2 10*3/uL (ref 0.1–1.0)
Monocytes Relative: 1 %
Neutro Abs: 22.7 10*3/uL — ABNORMAL HIGH (ref 1.7–17.7)
Neutrophils Relative %: 96 %
Platelet Count: 121 10*3/uL — ABNORMAL LOW (ref 150–400)
RBC: 3.24 MIL/uL — ABNORMAL LOW (ref 3.87–5.11)
RDW: 21.2 % — ABNORMAL HIGH (ref 11.5–15.5)
WBC Count: 23.4 10*3/uL — ABNORMAL HIGH (ref 4.0–10.5)
nRBC: 0 % (ref 0.0–0.2)

## 2018-10-22 LAB — CMP (CANCER CENTER ONLY)
ALT: 9 U/L (ref 0–44)
AST: 12 U/L — ABNORMAL LOW (ref 15–41)
Albumin: 3.6 g/dL (ref 3.5–5.0)
Alkaline Phosphatase: 98 U/L (ref 38–126)
Anion gap: 11 (ref 5–15)
BUN: 38 mg/dL — ABNORMAL HIGH (ref 8–23)
CO2: 27 mmol/L (ref 22–32)
Calcium: 8.7 mg/dL — ABNORMAL LOW (ref 8.9–10.3)
Chloride: 100 mmol/L (ref 98–111)
Creatinine: 1.89 mg/dL — ABNORMAL HIGH (ref 0.44–1.00)
GFR, Est AFR Am: 32 mL/min — ABNORMAL LOW (ref >60)
GFR, Estimated: 27 mL/min — ABNORMAL LOW (ref >60)
Glucose, Bld: 95 mg/dL (ref 70–99)
Potassium: 4 mmol/L (ref 3.5–5.1)
Sodium: 138 mmol/L (ref 135–145)
Total Bilirubin: 0.5 mg/dL (ref 0.3–1.2)
Total Protein: 6.1 g/dL — ABNORMAL LOW (ref 6.5–8.1)

## 2018-10-22 LAB — MAGNESIUM: Magnesium: 1.3 mg/dL — CL (ref 1.7–2.4)

## 2018-10-22 MED ORDER — SODIUM CHLORIDE 0.9 % IV SOLN
4.0000 g | Freq: Once | INTRAVENOUS | Status: AC
  Administered 2018-10-22: 4 g via INTRAVENOUS
  Filled 2018-10-22: qty 8

## 2018-10-22 NOTE — Progress Notes (Signed)
Pt transported to infusion area for remainder of infusion.  VSS.  Tolerated well so far.  Able to drink without issue.  Report given chairside to [place].  Pt denies any further questions or concerns at this time.

## 2018-10-22 NOTE — Telephone Encounter (Signed)
Received call report from [facility].  "Today's Mg+ = 1.3."  Called [facility] with results.   Scheduled [facility] appt. today at 3:00 pm.

## 2018-10-22 NOTE — Patient Instructions (Signed)
Hypomagnesemia  Hypomagnesemia is a condition in which the level of magnesium in the blood is low. Magnesium is a mineral that is found in many foods. It is used in many different processes in the body. Hypomagnesemia can affect every organ in the body. In severe cases, it can cause life-threatening problems.  What are the causes?  This condition may be caused by:   Not getting enough magnesium in your diet.   Malnutrition.   Problems with absorbing magnesium from the intestines.   Dehydration.   Alcohol abuse.   Vomiting.   Severe or chronic diarrhea.   Some medicines, including medicines that make you urinate more (diuretics).   Certain diseases, such as kidney disease, diabetes, celiac disease, and overactive thyroid.  What are the signs or symptoms?  Symptoms of this condition include:   Loss of appetite.   Nausea and vomiting.   Involuntary shaking or trembling of a body part (tremor).   Muscle weakness.   Tingling in the arms and legs.   Sudden tightening of muscles (muscle spasms).   Confusion.   Psychiatric issues, such as depression, irritability, or psychosis.   A feeling of fluttering of the heart.   Seizures.  These symptoms are more severe if magnesium levels drop suddenly.  How is this diagnosed?  This condition may be diagnosed based on:   Your symptoms and medical history.   A physical exam.   Blood and urine tests.  How is this treated?  Treatment depends on the cause and the severity of the condition. It may be treated with:   A magnesium supplement. This can be taken in pill form. If the condition is severe, magnesium is usually given through an IV.   Changes to your diet. You may be directed to eat foods that have a lot of magnesium, such as green leafy vegetables, peas, beans, and nuts.   Stopping any intake of alcohol.  Follow these instructions at home:          Make sure that your diet includes foods with magnesium. Foods that have a lot of magnesium in them  include:  ? Green leafy vegetables, such as spinach and broccoli.  ? Beans and peas.  ? Nuts and seeds, such as almonds and sunflower seeds.  ? Whole grains, such as whole grain bread and fortified cereals.   Take magnesium supplements if your health care provider tells you to do that. Take them as directed.   Take over-the-counter and prescription medicines only as told by your health care provider.   Have your magnesium levels monitored as told by your health care provider.   When you are active, drink fluids that contain electrolytes.   Avoid drinking alcohol.   Keep all follow-up visits as told by your health care provider. This is important.  Contact a health care provider if:   You get worse instead of better.   Your symptoms return.  Get help right away if you:   Develop severe muscle weakness.   Have trouble breathing.   Feel that your heart is racing.  Summary   Hypomagnesemia is a condition in which the level of magnesium in the blood is low.   Hypomagnesemia can affect every organ in the body.   Treatment may include eating more foods that contain magnesium, taking magnesium supplements, and not drinking alcohol.   Have your magnesium levels monitored as told by your health care provider.  This information is not intended to replace advice given   to you by your health care provider. Make sure you discuss any questions you have with your health care provider.  Document Released: 03/29/2005 Document Revised: 06/04/2017 Document Reviewed: 06/04/2017  Elsevier Interactive Patient Education  2019 [employer]

## 2018-10-23 ENCOUNTER — Other Ambulatory Visit: Payer: Self-pay

## 2018-10-23 ENCOUNTER — Ambulatory Visit
Admission: RE | Admit: 2018-10-23 | Discharge: 2018-10-23 | Disposition: A | Discharge status: Home / Self Care | Payer: Medicare Other | Source: Ambulatory Visit | Attending: Radiation Oncology | Admitting: Radiation Oncology

## 2018-10-23 ENCOUNTER — Telehealth: Payer: Self-pay | Admitting: Medical Oncology

## 2018-10-23 DIAGNOSIS — C3431 Malignant neoplasm of lower lobe, right bronchus or lung: Secondary | ICD-10-CM | POA: Diagnosis not present

## 2018-10-23 NOTE — Telephone Encounter (Signed)
Per Julien Nordmann I LVM instructing pt to  increase her magnesium oxide by another 400 mg daily.

## 2018-10-24 ENCOUNTER — Other Ambulatory Visit: Payer: Self-pay | Admitting: Family Medicine

## 2018-10-24 ENCOUNTER — Ambulatory Visit
Admission: RE | Admit: 2018-10-24 | Discharge: 2018-10-24 | Disposition: A | Discharge status: Home / Self Care | Payer: Medicare Other | Source: Ambulatory Visit | Attending: Radiation Oncology | Admitting: Radiation Oncology

## 2018-10-24 ENCOUNTER — Telehealth: Payer: Self-pay | Admitting: *Deleted

## 2018-10-24 ENCOUNTER — Other Ambulatory Visit: Payer: Self-pay

## 2018-10-24 DIAGNOSIS — I10 Essential (primary) hypertension: Secondary | ICD-10-CM | POA: Diagnosis not present

## 2018-10-24 DIAGNOSIS — Z8673 Personal history of transient ischemic attack (TIA), and cerebral infarction without residual deficits: Secondary | ICD-10-CM | POA: Diagnosis not present

## 2018-10-24 DIAGNOSIS — Z5111 Encounter for antineoplastic chemotherapy: Secondary | ICD-10-CM | POA: Diagnosis not present

## 2018-10-24 DIAGNOSIS — Z87891 Personal history of nicotine dependence: Secondary | ICD-10-CM | POA: Diagnosis not present

## 2018-10-24 DIAGNOSIS — E860 Dehydration: Secondary | ICD-10-CM | POA: Diagnosis not present

## 2018-10-24 DIAGNOSIS — R630 Anorexia: Secondary | ICD-10-CM | POA: Diagnosis not present

## 2018-10-24 DIAGNOSIS — Z7689 Persons encountering health services in other specified circumstances: Secondary | ICD-10-CM | POA: Diagnosis not present

## 2018-10-24 DIAGNOSIS — R197 Diarrhea, unspecified: Secondary | ICD-10-CM | POA: Diagnosis not present

## 2018-10-24 DIAGNOSIS — E785 Hyperlipidemia, unspecified: Secondary | ICD-10-CM | POA: Diagnosis not present

## 2018-10-24 DIAGNOSIS — R1310 Dysphagia, unspecified: Secondary | ICD-10-CM | POA: Diagnosis not present

## 2018-10-24 DIAGNOSIS — C3431 Malignant neoplasm of lower lobe, right bronchus or lung: Secondary | ICD-10-CM | POA: Diagnosis present

## 2018-10-24 DIAGNOSIS — Z79899 Other long term (current) drug therapy: Secondary | ICD-10-CM | POA: Diagnosis not present

## 2018-10-24 DIAGNOSIS — M1990 Unspecified osteoarthritis, unspecified site: Secondary | ICD-10-CM | POA: Diagnosis not present

## 2018-10-24 DIAGNOSIS — Z510 Encounter for antineoplastic radiation therapy: Secondary | ICD-10-CM | POA: Diagnosis not present

## 2018-10-24 DIAGNOSIS — R5383 Other fatigue: Secondary | ICD-10-CM | POA: Diagnosis not present

## 2018-10-24 DIAGNOSIS — M545 Low back pain: Secondary | ICD-10-CM | POA: Diagnosis not present

## 2018-10-24 DIAGNOSIS — R634 Abnormal weight loss: Secondary | ICD-10-CM | POA: Diagnosis not present

## 2018-10-24 DIAGNOSIS — G8929 Other chronic pain: Secondary | ICD-10-CM | POA: Diagnosis not present

## 2018-10-24 NOTE — Telephone Encounter (Signed)
TCT patient regarding increasing the dosage of her magnesium olxide d/t ongoing issues with low Mg+ Spoke with patient and explained the above to her. She voiced understanding. She also asked about taking 'natural magnesium booster' she got in the mail.  Advised her to discuss with her pharmacist, not knowing exactly the makeup of this 'magnesium booster' She voiced understanding

## 2018-10-24 NOTE — Progress Notes (Signed)
[facility] Progress Note   [name] [id number] 1950-11-23 68 y.o.  [name] is managed by Dr. Fanny Bien. Mohamed  Actively treated with chemotherapy/immunotherapy/hormonal therapy: yes  Current therapy: Cisplatin and etoposide with Neulasta support and concurrent radiation therapy  Last treated: 10/16/2018 (cycle 3, day 1)  Next scheduled appointment with provider: 11/04/2018  Assessment: Plan:    Hypomagnesemia - Plan: magnesium sulfate 4 g in sodium chloride 0.9 % 1,000 mL  Anorexia  Dehydration  Small cell carcinoma of lower lobe of right lung (HCC)   Hypomagnesemia: The patient's labs returned today showing a magnesium level of 1.3.  She was given 4 g of magnesium in 1 L of normal saline today.  Anorexia and dehydration: The patient was given 1 L of normal saline today.  Her labs returned showing a creatinine of 1.89 and a BUN of 38.     Limited stage small cell carcinoma of the lung: The patient continues to be managed by Dr. Julien Nordmann and is status post cycle 3, day 1 of cisplatin and etoposide with Neulasta support and concurrent radiation therapy.  She is scheduled to be seen in follow-up on 11/12/2018.  Please see After Visit Summary for patient specific instructions.  Future Appointments  Date Time Provider [place]  10/25/2018  7:30 AM [facility] LINAC 1 [facility] None  10/28/2018  7:30 AM [facility] LINAC 1 [facility] None  10/29/2018  1:40 PM [facility] LINAC 1 [facility] None  10/29/2018  2:00 PM [facility] LAB 6 [facility] None  10/29/2018  2:15 PM [facility] [place] FLUSH [facility] None  10/30/2018  7:30 AM [facility] LINAC 1 [facility] None  10/31/2018  7:30 AM [facility] LINAC 1 [facility] None  11/01/2018  7:30 AM [facility] LINAC 1 [facility] None  11/04/2018 10:00 AM [facility] LINAC 1 [facility] None  11/04/2018 10:45 AM [facility] LAB 2 [facility] None  11/04/2018 11:00 AM [facility] [place] FLUSH  [facility] None  11/04/2018 11:30 AM Heilingoetter, Cassandra L, PA-C [facility] None  11/05/2018  7:30 AM [facility] LINAC 1 [facility] None  11/05/2018  8:00 AM [facility] INFUSION [facility] None  11/06/2018  2:00 PM [facility] INFUSION [facility] None  11/07/2018  2:00 PM [facility] INFUSION [facility] None  11/12/2018  8:00 AM [facility] LAB 1 [facility] None  11/12/2018  8:15 AM [facility] [place] FLUSH [facility] None  11/13/2018 10:40 AM Rutherford Guys, MD PCP-PC[facility]  11/19/2018  8:00 AM [facility] LAB 1 [facility] None  11/19/2018  8:15 AM [facility] [place] FLUSH [facility] None  11/25/2018 10:45 AM [facility] LAB 3 [facility] None  11/25/2018 11:00 AM [facility] [place] FLUSH [facility] None  11/25/2018 11:30 AM Heilingoetter, Cassandra L, PA-C [facility] None  11/26/2018  8:30 AM [facility] INFUSION [facility] None  11/27/2018  8:00 AM [facility] INFUSION [facility] None  11/28/2018  8:00 AM [facility] INFUSION [facility] None    No orders of the defined types were placed in this encounter.      Subjective:   Patient ID:  [name] is a 68 y.o. (DOB 1950/11/11) female.  Chief Complaint:  Chief Complaint  Patient presents with   Fatigue    HPI AMPARO DONALSON is a 68 year old female with a history of a limited stage small cell carcinoma of the lung who is managed by Dr. Earlie Server and is status post cycle 3, day 1 of cisplatin and etoposide given with Neulasta with her last cycle initiated on 10/16/2018.  She has been receiving concurrent radiation therapy.  [facility] was contacted today by  Dr. Sondra Come who reported that the patient was likely dehydrated and would need IV fluids.  She reports that she has had anorexia and has not been eating or drinking much due to esophageal pain after radiation.  She has had some diarrhea which is been relieved with her use of Imodium.  She reports fatigue and chronic lower back pain.  She denies fevers, chills,  sweats, nausea, or vomiting.  Medications: I have reviewed the patient's current medications.  Allergies: No Known Allergies  Past Medical History:  Diagnosis Date   Arthritis    Chronic pain    Hyperlipidemia    Hypertension    Low blood magnesium 10/04/2018   lung ca dx'd 06/2018   Stroke ([place])    06/19/2018 minor    Past Surgical History:  Procedure Laterality Date   BIOPSY  06/19/2018   Procedure: BIOPSY;  Surgeon: Laurence Spates, MD;  Location: [facility];  Service: Endoscopy;;   BIOPSY  10/11/2018   Procedure: BIOPSY;  Surgeon: Laurence Spates, MD;  Location: [facility];  Service: Endoscopy;;   ENDARTERECTOMY Right 06/26/2018   Procedure: ENDARTERECTOMY CAROTID RIGHT;  Surgeon: Serafina Mitchell, MD;  Location: [facility];  Service: Vascular;  Laterality: Right;   ESOPHAGOGASTRODUODENOSCOPY (EGD) WITH PROPOFOL N/A 06/19/2018   Procedure: ESOPHAGOGASTRODUODENOSCOPY (EGD) WITH PROPOFOL;  Surgeon: Laurence Spates, MD;  Location: [facility];  Service: Endoscopy;  Laterality: N/A;   FLEXIBLE SIGMOIDOSCOPY N/A 10/11/2018   Procedure: FLEXIBLE SIGMOIDOSCOPY;  Surgeon: Laurence Spates, MD;  Location: [facility];  Service: Endoscopy;  Laterality: N/A;   IR IMAGING GUIDED PORT INSERTION  10/07/2018   PATCH ANGIOPLASTY Right 06/26/2018   Procedure: PATCH ANGIOPLASTY USING A XENOSURE 1cm x 6cm BIOLOGIC PATCH;  Surgeon: Serafina Mitchell, MD;  Location: [facility];  Service: Vascular;  Laterality: Right;   VIDEO BRONCHOSCOPY WITH ENDOBRONCHIAL ULTRASOUND N/A 08/14/2018   Procedure: VIDEO BRONCHOSCOPY WITH ENDOBRONCHIAL ULTRASOUND;  Surgeon: Collene Gobble, MD;  Location: [facility];  Service: Thoracic;  Laterality: N/A;    Family History  Problem Relation Age of Onset   Cancer Mother        lung cancer   Diabetes Other    Hyperlipidemia Other    Hypertension Other    Cancer Other     Social History   Socioeconomic History   Marital status: Single    Spouse name: Not on  file   Number of children: 0   Years of education: Not on file   Highest education level: Not on file  Occupational History   Not on file  Social Needs   Financial resource strain: Not on file   Food insecurity:    Worry: Not on file    Inability: Not on file   Transportation needs:    Medical: Not on file    Non-medical: Not on file  Tobacco Use   Smoking status: Former Smoker    Packs/day: 1.00    Years: 10.00    Pack years: 10.00    Types: Cigarettes    Last attempt to quit: 06/19/2018    Years since quitting: 0.3   Smokeless tobacco: Never Used   Tobacco comment: Currently using Nicorette Gum  Substance and Sexual Activity   Alcohol use: Yes    Comment: wine 2 glasses a day    Drug use: Never    Comment: Hemp Oil   Sexual activity: Not Currently  Lifestyle   Physical activity:    Days per week: Not on file  Minutes per session: Not on file   Stress: Not on file  Relationships   Social connections:    Talks on phone: Not on file    Gets together: Not on file    Attends religious service: Not on file    Active member of club or organization: Not on file    Attends meetings of clubs or organizations: Not on file    Relationship status: Not on file   Intimate partner violence:    Fear of current or ex partner: Not on file    Emotionally abused: Not on file    Physically abused: Not on file    Forced sexual activity: Not on file  Other Topics Concern   Not on file  Social History Narrative   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   Please see review of systems for further details on the patient's review from today.   Review of Systems:  Review of Systems  Constitutional: Positive for appetite change and fatigue. Negative for activity change, chills,  diaphoresis, fever and unexpected weight change.  HENT: Positive for trouble swallowing.   Respiratory: Negative for cough, choking and shortness of breath.   Cardiovascular: Negative for chest pain.  Gastrointestinal: Positive for diarrhea. Negative for constipation, nausea and vomiting.    Objective:   Physical Exam:  BP 104/87 (BP Location: Left Arm, Patient Position: Sitting)    Pulse 84    Temp 98.5 F (36.9 C) (Oral)    Resp 18    Ht 5\' 5"  (1.651 m)    Wt 193 lb 9.6 oz (87.8 kg)    SpO2 97%    BMI 32.22 kg/m  ECOG: 1  Physical Exam Constitutional:      General: She is not in acute distress.    Appearance: She is not diaphoretic.  HENT:     Head: Normocephalic and atraumatic.     Right Ear: External ear normal.     Left Ear: External ear normal.     Mouth/Throat:     Pharynx: No oropharyngeal exudate.  Eyes:     General: No scleral icterus.       Right eye: No discharge.        Left eye: No discharge.     Conjunctiva/sclera: Conjunctivae normal.  Neck:     Musculoskeletal: Normal range of motion and neck supple.  Cardiovascular:     Rate and Rhythm: Normal rate and regular rhythm.     Heart sounds: Normal heart sounds. No murmur. No friction rub. No gallop.   Pulmonary:     Effort: Pulmonary effort is normal. No respiratory distress.     Breath sounds: Normal breath sounds. No wheezing or rales.  Lymphadenopathy:     Cervical: No cervical adenopathy.  Skin:    General: Skin is warm and dry.     Findings: No erythema or rash.  Neurological:     Mental Status: She is alert.     Gait: Gait normal.  Psychiatric:        Behavior: Behavior normal.        Thought Content: Thought content normal.        Judgment: Judgment normal.     Lab Review:     Component Value  Date/Time   NA 138 10/22/2018 1350   NA 142 08/02/2018 1042   K 4.0 10/22/2018 1350   CL 100 10/22/2018 1350   CO2 27 10/22/2018 1350   GLUCOSE 95 10/22/2018 1350   BUN 38 (H) 10/22/2018 1350    BUN 9 08/02/2018 1042   CREATININE 1.89 (H) 10/22/2018 1350   CALCIUM 8.7 (L) 10/22/2018 1350   PROT 6.1 (L) 10/22/2018 1350   PROT 6.7 08/02/2018 1042   ALBUMIN 3.6 10/22/2018 1350   ALBUMIN 4.0 08/02/2018 1042   AST 12 (L) 10/22/2018 1350   ALT 9 10/22/2018 1350   ALKPHOS 98 10/22/2018 1350   BILITOT 0.5 10/22/2018 1350   GFRNONAA 27 (L) 10/22/2018 1350   GFRAA 32 (L) 10/22/2018 1350       Component Value Date/Time   WBC 23.4 (H) 10/22/2018 1350   WBC 9.4 10/07/2018 1210   RBC 3.24 (L) 10/22/2018 1350   HGB 8.9 (L) 10/22/2018 1350   HGB 12.2 08/02/2018 1042   HCT 28.3 (L) 10/22/2018 1350   HCT 39.6 08/02/2018 1042   PLT 121 (L) 10/22/2018 1350   PLT 350 08/02/2018 1042   MCV 87.3 10/22/2018 1350   MCV 91 08/02/2018 1042   MCH 27.5 10/22/2018 1350   MCHC 31.4 10/22/2018 1350   RDW 21.2 (H) 10/22/2018 1350   RDW 14.3 08/02/2018 1042   LYMPHSABS 0.5 (L) 10/22/2018 1350   MONOABS 0.2 10/22/2018 1350   EOSABS 0.0 10/22/2018 1350   BASOSABS 0.0 10/22/2018 1350   -------------------------------  Imaging from last 24 hours (if applicable):  Radiology interpretation: Ct Chest W Contrast  Result Date: 10/11/2018 CLINICAL DATA:  68 year old female with history of lung cancer undergoing ongoing chemotherapy and radiation therapy. 15 pounds of weight loss. EXAM: CT CHEST WITH CONTRAST TECHNIQUE: Multidetector CT imaging of the chest was performed during intravenous contrast administration. CONTRAST:  28mL OMNIPAQUE IOHEXOL 300 MG/ML  SOLN COMPARISON:  Chest CT 08/09/2018.  PET-CT 08/30/2018. FINDINGS: Cardiovascular: Heart size is normal. There is no significant pericardial fluid, thickening or pericardial calcification. There is aortic atherosclerosis, as well as atherosclerosis of the great vessels of the mediastinum and the coronary arteries, including calcified atherosclerotic plaque in the left main, left anterior descending, left circumflex and right coronary arteries. Right  internal jugular single-lumen porta cath with tip terminating in the right atrium. Mediastinum/Nodes: Multiple enlarged mediastinal and right hilar lymph nodes are again noted. These are generally decreased in size compared to the prior study, largest of which measures up to 3.9 cm in short axis (axial image 58 of series 2), decreased from 4.1 cm on prior PET-CT 08/30/2018. Prevascular lymph node anterior to the superior vena cava currently measures 13 mm in short axis (previously 2.1 cm). Subcarinal lymph node measuring 1.8 cm in short axis (axial image 72 of series 2) previously 2.3 cm. Largest right hilar lymph node currently measures 1.5 cm (axial image 87 of series 2). Esophagus is unremarkable in appearance. No axillary lymphadenopathy. Lungs/Pleura: Previously noted right lower lobe mass (axial image 98 of series 7) has decreased in size, currently measuring 3.1 x 3.3 cm. There are some adjacent postobstructive changes, predominantly atelectasis, in the right lower lobe. No other definite suspicious appearing pulmonary nodules or masses. No acute consolidative airspace disease. No pleural effusions. Upper Abdomen: Small left adrenal nodules measuring up to 1.3 cm in the lateral limb of the left adrenal gland, similar to prior studies (previously not hypermetabolic on PET-CT), nonspecific. Aortic atherosclerosis. Musculoskeletal: There are no  aggressive appearing lytic or blastic lesions noted in the visualized portions of the skeleton. IMPRESSION: 1. Today's study demonstrates a positive response to therapy, with decreased size of right lower lobe mass as well as right hilar and mediastinal lymphadenopathy, as detailed above. 2. Stable small left adrenal nodules, previously not hypermetabolic on PET-CT. Continued attention on follow-up studies is recommended. 3. Aortic atherosclerosis, in addition to left main and 3 vessel coronary artery disease. Please note that although the presence of coronary artery  calcium documents the presence of coronary artery disease, the severity of this disease and any potential stenosis cannot be assessed on this non-gated CT examination. Assessment for potential risk factor modification, dietary therapy or pharmacologic therapy may be warranted, if clinically indicated. Aortic Atherosclerosis (ICD10-I70.0). Electronically Signed   By: Vinnie Langton M.D.   On: 10/11/2018 11:03   Ir Imaging Guided Port Insertion  Result Date: 10/07/2018 INDICATION: 68 year old with small cell lung cancer and poor venous access. EXAM: FLUOROSCOPIC AND ULTRASOUND GUIDED PLACEMENT OF A SUBCUTANEOUS PORT COMPARISON:  None. MEDICATIONS: Ancef 2 g; The antibiotic was administered within an appropriate time interval prior to skin puncture. ANESTHESIA/SEDATION: Versed 4.0 mg IV; Fentanyl 100 mcg IV; Moderate Sedation Time:  40 minutes The patient was continuously monitored during the procedure by the interventional radiology nurse under my direct supervision. FLUOROSCOPY TIME:  24 seconds, 5 mGy COMPLICATIONS: None immediate. PROCEDURE: The procedure, risks, benefits, and alternatives were explained to the patient. Questions regarding the procedure were encouraged and answered. The patient understands and consents to the procedure. Patient was placed supine on the interventional table. Ultrasound confirmed a patent right internal jugular vein. Ultrasound image was saved for documentation. The right chest and neck were cleaned with a skin antiseptic and a sterile drape was placed. Maximal barrier sterile technique was utilized including caps, mask, sterile gowns, sterile gloves, sterile drape, hand hygiene and skin antiseptic. The right neck was anesthetized with 1% lidocaine. Small incision was made in the right neck with a blade. Micropuncture set was placed in the right internal jugular vein with ultrasound guidance. The micropuncture wire was used for measurement purposes. The right chest was  anesthetized with 1% lidocaine with epinephrine. #15 blade was used to make an incision and a subcutaneous port pocket was formed. [place] was assembled. Subcutaneous tunnel was formed with a stiff tunneling device. The port catheter was brought through the subcutaneous tunnel. The port was placed in the subcutaneous pocket and sutured in place. The micropuncture set was exchanged for a peel-away sheath. The catheter was placed through the peel-away sheath and the tip was positioned at the SVC/right atrium junction. Catheter placement was confirmed with fluoroscopy. The port was accessed and flushed with heparinized saline. The port pocket was closed using two layers of absorbable sutures and Dermabond. The vein skin site was closed using a single layer of absorbable suture and Dermabond. Sterile dressings were applied. Patient tolerated the procedure well without an immediate complication. Ultrasound and fluoroscopic images were taken and saved for this procedure. IMPRESSION: Placement of a subcutaneous port device. Catheter tip at the SVC/right atrium junction. Electronically Signed   By: Markus Daft M.D.   On: 10/07/2018 16:13

## 2018-10-25 ENCOUNTER — Ambulatory Visit
Admission: RE | Admit: 2018-10-25 | Discharge: 2018-10-25 | Disposition: A | Discharge status: Home / Self Care | Payer: Medicare Other | Source: Ambulatory Visit | Attending: Radiation Oncology | Admitting: Radiation Oncology

## 2018-10-25 ENCOUNTER — Other Ambulatory Visit: Payer: Self-pay

## 2018-10-25 DIAGNOSIS — Z510 Encounter for antineoplastic radiation therapy: Secondary | ICD-10-CM | POA: Diagnosis not present

## 2018-10-28 ENCOUNTER — Ambulatory Visit
Admission: RE | Admit: 2018-10-28 | Discharge: 2018-10-28 | Disposition: A | Discharge status: Home / Self Care | Payer: Medicare Other | Source: Ambulatory Visit | Attending: Radiation Oncology | Admitting: Radiation Oncology

## 2018-10-28 ENCOUNTER — Other Ambulatory Visit: Payer: Self-pay

## 2018-10-28 ENCOUNTER — Telehealth: Payer: Self-pay | Admitting: Medical Oncology

## 2018-10-28 DIAGNOSIS — Z510 Encounter for antineoplastic radiation therapy: Secondary | ICD-10-CM | POA: Diagnosis not present

## 2018-10-28 NOTE — Telephone Encounter (Signed)
Pt instructed to increase Mag sulfate by one tablet /day so she should be taking mag oxide 1 tablet qid

## 2018-10-29 ENCOUNTER — Other Ambulatory Visit: Payer: Self-pay | Admitting: Physician Assistant

## 2018-10-29 ENCOUNTER — Inpatient Hospital Stay: Payer: Medicare Other

## 2018-10-29 ENCOUNTER — Emergency Department (HOSPITAL_COMMUNITY): Payer: Medicare Other

## 2018-10-29 ENCOUNTER — Other Ambulatory Visit: Payer: Self-pay

## 2018-10-29 ENCOUNTER — Telehealth: Payer: Self-pay | Admitting: Medical Oncology

## 2018-10-29 ENCOUNTER — Other Ambulatory Visit: Payer: Self-pay | Admitting: Medical Oncology

## 2018-10-29 ENCOUNTER — Ambulatory Visit
Admission: RE | Admit: 2018-10-29 | Discharge: 2018-10-29 | Disposition: A | Discharge status: Home / Self Care | Payer: Medicare Other | Source: Ambulatory Visit | Attending: Radiation Oncology | Admitting: Radiation Oncology

## 2018-10-29 ENCOUNTER — Inpatient Hospital Stay (HOSPITAL_COMMUNITY)
Admission: EM | Admit: 2018-10-29 | Discharge: 2018-11-03 | DRG: 683 | Disposition: A | Discharge status: Home / Self Care | Payer: Medicare Other | Attending: Internal Medicine | Admitting: Internal Medicine

## 2018-10-29 DIAGNOSIS — Z7982 Long term (current) use of aspirin: Secondary | ICD-10-CM

## 2018-10-29 DIAGNOSIS — G8929 Other chronic pain: Secondary | ICD-10-CM | POA: Diagnosis present

## 2018-10-29 DIAGNOSIS — K529 Noninfective gastroenteritis and colitis, unspecified: Secondary | ICD-10-CM

## 2018-10-29 DIAGNOSIS — D696 Thrombocytopenia, unspecified: Secondary | ICD-10-CM

## 2018-10-29 DIAGNOSIS — R197 Diarrhea, unspecified: Secondary | ICD-10-CM

## 2018-10-29 DIAGNOSIS — Z8249 Family history of ischemic heart disease and other diseases of the circulatory system: Secondary | ICD-10-CM

## 2018-10-29 DIAGNOSIS — Z801 Family history of malignant neoplasm of trachea, bronchus and lung: Secondary | ICD-10-CM

## 2018-10-29 DIAGNOSIS — E876 Hypokalemia: Secondary | ICD-10-CM | POA: Diagnosis present

## 2018-10-29 DIAGNOSIS — R112 Nausea with vomiting, unspecified: Secondary | ICD-10-CM | POA: Diagnosis present

## 2018-10-29 DIAGNOSIS — K279 Peptic ulcer, site unspecified, unspecified as acute or chronic, without hemorrhage or perforation: Secondary | ICD-10-CM | POA: Diagnosis present

## 2018-10-29 DIAGNOSIS — Z8673 Personal history of transient ischemic attack (TIA), and cerebral infarction without residual deficits: Secondary | ICD-10-CM

## 2018-10-29 DIAGNOSIS — K649 Unspecified hemorrhoids: Secondary | ICD-10-CM | POA: Diagnosis present

## 2018-10-29 DIAGNOSIS — C3431 Malignant neoplasm of lower lobe, right bronchus or lung: Secondary | ICD-10-CM

## 2018-10-29 DIAGNOSIS — N183 Chronic kidney disease, stage 3 (moderate): Secondary | ICD-10-CM | POA: Diagnosis present

## 2018-10-29 DIAGNOSIS — K259 Gastric ulcer, unspecified as acute or chronic, without hemorrhage or perforation: Secondary | ICD-10-CM | POA: Diagnosis present

## 2018-10-29 DIAGNOSIS — D649 Anemia, unspecified: Secondary | ICD-10-CM | POA: Diagnosis present

## 2018-10-29 DIAGNOSIS — T451X5A Adverse effect of antineoplastic and immunosuppressive drugs, initial encounter: Secondary | ICD-10-CM | POA: Diagnosis present

## 2018-10-29 DIAGNOSIS — Z87891 Personal history of nicotine dependence: Secondary | ICD-10-CM

## 2018-10-29 DIAGNOSIS — Z8711 Personal history of peptic ulcer disease: Secondary | ICD-10-CM

## 2018-10-29 DIAGNOSIS — Z79899 Other long term (current) drug therapy: Secondary | ICD-10-CM

## 2018-10-29 DIAGNOSIS — I129 Hypertensive chronic kidney disease with stage 1 through stage 4 chronic kidney disease, or unspecified chronic kidney disease: Secondary | ICD-10-CM | POA: Diagnosis present

## 2018-10-29 DIAGNOSIS — E785 Hyperlipidemia, unspecified: Secondary | ICD-10-CM | POA: Diagnosis present

## 2018-10-29 DIAGNOSIS — N179 Acute kidney failure, unspecified: Secondary | ICD-10-CM | POA: Diagnosis not present

## 2018-10-29 DIAGNOSIS — I959 Hypotension, unspecified: Secondary | ICD-10-CM | POA: Diagnosis present

## 2018-10-29 DIAGNOSIS — A419 Sepsis, unspecified organism: Secondary | ICD-10-CM | POA: Diagnosis present

## 2018-10-29 DIAGNOSIS — K269 Duodenal ulcer, unspecified as acute or chronic, without hemorrhage or perforation: Secondary | ICD-10-CM | POA: Diagnosis present

## 2018-10-29 DIAGNOSIS — I10 Essential (primary) hypertension: Secondary | ICD-10-CM | POA: Diagnosis present

## 2018-10-29 DIAGNOSIS — R51 Headache: Secondary | ICD-10-CM | POA: Diagnosis not present

## 2018-10-29 DIAGNOSIS — D500 Iron deficiency anemia secondary to blood loss (chronic): Secondary | ICD-10-CM | POA: Diagnosis present

## 2018-10-29 DIAGNOSIS — K922 Gastrointestinal hemorrhage, unspecified: Secondary | ICD-10-CM | POA: Diagnosis present

## 2018-10-29 DIAGNOSIS — D61818 Other pancytopenia: Secondary | ICD-10-CM | POA: Diagnosis present

## 2018-10-29 LAB — POCT I-STAT EG7
Acid-base deficit: 3 mmol/L — ABNORMAL HIGH (ref 0.0–2.0)
Bicarbonate: 22.6 mmol/L (ref 20.0–28.0)
Calcium, Ion: 1.2 mmol/L (ref 1.15–1.40)
HCT: 23 % — ABNORMAL LOW (ref 36.0–46.0)
Hemoglobin: 7.8 g/dL — ABNORMAL LOW (ref 12.0–15.0)
O2 Saturation: 76 %
Potassium: 2.5 mmol/L — CL (ref 3.5–5.1)
Sodium: 139 mmol/L (ref 135–145)
TCO2: 24 mmol/L (ref 22–32)
pCO2, Ven: 43.4 mmHg — ABNORMAL LOW (ref 44.0–60.0)
pH, Ven: 7.325 (ref 7.250–7.430)
pO2, Ven: 44 mmHg (ref 32.0–45.0)

## 2018-10-29 LAB — CBC WITH DIFFERENTIAL (CANCER CENTER ONLY)
Abs Immature Granulocytes: 0.1 10*3/uL — ABNORMAL HIGH (ref 0.00–0.07)
Band Neutrophils: 1 %
Basophils Absolute: 0 10*3/uL (ref 0.0–0.1)
Basophils Relative: 1 %
Eosinophils Absolute: 0 10*3/uL (ref 0.0–0.5)
Eosinophils Relative: 0 %
HCT: 23.7 % — ABNORMAL LOW (ref 36.0–46.0)
Hemoglobin: 7.5 g/dL — ABNORMAL LOW (ref 12.0–15.0)
Lymphocytes Relative: 20 %
Lymphs Abs: 0.5 10*3/uL — ABNORMAL LOW (ref 0.7–4.0)
MCH: 27.6 pg (ref 26.0–34.0)
MCHC: 31.6 g/dL (ref 30.0–36.0)
MCV: 87.1 fL (ref 80.0–100.0)
Metamyelocytes Relative: 4 %
Monocytes Absolute: 0.1 10*3/uL (ref 0.1–1.0)
Monocytes Relative: 5 %
Neutro Abs: 1.8 10*3/uL (ref 1.7–17.7)
Neutrophils Relative %: 70 %
Platelet Count: 7 10*3/uL — CL (ref 150–400)
RBC: 2.72 MIL/uL — ABNORMAL LOW (ref 3.87–5.11)
RDW: 20.5 % — ABNORMAL HIGH (ref 11.5–15.5)
WBC Count: 2.6 10*3/uL — ABNORMAL LOW (ref 4.0–10.5)
nRBC: 0 % (ref 0.0–0.2)

## 2018-10-29 LAB — COMPREHENSIVE METABOLIC PANEL
ALT: 9 U/L (ref 0–44)
AST: 11 U/L — ABNORMAL LOW (ref 15–41)
Albumin: 3.4 g/dL — ABNORMAL LOW (ref 3.5–5.0)
Alkaline Phosphatase: 83 U/L (ref 38–126)
Anion gap: 9 (ref 5–15)
BUN: 33 mg/dL — ABNORMAL HIGH (ref 8–23)
CO2: 23 mmol/L (ref 22–32)
Calcium: 8.5 mg/dL — ABNORMAL LOW (ref 8.9–10.3)
Chloride: 105 mmol/L (ref 98–111)
Creatinine, Ser: 2.31 mg/dL — ABNORMAL HIGH (ref 0.44–1.00)
GFR calc Af Amer: 25 mL/min — ABNORMAL LOW (ref >60)
GFR calc non Af Amer: 21 mL/min — ABNORMAL LOW (ref >60)
Glucose, Bld: 156 mg/dL — ABNORMAL HIGH (ref 70–99)
Potassium: 2.4 mmol/L — CL (ref 3.5–5.1)
Sodium: 137 mmol/L (ref 135–145)
Total Bilirubin: 0.5 mg/dL (ref 0.3–1.2)
Total Protein: 5.8 g/dL — ABNORMAL LOW (ref 6.5–8.1)

## 2018-10-29 LAB — MAGNESIUM
Magnesium: 1.9 mg/dL (ref 1.7–2.4)
Magnesium: 1.8 mg/dL (ref 1.7–2.4)

## 2018-10-29 LAB — CMP (CANCER CENTER ONLY)
ALT: 7 U/L (ref 0–44)
AST: 11 U/L — ABNORMAL LOW (ref 15–41)
Albumin: 3.5 g/dL (ref 3.5–5.0)
Alkaline Phosphatase: 91 U/L (ref 38–126)
Anion gap: 10 (ref 5–15)
BUN: 31 mg/dL — ABNORMAL HIGH (ref 8–23)
CO2: 25 mmol/L (ref 22–32)
Calcium: 9.1 mg/dL (ref 8.9–10.3)
Chloride: 104 mmol/L (ref 98–111)
Creatinine: 2.25 mg/dL — ABNORMAL HIGH (ref 0.44–1.00)
GFR, Est AFR Am: 26 mL/min — ABNORMAL LOW (ref >60)
GFR, Estimated: 22 mL/min — ABNORMAL LOW (ref >60)
Glucose, Bld: 121 mg/dL — ABNORMAL HIGH (ref 70–99)
Potassium: 4 mmol/L (ref 3.5–5.1)
Sodium: 139 mmol/L (ref 135–145)
Total Bilirubin: 0.2 mg/dL — ABNORMAL LOW (ref 0.3–1.2)
Total Protein: 6.3 g/dL — ABNORMAL LOW (ref 6.5–8.1)

## 2018-10-29 LAB — CBC WITH DIFFERENTIAL/PLATELET
Abs Immature Granulocytes: 0.02 10*3/uL (ref 0.00–0.07)
Basophils Absolute: 0 10*3/uL (ref 0.0–0.1)
Basophils Relative: 1 %
Eosinophils Absolute: 0 10*3/uL (ref 0.0–0.5)
Eosinophils Relative: 0 %
HCT: 25.7 % — ABNORMAL LOW (ref 36.0–46.0)
Hemoglobin: 8 g/dL — ABNORMAL LOW (ref 12.0–15.0)
Immature Granulocytes: 1 %
Lymphocytes Relative: 8 %
Lymphs Abs: 0.2 10*3/uL — ABNORMAL LOW (ref 0.7–4.0)
MCH: 28 pg (ref 26.0–34.0)
MCHC: 31.1 g/dL (ref 30.0–36.0)
MCV: 89.9 fL (ref 80.0–100.0)
Monocytes Absolute: 0.2 10*3/uL (ref 0.1–1.0)
Monocytes Relative: 8 %
Neutro Abs: 2.4 10*3/uL (ref 1.7–7.7)
Neutrophils Relative %: 82 %
Platelets: 48 10*3/uL — ABNORMAL LOW (ref 150–400)
RBC: 2.86 MIL/uL — ABNORMAL LOW (ref 3.87–5.11)
RDW: 20.6 % — ABNORMAL HIGH (ref 11.5–15.5)
WBC: 2.9 10*3/uL — ABNORMAL LOW (ref 4.0–10.5)
nRBC: 0 % (ref 0.0–0.2)

## 2018-10-29 LAB — PREPARE RBC (CROSSMATCH)

## 2018-10-29 LAB — LACTIC ACID, PLASMA: Lactic Acid, Venous: 1.2 mmol/L (ref 0.5–1.9)

## 2018-10-29 LAB — PROTIME-INR
INR: 0.9 (ref 0.8–1.2)
Prothrombin Time: 12.2 seconds (ref 11.4–15.2)

## 2018-10-29 LAB — POC OCCULT BLOOD, ED: Fecal Occult Bld: NEGATIVE

## 2018-10-29 LAB — SAMPLE TO BLOOD BANK

## 2018-10-29 LAB — ABO/RH: ABO/RH(D): A POS

## 2018-10-29 MED ORDER — SODIUM CHLORIDE 0.9% FLUSH
10.0000 mL | INTRAVENOUS | Status: AC | PRN
  Administered 2018-10-29: 17:00:00 10 mL
  Filled 2018-10-29: qty 10

## 2018-10-29 MED ORDER — ACETAMINOPHEN 325 MG PO TABS
ORAL_TABLET | ORAL | Status: AC
  Filled 2018-10-29: qty 2

## 2018-10-29 MED ORDER — DIPHENHYDRAMINE HCL 25 MG PO CAPS
ORAL_CAPSULE | ORAL | Status: AC
  Filled 2018-10-29: qty 1

## 2018-10-29 MED ORDER — HEPARIN SOD (PORK) LOCK FLUSH 100 UNIT/ML IV SOLN
500.0000 [IU] | Freq: Every day | INTRAVENOUS | Status: AC | PRN
  Administered 2018-10-29: 17:00:00 500 [IU]
  Filled 2018-10-29: qty 5

## 2018-10-29 MED ORDER — SODIUM CHLORIDE 0.9% IV SOLUTION
250.0000 mL | Freq: Once | INTRAVENOUS | Status: AC
  Administered 2018-10-29: 250 mL via INTRAVENOUS
  Filled 2018-10-29: qty 250

## 2018-10-29 MED ORDER — DIPHENHYDRAMINE HCL 25 MG PO CAPS
25.0000 mg | ORAL_CAPSULE | Freq: Once | ORAL | Status: AC
  Administered 2018-10-29: 25 mg via ORAL

## 2018-10-29 MED ORDER — PIPERACILLIN-TAZOBACTAM 3.375 G IVPB 30 MIN
3.3750 g | Freq: Once | INTRAVENOUS | Status: AC
  Administered 2018-10-29: 3.375 g via INTRAVENOUS
  Filled 2018-10-29: qty 50

## 2018-10-29 MED ORDER — ACETAMINOPHEN 325 MG PO TABS
650.0000 mg | ORAL_TABLET | Freq: Once | ORAL | Status: AC
  Administered 2018-10-29: 650 mg via ORAL

## 2018-10-29 MED ORDER — PANTOPRAZOLE SODIUM 40 MG IV SOLR
40.0000 mg | Freq: Two times a day (BID) | INTRAVENOUS | Status: DC
  Administered 2018-11-02 (×2): 40 mg via INTRAVENOUS
  Filled 2018-10-29 (×2): qty 40

## 2018-10-29 MED ORDER — SODIUM CHLORIDE 0.9 % IV BOLUS: 1000.0000 mL | Freq: Once | INTRAVENOUS | Status: DC

## 2018-10-29 MED ORDER — PANTOPRAZOLE SODIUM 40 MG IV SOLR: 40.0000 mg | Freq: Once | INTRAVENOUS | Status: DC

## 2018-10-29 MED ORDER — SODIUM CHLORIDE 0.9% IV SOLUTION
Freq: Once | INTRAVENOUS | Status: AC
  Administered 2018-10-29: 22:00:00 via INTRAVENOUS

## 2018-10-29 MED ORDER — FENTANYL CITRATE (PF) 100 MCG/2ML IJ SOLN
25.0000 ug | Freq: Once | INTRAMUSCULAR | Status: AC
  Administered 2018-10-29: 25 ug via INTRAVENOUS
  Filled 2018-10-29: qty 2

## 2018-10-29 MED ORDER — SODIUM CHLORIDE 0.9 % IV SOLN
80.0000 mg | Freq: Once | INTRAVENOUS | Status: AC
  Administered 2018-10-29: 80 mg via INTRAVENOUS
  Filled 2018-10-29: qty 80

## 2018-10-29 MED ORDER — SODIUM CHLORIDE 0.9 % IV SOLN
8.0000 mg/h | INTRAVENOUS | Status: DC
  Administered 2018-10-29 – 2018-10-31 (×3): 8 mg/h via INTRAVENOUS
  Filled 2018-10-29 (×6): qty 80

## 2018-10-29 MED ORDER — POTASSIUM CHLORIDE 10 MEQ/100ML IV SOLN
10.0000 meq | INTRAVENOUS | Status: AC
  Administered 2018-10-29 – 2018-10-30 (×6): 10 meq via INTRAVENOUS
  Filled 2018-10-29 (×6): qty 100

## 2018-10-29 NOTE — ED Notes (Signed)
Bed: BT24 Expected date:  Expected time:  Means of arrival:  Comments: EMS - Cancer Pt/Hypotensive

## 2018-10-29 NOTE — ED Triage Notes (Signed)
Per EMS, Pt was diagnosed with lung cancer in December. Pt complaining of Hypotension and left lower back pain. Pt has HTX of back pain but having acute episode today. Pts BP was 72/49.

## 2018-10-29 NOTE — Patient Instructions (Signed)
Coronavirus (COVID-19) Are you at risk? ° °Are you at risk for the Coronavirus (COVID-19)? ° °To be considered HIGH RISK for Coronavirus (COVID-19), you have to meet the following criteria: ° °• Traveled to [place], [place], [place], [place] or [place]; or in the [place] to [place], [place], [place], or [place]; and have fever, cough, and shortness of breath within the last 2 weeks of travel OR °• Been in close contact with a person diagnosed with COVID-19 within the last 2 weeks and have fever, cough, and shortness of breath °• IF YOU DO NOT MEET THESE CRITERIA, YOU ARE CONSIDERED LOW RISK FOR COVID-19. ° °What to do if you are HIGH RISK for COVID-19? ° °• If you are having a medical emergency, call 911. °• Seek medical care right away. Before you go to a doctor’s office, urgent care or emergency department, call ahead and tell them about your recent travel, contact with someone diagnosed with COVID-19, and your symptoms. You should receive instructions from your physician’s office regarding next steps of care.  °• When you arrive at healthcare provider, tell the healthcare staff immediately you have returned from visiting [place], [place], [place], [place] or [place]; or traveled in the [place] to [place], [place], [place], or [place]; in the last two weeks or you have been in close contact with a person diagnosed with COVID-19 in the last 2 weeks.   °• Tell the health care staff about your symptoms: fever, cough and shortness of breath. °• After you have been seen by a medical provider, you will be either: °o Tested for (COVID-19) and discharged home on quarantine except to seek medical care if symptoms worsen, and asked to  °- Stay home and avoid contact with others until you get your results (4-5 days)  °- Avoid travel on public transportation if possible (such as bus, train, or airplane) or °o Sent to the Emergency Department by EMS for evaluation, COVID-19 testing, and possible  admission depending on your condition and test results. ° °What to do if you are LOW RISK for COVID-19? ° °Reduce your risk of any infection by using the same precautions used for avoiding the common cold or flu:  °• Wash your hands often with soap and warm water for at least 20 seconds.  If soap and water are not readily available, use an alcohol-based hand sanitizer with at least 60% alcohol.  °• If coughing or sneezing, cover your mouth and nose by coughing or sneezing into the elbow areas of your shirt or coat, into a tissue or into your sleeve (not your hands). °• Avoid shaking hands with others and consider head nods or verbal greetings only. °• Avoid touching your eyes, nose, or mouth with unwashed hands.  °• Avoid close contact with people who are sick. °• Avoid places or events with large numbers of people in one location, like concerts or sporting events. °• Carefully consider travel plans you have or are making. °• If you are planning any travel outside or inside the [place], visit the CDC’s Travelers’ Health webpage for the latest health notices. °• If you have some symptoms but not all symptoms, continue to monitor at home and seek medical attention if your symptoms worsen. °• If you are having a medical emergency, call 911. ° ° °ADDITIONAL HEALTHCARE OPTIONS FOR PATIENTS ° °[facility] / e-Visit: [email or web address] °        °[facility]: [phone] ° °[facility]: [phone]                   [facility]: [phone]   Platelet Transfusion A platelet transfusion is a procedure in which you receive donated platelets through an IV. Platelets are tiny pieces of blood cells. When you get an injury, platelets clump together in the area to form a blood clot. This helps stop bleeding and is the beginning of the healing process. If you have too few platelets, your blood may have trouble clotting. This may cause  you to bleed and bruise very easily. You may need a platelet transfusion if you have a condition that causes a low number of platelets (thrombocytopenia). A platelet transfusion may be used to stop or prevent excessive bleeding. Tell a health care provider about:  Any reactions you have had during previous transfusions.  Any allergies you have.  All medicines you are taking, including vitamins, herbs, eye drops, creams, and over-the-counter medicines.  Any blood disorders you have.  Any surgeries you have had.  Any medical conditions you have.  Whether you are pregnant or may be pregnant. What are the risks? Generally, this is a safe procedure. However, problems may occur, including:  Fever.  Infection.  Allergic reaction to the donor platelets.  Your body's disease-fighting system (immune system) attacking the donor platelets (hemolytic reaction). This is rare.  A rare reaction that causes lung damage (transfusion-related acute lung injury). What happens before the procedure? Medicines  Ask your health care provider about: ? Changing or stopping your regular medicines. This is especially important if you are taking diabetes medicines or blood thinners. ? Taking medicines such as aspirin and ibuprofen. These medicines can thin your blood. Do not take these medicines unless your health care provider tells you to take them. ? Taking over-the-counter medicines, vitamins, herbs, and supplements. General instructions  You will have a blood test to determine your blood type. Your blood type determines what kind of platelets you will be given.  Follow instructions from your health care provider about eating or drinking restrictions.  If you have had an allergic reaction to a transfusion in the past, you may be given medicine to help prevent a reaction.  Your temperature, blood pressure, pulse, and breathing will be monitored. What happens during the procedure?   An IV will be  inserted into one of your veins.  For your safety, two health care providers will verify your identity along with the donor platelets about to be infused.  A bag of donor platelets will be connected to your IV. The platelets will flow into your bloodstream. This usually takes 30-60 minutes.  Your temperature, blood pressure, pulse, and breathing will be monitored during the transfusion. This helps detect early signs of any reaction.  You will also be monitored for other symptoms that may indicate a reaction, including chills, hives, or itching.  If you have signs of a reaction at any time, your transfusion will be stopped, and you may be given medicine to help manage the reaction.  When your transfusion is complete, your IV will be removed.  Pressure may be applied to the IV site for a few minutes to stop any bleeding.  The IV site will be covered with a bandage (dressing). The procedure may vary among health care providers and hospitals. What happens after the procedure?  Your blood pressure, temperature, pulse, and breathing will be monitored until you leave the hospital or clinic.  You may  have some bruising and soreness at your IV site. Follow these instructions at home: Medicines  Take over-the-counter and prescription medicines only as told by your health care provider.  Talk with your health care provider before you take any medicines that contain aspirin or NSAIDs. These medicines increase your risk for dangerous bleeding. General instructions  Change or remove your dressing as told by your health care provider.  Return to your normal activities as told by your health care provider. Ask your health care provider what activities are safe for you.  Do not take baths, swim, or use a hot tub until your health care provider approves. Ask your health care provider if you may take showers.  Check your IV site every day for signs of infection. Check for: ? Redness, swelling, or  pain. ? Fluid or blood. If fluid or blood drains from your IV site, use your hands to press down firmly on a bandage covering the area for a minute or two. Doing this should stop the bleeding. ? Warmth. ? Pus or a bad smell.  Keep all follow-up visits as told by your health care provider. This is important. Contact a health care provider if you have:  A headache that does not go away with medicine.  Hives, rash, or itchy skin.  Nausea or vomiting.  Unusual tiredness or weakness.  Signs of infection at your IV site. Get help right away if:  You have a fever or chills.  You urinate less often than usual.  Your urine is darker colored than normal.  You have any of the following: ? Trouble breathing. ? Pain in your back, abdomen, or chest. ? Cool, clammy skin. ? A fast heartbeat. Summary  Platelets are tiny pieces of blood cells that clump together to form a blood clot when you have an injury. If you have too few platelets, your blood may have trouble clotting.  A platelet transfusion is a procedure in which you receive donated platelets through an IV.  A platelet transfusion may be used to stop or prevent excessive bleeding.  After the procedure, check your IV site every day for signs of infection, including redness, swelling, pain, or warmth. This information is not intended to replace advice given to you by your health care provider. Make sure you discuss any questions you have with your health care provider. Document Released: 04/30/2007 Document Revised: 08/08/2017 Document Reviewed: 08/08/2017 Elsevier Interactive Patient Education  2019 [employer].

## 2018-10-29 NOTE — ED Provider Notes (Signed)
[facility] DEPT Provider Note   CSN: [id number] Arrival date & time: 10/29/18  2106    History   Chief Complaint No chief complaint on file.   HPI [name] is a 68 y.o. female.     The history is provided by the patient and medical records. No language interpreter was used.   [name] is a 68 y.o. female who presents to the Emergency Department complaining of weakness. She presents to the emergency department by EMS complaining of weakness. She finished radiation treatment today and had lab work done. She was called back for platelet transfusion and received that and was sent home. Shortly after leaving she reports generalized weakness and malaise. She feels as if she can't move. She has associated emesis times one that was nonbloody. She then developed diarrhea with two black stools. She denies any fevers, dysuria. Symptoms are severe, constant, worsening. Past Medical History:  Diagnosis Date   Arthritis    Chronic pain    Hyperlipidemia    Hypertension    Low blood magnesium 10/04/2018   lung ca dx'd 06/2018   Stroke ([place])    06/19/2018 minor    Patient Active Problem List   Diagnosis Date Noted   Hypomagnesemia 09/24/2018   Small cell carcinoma of lower lobe of right lung ([place]) 08/28/2018   Encounter for antineoplastic chemotherapy 08/28/2018   Goals of care, counseling/discussion 08/28/2018   Right lower lobe lung mass    Acute CVA (cerebrovascular accident) ([place]) 06/21/2018   Acute blood loss anemia 06/21/2018   Upper GI bleed 06/19/2018   Psoriasis 12/14/2017   Knee pain, right 12/14/2017   BMI 32.0-32.9,adult 12/15/2011   Underinsured 12/15/2011   HYPERLIPIDEMIA 03/26/2007   Essential hypertension 03/26/2007    Past Surgical History:  Procedure Laterality Date   BIOPSY  06/19/2018   Procedure: BIOPSY;  Surgeon: Laurence Spates, MD;  Location: [facility];  Service: Endoscopy;;    BIOPSY  10/11/2018   Procedure: BIOPSY;  Surgeon: Laurence Spates, MD;  Location: [facility];  Service: Endoscopy;;   ENDARTERECTOMY Right 06/26/2018   Procedure: ENDARTERECTOMY CAROTID RIGHT;  Surgeon: Serafina Mitchell, MD;  Location: [facility];  Service: Vascular;  Laterality: Right;   ESOPHAGOGASTRODUODENOSCOPY (EGD) WITH PROPOFOL N/A 06/19/2018   Procedure: ESOPHAGOGASTRODUODENOSCOPY (EGD) WITH PROPOFOL;  Surgeon: Laurence Spates, MD;  Location: [facility];  Service: Endoscopy;  Laterality: N/A;   FLEXIBLE SIGMOIDOSCOPY N/A 10/11/2018   Procedure: FLEXIBLE SIGMOIDOSCOPY;  Surgeon: Laurence Spates, MD;  Location: [facility];  Service: Endoscopy;  Laterality: N/A;   IR IMAGING GUIDED PORT INSERTION  10/07/2018   PATCH ANGIOPLASTY Right 06/26/2018   Procedure: PATCH ANGIOPLASTY USING A XENOSURE 1cm x 6cm BIOLOGIC PATCH;  Surgeon: Serafina Mitchell, MD;  Location: [facility] OR;  Service: Vascular;  Laterality: Right;   VIDEO BRONCHOSCOPY WITH ENDOBRONCHIAL ULTRASOUND N/A 08/14/2018   Procedure: VIDEO BRONCHOSCOPY WITH ENDOBRONCHIAL ULTRASOUND;  Surgeon: Collene Gobble, MD;  Location: [facility] OR;  Service: Thoracic;  Laterality: N/A;     OB History   No obstetric history on file.      Home Medications    Prior to Admission medications   Medication Sig Start Date End Date Taking? Authorizing Provider  acetaminophen (TYLENOL) 650 MG CR tablet Take 650 mg by mouth every 8 (eight) hours as needed for pain.    [provider]  aspirin EC 325 MG EC tablet Take 1 tablet (325 mg total) by mouth daily. STOP 12/15 FOR [facility] AND  BIOPSY 12/18.Marland KitchenRESUME AFTER BIOPSY Patient not taking: Reported on 10/22/2018 06/27/18   Georgette Shell, MD  atorvastatin (LIPITOR) 80 MG tablet Take 1 tablet (80 mg total) by mouth daily at 6 PM. 08/06/18   Rutherford Guys, MD  chlorthalidone (HYGROTON) 25 MG tablet TAKE 1 TABLET BY MOUTH EVERY DAY 10/24/18   Rutherford Guys, MD  Ferrous Gluconate-C-Folic Acid (IRON-C PO)  Take 65 mg by mouth 3 (three) times a week.     [provider]  lidocaine-prilocaine (EMLA) cream Apply 1 application topically as needed. 10/08/18   Curt Bears, MD  lisinopril (PRINIVIL,ZESTRIL) 40 MG tablet TAKE 1 TABLET BY MOUTH EVERY DAY 10/24/18   Rutherford Guys, MD  magnesium oxide (MAG-OX) 400 MG tablet TAKE 1 TABLET BY MOUTH 3 TIMES A DAY 10/29/18   Heilingoetter, Cassandra L, PA-C  oxyCODONE-acetaminophen (PERCOCET/ROXICET) 5-325 MG tablet Take 1 tablet by mouth every 8 (eight) hours as needed for severe pain. Patient not taking: Reported on 10/22/2018 10/08/18   Curt Bears, MD  pantoprazole (PROTONIX) 40 MG tablet TAKE 1 TABLET (40 MG TOTAL) BY MOUTH 2 (TWO) TIMES DAILY BEFORE A MEAL. 10/16/18   Curt Bears, MD  prochlorperazine (COMPAZINE) 10 MG tablet Take 1 tablet (10 mg total) by mouth every 6 (six) hours as needed for nausea or vomiting. 10/15/18   Heilingoetter, Cassandra L, PA-C  sucralfate (CARAFATE) 1 GM/10ML suspension Take 10 mLs (1 g total) by mouth 4 (four) times daily -  with meals and at bedtime. 10/15/18   Gery Pray, MD    Family History Family History  Problem Relation Age of Onset   Cancer Mother        lung cancer   Diabetes Other    Hyperlipidemia Other    Hypertension Other    Cancer Other     Social History Social History   Tobacco Use   Smoking status: Former Smoker    Packs/day: 1.00    Years: 10.00    Pack years: 10.00    Types: Cigarettes    Last attempt to quit: 06/19/2018    Years since quitting: 0.3   Smokeless tobacco: Never Used   Tobacco comment: Currently using Nicorette Gum  Substance Use Topics   Alcohol use: Yes    Comment: wine 2 glasses a day    Drug use: Never    Comment: Hemp Oil     Allergies   Patient has no known allergies.   Review of Systems Review of Systems  All other systems reviewed and are negative.    Physical Exam Updated Vital Signs BP 109/75    Pulse 86    Temp (!)  97.4 F (36.3 C) (Oral)    Resp 14    Ht 5\' 5"  (1.651 m)    Wt 85.7 kg    SpO2 98%    BMI 31.45 kg/m   Physical Exam Vitals signs and nursing note reviewed.  Constitutional:      General: She is in acute distress.     Appearance: She is well-developed. She is ill-appearing.  HENT:     Head: Normocephalic and atraumatic.  Cardiovascular:     Rate and Rhythm: Normal rate and regular rhythm.  Pulmonary:     Effort: Pulmonary effort is normal. No respiratory distress.  Abdominal:     Palpations: Abdomen is soft.     Tenderness: There is no guarding or rebound.     Comments: Moderate generalized abdominal tenderness  Genitourinary:  Comments: Black stool in rectal vault Musculoskeletal:        General: No tenderness.  Skin:    General: Skin is warm and dry.     Coloration: Skin is pale.  Neurological:     Mental Status: She is alert and oriented to person, place, and time.  Psychiatric:        Behavior: Behavior normal.      ED Treatments / Results  Labs (all labs ordered are listed, but only abnormal results are displayed) Labs Reviewed  COMPREHENSIVE METABOLIC PANEL - Abnormal; Notable for the following components:      Result Value   Potassium 2.4 (*)    Glucose, Bld 156 (*)    BUN 33 (*)    Creatinine, Ser 2.31 (*)    Calcium 8.5 (*)    Total Protein 5.8 (*)    Albumin 3.4 (*)    AST 11 (*)    GFR calc non Af Amer 21 (*)    GFR calc Af Amer 25 (*)    All other components within normal limits  CBC WITH DIFFERENTIAL/PLATELET - Abnormal; Notable for the following components:   WBC 2.9 (*)    RBC 2.86 (*)    Hemoglobin 8.0 (*)    HCT 25.7 (*)    RDW 20.6 (*)    Platelets 48 (*)    Lymphs Abs 0.2 (*)    All other components within normal limits  POCT I-STAT EG7 - Abnormal; Notable for the following components:   pCO2, Ven 43.4 (*)    Acid-base deficit 3.0 (*)    Potassium 2.5 (*)    HCT 23.0 (*)    Hemoglobin 7.8 (*)    All other components within normal  limits  CULTURE, BLOOD (ROUTINE X 2)  CULTURE, BLOOD (ROUTINE X 2)  PROTIME-INR  LACTIC ACID, PLASMA  MAGNESIUM  URINALYSIS, ROUTINE W REFLEX MICROSCOPIC  LACTIC ACID, PLASMA  POC OCCULT BLOOD, ED  TYPE AND SCREEN  PREPARE RBC (CROSSMATCH)    EKG None  Radiology Ct Abdomen Pelvis Wo Contrast  Result Date: 10/29/2018 CLINICAL DATA:  Left-sided flank pain, history of lung carcinoma EXAM: CT ABDOMEN AND PELVIS WITHOUT CONTRAST TECHNIQUE: Multidetector CT imaging of the abdomen and pelvis was performed following the standard protocol without IV contrast. COMPARISON:  08/30/2018 FINDINGS: Lower chest: Lung bases demonstrate a right lower lobe mass lesion which now measures 2.9 cm in greatest dimension. This has decreased in size when compared with the prior PET-CT from 4.6 cm. This is consistent with the patient's given clinical history of lung carcinoma and recent treatment. Hepatobiliary: No focal liver abnormality is seen. No gallstones, gallbladder wall thickening, or biliary dilatation. Pancreas: Unremarkable. No pancreatic ductal dilatation or surrounding inflammatory changes. Spleen: Normal in size without focal abnormality. Adrenals/Urinary Tract: Adrenal glands are within normal limits. The kidneys are within normal limits. No renal calculi or obstructive changes are noted. The bladder is decompressed. Stomach/Bowel: Appendix is well visualized and within normal limits. Very mild pericolonic inflammatory changes are noted in the sigmoid colon. Small bowels within normal limits. Stomach is unremarkable. Vascular/Lymphatic: Aortic atherosclerosis. No enlarged abdominal or pelvic lymph nodes. Reproductive: Uterus and bilateral adnexa are unremarkable. Other: Mild free fluid is noted within the pelvis likely of a reactive nature. Musculoskeletal: Degenerative changes of lumbar spine are noted. IMPRESSION: Decrease in size in right lower lobe mass consistent with previous treatment. Mild  pericolonic inflammatory changes likely of an infectious etiology. No abscess or perforation is identified.  These changes are concentrated predominately within the sigmoid. Electronically Signed   By: Inez Catalina M.D.   On: 10/29/2018 23:28   Dg Chest Port 1 View  Result Date: 10/29/2018 CLINICAL DATA:  Hypertension and history of lung carcinoma EXAM: PORTABLE CHEST 1 VIEW COMPARISON:  10/11/2018 FINDINGS: Cardiac shadows within normal limits. Right-sided chest wall port is noted with catheter tip at the cavoatrial junction. Lungs are well aerated bilaterally. Persistent right lower lobe mass lesion is noted stable from the recent CT examination. Stable right paratracheal prominence is noted consistent with lymphadenopathy. No bony abnormality is noted. IMPRESSION: Stable lung mass on the right. No new focal abnormality is seen. Electronically Signed   By: Inez Catalina M.D.   On: 10/29/2018 21:45    Procedures Procedures (including critical care time) CRITICAL CARE Performed by: Quintella Reichert   Total critical care time: 35 minutes  Critical care time was exclusive of separately billable procedures and treating other patients.  Critical care was necessary to treat or prevent imminent or life-threatening deterioration.  Critical care was time spent personally by me on the following activities: development of treatment plan with patient and/or surrogate as well as nursing, discussions with consultants, evaluation of patient's response to treatment, examination of patient, obtaining history from patient or surrogate, ordering and performing treatments and interventions, ordering and review of laboratory studies, ordering and review of radiographic studies, pulse oximetry and re-evaluation of patient's condition.  Medications Ordered in ED Medications  pantoprazole (PROTONIX) 80 mg in sodium chloride 0.9 % 250 mL (0.32 mg/mL) infusion (8 mg/hr Intravenous New Bag/Given 10/29/18 2219)    pantoprazole (PROTONIX) injection 40 mg (has no administration in time range)  potassium chloride 10 mEq in 100 mL IVPB (10 mEq Intravenous New Bag/Given 10/29/18 2316)  piperacillin-tazobactam (ZOSYN) IVPB 3.375 g (has no administration in time range)  0.9 %  sodium chloride infusion (Manually program via Guardrails IV Fluids) ( Intravenous New Bag/Given 10/29/18 2216)  fentaNYL (SUBLIMAZE) injection 25 mcg (25 mcg Intravenous Given 10/29/18 2217)  pantoprazole (PROTONIX) 80 mg in sodium chloride 0.9 % 100 mL IVPB (0 mg Intravenous Stopped 10/29/18 2317)     Initial Impression / Assessment and Plan / ED Course  I have reviewed the triage vital signs and the nursing notes.  Pertinent labs & imaging results that were available during my care of the patient were reviewed by me and considered in my medical decision making (see chart for details).        Patient with small cell lung cancer here for evaluation of abdominal pain, black stools and hypotension. She is ill appearing on evaluation. Initial concern for possible G.I. bleed and she was started on Protonix drip, given IV fluids.  Hemoccult neg for blood.  CBC stable compared to priors.  Patient's hypotension improved after fluid bolus.  Blood transfusion cancelled for now.  CT abd demonstrates colitis - will treat with abx.   BMP significant for hypokalemia - will replace. Pt updated of findings of studies and recommendation for admission and she is in agreement with treatment plan.    Medicine consulted for admission.    Final Clinical Impressions(s) / ED Diagnoses   Final diagnoses:  None    ED Discharge Orders    None       Quintella Reichert, MD 10/29/18 2350

## 2018-10-29 NOTE — Telephone Encounter (Signed)
Pt notified to come back for platelets.

## 2018-10-29 NOTE — ED Notes (Signed)
Date and time results received: 10/29/18 2252 (use smartphrase ".now" to insert current time)  Test: K+ Critical Value: 2.5  Name of Provider Notified: Dr. Ayesha Rumpf  Orders Received? Or Actions Taken?: Orders Received - See Orders for details

## 2018-10-30 ENCOUNTER — Other Ambulatory Visit: Payer: Self-pay | Admitting: *Deleted

## 2018-10-30 ENCOUNTER — Encounter (HOSPITAL_COMMUNITY): Payer: Self-pay

## 2018-10-30 ENCOUNTER — Ambulatory Visit
Admission: RE | Admit: 2018-10-30 | Discharge: 2018-10-30 | Disposition: A | Discharge status: Home / Self Care | Payer: Medicare Other | Source: Ambulatory Visit | Attending: Radiation Oncology | Admitting: Radiation Oncology

## 2018-10-30 DIAGNOSIS — I959 Hypotension, unspecified: Secondary | ICD-10-CM | POA: Diagnosis present

## 2018-10-30 DIAGNOSIS — C3431 Malignant neoplasm of lower lobe, right bronchus or lung: Secondary | ICD-10-CM

## 2018-10-30 DIAGNOSIS — D649 Anemia, unspecified: Secondary | ICD-10-CM

## 2018-10-30 DIAGNOSIS — R112 Nausea with vomiting, unspecified: Secondary | ICD-10-CM | POA: Diagnosis present

## 2018-10-30 DIAGNOSIS — I10 Essential (primary) hypertension: Secondary | ICD-10-CM | POA: Diagnosis not present

## 2018-10-30 DIAGNOSIS — D61818 Other pancytopenia: Secondary | ICD-10-CM | POA: Diagnosis not present

## 2018-10-30 DIAGNOSIS — A419 Sepsis, unspecified organism: Secondary | ICD-10-CM | POA: Diagnosis present

## 2018-10-30 DIAGNOSIS — E876 Hypokalemia: Secondary | ICD-10-CM

## 2018-10-30 DIAGNOSIS — N179 Acute kidney failure, unspecified: Principal | ICD-10-CM

## 2018-10-30 DIAGNOSIS — R197 Diarrhea, unspecified: Secondary | ICD-10-CM

## 2018-10-30 DIAGNOSIS — N183 Chronic kidney disease, stage 3 (moderate): Secondary | ICD-10-CM

## 2018-10-30 DIAGNOSIS — K922 Gastrointestinal hemorrhage, unspecified: Secondary | ICD-10-CM | POA: Diagnosis present

## 2018-10-30 DIAGNOSIS — K279 Peptic ulcer, site unspecified, unspecified as acute or chronic, without hemorrhage or perforation: Secondary | ICD-10-CM | POA: Diagnosis present

## 2018-10-30 LAB — BASIC METABOLIC PANEL
Anion gap: 5 (ref 5–15)
BUN: 30 mg/dL — ABNORMAL HIGH (ref 8–23)
CO2: 23 mmol/L (ref 22–32)
Calcium: 8.2 mg/dL — ABNORMAL LOW (ref 8.9–10.3)
Chloride: 109 mmol/L (ref 98–111)
Creatinine, Ser: 2.09 mg/dL — ABNORMAL HIGH (ref 0.44–1.00)
GFR calc Af Amer: 28 mL/min — ABNORMAL LOW (ref >60)
GFR calc non Af Amer: 24 mL/min — ABNORMAL LOW (ref >60)
Glucose, Bld: 94 mg/dL (ref 70–99)
Potassium: 4.9 mmol/L (ref 3.5–5.1)
Sodium: 137 mmol/L (ref 135–145)

## 2018-10-30 LAB — CBC
HCT: 23.7 % — ABNORMAL LOW (ref 36.0–46.0)
HCT: 22.4 % — ABNORMAL LOW (ref 36.0–46.0)
HCT: 23 % — ABNORMAL LOW (ref 36.0–46.0)
Hemoglobin: 7.5 g/dL — ABNORMAL LOW (ref 12.0–15.0)
Hemoglobin: 6.8 g/dL — CL (ref 12.0–15.0)
Hemoglobin: 7.3 g/dL — ABNORMAL LOW (ref 12.0–15.0)
MCH: 28.3 pg (ref 26.0–34.0)
MCH: 27.6 pg (ref 26.0–34.0)
MCH: 28.1 pg (ref 26.0–34.0)
MCHC: 31.6 g/dL (ref 30.0–36.0)
MCHC: 30.4 g/dL (ref 30.0–36.0)
MCHC: 31.7 g/dL (ref 30.0–36.0)
MCV: 89.4 fL (ref 80.0–100.0)
MCV: 91.1 fL (ref 80.0–100.0)
MCV: 88.5 fL (ref 80.0–100.0)
Platelets: 47 10*3/uL — ABNORMAL LOW (ref 150–400)
Platelets: 44 10*3/uL — ABNORMAL LOW (ref 150–400)
Platelets: 39 10*3/uL — ABNORMAL LOW (ref 150–400)
RBC: 2.65 MIL/uL — ABNORMAL LOW (ref 3.87–5.11)
RBC: 2.46 MIL/uL — ABNORMAL LOW (ref 3.87–5.11)
RBC: 2.6 MIL/uL — ABNORMAL LOW (ref 3.87–5.11)
RDW: 20.6 % — ABNORMAL HIGH (ref 11.5–15.5)
RDW: 20.6 % — ABNORMAL HIGH (ref 11.5–15.5)
RDW: 20 % — ABNORMAL HIGH (ref 11.5–15.5)
WBC: 3.4 10*3/uL — ABNORMAL LOW (ref 4.0–10.5)
WBC: 2.6 10*3/uL — ABNORMAL LOW (ref 4.0–10.5)
WBC: 3 10*3/uL — ABNORMAL LOW (ref 4.0–10.5)
nRBC: 0 % (ref 0.0–0.2)
nRBC: 0 % (ref 0.0–0.2)
nRBC: 0 % (ref 0.0–0.2)

## 2018-10-30 LAB — URINALYSIS, ROUTINE W REFLEX MICROSCOPIC
Bilirubin Urine: NEGATIVE
Glucose, UA: NEGATIVE mg/dL
Hgb urine dipstick: NEGATIVE
Ketones, ur: NEGATIVE mg/dL
Leukocytes,Ua: NEGATIVE
Nitrite: NEGATIVE
Protein, ur: NEGATIVE mg/dL
Specific Gravity, Urine: 1.012 (ref 1.005–1.030)
pH: 5 (ref 5.0–8.0)

## 2018-10-30 LAB — BPAM PLATELET PHERESIS
Blood Product Expiration Date: 202004152359
ISSUE DATE / TIME: 202004141615
Unit Type and Rh: 6200

## 2018-10-30 LAB — PREPARE PLATELET PHERESIS: Unit division: 0

## 2018-10-30 LAB — APTT: aPTT: 25 seconds (ref 24–36)

## 2018-10-30 LAB — GLUCOSE, CAPILLARY: Glucose-Capillary: 89 mg/dL (ref 70–99)

## 2018-10-30 LAB — PREPARE RBC (CROSSMATCH)

## 2018-10-30 LAB — LACTIC ACID, PLASMA: Lactic Acid, Venous: 0.9 mmol/L (ref 0.5–1.9)

## 2018-10-30 LAB — PROCALCITONIN: Procalcitonin: 0.24 ng/mL

## 2018-10-30 MED ORDER — HYDROXYZINE HCL 10 MG PO TABS
10.0000 mg | ORAL_TABLET | Freq: Three times a day (TID) | ORAL | Status: DC | PRN
  Administered 2018-11-02: 10 mg via ORAL
  Filled 2018-10-30 (×3): qty 1

## 2018-10-30 MED ORDER — LIDOCAINE-PRILOCAINE 2.5-2.5 % EX CREA: 1.0000 "application " | TOPICAL_CREAM | CUTANEOUS | Status: DC | PRN

## 2018-10-30 MED ORDER — SODIUM CHLORIDE 0.9 % IV BOLUS
1000.0000 mL | Freq: Once | INTRAVENOUS | Status: AC
  Administered 2018-10-30: 1000 mL via INTRAVENOUS

## 2018-10-30 MED ORDER — ACETAMINOPHEN 325 MG PO TABS
650.0000 mg | ORAL_TABLET | Freq: Four times a day (QID) | ORAL | Status: DC | PRN
  Administered 2018-10-30 – 2018-10-31 (×2): 650 mg via ORAL
  Filled 2018-10-30 (×2): qty 2

## 2018-10-30 MED ORDER — SUCRALFATE 1 GM/10ML PO SUSP
1.0000 g | Freq: Three times a day (TID) | ORAL | Status: DC
  Administered 2018-10-30 – 2018-11-03 (×10): 1 g via ORAL
  Filled 2018-10-30 (×12): qty 10

## 2018-10-30 MED ORDER — SODIUM CHLORIDE 0.9% FLUSH
10.0000 mL | INTRAVENOUS | Status: DC | PRN
  Administered 2018-11-03: 10 mL
  Filled 2018-10-30: qty 40

## 2018-10-30 MED ORDER — PROCHLORPERAZINE MALEATE 10 MG PO TABS: 10.0000 mg | ORAL_TABLET | Freq: Four times a day (QID) | ORAL | 1 refills | Status: DC | PRN

## 2018-10-30 MED ORDER — ACETAMINOPHEN 650 MG RE SUPP: 650.0000 mg | Freq: Four times a day (QID) | RECTAL | Status: DC | PRN

## 2018-10-30 MED ORDER — OXYCODONE-ACETAMINOPHEN 5-325 MG PO TABS
1.0000 | ORAL_TABLET | Freq: Three times a day (TID) | ORAL | Status: DC | PRN
  Administered 2018-10-30 – 2018-11-01 (×5): 1 via ORAL
  Filled 2018-10-30 (×5): qty 1

## 2018-10-30 MED ORDER — FERROUS SULFATE 325 (65 FE) MG PO TABS
325.0000 mg | ORAL_TABLET | Freq: Every day | ORAL | Status: DC
  Administered 2018-10-30 – 2018-11-03 (×4): 325 mg via ORAL
  Filled 2018-10-30 (×4): qty 1

## 2018-10-30 MED ORDER — SODIUM CHLORIDE 0.9 % IV SOLN
INTRAVENOUS | Status: DC
  Administered 2018-10-30 – 2018-11-03 (×11): via INTRAVENOUS

## 2018-10-30 MED ORDER — ONDANSETRON HCL 4 MG PO TABS
4.0000 mg | ORAL_TABLET | ORAL | Status: DC | PRN
  Administered 2018-10-30 – 2018-11-01 (×7): 4 mg via ORAL
  Filled 2018-10-30 (×7): qty 1

## 2018-10-30 MED ORDER — PIPERACILLIN-TAZOBACTAM 3.375 G IVPB
3.3750 g | Freq: Three times a day (TID) | INTRAVENOUS | Status: DC
  Administered 2018-10-30 – 2018-11-02 (×10): 3.375 g via INTRAVENOUS
  Filled 2018-10-30 (×11): qty 50

## 2018-10-30 MED ORDER — ENSURE ENLIVE PO LIQD
237.0000 mL | Freq: Two times a day (BID) | ORAL | Status: DC
  Administered 2018-10-30 (×2): 237 mL via ORAL

## 2018-10-30 MED ORDER — ATORVASTATIN CALCIUM 40 MG PO TABS
80.0000 mg | ORAL_TABLET | Freq: Every day | ORAL | Status: DC
  Administered 2018-10-30 – 2018-11-02 (×3): 80 mg via ORAL
  Filled 2018-10-30 (×4): qty 2

## 2018-10-30 MED ORDER — POTASSIUM CHLORIDE CRYS ER 20 MEQ PO TBCR
40.0000 meq | EXTENDED_RELEASE_TABLET | Freq: Once | ORAL | Status: AC
  Administered 2018-10-30: 40 meq via ORAL
  Filled 2018-10-30: qty 2

## 2018-10-30 MED ORDER — SODIUM CHLORIDE 0.9% IV SOLUTION
Freq: Once | INTRAVENOUS | Status: AC
  Administered 2018-10-30: 11:00:00 via INTRAVENOUS

## 2018-10-30 MED ORDER — MAGNESIUM OXIDE 400 (241.3 MG) MG PO TABS
400.0000 mg | ORAL_TABLET | Freq: Four times a day (QID) | ORAL | Status: DC
  Administered 2018-10-30 – 2018-11-02 (×12): 400 mg via ORAL
  Filled 2018-10-30 (×13): qty 1

## 2018-10-30 NOTE — Progress Notes (Signed)
Pharmacy Antibiotic Note  [name] is a 68 y.o. female admitted on 10/29/2018 with possible colitis.  Pharmacy has been consulted for zosyn dosing.  Plan: Zosyn 3.375g IV q8h (4 hour infusion).  F/u scr/cultures  Height: 5\' 5"  (165.1 cm) Weight: 189 lb (85.7 kg) IBW/kg (Calculated) : 57  Temp (24hrs), Avg:97.7 F (36.5 C), Min:97.4 F (36.3 C), Max:98 F (36.7 C)  Recent Labs  Lab 10/29/18 1411 10/29/18 2152  WBC 2.6* 2.9*  CREATININE 2.25* 2.31*  LATICACIDVEN  --  1.2    Estimated Creatinine Clearance: 26.3 mL/min (A) (by C-G formula based on SCr of 2.31 mg/dL (H)).    No Known Allergies  Antimicrobials this admission: 4/14 zosyn >>    >>   Dose adjustments this admission:   Microbiology results:  BCx:   UCx:   Sputum:    MRSA PCR:  Thank you for allowing pharmacy to be a part of this patient's care.  Dorrene German 10/30/2018 12:28 AM

## 2018-10-30 NOTE — H&P (Addendum)
History and Physical    [name] ATF:[id number] DOB: 12-Nov-1950 DOA: 10/29/2018  Referring MD/NP/PA:   PCP: Rutherford Guys, MD   Patient coming from:  The patient is coming from home.  At baseline, pt is independent for most of ADL.        Chief Complaint: Generalized weakness, nausea, vomiting, diarrhea, black stool.  HPI: [name] is a 68 y.o. female with medical history significant of PUD, hypertension, hyperlipidemia, stroke, GERD, small cell lung cancer on radiation and chemotherapy, iron deficiency anemia, upper GI bleeding, CKD-3, who presents with generalized weakness, nausea, vomiting, diarrhea and black stool.  Patient states that she has been having generalized weakness, malaise in the past several days, which has been progressively getting worse.  She also reports nausea and vomited once.  She has had 2 loose stool bowel movement today.  She denies abdominal pain, but on physical examination, she has mild abdominal tenderness in the central abdomen.  She states that she has had 2 times of black stool bowel movement. patient denies cough, chest pain, shortness of breath, runny nose or sore throat.  No risk exposure to COVID-19.  No symptoms of UTI or unilateral weakness.  Initially patient was hypotensive with blood pressure 87/64, which improved to 109/75 after received about 600 cc normal saline.  EGD by Dr. Oletta Lamas on 06/19/18 showed: 1. Ulcer of esophagus without bleeding2.  2  Gastric ulcer, unspecified as acute or chronic, without hemorrhage or Perforation 3. Duodenal ulcer, unspecified as acute or chronic, without hemorrhage or perforation  Sigmoidoscopy by Dr. Oletta Lamas on 10/11/2018 showed: -rectal polyps and hemorrhoid.  ED Course: pt was found to have hemoglobin dropped from 8.9 on 10/22/2018 7.8, pancytopenia (WBC 2.9, platelet 48, hemoglobin 7.8), negative FOBT, lactic acid 1.2, INR 0.9, potassium 2.4, magnesium 1.8, worsening renal function,  temperature 97.4, chest x-ray showed stable right lung mass without infiltration.  CT abdomen/pelvis that showed mild pericolonic inflammatory change. Pt is placed on telemetry bed for observation.  Review of Systems:   General: no fevers, chills, no body weight gain, has fatigue HEENT: no blurry vision, hearing changes or sore throat Respiratory: no dyspnea, coughing, wheezing CV: no chest pain, no palpitations GI: has nausea, vomiting, abdominal pain, diarrhea, black stool, no constipation GU: no dysuria, burning on urination, increased urinary frequency, hematuria  Ext: no leg edema Neuro: no unilateral weakness, numbness, or tingling, no vision change or hearing loss Skin: no rash, no skin tear. MSK: No muscle spasm, no deformity, no limitation of range of movement in spin Heme: No easy bruising.  Travel history: No recent long distant travel.  Allergy: No Known Allergies  Past Medical History:  Diagnosis Date  . Arthritis   . Chronic pain   . Hyperlipidemia   . Hypertension   . Low blood magnesium 10/04/2018  . lung ca dx'd 06/2018  . Stroke ([place])    06/19/2018 minor    Past Surgical History:  Procedure Laterality Date  . BIOPSY  06/19/2018   Procedure: BIOPSY;  Surgeon: Laurence Spates, MD;  Location: [facility];  Service: Endoscopy;;  . BIOPSY  10/11/2018   Procedure: BIOPSY;  Surgeon: Laurence Spates, MD;  Location: [facility];  Service: Endoscopy;;  . ENDARTERECTOMY Right 06/26/2018   Procedure: ENDARTERECTOMY CAROTID RIGHT;  Surgeon: Serafina Mitchell, MD;  Location[facility] OR;  Service: Vascular;  Laterality: Right;  . ESOPHAGOGASTRODUODENOSCOPY (EGD) WITH PROPOFOL N/A 06/19/2018   Procedure: ESOPHAGOGASTRODUODENOSCOPY (EGD) WITH PROPOFOL;  Surgeon: Laurence Spates, MD;  Location: [facility];  Service: Endoscopy;  Laterality: N/A;  . FLEXIBLE SIGMOIDOSCOPY N/A 10/11/2018   Procedure: FLEXIBLE SIGMOIDOSCOPY;  Surgeon: Laurence Spates, MD;  Location: [facility];  Service:  Endoscopy;  Laterality: N/A;  . IR IMAGING GUIDED PORT INSERTION  10/07/2018  . PATCH ANGIOPLASTY Right 06/26/2018   Procedure: PATCH ANGIOPLASTY USING A XENOSURE 1cm x 6cm BIOLOGIC PATCH;  Surgeon: Serafina Mitchell, MD;  Location: [facility];  Service: Vascular;  Laterality: Right;  Marland Kitchen VIDEO BRONCHOSCOPY WITH ENDOBRONCHIAL ULTRASOUND N/A 08/14/2018   Procedure: VIDEO BRONCHOSCOPY WITH ENDOBRONCHIAL ULTRASOUND;  Surgeon: Collene Gobble, MD;  Location: [facility];  Service: Thoracic;  Laterality: N/A;    Social History:  reports that she quit smoking about 4 months ago. Her smoking use included cigarettes. She has a 10.00 pack-year smoking history. She has never used smokeless tobacco. She reports current alcohol use. She reports that she does not use drugs.  Family History:  Family History  Problem Relation Age of Onset  . Cancer Mother        lung cancer  . Diabetes Other   . Hyperlipidemia Other   . Hypertension Other   . Cancer Other      Prior to Admission medications   Medication Sig Start Date End Date Taking? Authorizing Provider  acetaminophen (TYLENOL) 650 MG CR tablet Take 650 mg by mouth every 8 (eight) hours as needed for pain.    [provider]  aspirin EC 325 MG EC tablet Take 1 tablet (325 mg total) by mouth daily. STOP 12/15 FOR BRONCH AND BIOPSY 12/18.Marland KitchenRESUME AFTER BIOPSY Patient not taking: Reported on 10/22/2018 06/27/18   Georgette Shell, MD  atorvastatin (LIPITOR) 80 MG tablet Take 1 tablet (80 mg total) by mouth daily at 6 PM. 08/06/18   Rutherford Guys, MD  chlorthalidone (HYGROTON) 25 MG tablet TAKE 1 TABLET BY MOUTH EVERY DAY 10/24/18   Rutherford Guys, MD  Ferrous Gluconate-C-Folic Acid (IRON-C PO) Take 65 mg by mouth 3 (three) times a week.     [provider]  lidocaine-prilocaine (EMLA) cream Apply 1 application topically as needed. 10/08/18   Curt Bears, MD  lisinopril (PRINIVIL,ZESTRIL) 40 MG tablet TAKE 1 TABLET BY MOUTH EVERY DAY 10/24/18    Rutherford Guys, MD  magnesium oxide (MAG-OX) 400 MG tablet TAKE 1 TABLET BY MOUTH 3 TIMES A DAY 10/29/18   Heilingoetter, Cassandra L, PA-C  oxyCODONE-acetaminophen (PERCOCET/ROXICET) 5-325 MG tablet Take 1 tablet by mouth every 8 (eight) hours as needed for severe pain. Patient not taking: Reported on 10/22/2018 10/08/18   Curt Bears, MD  pantoprazole (PROTONIX) 40 MG tablet TAKE 1 TABLET (40 MG TOTAL) BY MOUTH 2 (TWO) TIMES DAILY BEFORE A MEAL. 10/16/18   Curt Bears, MD  prochlorperazine (COMPAZINE) 10 MG tablet Take 1 tablet (10 mg total) by mouth every 6 (six) hours as needed for nausea or vomiting. 10/15/18   Heilingoetter, Cassandra L, PA-C  sucralfate (CARAFATE) 1 GM/10ML suspension Take 10 mLs (1 g total) by mouth 4 (four) times daily -  with meals and at bedtime. 10/15/18   Gery Pray, MD    Physical Exam: Vitals:   10/29/18 2121 10/29/18 2300 10/29/18 2330 10/30/18 0205  BP:  116/81 109/75 112/76  Pulse:  93 86 85  Resp:  15 14 18   Temp:    (!) 97.4 F (36.3 C)  TempSrc:    Oral  SpO2:  100% 98% 100%  Weight: 85.7 kg   87 kg  Height: 5\' 5"  (1.651 m)      General: Not in acute distress. Pale looking. HEENT:       Eyes: PERRL, EOMI, no scleral icterus.       ENT: No discharge from the ears and nose, no pharynx injection, no tonsillar enlargement.        Neck: No JVD, no bruit, no mass felt. Heme: No neck lymph node enlargement. Cardiac: S1/S2, RRR, No murmurs, No gallops or rubs. Respiratory:  No rales, wheezing, rhonchi or rubs. GI: Soft, nondistended, has mild tenderness in central abdomen, no rebound pain, no organomegaly, BS present. GU: No hematuria Ext: No pitting leg edema bilaterally. 2+DP/PT pulse bilaterally. Musculoskeletal: No joint deformities, No joint redness or warmth, no limitation of ROM in spin. Skin: No rashes.  Neuro: Alert, oriented X3, cranial nerves II-XII grossly intact, moves all extremities normally.  Psych: Patient is not psychotic, no  suicidal or hemocidal ideation.  Labs on Admission: I have personally reviewed following labs and imaging studies  CBC: Recent Labs  Lab 10/29/18 1411 10/29/18 2152 10/29/18 2207 10/30/18 0049  WBC 2.6* 2.9*  --  3.4*  NEUTROABS 1.8 2.4  --   --   HGB 7.5* 8.0* 7.8* 7.5*  HCT 23.7* 25.7* 23.0* 23.7*  MCV 87.1 89.9  --  89.4  PLT 7* 48*  --  47*   Basic Metabolic Panel: Recent Labs  Lab 10/29/18 1411 10/29/18 2152 10/29/18 2207 10/29/18 2255  NA 139 137 139  --   K 4.0 2.4* 2.5*  --   CL 104 105  --   --   CO2 25 23  --   --   GLUCOSE 121* 156*  --   --   BUN 31* 33*  --   --   CREATININE 2.25* 2.31*  --   --   CALCIUM 9.1 8.5*  --   --   MG 1.9  --   --  1.8   GFR: Estimated Creatinine Clearance: 26.4 mL/min (A) (by C-G formula based on SCr of 2.31 mg/dL (H)). Liver Function Tests: Recent Labs  Lab 10/29/18 1411 10/29/18 2152  AST 11* 11*  ALT 7 9  ALKPHOS 91 83  BILITOT 0.2* 0.5  PROT 6.3* 5.8*  ALBUMIN 3.5 3.4*   No results for input(s): LIPASE, AMYLASE in the last 168 hours. No results for input(s): AMMONIA in the last 168 hours. Coagulation Profile: Recent Labs  Lab 10/29/18 2152  INR 0.9   Cardiac Enzymes: No results for input(s): CKTOTAL, CKMB, CKMBINDEX, TROPONINI in the last 168 hours. BNP (last 3 results) No results for input(s): PROBNP in the last 8760 hours. HbA1C: No results for input(s): HGBA1C in the last 72 hours. CBG: No results for input(s): GLUCAP in the last 168 hours. Lipid Profile: No results for input(s): CHOL, HDL, LDLCALC, TRIG, CHOLHDL, LDLDIRECT in the last 72 hours. Thyroid Function Tests: No results for input(s): TSH, T4TOTAL, FREET4, T3FREE, THYROIDAB in the last 72 hours. Anemia Panel: No results for input(s): VITAMINB12, FOLATE, FERRITIN, TIBC, IRON, RETICCTPCT in the last 72 hours. Urine analysis:    Component Value Date/Time   COLORURINE YELLOW 06/21/2018 [place] 06/21/2018 1214   LABSPEC  1.010 06/21/2018 1214   PHURINE 5.5 06/21/2018 [place] 06/21/2018 [place] 06/21/2018 1214   BILIRUBINUR NEGATIVE 06/21/2018 1214   BILIRUBINUR negative 06/21/2018 [place] 06/21/2018 1214   PROTEINUR NEGATIVE 06/21/2018 1214   UROBILINOGEN  0.2 06/21/2018 1057   NITRITE NEGATIVE 06/21/2018 1214   LEUKOCYTESUR NEGATIVE 06/21/2018 1214   Sepsis Labs: @LABRCNTIP (procalcitonin:4,lacticidven:4) ) Recent Results (from the past 240 hour(s))  Culture, blood (routine x 2)     Status: None (Preliminary result)   Collection Time: 10/29/18  9:52 PM  Result Value Ref Range Status   Specimen Description   Final    BLOOD PORTA CATH Performed at [facility], [place] [address], [place], [place] [address]    Special Requests   Final    BOTTLES DRAWN AEROBIC AND ANAEROBIC Blood Culture adequate volume Performed at [facility] [address] [place], [place] [address]    Culture PENDING  Incomplete   Report Status PENDING  Incomplete     Radiological Exams on Admission: Ct Abdomen Pelvis Wo Contrast  Result Date: 10/29/2018 CLINICAL DATA:  Left-sided flank pain, history of lung carcinoma EXAM: CT ABDOMEN AND PELVIS WITHOUT CONTRAST TECHNIQUE: Multidetector CT imaging of the abdomen and pelvis was performed following the standard protocol without IV contrast. COMPARISON:  08/30/2018 FINDINGS: Lower chest: Lung bases demonstrate a right lower lobe mass lesion which now measures 2.9 cm in greatest dimension. This has decreased in size when compared with the prior PET-CT from 4.6 cm. This is consistent with the patient's given clinical history of lung carcinoma and recent treatment. Hepatobiliary: No focal liver abnormality is seen. No gallstones, gallbladder wall thickening, or biliary dilatation. Pancreas: Unremarkable. No pancreatic ductal dilatation or surrounding inflammatory changes. Spleen: Normal in size without focal  abnormality. Adrenals/Urinary Tract: Adrenal glands are within normal limits. The kidneys are within normal limits. No renal calculi or obstructive changes are noted. The bladder is decompressed. Stomach/Bowel: Appendix is well visualized and within normal limits. Very mild pericolonic inflammatory changes are noted in the sigmoid colon. Small bowels within normal limits. Stomach is unremarkable. Vascular/Lymphatic: Aortic atherosclerosis. No enlarged abdominal or pelvic lymph nodes. Reproductive: Uterus and bilateral adnexa are unremarkable. Other: Mild free fluid is noted within the pelvis likely of a reactive nature. Musculoskeletal: Degenerative changes of lumbar spine are noted. IMPRESSION: Decrease in size in right lower lobe mass consistent with previous treatment. Mild pericolonic inflammatory changes likely of an infectious etiology. No abscess or perforation is identified. These changes are concentrated predominately within the sigmoid. Electronically Signed   By: Inez Catalina M.D.   On: 10/29/2018 23:28   Dg Chest Port 1 View  Result Date: 10/29/2018 CLINICAL DATA:  Hypertension and history of lung carcinoma EXAM: PORTABLE CHEST 1 VIEW COMPARISON:  10/11/2018 FINDINGS: Cardiac shadows within normal limits. Right-sided chest wall port is noted with catheter tip at the cavoatrial junction. Lungs are well aerated bilaterally. Persistent right lower lobe mass lesion is noted stable from the recent CT examination. Stable right paratracheal prominence is noted consistent with lymphadenopathy. No bony abnormality is noted. IMPRESSION: Stable lung mass on the right. No new focal abnormality is seen. Electronically Signed   By: Inez Catalina M.D.   On: 10/29/2018 21:45     EKG: Independently reviewed.  Sinus rhythm, QTC 514, LAE, nonspecific T wave change.  Assessment/Plan Principal Problem:   Symptomatic anemia Active Problems:   HLD (hyperlipidemia)   Essential hypertension   Small cell carcinoma  of lower lobe of right lung (HCC)   GIB (gastrointestinal bleeding)   Hypotension   Hypokalemia   Acute renal failure superimposed on stage 3 chronic kidney disease (HCC)   Pancytopenia (HCC)   Nausea vomiting and diarrhea  PUD (peptic ulcer disease)   Sepsis ([place])   Symptomatic anemia and pancytopenia: Hgb dropped from recent 8.9-->7.8.  Likely due to upper GI bleeding in the setting of her chemotherapy - Placed on telemetry bed for observation - 1 unit of blood was ordered by EDP - f/u CBC q6h -Continue iron supplement  GIB (gastrointestinal bleeding): pt has hx of upper GI bleeding due to peptic ulcer disease.  Patient is taking aspirin currently. - transfuse 1 units of blood - NPO  - IVF: 1L NS bolus, then at 125 mL/hr - Start IV pantoprazole gtt - Zofran IV for nausea - Hold ASA - Avoid NSAIDs and SQ heparin - Maintain IV access (2 large bore IVs if possible). - Monitor closely and follow q6h cbc, transfuse as necessary, if Hgb<7.0 - LaB: INR, PTT and type screen - May need to call GI in AM  Nausea vomiting and diarrhea and suspected sepsis: CT abdomen/pelvis showed mild pericolonic inflammatory change, indicating possible colitis.  Patient meets criteria for sepsis with WBC less than 4, hypotension, hypothermia with temperature 97.4.  Blood pressure responded to IV fluid, currently blood pressure is 109/75. -IV Zosyn was started in ED, will continue -will get Procalcitonin and trend lactic acid levels per sepsis protocol. -IVF: total of 1.6 L of NS bolus in ED, followed by 125 cc/h   Hypotension: -see above  HLD (hyperlipidemia): -lipitor  Essential hypertension: -hold Bp med due to hypotension   Small cell carcinoma of lower lobe of right lung (HCC)  Hypokalemia: K= 2.4 on admission. - Repleted - Check Mg level-->1.8  Acute renal failure superimposed on stage 3 chronic kidney disease ([place]): Baseline Cre is 1.3-1.6, pt's Cre is 2.31 and BUN 33 on admission.  Likely due to dehydration and continuation of ACEI, diuretics - IVF as above - Follow up renal function by BMP - Hold lisinopril and Hygroton  PUD (peptic ulcer disease): -on IV protonix now -continue carafat      DVT ppx: SCD Code Status: Full code Family Communication: None at bed side.     Disposition Plan:  Anticipate discharge back to previous home environment Consults called:  none Admission status: Obs / tele      Date of Service 10/30/2018    [facility]   If 7PM-7AM, please contact night-coverage [email or web address] Password [facility] 10/30/2018, 3:18 AM

## 2018-10-30 NOTE — Progress Notes (Signed)
HEMATOLOGY-ONCOLOGY PROGRESS NOTE  SUBJECTIVE: Ms. Slider is a 68 year old female followed by Dr. Julien Nordmann for limited stage (T2b, N2, M0) small cell lung cancer diagnosed in January 2020.  She has been receiving systemic chemotherapy concurrent with radiation.  Chemotherapy consist of cisplatin 80 mg meter squared on day 1 and etoposide 100 mg meter squared on days 1, 2, and 3 every 3 weeks.  She is status post 3 cycles of her chemotherapy.  Last cycle was started on 10/16/2018.  The patient received Neulasta with her chemotherapy.  The patient presents to the emergency room via EMS for weakness.  The patient also had vomiting and to black stools.  Initially, there was concern for GI bleed and she was started on Protonix drip and given IV fluids.  Hemoccult was negative for blood.  She had a CT of the abdomen which showed colitis.  The patient reports that she feels very tired.  Reports nausea.  Has not vomited since admission.  Has intermittent diarrhea secondary to magnesium.  Denies chest pain and shortness of breath.  Denies epistaxis, hemoptysis, hematemesis, hematuria, hematochezia, melena. CBC from this morning showed a hemoglobin of 6.8.  Platelet count is low at 44,000.  She received a platelet transfusion on 10/29/2018 at the cancer center for a platelet count of 7000.  Total white blood cell count is low today at 2.6.  Differential was not checked this morning but ANC was normal upon admission at 2.4.    Small cell carcinoma of lower lobe of right lung ([place])   08/28/2018 Initial Diagnosis    Small cell carcinoma of lower lobe of right lung ([place])    09/03/2018 -  Chemotherapy    The patient had palonosetron (ALOXI) injection 0.25 mg, 0.25 mg, Intravenous,  Once, 3 of 6 cycles Administration: 0.25 mg (09/03/2018), 0.25 mg (09/25/2018), 0.25 mg (10/16/2018) pegfilgrastim (NEULASTA ONPRO KIT) injection 6 mg, 6 mg, Subcutaneous, Once, 2 of 5 cycles Administration: 6 mg (09/27/2018), 6 mg  (10/18/2018) CISplatin (PLATINOL) 165 mg in sodium chloride 0.9 % 500 mL chemo infusion, 80 mg/m2 = 165 mg, Intravenous,  Once, 3 of 6 cycles Administration: 165 mg (09/03/2018), 165 mg (09/25/2018), 165 mg (10/16/2018) etoposide (VEPESID) 200 mg in sodium chloride 0.9 % 500 mL chemo infusion, 97 mg/m2 = 210 mg, Intravenous,  Once, 3 of 6 cycles Administration: 200 mg (09/03/2018), 200 mg (09/04/2018), 200 mg (09/05/2018), 200 mg (09/25/2018), 200 mg (09/26/2018), 200 mg (09/27/2018), 200 mg (10/16/2018), 200 mg (10/17/2018), 200 mg (10/18/2018) fosaprepitant (EMEND) 150 mg, dexamethasone (DECADRON) 12 mg in sodium chloride 0.9 % 145 mL IVPB, , Intravenous,  Once, 3 of 6 cycles Administration:  (09/03/2018),  (09/25/2018),  (10/16/2018)  for chemotherapy treatment.      REVIEW OF SYSTEMS:   Constitutional: Reports fatigue.  Denies fevers and chills. Eyes: Denies blurriness of vision Ears, nose, mouth, throat, and face: Denies mucositis or sore throat Respiratory: Denies cough, dyspnea or wheezes.  Denies hemoptysis. Cardiovascular: Denies palpitation, chest discomfort Gastrointestinal: Reports nausea.  No vomiting since admission.  Has intermittent diarrhea secondary to magnesium. Skin: Denies abnormal skin rashes Lymphatics: Denies new lymphadenopathy or easy bruising Neurological:Denies numbness, tingling or new weaknesses Behavioral/Psych: Mood is stable, no new changes  Extremities: No lower extremity edema All other systems were reviewed with the patient and are negative.  I have reviewed the past medical history, past surgical history, social history and family history with the patient and they are unchanged from previous note.   PHYSICAL EXAMINATION:  Vitals:   10/30/18 0205 10/30/18 0450  BP: 112/76 106/66  Pulse: 85 85  Resp: 18 17  Temp: (!) 97.4 F (36.3 C) 97.8 F (36.6 C)  SpO2: 100% 95%   Filed Weights   10/29/18 2121 10/30/18 0205  Weight: 189 lb (85.7 kg) 191 lb 12.8 oz (87 kg)     GENERAL:alert, no distress and comfortable SKIN: skin color, texture, turgor are normal, no rashes or significant lesions EYES: normal, Conjunctiva are pink and non-injected, sclera clear OROPHARYNX:no exudate, no erythema and lips, buccal mucosa, and tongue normal  NECK: supple, thyroid normal size, non-tender, without nodularity LYMPH:  no palpable lymphadenopathy in the cervical, axillary or inguinal LUNGS: clear to auscultation and percussion with normal breathing effort HEART: regular rate & rhythm and no murmurs and no lower extremity edema ABDOMEN:abdomen soft, non-tender and normal bowel sounds Musculoskeletal:no cyanosis of digits and no clubbing  NEURO: alert & oriented x 3 with fluent speech, no focal motor/sensory deficits  LABORATORY DATA:  I have reviewed the data as listed CMP Latest Ref Rng & Units 10/30/2018 10/29/2018 10/29/2018  Glucose 70 - 99 mg/dL 94 - 156(H)  BUN 8 - 23 mg/dL 30(H) - 33(H)  Creatinine 0.44 - 1.00 mg/dL 2.09(H) - 2.31(H)  Sodium 135 - 145 mmol/L 137 139 137  Potassium 3.5 - 5.1 mmol/L 4.9 2.5(LL) 2.4(LL)  Chloride 98 - 111 mmol/L 109 - 105  CO2 22 - 32 mmol/L 23 - 23  Calcium 8.9 - 10.3 mg/dL 8.2(L) - 8.5(L)  Total Protein 6.5 - 8.1 g/dL - - 5.8(L)  Total Bilirubin 0.3 - 1.2 mg/dL - - 0.5  Alkaline Phos 38 - 126 U/L - - 83  AST 15 - 41 U/L - - 11(L)  ALT 0 - 44 U/L - - 9    Lab Results  Component Value Date   WBC 2.6 (L) 10/30/2018   HGB 6.8 (LL) 10/30/2018   HCT 22.4 (L) 10/30/2018   MCV 91.1 10/30/2018   PLT 44 (L) 10/30/2018   NEUTROABS 2.4 10/29/2018    ASSESSMENT AND PLAN: This is a very pleasant 68 year old white female with limited stage small cell lung cancer and currently undergoing systemic chemotherapy with cisplatin and etoposide status post 3 cycles.  The patient has been tolerating her treatment well with no concerning complaints except for mild nausea as well as few episodes of diarrhea.  She was seen recently as an  outpatient by her gastroenterologist and sigmoidoscopy showed no concerning findings.  The patient presented to the hospital with increased weakness and there was initially concern for GI bleed.  However, stool for occult blood was negative.  CT scan did show evidence of colitis. GI is following the patient. No plan for a procedure at this time.   Labs from today have been reviewed.  Hemoglobin is low at 6.8.  Agree with blood transfusion.  Recommend continued monitoring of her CBC and transfuse packed red blood cells if her hemoglobin is less than 8.0 (due to blood shortage, blood bank will only allow for transfusion if the hemoglobin is less than 7.0 or active bleeding).  Platelet count is 44,000.  She received 1 unit of platelets as an outpatient on 10/29/2018.  Recommend platelet transfusion if platelet count is less than 10,000 or actively bleeding.  Total white blood cell count is low at 2.6 likely related to her recent chemotherapy.  She received Neulasta already with her chemotherapy and no additional growth factor support would be recommended.  Continue to monitor.  The patient reports intermittent nausea.  She has hydroxyzine ordered for her as needed.  If this is ineffective, consider changing this to prochlorperazine which is what she has been taking as needed at home for nausea and vomiting.  For her colitis, recommend antibiotics per hospitalist team.  Mikey Bussing, DNP, AGPCNP-BC, AOCNP

## 2018-10-30 NOTE — Progress Notes (Addendum)
CRITICAL VALUE ALERT  Critical Value:  Hgb 6.8  Date & Time Notied:  4/15 0645  Provider Notified: [facility], Baltazar Najjar  Orders Received/Actions taken: 1 unit PRBC to be given

## 2018-10-30 NOTE — Consult Note (Signed)
Subjective:   HPI  The patient is a 68 year old female with a history of small cell lung cancer undergoing chemotherapy.she was admitted to the hospital with melena and anemia. She has thrombocytopenia. She denies hematemesis. On June 19, 2018 she had an EGD by Dr. Loney Loh GI bleeding. She was found to have an ulcer of the esophagus without bleeding. Gastric ulcer without hemorrhage, duodenal ulcer without hemorrhage. She has been on pantoprazole since that time. Because of the drop in blood count. She was admitted.  Review of Systems fatigue  Past Medical History:  Diagnosis Date  . Arthritis   . Chronic pain   . Hyperlipidemia   . Hypertension   . Low blood magnesium 10/04/2018  . lung ca dx'd 06/2018  . Stroke ([place])    06/19/2018 minor   Past Surgical History:  Procedure Laterality Date  . BIOPSY  06/19/2018   Procedure: BIOPSY;  Surgeon: Laurence Spates, MD;  [facility];  Service: Endoscopy;;  . BIOPSY  10/11/2018   Procedure: BIOPSY;  Surgeon: Laurence Spates, MD;  [facility];  Service: Endoscopy;;  . ENDARTERECTOMY Right 06/26/2018   Procedure: ENDARTERECTOMY CAROTID RIGHT;  Surgeon: Serafina Mitchell, MD;  Location: [facility] OR;  Service: Vascular;  Laterality: Right;  . ESOPHAGOGASTRODUODENOSCOPY (EGD) WITH PROPOFOL N/A 06/19/2018   Procedure: ESOPHAGOGASTRODUODENOSCOPY (EGD) WITH PROPOFOL;  Surgeon: Laurence Spates, MD;  [facility];  Service: Endoscopy;  Laterality: N/A;  . FLEXIBLE SIGMOIDOSCOPY N/A 10/11/2018   Procedure: FLEXIBLE SIGMOIDOSCOPY;  Surgeon: Laurence Spates, MD;  [facility];  Service: Endoscopy;  Laterality: N/A;  . IR IMAGING GUIDED PORT INSERTION  10/07/2018  . PATCH ANGIOPLASTY Right 06/26/2018   Procedure: PATCH ANGIOPLASTY USING A XENOSURE 1cm x 6cm BIOLOGIC PATCH;  Surgeon: Serafina Mitchell, MD;  Location: [place];  Service: Vascular;  Laterality: Right;  Marland Kitchen VIDEO BRONCHOSCOPY WITH ENDOBRONCHIAL ULTRASOUND N/A 08/14/2018    Procedure: VIDEO BRONCHOSCOPY WITH ENDOBRONCHIAL ULTRASOUND;  Surgeon: Collene Gobble, MD;  Location[facility];  Service: Thoracic;  Laterality: N/A;   Social History   Socioeconomic History  . Marital status: Single    Spouse name: Not on file  . Number of children: 0  . Years of education: Not on file  . Highest education level: Not on file  Occupational History  . Not on file  Social Needs  . Financial resource strain: Not on file  . Food insecurity:    Worry: Not on file    Inability: Not on file  . Transportation needs:    Medical: Not on file    Non-medical: Not on file  Tobacco Use  . Smoking status: Former Smoker    Packs/day: 1.00    Years: 10.00    Pack years: 10.00    Types: Cigarettes    Last attempt to quit: 06/19/2018    Years since quitting: 0.3  . Smokeless tobacco: Never Used  . Tobacco comment: Currently using Nicorette Gum  Substance and Sexual Activity  . Alcohol use: Yes    Comment: wine 2 glasses a day   . Drug use: Never    Comment: Hemp Oil  . Sexual activity: Not Currently  Lifestyle  . Physical activity:    Days per week: Not on file    Minutes per session: Not on file  . Stress: Not on file  Relationships  . Social connections:    Talks on phone: Not on file    Gets together: Not on file    Attends  religious service: Not on file    Active member of club or organization: Not on file    Attends meetings of clubs or organizations: Not on file    Relationship status: Not on file  . Intimate partner violence:    Fear of current or ex partner: Not on file    Emotionally abused: Not on file    Physically abused: Not on file    Forced sexual activity: Not on file  Other Topics Concern  . 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  Current Facility-Administered Medications:  .  0.9 %  sodium chloride infusion, , Intravenous, Continuous, Ivor Costa, MD, Last Rate: 125 mL/hr at 10/30/18 0045 .  acetaminophen (TYLENOL) tablet 650 mg, 650 mg, Oral, Q6H PRN **OR** acetaminophen (TYLENOL) suppository 650 mg, 650 mg, Rectal, Q6H PRN, Ivor Costa, MD .  atorvastatin (LIPITOR) tablet 80 mg, 80 mg, Oral, q1800, Ivor Costa, MD .  feeding supplement (ENSURE ENLIVE) (ENSURE ENLIVE) liquid 237 mL, 237 mL, Oral, BID BM, Ivor Costa, MD, 237 mL at 10/30/18 0946 .  ferrous sulfate tablet 325 mg, 325 mg, Oral, Q breakfast, Ivor Costa, MD, 325 mg at 10/30/18 0800 .  hydrOXYzine (ATARAX/VISTARIL) tablet 10 mg, 10 mg, Oral, TID PRN, Ivor Costa, MD .  lidocaine-prilocaine (EMLA) cream 1 application, 1 application, Topical, PRN, Ivor Costa, MD .  magnesium oxide (MAG-OX) tablet 400 mg, 400 mg, Oral, QID, Ivor Costa, MD, 400 mg at 10/30/18 0946 .  oxyCODONE-acetaminophen (PERCOCET/ROXICET) 5-325 MG per tablet 1 tablet, 1 tablet, Oral, Q8H PRN, Ivor Costa, MD .  pantoprazole (PROTONIX) 80 mg in sodium chloride 0.9 % 250 mL (0.32 mg/mL) infusion, 8 mg/hr, Intravenous, Continuous, Ivor Costa, MD, Stopped at 10/29/18 2351 .  [START ON 11/02/2018] pantoprazole (PROTONIX) injection 40 mg, 40 mg, Intravenous, Q12H, Ivor Costa, MD .  piperacillin-tazobactam (ZOSYN) IVPB 3.375 g, 3.375 g, Intravenous, Q8H, Dorrene German, RPH, Last Rate: 12.5 mL/hr at 10/30/18 0552, 3.375 g at 10/30/18 0552 .  sucralfate (CARAFATE) 1 GM/10ML suspension 1 g, 1 g, Oral, TID WC & HS, Ivor Costa, MD, 1 g at 10/30/18 0801 No Known Allergies   Objective:     BP 114/84 (BP Location: Right Arm)   Pulse 90   Temp 98.1 F (36.7 C) (Oral)   Resp 18   Ht 5\' 5"  (1.651 m)   Wt 87 kg   SpO2 98%   BMI 31.92 kg/m   No distress  Heart regular rhythm no murmurs.  Lungs clear.  Abdomen soft  and nontender  Laboratory No components found for: D1    Assessment:     Symptomatic anemia from gastrointestinal blood loss.  History of esophageal ulcer ,gastric ulcer, duodenal ulcer from EGD on June 19, 2018.  Small cell cancer of the lung.  Thrombocytopenia      Plan:     PPI therapy. Discussed EGD with patient. At this time no plans for repeat EGD unless therapeutic intervention required to stop active bleeding. Lab Results  Component Value Date   HGB 6.8 (LL) 10/30/2018   HGB 7.5 (L) 10/30/2018   HGB 7.8 (L)  10/29/2018   HGB 7.5 (L) 10/29/2018   HGB 8.9 (L) 10/22/2018   HGB 9.6 (L) 10/15/2018   HGB 12.2 08/02/2018   HCT 22.4 (L) 10/30/2018   HCT 23.7 (L) 10/30/2018   HCT 23.0 (L) 10/29/2018   HCT 39.6 08/02/2018   ALKPHOS 83 10/29/2018   ALKPHOS 91 10/29/2018   ALKPHOS 98 10/22/2018   AST 11 (L) 10/29/2018   AST 11 (L) 10/29/2018   AST 12 (L) 10/22/2018   AST 14 (L) 10/15/2018   ALT 9 10/29/2018   ALT 7 10/29/2018   ALT 9 10/22/2018   ALT 9 10/15/2018

## 2018-10-30 NOTE — ED Notes (Signed)
ED TO INPATIENT HANDOFF REPORT  ED Nurse Name and Phone #: Noni Saupe Name/Age/Gender [name] 68 y.o. female Room/Bed: WA24/WA24  Code Status   Code Status: Full Code  Home/SNF/Other Given to floor Patient oriented to: self, place, time and situation Is this baseline? Yes   Triage Complete: Triage complete  Chief Complaint CA pt; hypotensive  Triage Note Per EMS, Pt was diagnosed with lung cancer in December. Pt complaining of Hypotension and left lower back pain. Pt has HTX of back pain but having acute episode today. Pts BP was 72/49.    Allergies No Known Allergies  Level of Care/Admitting Diagnosis ED Disposition    ED Disposition Condition Comment   Admit  Hospital Area: [place] [[id number]]  Level of Care: Telemetry [5]  Admit to tele based on following criteria: Other see comments  Comments: hypotension  Diagnosis: Symptomatic anemia [2951884]  Admitting Physician: Ivor Costa [[id number]]  Attending Physician: Ivor Costa [[id number]]  PT Class (Do Not Modify): Observation [104]  PT Acc Code (Do Not Modify): Observation [10022]       B Medical/Surgery History Past Medical History:  Diagnosis Date  . Arthritis   . Chronic pain   . Hyperlipidemia   . Hypertension   . Low blood magnesium 10/04/2018  . lung ca dx'd 06/2018  . Stroke ([place])    06/19/2018 minor   Past Surgical History:  Procedure Laterality Date  . BIOPSY  06/19/2018   Procedure: BIOPSY;  Surgeon: Laurence Spates, MD;  [facility];  Service: Endoscopy;;  . BIOPSY  10/11/2018   Procedure: BIOPSY;  Surgeon: Laurence Spates, MD;  [facility];  Service: Endoscopy;;  . ENDARTERECTOMY Right 06/26/2018   Procedure: ENDARTERECTOMY CAROTID RIGHT;  Surgeon: Serafina Mitchell, MD;  Location: [facility] OR;  Service: Vascular;  Laterality: Right;  . ESOPHAGOGASTRODUODENOSCOPY (EGD) WITH PROPOFOL N/A 06/19/2018   Procedure: ESOPHAGOGASTRODUODENOSCOPY (EGD) WITH  PROPOFOL;  Surgeon: Laurence Spates, MD;  [facility];  Service: Endoscopy;  Laterality: N/A;  . FLEXIBLE SIGMOIDOSCOPY N/A 10/11/2018   Procedure: FLEXIBLE SIGMOIDOSCOPY;  Surgeon: Laurence Spates, MD;  [facility];  Service: Endoscopy;  Laterality: N/A;  . IR IMAGING GUIDED PORT INSERTION  10/07/2018  . PATCH ANGIOPLASTY Right 06/26/2018   Procedure: PATCH ANGIOPLASTY USING A XENOSURE 1cm x 6cm BIOLOGIC PATCH;  Surgeon: Serafina Mitchell, MD;  Location: [place];  Service: Vascular;  Laterality: Right;  Marland Kitchen VIDEO BRONCHOSCOPY WITH ENDOBRONCHIAL ULTRASOUND N/A 08/14/2018   Procedure: VIDEO BRONCHOSCOPY WITH ENDOBRONCHIAL ULTRASOUND;  Surgeon: Collene Gobble, MD;  Location: [place] OR;  Service: Thoracic;  Laterality: N/A;     A IV Location/Drains/Wounds Patient Lines/Drains/Airways Status   Active Line/Drains/Airways    Name:   Placement date:   Placement time:   Site:   Days:   Implanted Port 10/07/18 Right Chest   10/07/18    1529    Chest   23   Peripheral IV 10/29/18 Left Hand   10/29/18    2112    Hand   1   Incision (Closed) 06/26/18 Neck Right   06/26/18    0932     126   Incision (Closed) 08/14/18 N/A Other (Comment)   08/14/18    0958     77          Intake/Output Last 24 hours  Intake/Output Summary (Last 24 hours) at 10/30/2018 0123 Last data filed at 10/30/2018 0043 Gross per 24 hour  Intake 311.4 ml  Output -  Net 311.4 ml    Labs/Imaging Results for orders placed or performed during the hospital encounter of 10/29/18 (from the past 48 hour(s))  Prepare RBC     Status: None   Collection Time: 10/29/18  9:50 PM  Result Value Ref Range   Order Confirmation      ORDER PROCESSED BY BLOOD BANK Performed at [facility] [address], [place], [place] [address]   Comprehensive metabolic panel     Status: Abnormal   Collection Time: 10/29/18  9:52 PM  Result Value Ref Range   Sodium 137 135 - 145 mmol/L   Potassium 2.4 (LL) 3.5 - 5.1  mmol/L    Comment: CRITICAL RESULT CALLED TO, READ BACK BY AND VERIFIED WITH: Z TEETER RN 2250 10/29/18 A NAVARRO    Chloride 105 98 - 111 mmol/L   CO2 23 22 - 32 mmol/L   Glucose, Bld 156 (H) 70 - 99 mg/dL   BUN 33 (H) 8 - 23 mg/dL   Creatinine, Ser 2.31 (H) 0.44 - 1.00 mg/dL   Calcium 8.5 (L) 8.9 - 10.3 mg/dL   Total Protein 5.8 (L) 6.5 - 8.1 g/dL   Albumin 3.4 (L) 3.5 - 5.0 g/dL   AST 11 (L) 15 - 41 U/L   ALT 9 0 - 44 U/L   Alkaline Phosphatase 83 38 - 126 U/L   Total Bilirubin 0.5 0.3 - 1.2 mg/dL   GFR calc non Af Amer 21 (L) >60 mL/min   GFR calc Af Amer 25 (L) >60 mL/min   Anion gap 9 5 - 15    Comment: Performed at [facility], [place] [address], [place], [place] [address]  CBC with Differential     Status: Abnormal   Collection Time: 10/29/18  9:52 PM  Result Value Ref Range   WBC 2.9 (L) 4.0 - 10.5 K/uL   RBC 2.86 (L) 3.87 - 5.11 MIL/uL   Hemoglobin 8.0 (L) 12.0 - 15.0 g/dL   HCT 25.7 (L) 36.0 - 46.0 %   MCV 89.9 80.0 - 100.0 fL   MCH 28.0 26.0 - 34.0 pg   MCHC 31.1 30.0 - 36.0 g/dL   RDW 20.6 (H) 11.5 - 15.5 %   Platelets 48 (L) 150 - 400 K/uL    Comment: REPEATED TO VERIFY PLATELET COUNT CONFIRMED BY SMEAR SPECIMEN CHECKED FOR CLOTS Immature Platelet Fraction may be clinically indicated, consider ordering this additional test [id number]    nRBC 0.0 0.0 - 0.2 %   Neutrophils Relative % 82 %   Neutro Abs 2.4 1.7 - 7.7 K/uL   Lymphocytes Relative 8 %   Lymphs Abs 0.2 (L) 0.7 - 4.0 K/uL   Monocytes Relative 8 %   Monocytes Absolute 0.2 0.1 - 1.0 K/uL   Eosinophils Relative 0 %   Eosinophils Absolute 0.0 0.0 - 0.5 K/uL   Basophils Relative 1 %   Basophils Absolute 0.0 0.0 - 0.1 K/uL   WBC Morphology MILD LEFT SHIFT (1-5% METAS, OCC MYELO, OCC BANDS)     Comment: FEW BANDS PRESENT   Immature Granulocytes 1 %   Abs Immature Granulocytes 0.02 0.00 - 0.07 K/uL   Dohle Bodies PRESENT    Ovalocytes PRESENT     Comment: Performed at [facility], [place] [address] [place], [place] [address]  Culture, blood (routine x 2)     Status: None (Preliminary result)   Collection Time: 10/29/18  9:52 PM  Result  Value Ref Range   Specimen Description      BLOOD PORTA CATH Performed at [facility] [address] [place], [place] [address]    Special Requests      BOTTLES DRAWN AEROBIC AND ANAEROBIC Blood Culture adequate volume Performed at [facility] [address], [place], [place] [address]    Culture PENDING    Report Status PENDING   Protime-INR     Status: None   Collection Time: 10/29/18  9:52 PM  Result Value Ref Range   Prothrombin Time 12.2 11.4 - 15.2 seconds   INR 0.9 0.8 - 1.2    Comment: (NOTE) INR goal varies based on device and disease states. Performed at [facility], [place] [address], [place], [place] [address]   Lactic acid, plasma     Status: None   Collection Time: 10/29/18  9:52 PM  Result Value Ref Range   Lactic Acid, Venous 1.2 0.5 - 1.9 mmol/L    Comment: Performed at [facility], [place] [address] [place], [place] [address]  Type and screen [place]     Status: None   Collection Time: 10/29/18  9:59 PM  Result Value Ref Range   ABO/RH(D) A POS    Antibody Screen NEG    Sample Expiration      11/01/2018 Performed at [facility], [place] [address] [place], [place] [address]   POCT I-Stat EG7     Status: Abnormal   Collection Time: 10/29/18 10:07 PM  Result Value Ref Range   pH, Ven 7.325 7.250 - 7.430   pCO2, Ven 43.4 (L) 44.0 - 60.0 mmHg   pO2, Ven 44.0 32.0 - 45.0 mmHg   Bicarbonate 22.6 20.0 - 28.0 mmol/L   TCO2 24 22 - 32 mmol/L   O2 Saturation 76.0 %   Acid-base deficit 3.0 (H) 0.0 - 2.0 mmol/L   Sodium 139 135 - 145 mmol/L   Potassium 2.5 (LL) 3.5 - 5.1 mmol/L   Calcium, Ion 1.20 1.15 - 1.40 mmol/L   HCT 23.0 (L) 36.0 - 46.0 %   Hemoglobin 7.8 (L) 12.0 - 15.0 g/dL    Patient temperature HIDE    Sample type VENOUS    Comment NOTIFIED PHYSICIAN   Magnesium     Status: None   Collection Time: 10/29/18 10:55 PM  Result Value Ref Range   Magnesium 1.8 1.7 - 2.4 mg/dL    Comment: Performed at [facility], [place] [address] [place], [place] [address]  POC occult blood, ED RN will collect     Status: None   Collection Time: 10/29/18 11:20 PM  Result Value Ref Range   Fecal Occult Bld NEGATIVE NEGATIVE  CBC     Status: Abnormal   Collection Time: 10/30/18 12:49 AM  Result Value Ref Range   WBC 3.4 (L) 4.0 - 10.5 K/uL   RBC 2.65 (L) 3.87 - 5.11 MIL/uL   Hemoglobin 7.5 (L) 12.0 - 15.0 g/dL   HCT 23.7 (L) 36.0 - 46.0 %   MCV 89.4 80.0 - 100.0 fL   MCH 28.3 26.0 - 34.0 pg   MCHC 31.6 30.0 - 36.0 g/dL   RDW 20.6 (H) 11.5 - 15.5 %   Platelets 47 (L) 150 - 400 K/uL    Comment: REPEATED TO VERIFY SPECIMEN CHECKED FOR CLOTS Immature Platelet Fraction may be clinically indicated, consider ordering this additional test [id number]    nRBC 0.0 0.0 -  0.2 %    Comment: Performed at [facility], [place] [address] [place], [place] [address]  APTT     Status: None   Collection Time: 10/30/18 12:49 AM  Result Value Ref Range   aPTT 25 24 - 36 seconds    Comment: Performed at [facility], [place] [address], [place], [place] [address]   Ct Abdomen Pelvis Wo Contrast  Result Date: 10/29/2018 CLINICAL DATA:  Left-sided flank pain, history of lung carcinoma EXAM: CT ABDOMEN AND PELVIS WITHOUT CONTRAST TECHNIQUE: Multidetector CT imaging of the abdomen and pelvis was performed following the standard protocol without IV contrast. COMPARISON:  08/30/2018 FINDINGS: Lower chest: Lung bases demonstrate a right lower lobe mass lesion which now measures 2.9 cm in greatest dimension. This has decreased in size when compared with the prior PET-CT from 4.6 cm. This is consistent with the patient's given clinical history of lung  carcinoma and recent treatment. Hepatobiliary: No focal liver abnormality is seen. No gallstones, gallbladder wall thickening, or biliary dilatation. Pancreas: Unremarkable. No pancreatic ductal dilatation or surrounding inflammatory changes. Spleen: Normal in size without focal abnormality. Adrenals/Urinary Tract: Adrenal glands are within normal limits. The kidneys are within normal limits. No renal calculi or obstructive changes are noted. The bladder is decompressed. Stomach/Bowel: Appendix is well visualized and within normal limits. Very mild pericolonic inflammatory changes are noted in the sigmoid colon. Small bowels within normal limits. Stomach is unremarkable. Vascular/Lymphatic: Aortic atherosclerosis. No enlarged abdominal or pelvic lymph nodes. Reproductive: Uterus and bilateral adnexa are unremarkable. Other: Mild free fluid is noted within the pelvis likely of a reactive nature. Musculoskeletal: Degenerative changes of lumbar spine are noted. IMPRESSION: Decrease in size in right lower lobe mass consistent with previous treatment. Mild pericolonic inflammatory changes likely of an infectious etiology. No abscess or perforation is identified. These changes are concentrated predominately within the sigmoid. Electronically Signed   By: Inez Catalina M.D.   On: 10/29/2018 23:28   Dg Chest Port 1 View  Result Date: 10/29/2018 CLINICAL DATA:  Hypertension and history of lung carcinoma EXAM: PORTABLE CHEST 1 VIEW COMPARISON:  10/11/2018 FINDINGS: Cardiac shadows within normal limits. Right-sided chest wall port is noted with catheter tip at the cavoatrial junction. Lungs are well aerated bilaterally. Persistent right lower lobe mass lesion is noted stable from the recent CT examination. Stable right paratracheal prominence is noted consistent with lymphadenopathy. No bony abnormality is noted. IMPRESSION: Stable lung mass on the right. No new focal abnormality is seen. Electronically Signed   By: Inez Catalina M.D.   On: 10/29/2018 21:45    Pending Labs Unresulted Labs (From admission, onward)    Start     Ordered   10/30/18 4193  Basic metabolic panel  Tomorrow morning,   R     10/30/18 0045   10/30/18 0500  CBC  Tomorrow morning,   R     10/30/18 0045   10/30/18 0023  Procalcitonin  ONCE - STAT,   R     10/30/18 0023   10/30/18 0022  CBC  Now then every 6 hours,   R     10/30/18 0021   10/29/18 2129  Lactic acid, plasma  Now then every 2 hours,   STAT     10/29/18 2128   10/29/18 2126  Culture, blood (routine x 2)  BLOOD CULTURE X 2,   STAT     10/29/18 2126   10/29/18 2126  Urinalysis, Routine w reflex microscopic  ONCE - STAT,   STAT     10/29/18 2126          Vitals/Pain Today's Vitals   10/29/18 2121 10/29/18 2300 10/29/18 2318 10/29/18 2330  BP:  116/81  109/75  Pulse:  93  86  Resp:  15  14  Temp:      TempSrc:      SpO2:  100%  98%  Weight: 85.7 kg     Height: 5\' 5"  (1.651 m)     PainSc:   0-No pain     Isolation Precautions No active isolations  Medications Medications  pantoprazole (PROTONIX) 80 mg in sodium chloride 0.9 % 250 mL (0.32 mg/mL) infusion (0 mg/hr Intravenous Stopped 10/29/18 2351)  pantoprazole (PROTONIX) injection 40 mg (has no administration in time range)  potassium chloride 10 mEq in 100 mL IVPB (10 mEq Intravenous New Bag/Given 10/30/18 0020)  0.9 %  sodium chloride infusion ( Intravenous New Bag/Given 10/30/18 0045)  hydrOXYzine (ATARAX/VISTARIL) tablet 10 mg (has no administration in time range)  piperacillin-tazobactam (ZOSYN) IVPB 3.375 g (has no administration in time range)  acetaminophen (TYLENOL) tablet 650 mg (has no administration in time range)    Or  acetaminophen (TYLENOL) suppository 650 mg (has no administration in time range)  potassium chloride SA (K-DUR,KLOR-CON) CR tablet 40 mEq (has no administration in time range)  0.9 %  sodium chloride infusion (Manually program via Guardrails IV Fluids) ( Intravenous Stopped  10/30/18 0043)  fentaNYL (SUBLIMAZE) injection 25 mcg (25 mcg Intravenous Given 10/29/18 2217)  pantoprazole (PROTONIX) 80 mg in sodium chloride 0.9 % 100 mL IVPB (0 mg Intravenous Stopped 10/29/18 2317)  piperacillin-tazobactam (ZOSYN) IVPB 3.375 g (0 g Intravenous Stopped 10/30/18 0018)  sodium chloride 0.9 % bolus 1,000 mL (1,000 mLs Intravenous New Bag/Given 10/30/18 0045)    Mobility walks with person assist Low fall risk   Focused Assessments Cardiac Assessment Handoff:  Cardiac Rhythm: Normal sinus rhythm Lab Results  Component Value Date   TROPONINI <0.03 06/21/2018   No results found for: DDIMER Does the Patient currently have chest pain? No  , Pulmonary Assessment Handoff:  Lung sounds:   O2 Device: Room Air        R Recommendations: See Admitting Provider Note  Report given to:   Additional Notes:

## 2018-10-30 NOTE — Progress Notes (Signed)
PROGRESS NOTE    [name]  PQD:[id number] DOB: 09-10-1950 DOA: 10/29/2018 PCP: Rutherford Guys, MD   Brief Narrative: [name] is a 68 y.o. female with a history of PUD, hypertension, hyperlipidemia, stroke, GERD, small cell lung cancer on radiation and chemotherapy, iron deficiency anemia, upper GI bleeding, CKD 3. Patient presented secondary to weakness, nausea/vomiting, diarrhea and black stool, concerning for upper GI bleeding.   Assessment & Plan:   Principal Problem:   Symptomatic anemia Active Problems:   HLD (hyperlipidemia)   Essential hypertension   Small cell carcinoma of lower lobe of right lung (HCC)   GIB (gastrointestinal bleeding)   Hypotension   Hypokalemia   Acute renal failure superimposed on stage 3 chronic kidney disease (HCC)   Pancytopenia (HCC)   Nausea vomiting and diarrhea   PUD (peptic ulcer disease)   Sepsis (HCC)   Symptomatic anemia Pancytopenia In setting of active chemotherapy treatment in addition to suspected blood loss anemia from suspected upper GI bleed. Hemoglobin down to 6.8 this morning. WBC and platelets stable. 1 unit of PRBC ordered for transfusion.  GI Bleed Presumed upper GI. Transfusing as mentioned above -Continue Protonix -Continue IV fluids -[facility] consulted  Nausea/vomiting/diarrhea CT scan significant for possible colitis. Afebrile. Symptoms resolved. Sepsis criteria met on admission. Blood cultures pending -Continue Zosyn  Hypotension Resolved.  Hyperlipidemia -Continue Lipitor  Essential hypertension -Continue to hold lisinopril and chlorthalidone  Hypokalemia Resolved.  AKI on CKD stage 3 Baseline creatinine of 1-1.3. Creatinine of 2.25 on admission, down to 2.09 today. -Continue IV fluids -Hold lisinopril  PUD -Continue Protonix as mentioned above -Continue Carafate  Hypomagnesemia Chronic. Patient takes magnesium oxide as an outpatient -Continue magnesium oxide, however,  will be cautious in light of kidney disease   DVT prophylaxis: SCDs  Code Status:   Code Status: Full Code Family Communication: None at bedside Disposition Plan: Discharge pending GI workup   Consultants:   [facility]  Medical oncology  Procedures:   None  Antimicrobials:  Zosyn    Subjective: Feels very fatigued/weak today. No abdominal pain. No bloody emesis or stool overnight. Afebrile.  Objective: Vitals:   10/29/18 2300 10/29/18 2330 10/30/18 0205 10/30/18 0450  BP: 116/81 109/75 112/76 106/66  Pulse: 93 86 85 85  Resp: '15 14 18 17  ' Temp:   (!) 97.4 F (36.3 C) 97.8 F (36.6 C)  TempSrc:   Oral Oral  SpO2: 100% 98% 100% 95%  Weight:   87 kg   Height:        Intake/Output Summary (Last 24 hours) at 10/30/2018 0852 Last data filed at 10/30/2018 0353 Gross per 24 hour  Intake 1875.52 ml  Output --  Net 1875.52 ml   Filed Weights   10/29/18 2121 10/30/18 0205  Weight: 85.7 kg 87 kg    Examination:  General exam: Appears calm and comfortable Respiratory system: Clear to auscultation. Respiratory effort normal. Cardiovascular system: S1 & S2 heard, RRR. No murmurs, rubs, gallops or clicks. Gastrointestinal system: Abdomen is nondistended, soft and nontender. No organomegaly or masses felt. Normal bowel sounds heard. Central nervous system: Alert and oriented. No focal neurological deficits. Extremities: No edema. No calf tenderness Skin: No cyanosis. No rashes Psychiatry: Judgement and insight appear normal. Mood & affect appropriate.     Data Reviewed: I have personally reviewed following labs and imaging studies  CBC: Recent Labs  Lab 10/29/18 1411 10/29/18 2152 10/29/18 2207 10/30/18 0049 10/30/18 0616  WBC 2.6* 2.9*  --  3.4* 2.6*  NEUTROABS 1.8 2.4  --   --   --   HGB 7.5* 8.0* 7.8* 7.5* 6.8*  HCT 23.7* 25.7* 23.0* 23.7* 22.4*  MCV 87.1 89.9  --  89.4 91.1  PLT 7* 48*  --  47* 44*   Basic Metabolic Panel: Recent Labs   Lab 10/29/18 1411 10/29/18 2152 10/29/18 2207 10/29/18 2255 10/30/18 0616  NA 139 137 139  --  137  K 4.0 2.4* 2.5*  --  4.9  CL 104 105  --   --  109  CO2 25 23  --   --  23  GLUCOSE 121* 156*  --   --  94  BUN 31* 33*  --   --  30*  CREATININE 2.25* 2.31*  --   --  2.09*  CALCIUM 9.1 8.5*  --   --  8.2*  MG 1.9  --   --  1.8  --    GFR: Estimated Creatinine Clearance: 29.2 mL/min (A) (by C-G formula based on SCr of 2.09 mg/dL (H)). Liver Function Tests: Recent Labs  Lab 10/29/18 1411 10/29/18 2152  AST 11* 11*  ALT 7 9  ALKPHOS 91 83  BILITOT 0.2* 0.5  PROT 6.3* 5.8*  ALBUMIN 3.5 3.4*   No results for input(s): LIPASE, AMYLASE in the last 168 hours. No results for input(s): AMMONIA in the last 168 hours. Coagulation Profile: Recent Labs  Lab 10/29/18 2152  INR 0.9   Cardiac Enzymes: No results for input(s): CKTOTAL, CKMB, CKMBINDEX, TROPONINI in the last 168 hours. BNP (last 3 results) No results for input(s): PROBNP in the last 8760 hours. HbA1C: No results for input(s): HGBA1C in the last 72 hours. CBG: Recent Labs  Lab 10/30/18 0756  GLUCAP 89   Lipid Profile: No results for input(s): CHOL, HDL, LDLCALC, TRIG, CHOLHDL, LDLDIRECT in the last 72 hours. Thyroid Function Tests: No results for input(s): TSH, T4TOTAL, FREET4, T3FREE, THYROIDAB in the last 72 hours. Anemia Panel: No results for input(s): VITAMINB12, FOLATE, FERRITIN, TIBC, IRON, RETICCTPCT in the last 72 hours. Sepsis Labs: Recent Labs  Lab 10/29/18 2152 10/30/18 0049 10/30/18 0616  PROCALCITON  --  0.24  --   LATICACIDVEN 1.2  --  0.9    Recent Results (from the past 240 hour(s))  Culture, blood (routine x 2)     Status: None (Preliminary result)   Collection Time: 10/29/18  9:52 PM  Result Value Ref Range Status   Specimen Description   Final    BLOOD PORTA CATH Performed at [facility], [place] [address] [place], [place] [address]    Special Requests   Final     BOTTLES DRAWN AEROBIC AND ANAEROBIC Blood Culture adequate volume Performed at [facility] [address], [place],  [address]    Culture PENDING  Incomplete   Report Status PENDING  Incomplete         Radiology Studies: Ct Abdomen Pelvis Wo Contrast  Result Date: 10/29/2018 CLINICAL DATA:  Left-sided flank pain, history of lung carcinoma EXAM: CT ABDOMEN AND PELVIS WITHOUT CONTRAST TECHNIQUE: Multidetector CT imaging of the abdomen and pelvis was performed following the standard protocol without IV contrast. COMPARISON:  08/30/2018 FINDINGS: Lower chest: Lung bases demonstrate a right lower lobe mass lesion which now measures 2.9 cm in greatest dimension. This has decreased in size when compared with the prior PET-CT from 4.6 cm. This is consistent with the patient's given clinical history of lung carcinoma  and recent treatment. Hepatobiliary: No focal liver abnormality is seen. No gallstones, gallbladder wall thickening, or biliary dilatation. Pancreas: Unremarkable. No pancreatic ductal dilatation or surrounding inflammatory changes. Spleen: Normal in size without focal abnormality. Adrenals/Urinary Tract: Adrenal glands are within normal limits. The kidneys are within normal limits. No renal calculi or obstructive changes are noted. The bladder is decompressed. Stomach/Bowel: Appendix is well visualized and within normal limits. Very mild pericolonic inflammatory changes are noted in the sigmoid colon. Small bowels within normal limits. Stomach is unremarkable. Vascular/Lymphatic: Aortic atherosclerosis. No enlarged abdominal or pelvic lymph nodes. Reproductive: Uterus and bilateral adnexa are unremarkable. Other: Mild free fluid is noted within the pelvis likely of a reactive nature. Musculoskeletal: Degenerative changes of lumbar spine are noted. IMPRESSION: Decrease in size in right lower lobe mass consistent with previous treatment. Mild pericolonic inflammatory  changes likely of an infectious etiology. No abscess or perforation is identified. These changes are concentrated predominately within the sigmoid. Electronically Signed   By: Inez Catalina M.D.   On: 10/29/2018 23:28   Dg Chest Port 1 View  Result Date: 10/29/2018 CLINICAL DATA:  Hypertension and history of lung carcinoma EXAM: PORTABLE CHEST 1 VIEW COMPARISON:  10/11/2018 FINDINGS: Cardiac shadows within normal limits. Right-sided chest wall port is noted with catheter tip at the cavoatrial junction. Lungs are well aerated bilaterally. Persistent right lower lobe mass lesion is noted stable from the recent CT examination. Stable right paratracheal prominence is noted consistent with lymphadenopathy. No bony abnormality is noted. IMPRESSION: Stable lung mass on the right. No new focal abnormality is seen. Electronically Signed   By: Inez Catalina M.D.   On: 10/29/2018 21:45        Scheduled Meds:  sodium chloride   Intravenous Once   atorvastatin  80 mg Oral q1800   feeding supplement (ENSURE ENLIVE)  237 mL Oral BID BM   ferrous sulfate  325 mg Oral Q breakfast   magnesium oxide  400 mg Oral QID   [START ON 11/02/2018] pantoprazole  40 mg Intravenous Q12H   sucralfate  1 g Oral TID WC & HS   Continuous Infusions:  sodium chloride 125 mL/hr at 10/30/18 0045   pantoprozole (PROTONIX) infusion Stopped (10/29/18 2351)   piperacillin-tazobactam (ZOSYN)  IV 3.375 g (10/30/18 0552)     LOS: 0 days     Cordelia Poche, MD [facility] 10/30/2018, 8:52 AM  If 7PM-7AM, please contact night-coverage [email or web address]

## 2018-10-31 ENCOUNTER — Telehealth: Payer: Self-pay | Admitting: Physician Assistant

## 2018-10-31 ENCOUNTER — Ambulatory Visit
Admission: RE | Admit: 2018-10-31 | Discharge: 2018-10-31 | Disposition: A | Discharge status: Home / Self Care | Payer: Medicare Other | Source: Ambulatory Visit | Attending: Radiation Oncology | Admitting: Radiation Oncology

## 2018-10-31 DIAGNOSIS — D61818 Other pancytopenia: Secondary | ICD-10-CM | POA: Diagnosis not present

## 2018-10-31 DIAGNOSIS — I10 Essential (primary) hypertension: Secondary | ICD-10-CM | POA: Diagnosis not present

## 2018-10-31 DIAGNOSIS — C3431 Malignant neoplasm of lower lobe, right bronchus or lung: Secondary | ICD-10-CM | POA: Diagnosis not present

## 2018-10-31 DIAGNOSIS — N179 Acute kidney failure, unspecified: Secondary | ICD-10-CM | POA: Diagnosis not present

## 2018-10-31 LAB — CBC
HCT: 21.7 % — ABNORMAL LOW (ref 36.0–46.0)
HCT: 22 % — ABNORMAL LOW (ref 36.0–46.0)
HCT: 21.6 % — ABNORMAL LOW (ref 36.0–46.0)
Hemoglobin: 6.9 g/dL — CL (ref 12.0–15.0)
Hemoglobin: 7 g/dL — ABNORMAL LOW (ref 12.0–15.0)
Hemoglobin: 6.9 g/dL — CL (ref 12.0–15.0)
MCH: 28.4 pg (ref 26.0–34.0)
MCH: 28.3 pg (ref 26.0–34.0)
MCH: 28.4 pg (ref 26.0–34.0)
MCHC: 31.8 g/dL (ref 30.0–36.0)
MCHC: 31.8 g/dL (ref 30.0–36.0)
MCHC: 31.9 g/dL (ref 30.0–36.0)
MCV: 89.3 fL (ref 80.0–100.0)
MCV: 89.1 fL (ref 80.0–100.0)
MCV: 88.9 fL (ref 80.0–100.0)
Platelets: 33 10*3/uL — ABNORMAL LOW (ref 150–400)
Platelets: 32 10*3/uL — ABNORMAL LOW (ref 150–400)
Platelets: 33 10*3/uL — ABNORMAL LOW (ref 150–400)
RBC: 2.43 MIL/uL — ABNORMAL LOW (ref 3.87–5.11)
RBC: 2.47 MIL/uL — ABNORMAL LOW (ref 3.87–5.11)
RBC: 2.43 MIL/uL — ABNORMAL LOW (ref 3.87–5.11)
RDW: 20.5 % — ABNORMAL HIGH (ref 11.5–15.5)
RDW: 20.5 % — ABNORMAL HIGH (ref 11.5–15.5)
RDW: 20.1 % — ABNORMAL HIGH (ref 11.5–15.5)
WBC: 2.6 10*3/uL — ABNORMAL LOW (ref 4.0–10.5)
WBC: 2.8 10*3/uL — ABNORMAL LOW (ref 4.0–10.5)
WBC: 3.9 10*3/uL — ABNORMAL LOW (ref 4.0–10.5)
nRBC: 0 % (ref 0.0–0.2)
nRBC: 0 % (ref 0.0–0.2)
nRBC: 0 % (ref 0.0–0.2)

## 2018-10-31 LAB — LACTATE DEHYDROGENASE: LDH: 117 U/L (ref 98–192)

## 2018-10-31 LAB — BASIC METABOLIC PANEL
Anion gap: 6 (ref 5–15)
BUN: 21 mg/dL (ref 8–23)
CO2: 22 mmol/L (ref 22–32)
Calcium: 8.4 mg/dL — ABNORMAL LOW (ref 8.9–10.3)
Chloride: 112 mmol/L — ABNORMAL HIGH (ref 98–111)
Creatinine, Ser: 1.95 mg/dL — ABNORMAL HIGH (ref 0.44–1.00)
GFR calc Af Amer: 31 mL/min — ABNORMAL LOW (ref >60)
GFR calc non Af Amer: 26 mL/min — ABNORMAL LOW (ref >60)
Glucose, Bld: 92 mg/dL (ref 70–99)
Potassium: 3.9 mmol/L (ref 3.5–5.1)
Sodium: 140 mmol/L (ref 135–145)

## 2018-10-31 LAB — DIFFERENTIAL
Basophils Absolute: 0 10*3/uL (ref 0.0–0.1)
Basophils Relative: 0 %
Eosinophils Absolute: 0 10*3/uL (ref 0.0–0.5)
Eosinophils Relative: 1 %
Lymphocytes Relative: 12 %
Lymphs Abs: 0.3 10*3/uL — ABNORMAL LOW (ref 0.7–4.0)
Monocytes Absolute: 0.3 10*3/uL (ref 0.1–1.0)
Monocytes Relative: 10 %
Neutro Abs: 2.2 10*3/uL (ref 1.7–7.7)
Neutrophils Relative %: 77 %

## 2018-10-31 LAB — OCCULT BLOOD X 1 CARD TO LAB, STOOL: Fecal Occult Bld: NEGATIVE

## 2018-10-31 LAB — GLUCOSE, CAPILLARY: Glucose-Capillary: 112 mg/dL — ABNORMAL HIGH (ref 70–99)

## 2018-10-31 LAB — PREPARE RBC (CROSSMATCH)

## 2018-10-31 MED ORDER — SODIUM CHLORIDE 0.9% IV SOLUTION: Freq: Once | INTRAVENOUS | Status: DC

## 2018-10-31 NOTE — Progress Notes (Signed)
HEMATOLOGY-ONCOLOGY PROGRESS NOTE  SUBJECTIVE: [name] is a 68 year old female followed by Dr. Julien Nordmann for limited stage (T2b, N2, M0) small cell lung cancer diagnosed in January 2020.  She has been receiving systemic chemotherapy concurrent with radiation.  Chemotherapy consist of cisplatin 80 mg meter squared on day 1 and etoposide 100 mg meter squared on days 1, 2, and 3 every 3 weeks.  She is status post 3 cycles of her chemotherapy.  Last cycle was started on 10/16/2018.  The patient received Neulasta with her chemotherapy.  The patient reports that she feels better this morning.  Has intermittent nausea but no vomiting.  States that her nausea improves with antiemetics.  Will to eat and drink.  States that she moved her bowels and that her stool is dark but not black.  Reports bright red blood when she wiped secondary to a hemorrhoid.  No other bleeding reported.    Small cell carcinoma of lower lobe of right lung ([place])   08/28/2018 Initial Diagnosis    Small cell carcinoma of lower lobe of right lung ([place])    09/03/2018 -  Chemotherapy    The patient had palonosetron (ALOXI) injection 0.25 mg, 0.25 mg, Intravenous,  Once, 3 of 6 cycles Administration: 0.25 mg (09/03/2018), 0.25 mg (09/25/2018), 0.25 mg (10/16/2018) pegfilgrastim (NEULASTA ONPRO KIT) injection 6 mg, 6 mg, Subcutaneous, Once, 2 of 5 cycles Administration: 6 mg (09/27/2018), 6 mg (10/18/2018) CISplatin (PLATINOL) 165 mg in sodium chloride 0.9 % 500 mL chemo infusion, 80 mg/m2 = 165 mg, Intravenous,  Once, 3 of 6 cycles Administration: 165 mg (09/03/2018), 165 mg (09/25/2018), 165 mg (10/16/2018) etoposide (VEPESID) 200 mg in sodium chloride 0.9 % 500 mL chemo infusion, 97 mg/m2 = 210 mg, Intravenous,  Once, 3 of 6 cycles Administration: 200 mg (09/03/2018), 200 mg (09/04/2018), 200 mg (09/05/2018), 200 mg (09/25/2018), 200 mg (09/26/2018), 200 mg (09/27/2018), 200 mg (10/16/2018), 200 mg (10/17/2018), 200 mg (10/18/2018) fosaprepitant (EMEND) 150  mg, dexamethasone (DECADRON) 12 mg in sodium chloride 0.9 % 145 mL IVPB, , Intravenous,  Once, 3 of 6 cycles Administration:  (09/03/2018),  (09/25/2018),  (10/16/2018)  for chemotherapy treatment.      REVIEW OF SYSTEMS:   Constitutional: Reports fatigue.  Denies fevers and chills. Eyes: Denies blurriness of vision Ears, nose, mouth, throat, and face: Denies mucositis or sore throat Respiratory: Denies cough, dyspnea or wheezes.  Denies hemoptysis. Cardiovascular: Denies palpitation, chest discomfort Gastrointestinal: Reports nausea.  No vomiting since admission.  Had bright red blood per rectum when she wiped.  This was a small amount.  She states that she has a hemorrhoid. Skin: Denies abnormal skin rashes Lymphatics: Denies new lymphadenopathy or easy bruising Neurological:Denies numbness, tingling or new weaknesses Behavioral/Psych: Mood is stable, no new changes  Extremities: No lower extremity edema All other systems were reviewed with the patient and are negative.  I have reviewed the past medical history, past surgical history, social history and family history with the patient and they are unchanged from previous note.   PHYSICAL EXAMINATION:  Vitals:   10/30/18 2059 10/31/18 0603  BP: 120/82 (!) 140/92  Pulse: 90 78  Resp: 16 18  Temp: 98 F (36.7 C) 97.7 F (36.5 C)  SpO2: 94% 99%   Filed Weights   10/29/18 2121 10/30/18 0205  Weight: 189 lb (85.7 kg) 191 lb 12.8 oz (87 kg)    GENERAL:alert, no distress and comfortable SKIN: skin color, texture, turgor are normal, no rashes or significant lesions EYES: normal,  Conjunctiva are pink and non-injected, sclera clear OROPHARYNX:no exudate, no erythema and lips, buccal mucosa, and tongue normal  NECK: supple, thyroid normal size, non-tender, without nodularity LYMPH:  no palpable lymphadenopathy in the cervical, axillary or inguinal LUNGS: clear to auscultation and percussion with normal breathing effort HEART: regular  rate & rhythm and no murmurs and no lower extremity edema ABDOMEN:abdomen soft, non-tender and normal bowel sounds Musculoskeletal:no cyanosis of digits and no clubbing  NEURO: alert & oriented x 3 with fluent speech, no focal motor/sensory deficits  LABORATORY DATA:  I have reviewed the data as listed CMP Latest Ref Rng & Units 10/30/2018 10/29/2018 10/29/2018  Glucose 70 - 99 mg/dL 94 - 156(H)  BUN 8 - 23 mg/dL 30(H) - 33(H)  Creatinine 0.44 - 1.00 mg/dL 2.09(H) - 2.31(H)  Sodium 135 - 145 mmol/L 137 139 137  Potassium 3.5 - 5.1 mmol/L 4.9 2.5(LL) 2.4(LL)  Chloride 98 - 111 mmol/L 109 - 105  CO2 22 - 32 mmol/L 23 - 23  Calcium 8.9 - 10.3 mg/dL 8.2(L) - 8.5(L)  Total Protein 6.5 - 8.1 g/dL - - 5.8(L)  Total Bilirubin 0.3 - 1.2 mg/dL - - 0.5  Alkaline Phos 38 - 126 U/L - - 83  AST 15 - 41 U/L - - 11(L)  ALT 0 - 44 U/L - - 9    Lab Results  Component Value Date   WBC 2.6 (L) 10/30/2018   HGB 6.9 (LL) 10/30/2018   HCT 21.7 (L) 10/30/2018   MCV 89.3 10/30/2018   PLT 33 (L) 10/30/2018   NEUTROABS 2.4 10/29/2018    ASSESSMENT AND PLAN: This is a very pleasant 68 year old white female with limited stage small cell lung cancer and currently undergoing systemic chemotherapy with cisplatin and etoposide status post 3 cycles.  The patient has been tolerating her treatment well with no concerning complaints except for mild nausea as well as few episodes of diarrhea.  She was seen recently as an outpatient by her gastroenterologist and sigmoidoscopy showed no concerning findings.  The patient presented to the hospital with increased weakness and there was initially concern for GI bleed.  However, stool for occult blood was negative.  CT scan showed evidence of colitis. GI is following the patient. No plan for a procedure at this time.   CBC from today is pending.  Recommend blood transfusion if her hemoglobin is less than 7.0.  Recommend platelet transfusion if her platelet count is less than  10,000 or actively bleeding.  Total white blood cell count has been low but ANC was normal on admission.  She received Neulasta with her chemotherapy.  No factor support is indicated.  Continue to monitor.  Remains afebrile.  For nausea, continue PRN antiemetics.  For her colitis, recommend antibiotics per hospitalist team.  Mikey Bussing, DNP, AGPCNP-BC, AOCNP

## 2018-10-31 NOTE — Progress Notes (Signed)
CRITICAL VALUE ALERT  Critical Value:  Hgb: 6.9  Date & Time Notied:  10/31/18 0104  Provider Notified: Baltazar Najjar, NP 10/31/18 0106  Orders Received/Actions taken: Transfuse one unit RBC

## 2018-10-31 NOTE — Telephone Encounter (Signed)
Spoke to patient's significant other, Dominica Severin regarding her appointment on 4/20. She is currently in the hospital so I was calling to inform him that I will be cancelling her appointment on 4/20 to allow her time to recover. We will reschedule her for ~4/27. He knows our scheduling department will be in touch for the exact date and time. He expressed understanding.

## 2018-10-31 NOTE — Progress Notes (Signed)
PROGRESS NOTE    [name]  QPR:[id number] DOB: Mar 12, 1951 DOA: 10/29/2018 PCP: Rutherford Guys, MD   Brief Narrative: [name] is a 68 y.o. female with a history of PUD, hypertension, hyperlipidemia, stroke, GERD, small cell lung cancer on radiation and chemotherapy, iron deficiency anemia, upper GI bleeding, CKD 3. Patient presented secondary to weakness, nausea/vomiting, diarrhea and black stool, concerning for upper GI bleeding.   Assessment & Plan:   Principal Problem:   Symptomatic anemia Active Problems:   HLD (hyperlipidemia)   Essential hypertension   Small cell carcinoma of lower lobe of right lung (HCC)   GIB (gastrointestinal bleeding)   Hypotension   Hypokalemia   Acute renal failure superimposed on stage 3 chronic kidney disease (HCC)   Pancytopenia (HCC)   Nausea vomiting and diarrhea   PUD (peptic ulcer disease)   Sepsis (HCC)   Symptomatic anemia Pancytopenia In setting of active chemotherapy treatment in addition to suspected blood loss anemia from suspected upper GI bleed. Hemoglobin down to 6.8 this morning. WBC stable. Platelets trending down. S/p 1 unit of PRBC with another unit ordered 4/16 and is still pending -CBC pending this AM and will check before giving previously ordered unit of blood  GI Bleed Presumed upper GI. Transfusing as mentioned above. GI consulted and discussed EGD which has been postponed for nwo -Continue Protonix -Continue IV fluids -[facility] recommendations today  Nausea/vomiting/diarrhea CT scan significant for possible colitis. Afebrile. Symptoms resolved. Sepsis criteria met on admission. Blood cultures pending but are no growth to date (less than 24 hour report) -Continue Zosyn  Hypotension Resolved.  Hyperlipidemia -Continue Lipitor  Essential hypertension -Continue to hold lisinopril and chlorthalidone in setting of AKI  Hypokalemia Resolved.  AKI on CKD stage 3 Baseline creatinine of  1-1.3. Creatinine of 2.25 on admission, down to 2.09 yesterday. BMP pending today -Continue IV fluids -Hold lisinopril/hctz  PUD -Continue Protonix as mentioned above -Continue Carafate  Hypomagnesemia Chronic. Patient takes magnesium oxide as an outpatient -Continue magnesium oxide, however, will be cautious in light of kidney disease   DVT prophylaxis: SCDs  Code Status:   Code Status: Full Code Family Communication: None at bedside Disposition Plan: Discharge pending GI workup   Consultants:   [facility]  Medical oncology  Procedures:   None  Antimicrobials:  Zosyn    Subjective: Feels better than yesterday with regard to fatigue. Dark stool earlier this morning.  Objective: Vitals:   10/30/18 1258 10/30/18 1407 10/30/18 2059 10/31/18 0603  BP: 131/87 133/81 120/82 (!) 140/92  Pulse: 87 87 90 78  Resp: '16 18 16 18  ' Temp: (!) 97.5 F (36.4 C) 98.6 F (37 C) 98 F (36.7 C) 97.7 F (36.5 C)  TempSrc: Oral Oral Oral Oral  SpO2: 97% 98% 94% 99%  Weight:      Height:        Intake/Output Summary (Last 24 hours) at 10/31/2018 1024 Last data filed at 10/30/2018 1729 Gross per 24 hour  Intake 1300.67 ml  Output 300 ml  Net 1000.67 ml   Filed Weights   10/29/18 2121 10/30/18 0205  Weight: 85.7 kg 87 kg    Examination:  General exam: Appears calm and comfortable Respiratory system: Clear to auscultation. Respiratory effort normal. Cardiovascular system: S1 & S2 heard, RRR. No murmurs, rubs, gallops or clicks. Gastrointestinal system: Abdomen is nondistended, soft and nontender. No organomegaly or masses felt. Normal bowel sounds heard. Central nervous system: Alert and oriented. No focal neurological deficits.  Extremities: No edema. No calf tenderness Skin: No cyanosis. No rashes Psychiatry: Judgement and insight appear normal. Mood & affect appropriate.     Data Reviewed: I have personally reviewed following labs and imaging studies   CBC: Recent Labs  Lab 10/29/18 1411  10/29/18 2152 10/29/18 2207 10/30/18 0049 10/30/18 0616 10/30/18 1547 10/30/18 2206  WBC 2.6*  --  2.9*  --  3.4* 2.6* 3.0* 2.6*  NEUTROABS 1.8  --  2.4  --   --   --   --   --   HGB 7.5*   < > 8.0* 7.8* 7.5* 6.8* 7.3* 6.9*  HCT 23.7*  --  25.7* 23.0* 23.7* 22.4* 23.0* 21.7*  MCV 87.1  --  89.9  --  89.4 91.1 88.5 89.3  PLT 7*  --  48*  --  47* 44* 39* 33*   < > = values in this interval not displayed.   Basic Metabolic Panel: Recent Labs  Lab 10/29/18 1411 10/29/18 2152 10/29/18 2207 10/29/18 2255 10/30/18 0616  NA 139 137 139  --  137  K 4.0 2.4* 2.5*  --  4.9  CL 104 105  --   --  109  CO2 25 23  --   --  23  GLUCOSE 121* 156*  --   --  94  BUN 31* 33*  --   --  30*  CREATININE 2.25* 2.31*  --   --  2.09*  CALCIUM 9.1 8.5*  --   --  8.2*  MG 1.9  --   --  1.8  --    GFR: Estimated Creatinine Clearance: 29.2 mL/min (A) (by C-G formula based on SCr of 2.09 mg/dL (H)). Liver Function Tests: Recent Labs  Lab 10/29/18 1411 10/29/18 2152  AST 11* 11*  ALT 7 9  ALKPHOS 91 83  BILITOT 0.2* 0.5  PROT 6.3* 5.8*  ALBUMIN 3.5 3.4*   No results for input(s): LIPASE, AMYLASE in the last 168 hours. No results for input(s): AMMONIA in the last 168 hours. Coagulation Profile: Recent Labs  Lab 10/29/18 2152  INR 0.9   Cardiac Enzymes: No results for input(s): CKTOTAL, CKMB, CKMBINDEX, TROPONINI in the last 168 hours. BNP (last 3 results) No results for input(s): PROBNP in the last 8760 hours. HbA1C: No results for input(s): HGBA1C in the last 72 hours. CBG: Recent Labs  Lab 10/30/18 0756 10/31/18 0743  GLUCAP 89 112*   Lipid Profile: No results for input(s): CHOL, HDL, LDLCALC, TRIG, CHOLHDL, LDLDIRECT in the last 72 hours. Thyroid Function Tests: No results for input(s): TSH, T4TOTAL, FREET4, T3FREE, THYROIDAB in the last 72 hours. Anemia Panel: No results for input(s): VITAMINB12, FOLATE, FERRITIN, TIBC, IRON,  RETICCTPCT in the last 72 hours. Sepsis Labs: Recent Labs  Lab 10/29/18 2152 10/30/18 0049 10/30/18 0616  PROCALCITON  --  0.24  --   LATICACIDVEN 1.2  --  0.9    Recent Results (from the past 240 hour(s))  Culture, blood (routine x 2)     Status: None (Preliminary result)   Collection Time: 10/29/18  9:52 PM  Result Value Ref Range Status   Specimen Description   Final    BLOOD PORTA CATH Performed at [facility], [place] [address] [place], [place] [address]    Special Requests   Final    BOTTLES DRAWN AEROBIC AND ANAEROBIC Blood Culture adequate volume Performed at [facility] [address] [place], [place] [address]    Culture  Final    NO GROWTH < 24 HOURS Performed at [facility], [place] [address], [place], [place] [address]    Report Status PENDING  Incomplete  Culture, blood (routine x 2)     Status: None (Preliminary result)   Collection Time: 10/29/18 10:25 PM  Result Value Ref Range Status   Specimen Description   Final    BLOOD RIGHT HAND Performed at [facility] [address] [place], [place] [address]    Special Requests   Final    BOTTLES DRAWN AEROBIC ONLY Blood Culture adequate volume Performed at [facility] [address] [place], [place] [address]    Culture   Final    NO GROWTH < 24 HOURS Performed at [facility] [address] [place], [place] [address]    Report Status PENDING  Incomplete         Radiology Studies: Ct Abdomen Pelvis Wo Contrast  Result Date: 10/29/2018 CLINICAL DATA:  Left-sided flank pain, history of lung carcinoma EXAM: CT ABDOMEN AND PELVIS WITHOUT CONTRAST TECHNIQUE: Multidetector CT imaging of the abdomen and pelvis was performed following the standard protocol without IV contrast. COMPARISON:  08/30/2018 FINDINGS: Lower chest: Lung bases demonstrate a right lower lobe mass lesion which now measures 2.9 cm in greatest dimension. This  has decreased in size when compared with the prior PET-CT from 4.6 cm. This is consistent with the patient's given clinical history of lung carcinoma and recent treatment. Hepatobiliary: No focal liver abnormality is seen. No gallstones, gallbladder wall thickening, or biliary dilatation. Pancreas: Unremarkable. No pancreatic ductal dilatation or surrounding inflammatory changes. Spleen: Normal in size without focal abnormality. Adrenals/Urinary Tract: Adrenal glands are within normal limits. The kidneys are within normal limits. No renal calculi or obstructive changes are noted. The bladder is decompressed. Stomach/Bowel: Appendix is well visualized and within normal limits. Very mild pericolonic inflammatory changes are noted in the sigmoid colon. Small bowels within normal limits. Stomach is unremarkable. Vascular/Lymphatic: Aortic atherosclerosis. No enlarged abdominal or pelvic lymph nodes. Reproductive: Uterus and bilateral adnexa are unremarkable. Other: Mild free fluid is noted within the pelvis likely of a reactive nature. Musculoskeletal: Degenerative changes of lumbar spine are noted. IMPRESSION: Decrease in size in right lower lobe mass consistent with previous treatment. Mild pericolonic inflammatory changes likely of an infectious etiology. No abscess or perforation is identified. These changes are concentrated predominately within the sigmoid. Electronically Signed   By: Inez Catalina M.D.   On: 10/29/2018 23:28   Dg Chest Port 1 View  Result Date: 10/29/2018 CLINICAL DATA:  Hypertension and history of lung carcinoma EXAM: PORTABLE CHEST 1 VIEW COMPARISON:  10/11/2018 FINDINGS: Cardiac shadows within normal limits. Right-sided chest wall port is noted with catheter tip at the cavoatrial junction. Lungs are well aerated bilaterally. Persistent right lower lobe mass lesion is noted stable from the recent CT examination. Stable right paratracheal prominence is noted consistent with lymphadenopathy. No  bony abnormality is noted. IMPRESSION: Stable lung mass on the right. No new focal abnormality is seen. Electronically Signed   By: Inez Catalina M.D.   On: 10/29/2018 21:45        Scheduled Meds: . sodium chloride   Intravenous Once  . atorvastatin  80 mg Oral q1800  . feeding supplement (ENSURE ENLIVE)  237 mL Oral BID BM  . ferrous sulfate  325 mg Oral Q breakfast  . magnesium oxide  400 mg Oral QID  . [START ON  11/02/2018] pantoprazole  40 mg Intravenous Q12H  . sucralfate  1 g Oral TID WC & HS   Continuous Infusions: . sodium chloride 125 mL/hr at 10/31/18 0009  . pantoprozole (PROTONIX) infusion 8 mg/hr (10/31/18 0808)  . piperacillin-tazobactam (ZOSYN)  IV 3.375 g (10/31/18 0640)     LOS: 0 days     Cordelia Poche, MD [facility] 10/31/2018, 10:24 AM  If 7PM-7AM, please contact night-coverage [email or web address]

## 2018-10-31 NOTE — Progress Notes (Signed)
[facility] Progress Note  Subjective: Patient has no specific complaints today. Stools are still dark in color.  Objective: Vital signs in last 24 hours: Temp:  [97.7 F (36.5 C)-98.6 F (37 C)] 98.1 F (36.7 C) (04/16 1353) Pulse Rate:  [78-90] 83 (04/16 1353) Resp:  [16-18] 16 (04/16 1353) BP: (120-140)/(81-92) 132/86 (04/16 1353) SpO2:  [94 %-99 %] 98 % (04/16 1353) Weight change:    PE:  No distress  Lab Results: Results for orders placed or performed during the hospital encounter of 10/29/18 (from the past 24 hour(s))  CBC     Status: Abnormal   Collection Time: 10/30/18  3:47 PM  Result Value Ref Range   WBC 3.0 (L) 4.0 - 10.5 K/uL   RBC 2.60 (L) 3.87 - 5.11 MIL/uL   Hemoglobin 7.3 (L) 12.0 - 15.0 g/dL   HCT 23.0 (L) 36.0 - 46.0 %   MCV 88.5 80.0 - 100.0 fL   MCH 28.1 26.0 - 34.0 pg   MCHC 31.7 30.0 - 36.0 g/dL   RDW 20.0 (H) 11.5 - 15.5 %   Platelets 39 (L) 150 - 400 K/uL   nRBC 0.0 0.0 - 0.2 %  CBC     Status: Abnormal   Collection Time: 10/30/18 10:06 PM  Result Value Ref Range   WBC 2.6 (L) 4.0 - 10.5 K/uL   RBC 2.43 (L) 3.87 - 5.11 MIL/uL   Hemoglobin 6.9 (LL) 12.0 - 15.0 g/dL   HCT 21.7 (L) 36.0 - 46.0 %   MCV 89.3 80.0 - 100.0 fL   MCH 28.4 26.0 - 34.0 pg   MCHC 31.8 30.0 - 36.0 g/dL   RDW 20.5 (H) 11.5 - 15.5 %   Platelets 33 (L) 150 - 400 K/uL   nRBC 0.0 0.0 - 0.2 %  Prepare RBC     Status: None   Collection Time: 10/31/18  1:08 AM  Result Value Ref Range   Order Confirmation      ORDER PROCESSED BY BLOOD BANK Performed at [facility], [place] [address], [place], [place] [address]   Glucose, capillary     Status: Abnormal   Collection Time: 10/31/18  7:43 AM  Result Value Ref Range   Glucose-Capillary 112 (H) 70 - 99 mg/dL   Comment 1 Notify RN    Comment 2 Document in Chart   CBC     Status: Abnormal   Collection Time: 10/31/18  9:49 AM  Result Value Ref Range   WBC 2.8 (L) 4.0 - 10.5 K/uL   RBC 2.47 (L)  3.87 - 5.11 MIL/uL   Hemoglobin 7.0 (L) 12.0 - 15.0 g/dL   HCT 22.0 (L) 36.0 - 46.0 %   MCV 89.1 80.0 - 100.0 fL   MCH 28.3 26.0 - 34.0 pg   MCHC 31.8 30.0 - 36.0 g/dL   RDW 20.5 (H) 11.5 - 15.5 %   Platelets 32 (L) 150 - 400 K/uL   nRBC 0.0 0.0 - 0.2 %  Basic metabolic panel     Status: Abnormal   Collection Time: 10/31/18  9:49 AM  Result Value Ref Range   Sodium 140 135 - 145 mmol/L   Potassium 3.9 3.5 - 5.1 mmol/L   Chloride 112 (H) 98 - 111 mmol/L   CO2 22 22 - 32 mmol/L   Glucose, Bld 92 70 - 99 mg/dL   BUN 21 8 - 23 mg/dL   Creatinine, Ser 1.95 (H) 0.44 - 1.00 mg/dL   Calcium  8.4 (L) 8.9 - 10.3 mg/dL   GFR calc non Af Amer 26 (L) >60 mL/min   GFR calc Af Amer 31 (L) >60 mL/min   Anion gap 6 5 - 15  Lactate dehydrogenase     Status: None   Collection Time: 10/31/18  9:49 AM  Result Value Ref Range   LDH 117 98 - 192 U/L  Differential     Status: Abnormal   Collection Time: 10/31/18  9:49 AM  Result Value Ref Range   Neutrophils Relative % 77 %   Neutro Abs 2.2 1.7 - 7.7 K/uL   Lymphocytes Relative 12 %   Lymphs Abs 0.3 (L) 0.7 - 4.0 K/uL   Monocytes Relative 10 %   Monocytes Absolute 0.3 0.1 - 1.0 K/uL   Eosinophils Relative 1 %   Eosinophils Absolute 0.0 0.0 - 0.5 K/uL   Basophils Relative 0 %   Basophils Absolute 0.0 0.0 - 0.1 K/uL   WBC Morphology MILD LEFT SHIFT (1-5% METAS, OCC MYELO, OCC BANDS)   Occult blood card to lab, stool RN will collect     Status: None   Collection Time: 10/31/18 10:25 AM  Result Value Ref Range   Fecal Occult Bld NEGATIVE NEGATIVE    Studies/Results: No results found.    Assessment: Symptomatic anemia from gastrointestinal blood loss  History of esophageal ulcer, gastric ulcer, duodenal ulcer from EGD on June 19, 2018  Small cell lung cancer  Thrombocytopenia    Plan:   Continue supportive care. PPI therapy. Continued oozing of blood may be aggravated by thrombocytopenia. No plans for repeat EGD unless  therapeutic intervention required to stop active bleeding.    SAM F Yaritzy Huser 10/31/2018, 1:58 PM  Pager: [phone] If no answer or after 5 PM call [phone]

## 2018-10-31 NOTE — Telephone Encounter (Signed)
Unable to reach pt - vmail full - appts r/s per 4/16 sch message - pt to get updated schedule with discharge papers.

## 2018-10-31 NOTE — Progress Notes (Addendum)
Initial Nutrition Assessment  INTERVENTION:   Continue Ensure Enlive po BID, each supplement provides 350 kcal and 20 grams of protein  NUTRITION DIAGNOSIS:   Increased nutrient needs related to cancer and cancer related treatments as evidenced by estimated needs.  GOAL:   Patient will meet greater than or equal to 90% of their needs  MONITOR:   PO intake, Supplement acceptance, Labs, Weight trends, I & O's, Diet advancement  REASON FOR ASSESSMENT:   Malnutrition Screening Tool    ASSESSMENT:   68 y.o. female with a history of PUD, hypertension, hyperlipidemia, stroke, GERD, small cell lung cancer on radiation and chemotherapy, iron deficiency anemia, upper GI bleeding, CKD 3. Patient presented secondary to weakness, nausea/vomiting, diarrhea and black stool, concerning for upper GI bleeding.  06/19/18:s/p EGD which showed esophageal, gastric and duodenal ulcers 10/11/18: s/p flex sigmoidoscopy: rectal polyps found  **RD working remotely**  Per chart review, pt was diagnosed with small cell lung cancer in January 2020. Pt was started on chemotherapy and radiation, last treatment was 4/1.   Patient reports N/V and diarrhea PTA. Pt continues to have nausea today, no vomiting. Pt has consumed mainly tomato soup today, still only on full liquids. Pt has had poor appetite and intakes since beginning cancer treatments. Radiation therapy has caused some painful swallowing.  Ensure supplements have been ordered.  Per weight records, pt has lost 23 lb since December 2019 (10% wt loss x 4.5 months, significant for time frame).   Medications: Ferrous sulfate tablet daily, MAG-OX tablet QID, Zofran tablet PRN Labs reviewed: CBGs: 112 GFR: 26   NUTRITION - FOCUSED PHYSICAL EXAM:  Unable to perform per department requirements to work remotely.  Diet Order:   Diet Order            Diet full liquid Room service appropriate? Yes; Fluid consistency: Thin  Diet effective now               EDUCATION NEEDS:   No education needs have been identified at this time  Skin:  Skin Assessment: Reviewed RN Assessment  Last BM:  4/14  Height:   Ht Readings from Last 1 Encounters:  10/29/18 5\' 5"  (1.651 m)    Weight:   Wt Readings from Last 1 Encounters:  10/30/18 87 kg    Ideal Body Weight:  56.8 kg  BMI:  Body mass index is 31.92 kg/m.  Estimated Nutritional Needs:   Kcal:  1900-2100  Protein:  80-90g  Fluid:  2L/day  Clayton Bibles, MS, RD, LDN [place] Diet[facility] Pager: [phone] After Hours Pager: [phone]

## 2018-11-01 ENCOUNTER — Ambulatory Visit: Payer: Medicare Other

## 2018-11-01 DIAGNOSIS — Z801 Family history of malignant neoplasm of trachea, bronchus and lung: Secondary | ICD-10-CM | POA: Diagnosis not present

## 2018-11-01 DIAGNOSIS — R112 Nausea with vomiting, unspecified: Secondary | ICD-10-CM

## 2018-11-01 DIAGNOSIS — Z8673 Personal history of transient ischemic attack (TIA), and cerebral infarction without residual deficits: Secondary | ICD-10-CM | POA: Diagnosis not present

## 2018-11-01 DIAGNOSIS — I10 Essential (primary) hypertension: Secondary | ICD-10-CM | POA: Diagnosis not present

## 2018-11-01 DIAGNOSIS — K529 Noninfective gastroenteritis and colitis, unspecified: Secondary | ICD-10-CM | POA: Diagnosis present

## 2018-11-01 DIAGNOSIS — Z8711 Personal history of peptic ulcer disease: Secondary | ICD-10-CM | POA: Diagnosis not present

## 2018-11-01 DIAGNOSIS — R197 Diarrhea, unspecified: Secondary | ICD-10-CM

## 2018-11-01 DIAGNOSIS — D61818 Other pancytopenia: Secondary | ICD-10-CM | POA: Diagnosis present

## 2018-11-01 DIAGNOSIS — K269 Duodenal ulcer, unspecified as acute or chronic, without hemorrhage or perforation: Secondary | ICD-10-CM | POA: Diagnosis present

## 2018-11-01 DIAGNOSIS — G8929 Other chronic pain: Secondary | ICD-10-CM | POA: Diagnosis present

## 2018-11-01 DIAGNOSIS — K259 Gastric ulcer, unspecified as acute or chronic, without hemorrhage or perforation: Secondary | ICD-10-CM | POA: Diagnosis present

## 2018-11-01 DIAGNOSIS — R51 Headache: Secondary | ICD-10-CM | POA: Diagnosis not present

## 2018-11-01 DIAGNOSIS — Z87891 Personal history of nicotine dependence: Secondary | ICD-10-CM | POA: Diagnosis not present

## 2018-11-01 DIAGNOSIS — N183 Chronic kidney disease, stage 3 (moderate): Secondary | ICD-10-CM | POA: Diagnosis present

## 2018-11-01 DIAGNOSIS — Z8249 Family history of ischemic heart disease and other diseases of the circulatory system: Secondary | ICD-10-CM | POA: Diagnosis not present

## 2018-11-01 DIAGNOSIS — E785 Hyperlipidemia, unspecified: Secondary | ICD-10-CM | POA: Diagnosis present

## 2018-11-01 DIAGNOSIS — N179 Acute kidney failure, unspecified: Secondary | ICD-10-CM | POA: Diagnosis present

## 2018-11-01 DIAGNOSIS — Z7982 Long term (current) use of aspirin: Secondary | ICD-10-CM | POA: Diagnosis not present

## 2018-11-01 DIAGNOSIS — D649 Anemia, unspecified: Secondary | ICD-10-CM | POA: Diagnosis not present

## 2018-11-01 DIAGNOSIS — I959 Hypotension, unspecified: Secondary | ICD-10-CM | POA: Diagnosis present

## 2018-11-01 DIAGNOSIS — K649 Unspecified hemorrhoids: Secondary | ICD-10-CM | POA: Diagnosis present

## 2018-11-01 DIAGNOSIS — Z79899 Other long term (current) drug therapy: Secondary | ICD-10-CM | POA: Diagnosis not present

## 2018-11-01 DIAGNOSIS — C3431 Malignant neoplasm of lower lobe, right bronchus or lung: Secondary | ICD-10-CM | POA: Diagnosis present

## 2018-11-01 DIAGNOSIS — I129 Hypertensive chronic kidney disease with stage 1 through stage 4 chronic kidney disease, or unspecified chronic kidney disease: Secondary | ICD-10-CM | POA: Diagnosis present

## 2018-11-01 DIAGNOSIS — E876 Hypokalemia: Secondary | ICD-10-CM | POA: Diagnosis present

## 2018-11-01 DIAGNOSIS — T451X5A Adverse effect of antineoplastic and immunosuppressive drugs, initial encounter: Secondary | ICD-10-CM | POA: Diagnosis present

## 2018-11-01 DIAGNOSIS — D500 Iron deficiency anemia secondary to blood loss (chronic): Secondary | ICD-10-CM | POA: Diagnosis present

## 2018-11-01 LAB — BPAM RBC
Blood Product Expiration Date: 202004172359
Blood Product Expiration Date: 202004172359
ISSUE DATE / TIME: 202004151042
ISSUE DATE / TIME: 202004162142
Unit Type and Rh: 6200
Unit Type and Rh: 6200

## 2018-11-01 LAB — HEMOGLOBIN AND HEMATOCRIT, BLOOD
HCT: 25.3 % — ABNORMAL LOW (ref 36.0–46.0)
Hemoglobin: 8.1 g/dL — ABNORMAL LOW (ref 12.0–15.0)

## 2018-11-01 LAB — IRON AND TIBC
Iron: 131 ug/dL (ref 28–170)
Saturation Ratios: 49 % — ABNORMAL HIGH (ref 10.4–31.8)
TIBC: 268 ug/dL (ref 250–450)
UIBC: 137 ug/dL

## 2018-11-01 LAB — TYPE AND SCREEN
ABO/RH(D): A POS
Antibody Screen: NEGATIVE
Unit division: 0
Unit division: 0

## 2018-11-01 LAB — FOLATE: Folate: 4.8 ng/mL — ABNORMAL LOW (ref >5.9)

## 2018-11-01 LAB — RETICULOCYTES
Immature Retic Fract: 16.2 % — ABNORMAL HIGH (ref 2.3–15.9)
RBC.: 3.2 MIL/uL — ABNORMAL LOW (ref 3.87–5.11)
Retic Count, Absolute: 11.5 10*3/uL — ABNORMAL LOW (ref 19.0–186.0)
Retic Ct Pct: 0.4 % (ref 0.4–3.1)

## 2018-11-01 LAB — CREATININE, SERUM
Creatinine, Ser: 1.62 mg/dL — ABNORMAL HIGH (ref 0.44–1.00)
GFR calc Af Amer: 38 mL/min — ABNORMAL LOW (ref >60)
GFR calc non Af Amer: 33 mL/min — ABNORMAL LOW (ref >60)

## 2018-11-01 LAB — GLUCOSE, CAPILLARY: Glucose-Capillary: 91 mg/dL (ref 70–99)

## 2018-11-01 LAB — VITAMIN B12: Vitamin B-12: 6247 pg/mL — ABNORMAL HIGH (ref 180–914)

## 2018-11-01 LAB — FERRITIN: Ferritin: 439 ng/mL — ABNORMAL HIGH (ref 11–307)

## 2018-11-01 MED ORDER — MORPHINE SULFATE (PF) 2 MG/ML IV SOLN
2.0000 mg | INTRAVENOUS | Status: DC | PRN
  Administered 2018-11-01 – 2018-11-02 (×3): 2 mg via INTRAVENOUS
  Filled 2018-11-01 (×3): qty 1

## 2018-11-01 MED ORDER — HYDRALAZINE HCL 20 MG/ML IJ SOLN
10.0000 mg | Freq: Four times a day (QID) | INTRAMUSCULAR | Status: DC | PRN
  Administered 2018-11-01 – 2018-11-03 (×3): 10 mg via INTRAVENOUS
  Filled 2018-11-01 (×3): qty 1

## 2018-11-01 MED ORDER — SODIUM CHLORIDE 0.9 % IV SOLN
INTRAVENOUS | Status: DC | PRN
  Administered 2018-11-01: 250 mL via INTRAVENOUS

## 2018-11-01 MED ORDER — ONDANSETRON HCL 4 MG/2ML IJ SOLN
4.0000 mg | Freq: Four times a day (QID) | INTRAMUSCULAR | Status: DC | PRN
  Administered 2018-11-01 (×2): 4 mg via INTRAVENOUS
  Filled 2018-11-01 (×4): qty 2

## 2018-11-01 NOTE — Progress Notes (Signed)
   11/01/18 2122  Vitals  Temp 98.1 F (36.7 C)  Temp Source Oral  BP (!) 174/98  MAP (mmHg) 122  BP Location Left Arm  BP Method Automatic  Patient Position (if appropriate) Lying  Pulse Rate 93  Resp 18   Prn hydralazine 10mg  IVP administered.  2244 BP 149/85 Will continue to monitor.

## 2018-11-01 NOTE — Progress Notes (Signed)
PROGRESS NOTE    [name]  UMP:[id number] DOB: 1951-07-11 DOA: 10/29/2018 PCP: Rutherford Guys, MD   Brief Narrative:  HPI on 10/30/2018 by Dr. Ivor Costa [name] is a 68 y.o. female with medical history significant of PUD, hypertension, hyperlipidemia, stroke, GERD, small cell lung cancer on radiation and chemotherapy, iron deficiency anemia, upper GI bleeding, CKD-3, who presents with generalized weakness, nausea, vomiting, diarrhea and black stool.  Patient states that she has been having generalized weakness, malaise in the past several days, which has been progressively getting worse.  She also reports nausea and vomited once.  She has had 2 loose stool bowel movement today.  She denies abdominal pain, but on physical examination, she has mild abdominal tenderness in the central abdomen.  She states that she has had 2 times of black stool bowel movement. patient denies cough, chest pain, shortness of breath, runny nose or sore throat.  No risk exposure to COVID-19.  No symptoms of UTI or unilateral weakness.  Initially patient was hypotensive with blood pressure 87/64, which improved to 109/75 after received about 600 cc normal saline.  Interim history Admitted for symptomatic anemia with weakness, nausea and vomiting, dark stools.  Concern for upper GI bleed.  Gastroenterology consulted and appreciated.  Currently monitoring her hemoglobin.  Assessment & Plan   Symptomatic anemia/pancytopenia/Presumed GI bleed -Presented with weakness and dark stools -Globin did drop to 6.9, patient did receive 2 units PRBC during hospitalization thus far -Currently hemoglobin 8.1 -Gastroenterology consulted and appreciated, recommending to just monitor hemoglobin.  No endoscopic procedure at this time. -Continue to monitor CBC  Nausea/vomiting/diarrhea -CT scan significant for possible colitis -Suspect some of her symptoms may be chemotherapy/radiation related -Currently patient  is afebrile although she did meet sepsis criteria on admission-however no source of infection found -Continue Zosyn empirically, along with supportive care  Acute kidney injury on chronic kidney disease, stage III -Creatinine was 2.25 on admission, and has improved to 1.62 today -Continue to monitor BMP  Hypotension -Resolved continue to monitor BP  Hyperlipidemia -Continue statin  Essential hypertension -Lisinopril and chlorthalidone held in the setting of acute kidney injury along with hypotension  PUD -Continue Carafate, PPI  Hypomagnesemia -Chronic, patient receives magnesium oxide as an outpatient -Use cautiously given kidney disease -Magnesium may also increase patient's diarrhea  DVT Prophylaxis  SCDs  Code Status: Full  Family Communication: None at bedside  Disposition Plan: Admitted. Continue to monitor hemoglobin and treat symptoms.   Consultants Oncology Gastroenterology  Procedures  None   Antibiotics   Anti-infectives (From admission, onward)   Start     Dose/Rate Route Frequency Ordered Stop   10/30/18 0600  piperacillin-tazobactam (ZOSYN) IVPB 3.375 g     3.375 g 12.5 mL/hr over 240 Minutes Intravenous Every 8 hours 10/30/18 0029     10/29/18 2345  piperacillin-tazobactam (ZOSYN) IVPB 3.375 g     3.375 g 100 mL/hr over 30 Minutes Intravenous  Once 10/29/18 2340 10/30/18 0018      Subjective:   Francee Gentile seen and examined today.  Patient states that she is not doing well this morning and just vomited.  Continues to have nausea and diarrhea and feels very weak and tired.  Currently denies headache, chest pain, shortness of breath.  Objective:   Vitals:   10/31/18 2205 11/01/18 0040 11/01/18 0543 11/01/18 0732  BP: (!) 152/89 (!) 169/105 (!) 176/106 (!) 144/92  Pulse: 85 87 92   Resp: 18 18 18  Temp: 98.4 F (36.9 C) 98.4 F (36.9 C) 98.2 F (36.8 C)   TempSrc: Oral Oral Oral   SpO2: 95% 97% 97%   Weight:      Height:         Intake/Output Summary (Last 24 hours) at 11/01/2018 1100 Last data filed at 11/01/2018 0300 Gross per 24 hour  Intake 6324.99 ml  Output -  Net 6324.99 ml   Filed Weights   10/29/18 2121 10/30/18 0205  Weight: 85.7 kg 87 kg    Exam  General: Well developed, chronically ill-appearing, NAD  HEENT: NCAT, mucous membranes moist.   Neck: Supple  Cardiovascular: S1 S2 auscultated, RRR  Respiratory: Clear to auscultation bilaterally with equal chest rise  Abdomen: Soft, nontender, nondistended, + bowel sounds  Extremities: warm dry without cyanosis clubbing or edema  Neuro: AAOx3, nonfocal  Psych: Tearful, anxious   Data Reviewed: I have personally reviewed following labs and imaging studies  CBC: Recent Labs  Lab 10/29/18 1411 10/29/18 2152  10/30/18 0616 10/30/18 1547 10/30/18 2206 10/31/18 0949 10/31/18 1651 11/01/18 0357  WBC 2.6* 2.9*   < > 2.6* 3.0* 2.6* 2.8* 3.9*  --   NEUTROABS 1.8 2.4  --   --   --   --  2.2  --   --   HGB 7.5* 8.0*   < > 6.8* 7.3* 6.9* 7.0* 6.9* 8.1*  HCT 23.7* 25.7*   < > 22.4* 23.0* 21.7* 22.0* 21.6* 25.3*  MCV 87.1 89.9   < > 91.1 88.5 89.3 89.1 88.9  --   PLT 7* 48*   < > 44* 39* 33* 32* 33*  --    < > = values in this interval not displayed.   Basic Metabolic Panel: Recent Labs  Lab 10/29/18 1411 10/29/18 2152 10/29/18 2207 10/29/18 2255 10/30/18 0616 10/31/18 0949 11/01/18 0357  NA 139 137 139  --  137 140  --   K 4.0 2.4* 2.5*  --  4.9 3.9  --   CL 104 105  --   --  109 112*  --   CO2 25 23  --   --  23 22  --   GLUCOSE 121* 156*  --   --  94 92  --   BUN 31* 33*  --   --  30* 21  --   CREATININE 2.25* 2.31*  --   --  2.09* 1.95* 1.62*  CALCIUM 9.1 8.5*  --   --  8.2* 8.4*  --   MG 1.9  --   --  1.8  --   --   --    GFR: Estimated Creatinine Clearance: 37.7 mL/min (A) (by C-G formula based on SCr of 1.62 mg/dL (H)). Liver Function Tests: Recent Labs  Lab 10/29/18 1411 10/29/18 2152  AST 11* 11*  ALT 7 9   ALKPHOS 91 83  BILITOT 0.2* 0.5  PROT 6.3* 5.8*  ALBUMIN 3.5 3.4*   No results for input(s): LIPASE, AMYLASE in the last 168 hours. No results for input(s): AMMONIA in the last 168 hours. Coagulation Profile: Recent Labs  Lab 10/29/18 2152  INR 0.9   Cardiac Enzymes: No results for input(s): CKTOTAL, CKMB, CKMBINDEX, TROPONINI in the last 168 hours. BNP (last 3 results) No results for input(s): PROBNP in the last 8760 hours. HbA1C: No results for input(s): HGBA1C in the last 72 hours. CBG: Recent Labs  Lab 10/30/18 0756 10/31/18 0743 11/01/18 0750  GLUCAP 89 112* 91  Lipid Profile: No results for input(s): CHOL, HDL, LDLCALC, TRIG, CHOLHDL, LDLDIRECT in the last 72 hours. Thyroid Function Tests: No results for input(s): TSH, T4TOTAL, FREET4, T3FREE, THYROIDAB in the last 72 hours. Anemia Panel: Recent Labs    11/01/18 1028  RETICCTPCT 0.4   Urine analysis:    Component Value Date/Time   COLORURINE YELLOW 10/30/2018 1209   APPEARANCEUR HAZY (A) 10/30/2018 1209   LABSPEC 1.012 10/30/2018 1209   PHURINE 5.0 10/30/2018 1209   GLUCOSEU NEGATIVE 10/30/2018 1209   HGBUR NEGATIVE 10/30/2018 1209   [place] 10/30/2018 1209   BILIRUBINUR negative 06/21/2018 1057   KETONESUR NEGATIVE 10/30/2018 1209   PROTEINUR NEGATIVE 10/30/2018 1209   UROBILINOGEN 0.2 06/21/2018 1057   NITRITE NEGATIVE 10/30/2018 1209   LEUKOCYTESUR NEGATIVE 10/30/2018 1209   Sepsis Labs: @LABRCNTIP (procalcitonin:4,lacticidven:4)  ) Recent Results (from the past 240 hour(s))  Culture, blood (routine x 2)     Status: None (Preliminary result)   Collection Time: 10/29/18  9:52 PM  Result Value Ref Range Status   Specimen Description   Final    BLOOD PORTA CATH Performed at [facility], [place] [address], [place], [place] [address]    Special Requests   Final    BOTTLES DRAWN AEROBIC AND ANAEROBIC Blood Culture adequate volume Performed at [facility] [address] [place], [place] [address]    Culture   Final    NO GROWTH 3 DAYS Performed at [facility], [place] [address], [place], [place] [address]    Report Status PENDING  Incomplete  Culture, blood (routine x 2)     Status: None (Preliminary result)   Collection Time: 10/29/18 10:25 PM  Result Value Ref Range Status   Specimen Description   Final    BLOOD RIGHT HAND Performed at [facility] [address], [place], [place] [address]    Special Requests   Final    BOTTLES DRAWN AEROBIC ONLY Blood Culture adequate volume Performed at [facility] [address] [place], [place] [address]    Culture   Final    NO GROWTH 3 DAYS Performed at [facility], [place] [address], [place], [place] [address]    Report Status PENDING  Incomplete      Radiology Studies: No results found.   Scheduled Meds: . sodium chloride   Intravenous Once  . atorvastatin  80 mg Oral q1800  . feeding supplement (ENSURE ENLIVE)  237 mL Oral BID BM  . ferrous sulfate  325 mg Oral Q breakfast  . magnesium oxide  400 mg Oral QID  . [START ON 11/02/2018] pantoprazole  40 mg Intravenous Q12H  . sucralfate  1 g Oral TID WC & HS   Continuous Infusions: . sodium chloride 125 mL/hr at 11/01/18 0608  . sodium chloride Stopped (11/01/18 0104)  . piperacillin-tazobactam (ZOSYN)  IV 12.5 mL/hr at 11/01/18 0300     LOS: 0 days   Time Spent in minutes   30 minutes  Amarilis Belflower D.O. on 11/01/2018 at 11:00 AM  Between 7am to 7pm - Please see pager noted on [email or web address]  After 7pm go to [email or web address]  And look for the night coverage person covering for me after hours  [facility] Office  [phone]

## 2018-11-01 NOTE — Progress Notes (Signed)
Am meds currently being held due to patient feels that she is to sick to take oral meds. Will attempt again later.

## 2018-11-01 NOTE — Progress Notes (Signed)
The patient is seen again today because of admission to the hospital for symptomatic anemia from gastrointestinal blood loss. She has a history of esophageal ulcer gastric ulcer and duodenal ulcer from EGD done on June 19, 2018. She has small cell cancer of the lung on chemotherapy and radiation therapy and she has thrombocytopenia.  She is complaining of vomiting bilious material today. Also some diarrhea.  Hemoglobin and hematocrit today are 8.1 and 25.3 respectively.  As previously I have recommended PPI therapy. Supportive care. We would not plan EGD unless therapeutic intervention required to stop active bleeding. Nothing further to suggest at this point.  Call us if needed.

## 2018-11-01 NOTE — Progress Notes (Signed)
Pharmacy Antibiotic Note  [name] is a 68 y.o. female admitted on 10/29/2018 with possible colitis.  Pharmacy has been consulted for zosyn dosing.  Today, 11/01/2018 Day #3 full zosyn Afebrile  WBC 3.9 SCr 1.62 improved CrCl 37 ml/min  Plan: Continue Zosyn 3.375g IV q8h (4 hour infusion).  No further dosing adjustments needed, Pharmacy will sign off. Please reconsult if clinical situation warrants.  Height: 5\' 5"  (165.1 cm) Weight: 191 lb 12.8 oz (87 kg) IBW/kg (Calculated) : 57  Temp (24hrs), Avg:98.3 F (36.8 C), Min:98.1 F (36.7 C), Max:98.4 F (36.9 C)  Recent Labs  Lab 10/29/18 1411 10/29/18 2152  10/30/18 0616 10/30/18 1547 10/30/18 2206 10/31/18 0949 10/31/18 1651 11/01/18 0357  WBC 2.6* 2.9*   < > 2.6* 3.0* 2.6* 2.8* 3.9*  --   CREATININE 2.25* 2.31*  --  2.09*  --   --  1.95*  --  1.62*  LATICACIDVEN  --  1.2  --  0.9  --   --   --   --   --    < > = values in this interval not displayed.    Estimated Creatinine Clearance: 37.7 mL/min (A) (by C-G formula based on SCr of 1.62 mg/dL (H)).    No Known Allergies  Antimicrobials this admission: 4/14 zosyn >>   Dose adjustments this admission: None  Microbiology results:  BCx: ngtd   Thank you for allowing pharmacy to be a part of this patient's care.  Peggyann Juba, PharmD, BCPS Pager: [phone] 11/01/2018 9:51 AM

## 2018-11-02 LAB — BASIC METABOLIC PANEL
Anion gap: 8 (ref 5–15)
BUN: 12 mg/dL (ref 8–23)
CO2: 25 mmol/L (ref 22–32)
Calcium: 7.9 mg/dL — ABNORMAL LOW (ref 8.9–10.3)
Chloride: 106 mmol/L (ref 98–111)
Creatinine, Ser: 1.46 mg/dL — ABNORMAL HIGH (ref 0.44–1.00)
GFR calc Af Amer: 43 mL/min — ABNORMAL LOW (ref >60)
GFR calc non Af Amer: 37 mL/min — ABNORMAL LOW (ref >60)
Glucose, Bld: 99 mg/dL (ref 70–99)
Potassium: 2.9 mmol/L — ABNORMAL LOW (ref 3.5–5.1)
Sodium: 139 mmol/L (ref 135–145)

## 2018-11-02 LAB — MAGNESIUM: Magnesium: 1.1 mg/dL — ABNORMAL LOW (ref 1.7–2.4)

## 2018-11-02 LAB — GLUCOSE, CAPILLARY: Glucose-Capillary: 97 mg/dL (ref 70–99)

## 2018-11-02 LAB — HEMOGLOBIN AND HEMATOCRIT, BLOOD
HCT: 26.7 % — ABNORMAL LOW (ref 36.0–46.0)
Hemoglobin: 8.7 g/dL — ABNORMAL LOW (ref 12.0–15.0)

## 2018-11-02 MED ORDER — MAGNESIUM SULFATE 4 GM/100ML IV SOLN
4.0000 g | Freq: Once | INTRAVENOUS | Status: AC
  Administered 2018-11-02: 4 g via INTRAVENOUS
  Filled 2018-11-02: qty 100

## 2018-11-02 MED ORDER — LISINOPRIL 20 MG PO TABS
20.0000 mg | ORAL_TABLET | Freq: Every day | ORAL | Status: DC
  Administered 2018-11-02: 14:00:00 20 mg via ORAL
  Filled 2018-11-02: qty 1

## 2018-11-02 MED ORDER — POTASSIUM CHLORIDE CRYS ER 20 MEQ PO TBCR
40.0000 meq | EXTENDED_RELEASE_TABLET | Freq: Once | ORAL | Status: AC
  Administered 2018-11-02: 14:00:00 40 meq via ORAL
  Filled 2018-11-02 (×3): qty 2

## 2018-11-02 MED ORDER — BUTALBITAL-APAP-CAFFEINE 50-325-40 MG PO TABS
1.0000 | ORAL_TABLET | ORAL | Status: DC | PRN
  Administered 2018-11-02: 14:00:00 2 via ORAL
  Administered 2018-11-02 – 2018-11-03 (×3): 1 via ORAL
  Administered 2018-11-03: 2 via ORAL
  Filled 2018-11-02 (×5): qty 1
  Filled 2018-11-02: qty 2
  Filled 2018-11-02: qty 1

## 2018-11-02 MED ORDER — ONDANSETRON HCL 4 MG PO TABS
4.0000 mg | ORAL_TABLET | ORAL | Status: DC | PRN
  Administered 2018-11-02: 4 mg via ORAL
  Filled 2018-11-02 (×2): qty 1

## 2018-11-02 MED ORDER — HYDROXYZINE HCL 10 MG PO TABS
10.0000 mg | ORAL_TABLET | Freq: Three times a day (TID) | ORAL | Status: DC | PRN
  Filled 2018-11-02: qty 1

## 2018-11-02 MED ORDER — POTASSIUM CHLORIDE 10 MEQ/100ML IV SOLN
10.0000 meq | INTRAVENOUS | Status: AC
  Administered 2018-11-02 (×4): 10 meq via INTRAVENOUS
  Filled 2018-11-02 (×3): qty 100

## 2018-11-02 NOTE — Progress Notes (Signed)
PROGRESS NOTE    [name]  JIR:[id number] DOB: 02-Jun-1951 DOA: 10/29/2018 PCP: Rutherford Guys, MD   Brief Narrative:  HPI on 10/30/2018 by Dr. Ivor Costa [name] is a 68 y.o. female with medical history significant of PUD, hypertension, hyperlipidemia, stroke, GERD, small cell lung cancer on radiation and chemotherapy, iron deficiency anemia, upper GI bleeding, CKD-3, who presents with generalized weakness, nausea, vomiting, diarrhea and black stool.  Patient states that she has been having generalized weakness, malaise in the past several days, which has been progressively getting worse.  She also reports nausea and vomited once.  She has had 2 loose stool bowel movement today.  She denies abdominal pain, but on physical examination, she has mild abdominal tenderness in the central abdomen.  She states that she has had 2 times of black stool bowel movement. patient denies cough, chest pain, shortness of breath, runny nose or sore throat.  No risk exposure to COVID-19.  No symptoms of UTI or unilateral weakness.  Initially patient was hypotensive with blood pressure 87/64, which improved to 109/75 after received about 600 cc normal saline.  Interim history Admitted for symptomatic anemia with weakness, nausea and vomiting, dark stools.  Concern for upper GI bleed.  Gastroenterology consulted and appreciated.  Currently monitoring her hemoglobin.  Assessment & Plan   Symptomatic anemia/pancytopenia/Presumed GI bleed -Presented with weakness and dark stools -Globin did drop to 6.9, patient did receive 2 units PRBC during hospitalization thus far -Currently hemoglobin 8.7 (remaining stable) -Gastroenterology consulted and appreciated, recommending to just monitor hemoglobin.  No endoscopic procedure at this time. -Continue to monitor CBC  Nausea/vomiting/diarrhea -CT scan significant for possible colitis -Suspect some of her symptoms may be chemotherapy/radiation related  -Currently patient is afebrile although she did meet sepsis criteria on admission-however no source of infection found -was placed on zosyn, however will discontinue- possibly causing more nausea  Acute kidney injury on chronic kidney disease, stage III -Creatinine was 2.25 on admission, and has improved to 1.46 today -Continue to monitor BMP  Hypotension -Resolved, continue to monitor BP  Hyperlipidemia -Continue statin  Essential hypertension -Lisinopril and chlorthalidone initially due to acute kidney injury along with hypotension -will restart lisinopril today and monitor closely  PUD -Continue Carafate, PPI  Hypomagnesemia -Chronic, patient receives magnesium oxide as an outpatient -Use cautiously given kidney disease -Magnesium may also increase patient's diarrhea -will give IV supplementation today given that magnesium 1.1 today  Hypokalemia -likely due to GI losses -will replace and continue to monitor BMP  Headache -possibly due to dry-heaving and poor oral intake -will place on Fioricet   DVT Prophylaxis  SCDs  Code Status: Full  Family Communication: None at bedside. Spoke with Dominica Severin via phone.  Disposition Plan: Admitted. Continue to monitor hemoglobin and treat symptoms.   Consultants Oncology Gastroenterology  Procedures  None   Antibiotics   Anti-infectives (From admission, onward)   Start     Dose/Rate Route Frequency Ordered Stop   10/30/18 0600  piperacillin-tazobactam (ZOSYN) IVPB 3.375 g  Status:  Discontinued     3.375 g 12.5 mL/hr over 240 Minutes Intravenous Every 8 hours 10/30/18 0029 11/02/18 0819   10/29/18 2345  piperacillin-tazobactam (ZOSYN) IVPB 3.375 g     3.375 g 100 mL/hr over 30 Minutes Intravenous  Once 10/29/18 2340 10/30/18 0018      Subjective:   [name] seen and examined today.  Patient complains of headache, nausea, and dry heaving. Denies chest pain, shortness of breath,  vomiting, dizziness.  Objective:    Vitals:   11/01/18 2122 11/01/18 2244 11/02/18 0439 11/02/18 0443  BP: (!) 174/98 (!) 149/85 (!) 159/104 (!) 148/90  Pulse: 93 89 96   Resp: 18  18   Temp: 98.1 F (36.7 C)  98.5 F (36.9 C)   TempSrc: Oral  Oral   SpO2: 98%  96%   Weight:      Height:        Intake/Output Summary (Last 24 hours) at 11/02/2018 1036 Last data filed at 11/02/2018 0600 Gross per 24 hour  Intake 1613.15 ml  Output -  Net 1613.15 ml   Filed Weights   10/29/18 2121 10/30/18 0205  Weight: 85.7 kg 87 kg   Exam  General: Well developed, chronically ill-appearing, NAD  HEENT: NCAT, mucous membranes moist.   Neck: Supple  Cardiovascular: S1 S2 auscultated, RRR  Respiratory: Clear to auscultation bilaterally  Abdomen: Soft, nontender, nondistended, + bowel sounds  Extremities: warm dry without cyanosis clubbing or edema  Neuro: AAOx3, nonfocal  Psych: tearful, anxious  Data Reviewed: I have personally reviewed following labs and imaging studies  CBC: Recent Labs  Lab 10/29/18 1411 10/29/18 2152  10/30/18 0616 10/30/18 1547 10/30/18 2206 10/31/18 0949 10/31/18 1651 11/01/18 0357 11/02/18 0321  WBC 2.6* 2.9*   < > 2.6* 3.0* 2.6* 2.8* 3.9*  --   --   NEUTROABS 1.8 2.4  --   --   --   --  2.2  --   --   --   HGB 7.5* 8.0*   < > 6.8* 7.3* 6.9* 7.0* 6.9* 8.1* 8.7*  HCT 23.7* 25.7*   < > 22.4* 23.0* 21.7* 22.0* 21.6* 25.3* 26.7*  MCV 87.1 89.9   < > 91.1 88.5 89.3 89.1 88.9  --   --   PLT 7* 48*   < > 44* 39* 33* 32* 33*  --   --    < > = values in this interval not displayed.   Basic Metabolic Panel: Recent Labs  Lab 10/29/18 1411 10/29/18 2152 10/29/18 2207 10/29/18 2255 10/30/18 0616 10/31/18 0949 11/01/18 0357 11/02/18 0321  NA 139 137 139  --  137 140  --  139  K 4.0 2.4* 2.5*  --  4.9 3.9  --  2.9*  CL 104 105  --   --  109 112*  --  106  CO2 25 23  --   --  23 22  --  25  GLUCOSE 121* 156*  --   --  94 92  --  99  BUN 31* 33*  --   --  30* 21  --  12   CREATININE 2.25* 2.31*  --   --  2.09* 1.95* 1.62* 1.46*  CALCIUM 9.1 8.5*  --   --  8.2* 8.4*  --  7.9*  MG 1.9  --   --  1.8  --   --   --  1.1*   GFR: Estimated Creatinine Clearance: 41.8 mL/min (A) (by C-G formula based on SCr of 1.46 mg/dL (H)). Liver Function Tests: Recent Labs  Lab 10/29/18 1411 10/29/18 2152  AST 11* 11*  ALT 7 9  ALKPHOS 91 83  BILITOT 0.2* 0.5  PROT 6.3* 5.8*  ALBUMIN 3.5 3.4*   No results for input(s): LIPASE, AMYLASE in the last 168 hours. No results for input(s): AMMONIA in the last 168 hours. Coagulation Profile: Recent Labs  Lab 10/29/18 2152  INR 0.9  Cardiac Enzymes: No results for input(s): CKTOTAL, CKMB, CKMBINDEX, TROPONINI in the last 168 hours. BNP (last 3 results) No results for input(s): PROBNP in the last 8760 hours. HbA1C: No results for input(s): HGBA1C in the last 72 hours. CBG: Recent Labs  Lab 10/30/18 0756 10/31/18 0743 11/01/18 0750 11/02/18 0803  GLUCAP 89 112* 91 97   Lipid Profile: No results for input(s): CHOL, HDL, LDLCALC, TRIG, CHOLHDL, LDLDIRECT in the last 72 hours. Thyroid Function Tests: No results for input(s): TSH, T4TOTAL, FREET4, T3FREE, THYROIDAB in the last 72 hours. Anemia Panel: Recent Labs    11/01/18 1028  VITAMINB12 6,247*  FOLATE 4.8*  FERRITIN 439*  TIBC 268  IRON 131  RETICCTPCT 0.4   Urine analysis:    Component Value Date/Time   COLORURINE YELLOW 10/30/2018 1209   APPEARANCEUR HAZY (A) 10/30/2018 1209   LABSPEC 1.012 10/30/2018 1209   PHURINE 5.0 10/30/2018 1209   GLUCOSEU NEGATIVE 10/30/2018 1209   HGBUR NEGATIVE 10/30/2018 1209   [place] 10/30/2018 1209   BILIRUBINUR negative 06/21/2018 1057   KETONESUR NEGATIVE 10/30/2018 1209   PROTEINUR NEGATIVE 10/30/2018 1209   UROBILINOGEN 0.2 06/21/2018 1057   NITRITE NEGATIVE 10/30/2018 1209   LEUKOCYTESUR NEGATIVE 10/30/2018 1209   Sepsis Labs: @LABRCNTIP (procalcitonin:4,lacticidven:4)  ) Recent Results  (from the past 240 hour(s))  Culture, blood (routine x 2)     Status: None (Preliminary result)   Collection Time: 10/29/18  9:52 PM  Result Value Ref Range Status   Specimen Description   Final    BLOOD PORTA CATH Performed at [facility], [place] [address], [place], [place] [address]    Special Requests   Final    BOTTLES DRAWN AEROBIC AND ANAEROBIC Blood Culture adequate volume Performed at [facility] [address], [place], [place] [address]    Culture   Final    NO GROWTH 4 DAYS Performed at [facility], [place] [address], [place], [place] [address]    Report Status PENDING  Incomplete  Culture, blood (routine x 2)     Status: None (Preliminary result)   Collection Time: 10/29/18 10:25 PM  Result Value Ref Range Status   Specimen Description   Final    BLOOD RIGHT HAND Performed at [facility] [address], [place], [place] [address]    Special Requests   Final    BOTTLES DRAWN AEROBIC ONLY Blood Culture adequate volume Performed at [facility] [address] [place], [place] [address]    Culture   Final    NO GROWTH 4 DAYS Performed at [facility], [place] [address], [place], [place] [address]    Report Status PENDING  Incomplete      Radiology Studies: No results found.   Scheduled Meds: . sodium chloride   Intravenous Once  . atorvastatin  80 mg Oral q1800  . feeding supplement (ENSURE ENLIVE)  237 mL Oral BID BM  . ferrous sulfate  325 mg Oral Q breakfast  . lisinopril  20 mg Oral Daily  . magnesium oxide  400 mg Oral QID  . pantoprazole  40 mg Intravenous Q12H  . potassium chloride  40 mEq Oral Once  . sucralfate  1 g Oral TID WC & HS   Continuous Infusions: . sodium chloride 125 mL/hr at 11/02/18 0600  . sodium chloride Stopped (11/01/18 0104)  . magnesium sulfate 1 - 4 g bolus IVPB 4 g (11/02/18 0840)  LOS: 1 day   Time Spent in minutes   30 minutes  Haylin Camilli D.O. on 11/02/2018 at 10:36 AM  Between 7am to 7pm - Please see pager noted on [email or web address]  After 7pm go to [email or web address]  And look for the night coverage person covering for me after hours  [facility] Office  [phone]

## 2018-11-02 NOTE — Progress Notes (Signed)
Notified MD am lab K+ 2.9.

## 2018-11-03 LAB — BASIC METABOLIC PANEL
Anion gap: 7 (ref 5–15)
BUN: 10 mg/dL (ref 8–23)
CO2: 23 mmol/L (ref 22–32)
Calcium: 8.1 mg/dL — ABNORMAL LOW (ref 8.9–10.3)
Chloride: 108 mmol/L (ref 98–111)
Creatinine, Ser: 1.41 mg/dL — ABNORMAL HIGH (ref 0.44–1.00)
GFR calc Af Amer: 45 mL/min — ABNORMAL LOW (ref >60)
GFR calc non Af Amer: 39 mL/min — ABNORMAL LOW (ref >60)
Glucose, Bld: 96 mg/dL (ref 70–99)
Potassium: 3.1 mmol/L — ABNORMAL LOW (ref 3.5–5.1)
Sodium: 138 mmol/L (ref 135–145)

## 2018-11-03 LAB — HEMOGLOBIN AND HEMATOCRIT, BLOOD
HCT: 25.4 % — ABNORMAL LOW (ref 36.0–46.0)
Hemoglobin: 7.9 g/dL — ABNORMAL LOW (ref 12.0–15.0)

## 2018-11-03 LAB — CULTURE, BLOOD (ROUTINE X 2)
Culture: NO GROWTH
Culture: NO GROWTH
Special Requests: ADEQUATE
Special Requests: ADEQUATE

## 2018-11-03 LAB — GLUCOSE, CAPILLARY: Glucose-Capillary: 86 mg/dL (ref 70–99)

## 2018-11-03 LAB — MAGNESIUM: Magnesium: 2 mg/dL (ref 1.7–2.4)

## 2018-11-03 MED ORDER — ENSURE ENLIVE PO LIQD: 1.0000 | Freq: Two times a day (BID) | ORAL | 0 refills | Status: DC

## 2018-11-03 MED ORDER — POTASSIUM CHLORIDE 20 MEQ/15ML (10%) PO SOLN
40.0000 meq | Freq: Once | ORAL | Status: AC
  Administered 2018-11-03: 09:00:00 40 meq via ORAL
  Filled 2018-11-03: qty 30

## 2018-11-03 MED ORDER — HEPARIN SOD (PORK) LOCK FLUSH 100 UNIT/ML IV SOLN
500.0000 [IU] | INTRAVENOUS | Status: AC | PRN
  Administered 2018-11-03: 11:00:00 500 [IU]

## 2018-11-03 MED ORDER — POTASSIUM CHLORIDE CRYS ER 20 MEQ PO TBCR: 20.0000 meq | EXTENDED_RELEASE_TABLET | Freq: Every day | ORAL | 0 refills | Status: DC

## 2018-11-03 MED ORDER — ONDANSETRON HCL 4 MG PO TABS: 4.0000 mg | ORAL_TABLET | Freq: Three times a day (TID) | ORAL | 0 refills | Status: DC | PRN

## 2018-11-03 MED ORDER — BUTALBITAL-APAP-CAFFEINE 50-325-40 MG PO TABS: 1.0000 | ORAL_TABLET | Freq: Four times a day (QID) | ORAL | 0 refills | Status: DC | PRN

## 2018-11-03 NOTE — TOC Initial Note (Signed)
Transition of Care [facility]) - Initial/Assessment Note    Patient Details  Name: [name] MRN: [id number] Date of Birth: 04-Jun-1951  [facility]) CM/SW Contact:    Erenest Rasher, RN Phone Number: 11/03/2018, 11:34 AM  Clinical Narrative:                 Spoke to pt and states she has appt with her PCP on 11/13/2018. Has wheelchair and bathroom is handicap accessible.   Expected Discharge Plan: Home/Self Care Barriers to Discharge: No Barriers Identified   Patient Goals and CMS Choice Patient states their goals for this hospitalization and ongoing recovery are:: go home      Expected Discharge Plan and Services Expected Discharge Plan: Home/Self Care   Discharge Planning Services: CM Consult     Expected Discharge Date: 11/03/18                        Prior Living Arrangements/Services   Lives with:: Significant Other Patient language and need for interpreter reviewed:: Yes Do you feel safe going back to the place where you live?: Yes      Need for Family Participation in Patient Care: No (Comment) Care giver support system in place?: No (comment)   Criminal Activity/Legal Involvement Pertinent to Current Situation/Hospitalization: No - Comment as needed  Activities of Daily Living Home Assistive Devices/Equipment: None, Shower chair with back, Wheelchair, Dentures (specify type) ADL Screening (condition at time of admission) Patient's cognitive ability adequate to safely complete daily activities?: Yes Is the patient deaf or have difficulty hearing?: No Does the patient have difficulty seeing, even when wearing glasses/contacts?: No Does the patient have difficulty concentrating, remembering, or making decisions?: No Patient able to express need for assistance with ADLs?: Yes Does the patient have difficulty dressing or bathing?: No Independently performs ADLs?: Yes (appropriate for developmental age) Does the patient have difficulty  walking or climbing stairs?: No Weakness of Legs: Both Weakness of Arms/Hands: Both  Permission Sought/Granted Permission sought to share information with : Case Manager Permission granted to share information with : Yes, Verbal Permission Granted  Share Information with NAME: Dominica Severin     Permission granted to share info w Relationship: boyfriend     Emotional Assessment Appearance:: Appears stated age Attitude/Demeanor/Rapport: Engaged Affect (typically observed): Accepting Orientation: : Oriented to Self, Oriented to Place, Oriented to  Time, Oriented to Situation   Psych Involvement: No (comment)  Admission diagnosis:  Hypokalemia [E87.6] Colitis [K52.9] Hypotensive episode [I95.9] Patient Active Problem List   Diagnosis Date Noted  . GIB (gastrointestinal bleeding) 10/30/2018  . Symptomatic anemia 10/30/2018  . Hypotension 10/30/2018  . Hypokalemia 10/30/2018  . Acute renal failure superimposed on stage 3 chronic kidney disease ([place]) 10/30/2018  . Pancytopenia ([place]) 10/30/2018  . Nausea vomiting and diarrhea 10/30/2018  . PUD (peptic ulcer disease) 10/30/2018  . Sepsis ([place]) 10/30/2018  . Hypomagnesemia 09/24/2018  . Small cell carcinoma of lower lobe of right lung ([place]) 08/28/2018  . Encounter for antineoplastic chemotherapy 08/28/2018  . Goals of care, counseling/discussion 08/28/2018  . Right lower lobe lung mass   . Acute CVA (cerebrovascular accident) ([place]) 06/21/2018  . Acute blood loss anemia 06/21/2018  . Upper GI bleed 06/19/2018  . Psoriasis 12/14/2017  . Knee pain, right 12/14/2017  . BMI 32.0-32.9,adult 12/15/2011  . Underinsured 12/15/2011  . HLD (hyperlipidemia) 03/26/2007  . Essential hypertension 03/26/2007   PCP:  Rutherford Guys, MD Pharmacy:   [facility]  [place], [place]. AT [address]. [place] [address] Phone: [phone] Fax: [phone]     Social Determinants  of Health (SDOH) Interventions    Readmission Risk Interventions Readmission Risk Prevention Plan 11/03/2018  Transportation Screening Complete  Medication Review Press photographer) Complete  PCP or Specialist appointment within 3-5 days of discharge Complete  HRI or Home Care Consult Patient refused  SW Recovery Care/Counseling Consult Patient refused  Palliative Care Screening Not [place] Not Applicable  Some recent data might be hidden

## 2018-11-03 NOTE — Progress Notes (Signed)
Discharge instructions and medications reviewed with patient.  Voiced understanding.  All questions answered.  Portacath de-accessed per IV nurse.  Awaiting transportation.

## 2018-11-03 NOTE — Care Management Important Message (Addendum)
Important Message  Patient Details  Name: [name] MRN: [id number] Date of Birth: February 02, 1951   Medicare Important Message Given:  Yes  Signed copy placed on shadow chart.  Erenest Rasher, RN 11/03/2018, 11:33 AM

## 2018-11-03 NOTE — Discharge Summary (Signed)
Physician Discharge Summary  [name] IOX:[id number] DOB: 1951/03/02 DOA: 10/29/2018  PCP: Rutherford Guys, MD  Admit date: 10/29/2018 Discharge date: 11/03/2018  Time spent: 45 minutes  Recommendations for Outpatient Follow-up:  Patient will be discharged to home.  Patient will need to follow up with primary care provider within one week of discharge, repeat CBC and BMP, magnesium.  Follow up with oncology. Patient should continue medications as prescribed.  Patient should follow a soft diet.   Discharge Diagnoses:  Principal Problem:   Symptomatic anemia/ Presumed GI bleed Active Problems: Nausea/vomiting/diarrhea Acute kidney injury on chronic kidney disease, stage III Hypotension Hyperlipidemia Essential hypertension PUD Hypomagnesemia Hypokalemia Headache  Discharge Condition: Stable  Diet recommendation: soft  Filed Weights   10/29/18 2121 10/30/18 0205  Weight: 85.7 kg 87 kg    History of present illness:  on 10/30/2018 by Dr. Vallery Sa Lankfordis a 68 y.o.femalewith medical history significant ofPUD,hypertension, hyperlipidemia, stroke, GERD, small cell lung cancer on radiation and chemotherapy, iron deficiency anemia, upper GI bleeding, CKD-3, who presents with generalized weakness, nausea, vomiting, diarrhea and black stool.  Patient states that she has been having generalized weakness, malaise in the past several days, which has been progressively getting worse. She also reports nausea and vomited once.She has had 2 loose stool bowel movement today. She denies abdominal pain, but on physical examination, she has mild abdominal tenderness in the central abdomen. She states that she hashad 2 times of black stool bowel movement. patient denies cough, chest pain, shortness of breath, runny nose or sore throat. No risk exposure to COVID-19.No symptoms of UTI or unilateral weakness. Initially patient was hypotensive with blood pressure  87/64, which improved to 109/75 after received about 600 cc normal saline.  Hospital Course:  Symptomatic anemia/pancytopenia/Presumed GI bleed -Presented with weakness and dark stools -hemoglobin did drop to 6.9, patient did receive 2 units PRBC during hospitalization thus far -Currently hemoglobin remaining stable, 7.9 (slight drop from 4/18- however patient received many IV medications- magnesium and several runs of potassium- therefore likely dilutional component) -Gastroenterology consulted and appreciated, recommending to just monitor hemoglobin.  No endoscopic procedure at this time. -FOBT negative x 2 -Continue iron supplementation and Carafate  -Repeat CBC in one week  Nausea/vomiting/diarrhea -CT scan significant for possible colitis -Suspect some of her symptoms may be chemotherapy/radiation related -Currently patient is afebrile although she did meet sepsis criteria on admission-however no source of infection found -was placed on zosyn, however will discontinue- possibly causing more nausea  Acute kidney injury on chronic kidney disease, stage III -Creatinine was 2.25 on admission, and has improved to 1.41 today -repeat BMP in one week  Hypotension -Resolved  Hyperlipidemia -Continue statin  Essential hypertension -Lisinopril and chlorthalidone initially due to acute kidney injury along with hypotension -continue lisinopril   PUD -Continue Carafate, PPI  Hypomagnesemia -Chronic, patient receives magnesium oxide as an outpatient -Use cautiously given kidney disease -Magnesium may also increase patient's diarrhea -replaced- repeat magnesium in one week  Hypokalemia -likely due to GI losses and poor oral intake -will discharge patient with potassium supplementation  -replaced, repeat BMP in one week  Headache -Resolved, possibly due to dry-heaving and poor oral intake -placed on Fioricet- script also  given  Consultants Oncology Gastroenterology  Procedures  None   Discharge Exam: Vitals:   11/02/18 2100 11/03/18 0519  BP: 130/85 (!) 167/97  Pulse: 78 76  Resp: 14 16  Temp: 98.4 F (36.9 C) 97.8 F (36.6 C)  SpO2:  98% 100%     General: Well developed, chronically ill appearing, NAD  HEENT: NCAT, mucous membranes moist.  Cardiovascular: S1 S2 auscultated, RRR  Respiratory: Clear to auscultation bilaterally with equal chest rise  Abdomen: Soft, nontender, nondistended, + bowel sounds  Extremities: warm dry without cyanosis clubbing or edema  Neuro: AAOx3, nonfocal  Psych: Pleasant, appropriate mood and affect  Discharge Instructions Discharge Instructions    Discharge instructions   Complete by:  As directed    Patient will be discharged to home.  Patient will need to follow up with primary care provider within one week of discharge, repeat CBC and BMP, magnesium.  Follow up with oncology. Patient should continue medications as prescribed.  Patient should follow a soft diet.     Allergies as of 11/03/2018   No Known Allergies     Medication List    STOP taking these medications   aspirin 325 MG EC tablet   chlorthalidone 25 MG tablet Commonly known as:  HYGROTON     TAKE these medications   acetaminophen 650 MG CR tablet Commonly known as:  TYLENOL Take 650 mg by mouth every 8 (eight) hours as needed for pain.   atorvastatin 80 MG tablet Commonly known as:  LIPITOR Take 1 tablet (80 mg total) by mouth daily at 6 PM.   butalbital-acetaminophen-caffeine 50-325-40 MG tablet Commonly known as:  FIORICET Take 1-2 tablets by mouth every 6 (six) hours as needed for headache.   feeding supplement (ENSURE ENLIVE) Liqd Take 237 mLs by mouth 2 (two) times daily between meals.   ferrous sulfate 325 (65 FE) MG tablet Take 325 mg by mouth daily with breakfast.   lidocaine-prilocaine cream Commonly known as:  EMLA Apply 1 application topically as  needed. What changed:  reasons to take this   lisinopril 40 MG tablet Commonly known as:  ZESTRIL TAKE 1 TABLET BY MOUTH EVERY DAY   magnesium oxide 400 MG tablet Commonly known as:  MAG-OX TAKE 1 TABLET BY MOUTH 3 TIMES A DAY What changed:  when to take this   ondansetron 4 MG tablet Commonly known as:  ZOFRAN Take 1 tablet (4 mg total) by mouth every 8 (eight) hours as needed for nausea or vomiting (give 2nd).   oxyCODONE-acetaminophen 5-325 MG tablet Commonly known as:  PERCOCET/ROXICET Take 1 tablet by mouth every 8 (eight) hours as needed for severe pain.   pantoprazole 40 MG tablet Commonly known as:  PROTONIX TAKE 1 TABLET (40 MG TOTAL) BY MOUTH 2 (TWO) TIMES DAILY BEFORE A MEAL.   potassium chloride SA 20 MEQ tablet Commonly known as:  K-DUR Take 1 tablet (20 mEq total) by mouth daily.   prochlorperazine 10 MG tablet Commonly known as:  COMPAZINE Take 1 tablet (10 mg total) by mouth every 6 (six) hours as needed for nausea or vomiting.   sucralfate 1 GM/10ML suspension Commonly known as:  Carafate Take 10 mLs (1 g total) by mouth 4 (four) times daily -  with meals and at bedtime.      No Known Allergies Follow-up Information    Rutherford Guys, MD. Schedule an appointment as soon as possible for a visit in 1 week(s).   Specialty:  Family Medicine Why:  Hospital follow up Contact information: [address] [name] [address] [phone]            The results of significant diagnostics from this hospitalization (including imaging, microbiology, ancillary and laboratory) are listed below for reference.  Significant Diagnostic Studies: Ct Abdomen Pelvis Wo Contrast  Result Date: 10/29/2018 CLINICAL DATA:  Left-sided flank pain, history of lung carcinoma EXAM: CT ABDOMEN AND PELVIS WITHOUT CONTRAST TECHNIQUE: Multidetector CT imaging of the abdomen and pelvis was performed following the standard protocol without IV contrast. COMPARISON:   08/30/2018 FINDINGS: Lower chest: Lung bases demonstrate a right lower lobe mass lesion which now measures 2.9 cm in greatest dimension. This has decreased in size when compared with the prior PET-CT from 4.6 cm. This is consistent with the patient's given clinical history of lung carcinoma and recent treatment. Hepatobiliary: No focal liver abnormality is seen. No gallstones, gallbladder wall thickening, or biliary dilatation. Pancreas: Unremarkable. No pancreatic ductal dilatation or surrounding inflammatory changes. Spleen: Normal in size without focal abnormality. Adrenals/Urinary Tract: Adrenal glands are within normal limits. The kidneys are within normal limits. No renal calculi or obstructive changes are noted. The bladder is decompressed. Stomach/Bowel: Appendix is well visualized and within normal limits. Very mild pericolonic inflammatory changes are noted in the sigmoid colon. Small bowels within normal limits. Stomach is unremarkable. Vascular/Lymphatic: Aortic atherosclerosis. No enlarged abdominal or pelvic lymph nodes. Reproductive: Uterus and bilateral adnexa are unremarkable. Other: Mild free fluid is noted within the pelvis likely of a reactive nature. Musculoskeletal: Degenerative changes of lumbar spine are noted. IMPRESSION: Decrease in size in right lower lobe mass consistent with previous treatment. Mild pericolonic inflammatory changes likely of an infectious etiology. No abscess or perforation is identified. These changes are concentrated predominately within the sigmoid. Electronically Signed   By: Inez Catalina M.D.   On: 10/29/2018 23:28   Ct Chest W Contrast  Result Date: 10/11/2018 CLINICAL DATA:  68 year old female with history of lung cancer undergoing ongoing chemotherapy and radiation therapy. 15 pounds of weight loss. EXAM: CT CHEST WITH CONTRAST TECHNIQUE: Multidetector CT imaging of the chest was performed during intravenous contrast administration. CONTRAST:  13mL OMNIPAQUE  IOHEXOL 300 MG/ML  SOLN COMPARISON:  Chest CT 08/09/2018.  PET-CT 08/30/2018. FINDINGS: Cardiovascular: Heart size is normal. There is no significant pericardial fluid, thickening or pericardial calcification. There is aortic atherosclerosis, as well as atherosclerosis of the great vessels of the mediastinum and the coronary arteries, including calcified atherosclerotic plaque in the left main, left anterior descending, left circumflex and right coronary arteries. Right internal jugular single-lumen porta cath with tip terminating in the right atrium. Mediastinum/Nodes: Multiple enlarged mediastinal and right hilar lymph nodes are again noted. These are generally decreased in size compared to the prior study, largest of which measures up to 3.9 cm in short axis (axial image 58 of series 2), decreased from 4.1 cm on prior PET-CT 08/30/2018. Prevascular lymph node anterior to the superior vena cava currently measures 13 mm in short axis (previously 2.1 cm). Subcarinal lymph node measuring 1.8 cm in short axis (axial image 72 of series 2) previously 2.3 cm. Largest right hilar lymph node currently measures 1.5 cm (axial image 87 of series 2). Esophagus is unremarkable in appearance. No axillary lymphadenopathy. Lungs/Pleura: Previously noted right lower lobe mass (axial image 98 of series 7) has decreased in size, currently measuring 3.1 x 3.3 cm. There are some adjacent postobstructive changes, predominantly atelectasis, in the right lower lobe. No other definite suspicious appearing pulmonary nodules or masses. No acute consolidative airspace disease. No pleural effusions. Upper Abdomen: Small left adrenal nodules measuring up to 1.3 cm in the lateral limb of the left adrenal gland, similar to prior studies (previously not hypermetabolic on PET-CT), nonspecific. Aortic atherosclerosis.  Musculoskeletal: There are no aggressive appearing lytic or blastic lesions noted in the visualized portions of the skeleton.  IMPRESSION: 1. Today's study demonstrates a positive response to therapy, with decreased size of right lower lobe mass as well as right hilar and mediastinal lymphadenopathy, as detailed above. 2. Stable small left adrenal nodules, previously not hypermetabolic on PET-CT. Continued attention on follow-up studies is recommended. 3. Aortic atherosclerosis, in addition to left main and 3 vessel coronary artery disease. Please note that although the presence of coronary artery calcium documents the presence of coronary artery disease, the severity of this disease and any potential stenosis cannot be assessed on this non-gated CT examination. Assessment for potential risk factor modification, dietary therapy or pharmacologic therapy may be warranted, if clinically indicated. Aortic Atherosclerosis (ICD10-I70.0). Electronically Signed   By: Vinnie Langton M.D.   On: 10/11/2018 11:03   Dg Chest Port 1 View  Result Date: 10/29/2018 CLINICAL DATA:  Hypertension and history of lung carcinoma EXAM: PORTABLE CHEST 1 VIEW COMPARISON:  10/11/2018 FINDINGS: Cardiac shadows within normal limits. Right-sided chest wall port is noted with catheter tip at the cavoatrial junction. Lungs are well aerated bilaterally. Persistent right lower lobe mass lesion is noted stable from the recent CT examination. Stable right paratracheal prominence is noted consistent with lymphadenopathy. No bony abnormality is noted. IMPRESSION: Stable lung mass on the right. No new focal abnormality is seen. Electronically Signed   By: Inez Catalina M.D.   On: 10/29/2018 21:45   Ir Imaging Guided Port Insertion  Result Date: 10/07/2018 INDICATION: 68 year old with small cell lung cancer and poor venous access. EXAM: FLUOROSCOPIC AND ULTRASOUND GUIDED PLACEMENT OF A SUBCUTANEOUS PORT COMPARISON:  None. MEDICATIONS: Ancef 2 g; The antibiotic was administered within an appropriate time interval prior to skin puncture. ANESTHESIA/SEDATION: Versed 4.0 mg  IV; Fentanyl 100 mcg IV; Moderate Sedation Time:  40 minutes The patient was continuously monitored during the procedure by the interventional radiology nurse under my direct supervision. FLUOROSCOPY TIME:  24 seconds, 5 mGy COMPLICATIONS: None immediate. PROCEDURE: The procedure, risks, benefits, and alternatives were explained to the patient. Questions regarding the procedure were encouraged and answered. The patient understands and consents to the procedure. Patient was placed supine on the interventional table. Ultrasound confirmed a patent right internal jugular vein. Ultrasound image was saved for documentation. The right chest and neck were cleaned with a skin antiseptic and a sterile drape was placed. Maximal barrier sterile technique was utilized including caps, mask, sterile gowns, sterile gloves, sterile drape, hand hygiene and skin antiseptic. The right neck was anesthetized with 1% lidocaine. Small incision was made in the right neck with a blade. Micropuncture set was placed in the right internal jugular vein with ultrasound guidance. The micropuncture wire was used for measurement purposes. The right chest was anesthetized with 1% lidocaine with epinephrine. #15 blade was used to make an incision and a subcutaneous port pocket was formed. [place] was assembled. Subcutaneous tunnel was formed with a stiff tunneling device. The port catheter was brought through the subcutaneous tunnel. The port was placed in the subcutaneous pocket and sutured in place. The micropuncture set was exchanged for a peel-away sheath. The catheter was placed through the peel-away sheath and the tip was positioned at the SVC/right atrium junction. Catheter placement was confirmed with fluoroscopy. The port was accessed and flushed with heparinized saline. The port pocket was closed using two layers of absorbable sutures and Dermabond. The vein skin site was closed using  a single layer of absorbable suture and  Dermabond. Sterile dressings were applied. Patient tolerated the procedure well without an immediate complication. Ultrasound and fluoroscopic images were taken and saved for this procedure. IMPRESSION: Placement of a subcutaneous port device. Catheter tip at the SVC/right atrium junction. Electronically Signed   By: Markus Daft M.D.   On: 10/07/2018 16:13    Microbiology: Recent Results (from the past 240 hour(s))  Culture, blood (routine x 2)     Status: None (Preliminary result)   Collection Time: 10/29/18  9:52 PM  Result Value Ref Range Status   Specimen Description   Final    BLOOD PORTA CATH Performed at [facility], [address] [place], [place] [address]    Special Requests   Final    BOTTLES DRAWN AEROBIC AND ANAEROBIC Blood Culture adequate volume Performed at [facility] [address] [place], [place] [address]    Culture   Final    NO GROWTH 4 DAYS Performed at [facility], [place] [address] [place], [place] [address]    Report Status PENDING  Incomplete  Culture, blood (routine x 2)     Status: None (Preliminary result)   Collection Time: 10/29/18 10:25 PM  Result Value Ref Range Status   Specimen Description   Final    BLOOD RIGHT HAND Performed at [facility] [address] [place], [place] [address]    Special Requests   Final    BOTTLES DRAWN AEROBIC ONLY Blood Culture adequate volume Performed at [facility] [address] [place], [place] [address]    Culture   Final    NO GROWTH 4 DAYS Performed at [facility], [place] [address], [place], [place] [address]    Report Status PENDING  Incomplete     Labs: Basic Metabolic Panel: Recent Labs  Lab 10/29/18 1411  10/29/18 2152 10/29/18 2207 10/29/18 2255 10/30/18 0616 10/31/18 0949 11/01/18 0357 11/02/18 0321 11/03/18 0356  NA 139  --  137 139  --  137 140  --  139 138  K 4.0  --  2.4* 2.5*  --  4.9 3.9  --  2.9*  3.1*  CL 104  --  105  --   --  109 112*  --  106 108  CO2 25  --  23  --   --  23 22  --  25 23  GLUCOSE 121*  --  156*  --   --  94 92  --  99 96  BUN 31*  --  33*  --   --  30* 21  --  12 10  CREATININE 2.25*   < > 2.31*  --   --  2.09* 1.95* 1.62* 1.46* 1.41*  CALCIUM 9.1  --  8.5*  --   --  8.2* 8.4*  --  7.9* 8.1*  MG 1.9  --   --   --  1.8  --   --   --  1.1* 2.0   < > = values in this interval not displayed.   Liver Function Tests: Recent Labs  Lab 10/29/18 1411 10/29/18 2152  AST 11* 11*  ALT 7 9  ALKPHOS 91 83  BILITOT 0.2* 0.5  PROT 6.3* 5.8*  ALBUMIN 3.5 3.4*   No results for input(s): LIPASE, AMYLASE in the last 168 hours. No results for input(s): AMMONIA in the last 168 hours. CBC: Recent Labs  Lab 10/29/18 1411 10/29/18 2152  10/30/18 0616 10/30/18 1547 10/30/18 2206 10/31/18 0949 10/31/18 1651 11/01/18 0357 11/02/18 0321 11/03/18 0356  WBC 2.6* 2.9*   < > 2.6* 3.0* 2.6* 2.8* 3.9*  --   --   --   NEUTROABS 1.8 2.4  --   --   --   --  2.2  --   --   --   --   HGB 7.5* 8.0*   < > 6.8* 7.3* 6.9* 7.0* 6.9* 8.1* 8.7* 7.9*  HCT 23.7* 25.7*   < > 22.4* 23.0* 21.7* 22.0* 21.6* 25.3* 26.7* 25.4*  MCV 87.1 89.9   < > 91.1 88.5 89.3 89.1 88.9  --   --   --   PLT 7* 48*   < > 44* 39* 33* 32* 33*  --   --   --    < > = values in this interval not displayed.   Cardiac Enzymes: No results for input(s): CKTOTAL, CKMB, CKMBINDEX, TROPONINI in the last 168 hours. BNP: BNP (last 3 results) No results for input(s): BNP in the last 8760 hours.  ProBNP (last 3 results) No results for input(s): PROBNP in the last 8760 hours.  CBG: Recent Labs  Lab 10/30/18 0756 10/31/18 0743 11/01/18 0750 11/02/18 0803 11/03/18 0528  GLUCAP 89 112* 91 97 86       Signed:  Navy Belay  [facility] 11/03/2018, 9:32 AM

## 2018-11-03 NOTE — Plan of Care (Signed)

## 2018-11-03 NOTE — Discharge Instructions (Signed)
Anemia  Anemia is a condition in which you do not have enough red blood cells or hemoglobin. Hemoglobin is a substance in red blood cells that carries oxygen. When you do not have enough red blood cells or hemoglobin (are anemic), your body cannot get enough oxygen and your organs may not work properly. As a result, you may feel very tired or have other problems. What are the causes? Common causes of anemia include:  Excessive bleeding. Anemia can be caused by excessive bleeding inside or outside the body, including bleeding from the intestine or from periods in women.  Poor nutrition.  Long-lasting (chronic) kidney, thyroid, and liver disease.  Bone marrow disorders.  Cancer and treatments for cancer.  HIV (human immunodeficiency virus) and AIDS (acquired immunodeficiency syndrome).  Treatments for HIV and AIDS.  Spleen problems.  Blood disorders.  Infections, medicines, and autoimmune disorders that destroy red blood cells. What are the signs or symptoms? Symptoms of this condition include:  Minor weakness.  Dizziness.  Headache.  Feeling heartbeats that are irregular or faster than normal (palpitations).  Shortness of breath, especially with exercise.  Paleness.  Cold sensitivity.  Indigestion.  Nausea.  Difficulty sleeping.  Difficulty concentrating. Symptoms may occur suddenly or develop slowly. If your anemia is mild, you may not have symptoms. How is this diagnosed? This condition is diagnosed based on:  Blood tests.  Your medical history.  A physical exam.  Bone marrow biopsy. Your health care provider may also check your stool (feces) for blood and may do additional testing to look for the cause of your bleeding. You may also have other tests, including:  Imaging tests, such as a CT scan or MRI.  Endoscopy.  Colonoscopy. How is this treated? Treatment for this condition depends on the cause. If you continue to lose a lot of blood, you may  need to be treated at a hospital. Treatment may include:  Taking supplements of iron, vitamin M08, or folic acid.  Taking a hormone medicine (erythropoietin) that can help to stimulate red blood cell growth.  Having a blood transfusion. This may be needed if you lose a lot of blood.  Making changes to your diet.  Having surgery to remove your spleen. Follow these instructions at home:  Take over-the-counter and prescription medicines only as told by your health care provider.  Take supplements only as told by your health care provider.  Follow any diet instructions that you were given.  Keep all follow-up visits as told by your health care provider. This is important. Contact a health care provider if:  You develop new bleeding anywhere in the body. Get help right away if:  You are very weak.  You are short of breath.  You have pain in your abdomen or chest.  You are dizzy or feel faint.  You have trouble concentrating.  You have bloody or black, tarry stools.  You vomit repeatedly or you vomit up blood. Summary  Anemia is a condition in which you do not have enough red blood cells or enough of a substance in your red blood cells that carries oxygen (hemoglobin).  Symptoms may occur suddenly or develop slowly.  If your anemia is mild, you may not have symptoms.  This condition is diagnosed with blood tests as well as a medical history and physical exam. Other tests may be needed.  Treatment for this condition depends on the cause of the anemia. This information is not intended to replace advice given to you by  your health care provider. Make sure you discuss any questions you have with your health care provider. °Document Released: 08/10/2004 Document Revised: 08/04/2016 Document Reviewed: 08/04/2016 °Elsevier Interactive Patient Education © 2019 [employer] ° °

## 2018-11-04 ENCOUNTER — Inpatient Hospital Stay: Payer: Medicare Other | Admitting: Physician Assistant

## 2018-11-04 ENCOUNTER — Telehealth: Payer: Self-pay | Admitting: Oncology

## 2018-11-04 ENCOUNTER — Ambulatory Visit
Admission: RE | Admit: 2018-11-04 | Discharge: 2018-11-04 | Disposition: A | Discharge status: Home / Self Care | Payer: Medicare Other | Source: Ambulatory Visit | Attending: Radiation Oncology | Admitting: Radiation Oncology

## 2018-11-04 ENCOUNTER — Other Ambulatory Visit: Payer: Self-pay

## 2018-11-04 ENCOUNTER — Telehealth: Payer: Self-pay | Admitting: Internal Medicine

## 2018-11-04 ENCOUNTER — Inpatient Hospital Stay: Payer: Medicare Other

## 2018-11-04 DIAGNOSIS — D62 Acute posthemorrhagic anemia: Secondary | ICD-10-CM

## 2018-11-04 DIAGNOSIS — Z510 Encounter for antineoplastic radiation therapy: Secondary | ICD-10-CM | POA: Diagnosis present

## 2018-11-04 DIAGNOSIS — C3431 Malignant neoplasm of lower lobe, right bronchus or lung: Secondary | ICD-10-CM | POA: Diagnosis not present

## 2018-11-04 NOTE — Telephone Encounter (Signed)
Scheduled appt per 4/20 sch message - unable to reach pt . Left message with appt date and time

## 2018-11-04 NOTE — Telephone Encounter (Signed)
Sent high priority scheduling message to bring pt in for a lab appt in the next few days. Pt was discharged from the hospital over the weekend and continues to be anemic. Has thrombocytopenia as well.

## 2018-11-05 ENCOUNTER — Inpatient Hospital Stay: Payer: Medicare Other

## 2018-11-05 ENCOUNTER — Other Ambulatory Visit: Payer: Self-pay

## 2018-11-05 ENCOUNTER — Ambulatory Visit: Payer: Medicare Other

## 2018-11-05 ENCOUNTER — Ambulatory Visit
Admission: RE | Admit: 2018-11-05 | Discharge: 2018-11-05 | Disposition: A | Discharge status: Home / Self Care | Payer: Medicare Other | Source: Ambulatory Visit | Attending: Radiation Oncology | Admitting: Radiation Oncology

## 2018-11-05 DIAGNOSIS — C3431 Malignant neoplasm of lower lobe, right bronchus or lung: Secondary | ICD-10-CM | POA: Diagnosis not present

## 2018-11-05 DIAGNOSIS — Z510 Encounter for antineoplastic radiation therapy: Secondary | ICD-10-CM | POA: Diagnosis not present

## 2018-11-05 LAB — CMP (CANCER CENTER ONLY)
ALT: 8 U/L (ref 0–44)
AST: 14 U/L — ABNORMAL LOW (ref 15–41)
Albumin: 3.1 g/dL — ABNORMAL LOW (ref 3.5–5.0)
Alkaline Phosphatase: 102 U/L (ref 38–126)
Anion gap: 9 (ref 5–15)
BUN: 13 mg/dL (ref 8–23)
CO2: 23 mmol/L (ref 22–32)
Calcium: 8.4 mg/dL — ABNORMAL LOW (ref 8.9–10.3)
Chloride: 108 mmol/L (ref 98–111)
Creatinine: 1.71 mg/dL — ABNORMAL HIGH (ref 0.44–1.00)
GFR, Est AFR Am: 36 mL/min — ABNORMAL LOW (ref >60)
GFR, Estimated: 31 mL/min — ABNORMAL LOW (ref >60)
Glucose, Bld: 81 mg/dL (ref 70–99)
Potassium: 4.5 mmol/L (ref 3.5–5.1)
Sodium: 140 mmol/L (ref 135–145)
Total Bilirubin: 0.3 mg/dL (ref 0.3–1.2)
Total Protein: 5.7 g/dL — ABNORMAL LOW (ref 6.5–8.1)

## 2018-11-05 LAB — CBC WITH DIFFERENTIAL (CANCER CENTER ONLY)
Abs Immature Granulocytes: 0.03 10*3/uL (ref 0.00–0.07)
Basophils Absolute: 0 10*3/uL (ref 0.0–0.1)
Basophils Relative: 0 %
Eosinophils Absolute: 0 10*3/uL (ref 0.0–0.5)
Eosinophils Relative: 1 %
HCT: 26.8 % — ABNORMAL LOW (ref 36.0–46.0)
Hemoglobin: 8.4 g/dL — ABNORMAL LOW (ref 12.0–15.0)
Immature Granulocytes: 1 %
Lymphocytes Relative: 9 %
Lymphs Abs: 0.4 10*3/uL — ABNORMAL LOW (ref 0.7–4.0)
MCH: 27.3 pg (ref 26.0–34.0)
MCHC: 31.3 g/dL (ref 30.0–36.0)
MCV: 87 fL (ref 80.0–100.0)
Monocytes Absolute: 0.5 10*3/uL (ref 0.1–1.0)
Monocytes Relative: 13 %
Neutro Abs: 3.1 10*3/uL (ref 1.7–7.7)
Neutrophils Relative %: 76 %
Platelet Count: 50 10*3/uL — ABNORMAL LOW (ref 150–400)
RBC: 3.08 MIL/uL — ABNORMAL LOW (ref 3.87–5.11)
RDW: 20.1 % — ABNORMAL HIGH (ref 11.5–15.5)
WBC Count: 4 10*3/uL (ref 4.0–10.5)
nRBC: 0 % (ref 0.0–0.2)

## 2018-11-05 LAB — MAGNESIUM: Magnesium: 1.5 mg/dL — ABNORMAL LOW (ref 1.7–2.4)

## 2018-11-06 ENCOUNTER — Ambulatory Visit: Payer: Medicare Other

## 2018-11-06 ENCOUNTER — Encounter: Payer: Self-pay | Admitting: Radiation Oncology

## 2018-11-06 ENCOUNTER — Other Ambulatory Visit: Payer: Self-pay

## 2018-11-06 ENCOUNTER — Ambulatory Visit
Admission: RE | Admit: 2018-11-06 | Discharge: 2018-11-06 | Disposition: A | Discharge status: Home / Self Care | Payer: Medicare Other | Source: Ambulatory Visit | Attending: Radiation Oncology | Admitting: Radiation Oncology

## 2018-11-06 DIAGNOSIS — I10 Essential (primary) hypertension: Secondary | ICD-10-CM | POA: Diagnosis not present

## 2018-11-06 DIAGNOSIS — Z8673 Personal history of transient ischemic attack (TIA), and cerebral infarction without residual deficits: Secondary | ICD-10-CM | POA: Diagnosis not present

## 2018-11-06 DIAGNOSIS — Z7689 Persons encountering health services in other specified circumstances: Secondary | ICD-10-CM | POA: Diagnosis not present

## 2018-11-06 DIAGNOSIS — G8929 Other chronic pain: Secondary | ICD-10-CM | POA: Diagnosis not present

## 2018-11-06 DIAGNOSIS — Z87891 Personal history of nicotine dependence: Secondary | ICD-10-CM | POA: Diagnosis not present

## 2018-11-06 DIAGNOSIS — E860 Dehydration: Secondary | ICD-10-CM | POA: Diagnosis not present

## 2018-11-06 DIAGNOSIS — M545 Low back pain: Secondary | ICD-10-CM | POA: Diagnosis not present

## 2018-11-06 DIAGNOSIS — R197 Diarrhea, unspecified: Secondary | ICD-10-CM | POA: Diagnosis not present

## 2018-11-06 DIAGNOSIS — C3431 Malignant neoplasm of lower lobe, right bronchus or lung: Secondary | ICD-10-CM | POA: Diagnosis not present

## 2018-11-06 DIAGNOSIS — E785 Hyperlipidemia, unspecified: Secondary | ICD-10-CM | POA: Diagnosis not present

## 2018-11-06 DIAGNOSIS — Z79899 Other long term (current) drug therapy: Secondary | ICD-10-CM | POA: Diagnosis not present

## 2018-11-06 DIAGNOSIS — R1310 Dysphagia, unspecified: Secondary | ICD-10-CM | POA: Diagnosis not present

## 2018-11-06 DIAGNOSIS — R630 Anorexia: Secondary | ICD-10-CM | POA: Diagnosis not present

## 2018-11-06 DIAGNOSIS — R5383 Other fatigue: Secondary | ICD-10-CM | POA: Diagnosis not present

## 2018-11-06 DIAGNOSIS — R634 Abnormal weight loss: Secondary | ICD-10-CM | POA: Diagnosis not present

## 2018-11-06 DIAGNOSIS — Z5111 Encounter for antineoplastic chemotherapy: Secondary | ICD-10-CM | POA: Diagnosis not present

## 2018-11-06 DIAGNOSIS — M1990 Unspecified osteoarthritis, unspecified site: Secondary | ICD-10-CM | POA: Diagnosis not present

## 2018-11-07 ENCOUNTER — Ambulatory Visit: Payer: Medicare Other

## 2018-11-09 ENCOUNTER — Other Ambulatory Visit: Payer: Self-pay | Admitting: Family Medicine

## 2018-11-11 ENCOUNTER — Ambulatory Visit: Payer: Medicare Other | Admitting: Nutrition

## 2018-11-11 NOTE — Progress Notes (Signed)
RD working remotely.  Patient was identified to be at risk for malnutrition on the MST secondary to poor appetite and weight loss. 68 year old female diagnosed with SCLC in January 2020.  PMH includes Stroke, HTN, HLD, and Chronic Pain.  Medications include Lipitor, Ferrous Sulfate, Magnesium Oxide, Zofran, Protonix, Compazine, Carafate.  Labs include Creatinine 1.71, Albumin 3.1 and Magnesium 1.5 on April 26.  Height: 65 inches Weight: 191 pounds on April 15. Patient weighed 218 pounds in January 2020. BMI: 31.92.  Patient reports poor appetite, nausea and vomiting, taste alterations and painful swallowing. She tried Ensure in the hospital but "is not crazy about it". Reports significant Nausea and vomiting in the hospital because she couldn't get nausea medication on time. She is afraid to eat. Patient has lost approximately 12% of her weight over the last 4 months which is significant.  Nutrition Diagnosis: Unintended weight loss related to nausea, vomiting and poor appetite related to lung cancer as evidenced by 12% loss from usual body weight.  Intervention: Educated patient to consume small, frequent meals and snacks Educated patient to take nausea medicine as prescribed and ask for additional medicine as needed. Educated patient on strategies to improve taste alterations. Encouraged increased fluids. Recommended she try Boost Soothe and will provide samples, coupons and fact sheets at visit tomorrow.  Monitoring, Evaluation, Goals: Patient will increase calories and protein to minimize weight loss and tolerate treatment.  Next Visit: Patient to contact for further needs.

## 2018-11-12 ENCOUNTER — Inpatient Hospital Stay: Payer: Medicare Other

## 2018-11-12 ENCOUNTER — Encounter: Payer: Self-pay | Admitting: Physician Assistant

## 2018-11-12 ENCOUNTER — Inpatient Hospital Stay (HOSPITAL_BASED_OUTPATIENT_CLINIC_OR_DEPARTMENT_OTHER): Payer: Medicare Other | Admitting: Physician Assistant

## 2018-11-12 ENCOUNTER — Other Ambulatory Visit: Payer: Medicare Other

## 2018-11-12 ENCOUNTER — Other Ambulatory Visit: Payer: Self-pay

## 2018-11-12 VITALS — BP 112/81 | HR 93 | Temp 97.5°F | Resp 18 | Ht 65.0 in | Wt 187.8 lb

## 2018-11-12 DIAGNOSIS — Z87891 Personal history of nicotine dependence: Secondary | ICD-10-CM

## 2018-11-12 DIAGNOSIS — M545 Low back pain: Secondary | ICD-10-CM

## 2018-11-12 DIAGNOSIS — M1990 Unspecified osteoarthritis, unspecified site: Secondary | ICD-10-CM

## 2018-11-12 DIAGNOSIS — C3431 Malignant neoplasm of lower lobe, right bronchus or lung: Secondary | ICD-10-CM

## 2018-11-12 DIAGNOSIS — R5383 Other fatigue: Secondary | ICD-10-CM | POA: Diagnosis not present

## 2018-11-12 DIAGNOSIS — I10 Essential (primary) hypertension: Secondary | ICD-10-CM

## 2018-11-12 DIAGNOSIS — Z79899 Other long term (current) drug therapy: Secondary | ICD-10-CM

## 2018-11-12 DIAGNOSIS — E785 Hyperlipidemia, unspecified: Secondary | ICD-10-CM

## 2018-11-12 DIAGNOSIS — G8929 Other chronic pain: Secondary | ICD-10-CM

## 2018-11-12 DIAGNOSIS — Z5111 Encounter for antineoplastic chemotherapy: Secondary | ICD-10-CM

## 2018-11-12 DIAGNOSIS — R634 Abnormal weight loss: Secondary | ICD-10-CM

## 2018-11-12 DIAGNOSIS — D62 Acute posthemorrhagic anemia: Secondary | ICD-10-CM

## 2018-11-12 DIAGNOSIS — Z8673 Personal history of transient ischemic attack (TIA), and cerebral infarction without residual deficits: Secondary | ICD-10-CM

## 2018-11-12 DIAGNOSIS — R630 Anorexia: Secondary | ICD-10-CM

## 2018-11-12 DIAGNOSIS — R1310 Dysphagia, unspecified: Secondary | ICD-10-CM

## 2018-11-12 LAB — CBC WITH DIFFERENTIAL (CANCER CENTER ONLY)
Abs Immature Granulocytes: 0.01 10*3/uL (ref 0.00–0.07)
Basophils Absolute: 0 10*3/uL (ref 0.0–0.1)
Basophils Relative: 1 %
Eosinophils Absolute: 0 10*3/uL (ref 0.0–0.5)
Eosinophils Relative: 1 %
HCT: 27.9 % — ABNORMAL LOW (ref 36.0–46.0)
Hemoglobin: 8.6 g/dL — ABNORMAL LOW (ref 12.0–15.0)
Immature Granulocytes: 0 %
Lymphocytes Relative: 12 %
Lymphs Abs: 0.3 10*3/uL — ABNORMAL LOW (ref 0.7–4.0)
MCH: 28.1 pg (ref 26.0–34.0)
MCHC: 30.8 g/dL (ref 30.0–36.0)
MCV: 91.2 fL (ref 80.0–100.0)
Monocytes Absolute: 0.4 10*3/uL (ref 0.1–1.0)
Monocytes Relative: 17 %
Neutro Abs: 1.7 10*3/uL (ref 1.7–7.7)
Neutrophils Relative %: 69 %
Platelet Count: 129 10*3/uL — ABNORMAL LOW (ref 150–400)
RBC: 3.06 MIL/uL — ABNORMAL LOW (ref 3.87–5.11)
RDW: 21.1 % — ABNORMAL HIGH (ref 11.5–15.5)
WBC Count: 2.5 10*3/uL — ABNORMAL LOW (ref 4.0–10.5)
nRBC: 0 % (ref 0.0–0.2)

## 2018-11-12 LAB — CMP (CANCER CENTER ONLY)
ALT: 8 U/L (ref 0–44)
AST: 14 U/L — ABNORMAL LOW (ref 15–41)
Albumin: 3.1 g/dL — ABNORMAL LOW (ref 3.5–5.0)
Alkaline Phosphatase: 104 U/L (ref 38–126)
Anion gap: 10 (ref 5–15)
BUN: 14 mg/dL (ref 8–23)
CO2: 25 mmol/L (ref 22–32)
Calcium: 8.7 mg/dL — ABNORMAL LOW (ref 8.9–10.3)
Chloride: 105 mmol/L (ref 98–111)
Creatinine: 1.79 mg/dL — ABNORMAL HIGH (ref 0.44–1.00)
GFR, Est AFR Am: 34 mL/min — ABNORMAL LOW (ref >60)
GFR, Estimated: 29 mL/min — ABNORMAL LOW (ref >60)
Glucose, Bld: 100 mg/dL — ABNORMAL HIGH (ref 70–99)
Potassium: 3.9 mmol/L (ref 3.5–5.1)
Sodium: 140 mmol/L (ref 135–145)
Total Bilirubin: 0.2 mg/dL — ABNORMAL LOW (ref 0.3–1.2)
Total Protein: 6 g/dL — ABNORMAL LOW (ref 6.5–8.1)

## 2018-11-12 LAB — SAMPLE TO BLOOD BANK

## 2018-11-12 LAB — MAGNESIUM: Magnesium: 1.5 mg/dL — ABNORMAL LOW (ref 1.7–2.4)

## 2018-11-12 MED ORDER — SODIUM CHLORIDE 0.9 % IV SOLN
40.0000 mg/m2 | Freq: Once | INTRAVENOUS | Status: AC
  Administered 2018-11-12: 13:00:00 82 mg via INTRAVENOUS
  Filled 2018-11-12: qty 82

## 2018-11-12 MED ORDER — SODIUM CHLORIDE 0.9 % IV SOLN
80.0000 mg/m2 | Freq: Once | INTRAVENOUS | Status: AC
  Administered 2018-11-12: 160 mg via INTRAVENOUS
  Filled 2018-11-12: qty 8

## 2018-11-12 MED ORDER — SODIUM CHLORIDE 0.9 % IV SOLN
Freq: Once | INTRAVENOUS | Status: AC
  Administered 2018-11-12: 12:00:00 via INTRAVENOUS
  Filled 2018-11-12: qty 5

## 2018-11-12 MED ORDER — PALONOSETRON HCL INJECTION 0.25 MG/5ML
INTRAVENOUS | Status: AC
  Filled 2018-11-12: qty 5

## 2018-11-12 MED ORDER — SODIUM CHLORIDE 0.9% FLUSH
10.0000 mL | INTRAVENOUS | Status: DC | PRN
  Administered 2018-11-12: 10 mL
  Filled 2018-11-12: qty 10

## 2018-11-12 MED ORDER — SODIUM CHLORIDE 0.9 % IV SOLN
Freq: Once | INTRAVENOUS | Status: AC
  Administered 2018-11-12: 10:00:00 via INTRAVENOUS
  Filled 2018-11-12: qty 250

## 2018-11-12 MED ORDER — POTASSIUM CHLORIDE 2 MEQ/ML IV SOLN
Freq: Once | INTRAVENOUS | Status: AC
  Administered 2018-11-12: 10:00:00 via INTRAVENOUS
  Filled 2018-11-12: qty 1000

## 2018-11-12 MED ORDER — HEPARIN SOD (PORK) LOCK FLUSH 100 UNIT/ML IV SOLN
500.0000 [IU] | Freq: Once | INTRAVENOUS | Status: AC | PRN
  Administered 2018-11-12: 18:00:00 500 [IU]
  Filled 2018-11-12: qty 5

## 2018-11-12 MED ORDER — PALONOSETRON HCL INJECTION 0.25 MG/5ML
0.2500 mg | Freq: Once | INTRAVENOUS | Status: AC
  Administered 2018-11-12: 12:00:00 0.25 mg via INTRAVENOUS

## 2018-11-12 NOTE — Progress Notes (Signed)
Spoke w/ Dr. Julien Nordmann - he is aware that patient's serum creatinine has been increasing. He is decreasing the cisplatin dose by 50% for this reason. He is also going to reduce the etoposide dose.   He would also like an additional 2 grams of magnesium added to cisplatin fluids (total = 3.5 grams or 28 mEq magnesium today).  Demetrius Charity, PharmD, [facility] Pharmac[facility]acy Phone: [phone] 11/12/2018

## 2018-11-12 NOTE — Progress Notes (Signed)
Per Cassie Heilingoetter, PA. Ok to treat with today's Crt 1.79.  Treatment was dose reduced as well today due to elevated Crt.

## 2018-11-12 NOTE — Progress Notes (Signed)
[facility] OFFICE PROGRESS NOTE  Rutherford Guys, MD [address] [name] [id number]  DIAGNOSIS: Limited stage (T2b, N2,M0) small cell lung cancer diagnosed in January 2020. She presented with right lower lobe lung mass in addition to right hilar and mediastinal lymphadenopathy.  PRIOR THERAPY: None  CURRENT THERAPY: Systemic chemotherapy concurrent with radiation. Chemotherapy withcisplatin 80 mg/M2 on day 1 and etoposide 100 mg/M2 on days 1, 2 and 3 every 3 weeks.First dose September 03, 2018. Status post3cycle.Dose reduced for cycle #4 to cisplatin 40 mg/m2 and etoposide 80 mg/m2 due to renal insufficiency.   INTERVAL HISTORY: [name] 68 y.o. female returns to the clinic today for a follow-up visit.  The patient was recently discharged from the hospital due to symptomatic anemia, possible colitis as evidenced by CT abdomen/pelvis, acute kidney injury secondary to N/V/D, and electrolyte abnormalities. She received a platelet transfusion at [facility] just prior to her hospital admission due to a platelet count of 7,000. She then was admitted to the hospital and received 2 units of PRBCs. Since being discharged, she feels significantly improved. She has not had anymore episodes of nausea, vomiting, abdominal pain, or diarrhea since being discharged. She was worked up for a presumed GI bleed in the hospital due to her anemia and darkstool. She has a history of an upper GI bleed due to a bleeding ulcer. However, fecal occult testing was negative x2. She takes oral iron supplements daily which she states makes her stool dark. She denies any melena, hematochezia, hemoptysis, hematemesis, or hematuria at this time.   She has electrolyte abnormalities secondary to her treatment. She takes 400 mg TID of magnesium oxide p.o. daily. She has received IV magnesium sulfate as well on several occasions. In the hospital, she also was found to by hypokalemic. She recently completed  her prescription of potassium chloride.   The patient has had trouble eating and drinking secondary to her radiation treatment. She is followed by the nutritionists. She states she used to drink about 5-6 bottles of water daily, however, she states she has only been able to drink 3 bottles at the most due to her odynophagia. She has lost about 6 lbs this past month.   Otherwise, she denies any fever, chills, or night sweats. She denies any headaches or visual changes. She denies any chest pain, shortness of breath, or cough. She is here today for evaluation prior to staring cycle #4 of her treatment today.   MEDICAL HISTORY: Past Medical History:  Diagnosis Date  . Arthritis   . Chronic pain   . Hyperlipidemia   . Hypertension   . Low blood magnesium 10/04/2018  . lung ca dx'd 06/2018  . Stroke ([facility])    06/19/2018 minor    ALLERGIES:  has No Known Allergies.  MEDICATIONS:  Current Outpatient Medications  Medication Sig Dispense Refill  . acetaminophen (TYLENOL) 650 MG CR tablet Take 650 mg by mouth every 8 (eight) hours as needed for pain.    Marland Kitchen atorvastatin (LIPITOR) 80 MG tablet Take 1 tablet (80 mg total) by mouth daily at 6 PM. 90 tablet 3  . butalbital-acetaminophen-caffeine (FIORICET) 50-325-40 MG tablet Take 1-2 tablets by mouth every 6 (six) hours as needed for headache. 20 tablet 0  . feeding supplement, ENSURE ENLIVE, (ENSURE ENLIVE) LIQD Take 237 mLs by mouth 2 (two) times daily between meals. 60 Bottle 0  . ferrous sulfate 325 (65 FE) MG tablet Take 325 mg by mouth daily with breakfast.    .  lidocaine-prilocaine (EMLA) cream Apply 1 application topically as needed. (Patient taking differently: Apply 1 application topically as needed (port access). ) 30 g 0  . lisinopril (PRINIVIL,ZESTRIL) 40 MG tablet TAKE 1 TABLET BY MOUTH EVERY DAY (Patient taking differently: Take 40 mg by mouth daily. ) 30 tablet 0  . magnesium oxide (MAG-OX) 400 MG tablet TAKE 1 TABLET BY MOUTH 3 TIMES A  DAY (Patient taking differently: Take 400 mg by mouth 4 (four) times daily. ) 90 tablet 0  . oxyCODONE-acetaminophen (PERCOCET/ROXICET) 5-325 MG tablet Take 1 tablet by mouth every 8 (eight) hours as needed for severe pain. 15 tablet 0  . pantoprazole (PROTONIX) 40 MG tablet TAKE 1 TABLET (40 MG TOTAL) BY MOUTH 2 (TWO) TIMES DAILY BEFORE A MEAL. 180 tablet 1  . prochlorperazine (COMPAZINE) 10 MG tablet Take 1 tablet (10 mg total) by mouth every 6 (six) hours as needed for nausea or vomiting. 30 tablet 1  . sucralfate (CARAFATE) 1 GM/10ML suspension Take 10 mLs (1 g total) by mouth 4 (four) times daily -  with meals and at bedtime. 420 mL 0  . ondansetron (ZOFRAN) 4 MG tablet Take 1 tablet (4 mg total) by mouth every 8 (eight) hours as needed for nausea or vomiting (give 2nd). (Patient not taking: Reported on 11/12/2018) 30 tablet 0  . potassium chloride SA (K-DUR) 20 MEQ tablet Take 1 tablet (20 mEq total) by mouth daily. (Patient not taking: Reported on 11/12/2018) 5 tablet 0   No current facility-administered medications for this visit.    Facility-Administered Medications Ordered in Other Visits  Medication Dose Route Frequency Provider Last Rate Last Dose  . CISplatin (PLATINOL) 82 mg in sodium chloride 0.9 % 250 mL chemo infusion  40 mg/m2 (Treatment Plan Recorded) Intravenous Once Curt Bears, MD      . etoposide (VEPESID) 160 mg in sodium chloride 0.9 % 500 mL chemo infusion  80 mg/m2 (Treatment Plan Recorded) Intravenous Once Curt Bears, MD      . fosaprepitant (EMEND) 150 mg, dexamethasone (DECADRON) 12 mg in sodium chloride 0.9 % 145 mL IVPB   Intravenous Once Curt Bears, MD      . heparin lock flush 100 unit/mL  500 Units Intracatheter Once PRN Curt Bears, MD      . palonosetron (ALOXI) injection 0.25 mg  0.25 mg Intravenous Once Curt Bears, MD      . sodium chloride flush (NS) 0.9 % injection 10 mL  10 mL Intracatheter PRN Curt Bears, MD         SURGICAL HISTORY:  Past Surgical History:  Procedure Laterality Date  . BIOPSY  06/19/2018   Procedure: BIOPSY;  Surgeon: Laurence Spates, MD;  Location: [facility]COPY;  Service: Endoscopy;;  . BIOPSY  10/11/2018   Procedure: BIOPSY;  Surgeon: Laurence Spates, MD;  Location: [facility];  Service: Endoscopy;;  . ENDARTERECTOMY Right 06/26/2018   Procedure: ENDARTERECTOMY CAROTID RIGHT;  Surgeon: Serafina Mitchell, MD;  [facility] OR;  Service: Vascular;  Laterality: Right;  . ESOPHAGOGASTRODUODENOSCOPY (EGD) WITH PROPOFOL N/A 06/19/2018   Procedure: ESOPHAGOGASTRODUODENOSCOPY (EGD) WITH PROPOFOL;  Surgeon: Laurence Spates, MD;  Location: [facility]COPY;  Service: Endoscopy;  Laterality: N/A;  . FLEXIBLE SIGMOIDOSCOPY N/A 10/11/2018   Procedure: FLEXIBLE SIGMOIDOSCOPY;  Surgeon: Laurence Spates, MD;  Location: [facility];  Service: Endoscopy;  Laterality: N/A;  . IR IMAGING GUIDED PORT INSERTION  10/07/2018  . PATCH ANGIOPLASTY Right 06/26/2018   Procedure: PATCH ANGIOPLASTY USING A XENOSURE 1cm x 6cm BIOLOGIC PATCH;  Surgeon: Serafina Mitchell, MD;  Location: [facility] OR;  Service: Vascular;  Laterality: Right;  Marland Kitchen VIDEO BRONCHOSCOPY WITH ENDOBRONCHIAL ULTRASOUND N/A 08/14/2018   Procedure: VIDEO BRONCHOSCOPY WITH ENDOBRONCHIAL ULTRASOUND;  Surgeon: Collene Gobble, MD;  Location: [facility] OR;  Service: Thoracic;  Laterality: N/A;    REVIEW OF SYSTEMS:   Review of Systems  Constitutional: Positive for appetite change and unexpected weight change. Negative for chills, fatigue, and fever.  HENT: Positive for odynophagia. Negative for mouth sores, and nosebleeds.   Eyes: Negative for eye problems and icterus.  Respiratory: Negative for cough, hemoptysis, shortness of breath and wheezing.   Cardiovascular: Negative for chest pain and leg swelling.  Gastrointestinal: Positive for dark stools. Positive for diarrhea, nausea, and vomiting (resolved). Negative for abdominal pain and constipation.  Genitourinary:  Negative for bladder incontinence, difficulty urinating, dysuria, frequency and hematuria.   Musculoskeletal: Positive for chronic back pain. Negative for gait problem, neck pain and neck stiffness.  Skin: Negative for itching and rash.  Neurological: Negative for dizziness, extremity weakness, gait problem, headaches, light-headedness and seizures.  Hematological: Negative for adenopathy. Does not bruise/bleed easily.  Psychiatric/Behavioral: Negative for confusion, depression and sleep disturbance. The patient is not nervous/anxious.     PHYSICAL EXAMINATION:  Blood pressure 112/81, pulse 93, temperature (!) 97.5 F (36.4 C), temperature source Oral, resp. rate 18, height 5\' 5"  (1.651 m), weight 187 lb 12.8 oz (85.2 kg), SpO2 95 %.  ECOG PERFORMANCE STATUS: 1 - Symptomatic but completely ambulatory  Physical Exam  Constitutional: Oriented to person, place, and time and well-developed, well-nourished, and in no distress.   HENT:  Head: Normocephalic and atraumatic.  Mouth/Throat: Oropharynx is clear and moist. No oropharyngeal exudate.  Eyes: Conjunctivae are normal. Right eye exhibits no discharge. Left eye exhibits no discharge. No scleral icterus.  Neck: Normal range of motion. Neck supple.  Cardiovascular: Normal rate, regular rhythm, normal heart sounds and intact distal pulses.   Pulmonary/Chest: Effort normal and breath sounds normal. No respiratory distress. No wheezes. No rales.  Abdominal: Soft. Bowel sounds are normal. Exhibits no distension and no mass. There is no tenderness.  Musculoskeletal: Normal range of motion. Exhibits no edema.  Lymphadenopathy:    No cervical adenopathy.  Neurological: Alert and oriented to person, place, and time. Exhibits normal muscle tone. Gait normal. Coordination normal.  Skin: Skin is warm and dry. No rash noted. Not diaphoretic. No erythema. No pallor.  Psychiatric: Mood, memory and judgment normal.  Vitals reviewed.  LABORATORY  DATA: Lab Results  Component Value Date   WBC 2.5 (L) 11/12/2018   HGB 8.6 (L) 11/12/2018   HCT 27.9 (L) 11/12/2018   MCV 91.2 11/12/2018   PLT 129 (L) 11/12/2018      Chemistry      Component Value Date/Time   NA 140 11/12/2018 0807   NA 142 08/02/2018 1042   K 3.9 11/12/2018 0807   CL 105 11/12/2018 0807   CO2 25 11/12/2018 0807   BUN 14 11/12/2018 0807   BUN 9 08/02/2018 1042   CREATININE 1.79 (H) 11/12/2018 0807      Component Value Date/Time   CALCIUM 8.7 (L) 11/12/2018 0807   ALKPHOS 104 11/12/2018 0807   AST 14 (L) 11/12/2018 0807   ALT 8 11/12/2018 0807   BILITOT 0.2 (L) 11/12/2018 0807       RADIOGRAPHIC STUDIES:  Ct Abdomen Pelvis Wo Contrast  Result Date: 10/29/2018 CLINICAL DATA:  Left-sided flank pain, history of lung carcinoma EXAM: CT ABDOMEN  AND PELVIS WITHOUT CONTRAST TECHNIQUE: Multidetector CT imaging of the abdomen and pelvis was performed following the standard protocol without IV contrast. COMPARISON:  08/30/2018 FINDINGS: Lower chest: Lung bases demonstrate a right lower lobe mass lesion which now measures 2.9 cm in greatest dimension. This has decreased in size when compared with the prior PET-CT from 4.6 cm. This is consistent with the patient's given clinical history of lung carcinoma and recent treatment. Hepatobiliary: No focal liver abnormality is seen. No gallstones, gallbladder wall thickening, or biliary dilatation. Pancreas: Unremarkable. No pancreatic ductal dilatation or surrounding inflammatory changes. Spleen: Normal in size without focal abnormality. Adrenals/Urinary Tract: Adrenal glands are within normal limits. The kidneys are within normal limits. No renal calculi or obstructive changes are noted. The bladder is decompressed. Stomach/Bowel: Appendix is well visualized and within normal limits. Very mild pericolonic inflammatory changes are noted in the sigmoid colon. Small bowels within normal limits. Stomach is unremarkable.  Vascular/Lymphatic: Aortic atherosclerosis. No enlarged abdominal or pelvic lymph nodes. Reproductive: Uterus and bilateral adnexa are unremarkable. Other: Mild free fluid is noted within the pelvis likely of a reactive nature. Musculoskeletal: Degenerative changes of lumbar spine are noted. IMPRESSION: Decrease in size in right lower lobe mass consistent with previous treatment. Mild pericolonic inflammatory changes likely of an infectious etiology. No abscess or perforation is identified. These changes are concentrated predominately within the sigmoid. Electronically Signed   By: Inez Catalina M.D.   On: 10/29/2018 23:28   Dg Chest Port 1 View  Result Date: 10/29/2018 CLINICAL DATA:  Hypertension and history of lung carcinoma EXAM: PORTABLE CHEST 1 VIEW COMPARISON:  10/11/2018 FINDINGS: Cardiac shadows within normal limits. Right-sided chest wall port is noted with catheter tip at the cavoatrial junction. Lungs are well aerated bilaterally. Persistent right lower lobe mass lesion is noted stable from the recent CT examination. Stable right paratracheal prominence is noted consistent with lymphadenopathy. No bony abnormality is noted. IMPRESSION: Stable lung mass on the right. No new focal abnormality is seen. Electronically Signed   By: Inez Catalina M.D.   On: 10/29/2018 21:45     ASSESSMENT/PLAN:  This is a very pleasant 68 year old Caucasian female recently diagnosed with limited stage (T2b, N2, M0) small cell lung cancer. She presented with a right lower lobe lung mass in addition to right hilar and mediastinal lymphadenopathy.  She was diagnosed in January 2020.   The patient is currently undergoing treatment with cisplatin 80 mg on day 1 and etoposide 100 mg/m on days 1, 2, and 3 every 3 weeks.  She is status post 3 cycles. She has experienced occasional nausea, vomiting, diarrhea, pancytopenia, and electrolyte abnormalities. She also has experienced odynophagia and weight loss secondary to  radiation treatment, which she completed on November 05, 2018.   The patient was seen with Dr. Julien Nordmann today. Her hemoglobin is 8.6 today and platelets have improved to 129. We will continue to monitor her labs weekly. No intervention needed at this time. We will consider a blood transfusion if her hemoglobin is below <7 due to the blood shortage. Her potassium is WNL today. Her magnesium continues to be low at 1.5. We will arrange for an additional 2 g of magnesium sulfate to be added to her IVF today. She will continue to take 400 mg TID of magnesium oxide. Her creatinine has been increasing and was noted to be 1.79 today. Due to her renal insufficiency, her dose of cisplatin will be dose reduced to 40 mg/m2 and her etoposide will be  reduced to 80 mg/m2. She will proceed with treatment today as scheduled at the reduced dose.  She was encouraged to continue increasing her fluid intake. She expressed understanding as to the importance of increasing her fluid intake while on cisplatin. We will continue to monitor her creatinine on weekly labs.   I will arrange for a restaging CT scan of the chest be performed prior to her next appointment in 3 weeks. She will not receive contrast due to her renal insufficiency.   I will see her for a follow up visit in 3 weeks for evaluation and to review her scan results before proceeding with cycle #5.   The patient is followed closely by the [facility] nutritionists for her weight loss. She was given fact sheets, strategies to improve taste alterations, and samples of Boost Smoothe from them today.   The patient was advised to call immediately if she has any concerning symptoms in the interval. The patient voices understanding of current disease status and treatment options and is in agreement with the current care plan. All questions were answered. The patient knows to call the clinic with any problems, questions or concerns. We can certainly see the patient much sooner if  necessary  Orders Placed This Encounter  Procedures  . CT Chest Wo Contrast    Standing Status:   Future    Standing Expiration Date:   11/12/2019    Order Specific Question:   ** REASON FOR EXAM (FREE TEXT)    Answer:   Restaging Lung Cancer    Order Specific Question:   Preferred imaging location?    Answer:   [facility]    Order Specific Question:   Radiology Contrast Protocol - do NOT remove file path    Answer:   [email or web address]     Kelsie Kramp L Kyrie Fludd, PA-C 11/12/18  ADDENDUM: Hematology/Oncology Attending: I had a face-to-face encounter with the patient today.  I recommended her care plan.  This is a very pleasant 68 years old white female with limited stage small cell lung cancer and currently undergoing systemic chemotherapy with cisplatin and etoposide status post 3 cycles.  This was concurrent with radiotherapy during the first 2 cycles.  Her radiotherapy was completed recently. The patient tolerated the last cycle of her treatment much better. Her comprehensive metabolic panel today showed worsening of her renal function with elevated serum creatinine. I recommended for the patient to proceed with cycle #4 today but I will reduce the dose of her cisplatin 50% to 40 mg/M2 and  etoposide to 80 mg/M2. We will see the patient back for follow-up visit in 3 weeks for evaluation with repeat CT scan of the chest without contrast for restaging of her disease. The patient was advised to call immediately if she has any concerning symptoms in the interval.  Disclaimer: This note was dictated with voice recognition software. Similar sounding words can inadvertently be transcribed and may be missed upon review. Eilleen Kempf, MD 11/12/18

## 2018-11-12 NOTE — Patient Instructions (Signed)

## 2018-11-12 NOTE — Patient Instructions (Signed)
Carboplatin injection What is this medicine? CARBOPLATIN (KAR boe pla tin) is a chemotherapy drug. It targets fast dividing cells, like cancer cells, and causes these cells to die. This medicine is used to treat ovarian cancer and many other cancers. This medicine may be used for other purposes; ask your health care provider or pharmacist if you have questions. COMMON BRAND NAME(S): Paraplatin What should I tell my health care provider before I take this medicine? They need to know if you have any of these conditions: -blood disorders -hearing problems -kidney disease -recent or ongoing radiation therapy -an unusual or allergic reaction to carboplatin, cisplatin, other chemotherapy, other medicines, foods, dyes, or preservatives -pregnant or trying to get pregnant -breast-feeding How should I use this medicine? This drug is usually given as an infusion into a vein. It is administered in a hospital or clinic by a specially trained health care professional. Talk to your pediatrician regarding the use of this medicine in children. Special care may be needed. Overdosage: If you think you have taken too much of this medicine contact a poison control center or emergency room at once. NOTE: This medicine is only for you. Do not share this medicine with others. What if I miss a dose? It is important not to miss a dose. Call your doctor or health care professional if you are unable to keep an appointment. What may interact with this medicine? -medicines for seizures -medicines to increase blood counts like filgrastim, pegfilgrastim, sargramostim -some antibiotics like amikacin, gentamicin, neomycin, streptomycin, tobramycin -vaccines Talk to your doctor or health care professional before taking any of these medicines: -acetaminophen -aspirin -ibuprofen -ketoprofen -naproxen This list may not describe all possible interactions. Give your health care provider a list of all the medicines, herbs,  non-prescription drugs, or dietary supplements you use. Also tell them if you smoke, drink alcohol, or use illegal drugs. Some items may interact with your medicine. What should I watch for while using this medicine? Your condition will be monitored carefully while you are receiving this medicine. You will need important blood work done while you are taking this medicine. This drug may make you feel generally unwell. This is not uncommon, as chemotherapy can affect healthy cells as well as cancer cells. Report any side effects. Continue your course of treatment even though you feel ill unless your doctor tells you to stop. In some cases, you may be given additional medicines to help with side effects. Follow all directions for their use. Call your doctor or health care professional for advice if you get a fever, chills or sore throat, or other symptoms of a cold or flu. Do not treat yourself. This drug decreases your body's ability to fight infections. Try to avoid being around people who are sick. This medicine may increase your risk to bruise or bleed. Call your doctor or health care professional if you notice any unusual bleeding. Be careful brushing and flossing your teeth or using a toothpick because you may get an infection or bleed more easily. If you have any dental work done, tell your dentist you are receiving this medicine. Avoid taking products that contain aspirin, acetaminophen, ibuprofen, naproxen, or ketoprofen unless instructed by your doctor. These medicines may hide a fever. Do not become pregnant while taking this medicine. Women should inform their doctor if they wish to become pregnant or think they might be pregnant. There is a potential for serious side effects to an unborn child. Talk to your health care professional or  pharmacist for more information. Do not breast-feed an infant while taking this medicine. What side effects may I notice from receiving this medicine? Side effects  that you should report to your doctor or health care professional as soon as possible: -allergic reactions like skin rash, itching or hives, swelling of the face, lips, or tongue -signs of infection - fever or chills, cough, sore throat, pain or difficulty passing urine -signs of decreased platelets or bleeding - bruising, pinpoint red spots on the skin, black, tarry stools, nosebleeds -signs of decreased red blood cells - unusually weak or tired, fainting spells, lightheadedness -breathing problems -changes in hearing -changes in vision -chest pain -high blood pressure -low blood counts - This drug may decrease the number of white blood cells, red blood cells and platelets. You may be at increased risk for infections and bleeding. -nausea and vomiting -pain, swelling, redness or irritation at the injection site -pain, tingling, numbness in the hands or feet -problems with balance, talking, walking -trouble passing urine or change in the amount of urine Side effects that usually do not require medical attention (report to your doctor or health care professional if they continue or are bothersome): -hair loss -loss of appetite -metallic taste in the mouth or changes in taste This list may not describe all possible side effects. Call your doctor for medical advice about side effects. You may report side effects to FDA at 1-800-FDA-1088. Where should I keep my medicine? This drug is given in a hospital or clinic and will not be stored at home. NOTE: This sheet is a summary. It may not cover all possible information. If you have questions about this medicine, talk to your doctor, pharmacist, or health care provider.  2019 Elsevier/Gold Standard (2007-10-08 14:38:05)    Etoposide, VP-16 injection What is this medicine? ETOPOSIDE, VP-16 (e toe POE side) is a chemotherapy drug. It is used to treat testicular cancer, lung cancer, and other cancers. This medicine may be used for other  purposes; ask your health care provider or pharmacist if you have questions. COMMON BRAND NAME(S): Etopophos, Toposar, VePesid What should I tell my health care provider before I take this medicine? They need to know if you have any of these conditions: -infection -kidney disease -liver disease -low blood counts, like low white cell, platelet, or red cell counts -an unusual or allergic reaction to etoposide, other medicines, foods, dyes, or preservatives -pregnant or trying to get pregnant -breast-feeding How should I use this medicine? This medicine is for infusion into a vein. It is administered in a hospital or clinic by a specially trained health care professional. Talk to your pediatrician regarding the use of this medicine in children. Special care may be needed. Overdosage: If you think you have taken too much of this medicine contact a poison control center or emergency room at once. NOTE: This medicine is only for you. Do not share this medicine with others. What if I miss a dose? It is important not to miss your dose. Call your doctor or health care professional if you are unable to keep an appointment. What may interact with this medicine? -aspirin -certain medications for seizures like carbamazepine, phenobarbital, phenytoin, valproic acid -cyclosporine -levamisole -warfarin This list may not describe all possible interactions. Give your health care provider a list of all the medicines, herbs, non-prescription drugs, or dietary supplements you use. Also tell them if you smoke, drink alcohol, or use illegal drugs. Some items may interact with your medicine. What should I  watch for while using this medicine? Visit your doctor for checks on your progress. This drug may make you feel generally unwell. This is not uncommon, as chemotherapy can affect healthy cells as well as cancer cells. Report any side effects. Continue your course of treatment even though you feel ill unless your  doctor tells you to stop. In some cases, you may be given additional medicines to help with side effects. Follow all directions for their use. Call your doctor or health care professional for advice if you get a fever, chills or sore throat, or other symptoms of a cold or flu. Do not treat yourself. This drug decreases your body's ability to fight infections. Try to avoid being around people who are sick. This medicine may increase your risk to bruise or bleed. Call your doctor or health care professional if you notice any unusual bleeding. Talk to your doctor about your risk of cancer. You may be more at risk for certain types of cancers if you take this medicine. Do not become pregnant while taking this medicine or for at least 6 months after stopping it. Women should inform their doctor if they wish to become pregnant or think they might be pregnant. Women of child-bearing potential will need to have a negative pregnancy test before starting this medicine. There is a potential for serious side effects to an unborn child. Talk to your health care professional or pharmacist for more information. Do not breast-feed an infant while taking this medicine. Men must use a latex condom during sexual contact with a woman while taking this medicine and for at least 4 months after stopping it. A latex condom is needed even if you have had a vasectomy. Contact your doctor right away if your partner becomes pregnant. Do not donate sperm while taking this medicine and for at least 4 months after you stop taking this medicine. Men should inform their doctors if they wish to father a child. This medicine may lower sperm counts. What side effects may I notice from receiving this medicine? Side effects that you should report to your doctor or health care professional as soon as possible: -allergic reactions like skin rash, itching or hives, swelling of the face, lips, or tongue -low blood counts - this medicine may  decrease the number of white blood cells, red blood cells and platelets. You may be at increased risk for infections and bleeding. -signs of infection - fever or chills, cough, sore throat, pain or difficulty passing urine -signs of decreased platelets or bleeding - bruising, pinpoint red spots on the skin, black, tarry stools, blood in the urine -signs of decreased red blood cells - unusually weak or tired, fainting spells, lightheadedness -breathing problems -changes in vision -mouth or throat sores or ulcers -pain, redness, swelling or irritation at the injection site -pain, tingling, numbness in the hands or feet -redness, blistering, peeling or loosening of the skin, including inside the mouth -seizures -vomiting Side effects that usually do not require medical attention (report to your doctor or health care professional if they continue or are bothersome): -diarrhea -hair loss -loss of appetite -nausea -stomach pain This list may not describe all possible side effects. Call your doctor for medical advice about side effects. You may report side effects to FDA at 1-800-FDA-1088. Where should I keep my medicine? This drug is given in a hospital or clinic and will not be stored at home. NOTE: This sheet is a summary. It may not cover all possible information.  If you have questions about this medicine, talk to your doctor, pharmacist, or health care provider.  2019 Elsevier/Gold Standard (2015-06-25 11:53:23)

## 2018-11-13 ENCOUNTER — Ambulatory Visit: Payer: Medicare Other | Admitting: Family Medicine

## 2018-11-13 ENCOUNTER — Other Ambulatory Visit: Payer: Self-pay

## 2018-11-13 ENCOUNTER — Inpatient Hospital Stay: Payer: Medicare Other

## 2018-11-13 VITALS — BP 151/96 | HR 93 | Temp 97.5°F | Resp 16

## 2018-11-13 DIAGNOSIS — C3431 Malignant neoplasm of lower lobe, right bronchus or lung: Secondary | ICD-10-CM

## 2018-11-13 MED ORDER — HEPARIN SOD (PORK) LOCK FLUSH 100 UNIT/ML IV SOLN
500.0000 [IU] | Freq: Once | INTRAVENOUS | Status: AC | PRN
  Administered 2018-11-13: 500 [IU]
  Filled 2018-11-13: qty 5

## 2018-11-13 MED ORDER — SODIUM CHLORIDE 0.9% FLUSH
10.0000 mL | INTRAVENOUS | Status: DC | PRN
  Administered 2018-11-13: 10 mL
  Filled 2018-11-13: qty 10

## 2018-11-13 MED ORDER — SODIUM CHLORIDE 0.9 % IV SOLN
Freq: Once | INTRAVENOUS | Status: AC
  Administered 2018-11-13: 08:00:00 via INTRAVENOUS
  Filled 2018-11-13: qty 250

## 2018-11-13 MED ORDER — SODIUM CHLORIDE 0.9 % IV SOLN
80.0000 mg/m2 | Freq: Once | INTRAVENOUS | Status: AC
  Administered 2018-11-13: 08:00:00 160 mg via INTRAVENOUS
  Filled 2018-11-13: qty 8

## 2018-11-13 MED ORDER — DEXAMETHASONE SODIUM PHOSPHATE 10 MG/ML IJ SOLN
10.0000 mg | Freq: Once | INTRAMUSCULAR | Status: AC
  Administered 2018-11-13: 10 mg via INTRAVENOUS

## 2018-11-13 MED ORDER — DEXAMETHASONE SODIUM PHOSPHATE 10 MG/ML IJ SOLN
INTRAMUSCULAR | Status: AC
  Filled 2018-11-13: qty 1

## 2018-11-13 NOTE — Patient Instructions (Signed)
[facility] Discharge Instructions for Patients Receiving Chemotherapy  Today you received the following chemotherapy agents:  Etoposide.  To help prevent nausea and vomiting after your treatment, we encourage you to take your nausea medication as directed.   If you develop nausea and vomiting that is not controlled by your nausea medication, call the clinic.   BELOW ARE SYMPTOMS THAT SHOULD BE REPORTED IMMEDIATELY:  *FEVER GREATER THAN 100.5 F  *CHILLS WITH OR WITHOUT FEVER  NAUSEA AND VOMITING THAT IS NOT CONTROLLED WITH YOUR NAUSEA MEDICATION  *UNUSUAL SHORTNESS OF BREATH  *UNUSUAL BRUISING OR BLEEDING  TENDERNESS IN MOUTH AND THROAT WITH OR WITHOUT PRESENCE OF ULCERS  *URINARY PROBLEMS  *BOWEL PROBLEMS  UNUSUAL RASH Items with * indicate a potential emergency and should be followed up as soon as possible.  Feel free to call the clinic should you have any questions or concerns. The clinic phone number is [phone].  Please show the [facility] at check-in to the Emergency Department and triage nurse.

## 2018-11-13 NOTE — Progress Notes (Signed)
Per Dr. Julien Nordmann, ok to treat with elevated BP.

## 2018-11-14 ENCOUNTER — Other Ambulatory Visit: Payer: Self-pay

## 2018-11-14 ENCOUNTER — Inpatient Hospital Stay: Payer: Medicare Other

## 2018-11-14 VITALS — BP 147/85 | HR 76 | Temp 98.2°F | Resp 18

## 2018-11-14 DIAGNOSIS — C3431 Malignant neoplasm of lower lobe, right bronchus or lung: Secondary | ICD-10-CM | POA: Diagnosis not present

## 2018-11-14 MED ORDER — DEXAMETHASONE SODIUM PHOSPHATE 10 MG/ML IJ SOLN
10.0000 mg | Freq: Once | INTRAMUSCULAR | Status: AC
  Administered 2018-11-14: 10 mg via INTRAVENOUS

## 2018-11-14 MED ORDER — DEXAMETHASONE SODIUM PHOSPHATE 10 MG/ML IJ SOLN
INTRAMUSCULAR | Status: AC
  Filled 2018-11-14: qty 1

## 2018-11-14 MED ORDER — SODIUM CHLORIDE 0.9% FLUSH
10.0000 mL | INTRAVENOUS | Status: DC | PRN
  Administered 2018-11-14: 10 mL
  Filled 2018-11-14: qty 10

## 2018-11-14 MED ORDER — PEGFILGRASTIM 6 MG/0.6ML ~~LOC~~ PSKT
PREFILLED_SYRINGE | SUBCUTANEOUS | Status: AC
  Filled 2018-11-14: qty 0.6

## 2018-11-14 MED ORDER — SODIUM CHLORIDE 0.9 % IV SOLN
80.0000 mg/m2 | Freq: Once | INTRAVENOUS | Status: AC
  Administered 2018-11-14: 14:00:00 160 mg via INTRAVENOUS
  Filled 2018-11-14: qty 8

## 2018-11-14 MED ORDER — SODIUM CHLORIDE 0.9 % IV SOLN
Freq: Once | INTRAVENOUS | Status: AC
  Administered 2018-11-14: 13:00:00 via INTRAVENOUS
  Filled 2018-11-14: qty 250

## 2018-11-14 MED ORDER — PEGFILGRASTIM 6 MG/0.6ML ~~LOC~~ PSKT
6.0000 mg | PREFILLED_SYRINGE | Freq: Once | SUBCUTANEOUS | Status: AC
  Administered 2018-11-14: 6 mg via SUBCUTANEOUS

## 2018-11-14 MED ORDER — HEPARIN SOD (PORK) LOCK FLUSH 100 UNIT/ML IV SOLN
500.0000 [IU] | Freq: Once | INTRAVENOUS | Status: AC | PRN
  Administered 2018-11-14: 15:00:00 500 [IU]
  Filled 2018-11-14: qty 5

## 2018-11-14 NOTE — Progress Notes (Incomplete)
°  Radiation Oncology         [phone]) [phone] ________________________________  Name: [name] MRN: [id number]  Date: 11/06/2018  DOB: 05-Mar-1951  End of Treatment Note  Diagnosis:   67 y.o. female with Stage T2a N2 m0 Small Cell Carcinoma of the RLL   Indication for treatment:  Curative       Radiation treatment dates:   09/25/2018 - 11/06/2018  Site/dose:   Right Lung / 60 Gy in 30 fractions  Beams/energy:   3D / 6X, 10X Photon  Narrative: The patient tolerated radiation treatment with concurrent radiosensitizing chemotherapy.  She did develop some dysphasia, managed with Carafate. She reported poor appetite related to dysphasia and taste changes. Towards the end of treatment, she experienced severe fatigue and weakness requiring hospitalization. After discharge, she reported some tenderness along her left forearm. She had some bruising in this area which developed while she was in the hospital, possibly some phlebitis. Hyperpigmentation was noted over the right upper back. The patient was encouarged to use the Radiaplex gel already provided.  Plan: The patient has completed radiation treatment. The patient will return to radiation oncology clinic for routine followup in one month. I advised them to call or return sooner if they have any questions or concerns related to their recovery or treatment.  -----------------------------------  Blair Promise, PhD, MD  This document serves as a record of services personally performed by Gery Pray, MD. It was created on his behalf by Rae Lips, a trained medical scribe. The creation of this record is based on the scribe's personal observations and the provider's statements to them. This document has been checked and approved by the attending provider.

## 2018-11-14 NOTE — Patient Instructions (Signed)
[facility] Discharge Instructions for Patients Receiving Chemotherapy  Today you received the following chemotherapy agents:  Etoposide.  To help prevent nausea and vomiting after your treatment, we encourage you to take your nausea medication as directed.   If you develop nausea and vomiting that is not controlled by your nausea medication, call the clinic.   BELOW ARE SYMPTOMS THAT SHOULD BE REPORTED IMMEDIATELY:  *FEVER GREATER THAN 100.5 F  *CHILLS WITH OR WITHOUT FEVER  NAUSEA AND VOMITING THAT IS NOT CONTROLLED WITH YOUR NAUSEA MEDICATION  *UNUSUAL SHORTNESS OF BREATH  *UNUSUAL BRUISING OR BLEEDING  TENDERNESS IN MOUTH AND THROAT WITH OR WITHOUT PRESENCE OF ULCERS  *URINARY PROBLEMS  *BOWEL PROBLEMS  UNUSUAL RASH Items with * indicate a potential emergency and should be followed up as soon as possible.  Feel free to call the clinic should you have any questions or concerns. The clinic phone number is [phone].  Please show the [facility] at check-in to the Emergency Department and triage nurse.

## 2018-11-15 DIAGNOSIS — D61818 Other pancytopenia: Secondary | ICD-10-CM | POA: Insufficient documentation

## 2018-11-15 DIAGNOSIS — M1990 Unspecified osteoarthritis, unspecified site: Secondary | ICD-10-CM | POA: Insufficient documentation

## 2018-11-15 DIAGNOSIS — Z923 Personal history of irradiation: Secondary | ICD-10-CM | POA: Insufficient documentation

## 2018-11-15 DIAGNOSIS — C3431 Malignant neoplasm of lower lobe, right bronchus or lung: Secondary | ICD-10-CM | POA: Insufficient documentation

## 2018-11-15 DIAGNOSIS — C781 Secondary malignant neoplasm of mediastinum: Secondary | ICD-10-CM | POA: Insufficient documentation

## 2018-11-15 DIAGNOSIS — E785 Hyperlipidemia, unspecified: Secondary | ICD-10-CM | POA: Insufficient documentation

## 2018-11-15 DIAGNOSIS — Z5111 Encounter for antineoplastic chemotherapy: Secondary | ICD-10-CM | POA: Insufficient documentation

## 2018-11-15 DIAGNOSIS — E878 Other disorders of electrolyte and fluid balance, not elsewhere classified: Secondary | ICD-10-CM | POA: Insufficient documentation

## 2018-11-15 DIAGNOSIS — Z8673 Personal history of transient ischemic attack (TIA), and cerebral infarction without residual deficits: Secondary | ICD-10-CM | POA: Insufficient documentation

## 2018-11-15 DIAGNOSIS — Z79899 Other long term (current) drug therapy: Secondary | ICD-10-CM | POA: Insufficient documentation

## 2018-11-15 DIAGNOSIS — J439 Emphysema, unspecified: Secondary | ICD-10-CM | POA: Insufficient documentation

## 2018-11-15 DIAGNOSIS — I10 Essential (primary) hypertension: Secondary | ICD-10-CM | POA: Insufficient documentation

## 2018-11-15 DIAGNOSIS — R634 Abnormal weight loss: Secondary | ICD-10-CM | POA: Insufficient documentation

## 2018-11-15 DIAGNOSIS — I700 Atherosclerosis of aorta: Secondary | ICD-10-CM | POA: Insufficient documentation

## 2018-11-15 DIAGNOSIS — R5383 Other fatigue: Secondary | ICD-10-CM | POA: Insufficient documentation

## 2018-11-15 DIAGNOSIS — N179 Acute kidney failure, unspecified: Secondary | ICD-10-CM | POA: Insufficient documentation

## 2018-11-19 ENCOUNTER — Inpatient Hospital Stay: Payer: Medicare Other | Attending: Internal Medicine

## 2018-11-19 ENCOUNTER — Inpatient Hospital Stay: Payer: Medicare Other

## 2018-11-19 ENCOUNTER — Other Ambulatory Visit: Payer: Medicare Other

## 2018-11-19 ENCOUNTER — Telehealth: Payer: Self-pay | Admitting: Physician Assistant

## 2018-11-19 ENCOUNTER — Other Ambulatory Visit: Payer: Self-pay

## 2018-11-19 ENCOUNTER — Telehealth: Payer: Self-pay | Admitting: *Deleted

## 2018-11-19 DIAGNOSIS — Z8673 Personal history of transient ischemic attack (TIA), and cerebral infarction without residual deficits: Secondary | ICD-10-CM | POA: Diagnosis not present

## 2018-11-19 DIAGNOSIS — I700 Atherosclerosis of aorta: Secondary | ICD-10-CM | POA: Diagnosis not present

## 2018-11-19 DIAGNOSIS — M1990 Unspecified osteoarthritis, unspecified site: Secondary | ICD-10-CM | POA: Diagnosis not present

## 2018-11-19 DIAGNOSIS — N179 Acute kidney failure, unspecified: Secondary | ICD-10-CM | POA: Diagnosis not present

## 2018-11-19 DIAGNOSIS — C3431 Malignant neoplasm of lower lobe, right bronchus or lung: Secondary | ICD-10-CM | POA: Diagnosis not present

## 2018-11-19 DIAGNOSIS — J439 Emphysema, unspecified: Secondary | ICD-10-CM | POA: Diagnosis not present

## 2018-11-19 DIAGNOSIS — E785 Hyperlipidemia, unspecified: Secondary | ICD-10-CM | POA: Diagnosis not present

## 2018-11-19 DIAGNOSIS — Z79899 Other long term (current) drug therapy: Secondary | ICD-10-CM | POA: Diagnosis not present

## 2018-11-19 DIAGNOSIS — I10 Essential (primary) hypertension: Secondary | ICD-10-CM | POA: Diagnosis not present

## 2018-11-19 DIAGNOSIS — E878 Other disorders of electrolyte and fluid balance, not elsewhere classified: Secondary | ICD-10-CM | POA: Diagnosis not present

## 2018-11-19 DIAGNOSIS — Z5111 Encounter for antineoplastic chemotherapy: Secondary | ICD-10-CM | POA: Diagnosis not present

## 2018-11-19 DIAGNOSIS — C781 Secondary malignant neoplasm of mediastinum: Secondary | ICD-10-CM | POA: Diagnosis not present

## 2018-11-19 DIAGNOSIS — R5383 Other fatigue: Secondary | ICD-10-CM | POA: Diagnosis not present

## 2018-11-19 DIAGNOSIS — D61818 Other pancytopenia: Secondary | ICD-10-CM | POA: Diagnosis not present

## 2018-11-19 DIAGNOSIS — R634 Abnormal weight loss: Secondary | ICD-10-CM | POA: Diagnosis not present

## 2018-11-19 DIAGNOSIS — Z923 Personal history of irradiation: Secondary | ICD-10-CM | POA: Diagnosis not present

## 2018-11-19 LAB — CBC WITH DIFFERENTIAL (CANCER CENTER ONLY)
Abs Immature Granulocytes: 0.06 10*3/uL (ref 0.00–0.07)
Basophils Absolute: 0.1 10*3/uL (ref 0.0–0.1)
Basophils Relative: 1 %
Eosinophils Absolute: 0 10*3/uL (ref 0.0–0.5)
Eosinophils Relative: 0 %
HCT: 27.6 % — ABNORMAL LOW (ref 36.0–46.0)
Hemoglobin: 8.6 g/dL — ABNORMAL LOW (ref 12.0–15.0)
Immature Granulocytes: 1 %
Lymphocytes Relative: 7 %
Lymphs Abs: 0.5 10*3/uL — ABNORMAL LOW (ref 0.7–4.0)
MCH: 28.4 pg (ref 26.0–34.0)
MCHC: 31.2 g/dL (ref 30.0–36.0)
MCV: 91.1 fL (ref 80.0–100.0)
Monocytes Absolute: 0.3 10*3/uL (ref 0.1–1.0)
Monocytes Relative: 4 %
Neutro Abs: 5.7 10*3/uL (ref 1.7–7.7)
Neutrophils Relative %: 87 %
Platelet Count: 69 10*3/uL — ABNORMAL LOW (ref 150–400)
RBC: 3.03 MIL/uL — ABNORMAL LOW (ref 3.87–5.11)
RDW: 21.9 % — ABNORMAL HIGH (ref 11.5–15.5)
WBC Count: 6.6 10*3/uL (ref 4.0–10.5)
nRBC: 0 % (ref 0.0–0.2)

## 2018-11-19 LAB — COMPREHENSIVE METABOLIC PANEL
ALT: 7 U/L (ref 0–44)
AST: 9 U/L — ABNORMAL LOW (ref 15–41)
Albumin: 3.5 g/dL (ref 3.5–5.0)
Alkaline Phosphatase: 107 U/L (ref 38–126)
Anion gap: 10 (ref 5–15)
BUN: 26 mg/dL — ABNORMAL HIGH (ref 8–23)
CO2: 26 mmol/L (ref 22–32)
Calcium: 9 mg/dL (ref 8.9–10.3)
Chloride: 102 mmol/L (ref 98–111)
Creatinine, Ser: 2.16 mg/dL — ABNORMAL HIGH (ref 0.44–1.00)
GFR calc Af Amer: 27 mL/min — ABNORMAL LOW (ref >60)
GFR calc non Af Amer: 23 mL/min — ABNORMAL LOW (ref >60)
Glucose, Bld: 101 mg/dL — ABNORMAL HIGH (ref 70–99)
Potassium: 4.4 mmol/L (ref 3.5–5.1)
Sodium: 138 mmol/L (ref 135–145)
Total Bilirubin: 0.4 mg/dL (ref 0.3–1.2)
Total Protein: 6.3 g/dL — ABNORMAL LOW (ref 6.5–8.1)

## 2018-11-19 LAB — MAGNESIUM: Magnesium: 1.2 mg/dL — CL (ref 1.7–2.4)

## 2018-11-19 NOTE — Telephone Encounter (Signed)
Received call report from [facility].  "Today's Mg+ = 1.2."  Message left for collaborative with results.

## 2018-11-19 NOTE — Telephone Encounter (Signed)
Spoke to the patient and her significant other bout her magnesium from routine labs today. I spoke to Dr. Julien Nordmann about her magnesium. She was instructed to take an additional 400 mg magnesium oxide pill a day in addition to what she is already taking. During her next infusion, we likely will add additional magnesium sulfate to her IV. She expressed understanding. We also discussed the importance of increasing her fluid intake. All questions were answered.

## 2018-11-20 ENCOUNTER — Telehealth: Payer: Self-pay | Admitting: Medical Oncology

## 2018-11-20 ENCOUNTER — Other Ambulatory Visit: Payer: Self-pay | Admitting: Family Medicine

## 2018-11-20 NOTE — Telephone Encounter (Signed)
Appts for this week given to pts brother, Montine Circle. HE said he will give her this information.

## 2018-11-21 ENCOUNTER — Other Ambulatory Visit: Payer: Self-pay | Admitting: Internal Medicine

## 2018-11-21 ENCOUNTER — Encounter (HOSPITAL_COMMUNITY): Payer: Self-pay

## 2018-11-21 ENCOUNTER — Ambulatory Visit (HOSPITAL_COMMUNITY)
Admission: RE | Admit: 2018-11-21 | Discharge: 2018-11-21 | Disposition: A | Discharge status: Home / Self Care | Payer: Medicare Other | Source: Ambulatory Visit | Attending: Physician Assistant | Admitting: Physician Assistant

## 2018-11-21 ENCOUNTER — Other Ambulatory Visit: Payer: Self-pay | Admitting: Family Medicine

## 2018-11-21 ENCOUNTER — Other Ambulatory Visit: Payer: Self-pay

## 2018-11-21 DIAGNOSIS — C3431 Malignant neoplasm of lower lobe, right bronchus or lung: Secondary | ICD-10-CM | POA: Insufficient documentation

## 2018-11-22 ENCOUNTER — Other Ambulatory Visit: Payer: Self-pay | Admitting: Physician Assistant

## 2018-11-22 ENCOUNTER — Other Ambulatory Visit: Payer: Self-pay

## 2018-11-22 ENCOUNTER — Inpatient Hospital Stay: Payer: Medicare Other

## 2018-11-22 VITALS — BP 130/83 | HR 84 | Temp 98.0°F | Resp 16

## 2018-11-22 DIAGNOSIS — N179 Acute kidney failure, unspecified: Secondary | ICD-10-CM

## 2018-11-22 DIAGNOSIS — C3431 Malignant neoplasm of lower lobe, right bronchus or lung: Secondary | ICD-10-CM | POA: Diagnosis not present

## 2018-11-22 MED ORDER — HEPARIN SOD (PORK) LOCK FLUSH 100 UNIT/ML IV SOLN
500.0000 [IU] | Freq: Once | INTRAVENOUS | Status: AC | PRN
  Administered 2018-11-22: 500 [IU]
  Filled 2018-11-22: qty 5

## 2018-11-22 MED ORDER — SODIUM CHLORIDE 0.9% FLUSH
10.0000 mL | Freq: Once | INTRAVENOUS | Status: AC | PRN
  Administered 2018-11-22: 17:00:00 10 mL
  Filled 2018-11-22: qty 10

## 2018-11-22 MED ORDER — SODIUM CHLORIDE 0.9 % IV SOLN
4.0000 g | Freq: Once | INTRAVENOUS | Status: AC
  Administered 2018-11-22: 4 g via INTRAVENOUS
  Filled 2018-11-22: qty 8

## 2018-11-22 MED ORDER — SODIUM CHLORIDE 0.9 % IV SOLN
Freq: Once | INTRAVENOUS | Status: AC
  Administered 2018-11-22: 13:00:00 via INTRAVENOUS
  Filled 2018-11-22: qty 250

## 2018-11-22 NOTE — Patient Instructions (Signed)
Dehydration, Adult  Dehydration is a condition in which there is not enough fluid or water in the body. This happens when you lose more fluids than you take in. Important organs, such as the kidneys, brain, and heart, cannot function without a proper amount of fluids. Any loss of fluids from the body can lead to dehydration. Dehydration can range from mild to severe. This condition should be treated right away to prevent it from becoming severe. What are the causes? This condition may be caused by:  Vomiting.  Diarrhea.  Excessive sweating, such as from heat exposure or exercise.  Not drinking enough fluid, especially: ? When ill. ? While doing activity that requires a lot of energy.  Excessive urination.  Fever.  Infection.  Certain medicines, such as medicines that cause the body to lose excess fluid (diuretics).  Inability to access safe drinking water.  Reduced physical ability to get adequate water and food. What increases the risk? This condition is more likely to develop in people:  Who have a poorly controlled long-term (chronic) illness, such as diabetes, heart disease, or kidney disease.  Who are age 65 or older.  Who are disabled.  Who live in a place with high altitude.  Who play endurance sports. What are the signs or symptoms? Symptoms of mild dehydration may include:  Thirst.  Dry lips.  Slightly dry mouth.  Dry, warm skin.  Dizziness. Symptoms of moderate dehydration may include:  Very dry mouth.  Muscle cramps.  Dark urine. Urine may be the color of tea.  Decreased urine production.  Decreased tear production.  Heartbeat that is irregular or faster than normal (palpitations).  Headache.  Light-headedness, especially when you stand up from a sitting position.  Fainting (syncope). Symptoms of severe dehydration may include:  Changes in skin, such as: ? Cold and clammy skin. ? Blotchy (mottled) or pale skin. ? Skin that does  not quickly return to normal after being lightly pinched and released (poor skin turgor).  Changes in body fluids, such as: ? Extreme thirst. ? No tear production. ? Inability to sweat when body temperature is high, such as in hot weather. ? Very little urine production.  Changes in vital signs, such as: ? Weak pulse. ? Pulse that is more than 100 beats a minute when sitting still. ? Rapid breathing. ? Low blood pressure.  Other changes, such as: ? Sunken eyes. ? Cold hands and feet. ? Confusion. ? Lack of energy (lethargy). ? Difficulty waking up from sleep. ? Short-term weight loss. ? Unconsciousness. How is this diagnosed? This condition is diagnosed based on your symptoms and a physical exam. Blood and urine tests may be done to help confirm the diagnosis. How is this treated? Treatment for this condition depends on the severity. Mild or moderate dehydration can often be treated at home. Treatment should be started right away. Do not wait until dehydration becomes severe. Severe dehydration is an emergency and it needs to be treated in a hospital. Treatment for mild dehydration may include:  Drinking more fluids.  Replacing salts and minerals in your blood (electrolytes) that you may have lost. Treatment for moderate dehydration may include:  Drinking an oral rehydration solution (ORS). This is a drink that helps you replace fluids and electrolytes (rehydrate). It can be found at pharmacies and retail stores. Treatment for severe dehydration may include:  Receiving fluids through an IV tube.  Receiving an electrolyte solution through a feeding tube that is passed through your nose and   into your stomach (nasogastric tube, or NG tube).  Correcting any abnormalities in electrolytes.  Treating the underlying cause of dehydration. Follow these instructions at home:  If directed by your health care provider, drink an ORS: ? Make an ORS by following instructions on the  package. ? Start by drinking small amounts, about  cup (120 mL) every 5-10 minutes. ? Slowly increase how much you drink until you have taken the amount recommended by your health care provider.  Drink enough clear fluid to keep your urine clear or pale yellow. If you were told to drink an ORS, finish the ORS first, then start slowly drinking other clear fluids. Drink fluids such as: ? Water. Do not drink only water. Doing that can lead to having too little salt (sodium) in the body (hyponatremia). ? Ice chips. ? Fruit juice that you have added water to (diluted fruit juice). ? Low-calorie sports drinks.  Avoid: ? Alcohol. ? Drinks that contain a lot of sugar. These include high-calorie sports drinks, fruit juice that is not diluted, and soda. ? Caffeine. ? Foods that are greasy or contain a lot of fat or sugar.  Take over-the-counter and prescription medicines only as told by your health care provider.  Do not take sodium tablets. This can lead to having too much sodium in the body (hypernatremia).  Eat foods that contain a healthy balance of electrolytes, such as bananas, oranges, potatoes, tomatoes, and spinach.  Keep all follow-up visits as told by your health care provider. This is important. Contact a health care provider if:  You have abdominal pain that: ? Gets worse. ? Stays in one area (localizes).  You have a rash.  You have a stiff neck.  You are more irritable than usual.  You are sleepier or more difficult to wake up than usual.  You feel weak or dizzy.  You feel very thirsty.  You have urinated only a small amount of very dark urine over 6-8 hours. Get help right away if:  You have symptoms of severe dehydration.  You cannot drink fluids without vomiting.  Your symptoms get worse with treatment.  You have a fever.  You have a severe headache.  You have vomiting or diarrhea that: ? Gets worse. ? Does not go away.  You have blood or green matter  (bile) in your vomit.  You have blood in your stool. This may cause stool to look black and tarry.  You have not urinated in 6-8 hours.  You faint.  Your heart rate while sitting still is over 100 beats a minute.  You have trouble breathing. This information is not intended to replace advice given to you by your health care provider. Make sure you discuss any questions you have with your health care provider. Document Released: 07/03/2005 Document Revised: 01/28/2016 Document Reviewed: 08/27/2015 Elsevier Interactive Patient Education  2019 [employer].  Hypomagnesemia Hypomagnesemia is a condition in which the level of magnesium in the blood is low. Magnesium is a mineral that is found in many foods. It is used in many different processes in the body. Hypomagnesemia can affect every organ in the body. In severe cases, it can cause life-threatening problems. What are the causes? This condition may be caused by:  Not getting enough magnesium in your diet.  Malnutrition.  Problems with absorbing magnesium from the intestines.  Dehydration.  Alcohol abuse.  Vomiting.  Severe or chronic diarrhea.  Some medicines, including medicines that make you urinate more (diuretics).  Certain  diseases, such as kidney disease, diabetes, celiac disease, and overactive thyroid. What are the signs or symptoms? Symptoms of this condition include:  Loss of appetite.  Nausea and vomiting.  Involuntary shaking or trembling of a body part (tremor).  Muscle weakness.  Tingling in the arms and legs.  Sudden tightening of muscles (muscle spasms).  Confusion.  Psychiatric issues, such as depression, irritability, or psychosis.  A feeling of fluttering of the heart.  Seizures. These symptoms are more severe if magnesium levels drop suddenly. How is this diagnosed? This condition may be diagnosed based on:  Your symptoms and medical history.  A physical exam.  Blood and urine  tests. How is this treated? Treatment depends on the cause and the severity of the condition. It may be treated with:  A magnesium supplement. This can be taken in pill form. If the condition is severe, magnesium is usually given through an IV.  Changes to your diet. You may be directed to eat foods that have a lot of magnesium, such as green leafy vegetables, peas, beans, and nuts.  Stopping any intake of alcohol. Follow these instructions at home:      Make sure that your diet includes foods with magnesium. Foods that have a lot of magnesium in them include: ? Green leafy vegetables, such as spinach and broccoli. ? Beans and peas. ? Nuts and seeds, such as almonds and sunflower seeds. ? Whole grains, such as whole grain bread and fortified cereals.  Take magnesium supplements if your health care provider tells you to do that. Take them as directed.  Take over-the-counter and prescription medicines only as told by your health care provider.  Have your magnesium levels monitored as told by your health care provider.  When you are active, drink fluids that contain electrolytes.  Avoid drinking alcohol.  Keep all follow-up visits as told by your health care provider. This is important. Contact a health care provider if:  You get worse instead of better.  Your symptoms return. Get help right away if you:  Develop severe muscle weakness.  Have trouble breathing.  Feel that your heart is racing. Summary  Hypomagnesemia is a condition in which the level of magnesium in the blood is low.  Hypomagnesemia can affect every organ in the body.  Treatment may include eating more foods that contain magnesium, taking magnesium supplements, and not drinking alcohol.  Have your magnesium levels monitored as told by your health care provider. This information is not intended to replace advice given to you by your health care provider. Make sure you discuss any questions you have  with your health care provider. Document Released: 03/29/2005 Document Revised: 06/04/2017 Document Reviewed: 06/04/2017 Elsevier Interactive Patient Education  2019 [employer].

## 2018-11-23 ENCOUNTER — Other Ambulatory Visit: Payer: Self-pay

## 2018-11-23 ENCOUNTER — Inpatient Hospital Stay: Payer: Medicare Other

## 2018-11-23 VITALS — BP 115/80 | HR 86 | Temp 98.9°F | Resp 16

## 2018-11-23 DIAGNOSIS — N179 Acute kidney failure, unspecified: Secondary | ICD-10-CM

## 2018-11-23 DIAGNOSIS — N183 Chronic kidney disease, stage 3 (moderate): Secondary | ICD-10-CM

## 2018-11-23 DIAGNOSIS — C3431 Malignant neoplasm of lower lobe, right bronchus or lung: Secondary | ICD-10-CM | POA: Diagnosis not present

## 2018-11-23 MED ORDER — HEPARIN SOD (PORK) LOCK FLUSH 100 UNIT/ML IV SOLN
500.0000 [IU] | Freq: Once | INTRAVENOUS | Status: AC | PRN
  Administered 2018-11-23: 11:00:00 500 [IU]
  Filled 2018-11-23: qty 5

## 2018-11-23 MED ORDER — MAGNESIUM SULFATE 4 GM/100ML IV SOLN
4.0000 g | Freq: Once | INTRAVENOUS | Status: AC
  Administered 2018-11-23: 4 g via INTRAVENOUS

## 2018-11-23 MED ORDER — SODIUM CHLORIDE 0.9 % IV SOLN
Freq: Once | INTRAVENOUS | Status: AC
  Administered 2018-11-23: 08:00:00 via INTRAVENOUS
  Filled 2018-11-23: qty 250

## 2018-11-23 MED ORDER — SODIUM CHLORIDE 0.9% FLUSH
10.0000 mL | Freq: Once | INTRAVENOUS | Status: AC | PRN
  Administered 2018-11-23: 11:00:00 10 mL
  Filled 2018-11-23: qty 10

## 2018-11-23 MED ORDER — SODIUM CHLORIDE 0.9 % IV SOLN
INTRAVENOUS | Status: DC
  Administered 2018-11-23: 08:00:00 via INTRAVENOUS
  Filled 2018-11-23: qty 250

## 2018-11-23 NOTE — Patient Instructions (Signed)

## 2018-11-25 ENCOUNTER — Other Ambulatory Visit: Payer: Medicare Other

## 2018-11-25 ENCOUNTER — Ambulatory Visit: Payer: Medicare Other | Admitting: Physician Assistant

## 2018-11-26 ENCOUNTER — Inpatient Hospital Stay: Payer: Medicare Other

## 2018-11-26 ENCOUNTER — Ambulatory Visit: Payer: Medicare Other

## 2018-11-26 ENCOUNTER — Other Ambulatory Visit: Payer: Self-pay | Admitting: Medical Oncology

## 2018-11-26 ENCOUNTER — Telehealth: Payer: Self-pay | Admitting: Medical Oncology

## 2018-11-26 ENCOUNTER — Other Ambulatory Visit: Payer: Self-pay

## 2018-11-26 DIAGNOSIS — C3431 Malignant neoplasm of lower lobe, right bronchus or lung: Secondary | ICD-10-CM

## 2018-11-26 DIAGNOSIS — D62 Acute posthemorrhagic anemia: Secondary | ICD-10-CM

## 2018-11-26 LAB — CBC WITH DIFFERENTIAL (CANCER CENTER ONLY)
Abs Immature Granulocytes: 0.19 10*3/uL — ABNORMAL HIGH (ref 0.00–0.07)
Basophils Absolute: 0 10*3/uL (ref 0.0–0.1)
Basophils Relative: 0 %
Eosinophils Absolute: 0 10*3/uL (ref 0.0–0.5)
Eosinophils Relative: 0 %
HCT: 22 % — ABNORMAL LOW (ref 36.0–46.0)
Hemoglobin: 6.9 g/dL — CL (ref 12.0–15.0)
Immature Granulocytes: 4 %
Lymphocytes Relative: 10 %
Lymphs Abs: 0.5 10*3/uL — ABNORMAL LOW (ref 0.7–4.0)
MCH: 29.7 pg (ref 26.0–34.0)
MCHC: 31.4 g/dL (ref 30.0–36.0)
MCV: 94.8 fL (ref 80.0–100.0)
Monocytes Absolute: 0.5 10*3/uL (ref 0.1–1.0)
Monocytes Relative: 11 %
Neutro Abs: 3.8 10*3/uL (ref 1.7–7.7)
Neutrophils Relative %: 75 %
Platelet Count: 37 10*3/uL — ABNORMAL LOW (ref 150–400)
RBC: 2.32 MIL/uL — ABNORMAL LOW (ref 3.87–5.11)
RDW: 23.1 % — ABNORMAL HIGH (ref 11.5–15.5)
WBC Count: 5.1 10*3/uL (ref 4.0–10.5)
nRBC: 0.4 % — ABNORMAL HIGH (ref 0.0–0.2)

## 2018-11-26 LAB — CMP (CANCER CENTER ONLY)
ALT: 10 U/L (ref 0–44)
AST: 10 U/L — ABNORMAL LOW (ref 15–41)
Albumin: 3.3 g/dL — ABNORMAL LOW (ref 3.5–5.0)
Alkaline Phosphatase: 108 U/L (ref 38–126)
Anion gap: 8 (ref 5–15)
BUN: 16 mg/dL (ref 8–23)
CO2: 26 mmol/L (ref 22–32)
Calcium: 8.7 mg/dL — ABNORMAL LOW (ref 8.9–10.3)
Chloride: 106 mmol/L (ref 98–111)
Creatinine: 1.8 mg/dL — ABNORMAL HIGH (ref 0.44–1.00)
GFR, Est AFR Am: 33 mL/min — ABNORMAL LOW (ref >60)
GFR, Estimated: 29 mL/min — ABNORMAL LOW (ref >60)
Glucose, Bld: 94 mg/dL (ref 70–99)
Potassium: 4.5 mmol/L (ref 3.5–5.1)
Sodium: 140 mmol/L (ref 135–145)
Total Bilirubin: <0.2 mg/dL — ABNORMAL LOW (ref 0.3–1.2)
Total Protein: 5.7 g/dL — ABNORMAL LOW (ref 6.5–8.1)

## 2018-11-26 LAB — MAGNESIUM: Magnesium: 1.8 mg/dL (ref 1.7–2.4)

## 2018-11-26 NOTE — Telephone Encounter (Signed)
Anemia-Per Mohamed pt needs 2 units of blood . Pt notified and orders placed.

## 2018-11-27 ENCOUNTER — Other Ambulatory Visit: Payer: Self-pay | Admitting: Medical Oncology

## 2018-11-27 ENCOUNTER — Inpatient Hospital Stay: Payer: Medicare Other

## 2018-11-27 ENCOUNTER — Other Ambulatory Visit: Payer: Self-pay

## 2018-11-27 ENCOUNTER — Ambulatory Visit: Payer: Medicare Other

## 2018-11-27 DIAGNOSIS — D62 Acute posthemorrhagic anemia: Secondary | ICD-10-CM

## 2018-11-27 DIAGNOSIS — C3431 Malignant neoplasm of lower lobe, right bronchus or lung: Secondary | ICD-10-CM

## 2018-11-27 LAB — PREPARE RBC (CROSSMATCH)

## 2018-11-27 MED ORDER — HEPARIN SOD (PORK) LOCK FLUSH 100 UNIT/ML IV SOLN
500.0000 [IU] | Freq: Once | INTRAVENOUS | Status: AC
  Administered 2018-11-27: 17:00:00 500 [IU] via INTRAVENOUS
  Filled 2018-11-27: qty 5

## 2018-11-27 MED ORDER — DIPHENHYDRAMINE HCL 25 MG PO CAPS
ORAL_CAPSULE | ORAL | Status: AC
  Filled 2018-11-27: qty 1

## 2018-11-27 MED ORDER — SODIUM CHLORIDE 0.9% FLUSH
10.0000 mL | Freq: Once | INTRAVENOUS | Status: AC
  Administered 2018-11-27: 17:00:00 10 mL
  Filled 2018-11-27: qty 10

## 2018-11-27 MED ORDER — ACETAMINOPHEN 325 MG PO TABS
650.0000 mg | ORAL_TABLET | Freq: Once | ORAL | Status: AC
  Administered 2018-11-27: 14:00:00 650 mg via ORAL

## 2018-11-27 MED ORDER — DIPHENHYDRAMINE HCL 25 MG PO CAPS
25.0000 mg | ORAL_CAPSULE | Freq: Once | ORAL | Status: AC
  Administered 2018-11-27: 14:00:00 25 mg via ORAL

## 2018-11-27 MED ORDER — ACETAMINOPHEN 325 MG PO TABS
ORAL_TABLET | ORAL | Status: AC
  Filled 2018-11-27: qty 2

## 2018-11-27 MED ORDER — SODIUM CHLORIDE 0.9% IV SOLUTION
250.0000 mL | Freq: Once | INTRAVENOUS | Status: AC
  Administered 2018-11-27: 14:00:00 250 mL via INTRAVENOUS
  Filled 2018-11-27: qty 250

## 2018-11-27 NOTE — Patient Instructions (Signed)
Blood Transfusion, Adult, Care After This sheet gives you information about how to care for yourself after your procedure. Your doctor may also give you more specific instructions. If you have problems or questions, contact your doctor. Follow these instructions at home:   Take over-the-counter and prescription medicines only as told by your doctor.  Go back to your normal activities as told by your doctor.  Follow instructions from your doctor about how to take care of the area where an IV tube was put into your vein (insertion site). Make sure you: ? Wash your hands with soap and water before you change your bandage (dressing). If there is no soap and water, use hand sanitizer. ? Change your bandage as told by your doctor.  Check your IV insertion site every day for signs of infection. Check for: ? More redness, swelling, or pain. ? More fluid or blood. ? Warmth. ? Pus or a bad smell. Contact a doctor if:  You have more redness, swelling, or pain around the IV insertion site.  You have more fluid or blood coming from the IV insertion site.  Your IV insertion site feels warm to the touch.  You have pus or a bad smell coming from the IV insertion site.  Your pee (urine) turns pink, red, or brown.  You feel weak after doing your normal activities. Get help right away if:  You have signs of a serious allergic or body defense (immune) system reaction, including: ? Itchiness. ? Hives. ? Trouble breathing. ? Anxiety. ? Pain in your chest or lower back. ? Fever, flushing, and chills. ? Fast pulse. ? Rash. ? Watery poop (diarrhea). ? Throwing up (vomiting). ? Dark pee. ? Serious headache. ? Dizziness. ? Stiff neck. ? Yellow color in your face or the white parts of your eyes (jaundice). Summary  After a blood transfusion, return to your normal activities as told by your doctor.  Every day, check for signs of infection where the IV tube was put into your vein.  Some  signs of infection are warm skin, more redness and pain, more fluid or blood, and pus or a bad smell where the needle went in.  Contact your doctor if you feel weak or have any unusual symptoms. This information is not intended to replace advice given to you by your health care provider. Make sure you discuss any questions you have with your health care provider. Document Released: 07/24/2014 Document Revised: 02/25/2016 Document Reviewed: 02/25/2016 Elsevier Interactive Patient Education  2019 [employer]  

## 2018-11-28 ENCOUNTER — Encounter: Payer: Self-pay | Admitting: Nutrition

## 2018-11-28 ENCOUNTER — Inpatient Hospital Stay: Payer: Medicare Other

## 2018-11-28 ENCOUNTER — Other Ambulatory Visit: Payer: Self-pay

## 2018-11-28 ENCOUNTER — Ambulatory Visit: Payer: Medicare Other

## 2018-11-28 DIAGNOSIS — N183 Chronic kidney disease, stage 3 (moderate): Secondary | ICD-10-CM

## 2018-11-28 DIAGNOSIS — C3431 Malignant neoplasm of lower lobe, right bronchus or lung: Secondary | ICD-10-CM | POA: Diagnosis not present

## 2018-11-28 DIAGNOSIS — N179 Acute kidney failure, unspecified: Secondary | ICD-10-CM

## 2018-11-28 LAB — CBC WITH DIFFERENTIAL (CANCER CENTER ONLY)
Abs Immature Granulocytes: 0.07 10*3/uL (ref 0.00–0.07)
Basophils Absolute: 0 10*3/uL (ref 0.0–0.1)
Basophils Relative: 1 %
Eosinophils Absolute: 0 10*3/uL (ref 0.0–0.5)
Eosinophils Relative: 0 %
HCT: 23.8 % — ABNORMAL LOW (ref 36.0–46.0)
Hemoglobin: 7.7 g/dL — ABNORMAL LOW (ref 12.0–15.0)
Immature Granulocytes: 1 %
Lymphocytes Relative: 9 %
Lymphs Abs: 0.5 10*3/uL — ABNORMAL LOW (ref 0.7–4.0)
MCH: 29.8 pg (ref 26.0–34.0)
MCHC: 32.4 g/dL (ref 30.0–36.0)
MCV: 92.2 fL (ref 80.0–100.0)
Monocytes Absolute: 0.6 10*3/uL (ref 0.1–1.0)
Monocytes Relative: 10 %
Neutro Abs: 4.5 10*3/uL (ref 1.7–7.7)
Neutrophils Relative %: 79 %
Platelet Count: 55 10*3/uL — ABNORMAL LOW (ref 150–400)
RBC: 2.58 MIL/uL — ABNORMAL LOW (ref 3.87–5.11)
RDW: 21.5 % — ABNORMAL HIGH (ref 11.5–15.5)
WBC Count: 5.7 10*3/uL (ref 4.0–10.5)
nRBC: 0 % (ref 0.0–0.2)

## 2018-11-28 LAB — BPAM RBC
Blood Product Expiration Date: 202005312359
ISSUE DATE / TIME: 202005131419
Unit Type and Rh: 6200

## 2018-11-28 LAB — MAGNESIUM: Magnesium: 1.6 mg/dL — ABNORMAL LOW (ref 1.7–2.4)

## 2018-11-28 LAB — TYPE AND SCREEN
ABO/RH(D): A POS
Antibody Screen: NEGATIVE
Unit division: 0

## 2018-11-28 LAB — CMP (CANCER CENTER ONLY)
ALT: 7 U/L (ref 0–44)
AST: 9 U/L — ABNORMAL LOW (ref 15–41)
Albumin: 3.2 g/dL — ABNORMAL LOW (ref 3.5–5.0)
Alkaline Phosphatase: 112 U/L (ref 38–126)
Anion gap: 7 (ref 5–15)
BUN: 16 mg/dL (ref 8–23)
CO2: 26 mmol/L (ref 22–32)
Calcium: 8.5 mg/dL — ABNORMAL LOW (ref 8.9–10.3)
Chloride: 106 mmol/L (ref 98–111)
Creatinine: 1.73 mg/dL — ABNORMAL HIGH (ref 0.44–1.00)
GFR, Est AFR Am: 35 mL/min — ABNORMAL LOW (ref >60)
GFR, Estimated: 30 mL/min — ABNORMAL LOW (ref >60)
Glucose, Bld: 86 mg/dL (ref 70–99)
Potassium: 4.2 mmol/L (ref 3.5–5.1)
Sodium: 139 mmol/L (ref 135–145)
Total Bilirubin: <0.2 mg/dL — ABNORMAL LOW (ref 0.3–1.2)
Total Protein: 5.7 g/dL — ABNORMAL LOW (ref 6.5–8.1)

## 2018-11-28 MED ORDER — SODIUM CHLORIDE 0.9% FLUSH
10.0000 mL | Freq: Once | INTRAVENOUS | Status: AC | PRN
  Administered 2018-11-28: 10 mL
  Filled 2018-11-28: qty 10

## 2018-11-28 MED ORDER — HEPARIN SOD (PORK) LOCK FLUSH 100 UNIT/ML IV SOLN
500.0000 [IU] | Freq: Once | INTRAVENOUS | Status: AC | PRN
  Administered 2018-11-28: 500 [IU]
  Filled 2018-11-28: qty 5

## 2018-11-28 MED ORDER — SODIUM CHLORIDE 0.9% FLUSH
10.0000 mL | Freq: Once | INTRAVENOUS | Status: AC | PRN
  Administered 2018-11-28: 13:00:00 10 mL
  Filled 2018-11-28: qty 10

## 2018-11-28 NOTE — Progress Notes (Signed)
Pt's Hgb 7.7. Pt denies SOB, dizziness, increased weakness.  Pt states she feels better after she received her unit of blood yesterday.  Spoke to Dr. Julien Nordmann. Pt does not need blood transfusion today.  Pt given copy of labs. Port deaccessed.

## 2018-11-28 NOTE — Progress Notes (Signed)
RD working remotely.  Patient cancelled nutrition follow up and did not want to reschedule.

## 2018-12-02 ENCOUNTER — Inpatient Hospital Stay (HOSPITAL_BASED_OUTPATIENT_CLINIC_OR_DEPARTMENT_OTHER): Payer: Medicare Other | Admitting: Physician Assistant

## 2018-12-02 ENCOUNTER — Telehealth: Payer: Self-pay | Admitting: Physician Assistant

## 2018-12-02 ENCOUNTER — Other Ambulatory Visit: Payer: Self-pay

## 2018-12-02 ENCOUNTER — Inpatient Hospital Stay: Payer: Medicare Other

## 2018-12-02 ENCOUNTER — Encounter: Payer: Self-pay | Admitting: Physician Assistant

## 2018-12-02 VITALS — BP 133/82 | HR 75

## 2018-12-02 VITALS — BP 172/103 | HR 91 | Temp 98.9°F | Resp 17 | Ht 65.0 in | Wt 188.2 lb

## 2018-12-02 DIAGNOSIS — N179 Acute kidney failure, unspecified: Secondary | ICD-10-CM

## 2018-12-02 DIAGNOSIS — J439 Emphysema, unspecified: Secondary | ICD-10-CM

## 2018-12-02 DIAGNOSIS — M1990 Unspecified osteoarthritis, unspecified site: Secondary | ICD-10-CM

## 2018-12-02 DIAGNOSIS — R5383 Other fatigue: Secondary | ICD-10-CM

## 2018-12-02 DIAGNOSIS — C3431 Malignant neoplasm of lower lobe, right bronchus or lung: Secondary | ICD-10-CM | POA: Diagnosis not present

## 2018-12-02 DIAGNOSIS — E785 Hyperlipidemia, unspecified: Secondary | ICD-10-CM

## 2018-12-02 DIAGNOSIS — Z923 Personal history of irradiation: Secondary | ICD-10-CM

## 2018-12-02 DIAGNOSIS — D61818 Other pancytopenia: Secondary | ICD-10-CM | POA: Diagnosis not present

## 2018-12-02 DIAGNOSIS — N183 Chronic kidney disease, stage 3 (moderate): Secondary | ICD-10-CM

## 2018-12-02 DIAGNOSIS — I10 Essential (primary) hypertension: Secondary | ICD-10-CM

## 2018-12-02 DIAGNOSIS — Z79899 Other long term (current) drug therapy: Secondary | ICD-10-CM

## 2018-12-02 DIAGNOSIS — Z8673 Personal history of transient ischemic attack (TIA), and cerebral infarction without residual deficits: Secondary | ICD-10-CM

## 2018-12-02 DIAGNOSIS — I700 Atherosclerosis of aorta: Secondary | ICD-10-CM

## 2018-12-02 DIAGNOSIS — Z5111 Encounter for antineoplastic chemotherapy: Secondary | ICD-10-CM

## 2018-12-02 DIAGNOSIS — E878 Other disorders of electrolyte and fluid balance, not elsewhere classified: Secondary | ICD-10-CM

## 2018-12-02 DIAGNOSIS — C781 Secondary malignant neoplasm of mediastinum: Secondary | ICD-10-CM

## 2018-12-02 DIAGNOSIS — R634 Abnormal weight loss: Secondary | ICD-10-CM

## 2018-12-02 LAB — CMP (CANCER CENTER ONLY)
ALT: 8 U/L (ref 0–44)
AST: 9 U/L — ABNORMAL LOW (ref 15–41)
Albumin: 3.1 g/dL — ABNORMAL LOW (ref 3.5–5.0)
Alkaline Phosphatase: 90 U/L (ref 38–126)
Anion gap: 8 (ref 5–15)
BUN: 14 mg/dL (ref 8–23)
CO2: 25 mmol/L (ref 22–32)
Calcium: 8.6 mg/dL — ABNORMAL LOW (ref 8.9–10.3)
Chloride: 107 mmol/L (ref 98–111)
Creatinine: 1.55 mg/dL — ABNORMAL HIGH (ref 0.44–1.00)
GFR, Est AFR Am: 40 mL/min — ABNORMAL LOW (ref >60)
GFR, Estimated: 35 mL/min — ABNORMAL LOW (ref >60)
Glucose, Bld: 93 mg/dL (ref 70–99)
Potassium: 4.3 mmol/L (ref 3.5–5.1)
Sodium: 140 mmol/L (ref 135–145)
Total Bilirubin: 0.2 mg/dL — ABNORMAL LOW (ref 0.3–1.2)
Total Protein: 5.5 g/dL — ABNORMAL LOW (ref 6.5–8.1)

## 2018-12-02 LAB — CBC WITH DIFFERENTIAL (CANCER CENTER ONLY)
Abs Immature Granulocytes: 0.03 10*3/uL (ref 0.00–0.07)
Basophils Absolute: 0 10*3/uL (ref 0.0–0.1)
Basophils Relative: 0 %
Eosinophils Absolute: 0 10*3/uL (ref 0.0–0.5)
Eosinophils Relative: 0 %
HCT: 23.5 % — ABNORMAL LOW (ref 36.0–46.0)
Hemoglobin: 7.4 g/dL — ABNORMAL LOW (ref 12.0–15.0)
Immature Granulocytes: 1 %
Lymphocytes Relative: 8 %
Lymphs Abs: 0.4 10*3/uL — ABNORMAL LOW (ref 0.7–4.0)
MCH: 30.1 pg (ref 26.0–34.0)
MCHC: 31.5 g/dL (ref 30.0–36.0)
MCV: 95.5 fL (ref 80.0–100.0)
Monocytes Absolute: 0.5 10*3/uL (ref 0.1–1.0)
Monocytes Relative: 9 %
Neutro Abs: 4.4 10*3/uL (ref 1.7–7.7)
Neutrophils Relative %: 82 %
Platelet Count: 101 10*3/uL — ABNORMAL LOW (ref 150–400)
RBC: 2.46 MIL/uL — ABNORMAL LOW (ref 3.87–5.11)
RDW: 22.2 % — ABNORMAL HIGH (ref 11.5–15.5)
WBC Count: 5.3 10*3/uL (ref 4.0–10.5)
nRBC: 0 % (ref 0.0–0.2)

## 2018-12-02 LAB — MAGNESIUM: Magnesium: 1.6 mg/dL — ABNORMAL LOW (ref 1.7–2.4)

## 2018-12-02 MED ORDER — SODIUM CHLORIDE 0.9% FLUSH
10.0000 mL | Freq: Once | INTRAVENOUS | Status: AC | PRN
  Administered 2018-12-02: 09:00:00 10 mL
  Filled 2018-12-02: qty 10

## 2018-12-02 MED ORDER — SODIUM CHLORIDE 0.9 % IV SOLN
Freq: Once | INTRAVENOUS | Status: AC
  Administered 2018-12-02: 11:00:00 via INTRAVENOUS
  Filled 2018-12-02: qty 250

## 2018-12-02 MED ORDER — CLONIDINE HCL 0.1 MG PO TABS
ORAL_TABLET | ORAL | Status: AC
  Filled 2018-12-02: qty 1

## 2018-12-02 MED ORDER — SODIUM CHLORIDE 0.9% FLUSH
10.0000 mL | INTRAVENOUS | Status: DC | PRN
  Administered 2018-12-02: 18:00:00 10 mL
  Filled 2018-12-02: qty 10

## 2018-12-02 MED ORDER — PALONOSETRON HCL INJECTION 0.25 MG/5ML
0.2500 mg | Freq: Once | INTRAVENOUS | Status: AC
  Administered 2018-12-02: 0.25 mg via INTRAVENOUS

## 2018-12-02 MED ORDER — SODIUM CHLORIDE 0.9 % IV SOLN
40.0000 mg/m2 | Freq: Once | INTRAVENOUS | Status: AC
  Administered 2018-12-02: 82 mg via INTRAVENOUS
  Filled 2018-12-02: qty 82

## 2018-12-02 MED ORDER — HEPARIN SOD (PORK) LOCK FLUSH 100 UNIT/ML IV SOLN
500.0000 [IU] | Freq: Once | INTRAVENOUS | Status: AC | PRN
  Administered 2018-12-02: 18:00:00 500 [IU]
  Filled 2018-12-02: qty 5

## 2018-12-02 MED ORDER — SODIUM CHLORIDE 0.9 % IV SOLN
Freq: Once | INTRAVENOUS | Status: AC
  Administered 2018-12-02: 13:00:00 via INTRAVENOUS
  Filled 2018-12-02: qty 5

## 2018-12-02 MED ORDER — POTASSIUM CHLORIDE 2 MEQ/ML IV SOLN
Freq: Once | INTRAVENOUS | Status: AC
  Administered 2018-12-02: 11:00:00 via INTRAVENOUS
  Filled 2018-12-02: qty 10

## 2018-12-02 MED ORDER — CLONIDINE HCL 0.1 MG PO TABS
0.1000 mg | ORAL_TABLET | Freq: Once | ORAL | Status: AC
  Administered 2018-12-02: 0.1 mg via ORAL

## 2018-12-02 MED ORDER — PALONOSETRON HCL INJECTION 0.25 MG/5ML
INTRAVENOUS | Status: AC
  Filled 2018-12-02: qty 5

## 2018-12-02 MED ORDER — CLONIDINE HCL 0.1 MG PO TABS: 0.2000 mg | ORAL_TABLET | Freq: Once | ORAL | Status: DC

## 2018-12-02 MED ORDER — SODIUM CHLORIDE 0.9 % IV SOLN
80.0000 mg/m2 | Freq: Once | INTRAVENOUS | Status: AC
  Administered 2018-12-02: 15:00:00 160 mg via INTRAVENOUS
  Filled 2018-12-02: qty 8

## 2018-12-02 NOTE — Telephone Encounter (Signed)
No 5/18 LOS

## 2018-12-02 NOTE — Progress Notes (Signed)
Ok to treat per [employer] [place] today.  Orders for additional fluids +mg and clonidine for BP to be added to [facility].

## 2018-12-02 NOTE — Progress Notes (Signed)
[facility] OFFICE PROGRESS NOTE  Rutherford Guys, MD [address] [name] [address]  DIAGNOSIS: Limited stage (T2b, N2,M0) small cell lung cancerdiagnosed inJanuary 2020. She presented with right lower lobe lung mass in addition to right hilar and mediastinal lymphadenopathy  PRIOR THERAPY: None  CURRENT THERAPY: Systemic chemotherapy concurrent with radiation. Chemotherapy withcisplatin 80 mg/M2 on day 1 and etoposide 100 mg/M2 on days 1, 2 and 3 every 3 weeks.First dose September 03, 2018. Status post4cycles.Dose reduced starting from cycle #4 to cisplatin 40 mg/m2 and etoposide 80 mg/m2 due to renal insufficiency.   INTERVAL HISTORY: [name] 68 y.o. female returns to the clinic for a follow-up visit.  The patient is feeling fairly well today and tolerated her last treatment except for pancytopenia and electrolyte abnormalities. Her routine weekly labs recently showed hypomagnesemia, acute kidney injury, and anemia for which she received 1 unit of pRBCs and 2 doses of 4 g of IV magnesium sulfate. She takes iron supplements which causes dark stool but she denies any known bleeding or bruising including melena, hematochezia, epistaxis, or gingival bleeding. She also takes magnesium oxide 3 times daily.  Her dose of chemotherapy was reduced starting cycle #4 due to her increased creatinine.  Otherwise she denies any fever, chills, night sweats.  She does endorse fatigue and weight loss.  She is currently in contact with our nutritionist regarding her weight loss.  She denies any chest pain, shortness of breath, cough, or hemoptysis.  She denies any nausea, vomiting, diarrhea, or constipation. She denies any headache or visual changes.  The patient recently had a restaging CT scan.  She is here today for evaluation before starting cycle #5.  MEDICAL HISTORY: Past Medical History:  Diagnosis Date  . Arthritis   . Chronic pain   . Hyperlipidemia   .  Hypertension   . Low blood magnesium 10/04/2018  . lung ca dx'd 06/2018  . Stroke ([place])    06/19/2018 minor    ALLERGIES:  has No Known Allergies.  MEDICATIONS:  Current Outpatient Medications  Medication Sig Dispense Refill  . acetaminophen (TYLENOL) 650 MG CR tablet Take 650 mg by mouth every 8 (eight) hours as needed for pain.    Marland Kitchen atorvastatin (LIPITOR) 80 MG tablet Take 1 tablet (80 mg total) by mouth daily at 6 PM. 90 tablet 3  . feeding supplement, ENSURE ENLIVE, (ENSURE ENLIVE) LIQD Take 237 mLs by mouth 2 (two) times daily between meals. 60 Bottle 0  . ferrous sulfate 325 (65 FE) MG tablet Take 325 mg by mouth daily with breakfast.    . lidocaine-prilocaine (EMLA) cream Apply 1 application topically as needed. (Patient taking differently: Apply 1 application topically as needed (port access). ) 30 g 0  . lisinopril (ZESTRIL) 40 MG tablet TAKE 1 TABLET BY MOUTH EVERY DAY 90 tablet 0  . magnesium oxide (MAG-OX) 400 MG tablet TAKE 1 TABLET BY MOUTH 3 TIMES A DAY (Patient taking differently: Take 400 mg by mouth 4 (four) times daily. ) 90 tablet 0  . oxyCODONE-acetaminophen (PERCOCET/ROXICET) 5-325 MG tablet Take 1 tablet by mouth every 8 (eight) hours as needed for severe pain. 15 tablet 0  . pantoprazole (PROTONIX) 40 MG tablet TAKE 1 TABLET (40 MG TOTAL) BY MOUTH 2 (TWO) TIMES DAILY BEFORE A MEAL. 180 tablet 1  . potassium chloride SA (K-DUR) 20 MEQ tablet Take 1 tablet (20 mEq total) by mouth daily. 5 tablet 0  . prochlorperazine (COMPAZINE) 10 MG tablet  Take 1 tablet (10 mg total) by mouth every 6 (six) hours as needed for nausea or vomiting. 30 tablet 1  . butalbital-acetaminophen-caffeine (FIORICET) 50-325-40 MG tablet Take 1-2 tablets by mouth every 6 (six) hours as needed for headache. (Patient not taking: Reported on 12/02/2018) 20 tablet 0  . ondansetron (ZOFRAN) 4 MG tablet Take 1 tablet (4 mg total) by mouth every 8 (eight) hours as needed for nausea or vomiting (give 2nd).  (Patient not taking: Reported on 11/12/2018) 30 tablet 0  . sucralfate (CARAFATE) 1 GM/10ML suspension Take 10 mLs (1 g total) by mouth 4 (four) times daily -  with meals and at bedtime. (Patient not taking: Reported on 12/02/2018) 420 mL 0   No current facility-administered medications for this visit.    Facility-Administered Medications Ordered in Other Visits  Medication Dose Route Frequency Provider Last Rate Last Dose  . CISplatin (PLATINOL) 82 mg in sodium chloride 0.9 % 250 mL chemo infusion  40 mg/m2 (Treatment Plan Recorded) Intravenous Once Curt Bears, MD      . etoposide (VEPESID) 160 mg in sodium chloride 0.9 % 500 mL chemo infusion  80 mg/m2 (Treatment Plan Recorded) Intravenous Once Curt Bears, MD      . fosaprepitant (EMEND) 150 mg, dexamethasone (DECADRON) 12 mg in sodium chloride 0.9 % 145 mL IVPB   Intravenous Once Curt Bears, MD      . heparin lock flush 100 unit/mL  500 Units Intracatheter Once PRN Curt Bears, MD      . palonosetron (ALOXI) injection 0.25 mg  0.25 mg Intravenous Once Curt Bears, MD      . sodium chloride flush (NS) 0.9 % injection 10 mL  10 mL Intracatheter PRN Curt Bears, MD        SURGICAL HISTORY:  Past Surgical History:  Procedure Laterality Date  . BIOPSY  06/19/2018   Procedure: BIOPSY;  Surgeon: Laurence Spates, MD;  Location: [facility];  Service: Endoscopy;;  . BIOPSY  10/11/2018   Procedure: BIOPSY;  Surgeon: Laurence Spates, MD;  Location: [facility];  Service: Endoscopy;;  . ENDARTERECTOMY Right 06/26/2018   Procedure: ENDARTERECTOMY CAROTID RIGHT;  Surgeon: Serafina Mitchell, MD;  Location: [facility] OR;  Service: Vascular;  Laterality: Right;  . ESOPHAGOGASTRODUODENOSCOPY (EGD) WITH PROPOFOL N/A 06/19/2018   Procedure: ESOPHAGOGASTRODUODENOSCOPY (EGD) WITH PROPOFOL;  Surgeon: Laurence Spates, MD;  Location: [facility];  Service: Endoscopy;  Laterality: N/A;  . FLEXIBLE SIGMOIDOSCOPY N/A 10/11/2018   Procedure:  FLEXIBLE SIGMOIDOSCOPY;  Surgeon: Laurence Spates, MD;  Location: [facility];  Service: Endoscopy;  Laterality: N/A;  . IR IMAGING GUIDED PORT INSERTION  10/07/2018  . PATCH ANGIOPLASTY Right 06/26/2018   Procedure: PATCH ANGIOPLASTY USING A XENOSURE 1cm x 6cm BIOLOGIC PATCH;  Surgeon: Serafina Mitchell, MD;  Location: [place];  Service: Vascular;  Laterality: Right;  Marland Kitchen VIDEO BRONCHOSCOPY WITH ENDOBRONCHIAL ULTRASOUND N/A 08/14/2018   Procedure: VIDEO BRONCHOSCOPY WITH ENDOBRONCHIAL ULTRASOUND;  Surgeon: Collene Gobble, MD;  Location: [facility] OR;  Service: Thoracic;  Laterality: N/A;    REVIEW OF SYSTEMS:   Review of Systems  Constitutional: Positive for appetite change, weight loss, and fatigue. Negative for chills and fever. HENT:  Negative for mouth sores, nosebleeds, sore throat and trouble swallowing.   Eyes: Negative for eye problems and icterus.  Respiratory: Negative for cough, hemoptysis, shortness of breath and wheezing.   Cardiovascular: Negative for chest pain and leg swelling.  Gastrointestinal: Negative for abdominal pain, constipation, diarrhea, nausea and vomiting.  Genitourinary: Negative for  bladder incontinence, difficulty urinating, dysuria, frequency and hematuria.   Musculoskeletal: Positive for chronic back pain. Negative for gait problem, neck pain and neck stiffness.  Skin: Negative for itching and rash.  Neurological: Negative for dizziness, extremity weakness, gait problem, headaches, light-headedness and seizures.  Hematological: Negative for adenopathy. Does not bruise/bleed easily.  Psychiatric/Behavioral: Negative for confusion, depression and sleep disturbance. The patient is not nervous/anxious.     PHYSICAL EXAMINATION:  Blood pressure (!) 172/103, pulse 91, temperature 98.9 F (37.2 C), temperature source Oral, resp. rate 17, height 5\' 5"  (1.651 m), weight 188 lb 3.2 oz (85.4 kg), SpO2 98 %.  ECOG PERFORMANCE STATUS: 1 - Symptomatic but completely  ambulatory  Physical Exam  Constitutional: Oriented to person, place, and time and well-developed, well-nourished, and in no distress.  HENT:  Head: Normocephalic and atraumatic.  Mouth/Throat: Oropharynx is clear and moist. No oropharyngeal exudate.  Eyes: Conjunctivae are normal. Right eye exhibits no discharge. Left eye exhibits no discharge. No scleral icterus.  Neck: Normal range of motion. Neck supple.  Cardiovascular: Normal rate, regular rhythm, normal heart sounds and intact distal pulses.   Pulmonary/Chest: Effort normal and breath sounds normal. No respiratory distress. No wheezes. No rales.  Abdominal: Soft. Bowel sounds are normal. Exhibits no distension and no mass. There is no tenderness.  Musculoskeletal: Normal range of motion. Exhibits no edema.  Lymphadenopathy:    No cervical adenopathy.  Neurological: Alert and oriented to person, place, and time. Exhibits normal muscle tone. Gait normal. Coordination normal.  Skin: Skin is warm and dry. No rash noted. Not diaphoretic. No erythema. No pallor.  Psychiatric: Mood, memory and judgment normal.  Vitals reviewed.  LABORATORY DATA: Lab Results  Component Value Date   WBC 5.3 12/02/2018   HGB 7.4 (L) 12/02/2018   HCT 23.5 (L) 12/02/2018   MCV 95.5 12/02/2018   PLT 101 (L) 12/02/2018      Chemistry      Component Value Date/Time   NA 140 12/02/2018 0824   NA 142 08/02/2018 1042   K 4.3 12/02/2018 0824   CL 107 12/02/2018 0824   CO2 25 12/02/2018 0824   BUN 14 12/02/2018 0824   BUN 9 08/02/2018 1042   CREATININE 1.55 (H) 12/02/2018 0824      Component Value Date/Time   CALCIUM 8.6 (L) 12/02/2018 0824   ALKPHOS 90 12/02/2018 0824   AST 9 (L) 12/02/2018 0824   ALT 8 12/02/2018 0824   BILITOT 0.2 (L) 12/02/2018 0824       RADIOGRAPHIC STUDIES:  Ct Chest Wo Contrast  Result Date: 11/21/2018 CLINICAL DATA:  Ongoing chemotherapy for right lower lobe small cell lung carcinoma, status post radiation therapy.  Restaging. EXAM: CT CHEST WITHOUT CONTRAST TECHNIQUE: Multidetector CT imaging of the chest was performed following the standard protocol without IV contrast. COMPARISON:  10/11/2018 chest CT. FINDINGS: Cardiovascular: Normal heart size. No significant pericardial effusion/thickening. Three-vessel coronary atherosclerosis. Right internal jugular Port-A-Cath terminates at the cavoatrial junction. Atherosclerotic nonaneurysmal thoracic aorta. Normal caliber pulmonary arteries. Mediastinum/Nodes: No discrete thyroid nodules. Unremarkable esophagus. No axillary adenopathy. Enlarged 3.3 cm right paratracheal node (series 2/image 50), previously 3.3 cm, stable. Mildly enlarged 1.6 cm subcarinal node (series 2/image 62), slightly decreased from 1.8 cm. No new pathologically enlarged mediastinal nodes. Mildly enlarged 1.3 cm right infrahilar node (series 2/image 76), previously 1.5 cm, slightly decreased. No discrete left hilar adenopathy on this noncontrast scan. Lungs/Pleura: No pneumothorax. No pleural effusion. Anterior right lower lobe 3.0 x 3.0 cm solid  lung mass (series 5/image 84), previously 3.3 x 3.1 cm, minimally decreased. A few scattered tiny solid pulmonary nodules in both lungs largest 4 mm in the subpleural left lower lobe (series 5/image 83), all stable. No acute consolidative airspace disease or new significant pulmonary nodules. Mild centrilobular emphysema with diffuse bronchial wall thickening. Upper abdomen: Stable left adrenal nodules with indeterminate densities, largest 1.3 cm with density 22 HU, not hypermetabolic on prior PET-CT, probably benign. Musculoskeletal: No aggressive appearing focal osseous lesions. Stable posterior element sclerosis on the right at T5-6, non hypermetabolic on prior PET-CT, presumably degenerative. Moderate thoracic spondylosis. IMPRESSION: 1. Anterior right lower lobe lung mass is slightly decreased. 2. Right hilar and mediastinal lymphadenopathy is stable to slightly  decreased. 3. No evidence of new or progressive metastatic disease in the chest. 4. Stable left adrenal nodules with indeterminate density, previously non hypermetabolic, probably benign. Aortic Atherosclerosis (ICD10-I70.0) and Emphysema (ICD10-J43.9). Electronically Signed   By: Ilona Sorrel M.D.   On: 11/21/2018 17:07     ASSESSMENT/PLAN:  This is a very pleasant 68 year old Caucasian female recently diagnosed with limited stage (T2b, N2, M0) small cell lung cancer.  She presented with a right lower lobe lung mass in addition to right hilar mediastinal lymphadenopathy.  She was diagnosed in January 2020.  The patient is currently undergoing treatment with cisplatin 80 mg on day 1 and etoposide 100 mg/m on days 1, 2, and 3 every 3 weeks. Starting from cycle #4, the cisplatin was reduced to 40 mg/m2 and etoposide 80 mg/m2 due to her renal function. She is status post 4 cycles.  She has experienced pancytopenia and electrolyte abnormalities with her treatment. She also experienced odynophagia and weight loss secondary to radiation treatment.  She completed radiation on November 05, 2018.    The patient recently had a restaging CT scan performed.  Dr. Julien Nordmann personally and independently reviewed the scan and discussed the results with the patient today.  The scan showed a slight decrease in the right lower lobe lung mass and in the hilar and mediastinal lymphadenopathy.  Due to the size of the tumor, Dr. Julien Nordmann recommends that the patient proceed with 1-2 more cycles of treatment. She will recieve cycle #5 today as scheduled.  I will see the patient back for follow-up visit 3 weeks for evaluation and to see how the patient tolerated her last treatment.  If the patient is feeling well, Dr. Julien Nordmann may recommend that the patient proceed with cycle #6 at this time.   The patient was found to be hypertensive today.  We will give her a one-time dose of 0.1 of clonidine.  For her hypomagnesemia, the patient  will receive 2 g of magnesium sulfate added to her fluids today. She will continue to take her magnesium oxide 400 mg TID at home.  The patient's hemoglobin was found to be 7.4 today.  Unfortunately, due to the shortage of blood products, the current guidelines restrict [place] from giving her pRBCs unless her  hemoglobin is less than 7. We will continue to watch the patient's labs very closely.  We will add a type and screen/sample to blood bank to be performed with her next set of labs next week and will arrange for a transfusion if indicated.  The patient was advised to call immediately if she has any concerning symptoms in the interval. The patient voices understanding of current disease status and treatment options and is in agreement with the current care plan. All questions were answered. The  patient knows to call the clinic with any problems, questions or concerns. We can certainly see the patient much sooner if necessary  Orders Placed This Encounter  Procedures  . Sample to Blood Bank    Standing Status:   Future    Standing Expiration Date:   12/02/2019     Cassandra L Heilingoetter, PA-C 12/02/18  ADDENDUM: Hematology/Oncology Attending: I had a face-to-face encounter with the patient today.  I recommended her care plan.  This is a very pleasant 68 years old white female with limited stage small cell lung cancer and currently undergoing systemic chemotherapy with reduced dose cisplatin and etoposide status post 4 cycles.  The patient has been tolerating the treatment well except for chemotherapy-induced pancytopenia as well as renal insufficiency. She received PRBCs transfusion recently.  Her hemoglobin today is 7.4. The patient had repeat CT scan of the chest performed recently.  I personally and independently reviewed the scans and discussed the results with the patient today. Her scan showed stable disease compared to 6 weeks ago. I discussed with the patient 2 options for management  of her condition including proceeding with 1-2 more cycles of the same therapy versus discontinuing the treatment at this point time close monitoring. The patient is interested in proceeding with at least 1 more cycle and she will proceed with cycle #5 today. We will see her back for follow-up visit in 3 weeks for evaluation before proceeding with cycle #6 and a decision will be made at that time whether to proceed with the treatment or not. She was encouraged to increase her oral intake. She was advised to call immediately if she has any concerning symptoms in the interval.  Disclaimer: This note was dictated with voice recognition software. Similar sounding words can inadvertently be transcribed and may be missed upon review. Eilleen Kempf, MD 12/02/18

## 2018-12-02 NOTE — Progress Notes (Signed)
Received verbal order from C. Heilingoetter to add additional 2g Magnesium to pre/post cisplatin fluids. Order has been updated to reflect this.   Jalene Mullet, PharmD PGY2 Hematology/ Oncology Pharmacy Resident 12/02/2018 10:56 AM

## 2018-12-02 NOTE — Patient Instructions (Signed)
[facility] Discharge Instructions for Patients Receiving Chemotherapy  Today you received the following chemotherapy agents :  Cisplatin,  Etoposide.  To help prevent nausea and vomiting after your treatment, we encourage you to take your nausea medication as prescribed.   If you develop nausea and vomiting that is not controlled by your nausea medication, call the clinic.   BELOW ARE SYMPTOMS THAT SHOULD BE REPORTED IMMEDIATELY:  *FEVER GREATER THAN 100.5 F  *CHILLS WITH OR WITHOUT FEVER  NAUSEA AND VOMITING THAT IS NOT CONTROLLED WITH YOUR NAUSEA MEDICATION  *UNUSUAL SHORTNESS OF BREATH  *UNUSUAL BRUISING OR BLEEDING  TENDERNESS IN MOUTH AND THROAT WITH OR WITHOUT PRESENCE OF ULCERS  *URINARY PROBLEMS  *BOWEL PROBLEMS  UNUSUAL RASH Items with * indicate a potential emergency and should be followed up as soon as possible.  Feel free to call the clinic should you have any questions or concerns. The clinic phone number is [phone].  Please show the [place] at check-in to the Emergency Department and triage nurse.

## 2018-12-03 ENCOUNTER — Other Ambulatory Visit: Payer: Self-pay

## 2018-12-03 ENCOUNTER — Inpatient Hospital Stay: Payer: Medicare Other

## 2018-12-03 ENCOUNTER — Telehealth: Payer: Self-pay | Admitting: *Deleted

## 2018-12-03 ENCOUNTER — Telehealth: Payer: Self-pay | Admitting: Physician Assistant

## 2018-12-03 VITALS — BP 149/91 | HR 67 | Temp 98.9°F | Resp 19

## 2018-12-03 DIAGNOSIS — C3431 Malignant neoplasm of lower lobe, right bronchus or lung: Secondary | ICD-10-CM

## 2018-12-03 DIAGNOSIS — I10 Essential (primary) hypertension: Secondary | ICD-10-CM

## 2018-12-03 MED ORDER — HEPARIN SOD (PORK) LOCK FLUSH 100 UNIT/ML IV SOLN
500.0000 [IU] | Freq: Once | INTRAVENOUS | Status: AC | PRN
  Administered 2018-12-03: 16:00:00 500 [IU]
  Filled 2018-12-03: qty 5

## 2018-12-03 MED ORDER — SODIUM CHLORIDE 0.9% FLUSH
10.0000 mL | INTRAVENOUS | Status: DC | PRN
  Administered 2018-12-03: 16:00:00 10 mL
  Filled 2018-12-03: qty 10

## 2018-12-03 MED ORDER — SODIUM CHLORIDE 0.9 % IV SOLN
80.0000 mg/m2 | Freq: Once | INTRAVENOUS | Status: AC
  Administered 2018-12-03: 160 mg via INTRAVENOUS
  Filled 2018-12-03: qty 8

## 2018-12-03 MED ORDER — CLONIDINE HCL 0.1 MG PO TABS
0.2000 mg | ORAL_TABLET | Freq: Once | ORAL | Status: AC
  Administered 2018-12-03: 14:00:00 0.2 mg via ORAL

## 2018-12-03 MED ORDER — CLONIDINE HCL 0.1 MG PO TABS
ORAL_TABLET | ORAL | Status: AC
  Filled 2018-12-03: qty 2

## 2018-12-03 MED ORDER — DEXAMETHASONE SODIUM PHOSPHATE 10 MG/ML IJ SOLN
INTRAMUSCULAR | Status: AC
  Filled 2018-12-03: qty 1

## 2018-12-03 MED ORDER — DEXAMETHASONE SODIUM PHOSPHATE 10 MG/ML IJ SOLN
10.0000 mg | Freq: Once | INTRAMUSCULAR | Status: AC
  Administered 2018-12-03: 10 mg via INTRAVENOUS

## 2018-12-03 MED ORDER — SODIUM CHLORIDE 0.9 % IV SOLN
Freq: Once | INTRAVENOUS | Status: AC
  Administered 2018-12-03: 14:00:00 via INTRAVENOUS
  Filled 2018-12-03: qty 250

## 2018-12-03 NOTE — Progress Notes (Signed)
Per Dr. Julien Nordmann: OK to treat with BP today. Received orders for 0.2mg  Clonidine PO. Orders placed and will recheck BP before pt leaves today.

## 2018-12-03 NOTE — Patient Instructions (Signed)
Coronavirus (COVID-19) Are you at risk?  Are you at risk for the Coronavirus (COVID-19)?  To be considered HIGH RISK for Coronavirus (COVID-19), you have to meet the following criteria:  . Traveled to [place], [place], [place], [place] or [place]; or in the [place] to [place], [place], [place], or [place]; and have fever, cough, and shortness of breath within the last 2 weeks of travel OR . Been in close contact with a person diagnosed with COVID-19 within the last 2 weeks and have fever, cough, and shortness of breath . IF YOU DO NOT MEET THESE CRITERIA, YOU ARE CONSIDERED LOW RISK FOR COVID-19.  What to do if you are HIGH RISK for COVID-19?  . If you are having a medical emergency, call 911. . Seek medical care right away. Before you go to a doctor's office, urgent care or emergency department, call ahead and tell them about your recent travel, contact with someone diagnosed with COVID-19, and your symptoms. You should receive instructions from your physician's office regarding next steps of care.  . When you arrive at healthcare provider, tell the healthcare staff immediately you have returned from visiting [place], [place], [place], [place] or [place]; or traveled in the [place] to [place], [place], [place], or [place]; in the last two weeks or you have been in close contact with a person diagnosed with COVID-19 in the last 2 weeks.   . Tell the health care staff about your symptoms: fever, cough and shortness of breath. . After you have been seen by a medical provider, you will be either: o Tested for (COVID-19) and discharged home on quarantine except to seek medical care if symptoms worsen, and asked to  - Stay home and avoid contact with others until you get your results (4-5 days)  - Avoid travel on public transportation if possible (such as bus, train, or airplane) or o Sent to the Emergency Department by EMS for evaluation, COVID-19 testing, and possible  admission depending on your condition and test results.  What to do if you are LOW RISK for COVID-19?  Reduce your risk of any infection by using the same precautions used for avoiding the common cold or flu:  . Wash your hands often with soap and warm water for at least 20 seconds.  If soap and water are not readily available, use an alcohol-based hand sanitizer with at least 60% alcohol.  . If coughing or sneezing, cover your mouth and nose by coughing or sneezing into the elbow areas of your shirt or coat, into a tissue or into your sleeve (not your hands). . Avoid shaking hands with others and consider head nods or verbal greetings only. . Avoid touching your eyes, nose, or mouth with unwashed hands.  . Avoid close contact with people who are sick. . Avoid places or events with large numbers of people in one location, like concerts or sporting events. . Carefully consider travel plans you have or are making. . If you are planning any travel outside or inside the [place], visit the CDC's Travelers' Health webpage for the latest health notices. . If you have some symptoms but not all symptoms, continue to monitor at home and seek medical attention if your symptoms worsen. . If you are having a medical emergency, call 911.   ADDITIONAL HEALTHCARE OPTIONS FOR PATIENTS  [facility] / e-Visit: [email or web address]         [facility]: [phone]  [facility]: [facility]: [place] Discharge Instructions for Patients Receiving Chemotherapy  Today you received the following chemotherapy agents: Etoposide (Vepesid, VP-16)  To help prevent nausea and vomiting after your treatment, we encourage you to take your nausea medication as directed.    If you develop nausea and vomiting that is not controlled by your nausea medication, call the  clinic.   BELOW ARE SYMPTOMS THAT SHOULD BE REPORTED IMMEDIATELY:  *FEVER GREATER THAN 100.5 F  *CHILLS WITH OR WITHOUT FEVER  NAUSEA AND VOMITING THAT IS NOT CONTROLLED WITH YOUR NAUSEA MEDICATION  *UNUSUAL SHORTNESS OF BREATH  *UNUSUAL BRUISING OR BLEEDING  TENDERNESS IN MOUTH AND THROAT WITH OR WITHOUT PRESENCE OF ULCERS  *URINARY PROBLEMS  *BOWEL PROBLEMS  UNUSUAL RASH Items with * indicate a potential emergency and should be followed up as soon as possible.  Feel free to call the clinic should you have any questions or concerns. The clinic phone number is [phone].  Please show the [facility] at check-in to the Emergency Department and triage nurse.

## 2018-12-03 NOTE — Telephone Encounter (Signed)
Spoke to the patient's significant other, Dominica Severin, to clarify her upcoming appointments. We will continue to monitor her labs closely for her anemia and kidney function. We will see her back in 3 weeks to discuss receiving her last treatment with cycle 6. At that time, we will order a restaging CT scan again. He expressed understanding. All questions answered.

## 2018-12-03 NOTE — Telephone Encounter (Signed)
Received call from Janifer Adie, family/caregiver of patient. He is requesting a call back today from [facility]. She saw pt with Dr. Julien Nordmann yesterday.  He has some questions.  Please call [phone]

## 2018-12-04 ENCOUNTER — Inpatient Hospital Stay: Payer: Medicare Other

## 2018-12-04 ENCOUNTER — Other Ambulatory Visit: Payer: Self-pay

## 2018-12-04 VITALS — BP 159/99 | HR 75 | Temp 98.0°F | Resp 18

## 2018-12-04 DIAGNOSIS — C3431 Malignant neoplasm of lower lobe, right bronchus or lung: Secondary | ICD-10-CM

## 2018-12-04 MED ORDER — SODIUM CHLORIDE 0.9% FLUSH
10.0000 mL | INTRAVENOUS | Status: DC | PRN
  Administered 2018-12-04: 16:00:00 10 mL
  Filled 2018-12-04: qty 10

## 2018-12-04 MED ORDER — SODIUM CHLORIDE 0.9 % IV SOLN
80.0000 mg/m2 | Freq: Once | INTRAVENOUS | Status: AC
  Administered 2018-12-04: 160 mg via INTRAVENOUS
  Filled 2018-12-04: qty 8

## 2018-12-04 MED ORDER — PEGFILGRASTIM 6 MG/0.6ML ~~LOC~~ PSKT
6.0000 mg | PREFILLED_SYRINGE | Freq: Once | SUBCUTANEOUS | Status: AC
  Administered 2018-12-04: 17:00:00 6 mg via SUBCUTANEOUS

## 2018-12-04 MED ORDER — DEXAMETHASONE SODIUM PHOSPHATE 10 MG/ML IJ SOLN
INTRAMUSCULAR | Status: AC
  Filled 2018-12-04: qty 1

## 2018-12-04 MED ORDER — DEXAMETHASONE SODIUM PHOSPHATE 10 MG/ML IJ SOLN
10.0000 mg | Freq: Once | INTRAMUSCULAR | Status: AC
  Administered 2018-12-04: 10 mg via INTRAVENOUS

## 2018-12-04 MED ORDER — HEPARIN SOD (PORK) LOCK FLUSH 100 UNIT/ML IV SOLN
500.0000 [IU] | Freq: Once | INTRAVENOUS | Status: AC | PRN
  Administered 2018-12-04: 500 [IU]
  Filled 2018-12-04: qty 5

## 2018-12-04 MED ORDER — SODIUM CHLORIDE 0.9 % IV SOLN
Freq: Once | INTRAVENOUS | Status: AC
  Administered 2018-12-04: 15:00:00 via INTRAVENOUS
  Filled 2018-12-04: qty 250

## 2018-12-04 MED ORDER — PEGFILGRASTIM 6 MG/0.6ML ~~LOC~~ PSKT
PREFILLED_SYRINGE | SUBCUTANEOUS | Status: AC
  Filled 2018-12-04: qty 0.6

## 2018-12-04 NOTE — Patient Instructions (Signed)
[facility] Discharge Instructions for Patients Receiving Chemotherapy  Today you received the following chemotherapy agents: Etoposide  To help prevent nausea and vomiting after your treatment, we encourage you to take your nausea medication as directed.   If you develop nausea and vomiting that is not controlled by your nausea medication, call the clinic.   BELOW ARE SYMPTOMS THAT SHOULD BE REPORTED IMMEDIATELY:  *FEVER GREATER THAN 100.5 F  *CHILLS WITH OR WITHOUT FEVER  NAUSEA AND VOMITING THAT IS NOT CONTROLLED WITH YOUR NAUSEA MEDICATION  *UNUSUAL SHORTNESS OF BREATH  *UNUSUAL BRUISING OR BLEEDING  TENDERNESS IN MOUTH AND THROAT WITH OR WITHOUT PRESENCE OF ULCERS  *URINARY PROBLEMS  *BOWEL PROBLEMS  UNUSUAL RASH Items with * indicate a potential emergency and should be followed up as soon as possible.  Feel free to call the clinic should you have any questions or concerns. The clinic phone number is [phone].  Please show the [facility] at check-in to the Emergency Department and triage nurse.

## 2018-12-10 ENCOUNTER — Telehealth: Payer: Self-pay | Admitting: Medical Oncology

## 2018-12-10 ENCOUNTER — Other Ambulatory Visit: Payer: Self-pay | Admitting: Medical Oncology

## 2018-12-10 ENCOUNTER — Inpatient Hospital Stay: Payer: Medicare Other

## 2018-12-10 ENCOUNTER — Other Ambulatory Visit: Payer: Self-pay

## 2018-12-10 ENCOUNTER — Telehealth: Payer: Self-pay | Admitting: *Deleted

## 2018-12-10 DIAGNOSIS — N179 Acute kidney failure, unspecified: Secondary | ICD-10-CM

## 2018-12-10 DIAGNOSIS — C3431 Malignant neoplasm of lower lobe, right bronchus or lung: Secondary | ICD-10-CM

## 2018-12-10 DIAGNOSIS — D62 Acute posthemorrhagic anemia: Secondary | ICD-10-CM

## 2018-12-10 DIAGNOSIS — D649 Anemia, unspecified: Secondary | ICD-10-CM

## 2018-12-10 DIAGNOSIS — D696 Thrombocytopenia, unspecified: Secondary | ICD-10-CM

## 2018-12-10 LAB — MAGNESIUM: Magnesium: 1.3 mg/dL — CL (ref 1.7–2.4)

## 2018-12-10 LAB — CBC WITH DIFFERENTIAL (CANCER CENTER ONLY)
Abs Immature Granulocytes: 0.02 10*3/uL (ref 0.00–0.07)
Basophils Absolute: 0 10*3/uL (ref 0.0–0.1)
Basophils Relative: 0 %
Eosinophils Absolute: 0 10*3/uL (ref 0.0–0.5)
Eosinophils Relative: 0 %
HCT: 21.3 % — ABNORMAL LOW (ref 36.0–46.0)
Hemoglobin: 6.7 g/dL — CL (ref 12.0–15.0)
Immature Granulocytes: 1 %
Lymphocytes Relative: 12 %
Lymphs Abs: 0.4 10*3/uL — ABNORMAL LOW (ref 0.7–4.0)
MCH: 31.3 pg (ref 26.0–34.0)
MCHC: 31.5 g/dL (ref 30.0–36.0)
MCV: 99.5 fL (ref 80.0–100.0)
Monocytes Absolute: 0.3 10*3/uL (ref 0.1–1.0)
Monocytes Relative: 10 %
Neutro Abs: 2.7 10*3/uL (ref 1.7–7.7)
Neutrophils Relative %: 77 %
Platelet Count: 42 10*3/uL — ABNORMAL LOW (ref 150–400)
RBC: 2.14 MIL/uL — ABNORMAL LOW (ref 3.87–5.11)
RDW: 22 % — ABNORMAL HIGH (ref 11.5–15.5)
WBC Count: 3.5 10*3/uL — ABNORMAL LOW (ref 4.0–10.5)
nRBC: 0 % (ref 0.0–0.2)

## 2018-12-10 LAB — CMP (CANCER CENTER ONLY)
ALT: 7 U/L (ref 0–44)
AST: 9 U/L — ABNORMAL LOW (ref 15–41)
Albumin: 3.2 g/dL — ABNORMAL LOW (ref 3.5–5.0)
Alkaline Phosphatase: 95 U/L (ref 38–126)
Anion gap: 7 (ref 5–15)
BUN: 20 mg/dL (ref 8–23)
CO2: 27 mmol/L (ref 22–32)
Calcium: 8.5 mg/dL — ABNORMAL LOW (ref 8.9–10.3)
Chloride: 105 mmol/L (ref 98–111)
Creatinine: 1.54 mg/dL — ABNORMAL HIGH (ref 0.44–1.00)
GFR, Est AFR Am: 40 mL/min — ABNORMAL LOW (ref >60)
GFR, Estimated: 35 mL/min — ABNORMAL LOW (ref >60)
Glucose, Bld: 88 mg/dL (ref 70–99)
Potassium: 4.3 mmol/L (ref 3.5–5.1)
Sodium: 139 mmol/L (ref 135–145)
Total Bilirubin: 0.5 mg/dL (ref 0.3–1.2)
Total Protein: 5.6 g/dL — ABNORMAL LOW (ref 6.5–8.1)

## 2018-12-10 LAB — PREPARE RBC (CROSSMATCH)

## 2018-12-10 LAB — SAMPLE TO BLOOD BANK

## 2018-12-10 MED ORDER — SODIUM CHLORIDE 0.9% FLUSH
10.0000 mL | Freq: Once | INTRAVENOUS | Status: AC | PRN
  Administered 2018-12-10: 09:00:00 10 mL
  Filled 2018-12-10: qty 10

## 2018-12-10 MED ORDER — ACETAMINOPHEN 325 MG PO TABS
650.0000 mg | ORAL_TABLET | Freq: Once | ORAL | Status: AC
  Administered 2018-12-10: 13:00:00 650 mg via ORAL

## 2018-12-10 MED ORDER — HEPARIN SOD (PORK) LOCK FLUSH 100 UNIT/ML IV SOLN
500.0000 [IU] | Freq: Once | INTRAVENOUS | Status: AC | PRN
  Administered 2018-12-10: 09:00:00 500 [IU]
  Filled 2018-12-10: qty 5

## 2018-12-10 MED ORDER — SODIUM CHLORIDE 0.9% IV SOLUTION
250.0000 mL | Freq: Once | INTRAVENOUS | Status: AC
  Administered 2018-12-10: 13:00:00 250 mL via INTRAVENOUS
  Filled 2018-12-10: qty 250

## 2018-12-10 MED ORDER — DIPHENHYDRAMINE HCL 25 MG PO CAPS
ORAL_CAPSULE | ORAL | Status: AC
  Filled 2018-12-10: qty 1

## 2018-12-10 MED ORDER — HEPARIN SOD (PORK) LOCK FLUSH 100 UNIT/ML IV SOLN
500.0000 [IU] | Freq: Every day | INTRAVENOUS | Status: DC | PRN
  Filled 2018-12-10: qty 5

## 2018-12-10 MED ORDER — SODIUM CHLORIDE 0.9% FLUSH
10.0000 mL | INTRAVENOUS | Status: DC | PRN
  Filled 2018-12-10: qty 10

## 2018-12-10 MED ORDER — DIPHENHYDRAMINE HCL 25 MG PO CAPS
25.0000 mg | ORAL_CAPSULE | Freq: Once | ORAL | Status: AC
  Administered 2018-12-10: 13:00:00 25 mg via ORAL

## 2018-12-10 MED ORDER — ACETAMINOPHEN 325 MG PO TABS
ORAL_TABLET | ORAL | Status: AC
  Filled 2018-12-10: qty 2

## 2018-12-10 MED ORDER — HEPARIN SOD (PORK) LOCK FLUSH 100 UNIT/ML IV SOLN
500.0000 [IU] | Freq: Every day | INTRAVENOUS | Status: AC | PRN
  Administered 2018-12-10: 16:00:00 500 [IU]
  Filled 2018-12-10: qty 5

## 2018-12-10 MED ORDER — SODIUM CHLORIDE 0.9% FLUSH
10.0000 mL | INTRAVENOUS | Status: AC | PRN
  Administered 2018-12-10: 10 mL
  Filled 2018-12-10: qty 10

## 2018-12-10 NOTE — Telephone Encounter (Signed)
Pt notified to expect a call for blood transfusion today.blood orders entered.

## 2018-12-10 NOTE — Telephone Encounter (Signed)
Received call report from [facility].  "Today's Hgb = 6.7."  Secure Chat message sent with results.  Receipt confirmed.

## 2018-12-10 NOTE — Telephone Encounter (Signed)
Received call report from [place].  "Today's Mg + = 1.3."  Secure message sent with results.

## 2018-12-10 NOTE — Patient Instructions (Signed)

## 2018-12-11 ENCOUNTER — Telehealth: Payer: Self-pay | Admitting: Internal Medicine

## 2018-12-11 LAB — TYPE AND SCREEN
ABO/RH(D): A POS
Antibody Screen: NEGATIVE
Unit division: 0

## 2018-12-11 LAB — BPAM RBC
Blood Product Expiration Date: 202006092359
ISSUE DATE / TIME: 202005261329
Unit Type and Rh: 6200

## 2018-12-11 NOTE — Telephone Encounter (Signed)
Scheduled appt per 5/26 sch message - pt aware of appt date and time

## 2018-12-12 ENCOUNTER — Encounter: Payer: Self-pay | Admitting: Radiation Oncology

## 2018-12-12 ENCOUNTER — Other Ambulatory Visit: Payer: Self-pay

## 2018-12-12 ENCOUNTER — Inpatient Hospital Stay: Payer: Medicare Other

## 2018-12-12 ENCOUNTER — Ambulatory Visit
Admission: RE | Admit: 2018-12-12 | Discharge: 2018-12-12 | Disposition: A | Discharge status: Home / Self Care | Payer: Medicare Other | Source: Ambulatory Visit | Attending: Radiation Oncology | Admitting: Radiation Oncology

## 2018-12-12 VITALS — BP 167/99 | HR 80 | Temp 98.6°F | Resp 20

## 2018-12-12 VITALS — BP 145/80 | HR 83 | Temp 98.7°F | Resp 18 | Ht 65.0 in | Wt 182.5 lb

## 2018-12-12 DIAGNOSIS — R590 Localized enlarged lymph nodes: Secondary | ICD-10-CM | POA: Insufficient documentation

## 2018-12-12 DIAGNOSIS — J439 Emphysema, unspecified: Secondary | ICD-10-CM | POA: Insufficient documentation

## 2018-12-12 DIAGNOSIS — Z9221 Personal history of antineoplastic chemotherapy: Secondary | ICD-10-CM | POA: Diagnosis not present

## 2018-12-12 DIAGNOSIS — Z79899 Other long term (current) drug therapy: Secondary | ICD-10-CM | POA: Insufficient documentation

## 2018-12-12 DIAGNOSIS — I700 Atherosclerosis of aorta: Secondary | ICD-10-CM | POA: Diagnosis not present

## 2018-12-12 DIAGNOSIS — Z923 Personal history of irradiation: Secondary | ICD-10-CM | POA: Diagnosis not present

## 2018-12-12 DIAGNOSIS — R630 Anorexia: Secondary | ICD-10-CM | POA: Diagnosis not present

## 2018-12-12 DIAGNOSIS — N183 Chronic kidney disease, stage 3 (moderate): Secondary | ICD-10-CM

## 2018-12-12 DIAGNOSIS — C3431 Malignant neoplasm of lower lobe, right bronchus or lung: Secondary | ICD-10-CM | POA: Diagnosis not present

## 2018-12-12 DIAGNOSIS — N179 Acute kidney failure, unspecified: Secondary | ICD-10-CM

## 2018-12-12 MED ORDER — SODIUM CHLORIDE 0.9 % IV SOLN
INTRAVENOUS | Status: DC
  Administered 2018-12-12: 09:00:00 via INTRAVENOUS
  Filled 2018-12-12: qty 250

## 2018-12-12 MED ORDER — HEPARIN SOD (PORK) LOCK FLUSH 100 UNIT/ML IV SOLN
500.0000 [IU] | Freq: Once | INTRAVENOUS | Status: AC | PRN
  Administered 2018-12-12: 500 [IU]
  Filled 2018-12-12: qty 5

## 2018-12-12 MED ORDER — SODIUM CHLORIDE 0.9% FLUSH
10.0000 mL | Freq: Once | INTRAVENOUS | Status: AC | PRN
  Administered 2018-12-12: 10 mL
  Filled 2018-12-12: qty 10

## 2018-12-12 MED ORDER — MAGNESIUM SULFATE 4 GM/100ML IV SOLN
4.0000 g | Freq: Once | INTRAVENOUS | Status: AC
  Administered 2018-12-12: 4 g via INTRAVENOUS
  Filled 2018-12-12: qty 100

## 2018-12-12 NOTE — Progress Notes (Signed)
Patient in for one month follow up. She is doing better from a radiation status. She no longer has sore throat and is no longer on Carafate. Denies cough. She continues to have fatigue, She has one more chemo left she gets Mag today. She is not inclined to do WBRT at this time wants to wait to talk about it when she feels better. She wishes to not make an appointment at this time will call. Brandt Loosen RN BP (!) 145/80 (BP Location: Left Arm, Patient Position: Sitting)   Pulse 83   Temp 98.7 F (37.1 C) (Temporal)   Resp 18   Ht 5\' 5"  (1.651 m)   Wt 182 lb 8 oz (82.8 kg)   SpO2 99%   BMI 30.37 kg/m  Wt Readings from Last 3 Encounters:  12/12/18 182 lb 8 oz (82.8 kg)  12/02/18 188 lb 3.2 oz (85.4 kg)  11/12/18 187 lb 12.8 oz (85.2 kg)

## 2018-12-12 NOTE — Patient Instructions (Signed)
Coronavirus (COVID-19) Are you at risk?  Are you at risk for the Coronavirus (COVID-19)?  To be considered HIGH RISK for Coronavirus (COVID-19), you have to meet the following criteria:  . Traveled to [place], [place], [place], [place] or [place]; or in the [place] to [place], [place], [place], or [place]; and have fever, cough, and shortness of breath within the last 2 weeks of travel OR . Been in close contact with a person diagnosed with COVID-19 within the last 2 weeks and have fever, cough, and shortness of breath . IF YOU DO NOT MEET THESE CRITERIA, YOU ARE CONSIDERED LOW RISK FOR COVID-19.  What to do if you are HIGH RISK for COVID-19?  . If you are having a medical emergency, call 911. . Seek medical care right away. Before you go to a doctor's office, urgent care or emergency department, call ahead and tell them about your recent travel, contact with someone diagnosed with COVID-19, and your symptoms. You should receive instructions from your physician's office regarding next steps of care.  . When you arrive at healthcare provider, tell the healthcare staff immediately you have returned from visiting [place], [place], [place], [place] or [place]; or traveled in the [place] to [place], [place], [place], or [place]; in the last two weeks or you have been in close contact with a person diagnosed with COVID-19 in the last 2 weeks.   . Tell the health care staff about your symptoms: fever, cough and shortness of breath. . After you have been seen by a medical provider, you will be either: o Tested for (COVID-19) and discharged home on quarantine except to seek medical care if symptoms worsen, and asked to  - Stay home and avoid contact with others until you get your results (4-5 days)  - Avoid travel on public transportation if possible (such as bus, train, or airplane) or o Sent to the Emergency Department by EMS for evaluation, COVID-19 testing, and possible  admission depending on your condition and test results.  What to do if you are LOW RISK for COVID-19?  Reduce your risk of any infection by using the same precautions used for avoiding the common cold or flu:  . Wash your hands often with soap and warm water for at least 20 seconds.  If soap and water are not readily available, use an alcohol-based hand sanitizer with at least 60% alcohol.  . If coughing or sneezing, cover your mouth and nose by coughing or sneezing into the elbow areas of your shirt or coat, into a tissue or into your sleeve (not your hands). . Avoid shaking hands with others and consider head nods or verbal greetings only. . Avoid touching your eyes, nose, or mouth with unwashed hands.  . Avoid close contact with people who are sick. . Avoid places or events with large numbers of people in one location, like concerts or sporting events. . Carefully consider travel plans you have or are making. . If you are planning any travel outside or inside the [place], visit the CDC's Travelers' Health webpage for the latest health notices. . If you have some symptoms but not all symptoms, continue to monitor at home and seek medical attention if your symptoms worsen. . If you are having a medical emergency, call 911.   ADDITIONAL HEALTHCARE OPTIONS FOR PATIENTS  [facility] / e-Visit: [email or web address]         [facility]: [phone]     Urgent Care: [phone]                   [facility]: [phone]   

## 2018-12-12 NOTE — Patient Instructions (Signed)
Magnesium Sulfate injection What is this medicine? MAGNESIUM SULFATE (mag NEE zee um SUL fate) is an electrolyte injection commonly used to treat low magnesium levels in your blood. It is also used to prevent or control seizures in women with preeclampsia or eclampsia. This medicine may be used for other purposes; ask your health care provider or pharmacist if you have questions. What should I tell my health care provider before I take this medicine? They need to know if you have any of these conditions: -heart disease -history of irregular heart beat -kidney disease -an unusual or allergic reaction to magnesium sulfate, medicines, foods, dyes, or preservatives -pregnant or trying to get pregnant -breast-feeding How should I use this medicine? This medicine is for infusion into a vein. It is given by a health care professional in a hospital or clinic setting. Talk to your pediatrician regarding the use of this medicine in children. While this drug may be prescribed for selected conditions, precautions do apply. Overdosage: If you think you have taken too much of this medicine contact a poison control center or emergency room at once. NOTE: This medicine is only for you. Do not share this medicine with others. What if I miss a dose? This does not apply. What may interact with this medicine? This medicine may interact with the following medications: -certain medicines for anxiety or sleep -certain medicines for seizures like phenobarbital -digoxin -medicines that relax muscles for surgery -narcotic medicines for pain This list may not describe all possible interactions. Give your health care provider a list of all the medicines, herbs, non-prescription drugs, or dietary supplements you use. Also tell them if you smoke, drink alcohol, or use illegal drugs. Some items may interact with your medicine. What should I watch for while using this medicine? Your condition will be monitored carefully  while you are receiving this medicine. You may need blood work done while you are receiving this medicine. What side effects may I notice from receiving this medicine? Side effects that you should report to your doctor or health care professional as soon as possible: -allergic reactions like skin rash, itching or hives, swelling of the face, lips, or tongue -facial flushing -muscle weakness -signs and symptoms of low blood pressure like dizziness; feeling faint or lightheaded, falls; unusually weak or tired -signs and symptoms of a dangerous change in heartbeat or heart rhythm like chest pain; dizziness; fast or irregular heartbeat; palpitations; breathing problems -sweating This list may not describe all possible side effects. Call your doctor for medical advice about side effects. You may report side effects to FDA at 1-800-FDA-1088. Where should I keep my medicine? This drug is given in a hospital or clinic and will not be stored at home. NOTE: This sheet is a summary. It may not cover all possible information. If you have questions about this medicine, talk to your doctor, pharmacist, or health care provider.  2019 Elsevier/Gold Standard (2016-01-19 12:31:42)  Coronavirus (COVID-19) Are you at risk?  Are you at risk for the Coronavirus (COVID-19)?  To be considered HIGH RISK for Coronavirus (COVID-19), you have to meet the following criteria:  . Traveled to [place], [place], [place], [place] or [place]; or in the [place] to [place], [place], [place], or [place]; and have fever, cough, and shortness of breath within the last 2 weeks of travel OR . Been in close contact with a person diagnosed with COVID-19 within the last 2 weeks and have fever, cough, and shortness of breath .  IF YOU DO NOT MEET THESE CRITERIA, YOU ARE CONSIDERED LOW RISK FOR COVID-19.  What to do if you are HIGH RISK for COVID-19?  Marland Kitchen If you are having a medical emergency, call 911. . Seek medical care  right away. Before you go to a doctor's office, urgent care or emergency department, call ahead and tell them about your recent travel, contact with someone diagnosed with COVID-19, and your symptoms. You should receive instructions from your physician's office regarding next steps of care.  . When you arrive at healthcare provider, tell the healthcare staff immediately you have returned from visiting [place], [place], [place], [place] or [place]; or traveled in the [place] to [place], [place], [place], or [place]; in the last two weeks or you have been in close contact with a person diagnosed with COVID-19 in the last 2 weeks.   . Tell the health care staff about your symptoms: fever, cough and shortness of breath. . After you have been seen by a medical provider, you will be either: o Tested for (COVID-19) and discharged home on quarantine except to seek medical care if symptoms worsen, and asked to  - Stay home and avoid contact with others until you get your results (4-5 days)  - Avoid travel on public transportation if possible (such as bus, train, or airplane) or o Sent to the Emergency Department by EMS for evaluation, COVID-19 testing, and possible admission depending on your condition and test results.  What to do if you are LOW RISK for COVID-19?  Reduce your risk of any infection by using the same precautions used for avoiding the common cold or flu:  Marland Kitchen Wash your hands often with soap and warm water for at least 20 seconds.  If soap and water are not readily available, use an alcohol-based hand sanitizer with at least 60% alcohol.  . If coughing or sneezing, cover your mouth and nose by coughing or sneezing into the elbow areas of your shirt or coat, into a tissue or into your sleeve (not your hands). . Avoid shaking hands with others and consider head nods or verbal greetings only. . Avoid touching your eyes, nose, or mouth with unwashed hands.  . Avoid close contact with  people who are sick. . Avoid places or events with large numbers of people in one location, like concerts or sporting events. . Carefully consider travel plans you have or are making. . If you are planning any travel outside or inside the [place], visit the CDC's Travelers' Health webpage for the latest health notices. . If you have some symptoms but not all symptoms, continue to monitor at home and seek medical attention if your symptoms worsen. . If you are having a medical emergency, call 911.   [facility] / e-Visit: [email or web address]         [facility]: [facility]: [phone]                   [facility]: [phone]

## 2018-12-12 NOTE — Progress Notes (Signed)
Radiation Oncology         [phone] ________________________________  Name: [name] MRN: [id number]  Date: 12/12/2018  DOB: 02/17/1951  Follow-Up Visit Note  CC: Rutherford Guys, MD  Curt Bears, MD    ICD-10-CM   1. Small cell carcinoma of lower lobe of right lung (HCC) C34.31     Diagnosis:   68 y.o. female with Stage T2a N2 M0 Small Cell Carcinoma of the RLL   Interval Since Last Radiation:  5 weeks  Radiation treatment dates:   09/25/2018 - 11/06/2018 Site/dose:   Right Lung / 60 Gy in 30 fractions  Narrative:  The patient returns today for routine follow-up.  Since completing radiation therapy, she underwent a CT scan of the chest for restaging on 11/21/2018 which showed the anterior right lower lobe lung mass slightly decreased. Right hilar and mediastinal lymphadenopathy is stable to slightly decreased. No evidence of new or progressive metastatic disease in the chest.   She has one more chemotherapy infusion left to complete her planned course of therapy. CT scans have shown continued improvement with no new problems.   On review of systems, the patient is doing better from a radiation status. She doesn't have any esophageal problems at this time. She no longer has sore throat and is no longer on Carafate. She denies cough. She continues to have fatigue. Her breathing is good. Her main complaint now is poor appetite.  ALLERGIES:  has No Known Allergies.  Meds: Current Outpatient Medications  Medication Sig Dispense Refill   acetaminophen (TYLENOL) 650 MG CR tablet Take 650 mg by mouth every 8 (eight) hours as needed for pain.     atorvastatin (LIPITOR) 80 MG tablet Take 1 tablet (80 mg total) by mouth daily at 6 PM. 90 tablet 3   butalbital-acetaminophen-caffeine (FIORICET) 50-325-40 MG tablet Take 1-2 tablets by mouth every 6 (six) hours as needed for headache. 20 tablet 0   feeding supplement, ENSURE ENLIVE, (ENSURE ENLIVE) LIQD Take 237 mLs by  mouth 2 (two) times daily between meals. 60 Bottle 0   ferrous sulfate 325 (65 FE) MG tablet Take 325 mg by mouth daily with breakfast.     lisinopril (ZESTRIL) 40 MG tablet TAKE 1 TABLET BY MOUTH EVERY DAY 90 tablet 0   magnesium oxide (MAG-OX) 400 MG tablet TAKE 1 TABLET BY MOUTH 3 TIMES A DAY (Patient taking differently: Take 400 mg by mouth 4 (four) times daily. ) 90 tablet 0   ondansetron (ZOFRAN) 4 MG tablet Take 1 tablet (4 mg total) by mouth every 8 (eight) hours as needed for nausea or vomiting (give 2nd). 30 tablet 0   pantoprazole (PROTONIX) 40 MG tablet TAKE 1 TABLET (40 MG TOTAL) BY MOUTH 2 (TWO) TIMES DAILY BEFORE A MEAL. 180 tablet 1   prochlorperazine (COMPAZINE) 10 MG tablet Take 1 tablet (10 mg total) by mouth every 6 (six) hours as needed for nausea or vomiting. 30 tablet 1   lidocaine-prilocaine (EMLA) cream Apply 1 application topically as needed. (Patient not taking: Reported on 12/12/2018) 30 g 0   oxyCODONE-acetaminophen (PERCOCET/ROXICET) 5-325 MG tablet Take 1 tablet by mouth every 8 (eight) hours as needed for severe pain. (Patient not taking: Reported on 12/12/2018) 15 tablet 0   potassium chloride SA (K-DUR) 20 MEQ tablet Take 1 tablet (20 mEq total) by mouth daily. 5 tablet 0   No current facility-administered medications for this encounter.     Physical Findings: The patient is in no  acute distress. Patient is alert and oriented.  height is 5\' 5"  (1.651 m) and weight is 182 lb 8 oz (82.8 kg). Her temporal temperature is 98.7 F (37.1 C). Her blood pressure is 145/80 (abnormal) and her pulse is 83. Her respiration is 18 and oxygen saturation is 99%.   Lungs are clear to auscultation bilaterally. Heart has regular rate and rhythm. No palpable cervical, supraclavicular, or axillary adenopathy. Abdomen soft, non-tender, normal bowel sounds.  Lab Findings: Lab Results  Component Value Date   WBC 3.5 (L) 12/10/2018   HGB 6.7 (LL) 12/10/2018   HCT 21.3 (L)  12/10/2018   MCV 99.5 12/10/2018   PLT 42 (L) 12/10/2018    Radiographic Findings: Ct Chest Wo Contrast  Result Date: 11/21/2018 CLINICAL DATA:  Ongoing chemotherapy for right lower lobe small cell lung carcinoma, status post radiation therapy. Restaging. EXAM: CT CHEST WITHOUT CONTRAST TECHNIQUE: Multidetector CT imaging of the chest was performed following the standard protocol without IV contrast. COMPARISON:  10/11/2018 chest CT. FINDINGS: Cardiovascular: Normal heart size. No significant pericardial effusion/thickening. Three-vessel coronary atherosclerosis. Right internal jugular Port-A-Cath terminates at the cavoatrial junction. Atherosclerotic nonaneurysmal thoracic aorta. Normal caliber pulmonary arteries. Mediastinum/Nodes: No discrete thyroid nodules. Unremarkable esophagus. No axillary adenopathy. Enlarged 3.3 cm right paratracheal node (series 2/image 50), previously 3.3 cm, stable. Mildly enlarged 1.6 cm subcarinal node (series 2/image 62), slightly decreased from 1.8 cm. No new pathologically enlarged mediastinal nodes. Mildly enlarged 1.3 cm right infrahilar node (series 2/image 76), previously 1.5 cm, slightly decreased. No discrete left hilar adenopathy on this noncontrast scan. Lungs/Pleura: No pneumothorax. No pleural effusion. Anterior right lower lobe 3.0 x 3.0 cm solid lung mass (series 5/image 84), previously 3.3 x 3.1 cm, minimally decreased. A few scattered tiny solid pulmonary nodules in both lungs largest 4 mm in the subpleural left lower lobe (series 5/image 83), all stable. No acute consolidative airspace disease or new significant pulmonary nodules. Mild centrilobular emphysema with diffuse bronchial wall thickening. Upper abdomen: Stable left adrenal nodules with indeterminate densities, largest 1.3 cm with density 22 HU, not hypermetabolic on prior PET-CT, probably benign. Musculoskeletal: No aggressive appearing focal osseous lesions. Stable posterior element sclerosis on the  right at T5-6, non hypermetabolic on prior PET-CT, presumably degenerative. Moderate thoracic spondylosis. IMPRESSION: 1. Anterior right lower lobe lung mass is slightly decreased. 2. Right hilar and mediastinal lymphadenopathy is stable to slightly decreased. 3. No evidence of new or progressive metastatic disease in the chest. 4. Stable left adrenal nodules with indeterminate density, previously non hypermetabolic, probably benign. Aortic Atherosclerosis (ICD10-I70.0) and Emphysema (ICD10-J43.9). Electronically Signed   By: Ilona Sorrel M.D.   On: 11/21/2018 17:07    Impression:  Stage T2a N2 M0 Small Cell Carcinoma of the RLL. The patient is recovering from the effects of radiation.  She is having a good response to her chemotherapy and chest radiation treatment. She will proceed with one more cycle of chemotherapy then re-scanning.   I talked with the patient today for consideration of prophylactic cranial irradiation (PCI). We discussed the course of treatment as well as side effects and potential toxicities of whole brain radiotherapy. At this point, she does not want to commit to PCI. She is "too fatigued at this point to make a reasonable decision", in her words.  Plan:  Patient will continue with one more cycle of chemotherapy. She will call to let us know if she wishes to proceed with PCI. If she decides against PCI, it would be reasonable to  repeat brain MRI in the next few months to rule out asymptomatic brain metastasis.  ____________________________________  Blair Promise, PhD, MD  This document serves as a record of services personally performed by Gery Pray, MD. It was created on his behalf by Rae Lips, a trained medical scribe. The creation of this record is based on the scribe's personal observations and the provider's statements to them. This document has been checked and approved by the attending provider.

## 2018-12-17 ENCOUNTER — Other Ambulatory Visit: Payer: Self-pay

## 2018-12-17 ENCOUNTER — Inpatient Hospital Stay: Payer: Medicare Other | Attending: Internal Medicine

## 2018-12-17 ENCOUNTER — Inpatient Hospital Stay: Payer: Medicare Other

## 2018-12-17 DIAGNOSIS — Z9221 Personal history of antineoplastic chemotherapy: Secondary | ICD-10-CM | POA: Insufficient documentation

## 2018-12-17 DIAGNOSIS — N179 Acute kidney failure, unspecified: Secondary | ICD-10-CM

## 2018-12-17 DIAGNOSIS — R1310 Dysphagia, unspecified: Secondary | ICD-10-CM | POA: Insufficient documentation

## 2018-12-17 DIAGNOSIS — R197 Diarrhea, unspecified: Secondary | ICD-10-CM | POA: Diagnosis not present

## 2018-12-17 DIAGNOSIS — Z8719 Personal history of other diseases of the digestive system: Secondary | ICD-10-CM | POA: Diagnosis not present

## 2018-12-17 DIAGNOSIS — Z923 Personal history of irradiation: Secondary | ICD-10-CM | POA: Diagnosis not present

## 2018-12-17 DIAGNOSIS — I10 Essential (primary) hypertension: Secondary | ICD-10-CM | POA: Diagnosis not present

## 2018-12-17 DIAGNOSIS — Y842 Radiological procedure and radiotherapy as the cause of abnormal reaction of the patient, or of later complication, without mention of misadventure at the time of the procedure: Secondary | ICD-10-CM | POA: Insufficient documentation

## 2018-12-17 DIAGNOSIS — R634 Abnormal weight loss: Secondary | ICD-10-CM | POA: Diagnosis not present

## 2018-12-17 DIAGNOSIS — M1990 Unspecified osteoarthritis, unspecified site: Secondary | ICD-10-CM | POA: Diagnosis not present

## 2018-12-17 DIAGNOSIS — N289 Disorder of kidney and ureter, unspecified: Secondary | ICD-10-CM | POA: Insufficient documentation

## 2018-12-17 DIAGNOSIS — R5383 Other fatigue: Secondary | ICD-10-CM | POA: Insufficient documentation

## 2018-12-17 DIAGNOSIS — Z8673 Personal history of transient ischemic attack (TIA), and cerebral infarction without residual deficits: Secondary | ICD-10-CM | POA: Diagnosis not present

## 2018-12-17 DIAGNOSIS — Z79899 Other long term (current) drug therapy: Secondary | ICD-10-CM | POA: Diagnosis not present

## 2018-12-17 DIAGNOSIS — D61811 Other drug-induced pancytopenia: Secondary | ICD-10-CM | POA: Insufficient documentation

## 2018-12-17 DIAGNOSIS — C3431 Malignant neoplasm of lower lobe, right bronchus or lung: Secondary | ICD-10-CM | POA: Diagnosis present

## 2018-12-17 DIAGNOSIS — N183 Chronic kidney disease, stage 3 (moderate): Secondary | ICD-10-CM

## 2018-12-17 DIAGNOSIS — T451X5S Adverse effect of antineoplastic and immunosuppressive drugs, sequela: Secondary | ICD-10-CM | POA: Diagnosis not present

## 2018-12-17 LAB — CBC WITH DIFFERENTIAL (CANCER CENTER ONLY)
Abs Immature Granulocytes: 0.23 10*3/uL — ABNORMAL HIGH (ref 0.00–0.07)
Basophils Absolute: 0 10*3/uL (ref 0.0–0.1)
Basophils Relative: 0 %
Eosinophils Absolute: 0 10*3/uL (ref 0.0–0.5)
Eosinophils Relative: 0 %
HCT: 22.9 % — ABNORMAL LOW (ref 36.0–46.0)
Hemoglobin: 7.4 g/dL — ABNORMAL LOW (ref 12.0–15.0)
Immature Granulocytes: 4 %
Lymphocytes Relative: 9 %
Lymphs Abs: 0.5 10*3/uL — ABNORMAL LOW (ref 0.7–4.0)
MCH: 31.6 pg (ref 26.0–34.0)
MCHC: 32.3 g/dL (ref 30.0–36.0)
MCV: 97.9 fL (ref 80.0–100.0)
Monocytes Absolute: 0.5 10*3/uL (ref 0.1–1.0)
Monocytes Relative: 10 %
Neutro Abs: 4.1 10*3/uL (ref 1.7–7.7)
Neutrophils Relative %: 77 %
Platelet Count: 46 10*3/uL — ABNORMAL LOW (ref 150–400)
RBC: 2.34 MIL/uL — ABNORMAL LOW (ref 3.87–5.11)
RDW: 21.8 % — ABNORMAL HIGH (ref 11.5–15.5)
WBC Count: 5.4 10*3/uL (ref 4.0–10.5)
nRBC: 0 % (ref 0.0–0.2)

## 2018-12-17 LAB — CMP (CANCER CENTER ONLY)
ALT: 8 U/L (ref 0–44)
AST: 11 U/L — ABNORMAL LOW (ref 15–41)
Albumin: 3.4 g/dL — ABNORMAL LOW (ref 3.5–5.0)
Alkaline Phosphatase: 106 U/L (ref 38–126)
Anion gap: 8 (ref 5–15)
BUN: 12 mg/dL (ref 8–23)
CO2: 25 mmol/L (ref 22–32)
Calcium: 8.5 mg/dL — ABNORMAL LOW (ref 8.9–10.3)
Chloride: 108 mmol/L (ref 98–111)
Creatinine: 1.46 mg/dL — ABNORMAL HIGH (ref 0.44–1.00)
GFR, Est AFR Am: 43 mL/min — ABNORMAL LOW (ref >60)
GFR, Estimated: 37 mL/min — ABNORMAL LOW (ref >60)
Glucose, Bld: 87 mg/dL (ref 70–99)
Potassium: 4.1 mmol/L (ref 3.5–5.1)
Sodium: 141 mmol/L (ref 135–145)
Total Bilirubin: <0.2 mg/dL — ABNORMAL LOW (ref 0.3–1.2)
Total Protein: 5.6 g/dL — ABNORMAL LOW (ref 6.5–8.1)

## 2018-12-17 LAB — MAGNESIUM: Magnesium: 1.6 mg/dL — ABNORMAL LOW (ref 1.7–2.4)

## 2018-12-17 MED ORDER — SODIUM CHLORIDE 0.9% FLUSH
10.0000 mL | Freq: Once | INTRAVENOUS | Status: AC | PRN
  Administered 2018-12-17: 10 mL
  Filled 2018-12-17: qty 10

## 2018-12-17 MED ORDER — HEPARIN SOD (PORK) LOCK FLUSH 100 UNIT/ML IV SOLN
500.0000 [IU] | Freq: Once | INTRAVENOUS | Status: AC | PRN
  Administered 2018-12-17: 500 [IU]
  Filled 2018-12-17: qty 5

## 2018-12-17 NOTE — Patient Instructions (Signed)

## 2018-12-24 ENCOUNTER — Inpatient Hospital Stay: Payer: Medicare Other

## 2018-12-24 ENCOUNTER — Other Ambulatory Visit: Payer: Self-pay

## 2018-12-24 ENCOUNTER — Inpatient Hospital Stay (HOSPITAL_BASED_OUTPATIENT_CLINIC_OR_DEPARTMENT_OTHER): Payer: Medicare Other | Admitting: Physician Assistant

## 2018-12-24 ENCOUNTER — Encounter: Payer: Self-pay | Admitting: Physician Assistant

## 2018-12-24 VITALS — BP 157/86 | HR 84 | Temp 98.7°F | Resp 18 | Ht 65.0 in | Wt 183.1 lb

## 2018-12-24 DIAGNOSIS — R197 Diarrhea, unspecified: Secondary | ICD-10-CM

## 2018-12-24 DIAGNOSIS — C3431 Malignant neoplasm of lower lobe, right bronchus or lung: Secondary | ICD-10-CM

## 2018-12-24 DIAGNOSIS — D61811 Other drug-induced pancytopenia: Secondary | ICD-10-CM | POA: Diagnosis not present

## 2018-12-24 DIAGNOSIS — N183 Chronic kidney disease, stage 3 (moderate): Secondary | ICD-10-CM

## 2018-12-24 DIAGNOSIS — Z8673 Personal history of transient ischemic attack (TIA), and cerebral infarction without residual deficits: Secondary | ICD-10-CM

## 2018-12-24 DIAGNOSIS — Y842 Radiological procedure and radiotherapy as the cause of abnormal reaction of the patient, or of later complication, without mention of misadventure at the time of the procedure: Secondary | ICD-10-CM

## 2018-12-24 DIAGNOSIS — R634 Abnormal weight loss: Secondary | ICD-10-CM

## 2018-12-24 DIAGNOSIS — N179 Acute kidney failure, unspecified: Secondary | ICD-10-CM

## 2018-12-24 DIAGNOSIS — Z9221 Personal history of antineoplastic chemotherapy: Secondary | ICD-10-CM | POA: Diagnosis not present

## 2018-12-24 DIAGNOSIS — T451X5S Adverse effect of antineoplastic and immunosuppressive drugs, sequela: Secondary | ICD-10-CM

## 2018-12-24 DIAGNOSIS — Z7189 Other specified counseling: Secondary | ICD-10-CM

## 2018-12-24 DIAGNOSIS — R1310 Dysphagia, unspecified: Secondary | ICD-10-CM

## 2018-12-24 DIAGNOSIS — I10 Essential (primary) hypertension: Secondary | ICD-10-CM

## 2018-12-24 DIAGNOSIS — Z923 Personal history of irradiation: Secondary | ICD-10-CM

## 2018-12-24 DIAGNOSIS — D649 Anemia, unspecified: Secondary | ICD-10-CM

## 2018-12-24 DIAGNOSIS — Z8719 Personal history of other diseases of the digestive system: Secondary | ICD-10-CM

## 2018-12-24 DIAGNOSIS — M1990 Unspecified osteoarthritis, unspecified site: Secondary | ICD-10-CM

## 2018-12-24 DIAGNOSIS — Z79899 Other long term (current) drug therapy: Secondary | ICD-10-CM

## 2018-12-24 DIAGNOSIS — N289 Disorder of kidney and ureter, unspecified: Secondary | ICD-10-CM

## 2018-12-24 DIAGNOSIS — R5383 Other fatigue: Secondary | ICD-10-CM

## 2018-12-24 LAB — CBC WITH DIFFERENTIAL (CANCER CENTER ONLY)
Abs Immature Granulocytes: 0.02 10*3/uL (ref 0.00–0.07)
Basophils Absolute: 0 10*3/uL (ref 0.0–0.1)
Basophils Relative: 0 %
Eosinophils Absolute: 0 10*3/uL (ref 0.0–0.5)
Eosinophils Relative: 0 %
HCT: 22.1 % — ABNORMAL LOW (ref 36.0–46.0)
Hemoglobin: 7 g/dL — ABNORMAL LOW (ref 12.0–15.0)
Immature Granulocytes: 0 %
Lymphocytes Relative: 8 %
Lymphs Abs: 0.4 10*3/uL — ABNORMAL LOW (ref 0.7–4.0)
MCH: 32.4 pg (ref 26.0–34.0)
MCHC: 31.7 g/dL (ref 30.0–36.0)
MCV: 102.3 fL — ABNORMAL HIGH (ref 80.0–100.0)
Monocytes Absolute: 0.6 10*3/uL (ref 0.1–1.0)
Monocytes Relative: 13 %
Neutro Abs: 3.6 10*3/uL (ref 1.7–7.7)
Neutrophils Relative %: 79 %
Platelet Count: 134 10*3/uL — ABNORMAL LOW (ref 150–400)
RBC: 2.16 MIL/uL — ABNORMAL LOW (ref 3.87–5.11)
RDW: 23 % — ABNORMAL HIGH (ref 11.5–15.5)
WBC Count: 4.6 10*3/uL (ref 4.0–10.5)
nRBC: 0 % (ref 0.0–0.2)

## 2018-12-24 LAB — CMP (CANCER CENTER ONLY)
ALT: <6 U/L (ref 0–44)
AST: 12 U/L — ABNORMAL LOW (ref 15–41)
Albumin: 3.3 g/dL — ABNORMAL LOW (ref 3.5–5.0)
Alkaline Phosphatase: 93 U/L (ref 38–126)
Anion gap: 9 (ref 5–15)
BUN: 15 mg/dL (ref 8–23)
CO2: 24 mmol/L (ref 22–32)
Calcium: 8.5 mg/dL — ABNORMAL LOW (ref 8.9–10.3)
Chloride: 107 mmol/L (ref 98–111)
Creatinine: 1.8 mg/dL — ABNORMAL HIGH (ref 0.44–1.00)
GFR, Est AFR Am: 33 mL/min — ABNORMAL LOW (ref >60)
GFR, Estimated: 29 mL/min — ABNORMAL LOW (ref >60)
Glucose, Bld: 97 mg/dL (ref 70–99)
Potassium: 4.3 mmol/L (ref 3.5–5.1)
Sodium: 140 mmol/L (ref 135–145)
Total Bilirubin: 0.3 mg/dL (ref 0.3–1.2)
Total Protein: 5.6 g/dL — ABNORMAL LOW (ref 6.5–8.1)

## 2018-12-24 LAB — MAGNESIUM: Magnesium: 1.9 mg/dL (ref 1.7–2.4)

## 2018-12-24 LAB — PREPARE RBC (CROSSMATCH)

## 2018-12-24 MED ORDER — SODIUM CHLORIDE 0.9% FLUSH
10.0000 mL | Freq: Once | INTRAVENOUS | Status: AC | PRN
  Administered 2018-12-24: 10 mL
  Filled 2018-12-24: qty 10

## 2018-12-24 MED ORDER — DIPHENHYDRAMINE HCL 25 MG PO CAPS
ORAL_CAPSULE | ORAL | Status: AC
  Filled 2018-12-24: qty 2

## 2018-12-24 MED ORDER — HEPARIN SOD (PORK) LOCK FLUSH 100 UNIT/ML IV SOLN
500.0000 [IU] | Freq: Every day | INTRAVENOUS | Status: AC | PRN
  Administered 2018-12-24: 500 [IU]
  Filled 2018-12-24: qty 5

## 2018-12-24 MED ORDER — SODIUM CHLORIDE 0.9 % IV SOLN
Freq: Once | INTRAVENOUS | Status: AC
  Administered 2018-12-24: 10:00:00 via INTRAVENOUS
  Filled 2018-12-24: qty 250

## 2018-12-24 MED ORDER — SODIUM CHLORIDE 0.9% IV SOLUTION
250.0000 mL | Freq: Once | INTRAVENOUS | Status: AC
  Administered 2018-12-24: 250 mL via INTRAVENOUS
  Filled 2018-12-24: qty 250

## 2018-12-24 MED ORDER — ACETAMINOPHEN 325 MG PO TABS
ORAL_TABLET | ORAL | Status: AC
  Filled 2018-12-24: qty 2

## 2018-12-24 MED ORDER — SODIUM CHLORIDE 0.9% FLUSH
10.0000 mL | INTRAVENOUS | Status: AC | PRN
  Administered 2018-12-24: 10 mL
  Filled 2018-12-24: qty 10

## 2018-12-24 MED ORDER — DIPHENHYDRAMINE HCL 25 MG PO CAPS
25.0000 mg | ORAL_CAPSULE | Freq: Once | ORAL | Status: AC
  Administered 2018-12-24: 11:00:00 25 mg via ORAL

## 2018-12-24 MED ORDER — ACETAMINOPHEN 325 MG PO TABS
650.0000 mg | ORAL_TABLET | Freq: Once | ORAL | Status: AC
  Administered 2018-12-24: 650 mg via ORAL

## 2018-12-24 NOTE — Patient Instructions (Signed)
Blood Transfusion, Adult, Care After This sheet gives you information about how to care for yourself after your procedure. Your doctor may also give you more specific instructions. If you have problems or questions, contact your doctor. Follow these instructions at home:   Take over-the-counter and prescription medicines only as told by your doctor.  Go back to your normal activities as told by your doctor.  Follow instructions from your doctor about how to take care of the area where an IV tube was put into your vein (insertion site). Make sure you: ? Wash your hands with soap and water before you change your bandage (dressing). If there is no soap and water, use hand sanitizer. ? Change your bandage as told by your doctor.  Check your IV insertion site every day for signs of infection. Check for: ? More redness, swelling, or pain. ? More fluid or blood. ? Warmth. ? Pus or a bad smell. Contact a doctor if:  You have more redness, swelling, or pain around the IV insertion site.  You have more fluid or blood coming from the IV insertion site.  Your IV insertion site feels warm to the touch.  You have pus or a bad smell coming from the IV insertion site.  Your pee (urine) turns pink, red, or brown.  You feel weak after doing your normal activities. Get help right away if:  You have signs of a serious allergic or body defense (immune) system reaction, including: ? Itchiness. ? Hives. ? Trouble breathing. ? Anxiety. ? Pain in your chest or lower back. ? Fever, flushing, and chills. ? Fast pulse. ? Rash. ? Watery poop (diarrhea). ? Throwing up (vomiting). ? Dark pee. ? Serious headache. ? Dizziness. ? Stiff neck. ? Yellow color in your face or the white parts of your eyes (jaundice). Summary  After a blood transfusion, return to your normal activities as told by your doctor.  Every day, check for signs of infection where the IV tube was put into your vein.  Some  signs of infection are warm skin, more redness and pain, more fluid or blood, and pus or a bad smell where the needle went in.  Contact your doctor if you feel weak or have any unusual symptoms. This information is not intended to replace advice given to you by your health care provider. Make sure you discuss any questions you have with your health care provider. Document Released: 07/24/2014 Document Revised: 02/25/2016 Document Reviewed: 02/25/2016 Elsevier Interactive Patient Education  2019 [employer] Rehydration, Adult Rehydration is the replacement of body fluids and salts and minerals (electrolytes) that are lost during dehydration. Dehydration is when there is not enough fluid or water in the body. This happens when you lose more fluids than you take in. Common causes of dehydration include:  Vomiting.  Diarrhea.  Excessive sweating, such as from heat exposure or exercise.  Taking medicines that cause the body to lose excess fluid (diuretics).  Impaired kidney function.  Not drinking enough fluid.  Certain illnesses or infections.  Certain poorly controlled long-term (chronic) illnesses, such as diabetes, heart disease, and kidney disease.  Symptoms of mild dehydration may include thirst, dry lips and mouth, dry skin, and dizziness. Symptoms of severe dehydration may include increased heart rate, confusion, fainting, and not urinating. You can rehydrate by drinking certain fluids or getting fluids through an IV tube, as told by your health care provider. What are the risks? Generally, rehydration is safe. However, one problem that can  happen is taking in too much fluid (overhydration). This is rare. If overhydration happens, it can cause an electrolyte imbalance, kidney failure, or a decrease in salt (sodium) levels in the body. How to rehydrate Follow instructions from your health care provider for rehydration. The kind of fluid you should drink and the amount you should  drink depend on your condition.  If directed by your health care provider, drink an oral rehydration solution (ORS). This is a drink designed to treat dehydration that is found in pharmacies and retail stores. ? Make an ORS by following instructions on the package. ? Start by drinking small amounts, about  cup (120 mL) every 5-10 minutes. ? Slowly increase how much you drink until you have taken the amount recommended by your health care provider.  Drink enough clear fluids to keep your urine clear or pale yellow. If you were instructed to drink an ORS, finish the ORS first, then start slowly drinking other clear fluids. Drink fluids such as: ? Water. Do not drink only water. Doing that can lead to having too little sodium in your body (hyponatremia). ? Ice chips. ? Fruit juice that you have added water to (diluted juice). ? Low-calorie sports drinks.  If you are severely dehydrated, your health care provider may recommend that you receive fluids through an IV tube in the hospital.  Do not take sodium tablets. Doing that can lead to the condition of having too much sodium in your body (hypernatremia). Eating while you rehydrate Follow instructions from your health care provider about what to eat while you rehydrate. Your health care provider may recommend that you slowly begin eating regular foods in small amounts.  Eat foods that contain a healthy balance of electrolytes, such as bananas, oranges, potatoes, tomatoes, and spinach.  Avoid foods that are greasy or contain a lot of fat or sugar.  In some cases, you may get nutrition through a feeding tube that is passed through your nose and into your stomach (nasogastric tube, or NG tube). This may be done if you have uncontrolled vomiting or diarrhea. Beverages to avoid Certain beverages may make dehydration worse. While you rehydrate, avoid:  Alcohol.  Caffeine.  Drinks that contain a lot of sugar. These include: ? High-calorie  sports drinks. ? Fruit juice that is not diluted. ? Soda.  Check nutrition labels to see how much sugar or caffeine a beverage contains. Signs of dehydration recovery You may be recovering from dehydration if:  You are urinating more often than before you started rehydrating.  Your urine is clear or pale yellow.  Your energy level improves.  You vomit less frequently.  You have diarrhea less frequently.  Your appetite improves or returns to normal.  You feel less dizzy or less light-headed.  Your skin tone and color start to look more normal. Contact a health care provider if:  You continue to have symptoms of mild dehydration, such as: ? Thirst. ? Dry lips. ? Slightly dry mouth. ? Dry, warm skin. ? Dizziness.  You continue to vomit or have diarrhea. Get help right away if:  You have symptoms of dehydration that get worse.  You feel: ? Confused. ? Weak. ? Like you are going to faint.  You have not urinated in 6-8 hours.  You have very dark urine.  You have trouble breathing.  Your heart rate while sitting still is over 100 beats a minute.  You cannot drink fluids without vomiting.  You have vomiting or diarrhea that: ?  Gets worse. ? Does not go away.  You have a fever. This information is not intended to replace advice given to you by your health care provider. Make sure you discuss any questions you have with your health care provider. Document Released: 09/25/2011 Document Revised: 01/21/2016 Document Reviewed: 08/27/2015 Elsevier Interactive Patient Education  2019 [employer].

## 2018-12-24 NOTE — Progress Notes (Signed)
[facility] OFFICE PROGRESS NOTE  Rutherford Guys, MD [address] [name] [id number]  DIAGNOSIS: Limited stage (T2b, N2,M0) small cell lung cancerdiagnosed inJanuary 2020. She presented with right lower lobe lung mass in addition to right hilar and mediastinal lymphadenopathy  PRIOR THERAPY: None  CURRENT THERAPY: Systemic chemotherapy concurrent with radiation. Chemotherapy withcisplatin 80 mg/M2 on day 1 and etoposide 100 mg/M2 on days 1, 2 and 3 every 3 weeks.First dose September 03, 2018. Status post5cycles.Dose reduced starting from cycle #4to cisplatin 40 mg/m2 and etoposide 80 mg/m2 due to renal insufficiency.  INTERVAL HISTORY: [name] 68 y.o. female returns to the clinic for a follow-up visit.  The patient is feeling fairly well today no any concerning complaints.  She has been tolerating treatment fair except for electrolyte disturbances, pancytopenia requiring blood transfusions (most recent being 12/10/2018), and kidney insufficiency. The patient has a history of a GI bleed due to a bleeding ulcer. She also was again hospitalized for a presumed GI bleed in April, however, no evidence of a GI bleed was found. She denies any known hematochezia, epistaxis, or hematuria at this time. She states her stool is dark but she attributes that to her iron supplements.    She tolerated her last treatment fairly well without any adverse effects. She denied any fevers, chills, or night sweats. She continued to lose weight since her last appointment. She is followed by nutrition. She denies any chest pain, shortness of breath, cough, or hemoptysis. She denies any nausea, vomiting, or constipation. She occasionally has diarrhea from her magnesium supplements. She takes about 4 tablets a day. She denies headaches or visual changes. She is here today for evaluation before starting her last cycle of treatment.   MEDICAL HISTORY: Past Medical History:  Diagnosis  Date  . Arthritis   . Chronic pain   . Hyperlipidemia   . Hypertension   . Low blood magnesium 10/04/2018  . lung ca dx'd 06/2018  . Stroke ([place])    06/19/2018 minor    ALLERGIES:  has No Known Allergies.  MEDICATIONS:  Current Outpatient Medications  Medication Sig Dispense Refill  . acetaminophen (TYLENOL) 650 MG CR tablet Take 650 mg by mouth every 8 (eight) hours as needed for pain.    Marland Kitchen atorvastatin (LIPITOR) 80 MG tablet Take 1 tablet (80 mg total) by mouth daily at 6 PM. 90 tablet 3  . butalbital-acetaminophen-caffeine (FIORICET) 50-325-40 MG tablet Take 1-2 tablets by mouth every 6 (six) hours as needed for headache. 20 tablet 0  . feeding supplement, ENSURE ENLIVE, (ENSURE ENLIVE) LIQD Take 237 mLs by mouth 2 (two) times daily between meals. 60 Bottle 0  . ferrous sulfate 325 (65 FE) MG tablet Take 325 mg by mouth daily with breakfast.    . lisinopril (ZESTRIL) 40 MG tablet TAKE 1 TABLET BY MOUTH EVERY DAY 90 tablet 0  . magnesium oxide (MAG-OX) 400 MG tablet TAKE 1 TABLET BY MOUTH 3 TIMES A DAY (Patient taking differently: Take 400 mg by mouth 4 (four) times daily. ) 90 tablet 0  . ondansetron (ZOFRAN) 4 MG tablet Take 1 tablet (4 mg total) by mouth every 8 (eight) hours as needed for nausea or vomiting (give 2nd). 30 tablet 0  . pantoprazole (PROTONIX) 40 MG tablet TAKE 1 TABLET (40 MG TOTAL) BY MOUTH 2 (TWO) TIMES DAILY BEFORE A MEAL. 180 tablet 1  . potassium chloride SA (K-DUR) 20 MEQ tablet Take 1 tablet (20 mEq total) by mouth  daily. 5 tablet 0  . prochlorperazine (COMPAZINE) 10 MG tablet Take 1 tablet (10 mg total) by mouth every 6 (six) hours as needed for nausea or vomiting. 30 tablet 1  . lidocaine-prilocaine (EMLA) cream Apply 1 application topically as needed. (Patient not taking: Reported on 12/12/2018) 30 g 0  . oxyCODONE-acetaminophen (PERCOCET/ROXICET) 5-325 MG tablet Take 1 tablet by mouth every 8 (eight) hours as needed for severe pain. (Patient not taking:  Reported on 12/12/2018) 15 tablet 0   No current facility-administered medications for this visit.    Facility-Administered Medications Ordered in Other Visits  Medication Dose Route Frequency Provider Last Rate Last Dose  . 0.9 %  sodium chloride infusion (Manually program via Guardrails IV Fluids)  250 mL Intravenous Once ,  L, PA-C      . heparin lock flush 100 unit/mL  500 Units Intracatheter Daily PRN ,  L, PA-C      . sodium chloride flush (NS) 0.9 % injection 10 mL  10 mL Intracatheter PRN ,  L, PA-C        SURGICAL HISTORY:  Past Surgical History:  Procedure Laterality Date  . BIOPSY  06/19/2018   Procedure: BIOPSY;  Surgeon: Laurence Spates, MD;  Location: [facility];  Service: Endoscopy;;  . BIOPSY  10/11/2018   Procedure: BIOPSY;  Surgeon: Laurence Spates, MD;  Location: [facility];  Service: Endoscopy;;  . ENDARTERECTOMY Right 06/26/2018   Procedure: ENDARTERECTOMY CAROTID RIGHT;  Surgeon: Serafina Mitchell, MD;  Location: [facility];  Service: Vascular;  Laterality: Right;  . ESOPHAGOGASTRODUODENOSCOPY (EGD) WITH PROPOFOL N/A 06/19/2018   Procedure: ESOPHAGOGASTRODUODENOSCOPY (EGD) WITH PROPOFOL;  Surgeon: Laurence Spates, MD;  Location: [facility];  Service: Endoscopy;  Laterality: N/A;  . FLEXIBLE SIGMOIDOSCOPY N/A 10/11/2018   Procedure: FLEXIBLE SIGMOIDOSCOPY;  Surgeon: Laurence Spates, MD;  Location: [facility];  Service: Endoscopy;  Laterality: N/A;  . IR IMAGING GUIDED PORT INSERTION  10/07/2018  . PATCH ANGIOPLASTY Right 06/26/2018   Procedure: PATCH ANGIOPLASTY USING A XENOSURE 1cm x 6cm BIOLOGIC PATCH;  Surgeon: Serafina Mitchell, MD;  Location: [place];  Service: Vascular;  Laterality: Right;  Marland Kitchen VIDEO BRONCHOSCOPY WITH ENDOBRONCHIAL ULTRASOUND N/A 08/14/2018   Procedure: VIDEO BRONCHOSCOPY WITH ENDOBRONCHIAL ULTRASOUND;  Surgeon: Collene Gobble, MD;  Location: [facility] OR;  Service: Thoracic;  Laterality: N/A;     REVIEW OF SYSTEMS:   Review of Systems  Constitutional: Positive for weight loss and fatigue. Negative for appetite change, chills, and fever.  HENT:   Negative for mouth sores, nosebleeds, sore throat and trouble swallowing.   Eyes: Negative for eye problems and icterus.  Respiratory: Negative for cough, hemoptysis, shortness of breath and wheezing.   Cardiovascular: Negative for chest pain and leg swelling.  Gastrointestinal: Positive for dark stool. Negative for abdominal pain, constipation, diarrhea, nausea and vomiting.  Genitourinary: Negative for bladder incontinence, difficulty urinating, dysuria, frequency and hematuria.   Musculoskeletal: Positive for chronic back pain. Negative for gait problem, neck pain and neck stiffness.  Skin: Negative for itching and rash.  Neurological: Negative for dizziness, extremity weakness, gait problem, headaches, light-headedness and seizures.  Hematological: Negative for adenopathy. Does not bruise/bleed easily.  Psychiatric/Behavioral: Negative for confusion, depression and sleep disturbance. The patient is not nervous/anxious.     PHYSICAL EXAMINATION:  Blood pressure (!) 157/86, pulse 84, temperature 98.7 F (37.1 C), temperature source Oral, resp. rate 18, height 5\' 5"  (1.651 m), weight 183 lb 1.6 oz (83.1 kg), SpO2 95 %.  ECOG PERFORMANCE STATUS: 1 -  Symptomatic but completely ambulatory  Physical Exam  Constitutional: Oriented to person, place, and time and well-developed, well-nourished, and in no distress.  HENT:  Head: Normocephalic and atraumatic.  Mouth/Throat: Oropharynx is clear and moist. No oropharyngeal exudate.  Eyes: Conjunctivae are normal. Right eye exhibits no discharge. Left eye exhibits no discharge. No scleral icterus.  Neck: Normal range of motion. Neck supple.  Cardiovascular: Normal rate, regular rhythm, normal heart sounds and intact distal pulses.   Pulmonary/Chest: Effort normal and breath sounds normal. No  respiratory distress. No wheezes. No rales.  Abdominal: Soft. Bowel sounds are normal. Exhibits no distension and no mass. There is no tenderness.  Musculoskeletal: Normal range of motion. Exhibits no edema.  Lymphadenopathy:    No cervical adenopathy.  Neurological: Alert and oriented to person, place, and time. Exhibits normal muscle tone. Gait normal. Coordination normal.  Skin: Skin is warm and dry. No rash noted. Not diaphoretic. No erythema. No pallor.  Psychiatric: Mood, memory and judgment normal.  Vitals reviewed.  LABORATORY DATA: Lab Results  Component Value Date   WBC 4.6 12/24/2018   HGB 7.0 (L) 12/24/2018   HCT 22.1 (L) 12/24/2018   MCV 102.3 (H) 12/24/2018   PLT 134 (L) 12/24/2018      Chemistry      Component Value Date/Time   NA 140 12/24/2018 0752   NA 142 08/02/2018 1042   K 4.3 12/24/2018 0752   CL 107 12/24/2018 0752   CO2 24 12/24/2018 0752   BUN 15 12/24/2018 0752   BUN 9 08/02/2018 1042   CREATININE 1.80 (H) 12/24/2018 0752      Component Value Date/Time   CALCIUM 8.5 (L) 12/24/2018 0752   ALKPHOS 93 12/24/2018 0752   AST 12 (L) 12/24/2018 0752   ALT <6 12/24/2018 0752   BILITOT 0.3 12/24/2018 0752       RADIOGRAPHIC STUDIES:  No results found.   ASSESSMENT/PLAN:  This is a very pleasant 68 year old Caucasian female recently diagnosed with limited stage (T2b, N2, M0) small cell lung cancer.  She presented with a right lower lobe lung mass in addition to right hilar mediastinal lymphadenopathy.  She was diagnosed in January 2020.   The patient is currently undergoing treatment with cisplatin 80 mg on day 1 and etoposide 100 mg/m on days 1, 2, and 3 every 3 weeks. Starting from cycle #4, the cisplatin was reduced to 40 mg/m2 and etoposide 80 mg/m2 due to her renal function. She is status post 5 cycles.  She has experienced pancytopenia and electrolyte abnormalities with her treatment. She also experienced odynophagia and weight loss secondary  to radiation treatment.  She completed radiation on November 05, 2018.    The patient was seen with Dr. Julien Nordmann today. Labs were reviewed with Dr. Julien Nordmann. That patient's received a blood transfusion on 12/10/2018. Her hemoglobin has dropped again to 7.0 today. We will arrange for the patient to receive 1 unit of blood today. Additionally, given her history of GI bleeding, we will give the patient fecal occult stool cards to evaluate for GI blood loss.   The patient's creatinine is 1.8 today. We will arrange for the patient to receive a liter of fluid today.   Dr. Julien Nordmann had a discussion with the patient about her current condition. Dr. Julien Nordmann recommends discontinuing therapy with close monitoring due to her several lab abnormalities including renal insuffiencey and anemia/thrombocytopenia. The patient has received 5 total cycles of treatment, in which guidelines recommend a total of 4-6 cycles of  treatment.   I will arrange for a restaging CT scan to be performed in 2 months.   We will see her back for a follow up visit in 2 months for evaluation and to review her scan results.   The patient was advised to call immediately if she has any concerning symptoms in the interval. The patient voices understanding of current disease status and treatment options and is in agreement with the current care plan. All questions were answered. The patient knows to call the clinic with any problems, questions or concerns. We can certainly see the patient much sooner if necessary   Orders Placed This Encounter  Procedures  . CT Chest Wo Contrast    Standing Status:   Future    Standing Expiration Date:   12/24/2019    Order Specific Question:   ** REASON FOR EXAM (FREE TEXT)    Answer:   Restaging Lung Cancer    Order Specific Question:   Preferred imaging location?    Answer:   [facility]    Order Specific Question:   Radiology Contrast Protocol - do NOT remove file path    Answer:    [email or web address]  . Occult blood card to lab, stool    Standing Status:   Future    Standing Expiration Date:   12/24/2019  . Occult blood card to lab, stool    Standing Status:   Future    Standing Expiration Date:   12/24/2019  . Occult blood card to lab, stool    Standing Status:   Future    Standing Expiration Date:   12/24/2019  . CMP ([place] only)    Standing Status:   Future    Standing Expiration Date:   12/24/2019  . CBC with Differential (Cancer Center Only)    Standing Status:   Future    Standing Expiration Date:   12/24/2019  . CBC with Differential (Cancer Center Only)    Standing Status:   Future    Standing Expiration Date:   12/24/2019  . CMP ([place] only)    Standing Status:   Future    Standing Expiration Date:   12/24/2019  . Practitioner attestation of consent    I, the ordering practitioner, attest that I have discussed with the patient the benefits, risks, side effects, alternatives, likelihood of achieving goals and potential problems during recovery for the procedure listed.    Standing Status:   Future    Standing Expiration Date:   12/24/2019    Order Specific Question:   Procedure    Answer:   Blood Product(s)  . Complete patient signature process for consent form    Standing Status:   Future    Standing Expiration Date:   12/24/2019  . Care order/instruction    Transfuse Parameters    Standing Status:   Future    Standing Expiration Date:   12/24/2019  . Type and screen    Standing Status:   Future    Number of Occurrences:   1    Standing Expiration Date:   12/24/2019  . Sample to Blood Bank    Standing Status:   Future    Standing Expiration Date:   12/24/2019      L , PA-C 12/24/18  ADDENDUM: Hematology/Oncology Attending: I had a face-to-face encounter with the patient today.  I recommended her care plan.  This is a very pleasant 68 years old white female with limited stage small cell lung  cancer  status post systemic chemotherapy with reduced dose cisplatin and etoposide concurrent with radiation.  She is status post 5 cycles.  The patient has a rough time with this treatment with dehydration, renal insufficiency as well as pancytopenia and hypomagnesemia. I had a lengthy discussion with the patient today about her current condition and treatment options. I recommended for the patient to discontinue her systemic chemotherapy at this point because of the significant adverse effects.  She will not proceed with cycle #6 today as planned. I will arrange for the patient to come back for follow-up visit in 2 months for evaluation and repeat CT scan of the chest for restaging of her disease. I also encouraged the patient to Dr. Dr. Sondra Come about phylactic cranial irradiation. For the chemotherapy-induced anemia, we will arrange for the patient to receive 1 unit of PRBCs transfusion today. For the renal insufficiency and poor p.o. intake and dehydration, will arrange for the patient to receive IV fluids with normal saline today. She was advised to call immediately if she has any concerning symptoms in the interval.  Disclaimer: This note was dictated with voice recognition software. Similar sounding words can inadvertently be transcribed and may be missed upon review. Eilleen Kempf, MD 12/24/18

## 2018-12-25 ENCOUNTER — Inpatient Hospital Stay: Payer: Medicare Other

## 2018-12-25 ENCOUNTER — Telehealth: Payer: Self-pay | Admitting: Internal Medicine

## 2018-12-25 ENCOUNTER — Telehealth: Payer: Self-pay | Admitting: Physician Assistant

## 2018-12-25 LAB — TYPE AND SCREEN
ABO/RH(D): A POS
Antibody Screen: NEGATIVE
Unit division: 0

## 2018-12-25 LAB — BPAM RBC
Blood Product Expiration Date: 202006232359
ISSUE DATE / TIME: 202006091123
Unit Type and Rh: 6200

## 2018-12-25 NOTE — Telephone Encounter (Signed)
Scheduled appt per 6/09 los - unable to reach pt - unable to leave message - sent message to another scheduler to see if they can contact.

## 2018-12-25 NOTE — Telephone Encounter (Signed)
Called to inform patient of her appts that were scheduled per 6/9 los. Spoke with patient and patient is aware of her scheduled appt dates and times.

## 2018-12-26 ENCOUNTER — Ambulatory Visit: Payer: Medicare Other

## 2018-12-31 ENCOUNTER — Other Ambulatory Visit: Payer: Self-pay | Admitting: *Deleted

## 2018-12-31 ENCOUNTER — Inpatient Hospital Stay: Payer: Medicare Other

## 2018-12-31 ENCOUNTER — Other Ambulatory Visit: Payer: Self-pay

## 2018-12-31 DIAGNOSIS — D649 Anemia, unspecified: Secondary | ICD-10-CM

## 2018-12-31 DIAGNOSIS — C3431 Malignant neoplasm of lower lobe, right bronchus or lung: Secondary | ICD-10-CM

## 2018-12-31 LAB — CMP (CANCER CENTER ONLY)
ALT: 6 U/L (ref 0–44)
AST: 10 U/L — ABNORMAL LOW (ref 15–41)
Albumin: 3.5 g/dL (ref 3.5–5.0)
Alkaline Phosphatase: 87 U/L (ref 38–126)
Anion gap: 9 (ref 5–15)
BUN: 14 mg/dL (ref 8–23)
CO2: 23 mmol/L (ref 22–32)
Calcium: 8.8 mg/dL — ABNORMAL LOW (ref 8.9–10.3)
Chloride: 107 mmol/L (ref 98–111)
Creatinine: 1.76 mg/dL — ABNORMAL HIGH (ref 0.44–1.00)
GFR, Est AFR Am: 34 mL/min — ABNORMAL LOW (ref >60)
GFR, Estimated: 30 mL/min — ABNORMAL LOW (ref >60)
Glucose, Bld: 92 mg/dL (ref 70–99)
Potassium: 4.5 mmol/L (ref 3.5–5.1)
Sodium: 139 mmol/L (ref 135–145)
Total Bilirubin: 0.3 mg/dL (ref 0.3–1.2)
Total Protein: 6 g/dL — ABNORMAL LOW (ref 6.5–8.1)

## 2018-12-31 LAB — OCCULT BLOOD X 1 CARD TO LAB, STOOL
Fecal Occult Bld: NEGATIVE
Fecal Occult Bld: NEGATIVE
Fecal Occult Bld: NEGATIVE

## 2018-12-31 LAB — CBC WITH DIFFERENTIAL (CANCER CENTER ONLY)
Abs Immature Granulocytes: 0.01 10*3/uL (ref 0.00–0.07)
Basophils Absolute: 0 10*3/uL (ref 0.0–0.1)
Basophils Relative: 0 %
Eosinophils Absolute: 0 10*3/uL (ref 0.0–0.5)
Eosinophils Relative: 0 %
HCT: 26.6 % — ABNORMAL LOW (ref 36.0–46.0)
Hemoglobin: 8.4 g/dL — ABNORMAL LOW (ref 12.0–15.0)
Immature Granulocytes: 0 %
Lymphocytes Relative: 12 %
Lymphs Abs: 0.6 10*3/uL — ABNORMAL LOW (ref 0.7–4.0)
MCH: 32.3 pg (ref 26.0–34.0)
MCHC: 31.6 g/dL (ref 30.0–36.0)
MCV: 102.3 fL — ABNORMAL HIGH (ref 80.0–100.0)
Monocytes Absolute: 0.7 10*3/uL (ref 0.1–1.0)
Monocytes Relative: 14 %
Neutro Abs: 3.3 10*3/uL (ref 1.7–7.7)
Neutrophils Relative %: 74 %
Platelet Count: 170 10*3/uL (ref 150–400)
RBC: 2.6 MIL/uL — ABNORMAL LOW (ref 3.87–5.11)
RDW: 22.3 % — ABNORMAL HIGH (ref 11.5–15.5)
WBC Count: 4.6 10*3/uL (ref 4.0–10.5)
nRBC: 0 % (ref 0.0–0.2)

## 2018-12-31 LAB — SAMPLE TO BLOOD BANK

## 2019-01-01 ENCOUNTER — Telehealth: Payer: Self-pay | Admitting: *Deleted

## 2019-01-01 NOTE — Telephone Encounter (Signed)
Notified that hemoccult cards were negative for blood and Hgb is better.

## 2019-01-13 ENCOUNTER — Encounter: Payer: Medicare Other | Admitting: Family Medicine

## 2019-01-13 ENCOUNTER — Telehealth: Payer: Self-pay | Admitting: Family Medicine

## 2019-01-13 NOTE — Telephone Encounter (Signed)
Pt had an appt today and md was unavailable. Pt is needing a refill on chlorthalidone 25 mg. [facility] [address]. Pt will callback to schedule another appt the office is closed for lunch

## 2019-01-13 NOTE — Telephone Encounter (Signed)
Pt's TOC appt rescheduled from today to 7/21. Pt needs med refills. Pt could not pronounce med. She gave me rx number [id number].

## 2019-01-14 ENCOUNTER — Other Ambulatory Visit: Payer: Self-pay | Admitting: Family Medicine

## 2019-01-14 ENCOUNTER — Telehealth: Payer: Self-pay | Admitting: Family Medicine

## 2019-01-14 ENCOUNTER — Other Ambulatory Visit: Payer: Self-pay | Admitting: Physician Assistant

## 2019-01-14 NOTE — Telephone Encounter (Signed)
Patient called back to follow up on her medication chlorthalidone (HYGROTON) 25 MG tablet [Pharmacy Med Name: CHLORTHALIDONE 25 MG TABLET] and wants to know if there is anyway that it can be signed off on today and sent to pharmacy.

## 2019-01-14 NOTE — Telephone Encounter (Signed)
Pt called again about this med. She has just got finished taking her Chemo treatments and does not want the fluid in her body to build up. Can another provider look at refilling this?

## 2019-01-15 ENCOUNTER — Other Ambulatory Visit: Payer: Self-pay | Admitting: Family Medicine

## 2019-01-16 MED ORDER — AMLODIPINE BESYLATE 5 MG PO TABS: 5.0000 mg | ORAL_TABLET | Freq: Every day | ORAL | 0 refills | Status: DC

## 2019-01-16 NOTE — Telephone Encounter (Signed)
Patient's chlorthalidone was stopped during hosp in April, due to worsening ckd and hypomagnesium, however she has continued to take She denies any edema Has been checking BP at home: 105-140/70s Patient reports she is pain constantly - back, R leg, L arm Used to take goody powder, otherwise currently not taking any medications   BP Readings from Last 3 Encounters:  12/24/18 (!) 180/99  12/24/18 (!) 157/86  12/12/18 (!) 145/80    Lab Results  Component Value Date   CREATININE 1.76 (H) 12/31/2018   CREATININE 1.80 (H) 12/24/2018   CREATININE 1.46 (H) 12/17/2018   DC chlorthalidone Start amlodipine

## 2019-01-20 ENCOUNTER — Ambulatory Visit (HOSPITAL_COMMUNITY)
Admission: RE | Admit: 2019-01-20 | Discharge: 2019-01-20 | Disposition: A | Discharge status: Home / Self Care | Payer: Medicare Other | Source: Ambulatory Visit | Attending: Medical | Admitting: Medical

## 2019-01-20 ENCOUNTER — Inpatient Hospital Stay: Payer: Medicare Other | Attending: Internal Medicine | Admitting: Medical

## 2019-01-20 ENCOUNTER — Telehealth: Payer: Self-pay | Admitting: Medical

## 2019-01-20 ENCOUNTER — Other Ambulatory Visit: Payer: Self-pay

## 2019-01-20 ENCOUNTER — Other Ambulatory Visit: Payer: Self-pay | Admitting: Emergency Medicine

## 2019-01-20 VITALS — BP 155/98 | HR 89 | Temp 99.1°F | Resp 18 | Ht 65.0 in | Wt 176.0 lb

## 2019-01-20 DIAGNOSIS — Z87891 Personal history of nicotine dependence: Secondary | ICD-10-CM | POA: Diagnosis not present

## 2019-01-20 DIAGNOSIS — I10 Essential (primary) hypertension: Secondary | ICD-10-CM | POA: Insufficient documentation

## 2019-01-20 DIAGNOSIS — Z8673 Personal history of transient ischemic attack (TIA), and cerebral infarction without residual deficits: Secondary | ICD-10-CM | POA: Diagnosis not present

## 2019-01-20 DIAGNOSIS — H9193 Unspecified hearing loss, bilateral: Secondary | ICD-10-CM | POA: Diagnosis present

## 2019-01-20 DIAGNOSIS — Z79899 Other long term (current) drug therapy: Secondary | ICD-10-CM | POA: Insufficient documentation

## 2019-01-20 DIAGNOSIS — C3431 Malignant neoplasm of lower lobe, right bronchus or lung: Secondary | ICD-10-CM | POA: Diagnosis present

## 2019-01-20 DIAGNOSIS — Z923 Personal history of irradiation: Secondary | ICD-10-CM | POA: Diagnosis not present

## 2019-01-20 DIAGNOSIS — G629 Polyneuropathy, unspecified: Secondary | ICD-10-CM | POA: Diagnosis not present

## 2019-01-20 DIAGNOSIS — R0781 Pleurodynia: Secondary | ICD-10-CM

## 2019-01-20 DIAGNOSIS — Z9221 Personal history of antineoplastic chemotherapy: Secondary | ICD-10-CM | POA: Diagnosis not present

## 2019-01-20 DIAGNOSIS — Z801 Family history of malignant neoplasm of trachea, bronchus and lung: Secondary | ICD-10-CM

## 2019-01-20 DIAGNOSIS — E785 Hyperlipidemia, unspecified: Secondary | ICD-10-CM | POA: Diagnosis not present

## 2019-01-20 DIAGNOSIS — H539 Unspecified visual disturbance: Secondary | ICD-10-CM | POA: Diagnosis not present

## 2019-01-20 MED ORDER — GABAPENTIN 300 MG PO CAPS: 300.0000 mg | ORAL_CAPSULE | Freq: Every day | ORAL | 5 refills | Status: DC

## 2019-01-20 NOTE — Telephone Encounter (Signed)
No los per 7/6.

## 2019-01-20 NOTE — Progress Notes (Signed)
[facility] Progress Note   [name] [id number] 1950/10/03 68 y.o.  [name] is managed by Dr. Julien Nordmann  Actively treated with chemotherapy/immunotherapy/hormonal therapy: no  Last treated: 11/05/2018 (radiation)  Next scheduled appointment with provider: 02/24/2019  Assessment: Plan:    Small cell carcinoma of lower lobe of right lung (HCC)  Bilateral hearing loss, unspecified hearing loss type  Neuropathy  Rib pain   Small cell lung cancer of the right lung: The patient continues to be followed by Dr. Julien Nordmann and is status post concurrent radiation therapy with cisplatin and etoposide.  Her last radiation therapy treatment was completed on 11/05/2018.  She is scheduled to see Dr. Julien Nordmann in follow-up on 02/24/2019.   Bilateral hearing loss and visual changes: [name] was referred for an MRI of her brain today which returned showing:  Brain: No swelling or mass/mass effect to suggest metastatic disease. Chronic small vessel ischemia with remote insults in the bilateral cerebral white matter, more numerous on the right where there are lacunes aligned along the deep white matter watershed territory. Small remote left cerebellar infarct. No acute infarct, blood products, hydrocephalus, or collection.  Vascular: Major flow voids are preserved  Skull and upper cervical spine: Negative for marrow lesion. C2-3 facet ankylosis with C3-4 advanced facet arthropathy.  Sinuses/Orbits: Partial bilateral mastoid opacification.  She was called with these results and was told that her hearing changes could be secondary to her bilateral cerumen impactions and her prior treatment with cisplatin.  She will attempt to remove the cerumen in her ears with a home ear cleaning kit.  Should this not be of benefit then she may contact our office for referral to [place].   Neuropathy: The patient was given a prescription for gabapentin 300 mg p.o. nightly.    Bilateral rib pain: The patient was told that her bilateral rib pain could be costochondritis related to her increased levels of activity.   Please see After Visit Summary for patient specific instructions.  Future Appointments  Date Time Provider [place]  02/04/2019  3:20 PM Rutherford Guys, MD PCP-PCP [facility]  02/21/2019  9:30 AM [facility] 2 [facility] None  02/21/2019  9:45 AM [facility] [place] None  02/24/2019 11:30 AM Curt Bears, MD [facility] None    No orders of the defined types were placed in this encounter.      Subjective:   Patient ID:  [name] is a 68 y.o. (DOB Jan 31, 1951) female.  Chief Complaint: No chief complaint on file.   HPI [name]  is a 68 year old female diagnosed with a limited stage (T2b, N2, M0) small cell lung cancer when she presentedwith a right lower lobe lung mass in addition to right hilar mediastinal lymphadenopathy in January 2020. She was treated with cisplatinand etoposide with concurrent radiation treatment whicj she completed on November 05, 2018.  She presents to the clinic today stating that she is having bilateral hearing loss, STM loss, visual changes, numbness in her right knee and left hand, shooting pain in her sides bilaterally.  Bilateral hearing loss, STM loss, visual changes, numbness in her right knee and left hand have occurred since last week.  She has mild diarrhea which is controlled with medications.  She was supposed to see her primary care provider last week but her primary care provider canceled her appointment due to an illness.  She denies chest pain, shortness of breath, dyspnea on exertion, nausea, vomiting, dizziness, weakness, or loss  of consciousness.  Medications: I have reviewed the patient's current medications.  Allergies: No Known Allergies  Past Medical History:  Diagnosis Date  . Arthritis   . Chronic pain   . Hyperlipidemia   . Hypertension   . Low blood  magnesium 10/04/2018  . lung ca dx'd 06/2018  . Stroke ([place])    06/19/2018 minor    Past Surgical History:  Procedure Laterality Date  . BIOPSY  06/19/2018   Procedure: BIOPSY;  Surgeon: Laurence Spates, MD;  [facility]: [facility];  Service: Endoscopy;;  . BIOPSY  10/11/2018   Procedure: BIOPSY;  Surgeon: Laurence Spates, MD;  [facility];  Service: Endoscopy;;  . ENDARTERECTOMY Right 06/26/2018   Procedure: ENDARTERECTOMY CAROTID RIGHT;  Surgeon: Serafina Mitchell, MD;  Location: [facility] OR;  Service: Vascular;  Laterality: Right;  . ESOPHAGOGASTRODUODENOSCOPY (EGD) WITH PROPOFOL N/A 06/19/2018   Procedure: ESOPHAGOGASTRODUODENOSCOPY (EGD) WITH PROPOFOL;  Surgeon: Laurence Spates, MD;  [facility];  Service: Endoscopy;  Laterality: N/A;  . FLEXIBLE SIGMOIDOSCOPY N/A 10/11/2018   Procedure: FLEXIBLE SIGMOIDOSCOPY;  Surgeon: Laurence Spates, MD;  [facility];  Service: Endoscopy;  Laterality: N/A;  . IR IMAGING GUIDED PORT INSERTION  10/07/2018  . PATCH ANGIOPLASTY Right 06/26/2018   Procedure: PATCH ANGIOPLASTY USING A XENOSURE 1cm x 6cm BIOLOGIC PATCH;  Surgeon: Serafina Mitchell, MD;  Location: [place];  Service: Vascular;  Laterality: Right;  Marland Kitchen VIDEO BRONCHOSCOPY WITH ENDOBRONCHIAL ULTRASOUND N/A 08/14/2018   Procedure: VIDEO BRONCHOSCOPY WITH ENDOBRONCHIAL ULTRASOUND;  Surgeon: Collene Gobble, MD;  [facility];  Service: Thoracic;  Laterality: N/A;    Family History  Problem Relation Age of Onset  . Cancer Mother        lung cancer  . Diabetes Other   . Hyperlipidemia Other   . Hypertension Other   . Cancer Other     Social History   Socioeconomic History  . Marital status: Single    Spouse name: Not on file  . Number of children: 0  . Years of education: Not on file  . Highest education level: Not on file  Occupational History  . Not on file  Social Needs  . Financial resource strain: Not on file  . Food insecurity    Worry: Not on file     Inability: Not on file  . Transportation needs    Medical: Not on file    Non-medical: Not on file  Tobacco Use  . Smoking status: Former Smoker    Packs/day: 1.00    Years: 10.00    Pack years: 10.00    Types: Cigarettes    Quit date: 06/19/2018    Years since quitting: 0.5  . Smokeless tobacco: Never Used  . Tobacco comment: Currently using Nicorette Gum  Substance and Sexual Activity  . Alcohol use: Yes    Comment: wine 2 glasses a day   . Drug use: Never    Comment: Hemp Oil  . Sexual activity: Not Currently  Lifestyle  . Physical activity    Days per week: Not on file    Minutes per session: Not on file  . Stress: Not on file  Relationships  . Social Herbalist on phone: Not on file    Gets together: Not on file    Attends religious service: Not on file    Active member of club or organization: Not on file    Attends meetings of clubs or organizations: Not on file  Relationship status: Not on file  . Intimate partner violence    Fear of current or ex partner: Not on file    Emotionally abused: Not on file    Physically abused: Not on file    Forced sexual activity: Not on file  Other Topics Concern  . 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   Please see review of systems for further details on the patient's review from today.   Review of Systems:  Review of Systems  Constitutional: Negative for chills, diaphoresis and fever.  HENT: Positive for hearing loss. Negative for trouble swallowing and voice change.   Eyes: Positive for visual disturbance.  Respiratory: Negative for cough, chest tightness, shortness of breath and wheezing.   Cardiovascular: Positive for chest pain. Negative for palpitations.  Gastrointestinal:  Negative for abdominal pain, constipation, diarrhea, nausea and vomiting.  Musculoskeletal: Negative for back pain and myalgias.  Neurological: Positive for numbness (Right leg and left hand). Negative for dizziness, light-headedness and headaches.    Objective:   Physical Exam:  BP (!) 155/98 (BP Location: Left Arm, Patient Position: Sitting) Comment: Notified Nurse of BP  Pulse 89   Temp 99.1 F (37.3 C) (Temporal)   Resp 18   Ht '5\' 5"'  (1.651 m)   Wt 176 lb (79.8 kg)   SpO2 100%   BMI 29.29 kg/m  ECOG: 1  Physical Exam Constitutional:      General: She is not in acute distress.    Appearance: She is not diaphoretic.  HENT:     Head: Normocephalic and atraumatic.  Eyes:     Conjunctiva/sclera:     Right eye: Right conjunctiva is not injected. No exudate or hemorrhage.    Left eye: Left conjunctiva is not injected. No exudate or hemorrhage.    Funduscopic exam:    Right eye: No hemorrhage, exudate, AV nicking or papilledema.        Left eye: No hemorrhage, exudate, AV nicking or papilledema.  Cardiovascular:     Rate and Rhythm: Normal rate and regular rhythm.     Heart sounds: Normal heart sounds. No murmur. No friction rub. No gallop.   Pulmonary:     Effort: Pulmonary effort is normal. No respiratory distress.     Breath sounds: Normal breath sounds. No wheezing or rales.  Skin:    General: Skin is warm and dry.     Findings: No erythema or rash.  Neurological:     Mental Status: She is alert.     Motor: Motor function is intact. No weakness.     Coordination: Coordination normal.     Gait: Gait normal.     Comments: Upper lower extremity strength 5/5 bilaterally.     Lab Review:     Component Value Date/Time   NA 139 12/31/2018 1322   NA 142 08/02/2018 1042   K 4.5 12/31/2018 1322   CL 107 12/31/2018 1322   CO2 23 12/31/2018 1322   GLUCOSE 92 12/31/2018 1322   BUN 14 12/31/2018 1322   BUN 9 08/02/2018 1042   CREATININE 1.76 (H) 12/31/2018 1322    CALCIUM 8.8 (L) 12/31/2018 1322   PROT 6.0 (L) 12/31/2018 1322  PROT 6.7 08/02/2018 1042   ALBUMIN 3.5 12/31/2018 1322   ALBUMIN 4.0 08/02/2018 1042   AST 10 (L) 12/31/2018 1322   ALT 6 12/31/2018 1322   ALKPHOS 87 12/31/2018 1322   BILITOT 0.3 12/31/2018 1322   GFRNONAA 30 (L) 12/31/2018 1322   GFRAA 34 (L) 12/31/2018 1322       Component Value Date/Time   WBC 4.6 12/31/2018 1322   WBC 3.9 (L) 10/31/2018 1651   RBC 2.60 (L) 12/31/2018 1322   HGB 8.4 (L) 12/31/2018 1322   HGB 12.2 08/02/2018 1042   HCT 26.6 (L) 12/31/2018 1322   HCT 39.6 08/02/2018 1042   PLT 170 12/31/2018 1322   PLT 350 08/02/2018 1042   MCV 102.3 (H) 12/31/2018 1322   MCV 91 08/02/2018 1042   MCH 32.3 12/31/2018 1322   MCHC 31.6 12/31/2018 1322   RDW 22.3 (H) 12/31/2018 1322   RDW 14.3 08/02/2018 1042   LYMPHSABS 0.6 (L) 12/31/2018 1322   MONOABS 0.7 12/31/2018 1322   EOSABS 0.0 12/31/2018 1322   BASOSABS 0.0 12/31/2018 1322   -------------------------------  Imaging from last 24 hours (if applicable):  Radiology interpretation: No results found.      This case was discussed with Dr. Julien Nordmann. He expressed agreement with my management of this patient.

## 2019-01-20 NOTE — Patient Instructions (Signed)

## 2019-01-20 NOTE — Progress Notes (Signed)
Pt reports hearing loss bilat, numbness in L hand and between R knee and R hip, bilat rib soreness, and fatigue.  Denies recent fall/injury/head injury.  A&Ox4.  Denies CP or SOB/DOE increased from baseline.  Denies N/V, reports mild diarrhea controlled with anti-diarrheal medications.  No unilat weakness or facial droop.  No changes in ROM all limbs.  No swelling or redness or injury all limbs.  Pulses palpable all limbs.  MRI ordered and scheduled.  Pt aware of date/time of appt.  Pt escorted via WC to exit with belongings.

## 2019-01-21 ENCOUNTER — Encounter: Payer: Medicare Other | Admitting: Nutrition

## 2019-02-02 NOTE — Telephone Encounter (Signed)
Pt request for chlorthalidone 25 mg denied on 01/15/2019 per pcp pt need to ov to discuss further.  Pt has upcoming appt on 02/04/2019 with [place]. Dgaddy, CMA

## 2019-02-04 ENCOUNTER — Ambulatory Visit (INDEPENDENT_AMBULATORY_CARE_PROVIDER_SITE_OTHER): Payer: Medicare Other | Admitting: Family Medicine

## 2019-02-04 ENCOUNTER — Encounter: Payer: Self-pay | Admitting: Family Medicine

## 2019-02-04 ENCOUNTER — Other Ambulatory Visit: Payer: Self-pay

## 2019-02-04 VITALS — BP 164/103 | HR 92 | Temp 98.5°F | Ht 65.0 in | Wt 185.0 lb

## 2019-02-04 DIAGNOSIS — R202 Paresthesia of skin: Secondary | ICD-10-CM

## 2019-02-04 DIAGNOSIS — G629 Polyneuropathy, unspecified: Secondary | ICD-10-CM

## 2019-02-04 DIAGNOSIS — C3431 Malignant neoplasm of lower lobe, right bronchus or lung: Secondary | ICD-10-CM | POA: Diagnosis not present

## 2019-02-04 DIAGNOSIS — D531 Other megaloblastic anemias, not elsewhere classified: Secondary | ICD-10-CM

## 2019-02-04 DIAGNOSIS — I10 Essential (primary) hypertension: Secondary | ICD-10-CM

## 2019-02-04 MED ORDER — GABAPENTIN 300 MG PO CAPS: 300.0000 mg | ORAL_CAPSULE | Freq: Three times a day (TID) | ORAL | 5 refills | Status: DC

## 2019-02-04 MED ORDER — AMLODIPINE BESYLATE 10 MG PO TABS: 10.0000 mg | ORAL_TABLET | Freq: Every day | ORAL | 3 refills | Status: DC

## 2019-02-04 NOTE — Patient Instructions (Signed)
° ° ° °  If you have lab work done today you will be contacted with your lab results within the next 2 weeks.  If you have not heard from us then please contact us. The fastest way to get your results is to register for My Chart. ° ° °IF you received an x-ray today, you will receive an invoice from [facility]. Please contact [facility] at [phone] with questions or concerns regarding your invoice.  ° °IF you received labwork today, you will receive an invoice from [facility]. Please contact [facility] at [phone] with questions or concerns regarding your invoice.  ° °Our billing staff will not be able to assist you with questions regarding bills from these companies. ° °You will be contacted with the lab results as soon as they are available. The fastest way to get your results is to activate your My Chart account. Instructions are located on the last page of this paperwork. If you have not heard from us regarding the results in 2 weeks, please contact this office. °  ° ° ° °

## 2019-02-04 NOTE — Progress Notes (Signed)
7/21/20203:15 PM  [name] 06-09-1951, 68 y.o., female [id number]  Chief Complaint  Patient presents with  . Hypertension    numbers have been reading very high, noticed high number s 7/18  . Pain    right leg pain, did mri. Was taking gabapentin with no resolve and not able to sleep. Feel slike leg is on fire per pt. Asking for pain meds    HPI:   Patient is a 68 y.o. female with past medical history significant for HTN, CKD3, CVA in 06/2018, HLP, lung cancer, UGIB with h/o transfusion, psoriasis, who presents today for routine followup  Last OV Jan 2020 BP meds adjusted Since diagnosed with right lung cancer - treated with radiation and chemo Has had several episodes of hypomagnesium and hypotension Had flex sigmoidscopy in march - rectal polyps, hemorrhoids after abnormal pet scan Last onc appt July 2020 - mri for hearing loss - no acute changes, gabapentin 300mg  qhs for neuropathy 2/2 chemo, rib pain costochrondritis  Has numbness and tingling in left arm x 2 weeks Left thigh sharp pain over varicose vein - has been applying heat and ice Gabapentin is not helping with pain Has very supportive partner  BP at home well controlled until past several days 150s/80s  Lab Results  Component Value Date   WBC 4.6 12/31/2018   HGB 8.4 (L) 12/31/2018   HCT 26.6 (L) 12/31/2018   MCV 102.3 (H) 12/31/2018   PLT 170 12/31/2018   Lab Results  Component Value Date   TSH 1.470 08/02/2018   Lab Results  Component Value Date   CREATININE 1.76 (H) 12/31/2018   BUN 14 12/31/2018   NA 139 12/31/2018   K 4.5 12/31/2018   CL 107 12/31/2018   CO2 23 12/31/2018    Depression screen PHQ 2/9 06/21/2018  Decreased Interest 0  Down, Depressed, Hopeless 0  PHQ - 2 Score 0    Fall Risk  02/04/2019 02/04/2019 08/02/2018 06/21/2018  Falls in the past year? 0 0 1 0  Number falls in past yr: 0 0 0 -  Injury with Fall? 0 0 0 -     No Known Allergies  Prior to Admission  medications   Medication Sig Start Date End Date Taking? Authorizing Provider  acetaminophen (TYLENOL) 650 MG CR tablet Take 650 mg by mouth every 8 (eight) hours as needed for pain.   Yes [provider]  amLODipine (NORVASC) 5 MG tablet Take 1 tablet (5 mg total) by mouth daily. 01/16/19  Yes Rutherford Guys, MD  atorvastatin (LIPITOR) 80 MG tablet Take 1 tablet (80 mg total) by mouth daily at 6 PM. 08/06/18  Yes Rutherford Guys, MD  feeding supplement, ENSURE ENLIVE, (ENSURE ENLIVE) LIQD Take 237 mLs by mouth 2 (two) times daily between meals. 11/03/18  Yes Mikhail, Weldon, DO  ferrous sulfate 325 (65 FE) MG tablet Take 325 mg by mouth daily with breakfast.   Yes [provider]  gabapentin (NEURONTIN) 300 MG capsule Take 1 capsule (300 mg total) by mouth at bedtime. 01/20/19  Yes Tanner, Lyndon Code., PA-C  lidocaine-prilocaine (EMLA) cream Apply 1 application topically as needed. 10/08/18  Yes Curt Bears, MD  lisinopril (ZESTRIL) 40 MG tablet TAKE 1 TABLET BY MOUTH EVERY DAY 11/20/18  Yes Rutherford Guys, MD  magnesium oxide (MAG-OX) 400 MG tablet TAKE 1 TABLET BY MOUTH THREE TIMES A DAY 01/15/19  Yes Heilingoetter, Cassandra L, PA-C  pantoprazole (PROTONIX) 40 MG tablet TAKE 1  TABLET (40 MG TOTAL) BY MOUTH 2 (TWO) TIMES DAILY BEFORE A MEAL. 10/16/18  Yes Curt Bears, MD  potassium chloride SA (K-DUR) 20 MEQ tablet Take 1 tablet (20 mEq total) by mouth daily. 11/03/18  Yes Mikhail, Velta Addison, DO  butalbital-acetaminophen-caffeine (FIORICET) 250 149 3658 MG tablet Take 1-2 tablets by mouth every 6 (six) hours as needed for headache. Patient not taking: Reported on 02/04/2019 11/03/18   Cristal Ford, DO  oxyCODONE-acetaminophen (PERCOCET/ROXICET) 5-325 MG tablet Take 1 tablet by mouth every 8 (eight) hours as needed for severe pain. Patient not taking: Reported on 02/04/2019 10/08/18   Curt Bears, MD    Past Medical History:  Diagnosis Date  . Arthritis   . Chronic pain   .  Hyperlipidemia   . Hypertension   . Low blood magnesium 10/04/2018  . lung ca dx'd 06/2018  . Stroke ([place])    06/19/2018 minor    Past Surgical History:  Procedure Laterality Date  . BIOPSY  06/19/2018   Procedure: BIOPSY;  Surgeon: Laurence Spates, MD;  [facility];  Service: Endoscopy;;  . BIOPSY  10/11/2018   Procedure: BIOPSY;  Surgeon: Laurence Spates, MD;  [facility];  Service: Endoscopy;;  . ENDARTERECTOMY Right 06/26/2018   Procedure: ENDARTERECTOMY CAROTID RIGHT;  Surgeon: Serafina Mitchell, MD;  [facility] OR;  Service: Vascular;  Laterality: Right;  . ESOPHAGOGASTRODUODENOSCOPY (EGD) WITH PROPOFOL N/A 06/19/2018   Procedure: ESOPHAGOGASTRODUODENOSCOPY (EGD) WITH PROPOFOL;  Surgeon: Laurence Spates, MD;  [facility];  Service: Endoscopy;  Laterality: N/A;  . FLEXIBLE SIGMOIDOSCOPY N/A 10/11/2018   Procedure: FLEXIBLE SIGMOIDOSCOPY;  Surgeon: Laurence Spates, MD;  [facility];  Service: Endoscopy;  Laterality: N/A;  . IR IMAGING GUIDED PORT INSERTION  10/07/2018  . PATCH ANGIOPLASTY Right 06/26/2018   Procedure: PATCH ANGIOPLASTY USING A XENOSURE 1cm x 6cm BIOLOGIC PATCH;  Surgeon: Serafina Mitchell, MD;  Location: [place];  Service: Vascular;  Laterality: Right;  Marland Kitchen VIDEO BRONCHOSCOPY WITH ENDOBRONCHIAL ULTRASOUND N/A 08/14/2018   Procedure: VIDEO BRONCHOSCOPY WITH ENDOBRONCHIAL ULTRASOUND;  Surgeon: Collene Gobble, MD;  [facility];  Service: Thoracic;  Laterality: N/A;    Social History   Tobacco Use  . Smoking status: Former Smoker    Packs/day: 1.00    Years: 10.00    Pack years: 10.00    Types: Cigarettes    Quit date: 06/19/2018    Years since quitting: 0.6  . Smokeless tobacco: Never Used  . Tobacco comment: Currently using Nicorette Gum  Substance Use Topics  . Alcohol use: Yes    Comment: wine 2 glasses a day     Family History  Problem Relation Age of Onset  . Cancer Mother        lung cancer  . Diabetes Other   .  Hyperlipidemia Other   . Hypertension Other   . Cancer Other     Review of Systems  Constitutional: Negative for chills and fever.  Respiratory: Negative for cough and shortness of breath.   Cardiovascular: Negative for chest pain, palpitations and leg swelling.  Gastrointestinal: Negative for abdominal pain, nausea and vomiting.  per hpi   OBJECTIVE:  Today's Vitals   02/04/19 1512  BP: (!) 164/103  Pulse: 92  Temp: 98.5 F (36.9 C)  TempSrc: Oral  SpO2: 97%  Weight: 185 lb (83.9 kg)  Height: 5\' 5"  (1.651 m)   Body mass index is 30.79 kg/m.  BP Readings from Last 3 Encounters:  02/04/19 (!) 164/103  01/20/19 Marland Kitchen)  155/98  12/24/18 (!) 180/99    Physical Exam Vitals signs and nursing note reviewed.  Constitutional:      Appearance: She is well-developed.  HENT:     Head: Normocephalic and atraumatic.     Mouth/Throat:     Pharynx: No oropharyngeal exudate.  Eyes:     General: No scleral icterus.    Conjunctiva/sclera: Conjunctivae normal.     Pupils: Pupils are equal, round, and reactive to light.  Neck:     Musculoskeletal: Neck supple.  Cardiovascular:     Rate and Rhythm: Normal rate and regular rhythm.     Heart sounds: Normal heart sounds. No murmur. No friction rub. No gallop.   Pulmonary:     Effort: Pulmonary effort is normal.     Breath sounds: Normal breath sounds. No wheezing or rales.  Musculoskeletal:     Comments: Negative tinsel, phalen, elbow flexion test Normal strength, +2 pulses   Lymphadenopathy:     Cervical: No cervical adenopathy.  Skin:    General: Skin is warm and dry.  Neurological:     Mental Status: She is alert and oriented to person, place, and time.    ASSESSMENT and PLAN  1. Megaloblastic anemia - Vitamin B12 - Folate RBC  2. Paresthesia of left arm Increase gabapentin to TID dosing Labs to r/o secondary causes - TSH  3. Essential hypertension Uncontrolled. Increasing amlodipine.  4. Small cell carcinoma of  lower lobe of right lung ([place]) Managed by onc. Declines counseling at this time. Well supported.   5. Neuropathy - gabapentin (NEURONTIN) 300 MG capsule; Take 1 capsule (300 mg total) by mouth 3 (three) times daily.  Other orders - amLODipine (NORVASC) 10 MG tablet; Take 1 tablet (10 mg total) by mouth daily.  Return in about 4 weeks (around 03/04/2019).    Rutherford Guys, MD Primary Care at [place], [place] [address] Ph.  [phone] Fax [phone]

## 2019-02-05 LAB — FOLATE RBC
Folate, Hemolysate: 265 ng/mL
Folate, RBC: 981 ng/mL (ref >498)
Hematocrit: 27 % — ABNORMAL LOW (ref 34.0–46.6)

## 2019-02-05 LAB — VITAMIN B12: Vitamin B-12: 333 pg/mL (ref 232–1245)

## 2019-02-05 LAB — TSH: TSH: 3.5 u[IU]/mL (ref 0.450–4.500)

## 2019-02-07 ENCOUNTER — Ambulatory Visit: Payer: Self-pay

## 2019-02-07 ENCOUNTER — Telehealth: Payer: Self-pay | Admitting: *Deleted

## 2019-02-07 NOTE — Telephone Encounter (Signed)
Pt states she has decided to not schedule CT and wants to stop taking chemotherapy due to all the problems she has been having. Wants to keep her port as her "veins are so bad". Is asking how does she go about getting port flushed her once she stops chemo.   She will discuss with Dr Julien Nordmann at next office visit

## 2019-02-07 NOTE — Telephone Encounter (Signed)
Pt called stating that she was started on a new medication called Norvasc.  She states that Dr Pamella Pert stated that it may cause her ankles to swell. She is calling now to say that her left ankle is slightly swollen. Right ankle no swollen.  There is no pain. She states she has numbness in her extremities from  Chem. She was wanting to know if she should stop the norvasc. She states she is taking the 5mg  and is to increase to 10mg  because of some BP issues. Her BP today 126/77 and 126/72. NT advised her to go to UC or allow scheduling at Saturday clinic. Pt declined both suggestions. She was advised to seek immediate care for increased swelling,pain, discoloration to the extremity.  She states that she will if needed. Note will be routed to office for evaluation on Monday. Care advice read to patient. Patient verbalized understanding.  Reason for Disposition . Swollen ankle joint  (Exception: area of localized swelling which is itchy)  Answer Assessment - Initial Assessment Questions 1. LOCATION: "Which joint is swollen?"    left ankle 2. ONSET: "When did the swelling start?"     today 3. SIZE: "How large is the swelling?"     slight 4. PAIN: "Is there any pain?" If so, ask: "How bad is it?" (Scale 1-10; or mild, moderate, severe)     No 5. CAUSE: "What do you think caused the swollen joint?"     Her medication 6. OTHER SYMPTOMS: "Do you have any other symptoms?" (e.g., fever, chest pain, difficulty breathing, calf pain)     No leg is numb from knee up 7. PREGNANCY: "Is there any chance you are pregnant?" "When was your last menstrual period?"     N/A  Protocols used: ANKLE SWELLING-A-AH

## 2019-02-09 NOTE — Telephone Encounter (Signed)
She is currently off chemo and no plan to start any chemo unless she has disease progression.

## 2019-02-10 NOTE — Telephone Encounter (Signed)
Clarified patient's upcoming appointments. Provided her the number to radiology scheduling to schedule an upcoming scan. Discussed that she has a port flush scheduled for 02/21/2019 and labs.

## 2019-02-11 ENCOUNTER — Telehealth: Payer: Self-pay | Admitting: *Deleted

## 2019-02-11 NOTE — Telephone Encounter (Signed)
Per Cassie Heilingoetter, PA was advised to direct patient to follow up with PCP for further evaluation of discomfort in her thigh area as this would not be related to her previous treatment with chemo.  Advised patient that if she felt it still warranted further evaluation then she could come in and have the issue evaluated in person.    Relayed message to patient.  She declined having it evaluated and had no further requests.

## 2019-02-11 NOTE — Telephone Encounter (Signed)
Please let patient know that I do NOT want her to stop norvasc and I want her to keep at 5mg  daily as her BP at home are at goal. She is having minimal swelling, which sounds to be benign, that might be caused for many other reasons. Only reason to stop amlodipine would be for significant swelling. Thanks

## 2019-02-11 NOTE — Telephone Encounter (Signed)
Pt is asking for pain meds due to the pain she is in from taking the norvasc. Causing numbness and swelling  As well

## 2019-02-11 NOTE — Telephone Encounter (Signed)
Patient called to request something for the "pain" she is having in her left hand and right thigh.  She states it is a side effect of her treatment.  Patient is not currently being treated as of right now and is under observation.  She states that Dr Earlie Server prescribed Percocet for her port placement pain and she tried one of them but states it wasn't effective.  She also states that her PCP increased the Gabapentin 300 mg to TIB.  Upon further questioning she states that the pain is a "numbness" and if she touches the areas that are numb they hurt.  She also reports insomnia and asked if she could get guidance from pharmacist to recommend something to help her sleep.  Advised would route this to [employer], PA.

## 2019-02-12 ENCOUNTER — Other Ambulatory Visit: Payer: Self-pay | Admitting: Physician Assistant

## 2019-02-12 ENCOUNTER — Telehealth: Payer: Self-pay | Admitting: Family Medicine

## 2019-02-12 ENCOUNTER — Encounter: Payer: Self-pay | Admitting: Medical

## 2019-02-12 ENCOUNTER — Telehealth: Payer: Self-pay | Admitting: Physician Assistant

## 2019-02-12 DIAGNOSIS — G629 Polyneuropathy, unspecified: Secondary | ICD-10-CM

## 2019-02-12 DIAGNOSIS — C3431 Malignant neoplasm of lower lobe, right bronchus or lung: Secondary | ICD-10-CM

## 2019-02-12 NOTE — Telephone Encounter (Signed)
Patient's concern/request has been addressed already.

## 2019-02-12 NOTE — Telephone Encounter (Signed)
Spoke to the patient about her concerns. Spoke to her that our recommendation is to get her CT scan performed to further monitor her condition for surveillance. Clarified that we are not planning on considering any further chemotherapy unless there is evidence of disease progression on her scan and then we would discuss further recommendations if needed. Also recommended keeping her port-a-cath. We will arrange for port flushes as needed. She has a port-flush and lab appointment on 02/21/2019. She states she will reschedule her scan. She knows to call us sooner if she needs her labs/flush appointment sooner to coincide with her new scheduled scan time.  She will return on August 10th, 2020 to review her scans and for evaluation.   Regarding her other medical concerns, she was recently physically evaluated by her primary physician who outlined recommendations for her medical concerns for her varicose vein and to increase her gabapentin. I encouraged the patient to take her medications as prescribed to see if she has improvement in her symptoms. I encouraged her to try these interventions. If no further improvement in her symptoms, then she knows to scheduled a follow up appointment. We will see her back in a week and a half and will evaluate whether she has improvement in her symptoms at that time. She knows that we are available sooner if necessary. She was agreeable to the outlined plan. All questions answered.

## 2019-02-12 NOTE — Telephone Encounter (Signed)
She should discuss timing of port removal and second opinions with her oncologist.  I do not remove ports. She could probably see oncology at [facility] or [facility]. Is the gabapentin helping at all? If she is tolerating it well, then she can increase slowly to 600mg  TID. We can also start duloxetine for her neuropathic pain from her chemotherapy. Please have her schedule an appointment with me after she sees oncology on August 7th. thanks

## 2019-02-12 NOTE — Telephone Encounter (Signed)
Pt called and stated that she would like to know when she decides to have port removed if Dr Pamella Pert can do that. Please advise

## 2019-02-12 NOTE — Telephone Encounter (Signed)
Spoke with [name] and she is very concerned about her treatment with the oncology she has been seeing. [name] state she has been in pain and need something to help. She states they have been giving her the run around about her treatment and she is very concerned. [name] states she need another referral to a different oncology. Leg pain is at a 10 at this time.  Need to know where she can go to get her port removed? Please advise

## 2019-02-13 ENCOUNTER — Telehealth: Payer: Self-pay | Admitting: *Deleted

## 2019-02-13 NOTE — Telephone Encounter (Signed)
Patient called to advise that she got her CT scheduled along with lab/flush.  She also questioned the MRI that was done on 07/06 when she had sudden acute changes.  She states that [employer] is saying we failed to get prior authorization.    I routed the inquiry to Gaspar Bidding who handles our prior authorizations.  This was ordered as a STAT exam.  Pending response from her.  Advised patient that this was being looked into.

## 2019-02-17 ENCOUNTER — Telehealth: Payer: Self-pay | Admitting: Family Medicine

## 2019-02-17 ENCOUNTER — Other Ambulatory Visit: Payer: Self-pay | Admitting: Family Medicine

## 2019-02-17 NOTE — Telephone Encounter (Signed)
Requested Prescriptions  Pending Prescriptions Disp Refills  . lisinopril (ZESTRIL) 40 MG tablet [Pharmacy Med Name: LISINOPRIL 40 MG TABLET] 90 tablet 0    Sig: TAKE 1 TABLET BY MOUTH EVERY DAY     Cardiovascular:  ACE Inhibitors Failed - 02/17/2019  1:28 AM      Failed - Cr in normal range and within 180 days    Creatinine  Date Value Ref Range Status  12/31/2018 1.76 (H) 0.44 - 1.00 mg/dL Final         Failed - Last BP in normal range    BP Readings from Last 1 Encounters:  02/04/19 (!) 164/103         Passed - K in normal range and within 180 days    Potassium  Date Value Ref Range Status  12/31/2018 4.5 3.5 - 5.1 mmol/L Final         Passed - Patient is not pregnant      Passed - Valid encounter within last 6 months    Recent Outpatient Visits          1 week ago Megaloblastic anemia   [facility], Lilia Argue, MD   6 months ago Essential hypertension   [facility], Lilia Argue, MD   8 months ago Pain in joint of left shoulder   [facility], Laurey Arrow, MD   1 year ago Skin lesion of right leg   [facility] [name], Malka So, MD   3 years ago Abscess of right thigh   [name], Malka So, MD      Future Appointments            In 2 weeks Rutherford Guys, MD [facility] [place], [facility]

## 2019-02-17 NOTE — Telephone Encounter (Signed)
Pt in severe pain. Leg numbing/pain. Pt requesting Gabapentin 300 mg

## 2019-02-19 ENCOUNTER — Other Ambulatory Visit: Payer: Self-pay | Admitting: Physician Assistant

## 2019-02-19 DIAGNOSIS — C3431 Malignant neoplasm of lower lobe, right bronchus or lung: Secondary | ICD-10-CM

## 2019-02-20 ENCOUNTER — Ambulatory Visit (HOSPITAL_COMMUNITY): Payer: Medicare Other

## 2019-02-21 ENCOUNTER — Inpatient Hospital Stay: Payer: Medicare Other | Attending: Internal Medicine

## 2019-02-21 ENCOUNTER — Other Ambulatory Visit: Payer: Medicare Other

## 2019-02-21 ENCOUNTER — Ambulatory Visit (HOSPITAL_COMMUNITY)
Admission: RE | Admit: 2019-02-21 | Discharge: 2019-02-21 | Disposition: A | Discharge status: Home / Self Care | Payer: Medicare Other | Source: Ambulatory Visit | Attending: Physician Assistant | Admitting: Physician Assistant

## 2019-02-21 ENCOUNTER — Inpatient Hospital Stay: Payer: Medicare Other

## 2019-02-21 ENCOUNTER — Other Ambulatory Visit: Payer: Self-pay

## 2019-02-21 DIAGNOSIS — M1990 Unspecified osteoarthritis, unspecified site: Secondary | ICD-10-CM | POA: Insufficient documentation

## 2019-02-21 DIAGNOSIS — C3431 Malignant neoplasm of lower lobe, right bronchus or lung: Secondary | ICD-10-CM | POA: Diagnosis present

## 2019-02-21 DIAGNOSIS — G629 Polyneuropathy, unspecified: Secondary | ICD-10-CM | POA: Insufficient documentation

## 2019-02-21 DIAGNOSIS — R531 Weakness: Secondary | ICD-10-CM | POA: Diagnosis not present

## 2019-02-21 DIAGNOSIS — Z8673 Personal history of transient ischemic attack (TIA), and cerebral infarction without residual deficits: Secondary | ICD-10-CM | POA: Diagnosis not present

## 2019-02-21 DIAGNOSIS — R5383 Other fatigue: Secondary | ICD-10-CM | POA: Insufficient documentation

## 2019-02-21 DIAGNOSIS — E785 Hyperlipidemia, unspecified: Secondary | ICD-10-CM | POA: Insufficient documentation

## 2019-02-21 DIAGNOSIS — Z9221 Personal history of antineoplastic chemotherapy: Secondary | ICD-10-CM | POA: Diagnosis not present

## 2019-02-21 DIAGNOSIS — Z923 Personal history of irradiation: Secondary | ICD-10-CM | POA: Diagnosis not present

## 2019-02-21 DIAGNOSIS — N183 Chronic kidney disease, stage 3 (moderate): Secondary | ICD-10-CM

## 2019-02-21 DIAGNOSIS — I10 Essential (primary) hypertension: Secondary | ICD-10-CM | POA: Diagnosis not present

## 2019-02-21 DIAGNOSIS — N179 Acute kidney failure, unspecified: Secondary | ICD-10-CM

## 2019-02-21 DIAGNOSIS — N289 Disorder of kidney and ureter, unspecified: Secondary | ICD-10-CM | POA: Diagnosis not present

## 2019-02-21 DIAGNOSIS — Z79899 Other long term (current) drug therapy: Secondary | ICD-10-CM | POA: Diagnosis not present

## 2019-02-21 LAB — MAGNESIUM: Magnesium: 2.1 mg/dL (ref 1.7–2.4)

## 2019-02-21 LAB — CBC WITH DIFFERENTIAL (CANCER CENTER ONLY)
Abs Immature Granulocytes: 0.01 10*3/uL (ref 0.00–0.07)
Basophils Absolute: 0 10*3/uL (ref 0.0–0.1)
Basophils Relative: 1 %
Eosinophils Absolute: 0.2 10*3/uL (ref 0.0–0.5)
Eosinophils Relative: 3 %
HCT: 25.6 % — ABNORMAL LOW (ref 36.0–46.0)
Hemoglobin: 8.2 g/dL — ABNORMAL LOW (ref 12.0–15.0)
Immature Granulocytes: 0 %
Lymphocytes Relative: 8 %
Lymphs Abs: 0.4 10*3/uL — ABNORMAL LOW (ref 0.7–4.0)
MCH: 35.7 pg — ABNORMAL HIGH (ref 26.0–34.0)
MCHC: 32 g/dL (ref 30.0–36.0)
MCV: 111.3 fL — ABNORMAL HIGH (ref 80.0–100.0)
Monocytes Absolute: 0.5 10*3/uL (ref 0.1–1.0)
Monocytes Relative: 10 %
Neutro Abs: 3.8 10*3/uL (ref 1.7–7.7)
Neutrophils Relative %: 78 %
Platelet Count: 132 10*3/uL — ABNORMAL LOW (ref 150–400)
RBC: 2.3 MIL/uL — ABNORMAL LOW (ref 3.87–5.11)
RDW: 17.7 % — ABNORMAL HIGH (ref 11.5–15.5)
WBC Count: 4.9 10*3/uL (ref 4.0–10.5)
nRBC: 0 % (ref 0.0–0.2)

## 2019-02-21 LAB — CMP (CANCER CENTER ONLY)
ALT: 10 U/L (ref 0–44)
AST: 16 U/L (ref 15–41)
Albumin: 3.6 g/dL (ref 3.5–5.0)
Alkaline Phosphatase: 87 U/L (ref 38–126)
Anion gap: 13 (ref 5–15)
BUN: 21 mg/dL (ref 8–23)
CO2: 21 mmol/L — ABNORMAL LOW (ref 22–32)
Calcium: 9 mg/dL (ref 8.9–10.3)
Chloride: 106 mmol/L (ref 98–111)
Creatinine: 1.68 mg/dL — ABNORMAL HIGH (ref 0.44–1.00)
GFR, Est AFR Am: 36 mL/min — ABNORMAL LOW (ref >60)
GFR, Estimated: 31 mL/min — ABNORMAL LOW (ref >60)
Glucose, Bld: 101 mg/dL — ABNORMAL HIGH (ref 70–99)
Potassium: 4.7 mmol/L (ref 3.5–5.1)
Sodium: 140 mmol/L (ref 135–145)
Total Bilirubin: 0.3 mg/dL (ref 0.3–1.2)
Total Protein: 6.4 g/dL — ABNORMAL LOW (ref 6.5–8.1)

## 2019-02-21 MED ORDER — HEPARIN SOD (PORK) LOCK FLUSH 100 UNIT/ML IV SOLN
INTRAVENOUS | Status: AC
  Filled 2019-02-21: qty 5

## 2019-02-21 MED ORDER — GABAPENTIN 300 MG PO CAPS: ORAL_CAPSULE | ORAL | 0 refills | Status: DC

## 2019-02-21 MED ORDER — SODIUM CHLORIDE 0.9% FLUSH
10.0000 mL | Freq: Once | INTRAVENOUS | Status: AC | PRN
  Administered 2019-02-21: 10 mL
  Filled 2019-02-21: qty 10

## 2019-02-21 MED ORDER — HEPARIN SOD (PORK) LOCK FLUSH 100 UNIT/ML IV SOLN
500.0000 [IU] | Freq: Once | INTRAVENOUS | Status: AC
  Administered 2019-02-21: 500 [IU] via INTRAVENOUS

## 2019-02-21 NOTE — Telephone Encounter (Signed)
Could you please send medication for pain into pharmacy and I will call her with an update

## 2019-02-21 NOTE — Telephone Encounter (Signed)
I sent in a new rx for gabapentin, 300mg  BID and 600mg  bedtime, we can discuss further at upcoming appt. thanks

## 2019-02-21 NOTE — Addendum Note (Signed)
Addended by: Rutherford Guys on: 02/21/2019 07:52 PM   Modules accepted: Orders

## 2019-02-22 ENCOUNTER — Encounter: Payer: Self-pay | Admitting: Internal Medicine

## 2019-02-24 ENCOUNTER — Other Ambulatory Visit: Payer: Self-pay

## 2019-02-24 ENCOUNTER — Encounter: Payer: Self-pay | Admitting: Internal Medicine

## 2019-02-24 ENCOUNTER — Inpatient Hospital Stay (HOSPITAL_BASED_OUTPATIENT_CLINIC_OR_DEPARTMENT_OTHER): Payer: Medicare Other | Admitting: Internal Medicine

## 2019-02-24 VITALS — BP 135/74 | HR 94 | Temp 98.7°F | Resp 18 | Ht 65.0 in | Wt 181.3 lb

## 2019-02-24 DIAGNOSIS — I10 Essential (primary) hypertension: Secondary | ICD-10-CM

## 2019-02-24 DIAGNOSIS — Z5111 Encounter for antineoplastic chemotherapy: Secondary | ICD-10-CM | POA: Diagnosis not present

## 2019-02-24 DIAGNOSIS — C3431 Malignant neoplasm of lower lobe, right bronchus or lung: Secondary | ICD-10-CM

## 2019-02-24 NOTE — Progress Notes (Signed)
[place] [phone]   [phone]  OFFICE PROGRESS NOTE  Rutherford Guys, MD [address] [name] [address]  DIAGNOSIS: Limited stage (T2b, N2, M0) small cell lung cancer, pending further staging work-up diagnosed and January 2020.  She presented with right lower lobe lung mass in addition to right hilar and mediastinal lymphadenopathy.  PRIOR THERAPY: Systemic chemotherapy concurrent with radiation.  Chemotherapy withcisplatin 80 mg/M2 on day 1 and etoposide 100 mg/M2 on days 1, 2 and 3 every 3 weeks.First dose September 03, 2018. Status post5cycles.Dose reducedstarting fromcycle #4to cisplatin 40 mg/m2 and etoposide 80 mg/m2 due to renal insufficiency.  CURRENT THERAPY: Observation.  INTERVAL HISTORY: [name] 68 y.o. female returns to the clinic today for follow-up visit.  The patient is feeling fine today with no concerning complaints.  She denied having any chest pain, shortness of breath, cough or hemoptysis.  She continues to have peripheral neuropathy and she is currently on gabapentin 300 mg p.o. 3 times daily.  She has no weight loss or night sweats.  She has no nausea, vomiting, diarrhea or constipation.  She has no headache or visual changes.  She is currently on observation after completing a course of chemotherapy concurrent with radiation.  She had repeat CT scan of the chest performed recently and she is here for evaluation and discussion of her scan results.  MEDICAL HISTORY: Past Medical History:  Diagnosis Date   Arthritis    Chronic pain    Hyperlipidemia    Hypertension    Low blood magnesium 10/04/2018   lung ca dx'd 06/2018   Stroke ([place])    06/19/2018 minor    ALLERGIES:  has No Known Allergies.  MEDICATIONS:  Current Outpatient Medications  Medication Sig Dispense Refill   acetaminophen (TYLENOL) 650 MG CR tablet Take 650 mg by mouth every 8 (eight) hours as needed for pain.      amLODipine (NORVASC) 10 MG tablet Take 1 tablet (10 mg total) by mouth daily. 30 tablet 3   atorvastatin (LIPITOR) 80 MG tablet Take 1 tablet (80 mg total) by mouth daily at 6 PM. 90 tablet 3   feeding supplement, ENSURE ENLIVE, (ENSURE ENLIVE) LIQD Take 237 mLs by mouth 2 (two) times daily between meals. 60 Bottle 0   ferrous sulfate 325 (65 FE) MG tablet Take 325 mg by mouth daily with breakfast.     gabapentin (NEURONTIN) 300 MG capsule Take 1 capsule (300 mg total) by mouth 2 (two) times daily AND 2 capsules (600 mg total) at bedtime. 120 capsule 0   lidocaine-prilocaine (EMLA) cream Apply 1 application topically as needed. 30 g 0   lisinopril (ZESTRIL) 40 MG tablet TAKE 1 TABLET BY MOUTH EVERY DAY 90 tablet 0   magnesium oxide (MAG-OX) 400 MG tablet TAKE 1 TABLET BY MOUTH THREE TIMES A DAY 90 tablet 0   pantoprazole (PROTONIX) 40 MG tablet TAKE 1 TABLET (40 MG TOTAL) BY MOUTH 2 (TWO) TIMES DAILY BEFORE A MEAL. 180 tablet 1   potassium chloride SA (K-DUR) 20 MEQ tablet Take 1 tablet (20 mEq total) by mouth daily. 5 tablet 0   No current facility-administered medications for this visit.     SURGICAL HISTORY:  Past Surgical History:  Procedure Laterality Date   BIOPSY  06/19/2018   Procedure: BIOPSY;  Surgeon: Laurence Spates, MD;  Location: [facility];  Service: Endoscopy;;   BIOPSY  10/11/2018   Procedure: BIOPSY;  Surgeon: Laurence Spates,  MD;  Location: [facility];  Service: Endoscopy;;   ENDARTERECTOMY Right 06/26/2018   Procedure: ENDARTERECTOMY CAROTID RIGHT;  Surgeon: Serafina Mitchell, MD;  [facility];  Service: Vascular;  Laterality: Right;   ESOPHAGOGASTRODUODENOSCOPY (EGD) WITH PROPOFOL N/A 06/19/2018   Procedure: ESOPHAGOGASTRODUODENOSCOPY (EGD) WITH PROPOFOL;  Surgeon: Laurence Spates, MD;  Location: [facility];  Service: Endoscopy;  Laterality: N/A;   FLEXIBLE SIGMOIDOSCOPY N/A 10/11/2018   Procedure: FLEXIBLE SIGMOIDOSCOPY;  Surgeon: Laurence Spates, MD;   Location: [facility];  Service: Endoscopy;  Laterality: N/A;   IR IMAGING GUIDED PORT INSERTION  10/07/2018   PATCH ANGIOPLASTY Right 06/26/2018   Procedure: PATCH ANGIOPLASTY USING A XENOSURE 1cm x 6cm BIOLOGIC PATCH;  Surgeon: Serafina Mitchell, MD;  Location: [facility] OR;  Service: Vascular;  Laterality: Right;   VIDEO BRONCHOSCOPY WITH ENDOBRONCHIAL ULTRASOUND N/A 08/14/2018   Procedure: VIDEO BRONCHOSCOPY WITH ENDOBRONCHIAL ULTRASOUND;  Surgeon: Collene Gobble, MD;  Location: [facility] OR;  Service: Thoracic;  Laterality: N/A;    REVIEW OF SYSTEMS:  A comprehensive review of systems was negative except for: Constitutional: positive for fatigue Neurological: positive for paresthesia and weakness   PHYSICAL EXAMINATION: General appearance: alert, cooperative, fatigued and no distress Head: Normocephalic, without obvious abnormality, atraumatic Neck: no adenopathy, no JVD, supple, symmetrical, trachea midline and thyroid not enlarged, symmetric, no tenderness/mass/nodules Lymph nodes: Cervical, supraclavicular, and axillary nodes normal. Resp: clear to auscultation bilaterally Back: symmetric, no curvature. ROM normal. No CVA tenderness. Cardio: regular rate and rhythm, S1, S2 normal, no murmur, click, rub or gallop GI: soft, non-tender; bowel sounds normal; no masses,  no organomegaly Extremities: extremities normal, atraumatic, no cyanosis or edema  ECOG PERFORMANCE STATUS: 1 - Symptomatic but completely ambulatory  Blood pressure 135/74, pulse 94, temperature 98.7 F (37.1 C), temperature source Oral, resp. rate 18, height 5\' 5"  (1.651 m), weight 181 lb 4.8 oz (82.2 kg), SpO2 96 %.  LABORATORY DATA: Lab Results  Component Value Date   WBC 4.9 02/21/2019   HGB 8.2 (L) 02/21/2019   HCT 25.6 (L) 02/21/2019   MCV 111.3 (H) 02/21/2019   PLT 132 (L) 02/21/2019      Chemistry      Component Value Date/Time   NA 140 02/21/2019 0731   NA 142 08/02/2018 1042   K 4.7 02/21/2019 0731   CL  106 02/21/2019 0731   CO2 21 (L) 02/21/2019 0731   BUN 21 02/21/2019 0731   BUN 9 08/02/2018 1042   CREATININE 1.68 (H) 02/21/2019 0731      Component Value Date/Time   CALCIUM 9.0 02/21/2019 0731   ALKPHOS 87 02/21/2019 0731   AST 16 02/21/2019 0731   ALT 10 02/21/2019 0731   BILITOT 0.3 02/21/2019 0731       RADIOGRAPHIC STUDIES: Ct Chest Wo Contrast  Result Date: 02/21/2019 CLINICAL DATA:  Restaging lung cancer, s/p chemo and XRT EXAM: CT CHEST WITHOUT CONTRAST TECHNIQUE: Multidetector CT imaging of the chest was performed following the standard protocol without IV contrast. COMPARISON:  11/21/2018, 10/11/2018 FINDINGS: Cardiovascular: Aortic atherosclerosis with redemonstrated the aneurysmal dilatation of the mid to distal arch near the level of the isthmus measuring 3.7 cm. Normal heart size. Three-vessel coronary artery calcifications. No pericardial effusion. Right chest port catheter. Mediastinum/Nodes: No change in an enlarged pretracheal lymph node or mass measuring 4.1 x 3.1 cm (series 2, image 46). Unchanged right hilar and subcarinal lymph nodes, largest subcarinal node measuring 1.6 cm. Thyroid gland, trachea, and esophagus demonstrate no significant findings. Lungs/Pleura: New  small right pleural effusion with associated atelectasis or consolidation. Interval decrease in size of a primary mass of the right lower lobe measuring 2.6 x 2.3 cm, with increased adjacent and distal consolidation or atelectasis (series 7, image 85). There is developing consolidation of the paramedian right upper lobe with adjacent scattered ground-glass opacities. Upper Abdomen: No acute abnormality. Unchanged small left adrenal nodules, likely benign. Musculoskeletal: No chest wall mass or suspicious bone lesions identified. IMPRESSION: 1. Interval decrease in size of a primary mass of the right lower lobe measuring 2.6 x 2.3 cm, with increased adjacent and distal consolidation or atelectasis (series 7,  image 85). There is developing atelectasis of the paramedian right upper lobe with adjacent scattered ground-glass opacities. Findings are consistent with treatment response and developing post treatment/post radiation change. Attention on follow-up. 2. New small right pleural effusion with associated atelectasis or consolidation, possibly reactive. No obvious pleural nodularity or other finding to suggest malignant effusion. 3. No change in an enlarged pretracheal lymph node or mass measuring 4.1 x 3.1 cm (series 2, image 46). Unchanged right hilar and subcarinal lymph nodes, largest subcarinal node measuring 1.6 cm. 4.  Coronary artery disease. 5. Aortic atherosclerosis with redemonstrated the aneurysmal dilatation of the mid to distal arch near the level of the isthmus measuring 3.7 cm. 6. Unchanged small left adrenal nodules, likely benign. Electronically Signed   By: Eddie Candle M.D.   On: 02/21/2019 10:25    ASSESSMENT AND PLAN: This is a very pleasant 68 years old white female with limited stage small cell lung cancer diagnosed in January 2020 status post systemic chemotherapy initially with cisplatin and etoposide status post 5 cycles.  Her dose of cisplatin and etoposide was significantly reduced starting from cycle #4 secondary to worsening renal insufficiency.  Her treatment was concurrent with radiotherapy. The patient has partial response to this treatment. She is currently on observation and feeling fine except for the peripheral neuropathy and renal insufficiency. She had repeat CT scan of the chest performed recently.  I personally and independently reviewed the scans and discussed the results with the patient today. Her scan showed no concerning findings for disease progression. I recommended for the patient to continue on observation with repeat CT scan of the chest in 3 months. For the peripheral neuropathy, I recommended for the patient to continue her current treatment with  gabapentin. We will also arrange for the patient to have Port-A-Cath flush every 6 weeks. She was advised to call immediately if she has any concerning symptoms in the interval. The patient voices understanding of current disease status and treatment options and is in agreement with the current care plan.  All questions were answered. The patient knows to call the clinic with any problems, questions or concerns. We can certainly see the patient much sooner if necessary.  I spent 10 minutes counseling the patient face to face. The total time spent in the appointment was 15 minutes.  Disclaimer: This note was dictated with voice recognition software. Similar sounding words can inadvertently be transcribed and may not be corrected upon review.

## 2019-02-25 ENCOUNTER — Telehealth: Payer: Self-pay | Admitting: Internal Medicine

## 2019-02-25 NOTE — Telephone Encounter (Signed)
Per 8/10 los - Scheduled appt - pt is aware of appt date and time - [facility] radiology to contact pt .

## 2019-03-04 NOTE — Telephone Encounter (Signed)
Pt has appointment tomorrow with provider.

## 2019-03-06 ENCOUNTER — Ambulatory Visit (INDEPENDENT_AMBULATORY_CARE_PROVIDER_SITE_OTHER): Payer: Medicare Other | Admitting: Family Medicine

## 2019-03-06 ENCOUNTER — Encounter: Payer: Self-pay | Admitting: Family Medicine

## 2019-03-06 ENCOUNTER — Other Ambulatory Visit: Payer: Self-pay

## 2019-03-06 VITALS — BP 142/80 | HR 77 | Temp 97.9°F | Ht 66.0 in | Wt 179.4 lb

## 2019-03-06 DIAGNOSIS — C3431 Malignant neoplasm of lower lobe, right bronchus or lung: Secondary | ICD-10-CM

## 2019-03-06 DIAGNOSIS — D531 Other megaloblastic anemias, not elsewhere classified: Secondary | ICD-10-CM | POA: Insufficient documentation

## 2019-03-06 DIAGNOSIS — I10 Essential (primary) hypertension: Secondary | ICD-10-CM

## 2019-03-06 DIAGNOSIS — R51 Headache: Secondary | ICD-10-CM | POA: Diagnosis not present

## 2019-03-06 DIAGNOSIS — N183 Chronic kidney disease, stage 3 unspecified: Secondary | ICD-10-CM | POA: Insufficient documentation

## 2019-03-06 DIAGNOSIS — R519 Headache, unspecified: Secondary | ICD-10-CM

## 2019-03-06 DIAGNOSIS — G629 Polyneuropathy, unspecified: Secondary | ICD-10-CM | POA: Diagnosis not present

## 2019-03-06 MED ORDER — PREGABALIN 50 MG PO CAPS: 50.0000 mg | ORAL_CAPSULE | Freq: Three times a day (TID) | ORAL | 3 refills | Status: DC

## 2019-03-06 NOTE — Patient Instructions (Addendum)
Vitamin B12 100 mcg  A day  capsacian - over the counter for neuropathic pain  If you have lab work done today you will be contacted with your lab results within the next 2 weeks.  If you have not heard from [place] then please contact us. The fastest way to get your results is to register for My Chart.   IF you received an x-ray today, you will receive an invoice from [facility]. Please contact [facility] at [phone] with questions or concerns regarding your invoice.   IF you received labwork today, you will receive an invoice from [facility]. Please contact [facility] at [phone] with questions or concerns regarding your invoice.   Our billing staff will not be able to assist you with questions regarding bills from these companies.  You will be contacted with the lab results as soon as they are available. The fastest way to get your results is to activate your My Chart account. Instructions are located on the last page of this paperwork. If you have not heard from [place] regarding the results in 2 weeks, please contact this office.

## 2019-03-06 NOTE — Progress Notes (Signed)
8/20/20209:36 AM  [name] 1950-08-24, 68 y.o., female [id number]  Chief Complaint  Patient presents with   Leg Pain    right leg pain from the knee up. Does radiation, thinks it may have something to do with that .Also has numbness in hands an feet. Dec 2019 mri was done    HPI:   Patient is a 68 y.o. female with past medical history significant for HTN, CKD3, CVA in 06/2018, HLP, lung cancer, UGIB with h/o transfusion, psoriasis, who presents today for routine followup  Last OV July 2020 Increased amlodipine and gabapentin Started on OTC b12 supplement  Checks BP at home Tolerating increase in medication well Bps at home 120/70s  Did not tolerate increase in gabapentin Made her very dizzy  Has started vitamin B12 supplements  Wondering if she cant take excederin prn for headaches as she takes full dose ASA daily She gets a headache about twice a month  Continues to be very angry at having cancer She declines counseling  She declines any further preventive services  She has never taken potassium supplement  Saw onc a week ago - most recent ct scan showed no signs of progression, followup in 3 months  Lab Results  Component Value Date   WBC 4.9 02/21/2019   HGB 8.2 (L) 02/21/2019   HCT 25.6 (L) 02/21/2019   MCV 111.3 (H) 02/21/2019   PLT 132 (L) 02/21/2019   Lab Results  Component Value Date   CREATININE 1.68 (H) 02/21/2019   BUN 21 02/21/2019   NA 140 02/21/2019   K 4.7 02/21/2019   CL 106 02/21/2019   CO2 21 (L) 02/21/2019    Depression screen PHQ 2/9 06/21/2018  Decreased Interest 0  Down, Depressed, Hopeless 0  PHQ - 2 Score 0    Fall Risk  02/04/2019 02/04/2019 08/02/2018 06/21/2018  Falls in the past year? 0 0 1 0  Number falls in past yr: 0 0 0 -  Injury with Fall? 0 0 0 -     No Known Allergies  Prior to Admission medications   Medication Sig Start Date End Date Taking? Authorizing Provider  acetaminophen (TYLENOL) 650 MG CR  tablet Take 650 mg by mouth every 8 (eight) hours as needed for pain.    [provider]  amLODipine (NORVASC) 10 MG tablet Take 1 tablet (10 mg total) by mouth daily. 02/04/19   Rutherford Guys, MD  atorvastatin (LIPITOR) 80 MG tablet Take 1 tablet (80 mg total) by mouth daily at 6 PM. 08/06/18   Rutherford Guys, MD  feeding supplement, ENSURE ENLIVE, (ENSURE ENLIVE) LIQD Take 237 mLs by mouth 2 (two) times daily between meals. 11/03/18   Mikhail, Velta Addison, DO  ferrous sulfate 325 (65 FE) MG tablet Take 325 mg by mouth daily with breakfast.    [provider]  gabapentin (NEURONTIN) 300 MG capsule Take 1 capsule (300 mg total) by mouth 2 (two) times daily AND 2 capsules (600 mg total) at bedtime. 02/21/19 03/23/19  Rutherford Guys, MD  lidocaine-prilocaine (EMLA) cream Apply 1 application topically as needed. 10/08/18   Curt Bears, MD  lisinopril (ZESTRIL) 40 MG tablet TAKE 1 TABLET BY MOUTH EVERY DAY 02/17/19   Rutherford Guys, MD  magnesium oxide (MAG-OX) 400 MG tablet TAKE 1 TABLET BY MOUTH THREE TIMES A DAY 01/15/19   Heilingoetter, Cassandra L, PA-C  pantoprazole (PROTONIX) 40 MG tablet TAKE 1 TABLET (40 MG TOTAL) BY MOUTH 2 (TWO) TIMES DAILY  BEFORE A MEAL. 10/16/18   Curt Bears, MD  potassium chloride SA (K-DUR) 20 MEQ tablet Take 1 tablet (20 mEq total) by mouth daily. 11/03/18   Cristal Ford, DO    Past Medical History:  Diagnosis Date   Arthritis    Chronic pain    Hyperlipidemia    Hypertension    Low blood magnesium 10/04/2018   lung ca dx'd 06/2018   Stroke ([place])    06/19/2018 minor    Past Surgical History:  Procedure Laterality Date   BIOPSY  06/19/2018   Procedure: BIOPSY;  Surgeon: Laurence Spates, MD;  Location: [facility];  Service: Endoscopy;;   BIOPSY  10/11/2018   Procedure: BIOPSY;  Surgeon: Laurence Spates, MD;  Location: [facility];  Service: Endoscopy;;   ENDARTERECTOMY Right 06/26/2018   Procedure: ENDARTERECTOMY CAROTID RIGHT;   Surgeon: Serafina Mitchell, MD;  [facility];  Service: Vascular;  Laterality: Right;   ESOPHAGOGASTRODUODENOSCOPY (EGD) WITH PROPOFOL N/A 06/19/2018   Procedure: ESOPHAGOGASTRODUODENOSCOPY (EGD) WITH PROPOFOL;  Surgeon: Laurence Spates, MD;  [facility]: [facility];  Service: Endoscopy;  Laterality: N/A;   FLEXIBLE SIGMOIDOSCOPY N/A 10/11/2018   Procedure: FLEXIBLE SIGMOIDOSCOPY;  Surgeon: Laurence Spates, MD;  [facility];  Service: Endoscopy;  Laterality: N/A;   IR IMAGING GUIDED PORT INSERTION  10/07/2018   PATCH ANGIOPLASTY Right 06/26/2018   Procedure: PATCH ANGIOPLASTY USING A XENOSURE 1cm x 6cm BIOLOGIC PATCH;  Surgeon: Serafina Mitchell, MD;  [facility];  Service: Vascular;  Laterality: Right;   VIDEO BRONCHOSCOPY WITH ENDOBRONCHIAL ULTRASOUND N/A 08/14/2018   Procedure: VIDEO BRONCHOSCOPY WITH ENDOBRONCHIAL ULTRASOUND;  Surgeon: Collene Gobble, MD;  [facility];  Service: Thoracic;  Laterality: N/A;    Social History   Tobacco Use   Smoking status: Former Smoker    Packs/day: 1.00    Years: 10.00    Pack years: 10.00    Types: Cigarettes    Quit date: 06/19/2018    Years since quitting: 0.7   Smokeless tobacco: Never Used   Tobacco comment: Currently using Nicorette Gum  Substance Use Topics   Alcohol use: Yes    Comment: wine 2 glasses a day     Family History  Problem Relation Age of Onset   Cancer Mother        lung cancer   Diabetes Other    Hyperlipidemia Other    Hypertension Other    Cancer Other     Review of Systems  Constitutional: Positive for malaise/fatigue. Negative for chills and fever.  Respiratory: Negative for cough and shortness of breath.   Cardiovascular: Negative for chest pain, palpitations and leg swelling.  Gastrointestinal: Negative for abdominal pain, nausea and vomiting.     OBJECTIVE:  Today's Vitals   03/06/19 0935  BP: (!) 142/80  Pulse: 77  Temp: 97.9 F (36.6 C)  SpO2: 95%  Weight: 179 lb  6.4 oz (81.4 kg)  Height: 5\' 6"  (1.676 m)   Body mass index is 28.96 kg/m.   Physical Exam Vitals signs and nursing note reviewed.  Constitutional:      Appearance: She is well-developed.  HENT:     Head: Normocephalic and atraumatic.     Mouth/Throat:     Pharynx: No oropharyngeal exudate.  Eyes:     General: No scleral icterus.    Conjunctiva/sclera: Conjunctivae normal.     Pupils: Pupils are equal, round, and reactive to light.  Neck:     Musculoskeletal: Neck supple.  Cardiovascular:  Rate and Rhythm: Normal rate and regular rhythm.     Heart sounds: Normal heart sounds. No murmur. No friction rub. No gallop.   Pulmonary:     Effort: Pulmonary effort is normal.     Breath sounds: Normal breath sounds. No wheezing or rales.  Skin:    General: Skin is warm and dry.  Neurological:     Mental Status: She is alert and oriented to person, place, and time.     No results found for this or any previous visit (from the past 24 hour(s)).  No results found.   ASSESSMENT and PLAN  1. Essential hypertension Controlled. Continue current regime.   2. 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 Did not tolerate gabapentin, trial of lyrica. Reviewed r/se/b. Discussed titration and OTC caspacian.  3. Megaloblastic anemia Started b12 oral supplementation.  4. Nonintractable episodic headache, unspecified headache type Trial of OTC APAP. Ok to take excedrin sparingly.  5. Small cell carcinoma of lower lobe of right lung ([place]) Recent scan stable. Managed by onc Declines counseling  6. CKD (chronic kidney disease) stage 3, GFR 30-59 ml/min (HCC) Stable. 2/2 to chemo.  Other orders - pregabalin (LYRICA) 50 MG capsule; Take 1 capsule (50 mg total) by mouth 3 (three) times daily.  Return in about 4 weeks (around 04/03/2019).    Rutherford Guys, MD Primary Care at [facility] [place], [place] [address] Ph.  [phone] Fax [phone]

## 2019-03-07 ENCOUNTER — Encounter: Payer: Self-pay | Admitting: Family Medicine

## 2019-04-03 ENCOUNTER — Ambulatory Visit (INDEPENDENT_AMBULATORY_CARE_PROVIDER_SITE_OTHER): Payer: Medicare Other | Admitting: Family Medicine

## 2019-04-03 ENCOUNTER — Encounter: Payer: Self-pay | Admitting: Family Medicine

## 2019-04-03 ENCOUNTER — Other Ambulatory Visit: Payer: Self-pay

## 2019-04-03 VITALS — BP 128/80 | HR 84 | Temp 98.9°F | Ht 66.0 in | Wt 188.0 lb

## 2019-04-03 DIAGNOSIS — Z23 Encounter for immunization: Secondary | ICD-10-CM | POA: Diagnosis not present

## 2019-04-03 DIAGNOSIS — I10 Essential (primary) hypertension: Secondary | ICD-10-CM | POA: Diagnosis not present

## 2019-04-03 DIAGNOSIS — G629 Polyneuropathy, unspecified: Secondary | ICD-10-CM

## 2019-04-03 MED ORDER — TRAMADOL HCL 50 MG PO TABS: 50.0000 mg | ORAL_TABLET | Freq: Every evening | ORAL | 0 refills | Status: DC | PRN

## 2019-04-03 MED ORDER — DULOXETINE HCL 30 MG PO CPEP: 30.0000 mg | ORAL_CAPSULE | Freq: Every day | ORAL | 3 refills | Status: DC

## 2019-04-03 NOTE — Progress Notes (Signed)
9/17/202010:57 AM  [name] 07-27-1950, 68 y.o., female [id number]  Chief Complaint  Patient presents with  . Hypertension  . Pain    still having pain in the back, lyrica is not helping as much and it makes her feel "really weird"    HPI:   Patient is a 68 y.o. female with past medical history significant for HTN, CKD3, CVA in 06/2018, HLP, lung cancer, UGIB with h/o transfusion, psoriasis,who presents today forroutine followup  Last OV Aug 2020  Started on lyrica for neuropathy assumed to be chemo related Also tried capasacian and gabapentin lyrica not working Her paraesthesia are worse Now not only right upper leg, but both feet and hands, having more neck and low back pain Patient with known cervical DDD MRI in 2109 IMPRESSION: No detectable acute or traumatic finding. Likely chronic kyphotic curvature in the cervical region. Chronic fusion at C2-3. Facet arthropathy at C3-4 worse on the right with edema that could be symptomatic. Right foraminal stenosis could affect the right C4 nerve. C4-5 spondylosis and facet degeneration. Mild bilateral foraminal narrowing only. C5-6 spondylosis with foraminal narrowing right worse than left that could in particular affect the right C6 nerve. C6-7 spondylosis with foraminal narrowing left worse than right that could in particular affect the left C7 nerve.  BP at home 108-130/66-77 Takes lisinopril and amlodipine Denies any cp, sob, edema, dizzines  Becoming more active at home despite pain Pain interfering with sleep  Depression screen [facility] 2/9 04/03/2019 06/21/2018  Decreased Interest 0 0  Down, Depressed, Hopeless 0 0  PHQ - 2 Score 0 0    Fall Risk  04/03/2019 03/06/2019 02/04/2019 02/04/2019 08/02/2018  Falls in the past year? 0 0 0 0 1  Number falls in past yr: 0 0 0 0 0  Injury with Fall? 0 0 0 0 0     No Known Allergies  Prior to Admission medications   Medication Sig Start Date End Date Taking?  Authorizing Provider  acetaminophen (TYLENOL) 650 MG CR tablet Take 650 mg by mouth every 8 (eight) hours as needed for pain.   Yes [provider]  amLODipine (NORVASC) 10 MG tablet Take 1 tablet (10 mg total) by mouth daily. 02/04/19  Yes Rutherford Guys, MD  atorvastatin (LIPITOR) 80 MG tablet Take 1 tablet (80 mg total) by mouth daily at 6 PM. 08/06/18  Yes Rutherford Guys, MD  feeding supplement, ENSURE ENLIVE, (ENSURE ENLIVE) LIQD Take 237 mLs by mouth 2 (two) times daily between meals. 11/03/18  Yes Mikhail, Maryann, DO  lisinopril (ZESTRIL) 40 MG tablet TAKE 1 TABLET BY MOUTH EVERY DAY 02/17/19  Yes Rutherford Guys, MD  pregabalin (LYRICA) 50 MG capsule Take 1 capsule (50 mg total) by mouth 3 (three) times daily. 03/06/19  Yes Rutherford Guys, MD  prochlorperazine (COMPAZINE) 10 MG tablet TAKE 1 TABLET (10 MG TOTAL) BY MOUTH EVERY 6 (SIX) HOURS AS NEEDED FOR NAUSEA OR VOMITING. 03/07/19   [provider]    Past Medical History:  Diagnosis Date  . Arthritis   . Chronic pain   . Hyperlipidemia   . Hypertension   . Low blood magnesium 10/04/2018  . lung ca dx'd 06/2018  . Stroke ([place])    06/19/2018 minor    Past Surgical History:  Procedure Laterality Date  . BIOPSY  06/19/2018   Procedure: BIOPSY;  Surgeon: Laurence Spates, MD;  [facility];  Service: Endoscopy;;  . BIOPSY  10/11/2018  Procedure: BIOPSY;  Surgeon: Laurence Spates, MD;  Location: [facility];  Service: Endoscopy;;  . ENDARTERECTOMY Right 06/26/2018   Procedure: ENDARTERECTOMY CAROTID RIGHT;  Surgeon: Serafina Mitchell, MD;  [facility];  Service: Vascular;  Laterality: Right;  . ESOPHAGOGASTRODUODENOSCOPY (EGD) WITH PROPOFOL N/A 06/19/2018   Procedure: ESOPHAGOGASTRODUODENOSCOPY (EGD) WITH PROPOFOL;  Surgeon: Laurence Spates, MD;  [facility];  Service: Endoscopy;  Laterality: N/A;  . FLEXIBLE SIGMOIDOSCOPY N/A 10/11/2018   Procedure: FLEXIBLE SIGMOIDOSCOPY;  Surgeon: Laurence Spates, MD;  [facility];  Service: Endoscopy;  Laterality: N/A;  . IR IMAGING GUIDED PORT INSERTION  10/07/2018  . PATCH ANGIOPLASTY Right 06/26/2018   Procedure: PATCH ANGIOPLASTY USING A XENOSURE 1cm x 6cm BIOLOGIC PATCH;  Surgeon: Serafina Mitchell, MD;  Location: [place];  Service: Vascular;  Laterality: Right;  Marland Kitchen VIDEO BRONCHOSCOPY WITH ENDOBRONCHIAL ULTRASOUND N/A 08/14/2018   Procedure: VIDEO BRONCHOSCOPY WITH ENDOBRONCHIAL ULTRASOUND;  Surgeon: Collene Gobble, MD;  [facility];  Service: Thoracic;  Laterality: N/A;    Social History   Tobacco Use  . Smoking status: Former Smoker    Packs/day: 1.00    Years: 10.00    Pack years: 10.00    Types: Cigarettes    Quit date: 06/19/2018    Years since quitting: 0.7  . Smokeless tobacco: Never Used  . Tobacco comment: Currently using Nicorette Gum  Substance Use Topics  . Alcohol use: Yes    Comment: wine 2 glasses a day     Family History  Problem Relation Age of Onset  . Cancer Mother        lung cancer  . Diabetes Other   . Hyperlipidemia Other   . Hypertension Other   . Cancer Other     ROS Per hpi  OBJECTIVE:  Today's Vitals   04/03/19 1041  BP: 128/80  Pulse: 84  Temp: 98.9 F (37.2 C)  SpO2: 93%  Weight: 188 lb (85.3 kg)  Height: 5\' 6"  (1.676 m)   Body mass index is 30.34 kg/m.   Physical Exam Vitals signs and nursing note reviewed.  Constitutional:      Appearance: She is well-developed.  HENT:     Head: Normocephalic and atraumatic.     Mouth/Throat:     Pharynx: No oropharyngeal exudate.  Eyes:     General: No scleral icterus.    Conjunctiva/sclera: Conjunctivae normal.     Pupils: Pupils are equal, round, and reactive to light.  Neck:     Musculoskeletal: Neck supple.  Cardiovascular:     Rate and Rhythm: Normal rate and regular rhythm.     Heart sounds: Normal heart sounds. No murmur. No friction rub. No gallop.   Pulmonary:     Effort: Pulmonary effort is normal.      Breath sounds: Normal breath sounds. No wheezing or rales.  Skin:    General: Skin is warm and dry.  Neurological:     Mental Status: She is alert and oriented to person, place, and time.     No results found for this or any previous visit (from the past 24 hour(s)).  No results found.   ASSESSMENT and PLAN  1. Neuropathy Originally thought to be chemo induced but with DDD might be MSK in nature. EMG would help differentiate as epidural injections might be of benefit. Patient will let me know whom she would like to see. In the meanwhile, dc lyrica, trial of duloxetine. Increase to 60mg  if tolerated. Tramadol at bedtime  prn. Reviewed r/se/b.   2. Essential hypertension Controlled. Continue current regime.   3. Need for prophylactic vaccination and inoculation against influenza - Flu Vaccine QUAD High Dose(Fluad)  Other orders - DULoxetine (CYMBALTA) 30 MG capsule; Take 1 capsule (30 mg total) by mouth daily. - traMADol (ULTRAM) 50 MG tablet; Take 1-2 tablets (50-100 mg total) by mouth at bedtime as needed.  Return in about 2 months (around 06/03/2019).    Rutherford Guys, MD Primary Care at [facility] [place], [place] [address] Ph.  [phone] Fax [phone]

## 2019-04-03 NOTE — Patient Instructions (Addendum)
I am proposing doing an EMG (electromyography)    If you have lab work done today you will be contacted with your lab results within the next 2 weeks.  If you have not heard from [place] then please contact us. The fastest way to get your results is to register for My Chart.   IF you received an x-ray today, you will receive an invoice from [facility]. Please contact [facility] at [phone] with questions or concerns regarding your invoice.   IF you received labwork today, you will receive an invoice from [facility]. Please contact [facility] at [phone] with questions or concerns regarding your invoice.   Our billing staff will not be able to assist you with questions regarding bills from these companies.  You will be contacted with the lab results as soon as they are available. The fastest way to get your results is to activate your My Chart account. Instructions are located on the last page of this paperwork. If you have not heard from [place] regarding the results in 2 weeks, please contact this office.

## 2019-04-08 ENCOUNTER — Inpatient Hospital Stay: Payer: Medicare Other | Attending: Internal Medicine

## 2019-04-08 ENCOUNTER — Other Ambulatory Visit: Payer: Self-pay

## 2019-04-08 DIAGNOSIS — Z923 Personal history of irradiation: Secondary | ICD-10-CM | POA: Diagnosis not present

## 2019-04-08 DIAGNOSIS — Z95828 Presence of other vascular implants and grafts: Secondary | ICD-10-CM

## 2019-04-08 DIAGNOSIS — Z9221 Personal history of antineoplastic chemotherapy: Secondary | ICD-10-CM | POA: Diagnosis not present

## 2019-04-08 DIAGNOSIS — C781 Secondary malignant neoplasm of mediastinum: Secondary | ICD-10-CM | POA: Insufficient documentation

## 2019-04-08 DIAGNOSIS — Z452 Encounter for adjustment and management of vascular access device: Secondary | ICD-10-CM | POA: Diagnosis not present

## 2019-04-08 DIAGNOSIS — C3431 Malignant neoplasm of lower lobe, right bronchus or lung: Secondary | ICD-10-CM | POA: Insufficient documentation

## 2019-04-08 MED ORDER — HEPARIN SOD (PORK) LOCK FLUSH 100 UNIT/ML IV SOLN
500.0000 [IU] | Freq: Once | INTRAVENOUS | Status: AC | PRN
  Administered 2019-04-08: 08:00:00 500 [IU]
  Filled 2019-04-08: qty 5

## 2019-04-08 MED ORDER — SODIUM CHLORIDE 0.9% FLUSH
10.0000 mL | INTRAVENOUS | Status: DC | PRN
  Administered 2019-04-08: 08:00:00 10 mL
  Filled 2019-04-08: qty 10

## 2019-04-10 ENCOUNTER — Other Ambulatory Visit: Payer: Self-pay | Admitting: Family Medicine

## 2019-04-11 ENCOUNTER — Other Ambulatory Visit: Payer: Self-pay | Admitting: Internal Medicine

## 2019-04-13 ENCOUNTER — Encounter: Payer: Self-pay | Admitting: Family Medicine

## 2019-04-25 ENCOUNTER — Other Ambulatory Visit: Payer: Self-pay | Admitting: Family Medicine

## 2019-04-25 NOTE — Telephone Encounter (Signed)
Requested medication (s) are due for refill today: yes  Requested medication (s) are on the active medication list: yes  Last refill:  04/03/2019  Future visit scheduled: yes  Notes to clinic:  Patient requesting 90 day supply with 2 refills   Requested Prescriptions  Pending Prescriptions Disp Refills   DULoxetine (CYMBALTA) 30 MG capsule [Pharmacy Med Name: DULOXETINE HCL DR 30 MG CAP] 90 capsule 2    Sig: TAKE 1 CAPSULE BY [place]     Psychiatry: Antidepressants - SNRI Failed - 04/25/2019 10:31 AM      Failed - Completed PHQ-2 or PHQ-9 in the last 360 days.      Passed - Last BP in normal range    BP Readings from Last 1 Encounters:  04/03/19 128/80         Passed - Valid encounter within last 6 months    Recent Outpatient Visits          3 weeks ago Neuropathy   [facility], Lilia Argue, MD   1 month ago Essential hypertension   [facility], Lilia Argue, MD   2 months ago Megaloblastic anemia   [facility], Lilia Argue, MD   8 months ago Essential hypertension   [facility], Lilia Argue, MD   10 months ago Pain in joint of left shoulder   [facility], Laurey Arrow, MD      Future Appointments            In 1 month Rutherford Guys, MD Primary [facility] at [place], [facility]

## 2019-04-25 NOTE — Telephone Encounter (Signed)
Pt requesting medication 

## 2019-05-02 ENCOUNTER — Other Ambulatory Visit: Payer: Self-pay | Admitting: Family Medicine

## 2019-05-14 ENCOUNTER — Other Ambulatory Visit: Payer: Self-pay | Admitting: Family Medicine

## 2019-05-27 ENCOUNTER — Inpatient Hospital Stay: Payer: Medicare Other

## 2019-05-27 ENCOUNTER — Other Ambulatory Visit: Payer: Self-pay

## 2019-05-27 ENCOUNTER — Inpatient Hospital Stay: Payer: Medicare Other | Attending: Internal Medicine

## 2019-05-27 ENCOUNTER — Ambulatory Visit (HOSPITAL_COMMUNITY)
Admission: RE | Admit: 2019-05-27 | Discharge: 2019-05-27 | Disposition: A | Discharge status: Home / Self Care | Payer: Medicare Other | Source: Ambulatory Visit | Attending: Internal Medicine | Admitting: Internal Medicine

## 2019-05-27 ENCOUNTER — Encounter (HOSPITAL_COMMUNITY): Payer: Self-pay

## 2019-05-27 DIAGNOSIS — M1990 Unspecified osteoarthritis, unspecified site: Secondary | ICD-10-CM | POA: Diagnosis not present

## 2019-05-27 DIAGNOSIS — Z8673 Personal history of transient ischemic attack (TIA), and cerebral infarction without residual deficits: Secondary | ICD-10-CM | POA: Insufficient documentation

## 2019-05-27 DIAGNOSIS — Z79899 Other long term (current) drug therapy: Secondary | ICD-10-CM | POA: Insufficient documentation

## 2019-05-27 DIAGNOSIS — C3431 Malignant neoplasm of lower lobe, right bronchus or lung: Secondary | ICD-10-CM | POA: Insufficient documentation

## 2019-05-27 DIAGNOSIS — N289 Disorder of kidney and ureter, unspecified: Secondary | ICD-10-CM | POA: Insufficient documentation

## 2019-05-27 DIAGNOSIS — Z923 Personal history of irradiation: Secondary | ICD-10-CM | POA: Insufficient documentation

## 2019-05-27 DIAGNOSIS — Z9221 Personal history of antineoplastic chemotherapy: Secondary | ICD-10-CM | POA: Insufficient documentation

## 2019-05-27 DIAGNOSIS — E785 Hyperlipidemia, unspecified: Secondary | ICD-10-CM | POA: Diagnosis not present

## 2019-05-27 DIAGNOSIS — G629 Polyneuropathy, unspecified: Secondary | ICD-10-CM | POA: Diagnosis not present

## 2019-05-27 DIAGNOSIS — I10 Essential (primary) hypertension: Secondary | ICD-10-CM | POA: Diagnosis not present

## 2019-05-27 DIAGNOSIS — Z95828 Presence of other vascular implants and grafts: Secondary | ICD-10-CM

## 2019-05-27 LAB — MAGNESIUM: Magnesium: 2 mg/dL (ref 1.7–2.4)

## 2019-05-27 LAB — CBC WITH DIFFERENTIAL (CANCER CENTER ONLY)
Abs Immature Granulocytes: 0.01 10*3/uL (ref 0.00–0.07)
Basophils Absolute: 0 10*3/uL (ref 0.0–0.1)
Basophils Relative: 0 %
Eosinophils Absolute: 0.2 10*3/uL (ref 0.0–0.5)
Eosinophils Relative: 4 %
HCT: 30.5 % — ABNORMAL LOW (ref 36.0–46.0)
Hemoglobin: 9.7 g/dL — ABNORMAL LOW (ref 12.0–15.0)
Immature Granulocytes: 0 %
Lymphocytes Relative: 14 %
Lymphs Abs: 0.7 10*3/uL (ref 0.7–4.0)
MCH: 35.5 pg — ABNORMAL HIGH (ref 26.0–34.0)
MCHC: 31.8 g/dL (ref 30.0–36.0)
MCV: 111.7 fL — ABNORMAL HIGH (ref 80.0–100.0)
Monocytes Absolute: 0.4 10*3/uL (ref 0.1–1.0)
Monocytes Relative: 8 %
Neutro Abs: 3.5 10*3/uL (ref 1.7–7.7)
Neutrophils Relative %: 74 %
Platelet Count: 174 10*3/uL (ref 150–400)
RBC: 2.73 MIL/uL — ABNORMAL LOW (ref 3.87–5.11)
RDW: 13.1 % (ref 11.5–15.5)
WBC Count: 4.8 10*3/uL (ref 4.0–10.5)
nRBC: 0 % (ref 0.0–0.2)

## 2019-05-27 LAB — CMP (CANCER CENTER ONLY)
ALT: 25 U/L (ref 0–44)
AST: 25 U/L (ref 15–41)
Albumin: 3.7 g/dL (ref 3.5–5.0)
Alkaline Phosphatase: 67 U/L (ref 38–126)
Anion gap: 9 (ref 5–15)
BUN: 31 mg/dL — ABNORMAL HIGH (ref 8–23)
CO2: 20 mmol/L — ABNORMAL LOW (ref 22–32)
Calcium: 8.7 mg/dL — ABNORMAL LOW (ref 8.9–10.3)
Chloride: 109 mmol/L (ref 98–111)
Creatinine: 1.39 mg/dL — ABNORMAL HIGH (ref 0.44–1.00)
GFR, Est AFR Am: 46 mL/min — ABNORMAL LOW (ref >60)
GFR, Estimated: 39 mL/min — ABNORMAL LOW (ref >60)
Glucose, Bld: 99 mg/dL (ref 70–99)
Potassium: 4.9 mmol/L (ref 3.5–5.1)
Sodium: 138 mmol/L (ref 135–145)
Total Bilirubin: 0.2 mg/dL — ABNORMAL LOW (ref 0.3–1.2)
Total Protein: 6.4 g/dL — ABNORMAL LOW (ref 6.5–8.1)

## 2019-05-27 MED ORDER — HEPARIN SOD (PORK) LOCK FLUSH 100 UNIT/ML IV SOLN
INTRAVENOUS | Status: AC
  Filled 2019-05-27: qty 5

## 2019-05-27 MED ORDER — HEPARIN SOD (PORK) LOCK FLUSH 100 UNIT/ML IV SOLN: 500.0000 [IU] | Freq: Once | INTRAVENOUS | Status: DC

## 2019-05-27 MED ORDER — HEPARIN SOD (PORK) LOCK FLUSH 100 UNIT/ML IV SOLN
500.0000 [IU] | Freq: Once | INTRAVENOUS | Status: DC | PRN
  Filled 2019-05-27: qty 5

## 2019-05-27 MED ORDER — SODIUM CHLORIDE 0.9% FLUSH
10.0000 mL | INTRAVENOUS | Status: DC | PRN
  Administered 2019-05-27: 10 mL
  Filled 2019-05-27: qty 10

## 2019-05-29 ENCOUNTER — Encounter: Payer: Self-pay | Admitting: Internal Medicine

## 2019-05-29 ENCOUNTER — Inpatient Hospital Stay (HOSPITAL_BASED_OUTPATIENT_CLINIC_OR_DEPARTMENT_OTHER): Payer: Medicare Other | Admitting: Internal Medicine

## 2019-05-29 ENCOUNTER — Other Ambulatory Visit: Payer: Self-pay

## 2019-05-29 VITALS — BP 155/95 | HR 102 | Temp 99.1°F | Resp 17 | Ht 66.0 in | Wt 185.8 lb

## 2019-05-29 DIAGNOSIS — C3431 Malignant neoplasm of lower lobe, right bronchus or lung: Secondary | ICD-10-CM | POA: Diagnosis not present

## 2019-05-29 NOTE — Progress Notes (Signed)
[facility] [phone]   [phone]  OFFICE PROGRESS NOTE  Rutherford Guys, MD [address] [name] [address]  DIAGNOSIS: Limited stage (T2b, N2, M0) small cell lung cancer, pending further staging work-up diagnosed and January 2020.  She presented with right lower lobe lung mass in addition to right hilar and mediastinal lymphadenopathy.  PRIOR THERAPY: Systemic chemotherapy concurrent with radiation.  Chemotherapy withcisplatin 80 mg/M2 on day 1 and etoposide 100 mg/M2 on days 1, 2 and 3 every 3 weeks.First dose September 03, 2018. Status post5cycles.Dose reducedstarting fromcycle #4to cisplatin 40 mg/m2 and etoposide 80 mg/m2 due to renal insufficiency.  CURRENT THERAPY: Observation.  INTERVAL HISTORY: [name] 68 y.o. female returns to the clinic today for follow-up visit.  The patient is feeling fine today with no concerning complaints except for persistent mild peripheral neuropathy and currently on gabapentin.  She denied having any current chest pain, shortness of breath, cough or hemoptysis.  She denied having any fever or chills.  She has no nausea, vomiting, diarrhea or constipation.  She denied having any headache or visual changes.  She had repeat CT scan of the chest performed recently and she is here for evaluation and discussion of her scan results.  MEDICAL HISTORY: Past Medical History:  Diagnosis Date  . Arthritis   . Chronic pain   . Hyperlipidemia   . Hypertension   . Low blood magnesium 10/04/2018  . lung ca dx'd 06/2018  . Stroke ([place])    06/19/2018 minor    ALLERGIES:  has No Known Allergies.  MEDICATIONS:  Current Outpatient Medications  Medication Sig Dispense Refill  . acetaminophen (TYLENOL) 650 MG CR tablet Take 650 mg by mouth every 8 (eight) hours as needed for pain.    Marland Kitchen amLODipine (NORVASC) 10 MG tablet TAKE 1 TABLET BY MOUTH EVERY DAY 90 tablet 1  . atorvastatin (LIPITOR) 80 MG  tablet Take 1 tablet (80 mg total) by mouth daily at 6 PM. 90 tablet 3  . DULoxetine (CYMBALTA) 30 MG capsule TAKE 1 CAPSULE BY MOUTH EVERY DAY 90 capsule 2  . feeding supplement, ENSURE ENLIVE, (ENSURE ENLIVE) LIQD Take 237 mLs by mouth 2 (two) times daily between meals. 60 Bottle 0  . lisinopril (ZESTRIL) 40 MG tablet TAKE 1 TABLET BY MOUTH EVERY DAY 90 tablet 0  . prochlorperazine (COMPAZINE) 10 MG tablet TAKE 1 TABLET (10 MG TOTAL) BY MOUTH EVERY 6 (SIX) HOURS AS NEEDED FOR NAUSEA OR VOMITING.    . traMADol (ULTRAM) 50 MG tablet Take 1-2 tablets (50-100 mg total) by mouth at bedtime as needed. 30 tablet 0   No current facility-administered medications for this visit.     SURGICAL HISTORY:  Past Surgical History:  Procedure Laterality Date  . BIOPSY  06/19/2018   Procedure: BIOPSY;  Surgeon: Laurence Spates, MD;  Location: [facility];  Service: Endoscopy;;  . BIOPSY  10/11/2018   Procedure: BIOPSY;  Surgeon: Laurence Spates, MD;  Location: [facility];  Service: Endoscopy;;  . ENDARTERECTOMY Right 06/26/2018   Procedure: ENDARTERECTOMY CAROTID RIGHT;  Surgeon: Serafina Mitchell, MD;  Location: [facility] OR;  Service: Vascular;  Laterality: Right;  . ESOPHAGOGASTRODUODENOSCOPY (EGD) WITH PROPOFOL N/A 06/19/2018   Procedure: ESOPHAGOGASTRODUODENOSCOPY (EGD) WITH PROPOFOL;  Surgeon: Laurence Spates, MD;  [facility];  Service: Endoscopy;  Laterality: N/A;  . FLEXIBLE SIGMOIDOSCOPY N/A 10/11/2018   Procedure: FLEXIBLE SIGMOIDOSCOPY;  Surgeon: Laurence Spates, MD;  [facility];  Service: Endoscopy;  Laterality: N/A;  . IR IMAGING GUIDED PORT INSERTION  10/07/2018  . PATCH ANGIOPLASTY Right 06/26/2018   Procedure: PATCH ANGIOPLASTY USING A XENOSURE 1cm x 6cm BIOLOGIC PATCH;  Surgeon: Serafina Mitchell, MD;  Location: [facility];  Service: Vascular;  Laterality: Right;  Marland Kitchen VIDEO BRONCHOSCOPY WITH ENDOBRONCHIAL ULTRASOUND N/A 08/14/2018   Procedure: VIDEO BRONCHOSCOPY WITH ENDOBRONCHIAL  ULTRASOUND;  Surgeon: Collene Gobble, MD;  Location: [facility];  Service: Thoracic;  Laterality: N/A;    REVIEW OF SYSTEMS:  A comprehensive review of systems was negative except for: Neurological: positive for paresthesia   PHYSICAL EXAMINATION: General appearance: alert, cooperative and no distress Head: Normocephalic, without obvious abnormality, atraumatic Neck: no adenopathy, no JVD, supple, symmetrical, trachea midline and thyroid not enlarged, symmetric, no tenderness/mass/nodules Lymph nodes: Cervical, supraclavicular, and axillary nodes normal. Resp: clear to auscultation bilaterally Back: symmetric, no curvature. ROM normal. No CVA tenderness. Cardio: regular rate and rhythm, S1, S2 normal, no murmur, click, rub or gallop GI: soft, non-tender; bowel sounds normal; no masses,  no organomegaly Extremities: extremities normal, atraumatic, no cyanosis or edema  ECOG PERFORMANCE STATUS: 1 - Symptomatic but completely ambulatory  Blood pressure (!) 155/95, pulse (!) 102, temperature 99.1 F (37.3 C), temperature source Temporal, resp. rate 17, height 5\' 6"  (1.676 m), weight 185 lb 12.8 oz (84.3 kg), SpO2 97 %.  LABORATORY DATA: Lab Results  Component Value Date   WBC 4.8 05/27/2019   HGB 9.7 (L) 05/27/2019   HCT 30.5 (L) 05/27/2019   MCV 111.7 (H) 05/27/2019   PLT 174 05/27/2019      Chemistry      Component Value Date/Time   NA 138 05/27/2019 0814   NA 142 08/02/2018 1042   K 4.9 05/27/2019 0814   CL 109 05/27/2019 0814   CO2 20 (L) 05/27/2019 0814   BUN 31 (H) 05/27/2019 0814   BUN 9 08/02/2018 1042   CREATININE 1.39 (H) 05/27/2019 0814      Component Value Date/Time   CALCIUM 8.7 (L) 05/27/2019 0814   ALKPHOS 67 05/27/2019 0814   AST 25 05/27/2019 0814   ALT 25 05/27/2019 0814   BILITOT 0.2 (L) 05/27/2019 0814       RADIOGRAPHIC STUDIES: Ct Chest Wo Contrast  Result Date: 05/27/2019 CLINICAL DATA:  Restaging lung cancer. EXAM: CT CHEST WITHOUT CONTRAST  TECHNIQUE: Multidetector CT imaging of the chest was performed following the standard protocol without IV contrast. COMPARISON:  02/21/2019 FINDINGS: Cardiovascular: The heart is normal in size. Small amount of pericardial fluid but no effusion. Prominent pericardial and epicardial fat. Stable tortuosity, ectasia and calcification of the thoracic aorta. Ductus diverticulum noted. Stable three-vessel coronary artery calcifications. Mediastinum/Nodes: Enlarged right paratracheal lymph node measures 3.6 x 2.8 cm on image 46/2. This previously measured 3.9 x 2.7 cm. No obvious hilar adenopathy. 14 mm subcarinal lymph node on image 62/2 is stable. Lungs/Pleura: Right lower lobe lesion measures approximately 2.4 x 2.1 cm on image 82/7. This previously measured approximately 2.5 x 2.0 cm. Progressive surrounding interstitial changes, patchy airspace opacity and consolidation/atelectasis involving the paramediastinal lung, right upper lobe and right lower lobe. This is most likely progressive radiation change. Interstitial spread of tumor would be another possibility but cm have a fairly well defined medial margin and I think it is more likely radiation. Persistent right-sided pleural effusion with no significant overlying atelectasis. A few small scattered peripheral right upper lobe subpleural nodules are stable. A few small left lung nodules are also stable. Upper Abdomen:  No significant upper abdominal findings. Small adrenal gland nodules are likely adenomas and are unchanged. Musculoskeletal: No significant bony findings. IMPRESSION: 1. Slightly smaller right lower lobe lesion and right paratracheal lymph node. 2. Progressive probable radiation changes involving the right lung. 3. A few small scattered subpleural pulmonary nodules are unchanged. 4. Stable right-sided pleural effusion. 5. Stable small left adrenal gland nodule consistent with benign adenoma. 6. Stable age advanced atherosclerotic calcifications  involving the thoracic and abdominal aorta and branch vessels including three-vessel coronary artery calcifications. 7. No new/progressive findings when compared to prior study. Aortic Atherosclerosis (ICD10-I70.0) and Emphysema (ICD10-J43.9). Electronically Signed   By: Marijo Sanes M.D.   On: 05/27/2019 10:02    ASSESSMENT AND PLAN: This is a very pleasant 68 years old white female with limited stage small cell lung cancer diagnosed in January 2020 status post systemic chemotherapy initially with cisplatin and etoposide status post 5 cycles.  Her dose of cisplatin and etoposide was significantly reduced starting from cycle #4 secondary to worsening renal insufficiency.  Her treatment was concurrent with radiotherapy. The patient is currently on observation and she is feeling fine. She had repeat CT scan of the chest performed recently.  I personally and independently reviewed the scans and discussed the results with the patient today. Her scan showed no concerning findings for disease progression. I recommended for the patient to continue on observation with repeat CT scan of the chest in 3 months. For the peripheral neuropathy, I recommended for the patient to continue her current treatment with gabapentin. We will also arrange for the patient to have Port-A-Cath flush every 6 weeks. She was advised to call immediately if she has any concerning symptoms in the interval. The patient voices understanding of current disease status and treatment options and is in agreement with the current care plan.  All questions were answered. The patient knows to call the clinic with any problems, questions or concerns. We can certainly see the patient much sooner if necessary.  I spent 10 minutes counseling the patient face to face. The total time spent in the appointment was 15 minutes.  Disclaimer: This note was dictated with voice recognition software. Similar sounding words can inadvertently be transcribed  and may not be corrected upon review.

## 2019-05-30 ENCOUNTER — Telehealth: Payer: Self-pay | Admitting: Internal Medicine

## 2019-05-30 NOTE — Telephone Encounter (Signed)
Scheduled per los. Called and spoke with patient. Confirmed appt 

## 2019-06-05 ENCOUNTER — Other Ambulatory Visit: Payer: Self-pay

## 2019-06-05 ENCOUNTER — Ambulatory Visit: Payer: Medicare Other | Admitting: Family Medicine

## 2019-06-05 ENCOUNTER — Encounter: Payer: Self-pay | Admitting: Family Medicine

## 2019-06-05 VITALS — BP 104/80 | HR 96 | Temp 98.4°F | Ht 66.0 in | Wt 186.2 lb

## 2019-06-05 DIAGNOSIS — C3431 Malignant neoplasm of lower lobe, right bronchus or lung: Secondary | ICD-10-CM

## 2019-06-05 DIAGNOSIS — N1832 Chronic kidney disease, stage 3b: Secondary | ICD-10-CM

## 2019-06-05 DIAGNOSIS — I10 Essential (primary) hypertension: Secondary | ICD-10-CM | POA: Diagnosis not present

## 2019-06-05 DIAGNOSIS — G629 Polyneuropathy, unspecified: Secondary | ICD-10-CM | POA: Diagnosis not present

## 2019-06-05 DIAGNOSIS — L409 Psoriasis, unspecified: Secondary | ICD-10-CM

## 2019-06-05 MED ORDER — TRIAMCINOLONE ACETONIDE 0.5 % EX CREA: 1.0000 "application " | TOPICAL_CREAM | Freq: Two times a day (BID) | CUTANEOUS | 0 refills | Status: DC

## 2019-06-05 MED ORDER — TRAMADOL HCL 50 MG PO TABS: 50.0000 mg | ORAL_TABLET | Freq: Every evening | ORAL | 1 refills | Status: DC | PRN

## 2019-06-05 NOTE — Progress Notes (Signed)
11/19/20209:26 AM  [name] 03/24/1951, 68 y.o., female [id number]  Chief Complaint  Patient presents with  . Follow-up    neuropathy, ct and lab results. Has a issue she will like to talk about    HPI:    Patient is a 68 y.o. female with past medical history significant for HTN, CKD3, CVA in 06/2018, HLP, lung cancer, UGIB with h/o transfusion, psoriasis,who presents today forroutine followup  Last OV Sept 2020  Changed lyrica to duloxetine, tramadol at bedtime Saw onc last week - stable CT chest, cont q3 surveillance She is overall feeling good Has been very active at home Goes up and down stairs wo issues No falls Has been checking BP at home, as low 70/50s She denies any fatigue, dizziness, lightheaded, CP, SOB Her psoriasis has flared up,legs, arms, back, using OTC Neuropathy much improved on lyrica, duloxetine. Takes tramadol very prn pmp reviewed  Lab Results  Component Value Date   CREATININE 1.39 (H) 05/27/2019   BUN 31 (H) 05/27/2019   NA 138 05/27/2019   K 4.9 05/27/2019   CL 109 05/27/2019   CO2 20 (L) 05/27/2019  GFR 39  Lab Results  Component Value Date   CREATININE 1.39 (H) 05/27/2019   CREATININE 1.68 (H) 02/21/2019   CREATININE 1.76 (H) 12/31/2018    Depression screen PHQ 2/9 06/05/2019 04/03/2019 06/21/2018  Decreased Interest 0 0 0  Down, Depressed, Hopeless 0 0 0  PHQ - 2 Score 0 0 0  Some recent data might be hidden    Fall Risk  06/05/2019 04/03/2019 03/06/2019 02/04/2019 02/04/2019  Falls in the past year? 0 0 0 0 0  Number falls in past yr: 0 0 0 0 0  Injury with Fall? 0 0 0 0 0     No Known Allergies  Prior to Admission medications   Medication Sig Start Date End Date Taking? Authorizing Provider  acetaminophen (TYLENOL) 650 MG CR tablet Take 650 mg by mouth every 8 (eight) hours as needed for pain.   Yes [provider]  amLODipine (NORVASC) 10 MG tablet TAKE 1 TABLET BY MOUTH EVERY DAY 05/02/19  Yes Rutherford Guys, MD  atorvastatin (LIPITOR) 80 MG tablet Take 1 tablet (80 mg total) by mouth daily at 6 PM. 08/06/18  Yes Rutherford Guys, MD  DULoxetine (CYMBALTA) 30 MG capsule TAKE 1 CAPSULE BY MOUTH EVERY DAY 04/25/19  Yes Rutherford Guys, MD  feeding supplement, ENSURE ENLIVE, (ENSURE ENLIVE) LIQD Take 237 mLs by mouth 2 (two) times daily between meals. 11/03/18  Yes Mikhail, Maryann, DO  lisinopril (ZESTRIL) 40 MG tablet TAKE 1 TABLET BY MOUTH EVERY DAY 05/14/19  Yes Rutherford Guys, MD  pregabalin (LYRICA) 50 MG capsule Take 50 mg by mouth 3 (three) times daily. 05/13/19  Yes [provider]  prochlorperazine (COMPAZINE) 10 MG tablet TAKE 1 TABLET (10 MG TOTAL) BY MOUTH EVERY 6 (SIX) HOURS AS NEEDED FOR NAUSEA OR VOMITING. 03/07/19  Yes [provider]  traMADol (ULTRAM) 50 MG tablet Take 1-2 tablets (50-100 mg total) by mouth at bedtime as needed. 04/03/19  Yes Rutherford Guys, MD    Past Medical History:  Diagnosis Date  . Arthritis   . Chronic pain   . Hyperlipidemia   . Hypertension   . Low blood magnesium 10/04/2018  . lung ca dx'd 06/2018  . Stroke ([place])    06/19/2018 minor    Past Surgical History:  Procedure Laterality Date  .  BIOPSY  06/19/2018   Procedure: BIOPSY;  Surgeon: Laurence Spates, MD;  [facility];  Service: Endoscopy;;  . BIOPSY  10/11/2018   Procedure: BIOPSY;  Surgeon: Laurence Spates, MD;  [facility];  Service: Endoscopy;;  . ENDARTERECTOMY Right 06/26/2018   Procedure: ENDARTERECTOMY CAROTID RIGHT;  Surgeon: Serafina Mitchell, MD;  Location: [facility] OR;  Service: Vascular;  Laterality: Right;  . ESOPHAGOGASTRODUODENOSCOPY (EGD) WITH PROPOFOL N/A 06/19/2018   Procedure: ESOPHAGOGASTRODUODENOSCOPY (EGD) WITH PROPOFOL;  Surgeon: Laurence Spates, MD;  [facility];  Service: Endoscopy;  Laterality: N/A;  . FLEXIBLE SIGMOIDOSCOPY N/A 10/11/2018   Procedure: FLEXIBLE SIGMOIDOSCOPY;  Surgeon: Laurence Spates, MD;  [facility];  Service: Endoscopy;  Laterality: N/A;  . IR IMAGING GUIDED PORT INSERTION  10/07/2018  . PATCH ANGIOPLASTY Right 06/26/2018   Procedure: PATCH ANGIOPLASTY USING A XENOSURE 1cm x 6cm BIOLOGIC PATCH;  Surgeon: Serafina Mitchell, MD;  Location: [facility];  Service: Vascular;  Laterality: Right;  Marland Kitchen VIDEO BRONCHOSCOPY WITH ENDOBRONCHIAL ULTRASOUND N/A 08/14/2018   Procedure: VIDEO BRONCHOSCOPY WITH ENDOBRONCHIAL ULTRASOUND;  Surgeon: Collene Gobble, MD;  Location: [facility];  Service: Thoracic;  Laterality: N/A;    Social History   Tobacco Use  . Smoking status: Former Smoker    Packs/day: 1.00    Years: 10.00    Pack years: 10.00    Types: Cigarettes    Quit date: 06/19/2018    Years since quitting: 0.9  . Smokeless tobacco: Never Used  . Tobacco comment: Currently using Nicorette Gum  Substance Use Topics  . Alcohol use: Yes    Comment: wine 2 glasses a day     Family History  Problem Relation Age of Onset  . Cancer Mother        lung cancer  . Diabetes Other   . Hyperlipidemia Other   . Hypertension Other   . Cancer Other     Review of Systems  Constitutional: Negative for chills and fever.  Respiratory: Negative for cough and shortness of breath.   Cardiovascular: Negative for chest pain, palpitations and leg swelling.  Gastrointestinal: Negative for abdominal pain, nausea and vomiting.  per hpi   OBJECTIVE:  Today's Vitals   06/05/19 0905  BP: 104/80  Pulse: 96  Temp: 98.4 F (36.9 C)  SpO2: 95%  Weight: 186 lb 3.2 oz (84.5 kg)  Height: 5\' 6"  (1.676 m)   Body mass index is 30.05 kg/m.   Physical Exam Vitals signs and nursing note reviewed.  Constitutional:      Appearance: She is well-developed.  HENT:     Head: Normocephalic and atraumatic.     Mouth/Throat:     Pharynx: No oropharyngeal exudate.  Eyes:     General: No scleral icterus.    Conjunctiva/sclera: Conjunctivae normal.     Pupils: Pupils are equal, round, and reactive to light.  Neck:      Musculoskeletal: Neck supple.  Cardiovascular:     Rate and Rhythm: Normal rate and regular rhythm.     Heart sounds: Normal heart sounds. No murmur. No friction rub. No gallop.   Pulmonary:     Effort: Pulmonary effort is normal.     Breath sounds: Normal breath sounds. No wheezing or rales.  Musculoskeletal:     Right lower leg: No edema.     Left lower leg: No edema.  Skin:    General: Skin is warm and dry.     Findings: Rash (scattered erythematous macules and plaques, some  with silvery scales) present.  Neurological:     Mental Status: She is alert and oriented to person, place, and time.     No results found for this or any previous visit (from the past 24 hour(s)).  No results found.   ASSESSMENT and PLAN  1. Neuropathy Sign improved. Cont current regime. pmp reviewed  2. Essential hypertension Controlled in clinic. Low BP at home that has been asymptomatic. Bring BP cuff to clinic, consider decreasing amlodipine.  3. Stage 3b chronic kidney disease Improving. meds currently renally dosed. Avoid nephrotoxins.  4. Psoriasis Uncontrolled. Trial of triamcinolone, reviewed r/se/b. Declines derm referral at this time  5. Small cell carcinoma of lower lobe of right lung (HCC) Stable. Managed by onc  Other orders - triamcinolone cream (KENALOG) 0.5 %; Apply 1 application topically 2 (two) times daily. - traMADol (ULTRAM) 50 MG tablet; Take 1-2 tablets (50-100 mg total) by mouth at bedtime as needed.  Return in about 3 months (around 09/05/2019).    Rutherford Guys, MD Primary Care at [place], [place] [address] Ph.  [phone] Fax [phone]

## 2019-06-05 NOTE — Patient Instructions (Signed)
° ° ° °  If you have lab work done today you will be contacted with your lab results within the next 2 weeks.  If you have not heard from us then please contact us. The fastest way to get your results is to register for My Chart. ° ° °IF you received an x-ray today, you will receive an invoice from [facility]. Please contact [facility] at [phone] with questions or concerns regarding your invoice.  ° °IF you received labwork today, you will receive an invoice from [facility]. Please contact [facility] at [phone] with questions or concerns regarding your invoice.  ° °Our billing staff will not be able to assist you with questions regarding bills from these companies. ° °You will be contacted with the lab results as soon as they are available. The fastest way to get your results is to activate your My Chart account. Instructions are located on the last page of this paperwork. If you have not heard from us regarding the results in 2 weeks, please contact this office. °  ° ° ° °

## 2019-06-06 ENCOUNTER — Encounter: Payer: Self-pay | Admitting: Family Medicine

## 2019-06-06 ENCOUNTER — Telehealth: Payer: Self-pay | Admitting: *Deleted

## 2019-06-06 NOTE — Telephone Encounter (Signed)
Schedule AWV.  

## 2019-06-23 ENCOUNTER — Encounter: Payer: Self-pay | Admitting: Family Medicine

## 2019-06-24 ENCOUNTER — Other Ambulatory Visit: Payer: Self-pay | Admitting: Family Medicine

## 2019-06-24 MED ORDER — CLOBETASOL PROP EMOLLIENT BASE 0.05 % EX CREA: 1.0000 "application " | TOPICAL_CREAM | Freq: Two times a day (BID) | CUTANEOUS | 3 refills | Status: DC

## 2019-06-25 ENCOUNTER — Encounter: Payer: Self-pay | Admitting: *Deleted

## 2019-07-08 ENCOUNTER — Inpatient Hospital Stay: Payer: Medicare Other | Attending: Internal Medicine

## 2019-07-08 ENCOUNTER — Other Ambulatory Visit: Payer: Self-pay

## 2019-07-08 DIAGNOSIS — Z9221 Personal history of antineoplastic chemotherapy: Secondary | ICD-10-CM | POA: Diagnosis not present

## 2019-07-08 DIAGNOSIS — Z95828 Presence of other vascular implants and grafts: Secondary | ICD-10-CM

## 2019-07-08 DIAGNOSIS — C3431 Malignant neoplasm of lower lobe, right bronchus or lung: Secondary | ICD-10-CM | POA: Insufficient documentation

## 2019-07-08 DIAGNOSIS — Z452 Encounter for adjustment and management of vascular access device: Secondary | ICD-10-CM | POA: Diagnosis not present

## 2019-07-08 DIAGNOSIS — Z923 Personal history of irradiation: Secondary | ICD-10-CM | POA: Diagnosis not present

## 2019-07-08 MED ORDER — HEPARIN SOD (PORK) LOCK FLUSH 100 UNIT/ML IV SOLN
500.0000 [IU] | Freq: Once | INTRAVENOUS | Status: AC | PRN
  Administered 2019-07-08: 08:00:00 500 [IU]
  Filled 2019-07-08: qty 5

## 2019-07-08 MED ORDER — SODIUM CHLORIDE 0.9% FLUSH
10.0000 mL | INTRAVENOUS | Status: DC | PRN
  Administered 2019-07-08: 10 mL
  Filled 2019-07-08: qty 10

## 2019-07-08 NOTE — Patient Instructions (Signed)

## 2019-07-14 ENCOUNTER — Other Ambulatory Visit: Payer: Self-pay | Admitting: Family Medicine

## 2019-07-14 ENCOUNTER — Encounter: Payer: Self-pay | Admitting: Family Medicine

## 2019-07-14 ENCOUNTER — Other Ambulatory Visit: Payer: Medicare Other

## 2019-07-19 ENCOUNTER — Other Ambulatory Visit: Payer: Self-pay | Admitting: Family Medicine

## 2019-07-20 ENCOUNTER — Encounter: Payer: Self-pay | Admitting: Family Medicine

## 2019-07-20 NOTE — Telephone Encounter (Signed)
Requested medication (s) are due for refill today: yes  Requested medication (s) are on the active medication list: yes  Last refill:  06/10/2019  Future visit scheduled:yes  Notes to clinic:  this refill cannot be delegated Looks like script was printed several days ago Please advise    Requested Prescriptions  Pending Prescriptions Disp Refills   pregabalin (LYRICA) 50 MG capsule [Pharmacy Med Name: PREGABALIN 50 MG CAPSULE] 90 capsule 3    Sig: TAKE 1 CAPSULE BY MOUTH 3 TIMES A DAY      Not Delegated - Neurology:  Anticonvulsants - Controlled Failed - 07/19/2019 10:21 AM      Failed - This refill cannot be delegated      Passed - Valid encounter within last 12 months    Recent Outpatient Visits           1 month ago Neuropathy   [facility], Lilia Argue, MD   3 months ago Neuropathy   [facility], Lilia Argue, MD   4 months ago Essential hypertension   [facility], Lilia Argue, MD   5 months ago Megaloblastic anemia   [facility], Lilia Argue, MD   11 months ago Essential hypertension   [facility], Lilia Argue, MD       Future Appointments             In 1 month Rutherford Guys, MD Primary Care at [place], [facility]

## 2019-07-22 ENCOUNTER — Other Ambulatory Visit: Payer: Self-pay | Admitting: Family Medicine

## 2019-07-22 ENCOUNTER — Telehealth: Payer: Self-pay | Admitting: Family Medicine

## 2019-07-22 NOTE — Telephone Encounter (Signed)
Pt has called back and wants to be called back asap if not refilled  [phone]

## 2019-07-22 NOTE — Telephone Encounter (Signed)
pregabalin (LYRICA) 50 MG capsule [[id number]]   Pt says that the pharmacy has not received the prescription refill.  Pt also says that she is willing to have someone pick up a hard copy and take it to the pharmacy if possible   Please advise

## 2019-07-22 NOTE — Telephone Encounter (Signed)
I sent the pt a mychart message informing them that her medication was filled on 07/15/2019 for 90 days with 3 refills. Pt shouldn't need a refill sent in for this medication for a while.

## 2019-07-22 NOTE — Telephone Encounter (Signed)
pregabalin (LYRICA) 50 MG capsule 90 capsule 3 07/15/2019    Sig: TAKE 1 CAPSULE BY MOUTH 3 TIMES A DAY   Class: Print   Notes to Pharmacy: Not to exceed 2 additional fills before 09/02/2019.    Please resend this .  [id number] [place], [place] - [address] AT [address] [place] Phone:  [phone]  Fax:  [phone]     [facility] and made a couple calls to [facility], no receipt on receiving by pharmacy

## 2019-07-23 NOTE — Telephone Encounter (Signed)
Received a call from the pt stating rx has not been received.   Rx [id number]   Pt is scheduled to see provider on 09/04/19.

## 2019-07-23 NOTE — Telephone Encounter (Signed)
Pt. Called again requesting refill asserts out. After a brief review medication seems to have been printed not e-scribed. PT. Asserts pharmacy never received medication. Contacted CMA Mei, who said that the medication could be called in and that the clinical staff would attempt to

## 2019-07-24 ENCOUNTER — Other Ambulatory Visit: Payer: Self-pay | Admitting: Family Medicine

## 2019-07-24 DIAGNOSIS — E785 Hyperlipidemia, unspecified: Secondary | ICD-10-CM

## 2019-07-24 NOTE — Telephone Encounter (Signed)
Forwarding medication refill request to the clinical pool for review. 

## 2019-07-25 NOTE — Telephone Encounter (Signed)
Rx has already been called and refilled already.

## 2019-07-31 ENCOUNTER — Encounter: Payer: Self-pay | Admitting: Internal Medicine

## 2019-08-04 ENCOUNTER — Other Ambulatory Visit: Payer: Self-pay | Admitting: Internal Medicine

## 2019-08-04 DIAGNOSIS — C3431 Malignant neoplasm of lower lobe, right bronchus or lung: Secondary | ICD-10-CM

## 2019-08-05 ENCOUNTER — Other Ambulatory Visit: Payer: Self-pay | Admitting: Family Medicine

## 2019-08-05 ENCOUNTER — Telehealth: Payer: Self-pay | Admitting: Medical Oncology

## 2019-08-05 NOTE — Telephone Encounter (Signed)
Pt notified that CT scan ordered without contrast.

## 2019-08-05 NOTE — Telephone Encounter (Signed)
Requested Prescriptions  Pending Prescriptions Disp Refills  . lisinopril (ZESTRIL) 40 MG tablet [Pharmacy Med Name: LISINOPRIL 40 MG TABLET] 90 tablet 0    Sig: TAKE 1 TABLET BY MOUTH EVERY DAY     Cardiovascular:  ACE Inhibitors Failed - 08/05/2019  1:23 AM      Failed - Cr in normal range and within 180 days    Creatinine  Date Value Ref Range Status  05/27/2019 1.39 (H) 0.44 - 1.00 mg/dL Final         Passed - K in normal range and within 180 days    Potassium  Date Value Ref Range Status  05/27/2019 4.9 3.5 - 5.1 mmol/L Final         Passed - Patient is not pregnant      Passed - Last BP in normal range    BP Readings from Last 1 Encounters:  06/05/19 104/80         Passed - Valid encounter within last 6 months    Recent Outpatient Visits          2 months ago Neuropathy   [facility], Lilia Argue, MD   4 months ago Neuropathy   [facility], Lilia Argue, MD   5 months ago Essential hypertension   [facility], Lilia Argue, MD   6 months ago Megaloblastic anemia   [facility], Lilia Argue, MD   1 year ago Essential hypertension   [facility], Lilia Argue, MD      Future Appointments            In 1 month Rutherford Guys, MD Primary Care at [place], [facility]

## 2019-08-06 ENCOUNTER — Telehealth: Payer: Self-pay | Admitting: Family Medicine

## 2019-08-06 NOTE — Telephone Encounter (Signed)
06/16/19 Pt was last seen.  Please advise in regards to new medication for hand and leg pain.

## 2019-08-06 NOTE — Telephone Encounter (Signed)
Copied from [place] [id number]. Topic: General - Other >> Feb 11, 2019  9:15 AM Antonieta Iba C wrote: Reason for CRM: pt called in to ask if provider could prescribe her something for her hand and leg pain? Pt says that she knows that the pain is in result to her chemo    [id number] [place], [place] - [address]. AT [address] [place] [phone] (Phone) [phone] (Fax)

## 2019-08-06 NOTE — Telephone Encounter (Addendum)
Pt called with her new insurance-[employer] ID [id number]. Phone [phone] She said her CT scan WITHOUT contrast may need to be pre auth .

## 2019-08-07 NOTE — Telephone Encounter (Signed)
Please schedule patient - virtual is preferred. thanks

## 2019-08-08 NOTE — Telephone Encounter (Signed)
LVMTCB and scheduled telemed

## 2019-08-09 ENCOUNTER — Emergency Department (HOSPITAL_COMMUNITY)
Admission: EM | Admit: 2019-08-09 | Discharge: 2019-08-09 | Disposition: A | Discharge status: Home / Self Care | Payer: Medicare PPO | Attending: Emergency Medicine | Admitting: Emergency Medicine

## 2019-08-09 ENCOUNTER — Other Ambulatory Visit: Payer: Self-pay

## 2019-08-09 ENCOUNTER — Emergency Department (HOSPITAL_COMMUNITY): Payer: Medicare PPO

## 2019-08-09 ENCOUNTER — Encounter (HOSPITAL_COMMUNITY): Payer: Self-pay | Admitting: Emergency Medicine

## 2019-08-09 ENCOUNTER — Encounter: Payer: Self-pay | Admitting: Internal Medicine

## 2019-08-09 DIAGNOSIS — N183 Chronic kidney disease, stage 3 unspecified: Secondary | ICD-10-CM | POA: Insufficient documentation

## 2019-08-09 DIAGNOSIS — Z79899 Other long term (current) drug therapy: Secondary | ICD-10-CM | POA: Insufficient documentation

## 2019-08-09 DIAGNOSIS — R221 Localized swelling, mass and lump, neck: Secondary | ICD-10-CM | POA: Diagnosis not present

## 2019-08-09 DIAGNOSIS — C3490 Malignant neoplasm of unspecified part of unspecified bronchus or lung: Secondary | ICD-10-CM | POA: Diagnosis not present

## 2019-08-09 DIAGNOSIS — I129 Hypertensive chronic kidney disease with stage 1 through stage 4 chronic kidney disease, or unspecified chronic kidney disease: Secondary | ICD-10-CM | POA: Diagnosis not present

## 2019-08-09 DIAGNOSIS — M542 Cervicalgia: Secondary | ICD-10-CM | POA: Diagnosis not present

## 2019-08-09 DIAGNOSIS — I82890 Acute embolism and thrombosis of other specified veins: Secondary | ICD-10-CM

## 2019-08-09 DIAGNOSIS — I82C11 Acute embolism and thrombosis of right internal jugular vein: Secondary | ICD-10-CM | POA: Insufficient documentation

## 2019-08-09 DIAGNOSIS — Z95828 Presence of other vascular implants and grafts: Secondary | ICD-10-CM

## 2019-08-09 DIAGNOSIS — Z959 Presence of cardiac and vascular implant and graft, unspecified: Secondary | ICD-10-CM | POA: Diagnosis not present

## 2019-08-09 LAB — CBC WITH DIFFERENTIAL/PLATELET
Abs Immature Granulocytes: 0.02 10*3/uL (ref 0.00–0.07)
Basophils Absolute: 0 10*3/uL (ref 0.0–0.1)
Basophils Relative: 0 %
Eosinophils Absolute: 0.2 10*3/uL (ref 0.0–0.5)
Eosinophils Relative: 4 %
HCT: 30.9 % — ABNORMAL LOW (ref 36.0–46.0)
Hemoglobin: 9.6 g/dL — ABNORMAL LOW (ref 12.0–15.0)
Immature Granulocytes: 0 %
Lymphocytes Relative: 10 %
Lymphs Abs: 0.6 10*3/uL — ABNORMAL LOW (ref 0.7–4.0)
MCH: 34.7 pg — ABNORMAL HIGH (ref 26.0–34.0)
MCHC: 31.1 g/dL (ref 30.0–36.0)
MCV: 111.6 fL — ABNORMAL HIGH (ref 80.0–100.0)
Monocytes Absolute: 0.5 10*3/uL (ref 0.1–1.0)
Monocytes Relative: 9 %
Neutro Abs: 4.3 10*3/uL (ref 1.7–7.7)
Neutrophils Relative %: 77 %
Platelets: 168 10*3/uL (ref 150–400)
RBC: 2.77 MIL/uL — ABNORMAL LOW (ref 3.87–5.11)
RDW: 13.5 % (ref 11.5–15.5)
WBC: 5.7 10*3/uL (ref 4.0–10.5)
nRBC: 0 % (ref 0.0–0.2)

## 2019-08-09 LAB — BASIC METABOLIC PANEL
Anion gap: 10 (ref 5–15)
BUN: 26 mg/dL — ABNORMAL HIGH (ref 8–23)
CO2: 25 mmol/L (ref 22–32)
Calcium: 8.4 mg/dL — ABNORMAL LOW (ref 8.9–10.3)
Chloride: 104 mmol/L (ref 98–111)
Creatinine, Ser: 1.57 mg/dL — ABNORMAL HIGH (ref 0.44–1.00)
GFR calc Af Amer: 39 mL/min — ABNORMAL LOW (ref >60)
GFR calc non Af Amer: 34 mL/min — ABNORMAL LOW (ref >60)
Glucose, Bld: 100 mg/dL — ABNORMAL HIGH (ref 70–99)
Potassium: 4.3 mmol/L (ref 3.5–5.1)
Sodium: 139 mmol/L (ref 135–145)

## 2019-08-09 MED ORDER — RIVAROXABAN 15 MG PO TABS
15.0000 mg | ORAL_TABLET | Freq: Once | ORAL | Status: AC
  Administered 2019-08-09: 10:00:00 15 mg via ORAL
  Filled 2019-08-09: qty 1

## 2019-08-09 MED ORDER — HYDROCODONE-ACETAMINOPHEN 5-325 MG PO TABS
1.0000 | ORAL_TABLET | Freq: Once | ORAL | Status: AC
  Administered 2019-08-09: 1 via ORAL
  Filled 2019-08-09: qty 1

## 2019-08-09 MED ORDER — RIVAROXABAN (XARELTO) VTE STARTER PACK (15 & 20 MG): ORAL_TABLET | ORAL | 0 refills | Status: DC

## 2019-08-09 MED ORDER — RIVAROXABAN (XARELTO) EDUCATION KIT FOR DVT/PE PATIENTS
PACK | Freq: Once | Status: AC
  Filled 2019-08-09: qty 1

## 2019-08-09 NOTE — ED Provider Notes (Signed)
Medical screening examination/treatment/procedure(s) were conducted as a shared visit with non-physician practitioner(s) and myself.  I personally evaluated the patient during the encounter.  Patient here with fullness and pain to her right neck.  Patient states that she has not endarterectomy over a year ago and has a port placed secondary to her history of cancer.  She states that she noticed recently she is having worsening pain in the right side of her neck especially with palpation and certain positions.  She feels it is a bit fuller than previously as well this is just medial to the area where her port goes over her clavicle. No carotid bruits.  She has some tenderness and fullness that could be lymphadenopathy.  Low suspicion for abscess or infection.  Low suspicion for vascular etiology.  Will obtain CT of her neck to evaluate further.  She is scheduled to get a CT of her chest from her oncologist soon so we will just go and do that now so she does not have to receive radiation multiple times.   Ladislaus Repsher, Corene Cornea, MD 08/10/19 [phone]

## 2019-08-09 NOTE — ED Triage Notes (Signed)
Patient reports "soreness" at right lower/lateral neck onset this week , patient suspects irritation at tip of her porta-cath , no swelling /denies fever or chills .

## 2019-08-09 NOTE — ED Provider Notes (Signed)
Care assumed at shift change from [place], PA-C, pending CT neck and chest. See her note for full HPI and workup. Briefly, pt w hx lung cancer, last chemo in June 2020. Right port in place, though does flush it regularly. Since yesterday, has had pain with palpation to the right neck around port, some fullness. Seen by Dr, Dayna Barker as well. CT neck wo contrast (concern for early CKD). CT chest for convenience with future appt for staging.  Physical Exam  BP (!) 166/107 Comment: pt has not yet had her BP meds today  Pulse 96   Temp 98 F (36.7 C) (Oral)   Resp 18   SpO2 95%   Physical Exam Vitals and nursing note reviewed.  Constitutional:      Appearance: She is well-developed.  HENT:     Head: Normocephalic and atraumatic.  Eyes:     Conjunctiva/sclera: Conjunctivae normal.  Cardiovascular:     Comments: Right tunneled Port-A-Cath in place.  No skin changes.  Tenderness to the right base of the neck overlying the tunnel Port-A-Cath. Pulmonary:     Effort: Pulmonary effort is normal.  Neurological:     Mental Status: She is alert.  Psychiatric:        Mood and Affect: Mood normal.        Behavior: Behavior normal.    Results for orders placed or performed during the hospital encounter of 08/09/19  CBC with Differential  Result Value Ref Range   WBC 5.7 4.0 - 10.5 K/uL   RBC 2.77 (L) 3.87 - 5.11 MIL/uL   Hemoglobin 9.6 (L) 12.0 - 15.0 g/dL   HCT 30.9 (L) 36.0 - 46.0 %   MCV 111.6 (H) 80.0 - 100.0 fL   MCH 34.7 (H) 26.0 - 34.0 pg   MCHC 31.1 30.0 - 36.0 g/dL   RDW 13.5 11.5 - 15.5 %   Platelets 168 150 - 400 K/uL   nRBC 0.0 0.0 - 0.2 %   Neutrophils Relative % 77 %   Neutro Abs 4.3 1.7 - 7.7 K/uL   Lymphocytes Relative 10 %   Lymphs Abs 0.6 (L) 0.7 - 4.0 K/uL   Monocytes Relative 9 %   Monocytes Absolute 0.5 0.1 - 1.0 K/uL   Eosinophils Relative 4 %   Eosinophils Absolute 0.2 0.0 - 0.5 K/uL   Basophils Relative 0 %   Basophils Absolute 0.0 0.0 - 0.1 K/uL   Immature  Granulocytes 0 %   Abs Immature Granulocytes 0.02 0.00 - 0.07 K/uL  Basic metabolic panel  Result Value Ref Range   Sodium 139 135 - 145 mmol/L   Potassium 4.3 3.5 - 5.1 mmol/L   Chloride 104 98 - 111 mmol/L   CO2 25 22 - 32 mmol/L   Glucose, Bld 100 (H) 70 - 99 mg/dL   BUN 26 (H) 8 - 23 mg/dL   Creatinine, Ser 1.57 (H) 0.44 - 1.00 mg/dL   Calcium 8.4 (L) 8.9 - 10.3 mg/dL   GFR calc non Af Amer 34 (L) >60 mL/min   GFR calc Af Amer 39 (L) >60 mL/min   Anion gap 10 5 - 15   CT Soft Tissue Neck Wo Contrast  Result Date: 08/09/2019 CLINICAL DATA:  Right neck pain and swelling. History of lung cancer. Patient refused intravenous contrast. EXAM: CT NECK WITHOUT CONTRAST TECHNIQUE: Multidetector CT imaging of the neck was performed following the standard protocol without intravenous contrast. COMPARISON:  CT angio head and neck 06/22/2018 FINDINGS: Pharynx and larynx:  Normal. No mass or swelling. Salivary glands: No inflammation, mass, or stone. Thyroid: Negative Lymph nodes: No enlarged lymph nodes in the neck. Vascular: Right jugular Port-A-Cath. Limited vascular evaluation without intravenous contrast. There is increased density within the jugular vein above the catheter with surrounding edema in the soft tissues. Findings are consistent with jugular vein thrombosis on the right. Left jugular vein normal. Tortuous internal carotid artery bilaterally with retropharyngeal location bilaterally. Limited intracranial: Generalized atrophy without acute abnormality. Visualized orbits: Negative Mastoids and visualized paranasal sinuses: Negative Skeleton: Cervical spondylosis.  No acute skeletal abnormality. Upper chest: Chest CT results reported separately from today Other: None IMPRESSION: The patient refused intravenous contrast limiting evaluation. Right jugular Port-A-Cath. There is right jugular vein thrombosis above the Port-A-Cath insertion site. Negative for mass or adenopathy in the neck.  Electronically Signed   By: Franchot Gallo M.D.   On: 08/09/2019 07:45   CT Chest Wo Contrast  Result Date: 08/09/2019 CLINICAL DATA:  Lung cancer, right neck pain EXAM: CT CHEST WITHOUT CONTRAST TECHNIQUE: Multidetector CT imaging of the chest was performed following the standard protocol without IV contrast. COMPARISON:  CT chest dated 05/27/2019 FINDINGS: Cardiovascular: The heart is top-normal in size. Trace pericardial effusion. Chronic ectasia of the transverse aorta, measuring 4.0 cm, reflecting a ductus diverticulum versus pseudoaneurysm. Atherosclerotic calcifications of the aortic arch. Three vessel coronary atherosclerosis. Right chest port terminates at the cavoatrial junction. Mediastinum/Nodes: Thoracic lymphadenopathy, including a dominant 3.3 cm low right paratracheal node (series 3/image 42) and 13 mm short axis subcarinal node, previously 2.8 cm and 9 mm respectively. Visualized thyroid is unremarkable. Lungs/Pleura: Radiation changes in the medial right hemithorax. Underlying 2.1 x 2.7 cm nodule in the anterior right lower lobe (series 4/image 78), difficult to discretely measure, previously 2.2 x 2.4 cm. Surrounding patchy opacity with progressive patchy/nodular opacity posteriorly in the right lower lobe (series 4/image 64), favoring progressive radiation changes. Small right pleural effusion, increased. 4 mm subpleural nodule in the posterior left lower lobe (series 4/image 100), of questionable clinical significance. No pneumothorax. Upper Abdomen: Visualized upper abdomen is notable for a 12 mm left adrenal nodule (previously characterized as a benign adrenal adenoma) and vascular calcifications. Musculoskeletal: Sclerosis along the right lamina/posterior elements at T5 and T6 (sagittal image 86), more conspicuous than on the prior, suspicious for osseous metastases. Mild degenerative changes of the lower thoracic spine. IMPRESSION: 2.7 cm anterior right lower lobe nodule, mildly  progressive, corresponding to the patient's known primary bronchogenic neoplasm. Progressive surrounding radiation changes. Mildly progressive thoracic lymphadenopathy. Small right pleural effusion, mildly increased. Sclerotic lesions involving the right lamina/posterior elements at T5-6, more conspicuous than on the prior, suspicious for osseous metastases. Dedicated CT neck reported separately. Aortic Atherosclerosis (ICD10-I70.0). Electronically Signed   By: Julian Hy M.D.   On: 08/09/2019 08:08    ED Course/Procedures   Clinical Course as of Aug 08 1138  Sat Aug 09, 2019  0833 Pt evaluated. Discussed CT findings and consult to vascular surgery.    [JR]  0840 Discussed with Dr. Oneida Alar with vascular surgery.  Recommends speak with oncologist regarding future intended use of Port-A-Cath.   [JR]  [id number] Patient discussed with Dr. Lindi Adie with oncology.  Recommends discharge with Xarelto and patient will have close follow-up with Dr. Earlie Server next week.  Also discussed CT chest done here, which will also be followed by oncology.   [JR]    Clinical Course User Index [JR] Lumi Winslett, [name] N, PA-C    Procedures  MDM  CT neck with evidence of right IJV thrombosis above the Port-A-Cath insertion.  Discussed with vascular surgery and oncology.  Recommend Xarelto and close follow-up with Dr. Julien Nordmann regarding plan/intended future use of Port-A-Cath.  Oncology also made aware of CT chest findings and will follow up this coming week outpatient.  Patient will be started on Xarelto and is aware of need for close outpatient follow-up with her oncologist.  She is agreeable to plan and safe for discharge.       Chioke Noxon, [name] N, PA-C 08/09/19 1142    Mesner, Corene Cornea, MD 08/10/19 [phone]

## 2019-08-09 NOTE — Discharge Instructions (Addendum)
You have been given your first dose of xarelto today. Begin taking this evening, as directed. It is important that you not miss any doses. Discontinue aspirin. Follow up closely with Dr. Julien Nordmann. Call his office on Monday for information/scheduling of your appointment. Return to the ER if you develop sudden shortness of breath, stroke sx, or new or concerning symptoms.

## 2019-08-09 NOTE — ED Provider Notes (Signed)
[place] EMERGENCY DEPARTMENT Provider Note   CSN: [id number] Arrival date & time: 08/09/19  8546     History Chief Complaint  Patient presents with  . Neck Pain    [name] is a 69 y.o. female.  Patient to ED with complaint of right sided neck pain that started yesterday. She has a history of lung CA with port-a-cath in right chest, last chemo infusion was June of 2020. She does do maintenance flushes and has not found any resistance with flushes. She states she feels something in her neck that is tender and she is worried there is something wrong with her cath placement. No fever, redness or swelling of the neck. No chest pain, SOB, cough.   The history is provided by the patient. No language interpreter was used.  Neck Pain Associated symptoms: no chest pain, no fever and no weakness        Past Medical History:  Diagnosis Date  . Arthritis   . Chronic pain   . Hyperlipidemia   . Hypertension   . Low blood magnesium 10/04/2018  . lung ca dx'd 06/2018  . Stroke [facility])    06/19/2018 minor    Patient Active Problem List   Diagnosis Date Noted  . Port-A-Cath in place 04/08/2019  . CKD (chronic kidney disease) stage 3, GFR 30-59 ml/min 03/06/2019  . Neuropathy 03/06/2019  . Megaloblastic anemia 03/06/2019  . GIB (gastrointestinal bleeding) 10/30/2018  . Symptomatic anemia 10/30/2018  . Hypotension 10/30/2018  . Hypokalemia 10/30/2018  . Acute renal failure superimposed on stage 3 chronic kidney disease ([place]) 10/30/2018  . Pancytopenia ([place]) 10/30/2018  . Nausea vomiting and diarrhea 10/30/2018  . PUD (peptic ulcer disease) 10/30/2018  . Sepsis ([place]) 10/30/2018  . Hypomagnesemia 09/24/2018  . Small cell carcinoma of lower lobe of right lung ([place]) 08/28/2018  . Encounter for antineoplastic chemotherapy 08/28/2018  . Goals of care, counseling/discussion 08/28/2018  . Right lower lobe lung mass   . Acute CVA (cerebrovascular accident)  ([place]) 06/21/2018  . Acute blood loss anemia 06/21/2018  . Upper GI bleed 06/19/2018  . Psoriasis 12/14/2017  . Knee pain, right 12/14/2017  . BMI 32.0-32.9,adult 12/15/2011  . Underinsured 12/15/2011  . HLD (hyperlipidemia) 03/26/2007  . Essential hypertension 03/26/2007    Past Surgical History:  Procedure Laterality Date  . BIOPSY  06/19/2018   Procedure: BIOPSY;  Surgeon: Laurence Spates, MD;  Location: [facility] ENDOSCOPY;  Service: Endoscopy;;  . BIOPSY  10/11/2018   Procedure: BIOPSY;  Surgeon: Laurence Spates, MD;  Location: [facility] ENDOSCOPY;  Service: Endoscopy;;  . ENDARTERECTOMY Right 06/26/2018   Procedure: ENDARTERECTOMY CAROTID RIGHT;  Surgeon: Serafina Mitchell, MD;  Location: [facility] OR;  Service: Vascular;  Laterality: Right;  . ESOPHAGOGASTRODUODENOSCOPY (EGD) WITH PROPOFOL N/A 06/19/2018   Procedure: ESOPHAGOGASTRODUODENOSCOPY (EGD) WITH PROPOFOL;  Surgeon: Laurence Spates, MD;  Location: [facility] ENDOSCOPY;  Service: Endoscopy;  Laterality: N/A;  . FLEXIBLE SIGMOIDOSCOPY N/A 10/11/2018   Procedure: FLEXIBLE SIGMOIDOSCOPY;  Surgeon: Laurence Spates, MD;  Location: [facility] ENDOSCOPY;  Service: Endoscopy;  Laterality: N/A;  . IR IMAGING GUIDED PORT INSERTION  10/07/2018  . PATCH ANGIOPLASTY Right 06/26/2018   Procedure: PATCH ANGIOPLASTY USING A XENOSURE 1cm x 6cm BIOLOGIC PATCH;  Surgeon: Serafina Mitchell, MD;  Location: [place];  Service: Vascular;  Laterality: Right;  Marland Kitchen VIDEO BRONCHOSCOPY WITH ENDOBRONCHIAL ULTRASOUND N/A 08/14/2018   Procedure: VIDEO BRONCHOSCOPY WITH ENDOBRONCHIAL ULTRASOUND;  Surgeon: Collene Gobble, MD;  Location: [place];  Service: Thoracic;  Laterality: N/A;     OB History   No obstetric history on file.     Family History  Problem Relation Age of Onset  . Cancer Mother        lung cancer  . Diabetes Other   . Hyperlipidemia Other   . Hypertension Other   . Cancer Other     Social History   Tobacco Use  . Smoking status: Former Smoker    Packs/day: 1.00    Years:  10.00    Pack years: 10.00    Types: Cigarettes    Quit date: 06/19/2018    Years since quitting: 1.1  . Smokeless tobacco: Never Used  . Tobacco comment: Currently using Nicorette Gum  Substance Use Topics  . Alcohol use: Yes    Comment: wine 2 glasses a day   . Drug use: Never    Comment: Hemp Oil    Home Medications Prior to Admission medications   Medication Sig Start Date End Date Taking? Authorizing Provider  acetaminophen (TYLENOL) 650 MG CR tablet Take 650 mg by mouth every 8 (eight) hours as needed for pain.    [provider]  amLODipine (NORVASC) 10 MG tablet TAKE 1 TABLET BY MOUTH EVERY DAY 05/02/19   Rutherford Guys, MD  atorvastatin (LIPITOR) 80 MG tablet TAKE 1 TABLET (80 MG TOTAL) BY MOUTH DAILY AT 6 PM. 07/24/19   Rutherford Guys, MD  Clobetasol Prop Emollient Base (CLOBETASOL PROPIONATE E) 0.05 % emollient cream Apply 1 application topically 2 (two) times daily. 06/24/19   Rutherford Guys, MD  DULoxetine (CYMBALTA) 30 MG capsule TAKE 1 CAPSULE BY MOUTH EVERY DAY 04/25/19   Rutherford Guys, MD  feeding supplement, ENSURE ENLIVE, (ENSURE ENLIVE) LIQD Take 237 mLs by mouth 2 (two) times daily between meals. 11/03/18   Mikhail, Velta Addison, DO  lisinopril (ZESTRIL) 40 MG tablet TAKE 1 TABLET BY MOUTH EVERY DAY 08/05/19   Rutherford Guys, MD  pregabalin (LYRICA) 50 MG capsule TAKE 1 CAPSULE BY MOUTH 3 TIMES A DAY 07/15/19   Rutherford Guys, MD  prochlorperazine (COMPAZINE) 10 MG tablet TAKE 1 TABLET (10 MG TOTAL) BY MOUTH EVERY 6 (SIX) HOURS AS NEEDED FOR NAUSEA OR VOMITING. 03/07/19   [provider]  traMADol (ULTRAM) 50 MG tablet Take 1-2 tablets (50-100 mg total) by mouth at bedtime as needed. 06/05/19   Rutherford Guys, MD    Allergies    Patient has no known allergies.  Review of Systems   Review of Systems  Constitutional: Negative for fever.  Respiratory: Negative for shortness of breath.   Cardiovascular: Negative for chest pain.    Gastrointestinal: Negative for nausea and vomiting.  Musculoskeletal: Positive for neck pain.  Neurological: Negative for weakness.    Physical Exam Updated Vital Signs BP (!) 166/107 Comment: pt has not yet had her BP meds today  Pulse 96   Temp 98 F (36.7 C) (Oral)   Resp 20   SpO2 95%   Physical Exam Vitals and nursing note reviewed.  Constitutional:      Appearance: She is well-developed.  HENT:     Head: Normocephalic.  Neck:     Comments: Right neck well healed surgical incision after carotid endarterectomy. There is a hard area palpable in the right neck just above clavicle, likely related to her port, however, no redness or swelling. There is fullness without fluctuance or induration in the surrounding area. No carotid bruit. Cardiovascular:  Rate and Rhythm: Normal rate and regular rhythm.  Pulmonary:     Effort: Pulmonary effort is normal.     Breath sounds: Normal breath sounds. No wheezing, rhonchi or rales.  Chest:     Chest wall: No tenderness.  Abdominal:     General: Bowel sounds are normal.     Palpations: Abdomen is soft.     Tenderness: There is no abdominal tenderness. There is no guarding or rebound.  Musculoskeletal:        General: Normal range of motion.     Cervical back: Normal range of motion and neck supple.  Skin:    General: Skin is warm and dry.     Findings: No erythema.  Neurological:     Mental Status: She is alert and oriented to person, place, and time.     ED Results / Procedures / Treatments   Labs (all labs ordered are listed, but only abnormal results are displayed) Labs Reviewed - No data to display  EKG None  Radiology No results found.  Procedures Procedures (including critical care time)  Medications Ordered in ED Medications  HYDROcodone-acetaminophen (NORCO/VICODIN) 5-325 MG per tablet 1 tablet (1 tablet Oral Given 08/09/19 0488)    ED Course  I have reviewed the triage vital signs and the nursing  notes.  Pertinent labs & imaging results that were available during my care of the patient were reviewed by me and considered in my medical decision making (see chart for details).    MDM Rules/Calculators/A&P                      Patient to ED with concerns detailed in the HPI.  She is very well appearing, VSS. She is seen by dr. Dayna Barker who does not feel strongly there is a concern for abscess or infection, however, given her CA history, consider lymph swelling. Will obtain basic labs, CT neck, which the patient insists is without contrast. She is scheduled for a CT chest by oncology for CA staging which we will go ahead and obtain today for her convenience.   Patient care signed out to [name] Robinson, PA-C, with studies pending.   Final Clinical Impression(s) / ED Diagnoses Final diagnoses:  None   1. Right side neck pain  Rx / DC Orders ED Discharge Orders    None       Charlann Lange, Hershal Coria 08/09/19 8916    Mesner, Corene Cornea, MD 08/09/19 [phone]

## 2019-08-11 ENCOUNTER — Telehealth: Payer: Self-pay | Admitting: Medical Oncology

## 2019-08-11 NOTE — Telephone Encounter (Addendum)
Emergency CT scan done >" there is something wrong with my port.  Next appt 2/8

## 2019-08-11 NOTE — Telephone Encounter (Signed)
We can start her on Xarelto starter kit for the questionable thrombosis around the Port-A-Cath.  Thank you.

## 2019-08-11 NOTE — Telephone Encounter (Signed)
Pt started xarelto yesterday. Lab and port flushappt r/s to 2/10

## 2019-08-13 ENCOUNTER — Telehealth: Payer: Self-pay | Admitting: Internal Medicine

## 2019-08-13 NOTE — Telephone Encounter (Signed)
Patient said she did not need lab 2/10

## 2019-08-19 ENCOUNTER — Telehealth: Payer: Self-pay | Admitting: Medical Oncology

## 2019-08-19 NOTE — Telephone Encounter (Signed)
Will Dr Julien Nordmann refill Xarelto? Probably ,but discuss at next appt.

## 2019-08-24 ENCOUNTER — Ambulatory Visit: Payer: Medicare PPO | Attending: Internal Medicine

## 2019-08-24 DIAGNOSIS — Z23 Encounter for immunization: Secondary | ICD-10-CM

## 2019-08-24 NOTE — Progress Notes (Signed)
   Covid-19 Vaccination Clinic  Name:  GABRYELLE WHITMOYER    MRN: [id number] DOB: November 11, 1950  08/24/2019  Ms. Taubman was observed post Covid-19 immunization for 15 minutes without incidence. She was provided with Vaccine Information Sheet and instruction to access the V-Safe system.   Ms. Calder was instructed to call 911 with any severe reactions post vaccine: Marland Kitchen Difficulty breathing  . Swelling of your face and throat  . A fast heartbeat  . A bad rash all over your body  . Dizziness and weakness    Immunizations Administered    Name Date Dose VIS Date Route   Pfizer COVID-19 Vaccine 08/24/2019  4:21 PM 0.3 mL 06/27/2019 Intramuscular   Manufacturer: [place]   Lot: [id number]   [place]: [address]-4

## 2019-08-25 ENCOUNTER — Other Ambulatory Visit: Payer: Medicare Other

## 2019-08-26 ENCOUNTER — Ambulatory Visit (HOSPITAL_COMMUNITY): Payer: Medicare Other

## 2019-08-27 ENCOUNTER — Inpatient Hospital Stay: Payer: Medicare PPO

## 2019-08-27 ENCOUNTER — Encounter: Payer: Self-pay | Admitting: Internal Medicine

## 2019-08-27 ENCOUNTER — Other Ambulatory Visit: Payer: Self-pay

## 2019-08-27 ENCOUNTER — Other Ambulatory Visit: Payer: Medicare PPO

## 2019-08-27 ENCOUNTER — Inpatient Hospital Stay: Payer: Medicare PPO | Attending: Internal Medicine | Admitting: Internal Medicine

## 2019-08-27 VITALS — BP 140/90 | HR 102 | Temp 97.6°F | Resp 17 | Ht 66.0 in | Wt 195.3 lb

## 2019-08-27 DIAGNOSIS — M1990 Unspecified osteoarthritis, unspecified site: Secondary | ICD-10-CM | POA: Insufficient documentation

## 2019-08-27 DIAGNOSIS — Z9221 Personal history of antineoplastic chemotherapy: Secondary | ICD-10-CM | POA: Diagnosis not present

## 2019-08-27 DIAGNOSIS — Z7982 Long term (current) use of aspirin: Secondary | ICD-10-CM | POA: Diagnosis not present

## 2019-08-27 DIAGNOSIS — Z923 Personal history of irradiation: Secondary | ICD-10-CM | POA: Diagnosis not present

## 2019-08-27 DIAGNOSIS — E785 Hyperlipidemia, unspecified: Secondary | ICD-10-CM | POA: Insufficient documentation

## 2019-08-27 DIAGNOSIS — C3431 Malignant neoplasm of lower lobe, right bronchus or lung: Secondary | ICD-10-CM

## 2019-08-27 DIAGNOSIS — Z79899 Other long term (current) drug therapy: Secondary | ICD-10-CM | POA: Insufficient documentation

## 2019-08-27 DIAGNOSIS — Z8673 Personal history of transient ischemic attack (TIA), and cerebral infarction without residual deficits: Secondary | ICD-10-CM | POA: Insufficient documentation

## 2019-08-27 DIAGNOSIS — Z7901 Long term (current) use of anticoagulants: Secondary | ICD-10-CM | POA: Diagnosis not present

## 2019-08-27 DIAGNOSIS — I10 Essential (primary) hypertension: Secondary | ICD-10-CM

## 2019-08-27 MED ORDER — RIVAROXABAN 20 MG PO TABS: 20.0000 mg | ORAL_TABLET | Freq: Every day | ORAL | 2 refills | Status: DC

## 2019-08-27 NOTE — Progress Notes (Signed)
[place] [phone]) [phone]   [phone]  OFFICE PROGRESS NOTE  Rutherford Guys, MD [address] [name] [address]  DIAGNOSIS: Limited stage (T2b, N2, M0) small cell lung cancer, pending further staging work-up diagnosed and January 2020.  She presented with right lower lobe lung mass in addition to right hilar and mediastinal lymphadenopathy.  PRIOR THERAPY: Systemic chemotherapy concurrent with radiation.  Chemotherapy withcisplatin 80 mg/M2 on day 1 and etoposide 100 mg/M2 on days 1, 2 and 3 every 3 weeks.First dose September 03, 2018. Status post5cycles.Dose reducedstarting fromcycle #4to cisplatin 40 mg/m2 and etoposide 80 mg/m2 due to renal insufficiency.  CURRENT THERAPY: Observation.   INTERVAL HISTORY: [name] 69 y.o. female returns to the clinic today for follow-up visit.  The patient is feeling fine today with no concerning complaints.  She denied having any recent chest pain, shortness of breath except with exertion with no cough or hemoptysis.  She denied having any fever or chills.  She has no nausea, vomiting, diarrhea or constipation.  She has no headache or visual changes.  The patient was seen at the emergency department in January 2021 with right-sided neck pain and she had CT of the neck and the chest performed at that time.  The CT of the neck showed right jugular vein thrombosis and the patient started treatment with Xarelto.  CT scan of the chest showed mild increase in some of the pulmonary nodules.  She is here today for evaluation and discussion of her scan results and recommendation regarding her condition.  MEDICAL HISTORY: Past Medical History:  Diagnosis Date  . Arthritis   . Chronic pain   . Hyperlipidemia   . Hypertension   . Low blood magnesium 10/04/2018  . lung ca dx'd 06/2018  . Stroke ([place])    06/19/2018 minor    ALLERGIES:  has No Known Allergies.  MEDICATIONS:  Current Outpatient  Medications  Medication Sig Dispense Refill  . acetaminophen (TYLENOL) 650 MG CR tablet Take 650 mg by mouth every 8 (eight) hours as needed for pain.    Marland Kitchen amLODipine (NORVASC) 10 MG tablet TAKE 1 TABLET BY MOUTH EVERY DAY 90 tablet 1  . aspirin 325 MG tablet Take 325 mg by mouth daily.    Marland Kitchen atorvastatin (LIPITOR) 80 MG tablet TAKE 1 TABLET (80 MG TOTAL) BY MOUTH DAILY AT 6 PM. 90 tablet 3  . Clobetasol Prop Emollient Base (CLOBETASOL PROPIONATE E) 0.05 % emollient cream Apply 1 application topically 2 (two) times daily. 30 g 3  . DULoxetine (CYMBALTA) 30 MG capsule TAKE 1 CAPSULE BY MOUTH EVERY DAY (Patient taking differently: Take 30 mg by mouth daily. ) 90 capsule 2  . lisinopril (ZESTRIL) 40 MG tablet TAKE 1 TABLET BY MOUTH EVERY DAY (Patient taking differently: Take 40 mg by mouth daily. ) 90 tablet 0  . ondansetron (ZOFRAN) 4 MG tablet Take 4 mg by mouth every 8 (eight) hours as needed for nausea or vomiting.    . pregabalin (LYRICA) 50 MG capsule TAKE 1 CAPSULE BY MOUTH 3 TIMES A DAY (Patient taking differently: Take 50 mg by mouth 3 (three) times daily. ) 90 capsule 3  . Rivaroxaban 15 & 20 MG TBPK Follow package directions: Take one 15mg  tablet by mouth twice a day. On day 22, switch to one 20mg  tablet once a day. Take with food. 51 each 0  . traMADol (ULTRAM) 50 MG tablet Take 1-2 tablets (50-100 mg total)  by mouth at bedtime as needed. (Patient taking differently: Take 50-100 mg by mouth at bedtime as needed for moderate pain. ) 30 tablet 1   No current facility-administered medications for this visit.    SURGICAL HISTORY:  Past Surgical History:  Procedure Laterality Date  . BIOPSY  06/19/2018   Procedure: BIOPSY;  Surgeon: Laurence Spates, MD;  Location: [facility];  Service: Endoscopy;;  . BIOPSY  10/11/2018   Procedure: BIOPSY;  Surgeon: Laurence Spates, MD;  Location: [facility];  Service: Endoscopy;;  . ENDARTERECTOMY Right 06/26/2018   Procedure: ENDARTERECTOMY CAROTID  RIGHT;  Surgeon: Serafina Mitchell, MD;  [facility];  Service: Vascular;  Laterality: Right;  . ESOPHAGOGASTRODUODENOSCOPY (EGD) WITH PROPOFOL N/A 06/19/2018   Procedure: ESOPHAGOGASTRODUODENOSCOPY (EGD) WITH PROPOFOL;  Surgeon: Laurence Spates, MD;  Location: [facility];  Service: Endoscopy;  Laterality: N/A;  . FLEXIBLE SIGMOIDOSCOPY N/A 10/11/2018   Procedure: FLEXIBLE SIGMOIDOSCOPY;  Surgeon: Laurence Spates, MD;  Location: [facility];  Service: Endoscopy;  Laterality: N/A;  . IR IMAGING GUIDED PORT INSERTION  10/07/2018  . PATCH ANGIOPLASTY Right 06/26/2018   Procedure: PATCH ANGIOPLASTY USING A XENOSURE 1cm x 6cm BIOLOGIC PATCH;  Surgeon: Serafina Mitchell, MD;  Location: [place];  Service: Vascular;  Laterality: Right;  Marland Kitchen VIDEO BRONCHOSCOPY WITH ENDOBRONCHIAL ULTRASOUND N/A 08/14/2018   Procedure: VIDEO BRONCHOSCOPY WITH ENDOBRONCHIAL ULTRASOUND;  Surgeon: Collene Gobble, MD;  Location: [facility] OR;  Service: Thoracic;  Laterality: N/A;    REVIEW OF SYSTEMS:  A comprehensive review of systems was negative except for: Constitutional: positive for fatigue Respiratory: positive for dyspnea on exertion   PHYSICAL EXAMINATION: General appearance: alert, cooperative and no distress Head: Normocephalic, without obvious abnormality, atraumatic Neck: no adenopathy, no JVD, supple, symmetrical, trachea midline and thyroid not enlarged, symmetric, no tenderness/mass/nodules Lymph nodes: Cervical, supraclavicular, and axillary nodes normal. Resp: clear to auscultation bilaterally Back: symmetric, no curvature. ROM normal. No CVA tenderness. Cardio: regular rate and rhythm, S1, S2 normal, no murmur, click, rub or gallop GI: soft, non-tender; bowel sounds normal; no masses,  no organomegaly Extremities: extremities normal, atraumatic, no cyanosis or edema  ECOG PERFORMANCE STATUS: 1 - Symptomatic but completely ambulatory  Blood pressure 140/90, pulse (!) 102, temperature 97.6 F (36.4 C), temperature  source Temporal, resp. rate 17, height 5\' 6"  (2.353 m), weight 195 lb 4.8 oz (88.6 kg), SpO2 98 %.  LABORATORY DATA: Lab Results  Component Value Date   WBC 5.7 08/09/2019   HGB 9.6 (L) 08/09/2019   HCT 30.9 (L) 08/09/2019   MCV 111.6 (H) 08/09/2019   PLT 168 08/09/2019      Chemistry      Component Value Date/Time   NA 139 08/09/2019 0647   NA 142 08/02/2018 1042   K 4.3 08/09/2019 0647   CL 104 08/09/2019 0647   CO2 25 08/09/2019 0647   BUN 26 (H) 08/09/2019 0647   BUN 9 08/02/2018 1042   CREATININE 1.57 (H) 08/09/2019 0647   CREATININE 1.39 (H) 05/27/2019 0814      Component Value Date/Time   CALCIUM 8.4 (L) 08/09/2019 0647   ALKPHOS 67 05/27/2019 0814   AST 25 05/27/2019 0814   ALT 25 05/27/2019 0814   BILITOT 0.2 (L) 05/27/2019 0814       RADIOGRAPHIC STUDIES: CT Soft Tissue Neck Wo Contrast  Result Date: 08/09/2019 CLINICAL DATA:  Right neck pain and swelling. History of lung cancer. Patient refused intravenous contrast. EXAM: CT NECK WITHOUT CONTRAST TECHNIQUE: Multidetector CT imaging of  the neck was performed following the standard protocol without intravenous contrast. COMPARISON:  CT angio head and neck 06/22/2018 FINDINGS: Pharynx and larynx: Normal. No mass or swelling. Salivary glands: No inflammation, mass, or stone. Thyroid: Negative Lymph nodes: No enlarged lymph nodes in the neck. Vascular: Right jugular Port-A-Cath. Limited vascular evaluation without intravenous contrast. There is increased density within the jugular vein above the catheter with surrounding edema in the soft tissues. Findings are consistent with jugular vein thrombosis on the right. Left jugular vein normal. Tortuous internal carotid artery bilaterally with retropharyngeal location bilaterally. Limited intracranial: Generalized atrophy without acute abnormality. Visualized orbits: Negative Mastoids and visualized paranasal sinuses: Negative Skeleton: Cervical spondylosis.  No acute skeletal  abnormality. Upper chest: Chest CT results reported separately from today Other: None IMPRESSION: The patient refused intravenous contrast limiting evaluation. Right jugular Port-A-Cath. There is right jugular vein thrombosis above the Port-A-Cath insertion site. Negative for mass or adenopathy in the neck. Electronically Signed   By: Franchot Gallo M.D.   On: 08/09/2019 07:45   CT Chest Wo Contrast  Result Date: 08/09/2019 CLINICAL DATA:  Lung cancer, right neck pain EXAM: CT CHEST WITHOUT CONTRAST TECHNIQUE: Multidetector CT imaging of the chest was performed following the standard protocol without IV contrast. COMPARISON:  CT chest dated 05/27/2019 FINDINGS: Cardiovascular: The heart is top-normal in size. Trace pericardial effusion. Chronic ectasia of the transverse aorta, measuring 4.0 cm, reflecting a ductus diverticulum versus pseudoaneurysm. Atherosclerotic calcifications of the aortic arch. Three vessel coronary atherosclerosis. Right chest port terminates at the cavoatrial junction. Mediastinum/Nodes: Thoracic lymphadenopathy, including a dominant 3.3 cm low right paratracheal node (series 3/image 42) and 13 mm short axis subcarinal node, previously 2.8 cm and 9 mm respectively. Visualized thyroid is unremarkable. Lungs/Pleura: Radiation changes in the medial right hemithorax. Underlying 2.1 x 2.7 cm nodule in the anterior right lower lobe (series 4/image 78), difficult to discretely measure, previously 2.2 x 2.4 cm. Surrounding patchy opacity with progressive patchy/nodular opacity posteriorly in the right lower lobe (series 4/image 64), favoring progressive radiation changes. Small right pleural effusion, increased. 4 mm subpleural nodule in the posterior left lower lobe (series 4/image 100), of questionable clinical significance. No pneumothorax. Upper Abdomen: Visualized upper abdomen is notable for a 12 mm left adrenal nodule (previously characterized as a benign adrenal adenoma) and vascular  calcifications. Musculoskeletal: Sclerosis along the right lamina/posterior elements at T5 and T6 (sagittal image 86), more conspicuous than on the prior, suspicious for osseous metastases. Mild degenerative changes of the lower thoracic spine. IMPRESSION: 2.7 cm anterior right lower lobe nodule, mildly progressive, corresponding to the patient's known primary bronchogenic neoplasm. Progressive surrounding radiation changes. Mildly progressive thoracic lymphadenopathy. Small right pleural effusion, mildly increased. Sclerotic lesions involving the right lamina/posterior elements at T5-6, more conspicuous than on the prior, suspicious for osseous metastases. Dedicated CT neck reported separately. Aortic Atherosclerosis (ICD10-I70.0). Electronically Signed   By: Julian Hy M.D.   On: 08/09/2019 08:08    ASSESSMENT AND PLAN: This is a very pleasant 69 years old white female with limited stage small cell lung cancer diagnosed in January 2020 status post systemic chemotherapy initially with cisplatin and etoposide status post 5 cycles.  Her dose of cisplatin and etoposide was significantly reduced starting from cycle #4 secondary to worsening renal insufficiency.  Her treatment was concurrent with radiotherapy. The patient is currently on observation and she is feeling fine. The patient is feeling fine today with no concerning complaints. The last CT scan of the chest showed  mild increase in some of the pulmonary nodules that need very close monitoring. I recommended for the patient to have repeat CT scan of the chest in 2 months for further evaluation of her disease. For the recent right jugular vein thrombosis, the patient will continue her current treatment with Xarelto and I give her a refill of her medication today. She was advised to call immediately if she has any concerning symptoms in the interval. The patient voices understanding of current disease status and treatment options and is in  agreement with the current care plan.  All questions were answered. The patient knows to call the clinic with any problems, questions or concerns. We can certainly see the patient much sooner if necessary.   Disclaimer: This note was dictated with voice recognition software. Similar sounding words can inadvertently be transcribed and may not be corrected upon review.

## 2019-08-28 ENCOUNTER — Telehealth: Payer: Self-pay | Admitting: Internal Medicine

## 2019-08-28 NOTE — Telephone Encounter (Signed)
Scheduled per los. Called and spoke with patient. Confirmed appts  

## 2019-08-29 ENCOUNTER — Telehealth: Payer: Self-pay | Admitting: Medical Oncology

## 2019-08-29 NOTE — Telephone Encounter (Signed)
How long does she need to be on Xarelto?

## 2019-09-02 ENCOUNTER — Telehealth: Payer: Self-pay | Admitting: *Deleted

## 2019-09-02 NOTE — Telephone Encounter (Signed)
Attempted to reach patient to advise about expected length of time she will need to be on Xarleto.  Left message, pending call back.   Per Cassandra Heilingoetter, PA patient should expect to be on Xarleto for blood clot for ATLEAST 6 months.  We will continue to reassess need at each visit and refill Rx as needed.

## 2019-09-03 ENCOUNTER — Telehealth: Payer: Self-pay | Admitting: Family Medicine

## 2019-09-03 NOTE — Telephone Encounter (Signed)
called pt LVM that this aptt would be telemed

## 2019-09-04 ENCOUNTER — Other Ambulatory Visit: Payer: Self-pay

## 2019-09-04 ENCOUNTER — Telehealth (INDEPENDENT_AMBULATORY_CARE_PROVIDER_SITE_OTHER): Payer: Medicare PPO | Admitting: Family Medicine

## 2019-09-04 DIAGNOSIS — E7800 Pure hypercholesterolemia, unspecified: Secondary | ICD-10-CM

## 2019-09-04 DIAGNOSIS — G629 Polyneuropathy, unspecified: Secondary | ICD-10-CM

## 2019-09-04 DIAGNOSIS — L409 Psoriasis, unspecified: Secondary | ICD-10-CM

## 2019-09-04 DIAGNOSIS — R197 Diarrhea, unspecified: Secondary | ICD-10-CM

## 2019-09-04 DIAGNOSIS — C3431 Malignant neoplasm of lower lobe, right bronchus or lung: Secondary | ICD-10-CM

## 2019-09-04 DIAGNOSIS — R112 Nausea with vomiting, unspecified: Secondary | ICD-10-CM

## 2019-09-04 DIAGNOSIS — N1832 Chronic kidney disease, stage 3b: Secondary | ICD-10-CM | POA: Diagnosis not present

## 2019-09-04 DIAGNOSIS — I10 Essential (primary) hypertension: Secondary | ICD-10-CM | POA: Diagnosis not present

## 2019-09-04 MED ORDER — ONDANSETRON HCL 4 MG PO TABS: 4.0000 mg | ORAL_TABLET | Freq: Three times a day (TID) | ORAL | 3 refills | Status: DC | PRN

## 2019-09-04 MED ORDER — CLOBETASOL PROP EMOLLIENT BASE 0.05 % EX CREA: 1.0000 "application " | TOPICAL_CREAM | Freq: Two times a day (BID) | CUTANEOUS | 3 refills | Status: AC

## 2019-09-04 MED ORDER — TRAMADOL HCL 50 MG PO TABS: 50.0000 mg | ORAL_TABLET | Freq: Every evening | ORAL | 1 refills | Status: DC | PRN

## 2019-09-04 NOTE — Progress Notes (Signed)
Pt is following up on htn, psoriasis and nueropathy. Reporting that she is using a new topical tx fro her skin condition she buys from [facility]. Says it has helped tremendously with the px, which she is very pleased. She is asking for a larger size ot the clobestasol. She is wanting to speak about the use of the lipitor. Says that Dr. Inda Merlin does not discuss lab with her. She wants to know if she really needs to continue on the med if her cholesterol has improved. Also, the pill is not pleasing to the stomach. Uses zofran for nausea. She received her first dose of the covid vaccine 2/7 receiving the second dose 3/4. Requesting refill on pended medication. Tramadol needed for back pain

## 2019-09-04 NOTE — Progress Notes (Signed)
Virtual Visit Note  I connected with patient on 09/04/19 at 903am by phone (landline) and verified that I am speaking with the correct person using two identifiers. [name] is currently located at home and patient is currently with them during visit. The provider, Rutherford Guys, MD is located in their office at time of visit.  I discussed the limitations, risks, security and privacy concerns of performing an evaluation and management service by telephone and the availability of in person appointments. I also discussed with the patient that there may be a patient responsible charge related to this service. The patient expressed understanding and agreed to proceed.   CC: routine followup  HPI ? 69 yo F with PMH of HTN, CKD3, CVA in 06/2018, HLP, lung cancer, UGIB with h/o transfusion, psoriasis,who presents today forroutine followup  Last OV Nov 2020 - no changes In Jan 2021 seen in ER for right sided neck pain that was related to jugular vein thrombosis and was started on xarelto Last Onc visit feb 2021 - ct chest showed mild increase in lung nodules, close monitoring, cont xarelto  Patient is overall doing well Her neuropathy is slowly improving - her right leg pain is improved, her hands are only numb in her fingertips, she is able to put on earrings on Takes cymbalta, lyrica, daily She takes tramadol prn for neuropathy or back pain pmp reviewed  Her psoriasis is sign improved, her plaques resolved 70% She has been alternating MG217 (coal tar) and clobetasol  She reports that she has to cut atorvastatin 80mg  in half as it is difficult to swallow and therefore it taste bad  She has received her first dose covid vaccine, other than sore arm she did well  She reports that she walking with higher BP in the morning and then BP is coming down nicely afterwards  Takes lisinopril and amlodipine in the morning  Lab Results  Component Value Date   CREATININE 1.57 (H)  08/09/2019   BUN 26 (H) 08/09/2019   NA 139 08/09/2019   K 4.3 08/09/2019   CL 104 08/09/2019   CO2 25 08/09/2019  GFR 34  Lab Results  Component Value Date   CHOL 181 08/02/2018   HDL 45 08/02/2018   LDLCALC 109 (H) 08/02/2018   TRIG 137 08/02/2018   CHOLHDL 4.0 08/02/2018    No Known Allergies  Prior to Admission medications   Medication Sig Start Date End Date Taking? Authorizing Provider  acetaminophen (TYLENOL) 650 MG CR tablet Take 650 mg by mouth every 8 (eight) hours as needed for pain.   Yes [provider]  amLODipine (NORVASC) 10 MG tablet TAKE 1 TABLET BY MOUTH EVERY DAY 05/02/19  Yes Rutherford Guys, MD  atorvastatin (LIPITOR) 80 MG tablet TAKE 1 TABLET (80 MG TOTAL) BY MOUTH DAILY AT 6 PM. 07/24/19  Yes Rutherford Guys, MD  Clobetasol Prop Emollient Base (CLOBETASOL PROPIONATE E) 0.05 % emollient cream Apply 1 application topically 2 (two) times daily. 06/24/19  Yes Rutherford Guys, MD  DULoxetine (CYMBALTA) 30 MG capsule TAKE 1 CAPSULE BY MOUTH EVERY DAY Patient taking differently: Take 30 mg by mouth daily.  04/25/19  Yes Rutherford Guys, MD  lisinopril (ZESTRIL) 40 MG tablet TAKE 1 TABLET BY MOUTH EVERY DAY Patient taking differently: Take 40 mg by mouth daily.  08/05/19  Yes Rutherford Guys, MD  ondansetron (ZOFRAN) 4 MG tablet Take 4 mg by mouth every 8 (eight) hours as needed  for nausea or vomiting.   Yes [provider]  pregabalin (LYRICA) 50 MG capsule TAKE 1 CAPSULE BY MOUTH 3 TIMES A DAY Patient taking differently: Take 50 mg by mouth 3 (three) times daily.  07/15/19  Yes Rutherford Guys, MD  rivaroxaban (XARELTO) 20 MG TABS tablet Take 1 tablet (20 mg total) by mouth daily with supper. 08/27/19  Yes Curt Bears, MD  traMADol (ULTRAM) 50 MG tablet Take 1-2 tablets (50-100 mg total) by mouth at bedtime as needed. Patient taking differently: Take 50-100 mg by mouth at bedtime as needed for moderate pain.  06/05/19  Yes Rutherford Guys,  MD    Past Medical History:  Diagnosis Date  . Arthritis   . Chronic pain   . Hyperlipidemia   . Hypertension   . Low blood magnesium 10/04/2018  . lung ca dx'd 06/2018  . Stroke ([place])    06/19/2018 minor    Past Surgical History:  Procedure Laterality Date  . BIOPSY  06/19/2018   Procedure: BIOPSY;  Surgeon: Laurence Spates, MD;  [facility];  Service: Endoscopy;;  . BIOPSY  10/11/2018   Procedure: BIOPSY;  Surgeon: Laurence Spates, MD;  Location: [facility];  Service: Endoscopy;;  . ENDARTERECTOMY Right 06/26/2018   Procedure: ENDARTERECTOMY CAROTID RIGHT;  Surgeon: Serafina Mitchell, MD;  [facility] OR;  Service: Vascular;  Laterality: Right;  . ESOPHAGOGASTRODUODENOSCOPY (EGD) WITH PROPOFOL N/A 06/19/2018   Procedure: ESOPHAGOGASTRODUODENOSCOPY (EGD) WITH PROPOFOL;  Surgeon: Laurence Spates, MD;  [facility];  Service: Endoscopy;  Laterality: N/A;  . FLEXIBLE SIGMOIDOSCOPY N/A 10/11/2018   Procedure: FLEXIBLE SIGMOIDOSCOPY;  Surgeon: Laurence Spates, MD;  [facility];  Service: Endoscopy;  Laterality: N/A;  . IR IMAGING GUIDED PORT INSERTION  10/07/2018  . PATCH ANGIOPLASTY Right 06/26/2018   Procedure: PATCH ANGIOPLASTY USING A XENOSURE 1cm x 6cm BIOLOGIC PATCH;  Surgeon: Serafina Mitchell, MD;  Location: [place];  Service: Vascular;  Laterality: Right;  Marland Kitchen VIDEO BRONCHOSCOPY WITH ENDOBRONCHIAL ULTRASOUND N/A 08/14/2018   Procedure: VIDEO BRONCHOSCOPY WITH ENDOBRONCHIAL ULTRASOUND;  Surgeon: Collene Gobble, MD;  Location: [facility];  Service: Thoracic;  Laterality: N/A;    Social History   Tobacco Use  . Smoking status: Former Smoker    Packs/day: 1.00    Years: 10.00    Pack years: 10.00    Types: Cigarettes    Quit date: 06/19/2018    Years since quitting: 1.2  . Smokeless tobacco: Never Used  . Tobacco comment: Currently using Nicorette Gum  Substance Use Topics  . Alcohol use: Yes    Comment: wine 2 glasses a day     Family History    Problem Relation Age of Onset  . Cancer Mother        lung cancer  . Diabetes Other   . Hyperlipidemia Other   . Hypertension Other   . Cancer Other     Review of Systems  Constitutional: Negative for chills and fever.  Respiratory: Negative for cough and shortness of breath.   Cardiovascular: Negative for chest pain, palpitations and leg swelling.  Gastrointestinal: Negative for abdominal pain, nausea and vomiting.  per hpi  Objective  Vitals as reported by the patient:  BP 125/95, has not taken BP meds yet  BP Readings from Last 3 Encounters:  08/27/19 140/90  08/09/19 (!) 145/100  06/05/19 104/80   Gen: AAOx3, NAD Speaking in full sentences, normal respiratory effort  ASSESSMENT and PLAN  1. Neuropathy Continues to improve.  Cont current regime. pmp reviwed - traMADol (ULTRAM) 50 MG tablet; Take 1-2 tablets (50-100 mg total) by mouth at bedtime as needed.  2. Essential hypertension Stable. No changes to meds at this time other than taking amlodipine at night, cont with home BP monitoring  3. Pure hypercholesterolemia Checking labs today, medications will be adjusted as needed.  - Comprehensive metabolic panel; Future - Lipid panel; Future  4. Psoriasis Controlled. Continue current regime.   5. Stage 3b chronic kidney disease Stable. Cont to avoid nephrotoxins  6. Small cell carcinoma of lower lobe of right lung ([place]) Managed by onc. Currently monitoring with q3 months CT scan.  7. Nausea vomiting and diarrhea - ondansetron (ZOFRAN) 4 MG tablet; Take 1 tablet (4 mg total) by mouth every 8 (eight) hours as needed for nausea or vomiting.  Other orders - Clobetasol Prop Emollient Base (CLOBETASOL PROPIONATE E) 0.05 % emollient cream; Apply 1 application topically 2 (two) times daily.  FOLLOW-UP: fasting labs next week, next OV 3 months for routine followup   The above assessment and management plan was discussed with the patient. The patient verbalized  understanding of and has agreed to the management plan. Patient is aware to call the clinic if symptoms persist or worsen. Patient is aware when to return to the clinic for a follow-up visit. Patient educated on when it is appropriate to go to the emergency department.    I provided 20 minutes of non-face-to-face time during this encounter.  Rutherford Guys, MD [facility] at [place], [place] [address] Ph.  [phone] Fax [phone]

## 2019-09-12 ENCOUNTER — Ambulatory Visit: Payer: Medicare Other

## 2019-09-17 ENCOUNTER — Ambulatory Visit: Payer: Medicare PPO

## 2019-09-18 ENCOUNTER — Ambulatory Visit: Payer: Medicare PPO | Attending: Internal Medicine

## 2019-09-18 DIAGNOSIS — Z23 Encounter for immunization: Secondary | ICD-10-CM | POA: Insufficient documentation

## 2019-09-18 NOTE — Progress Notes (Signed)
   Covid-19 Vaccination Clinic  Name:  PORCHEA CHARRIER    MRN: [id number] DOB: June 26, 1951  09/18/2019  Ms. Hunn was observed post Covid-19 immunization for 15 minutes without incident. She was provided with Vaccine Information Sheet and instruction to access the V-Safe system.   Ms. [place]brook was instructed to call 911 with any severe reactions post vaccine: Marland Kitchen Difficulty breathing  . Swelling of face and throat  . A fast heartbeat  . A bad rash all over body  . Dizziness and weakness   Immunizations Administered    Name Date Dose VIS Date Route   Pfizer COVID-19 Vaccine 09/18/2019  2:17 PM 0.3 mL 06/27/2019 Intramuscular   Manufacturer: [place]   Lot: [id number]   [place]: [id number]

## 2019-09-22 ENCOUNTER — Telehealth: Payer: Self-pay | Admitting: Medical Oncology

## 2019-09-22 ENCOUNTER — Telehealth: Payer: Self-pay | Admitting: Internal Medicine

## 2019-09-22 NOTE — Telephone Encounter (Signed)
I gave number to CS to pt to f/u for CT appt.

## 2019-09-22 NOTE — Telephone Encounter (Signed)
R/s labs per 3/8 sch message - pt is aware of appt date and time

## 2019-09-24 ENCOUNTER — Telehealth: Payer: Self-pay | Admitting: *Deleted

## 2019-09-24 ENCOUNTER — Telehealth: Payer: Self-pay | Admitting: Family Medicine

## 2019-09-24 NOTE — Telephone Encounter (Signed)
Pt returned call to schedule the awv

## 2019-09-24 NOTE — Telephone Encounter (Signed)
Schedule AWV.  

## 2019-10-02 ENCOUNTER — Emergency Department (HOSPITAL_COMMUNITY): Payer: Medicare PPO

## 2019-10-02 ENCOUNTER — Other Ambulatory Visit: Payer: Self-pay

## 2019-10-02 ENCOUNTER — Encounter (HOSPITAL_COMMUNITY): Payer: Self-pay | Admitting: Emergency Medicine

## 2019-10-02 ENCOUNTER — Inpatient Hospital Stay (HOSPITAL_COMMUNITY)
Admission: EM | Admit: 2019-10-02 | Discharge: 2019-10-05 | DRG: 054 | Disposition: A | Discharge status: Home / Self Care | Payer: Medicare PPO | Attending: Family Medicine | Admitting: Family Medicine

## 2019-10-02 ENCOUNTER — Other Ambulatory Visit: Payer: Self-pay | Admitting: Radiation Therapy

## 2019-10-02 DIAGNOSIS — N179 Acute kidney failure, unspecified: Secondary | ICD-10-CM | POA: Diagnosis present

## 2019-10-02 DIAGNOSIS — C7931 Secondary malignant neoplasm of brain: Principal | ICD-10-CM | POA: Diagnosis present

## 2019-10-02 DIAGNOSIS — Z87891 Personal history of nicotine dependence: Secondary | ICD-10-CM

## 2019-10-02 DIAGNOSIS — Z7901 Long term (current) use of anticoagulants: Secondary | ICD-10-CM

## 2019-10-02 DIAGNOSIS — G620 Drug-induced polyneuropathy: Secondary | ICD-10-CM | POA: Diagnosis present

## 2019-10-02 DIAGNOSIS — Z20822 Contact with and (suspected) exposure to covid-19: Secondary | ICD-10-CM | POA: Diagnosis present

## 2019-10-02 DIAGNOSIS — N1831 Chronic kidney disease, stage 3a: Secondary | ICD-10-CM | POA: Diagnosis present

## 2019-10-02 DIAGNOSIS — I161 Hypertensive emergency: Secondary | ICD-10-CM

## 2019-10-02 DIAGNOSIS — T451X5A Adverse effect of antineoplastic and immunosuppressive drugs, initial encounter: Secondary | ICD-10-CM | POA: Diagnosis present

## 2019-10-02 DIAGNOSIS — G9340 Encephalopathy, unspecified: Secondary | ICD-10-CM | POA: Diagnosis not present

## 2019-10-02 DIAGNOSIS — M25512 Pain in left shoulder: Secondary | ICD-10-CM | POA: Diagnosis present

## 2019-10-02 DIAGNOSIS — Z9181 History of falling: Secondary | ICD-10-CM | POA: Diagnosis not present

## 2019-10-02 DIAGNOSIS — N183 Chronic kidney disease, stage 3 unspecified: Secondary | ICD-10-CM | POA: Diagnosis present

## 2019-10-02 DIAGNOSIS — E785 Hyperlipidemia, unspecified: Secondary | ICD-10-CM | POA: Diagnosis present

## 2019-10-02 DIAGNOSIS — I674 Hypertensive encephalopathy: Secondary | ICD-10-CM | POA: Diagnosis present

## 2019-10-02 DIAGNOSIS — C3431 Malignant neoplasm of lower lobe, right bronchus or lung: Secondary | ICD-10-CM | POA: Diagnosis present

## 2019-10-02 DIAGNOSIS — I129 Hypertensive chronic kidney disease with stage 1 through stage 4 chronic kidney disease, or unspecified chronic kidney disease: Secondary | ICD-10-CM | POA: Diagnosis present

## 2019-10-02 DIAGNOSIS — Z8673 Personal history of transient ischemic attack (TIA), and cerebral infarction without residual deficits: Secondary | ICD-10-CM

## 2019-10-02 DIAGNOSIS — G936 Cerebral edema: Secondary | ICD-10-CM | POA: Diagnosis present

## 2019-10-02 DIAGNOSIS — R519 Headache, unspecified: Secondary | ICD-10-CM | POA: Diagnosis not present

## 2019-10-02 DIAGNOSIS — Z86718 Personal history of other venous thrombosis and embolism: Secondary | ICD-10-CM | POA: Diagnosis not present

## 2019-10-02 DIAGNOSIS — Z95828 Presence of other vascular implants and grafts: Secondary | ICD-10-CM

## 2019-10-02 DIAGNOSIS — Z801 Family history of malignant neoplasm of trachea, bronchus and lung: Secondary | ICD-10-CM | POA: Diagnosis not present

## 2019-10-02 DIAGNOSIS — I160 Hypertensive urgency: Secondary | ICD-10-CM | POA: Diagnosis not present

## 2019-10-02 DIAGNOSIS — N1832 Chronic kidney disease, stage 3b: Secondary | ICD-10-CM | POA: Diagnosis not present

## 2019-10-02 DIAGNOSIS — D649 Anemia, unspecified: Secondary | ICD-10-CM | POA: Diagnosis present

## 2019-10-02 DIAGNOSIS — R0602 Shortness of breath: Secondary | ICD-10-CM | POA: Diagnosis not present

## 2019-10-02 LAB — LIPASE, BLOOD: Lipase: 18 U/L (ref 11–51)

## 2019-10-02 LAB — CBC WITH DIFFERENTIAL/PLATELET
Abs Immature Granulocytes: 0.01 10*3/uL (ref 0.00–0.07)
Basophils Absolute: 0 10*3/uL (ref 0.0–0.1)
Basophils Relative: 0 %
Eosinophils Absolute: 0.1 10*3/uL (ref 0.0–0.5)
Eosinophils Relative: 2 %
HCT: 35.3 % — ABNORMAL LOW (ref 36.0–46.0)
Hemoglobin: 11 g/dL — ABNORMAL LOW (ref 12.0–15.0)
Immature Granulocytes: 0 %
Lymphocytes Relative: 7 %
Lymphs Abs: 0.5 10*3/uL — ABNORMAL LOW (ref 0.7–4.0)
MCH: 32.7 pg (ref 26.0–34.0)
MCHC: 31.2 g/dL (ref 30.0–36.0)
MCV: 105.1 fL — ABNORMAL HIGH (ref 80.0–100.0)
Monocytes Absolute: 0.4 10*3/uL (ref 0.1–1.0)
Monocytes Relative: 6 %
Neutro Abs: 5.9 10*3/uL (ref 1.7–7.7)
Neutrophils Relative %: 85 %
Platelets: 168 10*3/uL (ref 150–400)
RBC: 3.36 MIL/uL — ABNORMAL LOW (ref 3.87–5.11)
RDW: 12.9 % (ref 11.5–15.5)
WBC: 7 10*3/uL (ref 4.0–10.5)
nRBC: 0 % (ref 0.0–0.2)

## 2019-10-02 LAB — COMPREHENSIVE METABOLIC PANEL
ALT: 18 U/L (ref 0–44)
AST: 16 U/L (ref 15–41)
Albumin: 3.9 g/dL (ref 3.5–5.0)
Alkaline Phosphatase: 60 U/L (ref 38–126)
Anion gap: 9 (ref 5–15)
BUN: 20 mg/dL (ref 8–23)
CO2: 25 mmol/L (ref 22–32)
Calcium: 9.2 mg/dL (ref 8.9–10.3)
Chloride: 107 mmol/L (ref 98–111)
Creatinine, Ser: 1.55 mg/dL — ABNORMAL HIGH (ref 0.44–1.00)
GFR calc Af Amer: 40 mL/min — ABNORMAL LOW (ref >60)
GFR calc non Af Amer: 35 mL/min — ABNORMAL LOW (ref >60)
Glucose, Bld: 117 mg/dL — ABNORMAL HIGH (ref 70–99)
Potassium: 3.6 mmol/L (ref 3.5–5.1)
Sodium: 141 mmol/L (ref 135–145)
Total Bilirubin: 0.7 mg/dL (ref 0.3–1.2)
Total Protein: 6.6 g/dL (ref 6.5–8.1)

## 2019-10-02 MED ORDER — LISINOPRIL 20 MG PO TABS
40.0000 mg | ORAL_TABLET | Freq: Every day | ORAL | Status: DC
  Administered 2019-10-03: 40 mg via ORAL
  Filled 2019-10-02: qty 2

## 2019-10-02 MED ORDER — PREGABALIN 50 MG PO CAPS
50.0000 mg | ORAL_CAPSULE | Freq: Three times a day (TID) | ORAL | Status: DC
  Administered 2019-10-02 – 2019-10-05 (×9): 50 mg via ORAL
  Filled 2019-10-02 (×9): qty 1

## 2019-10-02 MED ORDER — CHLORHEXIDINE GLUCONATE CLOTH 2 % EX PADS
6.0000 | MEDICATED_PAD | Freq: Every day | CUTANEOUS | Status: DC
  Administered 2019-10-02 – 2019-10-04 (×3): 6 via TOPICAL

## 2019-10-02 MED ORDER — ONDANSETRON HCL 4 MG PO TABS: 4.0000 mg | ORAL_TABLET | Freq: Three times a day (TID) | ORAL | Status: DC | PRN

## 2019-10-02 MED ORDER — LABETALOL HCL 100 MG PO TABS
100.0000 mg | ORAL_TABLET | Freq: Two times a day (BID) | ORAL | Status: DC
  Administered 2019-10-02 – 2019-10-03 (×2): 100 mg via ORAL
  Filled 2019-10-02 (×5): qty 1

## 2019-10-02 MED ORDER — LISINOPRIL 20 MG PO TABS
40.0000 mg | ORAL_TABLET | Freq: Once | ORAL | Status: AC
  Administered 2019-10-02: 40 mg via ORAL
  Filled 2019-10-02: qty 2

## 2019-10-02 MED ORDER — GADOBUTROL 1 MMOL/ML IV SOLN
7.0000 mL | Freq: Once | INTRAVENOUS | Status: AC | PRN
  Administered 2019-10-02: 7 mL via INTRAVENOUS

## 2019-10-02 MED ORDER — AMLODIPINE BESYLATE 10 MG PO TABS
10.0000 mg | ORAL_TABLET | Freq: Every day | ORAL | Status: DC
  Administered 2019-10-02 – 2019-10-03 (×2): 10 mg via ORAL
  Administered 2019-10-04: 5 mg via ORAL
  Filled 2019-10-02 (×2): qty 1
  Filled 2019-10-02: qty 2

## 2019-10-02 MED ORDER — SODIUM CHLORIDE 0.9% FLUSH
10.0000 mL | Freq: Two times a day (BID) | INTRAVENOUS | Status: DC
  Administered 2019-10-02 – 2019-10-04 (×2): 10 mL

## 2019-10-02 MED ORDER — LABETALOL HCL 5 MG/ML IV SOLN
10.0000 mg | Freq: Once | INTRAVENOUS | Status: AC
  Administered 2019-10-02: 10 mg via INTRAVENOUS
  Filled 2019-10-02: qty 4

## 2019-10-02 MED ORDER — LABETALOL HCL 5 MG/ML IV SOLN
10.0000 mg | Freq: Once | INTRAVENOUS | Status: AC
  Administered 2019-10-02: 10 mg via INTRAVENOUS

## 2019-10-02 MED ORDER — TRAMADOL HCL 50 MG PO TABS
50.0000 mg | ORAL_TABLET | Freq: Four times a day (QID) | ORAL | Status: DC | PRN
  Filled 2019-10-02: qty 1

## 2019-10-02 MED ORDER — ACETAMINOPHEN 325 MG PO TABS: 650.0000 mg | ORAL_TABLET | Freq: Three times a day (TID) | ORAL | Status: DC | PRN

## 2019-10-02 MED ORDER — HYDRALAZINE HCL 20 MG/ML IJ SOLN
10.0000 mg | Freq: Four times a day (QID) | INTRAMUSCULAR | Status: DC | PRN
  Administered 2019-10-02: 10 mg via INTRAVENOUS
  Filled 2019-10-02: qty 1

## 2019-10-02 MED ORDER — SODIUM CHLORIDE 0.9% FLUSH
10.0000 mL | INTRAVENOUS | Status: DC | PRN
  Administered 2019-10-04: 10 mL

## 2019-10-02 MED ORDER — SODIUM CHLORIDE 0.9 % IV BOLUS
500.0000 mL | Freq: Once | INTRAVENOUS | Status: AC
  Administered 2019-10-02: 500 mL via INTRAVENOUS

## 2019-10-02 MED ORDER — ONDANSETRON HCL 4 MG/2ML IJ SOLN
4.0000 mg | Freq: Four times a day (QID) | INTRAMUSCULAR | Status: DC | PRN
  Administered 2019-10-02: 4 mg via INTRAVENOUS
  Filled 2019-10-02: qty 2

## 2019-10-02 MED ORDER — LABETALOL HCL 5 MG/ML IV SOLN
10.0000 mg | INTRAVENOUS | Status: DC | PRN
  Administered 2019-10-02 (×2): 10 mg via INTRAVENOUS
  Filled 2019-10-02 (×2): qty 4

## 2019-10-02 MED ORDER — ACETAMINOPHEN 325 MG PO TABS
650.0000 mg | ORAL_TABLET | Freq: Four times a day (QID) | ORAL | Status: DC | PRN
  Administered 2019-10-02 – 2019-10-04 (×3): 650 mg via ORAL
  Filled 2019-10-02 (×3): qty 2

## 2019-10-02 MED ORDER — DULOXETINE HCL 30 MG PO CPEP
30.0000 mg | ORAL_CAPSULE | Freq: Every day | ORAL | Status: DC
  Administered 2019-10-02 – 2019-10-05 (×4): 30 mg via ORAL
  Filled 2019-10-02 (×4): qty 1

## 2019-10-02 MED ORDER — LISINOPRIL 20 MG PO TABS: 40.0000 mg | ORAL_TABLET | Freq: Every day | ORAL | Status: DC

## 2019-10-02 MED ORDER — ACETAMINOPHEN 650 MG RE SUPP: 650.0000 mg | Freq: Four times a day (QID) | RECTAL | Status: DC | PRN

## 2019-10-02 MED ORDER — DEXAMETHASONE SODIUM PHOSPHATE 4 MG/ML IJ SOLN
4.0000 mg | Freq: Four times a day (QID) | INTRAMUSCULAR | Status: DC
  Administered 2019-10-02 – 2019-10-05 (×11): 4 mg via INTRAVENOUS
  Filled 2019-10-02 (×13): qty 1

## 2019-10-02 MED ORDER — HYDRALAZINE HCL 50 MG PO TABS
50.0000 mg | ORAL_TABLET | Freq: Three times a day (TID) | ORAL | Status: DC
  Administered 2019-10-02 – 2019-10-03 (×3): 50 mg via ORAL
  Filled 2019-10-02 (×3): qty 1

## 2019-10-02 MED ORDER — ONDANSETRON HCL 4 MG PO TABS: 4.0000 mg | ORAL_TABLET | Freq: Four times a day (QID) | ORAL | Status: DC | PRN

## 2019-10-02 MED ORDER — RIVAROXABAN 20 MG PO TABS
20.0000 mg | ORAL_TABLET | Freq: Every day | ORAL | Status: DC
  Administered 2019-10-02 – 2019-10-04 (×3): 20 mg via ORAL
  Filled 2019-10-02 (×4): qty 1

## 2019-10-02 NOTE — ED Notes (Signed)
Pt transported to MRI at this time 

## 2019-10-02 NOTE — ED Notes (Signed)
Pt transported to CT ?

## 2019-10-02 NOTE — ED Provider Notes (Signed)
[facility] DEPT Provider Note   CSN: [id number] Arrival date & time: 10/02/19  1022     History Chief Complaint  Patient presents with  . Fatigue  . Fall    [name] is a 69 y.o. female with history of lung cancer currently in remission, CVA with no residual deficits, CKD, jugular venous thrombosis currently on Xarelto, hyperlipidemia, hypertension, peripheral neuropathy, peptic ulcer disease presenting for evaluation of acute onset, persistent generalized weakness for 1 week.  Patient significant other at the bedside reports that he has noted that she has been sleeping more, and not eating or drinking as much, and has required assistance with getting up and ambulating.  She typically does not require the use of any assistive devices with ambulation.  Presently she does feel quite unsteady with ambulation, has had increased falls.  Almost 2 weeks ago she fell and landed on her left shoulder, has persistent pain to the left shoulder but has normal range of motion.  They have noted that for the last 3 days her blood pressures have been elevated despite being given her medications.  She has not had any of her morning medicines today.  Patient also notes nausea and persistently "spitting up", but does note she has been able to keep down what little p.o. food and fluids she has been eating.  Denies difficulty breathing or swallowing.  She denies urinary symptoms, abdominal pain, chest pain, shortness of breath, or fevers.  She reports daily frontal throbbing headache similar to migraines she has had previously that began after she started taking Xarelto for a jugular venous thrombosis that was diagnosed in the ED on 08/09/2019.  Reports she has a history of vision problems but thinks that her vision has been worsening since the headaches began.  She has been taking Zofran for her nausea with some improvement.  Her significant other has also noted that she will  sometimes not be able to remember if she has taken her medicines and he thinks she may be taking extra doses of her Lyrica accidentally so he has started administering her medicines for her.  The history is provided by the patient and a significant other.       Past Medical History:  Diagnosis Date  . Arthritis   . Chronic pain   . Hyperlipidemia   . Hypertension   . Low blood magnesium 10/04/2018  . lung ca dx'd 06/2018  . Stroke [facility])    06/19/2018 minor    Patient Active Problem List   Diagnosis Date Noted  . Hypertensive urgency 10/02/2019  . Port-A-Cath in place 04/08/2019  . CKD (chronic kidney disease) stage 3, GFR 30-59 ml/min 03/06/2019  . Neuropathy 03/06/2019  . Megaloblastic anemia 03/06/2019  . GIB (gastrointestinal bleeding) 10/30/2018  . Symptomatic anemia 10/30/2018  . Hypotension 10/30/2018  . Hypokalemia 10/30/2018  . Acute renal failure superimposed on stage 3 chronic kidney disease ([place]) 10/30/2018  . Pancytopenia ([place]) 10/30/2018  . Nausea vomiting and diarrhea 10/30/2018  . PUD (peptic ulcer disease) 10/30/2018  . Sepsis ([place]) 10/30/2018  . Hypomagnesemia 09/24/2018  . Small cell carcinoma of lower lobe of right lung ([place]) 08/28/2018  . Encounter for antineoplastic chemotherapy 08/28/2018  . Goals of care, counseling/discussion 08/28/2018  . Right lower lobe lung mass   . Acute CVA (cerebrovascular accident) ([place]) 06/21/2018  . Acute blood loss anemia 06/21/2018  . Upper GI bleed 06/19/2018  . Psoriasis 12/14/2017  . Knee pain, right 12/14/2017  .  BMI 32.0-32.9,adult 12/15/2011  . Underinsured 12/15/2011  . HLD (hyperlipidemia) 03/26/2007  . Essential hypertension 03/26/2007    Past Surgical History:  Procedure Laterality Date  . BIOPSY  06/19/2018   Procedure: BIOPSY;  Surgeon: Laurence Spates, MD;  Location: [facility]COPY;  Service: Endoscopy;;  . BIOPSY  10/11/2018   Procedure: BIOPSY;  Surgeon: Laurence Spates, MD;  Location: [facility]OSCOPY;   Service: Endoscopy;;  . ENDARTERECTOMY Right 06/26/2018   Procedure: ENDARTERECTOMY CAROTID RIGHT;  Surgeon: Serafina Mitchell, MD;  Location: [facility] OR;  Service: Vascular;  Laterality: Right;  . ESOPHAGOGASTRODUODENOSCOPY (EGD) WITH PROPOFOL N/A 06/19/2018   Procedure: ESOPHAGOGASTRODUODENOSCOPY (EGD) WITH PROPOFOL;  Surgeon: Laurence Spates, MD;  Location: [facility]COPY;  Service: Endoscopy;  Laterality: N/A;  . FLEXIBLE SIGMOIDOSCOPY N/A 10/11/2018   Procedure: FLEXIBLE SIGMOIDOSCOPY;  Surgeon: Laurence Spates, MD;  Location: [facility];  Service: Endoscopy;  Laterality: N/A;  . IR IMAGING GUIDED PORT INSERTION  10/07/2018  . PATCH ANGIOPLASTY Right 06/26/2018   Procedure: PATCH ANGIOPLASTY USING A XENOSURE 1cm x 6cm BIOLOGIC PATCH;  Surgeon: Serafina Mitchell, MD;  Location: [place];  Service: Vascular;  Laterality: Right;  Marland Kitchen VIDEO BRONCHOSCOPY WITH ENDOBRONCHIAL ULTRASOUND N/A 08/14/2018   Procedure: VIDEO BRONCHOSCOPY WITH ENDOBRONCHIAL ULTRASOUND;  Surgeon: Collene Gobble, MD;  Location: [facility] OR;  Service: Thoracic;  Laterality: N/A;     OB History   No obstetric history on file.     Family History  Problem Relation Age of Onset  . Cancer Mother        lung cancer  . Diabetes Other   . Hyperlipidemia Other   . Hypertension Other   . Cancer Other     Social History   Tobacco Use  . Smoking status: Former Smoker    Packs/day: 1.00    Years: 10.00    Pack years: 10.00    Types: Cigarettes    Quit date: 06/19/2018    Years since quitting: 1.2  . Smokeless tobacco: Never Used  . Tobacco comment: Currently using Nicorette Gum  Substance Use Topics  . Alcohol use: Yes    Comment: wine 2 glasses a day   . Drug use: Never    Comment: Hemp Oil    Home Medications Prior to Admission medications   Medication Sig Start Date End Date Taking? Authorizing Provider  acetaminophen (TYLENOL) 650 MG CR tablet Take 650 mg by mouth every 8 (eight) hours as needed for pain.   Yes [provider]  amLODipine (NORVASC) 10 MG tablet TAKE 1 TABLET BY MOUTH EVERY DAY Patient taking differently: Take 10 mg by mouth daily.  05/02/19  Yes Rutherford Guys, MD  atorvastatin (LIPITOR) 80 MG tablet TAKE 1 TABLET (80 MG TOTAL) BY MOUTH DAILY AT 6 PM. 07/24/19  Yes Rutherford Guys, MD  Clobetasol Prop Emollient Base (CLOBETASOL PROPIONATE E) 0.05 % emollient cream Apply 1 application topically 2 (two) times daily. 09/04/19  Yes Rutherford Guys, MD  DULoxetine (CYMBALTA) 30 MG capsule TAKE 1 CAPSULE BY MOUTH EVERY DAY Patient taking differently: Take 30 mg by mouth daily.  04/25/19  Yes Rutherford Guys, MD  lisinopril (ZESTRIL) 40 MG tablet TAKE 1 TABLET BY MOUTH EVERY DAY Patient taking differently: Take 40 mg by mouth daily.  08/05/19  Yes Rutherford Guys, MD  ondansetron (ZOFRAN) 4 MG tablet Take 1 tablet (4 mg total) by mouth every 8 (eight) hours as needed for nausea or vomiting. 09/04/19  Yes Grant Fontana M,  MD  pregabalin (LYRICA) 50 MG capsule TAKE 1 CAPSULE BY MOUTH 3 TIMES A DAY Patient taking differently: Take 50 mg by mouth 3 (three) times daily.  07/15/19  Yes Rutherford Guys, MD  rivaroxaban (XARELTO) 20 MG TABS tablet Take 1 tablet (20 mg total) by mouth daily with supper. 08/27/19  Yes Curt Bears, MD  traMADol (ULTRAM) 50 MG tablet Take 1-2 tablets (50-100 mg total) by mouth at bedtime as needed. 09/04/19  Yes Rutherford Guys, MD    Allergies    Patient has no known allergies.  Review of Systems   Review of Systems  Constitutional: Positive for fatigue. Negative for chills and fever.  Eyes: Positive for visual disturbance.  Respiratory: Negative for shortness of breath.   Cardiovascular: Negative for chest pain.  Gastrointestinal: Positive for nausea and vomiting.  Neurological: Positive for weakness (generalized) and headaches. Negative for numbness.  All other systems reviewed and are negative.   Physical Exam Updated Vital Signs BP (!) 176/116    Pulse 77   Temp 97.8 F (36.6 C) (Oral)   Resp 14   Ht 5\' 6"  (1.676 m)   Wt 79.4 kg   SpO2 94%   BMI 28.25 kg/m   Physical Exam Vitals and nursing note reviewed.  Constitutional:      General: She is not in acute distress.    Appearance: She is well-developed. She is obese.  HENT:     Head: Normocephalic and atraumatic.  Eyes:     General:        Right eye: No discharge.        Left eye: No discharge.     Extraocular Movements: Extraocular movements intact.     Conjunctiva/sclera: Conjunctivae normal.     Pupils: Pupils are equal, round, and reactive to light.  Neck:     Vascular: No JVD.     Trachea: No tracheal deviation.  Cardiovascular:     Rate and Rhythm: Normal rate and regular rhythm.     Pulses: Normal pulses.  Pulmonary:     Effort: Pulmonary effort is normal.     Comments: Speaking in full sentences without difficulty, somewhat diminished breath sounds globally. Abdominal:     General: Bowel sounds are normal. There is no distension.     Palpations: Abdomen is soft.     Tenderness: There is no abdominal tenderness. There is no guarding or rebound.  Musculoskeletal:        General: Tenderness present.     Cervical back: Neck supple.     Comments: No midline spine tenderness.  Tenderness to palpation of the left acromioclavicular joint and bicipital tendon groove of the left shoulder but normal active and passive range of motion with no deformity or crepitus.  Skin:    General: Skin is warm and dry.     Findings: No erythema.  Neurological:     General: No focal deficit present.     Mental Status: She is alert and oriented to person, place, and time.     Comments: Mental Status:  Alert, thought content appropriate, able to give a coherent history. Speech fluent without evidence of aphasia. Able to follow 2 step commands without difficulty.  Cranial Nerves:  II:  Peripheral visual fields grossly normal, pupils equal, round, reactive to light III,IV, VI:  ptosis not present, extra-ocular motions intact bilaterally  V,VII: smile symmetric, facial light touch sensation equal VIII: hearing grossly normal to voice  X: uvula elevates symmetrically  XI: bilateral  shoulder shrug symmetric and strong XII: midline tongue extension without fassiculations Motor:  Normal tone. 5/5 strength of BUE and BLE major muscle groups including strong and equal grip strength and dorsiflexion/plantar flexion Sensory: light touch normal in all extremities.    Psychiatric:        Behavior: Behavior normal.     ED Results / Procedures / Treatments   Labs (all labs ordered are listed, but only abnormal results are displayed) Labs Reviewed  CBC WITH DIFFERENTIAL/PLATELET - Abnormal; Notable for the following components:      Result Value   RBC 3.36 (*)    Hemoglobin 11.0 (*)    HCT 35.3 (*)    MCV 105.1 (*)    Lymphs Abs 0.5 (*)    All other components within normal limits  COMPREHENSIVE METABOLIC PANEL - Abnormal; Notable for the following components:   Glucose, Bld 117 (*)    Creatinine, Ser 1.55 (*)    GFR calc non Af Amer 35 (*)    GFR calc Af Amer 40 (*)    All other components within normal limits  URINE CULTURE  LIPASE, BLOOD  URINALYSIS, ROUTINE W REFLEX MICROSCOPIC  HIV ANTIBODY (ROUTINE TESTING W REFLEX)    EKG None  Radiology CT Head Wo Contrast  Result Date: 10/02/2019 CLINICAL DATA:  Headaches, on Xarelto, confusion; altered mental status unclear cause. Additional history provided: History of lung cancer diagnosed 2019. EXAM: CT HEAD WITHOUT CONTRAST TECHNIQUE: Contiguous axial images were obtained from the base of the skull through the vertex without intravenous contrast. COMPARISON:  Brain MRI 01/20/2019 FINDINGS: Brain: There is an isodense mass measuring 3.9 x 3.1 x 3.2 centered along the inferior margin of the frontal horn of the right lateral ventricle. There is mild surrounding edema within the right frontal lobe. This mass near  completely effaces the right frontal horn and extends slightly across midline. 2.1 cm focus of edema within the anterior left frontal lobe at the gray-white junction (for instance as seen on series 2, image 18). There is also extensive edema throughout much of the right cerebellum extending to the region of the vermis and right middle cerebellar peduncle with associated mass effect. Associated posterior fossa mass effect with partial effacement of the fourth ventricle. There is no evidence of acute intracranial hemorrhage. No extra-axial fluid collection. Additional ill-defined hypoattenuation within the cerebral white matter is nonspecific, but consistent with chronic small vessel ischemic disease. A tiny chronic lacunar infarct within the left cerebellum was better appreciated on prior MRI. Mild generalized parenchymal atrophy. Vascular: No hyperdense vessel.  Atherosclerotic calcifications. Skull: Normal. Negative for fracture or focal lesion. Sinuses/Orbits: Visualized orbits demonstrate no acute abnormality. Mild paranasal sinus mucosal thickening. No significant mastoid effusion. These results were called by telephone at the time of interpretation on 10/02/2019 at 12:16 pm to provider [facility] , who verbally acknowledged these results. IMPRESSION: 3.9 cm mass near completely effacing the frontal horn of the right lateral ventricle and extending slightly across midline. Mild surrounding edema within the right frontal lobe. Additional 2.1 cm focus of edema within the anterior left frontal lobe at the gray-white junction. There is also extensive edema throughout much of the right cerebellum and extending to the vermis and right middle cerebellar peduncle. Findings likely reflect multifocal intracranial metastatic disease. Contrast-enhanced brain MRI is recommended for further evaluation and to assess for additional intracranial metastases. Posterior fossa mass effect with significant effacement of the fourth  ventricle. No evidence of hydrocephalus on the current exam.  Background chronic small vessel ischemic disease. Electronically Signed   By: Kellie Simmering DO   On: 10/02/2019 12:17   MR Brain W and Wo Contrast  Result Date: 10/02/2019 CLINICAL DATA:  Headaches. Altered mental status. History of lung cancer. Abnormal head CT EXAM: MRI HEAD WITHOUT AND WITH CONTRAST TECHNIQUE: Multiplanar, multiecho pulse sequences of the brain and surrounding structures were obtained without and with intravenous contrast. CONTRAST:  63mL GADAVIST GADOBUTROL 1 MMOL/ML IV SOLN COMPARISON:  Head CT 10/02/2019.  MRI 01/20/2019 FINDINGS: Brain: Necrotic metastasis in the right cerebellum measuring 4.6 x 4.1 x 3.1 Cm. Vasogenic edema with mass effect. Narrowing of the fourth ventricle but no evidence complete obstruction. Solid metastasis with remarkable lack of necrosis in the right frontal region, possibly with the epicenter in the caudate head, measuring 3.6 x 3.5 x 3.4 cm. Surrounding vasogenic edema. Mass effect with flattening of the frontal horn of the right lateral ventricle. Right-to-left midline shift at this level of 4 mm. Generalized right-to-left shift is not present. Necrotic metastasis in the left frontal lobe measuring 2.4 x 1.9 x 2.3 cm. No associated vasogenic edema. Probable nodular ependymal metastasis along the medial wall of the left lateral ventricle, axial image 31. Question second ependymal metastasis at the floor of the left lateral ventricle, axial image 27. This may simply be a vessel. Elsewhere, there are moderate chronic small-vessel ischemic changes of the cerebral hemispheric white matter. No extra-axial fluid collection. Vascular: Major vessels at the base of the brain show flow. Skull and upper cervical spine: Negative Sinuses/Orbits: Clear/normal Other: None IMPRESSION: 4.6 x 4.1 x 3.1 cm necrotic metastasis within the right cerebellum with vasogenic edema and mass effect. 3.6 x 3.5 x 3.4 cm solid  metastasis in the right frontal region, possibly with the epicenter in the caudate head. Vasogenic edema and mass effect. 2.4 x 1.9 x 2.3 cm partially necrotic metastasis in the left frontal lobe without vasogenic edema. Two or 3 mm ependymal metastasis along the medial wall of the left lateral ventricle, axial image 31. Question punctate metastasis along the floor of the left lateral ventricle axial image 27. This may simply be a vessel. Electronically Signed   By: Nelson Chimes M.D.   On: 10/02/2019 14:14   DG Chest Portable 1 View  Result Date: 10/02/2019 CLINICAL DATA:  Weakness and shortness of breath. History of lung cancer. EXAM: PORTABLE CHEST 1 VIEW COMPARISON:  Chest CT 08/09/2019. Chest x-ray 10/29/2018 FINDINGS: 1130 hours. Left lung clear. Volume loss noted right hemithorax, as before. Right paramediastinal and hilar opacity on today's chest x-ray compatible with treatment related changes noted on recent CT scan. Consolidative opacity in the right lower lobe and small right pleural effusion are similar. Right Port-A-Cath again noted. Telemetry leads overlie the chest. IMPRESSION: Volume loss right hemithorax with right paramediastinal and parahilar opacity consistent neoplastic and post treatment changes noted on recent chest CT. No pulmonary edema or pneumothorax. Electronically Signed   By: Misty Stanley M.D.   On: 10/02/2019 12:14    Procedures .Critical Care Performed by: Renita Papa, PA-C Authorized by: Renita Papa, PA-C   Critical care provider statement:    Critical care time (minutes):  45   Critical care was necessary to treat or prevent imminent or life-threatening deterioration of the following conditions:  Circulatory failure   Critical care was time spent personally by me on the following activities:  Discussions with consultants, evaluation of patient's response to treatment, examination of patient, ordering and  performing treatments and interventions, ordering and review  of laboratory studies, ordering and review of radiographic studies, pulse oximetry, re-evaluation of patient's condition, obtaining history from patient or surrogate and review of old charts   (including critical care time)  Medications Ordered in ED Medications  dexamethasone (DECADRON) injection 4 mg (4 mg Intravenous Given 10/02/19 1504)  amLODipine (NORVASC) tablet 10 mg (has no administration in time range)  DULoxetine (CYMBALTA) DR capsule 30 mg (has no administration in time range)  traMADol (ULTRAM) tablet 50 mg (has no administration in time range)  labetalol (NORMODYNE) tablet 100 mg (has no administration in time range)  lisinopril (ZESTRIL) tablet 40 mg (has no administration in time range)  labetalol (NORMODYNE) injection 10 mg (10 mg Intravenous Given 10/02/19 1542)  ondansetron (ZOFRAN) tablet 4 mg (has no administration in time range)    Or  ondansetron (ZOFRAN) injection 4 mg (has no administration in time range)  acetaminophen (TYLENOL) tablet 650 mg (has no administration in time range)    Or  acetaminophen (TYLENOL) suppository 650 mg (has no administration in time range)  labetalol (NORMODYNE) injection 10 mg (10 mg Intravenous Given 10/02/19 1131)  sodium chloride 0.9 % bolus 500 mL (0 mLs Intravenous Stopped 10/02/19 1307)  labetalol (NORMODYNE) injection 10 mg (10 mg Intravenous Given 10/02/19 1504)  lisinopril (ZESTRIL) tablet 40 mg (40 mg Oral Given 10/02/19 1503)  gadobutrol (GADAVIST) 1 MMOL/ML injection 7 mL (7 mLs Intravenous Contrast Given 10/02/19 1339)    ED Course  I have reviewed the triage vital signs and the nursing notes.  Pertinent labs & imaging results that were available during my care of the patient were reviewed by me and considered in my medical decision making (see chart for details).    MDM Rules/Calculators/A&P                      Patient presenting for evaluation of headaches with vision changes for approximately 2 months but more recently  developed nausea, generalized weakness, difficulty ambulating and unsteady gait.  She had a fall approximately 2 weeks ago with left shoulder pain but normal range of motion and is neurovascularly intact.  Discussed the utility of images of the shoulder with patient and significant other and they declined stating the doubt that it is fractured or dislocated and I agree with this assessment.  Overall she appears chronically unwell.  In the ED she is afebrile but persistently hypertensive.  Her significant other at the bedside reports that her blood pressures are typically quite well controlled on her medications but for the last 3 days have been uncontrolled despite compliance with medicines.  She did not have her blood pressure medications this morning.  We will give a dose of IV labetalol and reassess.  Lab work reviewed and interpreted by myself shows no leukocytosis, mild anemia, mild elevation in creatinine but BUN is within normal limits.  Head CT unfortunately shows evidence of metastatic disease, no hemorrhage.  The patient have some improvement in her blood pressures with IV labetalol.  I am concerned with her unsteady gait, uncontrolled hypertension and new brain metastases on imaging that she is not safe for discharge home at this time.  12:45PM CONSULT: Spoke with Dr. Mickeal Skinner with neuro-oncology.  He reviewed the patient's images independently.  He recommends hospital admission for better control of the patient's blood pressure, MRI with and without contrast for further characterization of her brain metastases, and starting the patient on 4 mg dexamethasone 4  times daily.  CONSULT: Spoke with patient's oncologist Dr. Julien Nordmann, discussed findings.  He notes that there is nothing to do from a heme-onc standpoint but does recommend informing the patient's radiation oncologist Dr. Sondra Come for further formulation of the patient's treatment plan. I have routed a message to him informing him of the patient's  results.  Spoke with Dr. Wynelle Cleveland with [facility] hospitalist service who agrees to assume care of patient and bring her into the hospital for further evaluation and management.  Patient was seen and evaluated by Dr. Roderic Palau who agrees with assessment and plan at this time.  Final Clinical Impression(s) / ED Diagnoses Final diagnoses:  Hypertensive emergency  Brain metastases [facility])    Rx / [place] Orders ED Discharge Orders    None       Debroah Baller 10/02/19 1557    Milton Ferguson, MD 10/07/19 1023

## 2019-10-02 NOTE — H&P (Addendum)
History and Physical    [name]  XBJ:[id number]  DOB: 01/24/1951  DOA: 10/02/2019 PCP: Rutherford Guys, MD   Patient coming from: Home  Chief Complaint: High blood pressure  HPI: [name] is a 70 y.o. female with medical history of lung cancer, hypertension, hyperlipidemia, jugular venous thrombosis who presents to the hospital as her blood pressure has been high for the past few days.  In the ED she is noted to have a systolic in the 308M.  On normal day, her blood pressure is usually well controlled with a systolic in the 578I and therefore these high blood pressures are very unusual for her. She also admits to frequent headaches for the past few weeks and relates these headaches to the initiation of Xarelto for her DVT. The patient states that she has been taking all of her home medications appropriately however, blood pressure continues to run high.  ED Course: Blood pressure 210/139 followed by 192/126, pulse ox 94% on room air  Review of Systems:  All other systems reviewed and apart from HPI, are negative.  Past Medical History:  Diagnosis Date  . Arthritis   . Chronic pain   . Hyperlipidemia   . Hypertension   . Low blood magnesium 10/04/2018  . lung ca dx'd 06/2018  . Stroke ([place])    06/19/2018 minor    Past Surgical History:  Procedure Laterality Date  . BIOPSY  06/19/2018   Procedure: BIOPSY;  Surgeon: Laurence Spates, MD;  Location: [facility];  Service: Endoscopy;;  . BIOPSY  10/11/2018   Procedure: BIOPSY;  Surgeon: Laurence Spates, MD;  Location: [facility];  Service: Endoscopy;;  . ENDARTERECTOMY Right 06/26/2018   Procedure: ENDARTERECTOMY CAROTID RIGHT;  Surgeon: Serafina Mitchell, MD;  Location: [facility] OR;  Service: Vascular;  Laterality: Right;  . ESOPHAGOGASTRODUODENOSCOPY (EGD) WITH PROPOFOL N/A 06/19/2018   Procedure: ESOPHAGOGASTRODUODENOSCOPY (EGD) WITH PROPOFOL;  Surgeon: Laurence Spates, MD;  Location: [facility];  Service:  Endoscopy;  Laterality: N/A;  . FLEXIBLE SIGMOIDOSCOPY N/A 10/11/2018   Procedure: FLEXIBLE SIGMOIDOSCOPY;  Surgeon: Laurence Spates, MD;  Location: [facility];  Service: Endoscopy;  Laterality: N/A;  . IR IMAGING GUIDED PORT INSERTION  10/07/2018  . PATCH ANGIOPLASTY Right 06/26/2018   Procedure: PATCH ANGIOPLASTY USING A XENOSURE 1cm x 6cm BIOLOGIC PATCH;  Surgeon: Serafina Mitchell, MD;  Location: [place];  Service: Vascular;  Laterality: Right;  Marland Kitchen VIDEO BRONCHOSCOPY WITH ENDOBRONCHIAL ULTRASOUND N/A 08/14/2018   Procedure: VIDEO BRONCHOSCOPY WITH ENDOBRONCHIAL ULTRASOUND;  Surgeon: Collene Gobble, MD;  Location: [place];  Service: Thoracic;  Laterality: N/A;    Social History:   reports that she quit smoking about 15 months ago. Her smoking use included cigarettes. She has a 10.00 pack-year smoking history. She has never used smokeless tobacco. She reports current alcohol use. She reports that she does not use drugs.  No Known Allergies  Family History  Problem Relation Age of Onset  . Cancer Mother        lung cancer  . Diabetes Other   . Hyperlipidemia Other   . Hypertension Other   . Cancer Other      Prior to Admission medications   Medication Sig Start Date End Date Taking? Authorizing Provider  acetaminophen (TYLENOL) 650 MG CR tablet Take 650 mg by mouth every 8 (eight) hours as needed for pain.   Yes [provider]  amLODipine (NORVASC) 10 MG tablet TAKE 1 TABLET BY MOUTH EVERY DAY Patient taking differently:  Take 10 mg by mouth daily.  05/02/19  Yes Rutherford Guys, MD  atorvastatin (LIPITOR) 80 MG tablet TAKE 1 TABLET (80 MG TOTAL) BY MOUTH DAILY AT 6 PM. 07/24/19  Yes Rutherford Guys, MD  Clobetasol Prop Emollient Base (CLOBETASOL PROPIONATE E) 0.05 % emollient cream Apply 1 application topically 2 (two) times daily. 09/04/19  Yes Rutherford Guys, MD  DULoxetine (CYMBALTA) 30 MG capsule TAKE 1 CAPSULE BY MOUTH EVERY DAY Patient taking differently: Take 30 mg by  mouth daily.  04/25/19  Yes Rutherford Guys, MD  lisinopril (ZESTRIL) 40 MG tablet TAKE 1 TABLET BY MOUTH EVERY DAY Patient taking differently: Take 40 mg by mouth daily.  08/05/19  Yes Rutherford Guys, MD  ondansetron (ZOFRAN) 4 MG tablet Take 1 tablet (4 mg total) by mouth every 8 (eight) hours as needed for nausea or vomiting. 09/04/19  Yes Rutherford Guys, MD  pregabalin (LYRICA) 50 MG capsule TAKE 1 CAPSULE BY MOUTH 3 TIMES A DAY Patient taking differently: Take 50 mg by mouth 3 (three) times daily.  07/15/19  Yes Rutherford Guys, MD  rivaroxaban (XARELTO) 20 MG TABS tablet Take 1 tablet (20 mg total) by mouth daily with supper. 08/27/19  Yes Curt Bears, MD  traMADol (ULTRAM) 50 MG tablet Take 1-2 tablets (50-100 mg total) by mouth at bedtime as needed. 09/04/19  Yes Rutherford Guys, MD    Physical Exam: Wt Readings from Last 3 Encounters:  10/02/19 79.4 kg  08/27/19 88.6 kg  08/09/19 84 kg   Vitals:   10/02/19 1230 10/02/19 1249 10/02/19 1503 10/02/19 1515  BP: (!) 182/117 (!) 171/106 (!) 182/109 (!) 176/121  Pulse: 86 82  83  Resp: (!) 21 18  17   Temp:      TempSrc:      SpO2: 94% 93%  92%  Weight:      Height:          Constitutional:  Calm & comfortable Eyes: PERRLA, lids and conjunctivae normal ENT:  Mucous membranes are moist.  Pharynx clear of exudate   Normal dentition.  Neck: Supple, no masses  Respiratory:  Clear to auscultation bilaterally  Normal respiratory effort.  Cardiovascular:  S1 & S2 heard, regular rate and rhythm No Murmurs Abdomen:  Non distended No tenderness, No masses Bowel sounds normal Extremities:  No clubbing / cyanosis No pedal edema No joint deformity    Skin:  No rashes, lesions or ulcers Neurologic:  AAO x 3 CN 2-12 grossly intact Sensation intact Strength 5/5 in all 4 extremities Psychiatric:  Normal Mood and affect    Labs on Admission: I have personally reviewed following labs and imaging  studies  CBC: Recent Labs  Lab 10/02/19 1133  WBC 7.0  NEUTROABS 5.9  HGB 11.0*  HCT 35.3*  MCV 105.1*  PLT 595   Basic Metabolic Panel: Recent Labs  Lab 10/02/19 1133  NA 141  K 3.6  CL 107  CO2 25  GLUCOSE 117*  BUN 20  CREATININE 1.55*  CALCIUM 9.2   GFR: Estimated Creatinine Clearance: 37.9 mL/min (A) (by C-G formula based on SCr of 1.55 mg/dL (H)). Liver Function Tests: Recent Labs  Lab 10/02/19 1133  AST 16  ALT 18  ALKPHOS 60  BILITOT 0.7  PROT 6.6  ALBUMIN 3.9   Recent Labs  Lab 10/02/19 1133  LIPASE 18   No results for input(s): AMMONIA in the last 168 hours. Coagulation Profile: No results for input(s): INR,  PROTIME in the last 168 hours. Cardiac Enzymes: No results for input(s): CKTOTAL, CKMB, CKMBINDEX, TROPONINI in the last 168 hours. BNP (last 3 results) No results for input(s): PROBNP in the last 8760 hours. HbA1C: No results for input(s): HGBA1C in the last 72 hours. CBG: No results for input(s): GLUCAP in the last 168 hours. Lipid Profile: No results for input(s): CHOL, HDL, LDLCALC, TRIG, CHOLHDL, LDLDIRECT in the last 72 hours. Thyroid Function Tests: No results for input(s): TSH, T4TOTAL, FREET4, T3FREE, THYROIDAB in the last 72 hours. Anemia Panel: No results for input(s): VITAMINB12, FOLATE, FERRITIN, TIBC, IRON, RETICCTPCT in the last 72 hours. Urine analysis:    Component Value Date/Time   COLORURINE YELLOW 10/30/2018 1209   APPEARANCEUR HAZY (A) 10/30/2018 1209   LABSPEC 1.012 10/30/2018 1209   PHURINE 5.0 10/30/2018 1209   GLUCOSEU NEGATIVE 10/30/2018 1209   HGBUR NEGATIVE 10/30/2018 1209   [place] 10/30/2018 1209   BILIRUBINUR negative 06/21/2018 1057   KETONESUR NEGATIVE 10/30/2018 1209   PROTEINUR NEGATIVE 10/30/2018 1209   UROBILINOGEN 0.2 06/21/2018 1057   NITRITE NEGATIVE 10/30/2018 1209   LEUKOCYTESUR NEGATIVE 10/30/2018 1209   Sepsis Labs: @LABRCNTIP (procalcitonin:4,lacticidven:4) )No  results found for this or any previous visit (from the past 240 hour(s)).   Radiological Exams on Admission: CT Head Wo Contrast  Result Date: 10/02/2019 CLINICAL DATA:  Headaches, on Xarelto, confusion; altered mental status unclear cause. Additional history provided: History of lung cancer diagnosed 2019. EXAM: CT HEAD WITHOUT CONTRAST TECHNIQUE: Contiguous axial images were obtained from the base of the skull through the vertex without intravenous contrast. COMPARISON:  Brain MRI 01/20/2019 FINDINGS: Brain: There is an isodense mass measuring 3.9 x 3.1 x 3.2 centered along the inferior margin of the frontal horn of the right lateral ventricle. There is mild surrounding edema within the right frontal lobe. This mass near completely effaces the right frontal horn and extends slightly across midline. 2.1 cm focus of edema within the anterior left frontal lobe at the gray-white junction (for instance as seen on series 2, image 18). There is also extensive edema throughout much of the right cerebellum extending to the region of the vermis and right middle cerebellar peduncle with associated mass effect. Associated posterior fossa mass effect with partial effacement of the fourth ventricle. There is no evidence of acute intracranial hemorrhage. No extra-axial fluid collection. Additional ill-defined hypoattenuation within the cerebral white matter is nonspecific, but consistent with chronic small vessel ischemic disease. A tiny chronic lacunar infarct within the left cerebellum was better appreciated on prior MRI. Mild generalized parenchymal atrophy. Vascular: No hyperdense vessel.  Atherosclerotic calcifications. Skull: Normal. Negative for fracture or focal lesion. Sinuses/Orbits: Visualized orbits demonstrate no acute abnormality. Mild paranasal sinus mucosal thickening. No significant mastoid effusion. These results were called by telephone at the time of interpretation on 10/02/2019 at 12:16 pm to provider  [facility] , who verbally acknowledged these results. IMPRESSION: 3.9 cm mass near completely effacing the frontal horn of the right lateral ventricle and extending slightly across midline. Mild surrounding edema within the right frontal lobe. Additional 2.1 cm focus of edema within the anterior left frontal lobe at the gray-white junction. There is also extensive edema throughout much of the right cerebellum and extending to the vermis and right middle cerebellar peduncle. Findings likely reflect multifocal intracranial metastatic disease. Contrast-enhanced brain MRI is recommended for further evaluation and to assess for additional intracranial metastases. Posterior fossa mass effect with significant effacement of the fourth ventricle. No evidence of  hydrocephalus on the current exam. Background chronic small vessel ischemic disease. Electronically Signed   By: Kellie Simmering DO   On: 10/02/2019 12:17   MR Brain W and Wo Contrast  Result Date: 10/02/2019 CLINICAL DATA:  Headaches. Altered mental status. History of lung cancer. Abnormal head CT EXAM: MRI HEAD WITHOUT AND WITH CONTRAST TECHNIQUE: Multiplanar, multiecho pulse sequences of the brain and surrounding structures were obtained without and with intravenous contrast. CONTRAST:  81mL GADAVIST GADOBUTROL 1 MMOL/ML IV SOLN COMPARISON:  Head CT 10/02/2019.  MRI 01/20/2019 FINDINGS: Brain: Necrotic metastasis in the right cerebellum measuring 4.6 x 4.1 x 3.1 Cm. Vasogenic edema with mass effect. Narrowing of the fourth ventricle but no evidence complete obstruction. Solid metastasis with remarkable lack of necrosis in the right frontal region, possibly with the epicenter in the caudate head, measuring 3.6 x 3.5 x 3.4 cm. Surrounding vasogenic edema. Mass effect with flattening of the frontal horn of the right lateral ventricle. Right-to-left midline shift at this level of 4 mm. Generalized right-to-left shift is not present. Necrotic metastasis in the left  frontal lobe measuring 2.4 x 1.9 x 2.3 cm. No associated vasogenic edema. Probable nodular ependymal metastasis along the medial wall of the left lateral ventricle, axial image 31. Question second ependymal metastasis at the floor of the left lateral ventricle, axial image 27. This may simply be a vessel. Elsewhere, there are moderate chronic small-vessel ischemic changes of the cerebral hemispheric white matter. No extra-axial fluid collection. Vascular: Major vessels at the base of the brain show flow. Skull and upper cervical spine: Negative Sinuses/Orbits: Clear/normal Other: None IMPRESSION: 4.6 x 4.1 x 3.1 cm necrotic metastasis within the right cerebellum with vasogenic edema and mass effect. 3.6 x 3.5 x 3.4 cm solid metastasis in the right frontal region, possibly with the epicenter in the caudate head. Vasogenic edema and mass effect. 2.4 x 1.9 x 2.3 cm partially necrotic metastasis in the left frontal lobe without vasogenic edema. Two or 3 mm ependymal metastasis along the medial wall of the left lateral ventricle, axial image 31. Question punctate metastasis along the floor of the left lateral ventricle axial image 27. This may simply be a vessel. Electronically Signed   By: Nelson Chimes M.D.   On: 10/02/2019 14:14   DG Chest Portable 1 View  Result Date: 10/02/2019 CLINICAL DATA:  Weakness and shortness of breath. History of lung cancer. EXAM: PORTABLE CHEST 1 VIEW COMPARISON:  Chest CT 08/09/2019. Chest x-ray 10/29/2018 FINDINGS: 1130 hours. Left lung clear. Volume loss noted right hemithorax, as before. Right paramediastinal and hilar opacity on today's chest x-ray compatible with treatment related changes noted on recent CT scan. Consolidative opacity in the right lower lobe and small right pleural effusion are similar. Right Port-A-Cath again noted. Telemetry leads overlie the chest. IMPRESSION: Volume loss right hemithorax with right paramediastinal and parahilar opacity consistent neoplastic  and post treatment changes noted on recent chest CT. No pulmonary edema or pneumothorax. Electronically Signed   By: Misty Stanley M.D.   On: 10/02/2019 12:14    EKG: Independently reviewed.  Normal sinus rhythm  Assessment/Plan Principal Problem:   Hypertensive urgency-in a patient who typically has a well-controlled blood pressure -The patient is found to have metastasis in her brain and due to the fact that she is on Xarelto, she is already a high risk for bleeding into these-in addition with a blood pressure that is severely uncontrolled, she is a higher risk for intracranial hemorrhage-and, in addition to  this the patient is going to be started on IV steroids for her brain metastasis and will have side effects of increasing blood pressure due to the steroids and thus it is felt that she needs an admission to the hospital to control her blood pressure in the most safe manner -She has responded well to IV labetalol and thus I will be starting oral labetalol with as needed IV labetalol - I will continue lisinopril and amlodipine -We will slowly titrate her medications until an optimal blood pressure is achieved  Active Problems:   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 Earlie Server and she is surprised that she has developed new brain metastasis -The case was discussed between the ED and Dr. Mickeal Skinner the neuro oncologist who recommended initiation of Decadron after doing the MRI -Will need an outpatient follow-up scheduled for Dr. Mickeal Skinner that he will arrange - Addendum: dr Mickeal Skinner recommends d/c home on 4 mg Decadron a day and to continue Xarelto for now- he will discuss he case in Tumor board this Monday  Recent jugular venous thrombosis -According to the patient and the husband, this blood clot is not  related to her Port-A-Cath -Continue Xarelto    CKD (chronic kidney disease) stage 3, GFR 30-59 ml/min -This is stable    Port-A-Cath in place     DVT prophylaxis: Xarelto Code Status: Full code-of note she was previously a DNR but has changed this today Family Communication: Husband at bedside Disposition Plan: From home-we will likely be able to go back home Consults called: phone consult with Dr Mickeal Skinner Admission status: Observation   Debbe Odea MD [facility] Pager: [email or web address] Password [email or web address] 7PM-7AM, please contact night-coverage   10/02/2019, 3:31 PM

## 2019-10-03 DIAGNOSIS — Z801 Family history of malignant neoplasm of trachea, bronchus and lung: Secondary | ICD-10-CM | POA: Diagnosis not present

## 2019-10-03 DIAGNOSIS — M25512 Pain in left shoulder: Secondary | ICD-10-CM | POA: Diagnosis present

## 2019-10-03 DIAGNOSIS — I674 Hypertensive encephalopathy: Secondary | ICD-10-CM | POA: Diagnosis present

## 2019-10-03 DIAGNOSIS — I160 Hypertensive urgency: Secondary | ICD-10-CM

## 2019-10-03 DIAGNOSIS — C3431 Malignant neoplasm of lower lobe, right bronchus or lung: Secondary | ICD-10-CM

## 2019-10-03 DIAGNOSIS — T451X5A Adverse effect of antineoplastic and immunosuppressive drugs, initial encounter: Secondary | ICD-10-CM | POA: Diagnosis present

## 2019-10-03 DIAGNOSIS — N179 Acute kidney failure, unspecified: Secondary | ICD-10-CM | POA: Diagnosis present

## 2019-10-03 DIAGNOSIS — N1832 Chronic kidney disease, stage 3b: Secondary | ICD-10-CM | POA: Diagnosis not present

## 2019-10-03 DIAGNOSIS — Z87891 Personal history of nicotine dependence: Secondary | ICD-10-CM | POA: Diagnosis not present

## 2019-10-03 DIAGNOSIS — Z9181 History of falling: Secondary | ICD-10-CM | POA: Diagnosis not present

## 2019-10-03 DIAGNOSIS — Z86718 Personal history of other venous thrombosis and embolism: Secondary | ICD-10-CM | POA: Diagnosis not present

## 2019-10-03 DIAGNOSIS — I129 Hypertensive chronic kidney disease with stage 1 through stage 4 chronic kidney disease, or unspecified chronic kidney disease: Secondary | ICD-10-CM | POA: Diagnosis present

## 2019-10-03 DIAGNOSIS — I161 Hypertensive emergency: Secondary | ICD-10-CM | POA: Diagnosis present

## 2019-10-03 DIAGNOSIS — D649 Anemia, unspecified: Secondary | ICD-10-CM | POA: Diagnosis present

## 2019-10-03 DIAGNOSIS — G620 Drug-induced polyneuropathy: Secondary | ICD-10-CM | POA: Diagnosis present

## 2019-10-03 DIAGNOSIS — Z7901 Long term (current) use of anticoagulants: Secondary | ICD-10-CM | POA: Diagnosis not present

## 2019-10-03 DIAGNOSIS — G9340 Encephalopathy, unspecified: Secondary | ICD-10-CM

## 2019-10-03 DIAGNOSIS — N1831 Chronic kidney disease, stage 3a: Secondary | ICD-10-CM | POA: Diagnosis present

## 2019-10-03 DIAGNOSIS — Z20822 Contact with and (suspected) exposure to covid-19: Secondary | ICD-10-CM | POA: Diagnosis present

## 2019-10-03 DIAGNOSIS — G936 Cerebral edema: Secondary | ICD-10-CM | POA: Diagnosis present

## 2019-10-03 DIAGNOSIS — C7931 Secondary malignant neoplasm of brain: Secondary | ICD-10-CM | POA: Diagnosis present

## 2019-10-03 DIAGNOSIS — Z95828 Presence of other vascular implants and grafts: Secondary | ICD-10-CM | POA: Diagnosis not present

## 2019-10-03 DIAGNOSIS — E785 Hyperlipidemia, unspecified: Secondary | ICD-10-CM | POA: Diagnosis present

## 2019-10-03 DIAGNOSIS — Z8673 Personal history of transient ischemic attack (TIA), and cerebral infarction without residual deficits: Secondary | ICD-10-CM | POA: Diagnosis not present

## 2019-10-03 LAB — BASIC METABOLIC PANEL
Anion gap: 7 (ref 5–15)
BUN: 26 mg/dL — ABNORMAL HIGH (ref 8–23)
CO2: 25 mmol/L (ref 22–32)
Calcium: 9.1 mg/dL (ref 8.9–10.3)
Chloride: 106 mmol/L (ref 98–111)
Creatinine, Ser: 1.61 mg/dL — ABNORMAL HIGH (ref 0.44–1.00)
GFR calc Af Amer: 38 mL/min — ABNORMAL LOW (ref >60)
GFR calc non Af Amer: 33 mL/min — ABNORMAL LOW (ref >60)
Glucose, Bld: 140 mg/dL — ABNORMAL HIGH (ref 70–99)
Potassium: 4 mmol/L (ref 3.5–5.1)
Sodium: 138 mmol/L (ref 135–145)

## 2019-10-03 LAB — URINALYSIS, ROUTINE W REFLEX MICROSCOPIC
Bilirubin Urine: NEGATIVE
Glucose, UA: NEGATIVE mg/dL
Hgb urine dipstick: NEGATIVE
Ketones, ur: NEGATIVE mg/dL
Nitrite: NEGATIVE
Protein, ur: NEGATIVE mg/dL
Specific Gravity, Urine: 1.013 (ref 1.005–1.030)
pH: 7 (ref 5.0–8.0)

## 2019-10-03 LAB — CBC
HCT: 33.9 % — ABNORMAL LOW (ref 36.0–46.0)
Hemoglobin: 10.8 g/dL — ABNORMAL LOW (ref 12.0–15.0)
MCH: 32.9 pg (ref 26.0–34.0)
MCHC: 31.9 g/dL (ref 30.0–36.0)
MCV: 103.4 fL — ABNORMAL HIGH (ref 80.0–100.0)
Platelets: 172 10*3/uL (ref 150–400)
RBC: 3.28 MIL/uL — ABNORMAL LOW (ref 3.87–5.11)
RDW: 13.1 % (ref 11.5–15.5)
WBC: 5.4 10*3/uL (ref 4.0–10.5)
nRBC: 0 % (ref 0.0–0.2)

## 2019-10-03 LAB — HIV ANTIBODY (ROUTINE TESTING W REFLEX): HIV Screen 4th Generation wRfx: NONREACTIVE

## 2019-10-03 LAB — SARS CORONAVIRUS 2 (TAT 6-24 HRS): SARS Coronavirus 2: NEGATIVE

## 2019-10-03 MED ORDER — ENSURE ENLIVE PO LIQD
237.0000 mL | Freq: Three times a day (TID) | ORAL | Status: DC
  Administered 2019-10-03: 237 mL via ORAL

## 2019-10-03 MED ORDER — SODIUM CHLORIDE 0.9 % IV SOLN: INTRAVENOUS | Status: DC

## 2019-10-03 MED ORDER — SODIUM CHLORIDE 0.9% FLUSH
10.0000 mL | Freq: Two times a day (BID) | INTRAVENOUS | Status: DC
  Administered 2019-10-03 – 2019-10-04 (×2): 10 mL

## 2019-10-03 MED ORDER — SODIUM CHLORIDE 0.9% FLUSH
10.0000 mL | INTRAVENOUS | Status: DC | PRN
  Administered 2019-10-05: 10 mL

## 2019-10-03 MED ORDER — HYDRALAZINE HCL 25 MG PO TABS: 25.0000 mg | ORAL_TABLET | Freq: Three times a day (TID) | ORAL | Status: DC

## 2019-10-03 NOTE — Progress Notes (Signed)
Initial Nutrition Assessment  DOCUMENTATION CODES:   Not applicable  INTERVENTION:  Ensure Enlive po TID, each supplement provides 350 kcal and 20 grams of protein  Magic cup BID with meals, each supplement provides 290 kcal and 9 grams of protein   NUTRITION DIAGNOSIS:   Increased nutrient needs related to cancer and cancer related treatments(SCLC s/p chemotherapy with new metastases on head CT) as evidenced by estimated needs.  GOAL:   Patient will meet greater than or equal to 90% of their needs    MONITOR:   PO intake, Weight trends, Supplement acceptance, I & O's, Labs  REASON FOR ASSESSMENT:   Malnutrition Screening Tool    ASSESSMENT:  RD working remotely.  69 year old female with past medical history of SCLC s/p chemotherapy, HTN, jugular vein thrombosis, and peripheral neuropathy who presented to ED with several day history of fatigue, malaise,  2 days of severely elevated BP despite taking usual medications and reports worsening headache with altered mental status. On arrival BP was 210/139 and appeared confused. CT head showed new multiple metastases confirmed and better defined by subsequent MRI including necrosis and vasogenic edema.  Patient admitted on 3/19 for hypertensive urgency.  Unable to obtain nutrition history at this time, per chart patient remains with altered mentation and feeling foggy headed. She is on a regular diet and per flowsheets, has consumed 25% x 1 documented meal this admission. Will continue to monitor po intakes and provide Ensure and Magic Cup supplements to aid with estimated needs.  Per notes: -acute encephalopathy -resolution of hallucinations, not back to baseline per patient's partner -peripheral neuropathy due to chemotherapy -BP correction has been brisk with rise in CR -concern for overly rapid correction possibly leading to ATN; monitor UOP and BMP; holding lisinopril  I/Os: -575 ml since admit UOP: 575 ml since  admit  Current wt 174.68 lbs Per history, on 04/03/19 pt wt 85.3 kg (187.66 lbs), on 05/29/19 pt wt 84.3 kg (185.46 lbs), on 08/09/19 pt wt 84 kg (184.8 lbs), on 08/27/19 pt wt 88.6 kg (194.93 lbs). Patient has lost 13 lbs (7%) over the last 6 months which is insignificant for time frame, however concerning given patient history and is at risk for malnutrition.   Medications reviewed and include: decadron, cymbalta, lyrica, xarelto IVF: NaCl Labs: BG 140, 117, BUN 26 (H), Cr 1.61 (H)  NUTRITION - FOCUSED PHYSICAL EXAM: Unable to complete at this time, RD working remotely.   Diet Order:   Diet Order            Diet regular Room service appropriate? Yes; Fluid consistency: Thin  Diet effective now              EDUCATION NEEDS:   No education needs have been identified at this time  Skin:  Skin Assessment: Reviewed RN Assessment  Last BM:  3/17  Height:   Ht Readings from Last 1 Encounters:  10/02/19 5\' 6"  (1.676 m)    Weight:   Wt Readings from Last 1 Encounters:  10/02/19 79.4 kg    Ideal Body Weight:  59.1 kg  BMI:  Body mass index is 28.25 kg/m.  Estimated Nutritional Needs:   Kcal:  2100-2300  Protein:  105-115  Fluid:  >/= 2.1 L/day   Lajuan Lines, RD, LDN Clinical Nutrition After Hours/Weekend Pager # in [place]

## 2019-10-03 NOTE — Evaluation (Addendum)
Physical Therapy Evaluation Patient Details Name: [name] MRN: [id number] DOB: 22-Jan-1951 Today's Date: 10/03/2019   History of Present Illness  69 y.o. female with a history of SCLC s/p chemotherapy, HTN, jugular vein thrombosis, and peripheral neuropathy who presented to the ED with several days of fatigue, malaise, 2 days of severely elevated BP despite taking her usual medications, and worsening headache with altered mental status. Imaging showed new brain metastases.  Clinical Impression  Pt admitted with above diagnosis. Supine to sit with mod assist, pt dizzy in sitting, BP 83/63 sitting (see flowsheets), dizziness resolved after 2-3 minutes sitting. Stand pivot transfer with min A. Pt reported dizziness in standing and was unable to stand long enough to obtain BP reading. Pt has 24* assist available at home, she and her partner declined HHPT.  Pt currently with functional limitations due to the deficits listed below (see PT Problem List). Pt will benefit from skilled PT to increase their independence and safety with mobility to allow discharge to the venue listed below.       Follow Up Recommendations Home health PT recommended;Supervision for mobility/OOB;Supervision/Assistance - 24 hour(pt/partner declined HHPT)    Equipment Recommendations  3in1 (PT), transfer tub bench (pt/partner plan to order tub bench on [employer])   Recommendations for Other Services       Precautions / Restrictions Precautions Precautions: Fall Precaution Comments: 2 falls in past 2 weeks; prior to that no falls in past 1 year Restrictions Weight Bearing Restrictions: No      Mobility  Bed Mobility Overal bed mobility: Needs Assistance Bed Mobility: Supine to Sit     Supine to sit: Mod assist     General bed mobility comments: assist to raise trunk; dizzy initially upon sitting, dizziness resolved after sitting for 2-3 minutes. BP 83/63 sitting (see flowsheets)  Transfers Overall  transfer level: Needs assistance Equipment used: Rolling walker (2 wheeled) Transfers: Sit to/from [facility] Sit to Stand: Min assist Stand pivot transfers: Min assist       General transfer comment: VCs hand placement, assist to rise/steady; dizzy in standing, not able to stand long enough to get standing BP  Ambulation/Gait                Stairs            Wheelchair Mobility    Modified Rankin (Stroke Patients Only)       Balance Overall balance assessment: Needs assistance Sitting-balance support: Feet supported;Single extremity supported Sitting balance-Leahy Scale: Fair     Standing balance support: Bilateral upper extremity supported Standing balance-Leahy Scale: Poor                               Pertinent Vitals/Pain Pain Assessment: Faces Faces Pain Scale: Hurts little more Pain Location: headache Pain Descriptors / Indicators: Aching Pain Intervention(s): Limited activity within patient's tolerance;Monitored during session;Premedicated before session    Home Living Family/patient expects to be discharged to:: Private residence Living Arrangements: Spouse/significant other Available Help at Discharge: Family;Available 24 hours/day Type of Home: House Home Access: Stairs to enter   [employer] of Steps: 2 Home Layout: Multi-level Home Equipment: Shower seat;Wheelchair - [employer] - 2 wheels Additional Comments: 2 falls in past 2 weeks    Prior Function Level of Independence: Independent         Comments: independent until 1 week ago, past week has needed A to walk and held on to  walls     Hand Dominance        Extremity/Trunk Assessment   Upper Extremity Assessment Upper Extremity Assessment: Overall WFL for tasks assessed    Lower Extremity Assessment Lower Extremity Assessment: Overall WFL for tasks assessed(h/o peripheral neuropathy B feet 2* chemo)    Cervical / Trunk  Assessment Cervical / Trunk Assessment: Normal  Communication   Communication: No difficulties  Cognition Arousal/Alertness: Awake/alert Behavior During Therapy: WFL for tasks assessed/performed Overall Cognitive Status: Impaired/Different from baseline Area of Impairment: Memory                               General Comments: oriented to self and location, partner provided home and prior level of function info as pt is vague historian, follows commands well      General Comments      Exercises     Assessment/Plan    PT Assessment Patient needs continued PT services  PT Problem List Decreased activity tolerance;Decreased balance;Decreased mobility;Decreased knowledge of use of DME;Decreased cognition       PT Treatment Interventions Therapeutic activities;Therapeutic exercise;Balance training;Functional mobility training    PT Goals (Current goals can be found in the Care Plan section)  Acute Rehab PT Goals Patient Stated Goal: likes to cook PT Goal Formulation: With patient/family Time For Goal Achievement: 10/17/19 Potential to Achieve Goals: Fair    Frequency Min 3X/week   Barriers to discharge        Co-evaluation               AM-PAC PT "6 Clicks" Mobility  Outcome Measure Help needed turning from your back to your side while in a flat bed without using bedrails?: A Little Help needed moving from lying on your back to sitting on the side of a flat bed without using bedrails?: A Lot Help needed moving to and from a bed to a chair (including a wheelchair)?: A Little Help needed standing up from a chair using your arms (e.g., wheelchair or bedside chair)?: A Little Help needed to walk in hospital room?: Total Help needed climbing 3-5 steps with a railing? : Total 6 Click Score: 13    End of Session Equipment Utilized During Treatment: Gait belt Activity Tolerance: Treatment limited secondary to medical complications (Comment)(dizziness in  sit and stand) Patient left: in chair;with call bell/phone within reach;with family/visitor present Nurse Communication: Mobility status; hypotension/dizziness PT Visit Diagnosis: Unsteadiness on feet (R26.81);History of falling (Z91.81);Difficulty in walking, not elsewhere classified (R26.2)    Time: 1316-1340 PT Time Calculation (min) (ACUTE ONLY): 24 min   Charges:   PT Evaluation $PT Eval Low Complexity: 1 Low PT Treatments $Therapeutic Activity: 8-22 mins        Blondell Reveal Kistler PT 10/03/2019  Acute Rehabilitation Services Pager [phone] Office [phone]

## 2019-10-03 NOTE — Progress Notes (Signed)
PROGRESS NOTE  [name]  EHM:[id number] DOB: [birth year] DOA: 10/02/2019 PCP: Rutherford Guys, MD  Outpatient Specialists: Oncology, Dr. Julien Nordmann Brief Narrative: [name] is a 69 y.o. female with a history of SCLC s/p chemotherapy, HTN, jugular vein thrombosis, and peripheral neuropathy who presented to the ED with several days of fatigue, malaise, 2 days of severely elevated BP despite taking her usual medications, and worsening headache with altered mental status. On arrival BP was 210/139, and she appeared to be confused. Creatinine 1.55, urinalysis unremarkable. CT head showed new multiple metastases confirmed and better defined by subsequent MRI including necrosis and vasogenic edema. Admission was requested. Decadron started, hydralazine and labetalol added to norvasc and lisinopril with quick improvement in HTN. Mentation remains abnormal.  Assessment & Plan: Principal Problem:   Hypertensive urgency Active Problems:   Small cell carcinoma of lower lobe of right lung (HCC)   Symptomatic anemia   CKD (chronic kidney disease) stage 3, GFR 30-59 ml/min   Port-A-Cath in place   Acute encephalopathy  Hypertensive emergency:  - Norvasc 10mg  continued - Lisinopril 40mg  continued. With rise in creatinine and precipitous improvement in BP (~366QHUT systolic) over 12 hours, there is concern for overly rapid correction possibly leading to ATN. Will monitor UOP and BMP in AM. Hold lisinopril (was given this AM already) - Continue hydralazine, decrease dose to 25mg  q8h - Continue labetalol 100mg  BID  SCLC with newly diagnosed multiple brain metastases with vasogenic edema and necrosis: Review of chart shows offer of PCI which was declined and subsequent MRI brain in 2020 showed no evidence of metastases.  - Oncology consulted, had completed Tx with chemotherapy per Dr. Julien Nordmann. - Continue decadron here and at discharge.   Right jugular vein thrombosis:  - Continue xarelto  (ok per neuro-oncology)  Acute encephalopathy: Multifactorial related to HTN emergency, brain metastases. Improved with resolution of hallucinations though patient's partner of >50 years states she's not back to her usual amount of interactiveness and somewhat more inattentive than usual. - Continue delirium precautions and close monitoring.   Stage IIIa CKD:  - Monitor  Port-a-cath in place: Noted. Thrombosis not felt to be related to this.  Peripheral neuropathy: Due to chemotherapy.  - Continue lyrica, cymbalta.  DVT prophylaxis: Xarelto Code Status: Full (previously DNR) Family Communication: Partner at bedside Disposition Plan: Patient from home and likely to return there possibly with [facility] services. She remains with altered mentation from baseline and BP correction has been brisk with concomitant rise in creatinine while continuing ACE inhibitor. We requires ongoing close BP monitoring including orthostatics, titration of blood pressure medications, and monitoring of renal function in AM. Will get PT/OT, and anticipate DC once renal function stable, BP no longer elevated in severe range and mentation back at baseline.  Consultants:   Neurooncology, Dr. Mickeal Skinner by phone.  Oncology  Procedures:   None  Antimicrobials:  None   Subjective: Headache is improved but remains mild, global, constant. Her thinking is much more clear, though she is less attentive and interactive than her usual baseline per partner. She feels foggy headed still. No chest pain, dyspnea, palpitations. Eating ok. Urinating normally.  Objective: Vitals:   10/02/19 1736 10/02/19 2106 10/02/19 2359 10/03/19 0243  BP: (!) 157/94 113/80 103/78 106/83  Pulse: 89 92 92 83  Resp:  18 18 16   Temp: 97.6 F (36.4 C) 98.1 F (36.7 C) 97.8 F (36.6 C) 97.7 F (36.5 C)  TempSrc: Oral Oral    SpO2: 98%  97% 95% 95%  Weight:      Height:        Intake/Output Summary (Last 24 hours) at 10/03/2019 1037 Last data  filed at 10/02/2019 2310 Gross per 24 hour  Intake --  Output 575 ml  Net -575 ml   Filed Weights   10/02/19 1046  Weight: 79.4 kg    Gen: 69 y.o. female in no distress  Pulm: Non-labored breathing. Clear to auscultation bilaterally.  CV: Regular rate and rhythm. No murmur, rub, or gallop. No JVD, no pedal edema. GI: Abdomen soft, non-tender, non-distended, with normoactive bowel sounds. No organomegaly or masses felt. Ext: Warm, no deformities Skin: No rashes, lesions or ulcers. Port site c/d/i, nontender, no erythema. Neuro: Alert and oriented. Has stocking-glove distribution numbness which is chronic since chemotherapy, no weakness noted.  Psych: Judgement and insight appear normal. Mood & affect appropriate.   Data Reviewed: I have personally reviewed following labs and imaging studies  CBC: Recent Labs  Lab 10/02/19 1133 10/03/19 0500  WBC 7.0 5.4  NEUTROABS 5.9  --   HGB 11.0* 10.8*  HCT 35.3* 33.9*  MCV 105.1* 103.4*  PLT 168 478   Basic Metabolic Panel: Recent Labs  Lab 10/02/19 1133 10/03/19 0500  NA 141 138  K 3.6 4.0  CL 107 106  CO2 25 25  GLUCOSE 117* 140*  BUN 20 26*  CREATININE 1.55* 1.61*  CALCIUM 9.2 9.1   GFR: Estimated Creatinine Clearance: 36.5 mL/min (A) (by C-G formula based on SCr of 1.61 mg/dL (H)). Liver Function Tests: Recent Labs  Lab 10/02/19 1133  AST 16  ALT 18  ALKPHOS 60  BILITOT 0.7  PROT 6.6  ALBUMIN 3.9   Recent Labs  Lab 10/02/19 1133  LIPASE 18   No results for input(s): AMMONIA in the last 168 hours. Coagulation Profile: No results for input(s): INR, PROTIME in the last 168 hours. Cardiac Enzymes: No results for input(s): CKTOTAL, CKMB, CKMBINDEX, TROPONINI in the last 168 hours. BNP (last 3 results) No results for input(s): PROBNP in the last 8760 hours. HbA1C: No results for input(s): HGBA1C in the last 72 hours. CBG: No results for input(s): GLUCAP in the last 168 hours. Lipid Profile: No results for  input(s): CHOL, HDL, LDLCALC, TRIG, CHOLHDL, LDLDIRECT in the last 72 hours. Thyroid Function Tests: No results for input(s): TSH, T4TOTAL, FREET4, T3FREE, THYROIDAB in the last 72 hours. Anemia Panel: No results for input(s): VITAMINB12, FOLATE, FERRITIN, TIBC, IRON, RETICCTPCT in the last 72 hours. Urine analysis:    Component Value Date/Time   COLORURINE YELLOW 10/02/2019 1134   APPEARANCEUR CLEAR 10/02/2019 1134   LABSPEC 1.013 10/02/2019 1134   PHURINE 7.0 10/02/2019 1134   GLUCOSEU NEGATIVE 10/02/2019 1134   HGBUR NEGATIVE 10/02/2019 1134   BILIRUBINUR NEGATIVE 10/02/2019 1134   BILIRUBINUR negative 06/21/2018 1057   KETONESUR NEGATIVE 10/02/2019 1134   PROTEINUR NEGATIVE 10/02/2019 1134   UROBILINOGEN 0.2 06/21/2018 1057   NITRITE NEGATIVE 10/02/2019 1134   LEUKOCYTESUR TRACE (A) 10/02/2019 1134   No results found for this or any previous visit (from the past 240 hour(s)).    Radiology Studies: CT Head Wo Contrast  Result Date: 10/02/2019 CLINICAL DATA:  Headaches, on Xarelto, confusion; altered mental status unclear cause. Additional history provided: History of lung cancer diagnosed 2019. EXAM: CT HEAD WITHOUT CONTRAST TECHNIQUE: Contiguous axial images were obtained from the base of the skull through the vertex without intravenous contrast. COMPARISON:  Brain MRI 01/20/2019 FINDINGS: Brain: There is an  isodense mass measuring 3.9 x 3.1 x 3.2 centered along the inferior margin of the frontal horn of the right lateral ventricle. There is mild surrounding edema within the right frontal lobe. This mass near completely effaces the right frontal horn and extends slightly across midline. 2.1 cm focus of edema within the anterior left frontal lobe at the gray-white junction (for instance as seen on series 2, image 18). There is also extensive edema throughout much of the right cerebellum extending to the region of the vermis and right middle cerebellar peduncle with associated mass  effect. Associated posterior fossa mass effect with partial effacement of the fourth ventricle. There is no evidence of acute intracranial hemorrhage. No extra-axial fluid collection. Additional ill-defined hypoattenuation within the cerebral white matter is nonspecific, but consistent with chronic small vessel ischemic disease. A tiny chronic lacunar infarct within the left cerebellum was better appreciated on prior MRI. Mild generalized parenchymal atrophy. Vascular: No hyperdense vessel.  Atherosclerotic calcifications. Skull: Normal. Negative for fracture or focal lesion. Sinuses/Orbits: Visualized orbits demonstrate no acute abnormality. Mild paranasal sinus mucosal thickening. No significant mastoid effusion. These results were called by telephone at the time of interpretation on 10/02/2019 at 12:16 pm to provider [facility] , who verbally acknowledged these results. IMPRESSION: 3.9 cm mass near completely effacing the frontal horn of the right lateral ventricle and extending slightly across midline. Mild surrounding edema within the right frontal lobe. Additional 2.1 cm focus of edema within the anterior left frontal lobe at the gray-white junction. There is also extensive edema throughout much of the right cerebellum and extending to the vermis and right middle cerebellar peduncle. Findings likely reflect multifocal intracranial metastatic disease. Contrast-enhanced brain MRI is recommended for further evaluation and to assess for additional intracranial metastases. Posterior fossa mass effect with significant effacement of the fourth ventricle. No evidence of hydrocephalus on the current exam. Background chronic small vessel ischemic disease. Electronically Signed   By: Kellie Simmering DO   On: 10/02/2019 12:17   MR Brain W and Wo Contrast  Result Date: 10/02/2019 CLINICAL DATA:  Headaches. Altered mental status. History of lung cancer. Abnormal head CT EXAM: MRI HEAD WITHOUT AND WITH CONTRAST TECHNIQUE:  Multiplanar, multiecho pulse sequences of the brain and surrounding structures were obtained without and with intravenous contrast. CONTRAST:  50mL GADAVIST GADOBUTROL 1 MMOL/ML IV SOLN COMPARISON:  Head CT 10/02/2019.  MRI 01/20/2019 FINDINGS: Brain: Necrotic metastasis in the right cerebellum measuring 4.6 x 4.1 x 3.1 Cm. Vasogenic edema with mass effect. Narrowing of the fourth ventricle but no evidence complete obstruction. Solid metastasis with remarkable lack of necrosis in the right frontal region, possibly with the epicenter in the caudate head, measuring 3.6 x 3.5 x 3.4 cm. Surrounding vasogenic edema. Mass effect with flattening of the frontal horn of the right lateral ventricle. Right-to-left midline shift at this level of 4 mm. Generalized right-to-left shift is not present. Necrotic metastasis in the left frontal lobe measuring 2.4 x 1.9 x 2.3 cm. No associated vasogenic edema. Probable nodular ependymal metastasis along the medial wall of the left lateral ventricle, axial image 31. Question second ependymal metastasis at the floor of the left lateral ventricle, axial image 27. This may simply be a vessel. Elsewhere, there are moderate chronic small-vessel ischemic changes of the cerebral hemispheric white matter. No extra-axial fluid collection. Vascular: Major vessels at the base of the brain show flow. Skull and upper cervical spine: Negative Sinuses/Orbits: Clear/normal Other: None IMPRESSION: 4.6 x 4.1 x 3.1 cm  necrotic metastasis within the right cerebellum with vasogenic edema and mass effect. 3.6 x 3.5 x 3.4 cm solid metastasis in the right frontal region, possibly with the epicenter in the caudate head. Vasogenic edema and mass effect. 2.4 x 1.9 x 2.3 cm partially necrotic metastasis in the left frontal lobe without vasogenic edema. Two or 3 mm ependymal metastasis along the medial wall of the left lateral ventricle, axial image 31. Question punctate metastasis along the floor of the left  lateral ventricle axial image 27. This may simply be a vessel. Electronically Signed   By: Nelson Chimes M.D.   On: 10/02/2019 14:14   DG Chest Portable 1 View  Result Date: 10/02/2019 CLINICAL DATA:  Weakness and shortness of breath. History of lung cancer. EXAM: PORTABLE CHEST 1 VIEW COMPARISON:  Chest CT 08/09/2019. Chest x-ray 10/29/2018 FINDINGS: 1130 hours. Left lung clear. Volume loss noted right hemithorax, as before. Right paramediastinal and hilar opacity on today's chest x-ray compatible with treatment related changes noted on recent CT scan. Consolidative opacity in the right lower lobe and small right pleural effusion are similar. Right Port-A-Cath again noted. Telemetry leads overlie the chest. IMPRESSION: Volume loss right hemithorax with right paramediastinal and parahilar opacity consistent neoplastic and post treatment changes noted on recent chest CT. No pulmonary edema or pneumothorax. Electronically Signed   By: Misty Stanley M.D.   On: 10/02/2019 12:14    Scheduled Meds: . amLODipine  10 mg Oral Daily  . Chlorhexidine Gluconate Cloth  6 each Topical Daily  . dexamethasone (DECADRON) injection  4 mg Intravenous Q6H  . DULoxetine  30 mg Oral Daily  . hydrALAZINE  25 mg Oral Q8H  . labetalol  100 mg Oral BID  . pregabalin  50 mg Oral TID  . rivaroxaban  20 mg Oral Q supper  . sodium chloride flush  10-40 mL Intracatheter Q12H  . sodium chloride flush  10-40 mL Intracatheter Q12H   Continuous Infusions: . sodium chloride       LOS: 0 days   Time spent: 35 minutes.  Patrecia Pour, MD [facility] [email or web address] 10/03/2019, 10:37 AM

## 2019-10-04 LAB — BASIC METABOLIC PANEL
Anion gap: 10 (ref 5–15)
BUN: 50 mg/dL — ABNORMAL HIGH (ref 8–23)
CO2: 22 mmol/L (ref 22–32)
Calcium: 8.7 mg/dL — ABNORMAL LOW (ref 8.9–10.3)
Chloride: 106 mmol/L (ref 98–111)
Creatinine, Ser: 2.8 mg/dL — ABNORMAL HIGH (ref 0.44–1.00)
GFR calc Af Amer: 20 mL/min — ABNORMAL LOW (ref >60)
GFR calc non Af Amer: 17 mL/min — ABNORMAL LOW (ref >60)
Glucose, Bld: 149 mg/dL — ABNORMAL HIGH (ref 70–99)
Potassium: 4.3 mmol/L (ref 3.5–5.1)
Sodium: 138 mmol/L (ref 135–145)

## 2019-10-04 LAB — URINE CULTURE: Culture: 30000 — AB

## 2019-10-04 MED ORDER — SODIUM CHLORIDE 0.45 % IV SOLN: INTRAVENOUS | Status: DC

## 2019-10-04 MED ORDER — AMLODIPINE BESYLATE 5 MG PO TABS: 5.0000 mg | ORAL_TABLET | Freq: Every day | ORAL | Status: DC

## 2019-10-04 NOTE — Evaluation (Signed)
Occupational Therapy Evaluation Patient Details Name: [name] MRN: [id number] DOB: [birth year] Today's Date: 10/04/2019    History of Present Illness 69 y.o. female with a history of SCLC s/p chemotherapy, HTN, jugular vein thrombosis, and peripheral neuropathy who presented to the ED with several days of fatigue, malaise, 2 days of severely elevated BP despite taking her usual medications, and worsening headache with altered mental status. Imaging showed new brain metastases.   Clinical Impression   Pt admitted with the above diagnoses and presents with below problem list. Pt will benefit from continued acute OT to address the below listed deficits and maximize independence with basic ADLs prior to d/c home with SO. At baseline, pt is independent with basic ADLs, enjoys cooking. Pt presents with impaired cognition, impaired balance, and decreased activity tolerance impacting basic ADLs. Pt is currently min A with most ADLs. SO present throughout session and included in ADL and fall prevention education.      Follow Up Recommendations  Home health OT;Supervision/Assistance - 24 hour    Equipment Recommendations  3 in 1 bedside commode    Recommendations for Other Services       Precautions / Restrictions Precautions Precautions: Fall Precaution Comments: 2 falls in past 2 weeks; prior to that no falls in past 1 year Restrictions Weight Bearing Restrictions: No      Mobility Bed Mobility Overal bed mobility: Needs Assistance Bed Mobility: Sit to Supine       Sit to supine: Min guard;Min assist   General bed mobility comments: assist to control descent. assist to scoot up in bed  Transfers Overall transfer level: Needs assistance Equipment used: Rolling walker (2 wheeled) Transfers: Sit to/from Stand Sit to Stand: Min assist         General transfer comment: cues for sequencing with rw. assist to steady pt and stabilize rw. to/from EOB. Pt noted to initaiting  sitting back onto bed with side rail up.     Balance Overall balance assessment: Needs assistance Sitting-balance support: Feet supported;Single extremity supported Sitting balance-Leahy Scale: Fair     Standing balance support: Bilateral upper extremity supported Standing balance-Leahy Scale: Poor                             ADL either performed or assessed with clinical judgement   ADL Overall ADL's : Needs assistance/impaired Eating/Feeding: Set up;Sitting   Grooming: Minimal assistance;Sitting   Upper Body Bathing: Minimal assistance;Sitting   Lower Body Bathing: Minimal assistance;Sit to/from stand   Upper Body Dressing : Minimal assistance;Sitting   Lower Body Dressing: Minimal assistance;Sit to/from stand   Toilet Transfer: Min guard;Minimal assistance;Ambulation;Comfort height toilet;RW;Grab bars Toilet Transfer Details (indicate cue type and reason): simulated in the room  Toileting- Clothing Manipulation and Hygiene: Min guard;Minimal assistance;Sit to/from stand   Tub/ Shower Transfer: Minimal assistance;Ambulation;Shower seat;Rolling walker   Functional mobility during ADLs: Min guard;Minimal assistance;Rolling walker General ADL Comments: Pt completed functional mobility in the room. Max difficulty with cognitive piece of coordinating turns and navigating obstacles with rw. Assist to steady and to stabilize and direct rw. SO present throughout session and included in ADL and fall prevention education.     Vision         Perception     Praxis      Pertinent Vitals/Pain Pain Assessment: Faces Faces Pain Scale: Hurts a little bit Pain Location: unspecified Pain Descriptors / Indicators: Discomfort Pain Intervention(s): Monitored during session  Hand Dominance Right   Extremity/Trunk Assessment Upper Extremity Assessment Upper Extremity Assessment: Generalized weakness;Overall [facility] for tasks assessed   Lower Extremity Assessment Lower  Extremity Assessment: Defer to PT evaluation   Cervical / Trunk Assessment Cervical / Trunk Assessment: Normal   Communication Communication Communication: No difficulties   Cognition Arousal/Alertness: Awake/alert Behavior During Therapy: WFL for tasks assessed/performed;Flat affect Overall Cognitive Status: Impaired/Different from baseline Area of Impairment: Attention;Memory;Following commands;Awareness;Problem solving                   Current Attention Level: Focused Memory: Decreased short-term memory;Decreased recall of precautions Following Commands: Follows one step commands inconsistently;Follows one step commands with increased time   Awareness: Intellectual Problem Solving: Slow processing;Difficulty sequencing;Requires verbal cues General Comments: difficulty coordinating turns with rw (sequencing vs awareness?) indicating she planned to turn right while walking in the room but would walk straight into wall.  Intitiating sitting down onto bed with side rail up.    General Comments       Exercises     Shoulder Instructions      Home Living Family/patient expects to be discharged to:: Private residence Living Arrangements: Spouse/significant other Available Help at Discharge: Family;Available 24 hours/day Type of Home: House Home Access: Stairs to enter [employer] of Steps: 2   Home Layout: Multi-level Alternate Level Stairs-Number of Steps: 7 up to kitchen/bedroom, 6 down to den Alternate Level Stairs-Rails: Right Bathroom Shower/Tub: Teacher, early years/pre: Standard     Home Equipment: Civil engineer, contracting;Wheelchair - [employer] - 2 wheels   Additional Comments: 2 falls in past 2 weeks      Prior Functioning/Environment Level of Independence: Independent        Comments: independent until 1 week ago, past week has needed A to walk and held on to walls        OT Problem List: Decreased strength;Decreased activity  tolerance;Impaired balance (sitting and/or standing);Decreased cognition;Decreased safety awareness;Decreased knowledge of use of DME or AE;Decreased knowledge of precautions;Pain      OT Treatment/Interventions: Self-care/ADL training;Therapeutic exercise;Energy conservation;DME and/or AE instruction;Therapeutic activities;Cognitive remediation/compensation;Balance training;Patient/family education    OT Goals(Current goals can be found in the care plan section) Acute Rehab OT Goals Patient Stated Goal: likes to cook OT Goal Formulation: With patient/family Time For Goal Achievement: 10/18/19 Potential to Achieve Goals: Good ADL Goals Pt Will Perform Grooming: with set-up;sitting Pt Will Perform Upper Body Bathing: with set-up;sitting Pt Will Perform Lower Body Bathing: with modified independence;sit to/from stand Pt Will Transfer to Toilet: with modified independence;ambulating Pt Will Perform Toileting - Clothing Manipulation and hygiene: with modified independence;sit to/from stand  OT Frequency: Min 2X/week   Barriers to D/C:            Co-evaluation              AM-PAC OT "6 Clicks" Daily Activity     Outcome Measure Help from another person eating meals?: None Help from another person taking care of personal grooming?: A Little Help from another person toileting, which includes using toliet, bedpan, or urinal?: A Little Help from another person bathing (including washing, rinsing, drying)?: A Little Help from another person to put on and taking off regular upper body clothing?: A Little Help from another person to put on and taking off regular lower body clothing?: A Little 6 Click Score: 19   End of Session Equipment Utilized During Treatment: Rolling walker Nurse Communication: Mobility status;Other (comment);Precautions(cognition)  Activity Tolerance: Patient limited by fatigue;Patient  tolerated treatment well Patient left: in bed;with call bell/phone within  reach;with bed alarm set;with family/visitor present  OT Visit Diagnosis: Unsteadiness on feet (R26.81);Muscle weakness (generalized) (M62.81);Other symptoms and signs involving cognitive function;Pain                Time: 8864-8472 OT Time Calculation (min): 20 min Charges:  OT General Charges $OT Visit: 1 Visit OT Evaluation $OT Eval Low Complexity: [place], OT Acute Rehabilitation Services Pager: [phone] Office: [phone]   Hortencia Pilar 10/04/2019, 4:28 PM

## 2019-10-04 NOTE — Progress Notes (Signed)
PROGRESS NOTE  RAYNETTE ARRAS  HER:[id number] DOB: 04-Dec-1950 DOA: 10/02/2019 PCP: Rutherford Guys, MD  Outpatient Specialists: Oncology, Dr. Julien Nordmann Brief Narrative: ROMONIA YANIK is a 69 y.o. female with a history of SCLC s/p chemotherapy, HTN, jugular vein thrombosis, and peripheral neuropathy who presented to the ED with several days of fatigue, malaise, 2 days of severely elevated BP despite taking her usual medications, and worsening headache with altered mental status. On arrival BP was 210/139, and she appeared to be confused. Creatinine 1.55, urinalysis unremarkable. CT head showed new multiple metastases confirmed and better defined by subsequent MRI including necrosis and vasogenic edema. Admission was requested. Decadron started, hydralazine and labetalol added to norvasc and lisinopril with quick improvement in HTN. Mentation remains abnormal.  Assessment & Plan: Principal Problem:   Hypertensive urgency Active Problems:   Small cell carcinoma of lower lobe of right lung (HCC)   Symptomatic anemia   CKD (chronic kidney disease) stage 3, GFR 30-59 ml/min   Port-A-Cath in place   Acute encephalopathy  Hypertensive emergency: Resolved. BP was corrected briskly and has not risen again. BP this AM soft but wnl. No longer orthostatic. - Will decrease norvasc to 5mg  - Hold lisinopril as below.  - Stopped labetalol and hydralazine over the course of the previous 24 hours due to ongoing resolution of HTN.    Acute renal failure: With rise in creatinine and precipitous improvement in BP (~[id number] systolic) over 12 hours, there is concern for overly rapid correction possibly leading to ATN.  - Continue monitoring I/O, metabolic panel in AM. Pt making urine.  - Hold lisinopril - Avoid hypotension and nephrotoxins.   SCLC with newly diagnosed multiple brain metastases with vasogenic edema and necrosis: Review of chart shows offer of PCI which was declined and subsequent MRI brain  in 2020 showed no evidence of metastases.  - Oncology consulted, has follow up, had completed Tx with chemotherapy per Dr. Julien Nordmann. Has onc follow up currently scheduled 4/12. - Continue decadron here and at discharge.  - Follow up with Dr. Mickeal Skinner who will discuss patient at tumor board 3/22.  Right jugular vein thrombosis:  - Continue xarelto (ok per neuro-oncology)  Acute encephalopathy: Multifactorial related to HTN emergency, brain metastases. Still having hallucinations.  - Continue delirium precautions and close monitoring.   Stage IIIa CKD:  - Monitor  Port-a-cath in place: Noted. Thrombosis not felt to be related to this.  Peripheral neuropathy: Due to chemotherapy.  - Continue lyrica, cymbalta.  DVT prophylaxis: Xarelto Code Status: Full (previously DNR) Family Communication: Partner at bedside Disposition Plan: Patient from home and likely to return there possibly with [facility] services. Will need to monitor acute renal failure for another day, give IV fluids. Consultants:   Neurooncology, Dr. Mickeal Skinner by phone.  Oncology  Procedures:   None  Antimicrobials:  None   Subjective: Patient has no complaints, not feeling lightheaded when standing currently. She had delirium overnight talking about people who don't exist. Urinating normally.  Objective: Vitals:   10/03/19 2109 10/04/19 0533 10/04/19 0922 10/04/19 1431  BP: (!) 93/58 96/67 97/65  (!) 90/56  Pulse: 78 83 85 77  Resp: 17 15  20   Temp: 97.8 F (36.6 C) 97.8 F (36.6 C) 98.3 F (36.8 C) (!) 97.5 F (36.4 C)  TempSrc: Oral Oral Oral Oral  SpO2:   94% 96%  Weight:      Height:        Intake/Output Summary (Last 24 hours) at 10/04/2019 1526  Last data filed at 10/04/2019 1102 Gross per 24 hour  Intake 10 ml  Output 300 ml  Net -290 ml   Filed Weights   10/02/19 1046  Weight: 79.4 kg   Gen: 69 y.o. female in no distress Pulm: Nonlabored breathing room air. Clear. CV: Regular rate and rhythm. No  murmur, rub, or gallop. No JVD, no dependent edema. GI: Abdomen soft, non-tender, non-distended, with normoactive bowel sounds.  Ext: Warm, no deformities Skin: No new rashes, lesions or ulcers on visualized skin. Neuro: Alert, oriented with intermittently abnormal responses. No focal neurological deficits. Psych: Judgement and insight appear fair at this time. Mood euthymic & affect congruent.   Data Reviewed: I have personally reviewed following labs and imaging studies  CBC: Recent Labs  Lab 10/02/19 1133 10/03/19 0500  WBC 7.0 5.4  NEUTROABS 5.9  --   HGB 11.0* 10.8*  HCT 35.3* 33.9*  MCV 105.1* 103.4*  PLT 168 347   Basic Metabolic Panel: Recent Labs  Lab 10/02/19 1133 10/03/19 0500 10/04/19 0517  NA 141 138 138  K 3.6 4.0 4.3  CL 107 106 106  CO2 25 25 22   GLUCOSE 117* 140* 149*  BUN 20 26* 50*  CREATININE 1.55* 1.61* 2.80*  CALCIUM 9.2 9.1 8.7*   GFR: Estimated Creatinine Clearance: 21 mL/min (A) (by C-G formula based on SCr of 2.8 mg/dL (H)). Liver Function Tests: Recent Labs  Lab 10/02/19 1133  AST 16  ALT 18  ALKPHOS 60  BILITOT 0.7  PROT 6.6  ALBUMIN 3.9   Recent Labs  Lab 10/02/19 1133  LIPASE 18   Urine analysis:    Component Value Date/Time   COLORURINE YELLOW 10/02/2019 1134   APPEARANCEUR CLEAR 10/02/2019 1134   LABSPEC 1.013 10/02/2019 1134   PHURINE 7.0 10/02/2019 1134   GLUCOSEU NEGATIVE 10/02/2019 1134   [place] 10/02/2019 1134   [place] 10/02/2019 1134   BILIRUBINUR negative 06/21/2018 1057   KETONESUR NEGATIVE 10/02/2019 1134   PROTEINUR NEGATIVE 10/02/2019 1134   UROBILINOGEN 0.2 06/21/2018 1057   NITRITE NEGATIVE 10/02/2019 1134   LEUKOCYTESUR TRACE (A) 10/02/2019 1134   Recent Results (from the past 240 hour(s))  Urine culture     Status: Abnormal   Collection Time: 10/02/19 11:34 AM   Specimen: Urine, Clean Catch  Result Value Ref Range Status   Specimen Description   Final    URINE, CLEAN  CATCH Performed at [facility], [place] [address], [place], [place] [address]    Special Requests   Final    NONE Performed at [facility], [place] [address] [place], [place] [address]    Culture (A)  Final    30,000 COLONIES/mL MULTIPLE SPECIES PRESENT, SUGGEST RECOLLECTION   Report Status 10/04/2019 FINAL  Final  SARS CORONAVIRUS 2 (TAT 6-24 HRS) Nasopharyngeal Nasopharyngeal Swab     Status: None   Collection Time: 10/03/19  7:48 AM   Specimen: Nasopharyngeal Swab  Result Value Ref Range Status   SARS Coronavirus 2 NEGATIVE NEGATIVE Final    Comment: (NOTE) SARS-CoV-2 target nucleic acids are NOT DETECTED. The SARS-CoV-2 RNA is generally detectable in upper and lower respiratory specimens during the acute phase of infection. Negative results do not preclude SARS-CoV-2 infection, do not rule out co-infections with other pathogens, and should not be used as the sole basis for treatment or other patient management decisions. Negative results must be combined with clinical observations, patient history, and epidemiological information. The expected result is Negative.  Fact Sheet for Patients: SugarRoll.be Fact Sheet for Healthcare Providers: https://www.woods-mathews.com/ This test is not yet approved or cleared by the [place] FDA and  has been authorized for detection and/or diagnosis of SARS-CoV-2 by FDA under an Emergency Use Authorization (EUA). This EUA will remain  in effect (meaning this test can be used) for the duration of the COVID-19 declaration under Section 56 4(b)(1) of the Act, 21 U.S.C. section 360bbb-3(b)(1), unless the authorization is terminated or revoked sooner. Performed at [facility], [place] [address] [place], [place] [address]       Radiology Studies: No results found.  Scheduled Meds: . Chlorhexidine Gluconate Cloth  6 each Topical Daily  . dexamethasone  (DECADRON) injection  4 mg Intravenous Q6H  . DULoxetine  30 mg Oral Daily  . feeding supplement (ENSURE ENLIVE)  237 mL Oral TID BM  . pregabalin  50 mg Oral TID  . rivaroxaban  20 mg Oral Q supper   Continuous Infusions: . sodium chloride 100 mL/hr at 10/04/19 1033     LOS: 1 day   Time spent: 35 minutes.  Patrecia Pour, MD [facility] [email or web address] 10/04/2019, 3:26 PM

## 2019-10-05 DIAGNOSIS — N1831 Chronic kidney disease, stage 3a: Secondary | ICD-10-CM

## 2019-10-05 LAB — BASIC METABOLIC PANEL WITH GFR
Anion gap: 9 (ref 5–15)
BUN: 66 mg/dL — ABNORMAL HIGH (ref 8–23)
CO2: 22 mmol/L (ref 22–32)
Calcium: 8.2 mg/dL — ABNORMAL LOW (ref 8.9–10.3)
Chloride: 107 mmol/L (ref 98–111)
Creatinine, Ser: 2.5 mg/dL — ABNORMAL HIGH (ref 0.44–1.00)
GFR calc Af Amer: 22 mL/min — ABNORMAL LOW
GFR calc non Af Amer: 19 mL/min — ABNORMAL LOW
Glucose, Bld: 138 mg/dL — ABNORMAL HIGH (ref 70–99)
Potassium: 4.3 mmol/L (ref 3.5–5.1)
Sodium: 138 mmol/L (ref 135–145)

## 2019-10-05 MED ORDER — DEXAMETHASONE 4 MG PO TABS: 4.0000 mg | ORAL_TABLET | Freq: Every day | ORAL | 0 refills | Status: DC

## 2019-10-05 MED ORDER — AMLODIPINE BESYLATE 10 MG PO TABS: 10.0000 mg | ORAL_TABLET | Freq: Every day | ORAL | Status: AC | PRN

## 2019-10-05 MED ORDER — HEPARIN SOD (PORK) LOCK FLUSH 100 UNIT/ML IV SOLN
500.0000 [IU] | INTRAVENOUS | Status: AC | PRN
  Administered 2019-10-05: 500 [IU]
  Filled 2019-10-05: qty 5

## 2019-10-05 NOTE — Discharge Summary (Signed)
Physician Discharge Summary  SHANNEN FLANSBURG QHU:[id number] DOB: 1951-05-29 DOA: 10/02/2019  PCP: Rutherford Guys, MD  Admit date: 10/02/2019 Discharge date: 10/05/2019  Admitted From: Home Disposition: Home   Recommendations for Outpatient Follow-up:  1. Follow up with PCP in 1-2 days for repeat BMP, CBC.  2. Follow up with oncology, Dr. Julien Nordmann as scheduled, and with Dr. Mickeal Skinner following tumor board discussion. 3. Monitor blood pressure. Stopped lisinopril, and changed norvasc (which the patient wasn't taking regularly) to prn for SBP >148mmHg.   Home Health: Declined Equipment/Devices: None new Discharge Condition: Stable CODE STATUS: Full Diet recommendation: Heart healthy  Brief/Interim Summary: QUINITA KOSTELECKY is a 69 y.o. female with a history of SCLC s/p chemotherapy, HTN, jugular vein thrombosis, and peripheral neuropathy who presented to the ED with several days of fatigue, malaise, 2 days of severely elevated BP despite taking her usual medications, and worsening headache with altered mental status. On arrival BP was 210/139, and she appeared to be confused. Creatinine 1.55, urinalysis unremarkable. CT head showed new multiple metastases confirmed and better defined by subsequent MRI including necrosis and vasogenic edema. Admission was requested. Decadron started, hydralazine and labetalol added to norvasc and lisinopril with precipitous decline in BP. Antihypertensives were then held with stabilization of BP. Unfortunately, renal function worsened subsequently but has begun improvement.   Discharge Diagnoses:  Principal Problem:   Hypertensive urgency Active Problems:   Small cell carcinoma of lower lobe of right lung (HCC)   Symptomatic anemia   CKD (chronic kidney disease) stage 3, GFR 30-59 ml/min   Port-A-Cath in place   Acute encephalopathy  Hypertensive emergency: Resolved. BP was corrected briskly and has not risen again. BP has normalized on lower end of  normal. No longer orthostatic. - Hold lisinopril, change norvasc to prn (which is how patient was taking this PTA anyway) - Stopped labetalol and hydralazine over the course of the previous 48 hours due to ongoing resolution of HTN.    Acute renal failure on stage IIIa CKD: With rise in creatinine and precipitous improvement in BP (delta ~465KCLE systolic) over 12 hours, there was concern for overly rapid correction possibly leading to ATN. Renal function on day of discharge showing improvement, po intake adequate, BP stable, urine output stable.  - Needs repeat labs in next 48 hours  SCLC with newly diagnosed multiple brain metastases with vasogenic edema and necrosis: Review of chart shows offer of PCI which was declined and subsequent MRI brain in 2020 showed no evidence of metastases.  - Oncology consulted, has follow up, had completed Tx with chemotherapy per Dr. Julien Nordmann. Has onc follow up currently scheduled 4/12. - Continue decadron 4mg  po daily - Follow up with Dr. Mickeal Skinner who will discuss patient at tumor board 3/22.  Right jugular vein thrombosis:  - Continue xarelto (ok per neuro-oncology)  Acute encephalopathy: Multifactorial related to HTN emergency, brain metastases. - Continue delirium precautions and close monitoring. Supportive care discussed with patient's family.  Port-a-cath in place: Noted. Thrombosis not felt to be related to this.  Peripheral neuropathy: Due to chemotherapy.  - Continue lyrica, cymbalta.  Discharge Instructions Discharge Instructions    Discharge instructions   Complete by: As directed    - Follow up with your PCP's office for repeat kidney function blood work in the next 2-3 days.  - Stop taking lisinopril and only take norvasc if your systolic (higher number) blood pressure is above 155mmHg. - Call Dr. Renda Rolls office on Tuesday or Wednesday if you  haven't heard anything from them. - Keep taking decadron 4mg  daily - If you become unable to  eat and drink, have decreased urine output, swelling, or a significant change in mental status, seek medical attention right away.   Increase activity slowly   Complete by: As directed      Allergies as of 10/05/2019   No Known Allergies     Medication List    STOP taking these medications   lisinopril 40 MG tablet Commonly known as: ZESTRIL     TAKE these medications   acetaminophen 650 MG CR tablet Commonly known as: TYLENOL Take 650 mg by mouth every 8 (eight) hours as needed for pain.   amLODipine 10 MG tablet Commonly known as: NORVASC Take 1 tablet (10 mg total) by mouth daily as needed. What changed:   when to take this  reasons to take this   atorvastatin 80 MG tablet Commonly known as: LIPITOR TAKE 1 TABLET (80 MG TOTAL) BY MOUTH DAILY AT 6 PM.   Clobetasol Prop Emollient Base 0.05 % emollient cream Commonly known as: Clobetasol Propionate E Apply 1 application topically 2 (two) times daily.   dexamethasone 4 MG tablet Commonly known as: DECADRON Take 1 tablet (4 mg total) by mouth daily.   DULoxetine 30 MG capsule Commonly known as: CYMBALTA TAKE 1 CAPSULE BY MOUTH EVERY DAY What changed: how much to take   ondansetron 4 MG tablet Commonly known as: ZOFRAN Take 1 tablet (4 mg total) by mouth every 8 (eight) hours as needed for nausea or vomiting.   pregabalin 50 MG capsule Commonly known as: LYRICA TAKE 1 CAPSULE BY MOUTH 3 TIMES A DAY   rivaroxaban 20 MG Tabs tablet Commonly known as: Xarelto Take 1 tablet (20 mg total) by mouth daily with supper.   traMADol 50 MG tablet Commonly known as: ULTRAM Take 1-2 tablets (50-100 mg total) by mouth at bedtime as needed.      Follow-up Information    Rutherford Guys, MD. Schedule an appointment as soon as possible for a visit on 10/06/2019.   Specialty: Family Medicine Why: for repeat labs Contact information: [address] [place] [address] [phone]        Ventura Sellers, MD  Follow up.   Specialties: Psychiatry, Neurology, Oncology Why: Call on Tuesday or Wednesday if you haven't heard from their office. Contact information: [place] [address] [phone]          No Known Allergies  Consultations:  Oncology  Procedures/Studies: CT Head Wo Contrast  Result Date: 10/02/2019 CLINICAL DATA:  Headaches, on Xarelto, confusion; altered mental status unclear cause. Additional history provided: History of lung cancer diagnosed 2019. EXAM: CT HEAD WITHOUT CONTRAST TECHNIQUE: Contiguous axial images were obtained from the base of the skull through the vertex without intravenous contrast. COMPARISON:  Brain MRI 01/20/2019 FINDINGS: Brain: There is an isodense mass measuring 3.9 x 3.1 x 3.2 centered along the inferior margin of the frontal horn of the right lateral ventricle. There is mild surrounding edema within the right frontal lobe. This mass near completely effaces the right frontal horn and extends slightly across midline. 2.1 cm focus of edema within the anterior left frontal lobe at the gray-white junction (for instance as seen on series 2, image 18). There is also extensive edema throughout much of the right cerebellum extending to the region of the vermis and right middle cerebellar peduncle with associated mass effect. Associated posterior fossa mass effect with  partial effacement of the fourth ventricle. There is no evidence of acute intracranial hemorrhage. No extra-axial fluid collection. Additional ill-defined hypoattenuation within the cerebral white matter is nonspecific, but consistent with chronic small vessel ischemic disease. A tiny chronic lacunar infarct within the left cerebellum was better appreciated on prior MRI. Mild generalized parenchymal atrophy. Vascular: No hyperdense vessel.  Atherosclerotic calcifications. Skull: Normal. Negative for fracture or focal lesion. Sinuses/Orbits: Visualized orbits demonstrate no acute  abnormality. Mild paranasal sinus mucosal thickening. No significant mastoid effusion. These results were called by telephone at the time of interpretation on 10/02/2019 at 12:16 pm to provider [facility] , who verbally acknowledged these results. IMPRESSION: 3.9 cm mass near completely effacing the frontal horn of the right lateral ventricle and extending slightly across midline. Mild surrounding edema within the right frontal lobe. Additional 2.1 cm focus of edema within the anterior left frontal lobe at the gray-white junction. There is also extensive edema throughout much of the right cerebellum and extending to the vermis and right middle cerebellar peduncle. Findings likely reflect multifocal intracranial metastatic disease. Contrast-enhanced brain MRI is recommended for further evaluation and to assess for additional intracranial metastases. Posterior fossa mass effect with significant effacement of the fourth ventricle. No evidence of hydrocephalus on the current exam. Background chronic small vessel ischemic disease. Electronically Signed   By: Kellie Simmering DO   On: 10/02/2019 12:17   MR Brain W and Wo Contrast  Result Date: 10/02/2019 CLINICAL DATA:  Headaches. Altered mental status. History of lung cancer. Abnormal head CT EXAM: MRI HEAD WITHOUT AND WITH CONTRAST TECHNIQUE: Multiplanar, multiecho pulse sequences of the brain and surrounding structures were obtained without and with intravenous contrast. CONTRAST:  65mL GADAVIST GADOBUTROL 1 MMOL/ML IV SOLN COMPARISON:  Head CT 10/02/2019.  MRI 01/20/2019 FINDINGS: Brain: Necrotic metastasis in the right cerebellum measuring 4.6 x 4.1 x 3.1 Cm. Vasogenic edema with mass effect. Narrowing of the fourth ventricle but no evidence complete obstruction. Solid metastasis with remarkable lack of necrosis in the right frontal region, possibly with the epicenter in the caudate head, measuring 3.6 x 3.5 x 3.4 cm. Surrounding vasogenic edema. Mass effect with  flattening of the frontal horn of the right lateral ventricle. Right-to-left midline shift at this level of 4 mm. Generalized right-to-left shift is not present. Necrotic metastasis in the left frontal lobe measuring 2.4 x 1.9 x 2.3 cm. No associated vasogenic edema. Probable nodular ependymal metastasis along the medial wall of the left lateral ventricle, axial image 31. Question second ependymal metastasis at the floor of the left lateral ventricle, axial image 27. This may simply be a vessel. Elsewhere, there are moderate chronic small-vessel ischemic changes of the cerebral hemispheric white matter. No extra-axial fluid collection. Vascular: Major vessels at the base of the brain show flow. Skull and upper cervical spine: Negative Sinuses/Orbits: Clear/normal Other: None IMPRESSION: 4.6 x 4.1 x 3.1 cm necrotic metastasis within the right cerebellum with vasogenic edema and mass effect. 3.6 x 3.5 x 3.4 cm solid metastasis in the right frontal region, possibly with the epicenter in the caudate head. Vasogenic edema and mass effect. 2.4 x 1.9 x 2.3 cm partially necrotic metastasis in the left frontal lobe without vasogenic edema. Two or 3 mm ependymal metastasis along the medial wall of the left lateral ventricle, axial image 31. Question punctate metastasis along the floor of the left lateral ventricle axial image 27. This may simply be a vessel. Electronically Signed   By: Jan Fireman.D.  On: 10/02/2019 14:14   DG Chest Portable 1 View  Result Date: 10/02/2019 CLINICAL DATA:  Weakness and shortness of breath. History of lung cancer. EXAM: PORTABLE CHEST 1 VIEW COMPARISON:  Chest CT 08/09/2019. Chest x-ray 10/29/2018 FINDINGS: 1130 hours. Left lung clear. Volume loss noted right hemithorax, as before. Right paramediastinal and hilar opacity on today's chest x-ray compatible with treatment related changes noted on recent CT scan. Consolidative opacity in the right lower lobe and small right pleural effusion  are similar. Right Port-A-Cath again noted. Telemetry leads overlie the chest. IMPRESSION: Volume loss right hemithorax with right paramediastinal and parahilar opacity consistent neoplastic and post treatment changes noted on recent chest CT. No pulmonary edema or pneumothorax. Electronically Signed   By: Misty Stanley M.D.   On: 10/02/2019 12:14     Subjective: Feels well, wants to go home. Eating, drinking, urinating regularly, getting around ok. Partner at bedside states he will be with her constantly. No syncope.  Discharge Exam: Vitals:   10/04/19 2053 10/05/19 0529  BP:  (!) 96/57  Pulse: 63 70  Resp: 19 15  Temp: 98.6 F (37 C) (!) 97.5 F (36.4 C)  SpO2: 95% 94%   General: Pt is alert, awake, not in acute distress Cardiovascular: RRR, S1/S2 +, no rubs, no gallops Respiratory: CTA bilaterally, no wheezing, no rhonchi Abdominal: Soft, NT, ND, bowel sounds + Extremities: No edema, no cyanosis  Labs: BNP (last 3 results) No results for input(s): BNP in the last 8760 hours. Basic Metabolic Panel: Recent Labs  Lab 10/02/19 1133 10/03/19 0500 10/04/19 0517 10/05/19 0510  NA 141 138 138 138  K 3.6 4.0 4.3 4.3  CL 107 106 106 107  CO2 25 25 22 22   GLUCOSE 117* 140* 149* 138*  BUN 20 26* 50* 66*  CREATININE 1.55* 1.61* 2.80* 2.50*  CALCIUM 9.2 9.1 8.7* 8.2*   Liver Function Tests: Recent Labs  Lab 10/02/19 1133  AST 16  ALT 18  ALKPHOS 60  BILITOT 0.7  PROT 6.6  ALBUMIN 3.9   Recent Labs  Lab 10/02/19 1133  LIPASE 18   No results for input(s): AMMONIA in the last 168 hours. CBC: Recent Labs  Lab 10/02/19 1133 10/03/19 0500  WBC 7.0 5.4  NEUTROABS 5.9  --   HGB 11.0* 10.8*  HCT 35.3* 33.9*  MCV 105.1* 103.4*  PLT 168 172   Cardiac Enzymes: No results for input(s): CKTOTAL, CKMB, CKMBINDEX, TROPONINI in the last 168 hours. BNP: Invalid input(s): POCBNP CBG: No results for input(s): GLUCAP in the last 168 hours. D-Dimer No results for  input(s): DDIMER in the last 72 hours. Hgb A1c No results for input(s): HGBA1C in the last 72 hours. Lipid Profile No results for input(s): CHOL, HDL, LDLCALC, TRIG, CHOLHDL, LDLDIRECT in the last 72 hours. Thyroid function studies No results for input(s): TSH, T4TOTAL, T3FREE, THYROIDAB in the last 72 hours.  Invalid input(s): FREET3 Anemia work up No results for input(s): VITAMINB12, FOLATE, FERRITIN, TIBC, IRON, RETICCTPCT in the last 72 hours. Urinalysis    Component Value Date/Time   COLORURINE YELLOW 10/02/2019 1134   APPEARANCEUR CLEAR 10/02/2019 1134   LABSPEC 1.013 10/02/2019 1134   PHURINE 7.0 10/02/2019 1134   GLUCOSEU NEGATIVE 10/02/2019 1134   HGBUR NEGATIVE 10/02/2019 [place] 10/02/2019 1134   BILIRUBINUR negative 06/21/2018 1057   [place] 10/02/2019 1134   PROTEINUR NEGATIVE 10/02/2019 1134   UROBILINOGEN 0.2 06/21/2018 1057   NITRITE NEGATIVE 10/02/2019 1134  LEUKOCYTESUR TRACE (A) 10/02/2019 1134    Microbiology Recent Results (from the past 240 hour(s))  Urine culture     Status: Abnormal   Collection Time: 10/02/19 11:34 AM   Specimen: Urine, Clean Catch  Result Value Ref Range Status   Specimen Description   Final    URINE, CLEAN CATCH Performed at [facility], [place] [address], [place], [place] [address]    Special Requests   Final    NONE Performed at [facility], [place] [address], [place], [place] [address]    Culture (A)  Final    30,000 COLONIES/mL MULTIPLE SPECIES PRESENT, SUGGEST RECOLLECTION   Report Status 10/04/2019 FINAL  Final  SARS CORONAVIRUS 2 (TAT 6-24 HRS) Nasopharyngeal Nasopharyngeal Swab     Status: None   Collection Time: 10/03/19  7:48 AM   Specimen: Nasopharyngeal Swab  Result Value Ref Range Status   SARS Coronavirus 2 NEGATIVE NEGATIVE Final    Comment: (NOTE) SARS-CoV-2 target nucleic acids are NOT DETECTED. The SARS-CoV-2 RNA is generally detectable  in upper and lower respiratory specimens during the acute phase of infection. Negative results do not preclude SARS-CoV-2 infection, do not rule out co-infections with other pathogens, and should not be used as the sole basis for treatment or other patient management decisions. Negative results must be combined with clinical observations, patient history, and epidemiological information. The expected result is Negative. Fact Sheet for Patients: SugarRoll.be Fact Sheet for Healthcare Providers: https://www.woods-mathews.com/ This test is not yet approved or cleared by the [place] FDA and  has been authorized for detection and/or diagnosis of SARS-CoV-2 by FDA under an Emergency Use Authorization (EUA). This EUA will remain  in effect (meaning this test can be used) for the duration of the COVID-19 declaration under Section 56 4(b)(1) of the Act, 21 U.S.C. section 360bbb-3(b)(1), unless the authorization is terminated or revoked sooner. Performed at [facility], [place] [address] [place], [place] [address]     Time coordinating discharge: Approximately 40 minutes  Patrecia Pour, MD  [facility] 10/05/2019, 8:51 AM

## 2019-10-05 NOTE — Progress Notes (Signed)
Nsg Discharge Note  Admit Date:  10/02/2019 Discharge date: 10/05/2019   [name] to be D/C'd Home per MD order.  AVS completed.  Copy for chart, and copy for patient signed, and dated. Patient/caregiver able to verbalize understanding.  Discharge Medication: Allergies as of 10/05/2019   No Known Allergies     Medication List    STOP taking these medications   lisinopril 40 MG tablet Commonly known as: ZESTRIL     TAKE these medications   acetaminophen 650 MG CR tablet Commonly known as: TYLENOL Take 650 mg by mouth every 8 (eight) hours as needed for pain.   amLODipine 10 MG tablet Commonly known as: NORVASC Take 1 tablet (10 mg total) by mouth daily as needed. What changed:   when to take this  reasons to take this   atorvastatin 80 MG tablet Commonly known as: LIPITOR TAKE 1 TABLET (80 MG TOTAL) BY MOUTH DAILY AT 6 PM.   Clobetasol Prop Emollient Base 0.05 % emollient cream Commonly known as: Clobetasol Propionate E Apply 1 application topically 2 (two) times daily.   dexamethasone 4 MG tablet Commonly known as: DECADRON Take 1 tablet (4 mg total) by mouth daily.   DULoxetine 30 MG capsule Commonly known as: CYMBALTA TAKE 1 CAPSULE BY MOUTH EVERY DAY What changed: how much to take   ondansetron 4 MG tablet Commonly known as: ZOFRAN Take 1 tablet (4 mg total) by mouth every 8 (eight) hours as needed for nausea or vomiting.   pregabalin 50 MG capsule Commonly known as: LYRICA TAKE 1 CAPSULE BY MOUTH 3 TIMES A DAY   rivaroxaban 20 MG Tabs tablet Commonly known as: Xarelto Take 1 tablet (20 mg total) by mouth daily with supper.   traMADol 50 MG tablet Commonly known as: ULTRAM Take 1-2 tablets (50-100 mg total) by mouth at bedtime as needed.       Discharge Assessment: Vitals:   10/04/19 2053 10/05/19 0529  BP:  (!) 96/57  Pulse: 63 70  Resp: 19 15  Temp: 98.6 F (37 C) (!) 97.5 F (36.4 C)  SpO2: 95% 94%   Skin clean, dry and  intact without evidence of skin break down, no evidence of skin tears noted. Port deaccessed by IV team. Site without signs and symptoms of complications - no redness or edema noted at insertion site, patient denies c/o pain.  D/c Instructions-Education: Discharge instructions given to patient/family with verbalized understanding. D/c education completed with patient/family including follow up instructions, medication list, d/c activities limitations if indicated, with other d/c instructions as indicated by MD - patient able to verbalize understanding, all questions fully answered. Patient instructed to return to ED, call 911, or call MD for any changes in condition.  Patient escorted via [place], and D/C home via private auto.  Eustace Pen, RN 10/05/2019 9:43 AM

## 2019-10-06 ENCOUNTER — Telehealth: Payer: Self-pay

## 2019-10-06 NOTE — Telephone Encounter (Signed)
ER advised hosp f/u of kidney function, labs are pending

## 2019-10-07 ENCOUNTER — Ambulatory Visit
Admission: RE | Admit: 2019-10-07 | Discharge: 2019-10-07 | Disposition: A | Discharge status: Home / Self Care | Payer: Medicare PPO | Source: Ambulatory Visit | Attending: Radiation Oncology | Admitting: Radiation Oncology

## 2019-10-07 ENCOUNTER — Other Ambulatory Visit: Payer: Self-pay

## 2019-10-07 ENCOUNTER — Ambulatory Visit
Admission: RE | Admit: 2019-10-07 | Discharge: 2019-10-07 | Disposition: A | Discharge status: Home / Self Care | Payer: Medicare PPO | Source: Ambulatory Visit | Admitting: Radiation Oncology

## 2019-10-07 ENCOUNTER — Telehealth (INDEPENDENT_AMBULATORY_CARE_PROVIDER_SITE_OTHER): Payer: Medicare PPO | Admitting: Family Medicine

## 2019-10-07 DIAGNOSIS — Z510 Encounter for antineoplastic radiation therapy: Secondary | ICD-10-CM | POA: Insufficient documentation

## 2019-10-07 DIAGNOSIS — N1831 Chronic kidney disease, stage 3a: Secondary | ICD-10-CM

## 2019-10-07 DIAGNOSIS — C7931 Secondary malignant neoplasm of brain: Secondary | ICD-10-CM

## 2019-10-07 DIAGNOSIS — N179 Acute kidney failure, unspecified: Secondary | ICD-10-CM

## 2019-10-07 DIAGNOSIS — D531 Other megaloblastic anemias, not elsewhere classified: Secondary | ICD-10-CM

## 2019-10-07 DIAGNOSIS — Z85118 Personal history of other malignant neoplasm of bronchus and lung: Secondary | ICD-10-CM | POA: Diagnosis not present

## 2019-10-07 DIAGNOSIS — C3431 Malignant neoplasm of lower lobe, right bronchus or lung: Secondary | ICD-10-CM | POA: Insufficient documentation

## 2019-10-07 DIAGNOSIS — Z923 Personal history of irradiation: Secondary | ICD-10-CM | POA: Diagnosis not present

## 2019-10-07 DIAGNOSIS — C781 Secondary malignant neoplasm of mediastinum: Secondary | ICD-10-CM | POA: Diagnosis not present

## 2019-10-07 DIAGNOSIS — Z7952 Long term (current) use of systemic steroids: Secondary | ICD-10-CM | POA: Diagnosis not present

## 2019-10-07 NOTE — Progress Notes (Signed)
Virtual Visit Note  I connected with patient on 10/07/19 at 510pm by video via epic and verified that I am speaking with the correct person using two identifiers. [name] is currently located at home and patient and husband is currently with them during visit. The provider, Rutherford Guys, MD is located in their office at time of visit.  I discussed the limitations, risks, security and privacy concerns of performing an evaluation and management service by telephone and the availability of in person appointments. I also discussed with the patient that there may be a patient responsible charge related to this service. The patient expressed understanding and agreed to proceed.   I provided 10 minutes of non-face-to-face time during this encounter.  Chief Complaint  Patient presents with  . Follow-up    neuropathy is doing much better, psoriasis is all gone. Released from hospital not too long ago, kidney levels were high, was told to follow up. Pt did have a fall getting out of the shower 2 wks ago, no injury    HPI   PMH: SCLC s/p chemotherapy, HTN, jugular vein thrombosis, and peripheral neuropathy ?  hosp from march 18-21 for HTN urgency, new brain mets BP treated with labelatol, hdyralazine, lisinopril and norvasc BP declined quickly and meds were stopped Stopped lisinopril, changed amlodipine to prn SBP > 140 CT head/MRI brain - new multiple mets - on decardron AKI - ? ATN Saw rad onc today - will start radiation tomorrow  She is doing ok Checks BP at home: 106/67, 92/57 HR 84, 122/74 (lying down), 110/73, 124/75, has not been taking BP meds as instructed Last chemo was June 9th 2020 She denies any more mental confusion, fever, chills, SOB, nausea or vomiting She reports good UOP  Lab Results  Component Value Date   CREATININE 2.50 (H) 10/05/2019   CREATININE 2.80 (H) 10/04/2019   CREATININE 1.61 (H) 10/03/2019   GFR 19  CBC Latest Ref Rng & Units 10/03/2019  10/02/2019 08/09/2019  WBC 4.0 - 10.5 K/uL 5.4 7.0 5.7  Hemoglobin 12.0 - 15.0 g/dL 10.8(L) 11.0(L) 9.6(L)  Hematocrit 36.0 - 46.0 % 33.9(L) 35.3(L) 30.9(L)  Platelets 150 - 400 K/uL 172 168 168      No Known Allergies  Prior to Admission medications   Medication Sig Start Date End Date Taking? Authorizing Provider  acetaminophen (TYLENOL) 650 MG CR tablet Take 650 mg by mouth every 8 (eight) hours as needed for pain.   Yes [provider]  amLODipine (NORVASC) 10 MG tablet Take 1 tablet (10 mg total) by mouth daily as needed. 10/05/19  Yes Patrecia Pour, MD  atorvastatin (LIPITOR) 80 MG tablet TAKE 1 TABLET (80 MG TOTAL) BY MOUTH DAILY AT 6 PM. 07/24/19  Yes Rutherford Guys, MD  Clobetasol Prop Emollient Base (CLOBETASOL PROPIONATE E) 0.05 % emollient cream Apply 1 application topically 2 (two) times daily. 09/04/19  Yes Rutherford Guys, MD  dexamethasone (DECADRON) 4 MG tablet Take 1 tablet (4 mg total) by mouth daily. 10/05/19  Yes Patrecia Pour, MD  DULoxetine (CYMBALTA) 30 MG capsule TAKE 1 CAPSULE BY MOUTH EVERY DAY Patient taking differently: Take 30 mg by mouth daily.  04/25/19  Yes Rutherford Guys, MD  ondansetron (ZOFRAN) 4 MG tablet Take 1 tablet (4 mg total) by mouth every 8 (eight) hours as needed for nausea or vomiting. 09/04/19  Yes Rutherford Guys, MD  pregabalin (LYRICA) 50 MG capsule TAKE 1 CAPSULE BY MOUTH  3 TIMES A DAY Patient taking differently: Take 50 mg by mouth 3 (three) times daily.  07/15/19  Yes Rutherford Guys, MD  rivaroxaban (XARELTO) 20 MG TABS tablet Take 1 tablet (20 mg total) by mouth daily with supper. 08/27/19  Yes Curt Bears, MD  traMADol (ULTRAM) 50 MG tablet Take 1-2 tablets (50-100 mg total) by mouth at bedtime as needed. 09/04/19  Yes Rutherford Guys, MD    Past Medical History:  Diagnosis Date  . Arthritis   . Chronic pain   . Hyperlipidemia   . Hypertension   . Low blood magnesium 10/04/2018  . lung ca dx'd 06/2018  . Stroke  ([place])    06/19/2018 minor    Past Surgical History:  Procedure Laterality Date  . BIOPSY  06/19/2018   Procedure: BIOPSY;  Surgeon: Laurence Spates, MD;  Location: [facility];  Service: Endoscopy;;  . BIOPSY  10/11/2018   Procedure: BIOPSY;  Surgeon: Laurence Spates, MD;  Location: [facility];  Service: Endoscopy;;  . ENDARTERECTOMY Right 06/26/2018   Procedure: ENDARTERECTOMY CAROTID RIGHT;  Surgeon: Serafina Mitchell, MD;  Location: [facility] OR;  Service: Vascular;  Laterality: Right;  . ESOPHAGOGASTRODUODENOSCOPY (EGD) WITH PROPOFOL N/A 06/19/2018   Procedure: ESOPHAGOGASTRODUODENOSCOPY (EGD) WITH PROPOFOL;  Surgeon: Laurence Spates, MD;  Location: [facility];  Service: Endoscopy;  Laterality: N/A;  . FLEXIBLE SIGMOIDOSCOPY N/A 10/11/2018   Procedure: FLEXIBLE SIGMOIDOSCOPY;  Surgeon: Laurence Spates, MD;  Location: [facility];  Service: Endoscopy;  Laterality: N/A;  . IR IMAGING GUIDED PORT INSERTION  10/07/2018  . PATCH ANGIOPLASTY Right 06/26/2018   Procedure: PATCH ANGIOPLASTY USING A XENOSURE 1cm x 6cm BIOLOGIC PATCH;  Surgeon: Serafina Mitchell, MD;  Location: [facility];  Service: Vascular;  Laterality: Right;  Marland Kitchen VIDEO BRONCHOSCOPY WITH ENDOBRONCHIAL ULTRASOUND N/A 08/14/2018   Procedure: VIDEO BRONCHOSCOPY WITH ENDOBRONCHIAL ULTRASOUND;  Surgeon: Collene Gobble, MD;  Location: [facility] OR;  Service: Thoracic;  Laterality: N/A;    Social History   Tobacco Use  . Smoking status: Former Smoker    Packs/day: 1.00    Years: 10.00    Pack years: 10.00    Types: Cigarettes    Quit date: 06/19/2018    Years since quitting: 1.3  . Smokeless tobacco: Never Used  . Tobacco comment: Currently using Nicorette Gum  Substance Use Topics  . Alcohol use: Yes    Comment: wine 2 glasses a day     Family History  Problem Relation Age of Onset  . Cancer Mother        lung cancer  . Diabetes Other   . Hyperlipidemia Other   . Hypertension Other   . Cancer Other     ROS Per hpi  Objective  Vitals  as reported by the patient: as above  GEN: AAOx3, NAD HEENT: [place]/AT, pupils are symmetrical, EOMI, non-icteric sclera Resp: breathing comfortably, speaking in full sentences Skin: no rashes noted, no pallor Psych: good eye contact, normal mood and affect   ASSESSMENT and PLAN  1. 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 Megaloblastic anemia - CBC - Folate - Vitamin B12  3. Acute renal failure superimposed on stage 3a chronic kidney disease, unspecified acute renal failure type ([place]) - Basic Metabolic Panel - labs tomorrow, if GFR < 30 and wo sign improvement will need to adjust cymbalta, lyrica and xeralto  FOLLOW-UP: 4 weeks   The above  assessment and management plan was discussed with the patient. The patient verbalized understanding of and has agreed to the management plan. Patient is aware to call the clinic if symptoms persist or worsen. Patient is aware when to return to the clinic for a follow-up visit. Patient educated on when it is appropriate to go to the emergency department.     Rutherford Guys, MD Primary Care at [facility], [place] [address] Ph.  [phone] Fax [phone]

## 2019-10-07 NOTE — Progress Notes (Signed)
Radiation Oncology         [phone] ________________________________  Name: [name] MRN: [id number]  Date: 10/07/2019  DOB: Dec 03, 1950  Re-Evaluation Note  CC: Rutherford Guys, MD  Rutherford Guys, MD    ICD-10-CM   1. Small cell carcinoma of lower lobe of right lung (HCC)  C34.31   2. Metastatic small cell carcinoma to brain [facility])  C79.31     Diagnosis: Small Cell Carcinoma of the RLLwith multifocal intracranial metastases  Narrative: The patient returns today to discuss radiation treatment options for metastasis to the brain. She was last seen in consultation on 09/18/2018 for small cell carcinoma of the right lung. She underwent radiation therapy from 09/25/2018 - 11/06/2018 (60 Gy in 30 fractions; 3D / 6X, 10X Photon).   Most recently, she was seen in the ED on 10/02/2019 with complaints of fatigue, nausea, hypertension, and headaches. CT scan of head showed a 3.9 cm mass near completely effacing the frontal horn of the right lateral ventricle and extending slightly across the midline. There was some mild surrounding edema within the right frontal lobe. There was an additional 2.1 cm focus of edema within the anterior left frontal lobe at the gray-white junction. There was also extensive edema throughout much of the right cerebellum and extending to the vermis and right middle cerebellar peduncle. Findings likely reflected multifocal intracranial metastatic disease.  MRI of brain on 10/02/2019 showed a 4.6 x 4.1 x 3.1 cm necrotic metastasis within the right cerebellum with vasogenic edema and mass effect, a 3.6 x 3.5 x 3.4 cm solid metastasis in the right frontal region, possibly with the epicenter in the caudate head with vasogenic edema and mass effect, a 2.4 x 1.9 x 2.3 cm partially necrotic metastasis in the left frontal lobe without vasogenic edema, and 2 or 3 mm ependymal metastasis along the medial wall of the left lateral ventricle. There was also a question of  punctate metastasis along the floor of the left lateral ventricle.  The patient was admitted to the hospital, treated for hypertensive urgency and acute renal failure, and was discharged on 10/05/2019.  On review of systems, the patient reports feeling much better since discharge from the hospital.  She is on Decadron 4 mg daily as recommended by neuro oncology. She denies headaches or visual problems at this time and any other symptoms.   Allergies:  has No Known Allergies.  Meds: Current Outpatient Medications  Medication Sig Dispense Refill  . acetaminophen (TYLENOL) 650 MG CR tablet Take 650 mg by mouth every 8 (eight) hours as needed for pain.    Marland Kitchen amLODipine (NORVASC) 10 MG tablet Take 1 tablet (10 mg total) by mouth daily as needed.    Marland Kitchen atorvastatin (LIPITOR) 80 MG tablet TAKE 1 TABLET (80 MG TOTAL) BY MOUTH DAILY AT 6 PM. 90 tablet 3  . Clobetasol Prop Emollient Base (CLOBETASOL PROPIONATE E) 0.05 % emollient cream Apply 1 application topically 2 (two) times daily. 60 g 3  . dexamethasone (DECADRON) 4 MG tablet Take 1 tablet (4 mg total) by mouth daily. 30 tablet 0  . DULoxetine (CYMBALTA) 30 MG capsule TAKE 1 CAPSULE BY MOUTH EVERY DAY (Patient taking differently: Take 30 mg by mouth daily. ) 90 capsule 2  . ondansetron (ZOFRAN) 4 MG tablet Take 1 tablet (4 mg total) by mouth every 8 (eight) hours as needed for nausea or vomiting. 20 tablet 3  . pregabalin (LYRICA) 50 MG capsule TAKE 1 CAPSULE BY MOUTH  3 TIMES A DAY (Patient taking differently: Take 50 mg by mouth 3 (three) times daily. ) 90 capsule 3  . rivaroxaban (XARELTO) 20 MG TABS tablet Take 1 tablet (20 mg total) by mouth daily with supper. 30 tablet 2  . traMADol (ULTRAM) 50 MG tablet Take 1-2 tablets (50-100 mg total) by mouth at bedtime as needed. 30 tablet 1   No current facility-administered medications for this encounter.    Physical Findings: The patient is in no acute distress. Patient is alert and oriented.   Accompanied by her husband.  He voices no confusion  vitals were not taken for this visit.  No significant changes. Lungs are clear to auscultation bilaterally. Heart has regular rate and rhythm. No palpable cervical, supraclavicular, or axillary adenopathy. Abdomen soft, non-tender, normal bowel sounds.  The neurological examination is nonfocal.  Lab Findings: Lab Results  Component Value Date   WBC 5.4 10/03/2019   HGB 10.8 (L) 10/03/2019   HCT 33.9 (L) 10/03/2019   MCV 103.4 (H) 10/03/2019   PLT 172 10/03/2019    Radiographic Findings: CT Head Wo Contrast  Result Date: 10/02/2019 CLINICAL DATA:  Headaches, on Xarelto, confusion; altered mental status unclear cause. Additional history provided: History of lung cancer diagnosed 2019. EXAM: CT HEAD WITHOUT CONTRAST TECHNIQUE: Contiguous axial images were obtained from the base of the skull through the vertex without intravenous contrast. COMPARISON:  Brain MRI 01/20/2019 FINDINGS: Brain: There is an isodense mass measuring 3.9 x 3.1 x 3.2 centered along the inferior margin of the frontal horn of the right lateral ventricle. There is mild surrounding edema within the right frontal lobe. This mass near completely effaces the right frontal horn and extends slightly across midline. 2.1 cm focus of edema within the anterior left frontal lobe at the gray-white junction (for instance as seen on series 2, image 18). There is also extensive edema throughout much of the right cerebellum extending to the region of the vermis and right middle cerebellar peduncle with associated mass effect. Associated posterior fossa mass effect with partial effacement of the fourth ventricle. There is no evidence of acute intracranial hemorrhage. No extra-axial fluid collection. Additional ill-defined hypoattenuation within the cerebral white matter is nonspecific, but consistent with chronic small vessel ischemic disease. A tiny chronic lacunar infarct within the left  cerebellum was better appreciated on prior MRI. Mild generalized parenchymal atrophy. Vascular: No hyperdense vessel.  Atherosclerotic calcifications. Skull: Normal. Negative for fracture or focal lesion. Sinuses/Orbits: Visualized orbits demonstrate no acute abnormality. Mild paranasal sinus mucosal thickening. No significant mastoid effusion. These results were called by telephone at the time of interpretation on 10/02/2019 at 12:16 pm to provider [facility] , who verbally acknowledged these results. IMPRESSION: 3.9 cm mass near completely effacing the frontal horn of the right lateral ventricle and extending slightly across midline. Mild surrounding edema within the right frontal lobe. Additional 2.1 cm focus of edema within the anterior left frontal lobe at the gray-white junction. There is also extensive edema throughout much of the right cerebellum and extending to the vermis and right middle cerebellar peduncle. Findings likely reflect multifocal intracranial metastatic disease. Contrast-enhanced brain MRI is recommended for further evaluation and to assess for additional intracranial metastases. Posterior fossa mass effect with significant effacement of the fourth ventricle. No evidence of hydrocephalus on the current exam. Background chronic small vessel ischemic disease. Electronically Signed   By: Kellie Simmering DO   On: 10/02/2019 12:17   MR Brain W and Wo Contrast  Result  Date: 10/02/2019 CLINICAL DATA:  Headaches. Altered mental status. History of lung cancer. Abnormal head CT EXAM: MRI HEAD WITHOUT AND WITH CONTRAST TECHNIQUE: Multiplanar, multiecho pulse sequences of the brain and surrounding structures were obtained without and with intravenous contrast. CONTRAST:  86mL GADAVIST GADOBUTROL 1 MMOL/ML IV SOLN COMPARISON:  Head CT 10/02/2019.  MRI 01/20/2019 FINDINGS: Brain: Necrotic metastasis in the right cerebellum measuring 4.6 x 4.1 x 3.1 Cm. Vasogenic edema with mass effect. Narrowing of the  fourth ventricle but no evidence complete obstruction. Solid metastasis with remarkable lack of necrosis in the right frontal region, possibly with the epicenter in the caudate head, measuring 3.6 x 3.5 x 3.4 cm. Surrounding vasogenic edema. Mass effect with flattening of the frontal horn of the right lateral ventricle. Right-to-left midline shift at this level of 4 mm. Generalized right-to-left shift is not present. Necrotic metastasis in the left frontal lobe measuring 2.4 x 1.9 x 2.3 cm. No associated vasogenic edema. Probable nodular ependymal metastasis along the medial wall of the left lateral ventricle, axial image 31. Question second ependymal metastasis at the floor of the left lateral ventricle, axial image 27. This may simply be a vessel. Elsewhere, there are moderate chronic small-vessel ischemic changes of the cerebral hemispheric white matter. No extra-axial fluid collection. Vascular: Major vessels at the base of the brain show flow. Skull and upper cervical spine: Negative Sinuses/Orbits: Clear/normal Other: None IMPRESSION: 4.6 x 4.1 x 3.1 cm necrotic metastasis within the right cerebellum with vasogenic edema and mass effect. 3.6 x 3.5 x 3.4 cm solid metastasis in the right frontal region, possibly with the epicenter in the caudate head. Vasogenic edema and mass effect. 2.4 x 1.9 x 2.3 cm partially necrotic metastasis in the left frontal lobe without vasogenic edema. Two or 3 mm ependymal metastasis along the medial wall of the left lateral ventricle, axial image 31. Question punctate metastasis along the floor of the left lateral ventricle axial image 27. This may simply be a vessel. Electronically Signed   By: Nelson Chimes M.D.   On: 10/02/2019 14:14   DG Chest Portable 1 View  Result Date: 10/02/2019 CLINICAL DATA:  Weakness and shortness of breath. History of lung cancer. EXAM: PORTABLE CHEST 1 VIEW COMPARISON:  Chest CT 08/09/2019. Chest x-ray 10/29/2018 FINDINGS: 1130 hours. Left lung  clear. Volume loss noted right hemithorax, as before. Right paramediastinal and hilar opacity on today's chest x-ray compatible with treatment related changes noted on recent CT scan. Consolidative opacity in the right lower lobe and small right pleural effusion are similar. Right Port-A-Cath again noted. Telemetry leads overlie the chest. IMPRESSION: Volume loss right hemithorax with right paramediastinal and parahilar opacity consistent neoplastic and post treatment changes noted on recent chest CT. No pulmonary edema or pneumothorax. Electronically Signed   By: Misty Stanley M.D.   On: 10/02/2019 12:14    Impression: Small Cell Carcinoma of the RLLwith multifocal intracranial metastases  Patient will be a good candidate for whole brain radiation therapy.  The case was presented at the multidisciplinary neuro-oncology conference yesterday and there were no recommendations for surgery.  I discussed the overall course of treatment side effects and potential toxicities of radiation therapy in this situation with patient and her husband.  She appears to understand and wishes to proceed with planned course of treatment.  Plan:  Patient is scheduled for CT simulation later today.  She will begin her radiation treatments tomorrow and receive a total of 10 fractions.  -----------------------------------  Nelda Severe.  Sondra Come, PhD, MD  This document serves as a record of services personally performed by Gery Pray, MD. It was created on his behalf by Clerance Lav, a trained medical scribe. The creation of this record is based on the scribe's personal observations and the provider's statements to them. This document has been checked and approved by the attending provider.

## 2019-10-07 NOTE — Progress Notes (Signed)
  Radiation Oncology         [phone] ________________________________  Name: [name] MRN: [id number]  Date: 10/07/2019  DOB: 08/29/1950  SIMULATION AND TREATMENT PLANNING NOTE    ICD-10-CM   1. 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  Identity was confirmed.  All relevant records and images related to the planned course of therapy were reviewed.  The patient freely provided informed written consent to proceed with treatment after reviewing the details related to the planned course of therapy. The consent form was witnessed and verified by the simulation staff.  Then, the patient was set-up in a stable reproducible  supine position for radiation therapy.  CT images were obtained.  Surface markings were placed.  The CT images were loaded into the planning software.  Then the target and avoidance structures were contoured.  Treatment planning then occurred.  The radiation prescription was entered and confirmed.  Then, I designed and supervised the construction of a total of 5 medically necessary complex treatment devices.  I have requested : Isodose Plan.  I have ordered:dose calc.  PLAN:  The patient will receive 30 Gy in 10 fractions directed at the whole brain.  Treatments to begin tomorrow.  Patient will remain on Decadron 4 mg daily with eventual taper.  -----------------------------------  Blair Promise, PhD, MD  This document serves as a record of services personally performed by Gery Pray, MD. It was created on his behalf by Clerance Lav, a trained medical scribe. The creation of this record is based on the scribe's personal observations and the provider's statements to them. This document has been checked and approved by the attending provider.

## 2019-10-07 NOTE — Progress Notes (Signed)
  Radiation Oncology         ([phone] ________________________________  Name: [name] MRN: [id number]  Date: 10/08/2019  DOB: 1951/04/30  Simulation Verification Note    ICD-10-CM   1. Small cell carcinoma of lower lobe of right lung (HCC)  C34.31   2. Metastatic small cell carcinoma to brain [facility])  C79.31     NARRATIVE: The patient was brought to the treatment unit and placed in the planned treatment position. The clinical setup was verified. Then port films were obtained and uploaded to the radiation oncology medical record software.  The treatment beams were carefully compared against the planned radiation fields. The position location and shape of the radiation fields was reviewed. They targeted volume of tissue appears to be appropriately covered by the radiation beams. Organs at risk appear to be excluded as planned.  Based on my personal review, I approved the simulation verification. The patient's treatment will proceed as planned.  -----------------------------------  Blair Promise, PhD, MD  This document serves as a record of services personally performed by Gery Pray, MD. It was created on his behalf by Clerance Lav, a trained medical scribe. The creation of this record is based on the scribe's personal observations and the provider's statements to them. This document has been checked and approved by the attending provider.

## 2019-10-08 ENCOUNTER — Other Ambulatory Visit: Payer: Self-pay

## 2019-10-08 ENCOUNTER — Ambulatory Visit: Payer: Medicare PPO

## 2019-10-08 ENCOUNTER — Ambulatory Visit
Admission: RE | Admit: 2019-10-08 | Discharge: 2019-10-08 | Disposition: A | Discharge status: Home / Self Care | Payer: Medicare PPO | Source: Ambulatory Visit | Attending: Radiation Oncology | Admitting: Radiation Oncology

## 2019-10-08 DIAGNOSIS — C3431 Malignant neoplasm of lower lobe, right bronchus or lung: Secondary | ICD-10-CM

## 2019-10-08 DIAGNOSIS — C7931 Secondary malignant neoplasm of brain: Secondary | ICD-10-CM

## 2019-10-08 DIAGNOSIS — E7800 Pure hypercholesterolemia, unspecified: Secondary | ICD-10-CM

## 2019-10-08 DIAGNOSIS — C781 Secondary malignant neoplasm of mediastinum: Secondary | ICD-10-CM | POA: Diagnosis not present

## 2019-10-08 DIAGNOSIS — Z510 Encounter for antineoplastic radiation therapy: Secondary | ICD-10-CM | POA: Diagnosis not present

## 2019-10-09 ENCOUNTER — Other Ambulatory Visit: Payer: Self-pay

## 2019-10-09 ENCOUNTER — Ambulatory Visit
Admission: RE | Admit: 2019-10-09 | Discharge: 2019-10-09 | Disposition: A | Discharge status: Home / Self Care | Payer: Medicare PPO | Source: Ambulatory Visit | Attending: Radiation Oncology | Admitting: Radiation Oncology

## 2019-10-09 DIAGNOSIS — C781 Secondary malignant neoplasm of mediastinum: Secondary | ICD-10-CM | POA: Diagnosis not present

## 2019-10-09 DIAGNOSIS — Z510 Encounter for antineoplastic radiation therapy: Secondary | ICD-10-CM | POA: Diagnosis not present

## 2019-10-09 DIAGNOSIS — C3431 Malignant neoplasm of lower lobe, right bronchus or lung: Secondary | ICD-10-CM | POA: Diagnosis not present

## 2019-10-09 DIAGNOSIS — C7931 Secondary malignant neoplasm of brain: Secondary | ICD-10-CM | POA: Diagnosis not present

## 2019-10-09 LAB — LIPID PANEL
Chol/HDL Ratio: 3.3 ratio (ref 0.0–4.4)
Cholesterol, Total: 156 mg/dL (ref 100–199)
HDL: 48 mg/dL (ref >39)
LDL Chol Calc (NIH): 79 mg/dL (ref 0–99)
Triglycerides: 172 mg/dL — ABNORMAL HIGH (ref 0–149)
VLDL Cholesterol Cal: 29 mg/dL (ref 5–40)

## 2019-10-09 LAB — COMPREHENSIVE METABOLIC PANEL
ALT: 71 IU/L — ABNORMAL HIGH (ref 0–32)
AST: 31 IU/L (ref 0–40)
Albumin/Globulin Ratio: 1.7 (ref 1.2–2.2)
Albumin: 3.7 g/dL — ABNORMAL LOW (ref 3.8–4.8)
Alkaline Phosphatase: 59 IU/L (ref 39–117)
BUN/Creatinine Ratio: 19 (ref 12–28)
BUN: 30 mg/dL — ABNORMAL HIGH (ref 8–27)
Bilirubin Total: 0.2 mg/dL (ref 0.0–1.2)
CO2: 21 mmol/L (ref 20–29)
Calcium: 8.8 mg/dL (ref 8.7–10.3)
Chloride: 108 mmol/L — ABNORMAL HIGH (ref 96–106)
Creatinine, Ser: 1.54 mg/dL — ABNORMAL HIGH (ref 0.57–1.00)
GFR calc Af Amer: 40 mL/min/{1.73_m2} — ABNORMAL LOW (ref >59)
GFR calc non Af Amer: 35 mL/min/{1.73_m2} — ABNORMAL LOW (ref >59)
Globulin, Total: 2.2 g/dL (ref 1.5–4.5)
Glucose: 118 mg/dL — ABNORMAL HIGH (ref 65–99)
Potassium: 4.3 mmol/L (ref 3.5–5.2)
Sodium: 144 mmol/L (ref 134–144)
Total Protein: 5.9 g/dL — ABNORMAL LOW (ref 6.0–8.5)

## 2019-10-10 ENCOUNTER — Ambulatory Visit
Admission: RE | Admit: 2019-10-10 | Discharge: 2019-10-10 | Disposition: A | Discharge status: Home / Self Care | Payer: Medicare PPO | Source: Ambulatory Visit | Attending: Radiation Oncology | Admitting: Radiation Oncology

## 2019-10-10 ENCOUNTER — Other Ambulatory Visit: Payer: Self-pay

## 2019-10-10 DIAGNOSIS — Z510 Encounter for antineoplastic radiation therapy: Secondary | ICD-10-CM | POA: Diagnosis not present

## 2019-10-10 DIAGNOSIS — C781 Secondary malignant neoplasm of mediastinum: Secondary | ICD-10-CM | POA: Diagnosis not present

## 2019-10-10 DIAGNOSIS — C3431 Malignant neoplasm of lower lobe, right bronchus or lung: Secondary | ICD-10-CM | POA: Diagnosis not present

## 2019-10-10 DIAGNOSIS — C7931 Secondary malignant neoplasm of brain: Secondary | ICD-10-CM | POA: Diagnosis not present

## 2019-10-13 ENCOUNTER — Ambulatory Visit
Admission: RE | Admit: 2019-10-13 | Discharge: 2019-10-13 | Disposition: A | Discharge status: Home / Self Care | Payer: Medicare PPO | Source: Ambulatory Visit | Attending: Radiation Oncology | Admitting: Radiation Oncology

## 2019-10-13 ENCOUNTER — Ambulatory Visit: Payer: Medicare PPO | Admitting: Family Medicine

## 2019-10-13 ENCOUNTER — Other Ambulatory Visit: Payer: Self-pay

## 2019-10-13 ENCOUNTER — Encounter: Payer: Self-pay | Admitting: Family Medicine

## 2019-10-13 VITALS — BP 91/60 | HR 81 | Temp 98.7°F | Ht 66.0 in | Wt 175.0 lb

## 2019-10-13 DIAGNOSIS — N179 Acute kidney failure, unspecified: Secondary | ICD-10-CM | POA: Diagnosis not present

## 2019-10-13 DIAGNOSIS — C7931 Secondary malignant neoplasm of brain: Secondary | ICD-10-CM | POA: Diagnosis not present

## 2019-10-13 DIAGNOSIS — C781 Secondary malignant neoplasm of mediastinum: Secondary | ICD-10-CM | POA: Diagnosis not present

## 2019-10-13 DIAGNOSIS — D531 Other megaloblastic anemias, not elsewhere classified: Secondary | ICD-10-CM | POA: Diagnosis not present

## 2019-10-13 DIAGNOSIS — N1831 Chronic kidney disease, stage 3a: Secondary | ICD-10-CM

## 2019-10-13 DIAGNOSIS — I959 Hypotension, unspecified: Secondary | ICD-10-CM | POA: Diagnosis not present

## 2019-10-13 DIAGNOSIS — C3431 Malignant neoplasm of lower lobe, right bronchus or lung: Secondary | ICD-10-CM | POA: Diagnosis not present

## 2019-10-13 DIAGNOSIS — Z510 Encounter for antineoplastic radiation therapy: Secondary | ICD-10-CM | POA: Diagnosis not present

## 2019-10-13 NOTE — Patient Instructions (Signed)
° ° ° °  If you have lab work done today you will be contacted with your lab results within the next 2 weeks.  If you have not heard from us then please contact us. The fastest way to get your results is to register for My Chart. ° ° °IF you received an x-ray today, you will receive an invoice from [facility]. Please contact [facility] at [phone] with questions or concerns regarding your invoice.  ° °IF you received labwork today, you will receive an invoice from [facility]. Please contact [facility] at [phone] with questions or concerns regarding your invoice.  ° °Our billing staff will not be able to assist you with questions regarding bills from these companies. ° °You will be contacted with the lab results as soon as they are available. The fastest way to get your results is to activate your My Chart account. Instructions are located on the last page of this paperwork. If you have not heard from us regarding the results in 2 weeks, please contact this office. °  ° ° ° °

## 2019-10-13 NOTE — Progress Notes (Signed)
3/29/20213:36 PM  [name] August 01, 1950, 69 y.o., female [id number]  Chief Complaint  Patient presents with  . Follow-up    discussion of lab taken last wk for kidney function    HPI:   Patient is a 69 y.o. female with past medical history significant for HTN, CKD3, CVA in 06/2018, HLP, metastatic lung cancer to brain, UGIB with h/o transfusion, psoriasis,who presents today forroutine followup  She is overall doing well She has 3 more brain radiation left then will have repeat MRI and followup wit rad and onc She is not taking any BP meds as her bp has been running lowish at home She is slowly starting to feel better, eat more She is spending most of her day in bed She has no acute concerns today   Lab Results  Component Value Date   CREATININE 1.54 (H) 10/08/2019   CREATININE 2.50 (H) 10/05/2019   CREATININE 2.80 (H) 10/04/2019    Depression screen [facility] 2/9 10/07/2019 09/04/2019 06/05/2019  Decreased Interest 0 0 0  Down, Depressed, Hopeless 0 0 0  PHQ - 2 Score 0 0 0  Some recent data might be hidden    Fall Risk  10/13/2019 10/07/2019 09/04/2019 06/05/2019 04/03/2019  Falls in the past year? 0 1 0 0 0  Number falls in past yr: 0 0 0 0 0  Injury with Fall? 0 0 0 0 0  Risk for fall due to : Impaired balance/gait;Impaired mobility - - - -     No Known Allergies  Prior to Admission medications   Medication Sig Start Date End Date Taking? Authorizing Provider  acetaminophen (TYLENOL) 650 MG CR tablet Take 650 mg by mouth every 8 (eight) hours as needed for pain.   Yes [provider]  amLODipine (NORVASC) 10 MG tablet Take 1 tablet (10 mg total) by mouth daily as needed. 10/05/19  Yes Patrecia Pour, MD  atorvastatin (LIPITOR) 80 MG tablet TAKE 1 TABLET (80 MG TOTAL) BY MOUTH DAILY AT 6 PM. 07/24/19  Yes Rutherford Guys, MD  Clobetasol Prop Emollient Base (CLOBETASOL PROPIONATE E) 0.05 % emollient cream Apply 1 application topically 2 (two) times daily.  09/04/19  Yes Rutherford Guys, MD  dexamethasone (DECADRON) 4 MG tablet Take 1 tablet (4 mg total) by mouth daily. 10/05/19  Yes Patrecia Pour, MD  DULoxetine (CYMBALTA) 30 MG capsule TAKE 1 CAPSULE BY MOUTH EVERY DAY Patient taking differently: Take 30 mg by mouth daily.  04/25/19  Yes Rutherford Guys, MD  ondansetron (ZOFRAN) 4 MG tablet Take 1 tablet (4 mg total) by mouth every 8 (eight) hours as needed for nausea or vomiting. 09/04/19  Yes Rutherford Guys, MD  pregabalin (LYRICA) 50 MG capsule TAKE 1 CAPSULE BY MOUTH 3 TIMES A DAY Patient taking differently: Take 50 mg by mouth 3 (three) times daily.  07/15/19  Yes Rutherford Guys, MD  rivaroxaban (XARELTO) 20 MG TABS tablet Take 1 tablet (20 mg total) by mouth daily with supper. 08/27/19  Yes Curt Bears, MD  traMADol (ULTRAM) 50 MG tablet Take 1-2 tablets (50-100 mg total) by mouth at bedtime as needed. 09/04/19  Yes Rutherford Guys, MD    Past Medical History:  Diagnosis Date  . Arthritis   . Chronic pain   . Hyperlipidemia   . Hypertension   . Low blood magnesium 10/04/2018  . lung ca dx'd 06/2018  . Stroke ([place])    06/19/2018 minor    Past  Surgical History:  Procedure Laterality Date  . BIOPSY  06/19/2018   Procedure: BIOPSY;  Surgeon: Laurence Spates, MD;  Location: [facility];  Service: Endoscopy;;  . BIOPSY  10/11/2018   Procedure: BIOPSY;  Surgeon: Laurence Spates, MD;  Location: [facility];  Service: Endoscopy;;  . ENDARTERECTOMY Right 06/26/2018   Procedure: ENDARTERECTOMY CAROTID RIGHT;  Surgeon: Serafina Mitchell, MD;  Location[facility] OR;  Service: Vascular;  Laterality: Right;  . ESOPHAGOGASTRODUODENOSCOPY (EGD) WITH PROPOFOL N/A 06/19/2018   Procedure: ESOPHAGOGASTRODUODENOSCOPY (EGD) WITH PROPOFOL;  Surgeon: Laurence Spates, MD;  Location: [facility];  Service: Endoscopy;  Laterality: N/A;  . FLEXIBLE SIGMOIDOSCOPY N/A 10/11/2018   Procedure: FLEXIBLE SIGMOIDOSCOPY;  Surgeon: Laurence Spates, MD;  Location: [facility];  Service: Endoscopy;  Laterality: N/A;  . IR IMAGING GUIDED PORT INSERTION  10/07/2018  . PATCH ANGIOPLASTY Right 06/26/2018   Procedure: PATCH ANGIOPLASTY USING A XENOSURE 1cm x 6cm BIOLOGIC PATCH;  Surgeon: Serafina Mitchell, MD;  Location: [place];  Service: Vascular;  Laterality: Right;  Marland Kitchen VIDEO BRONCHOSCOPY WITH ENDOBRONCHIAL ULTRASOUND N/A 08/14/2018   Procedure: VIDEO BRONCHOSCOPY WITH ENDOBRONCHIAL ULTRASOUND;  Surgeon: Collene Gobble, MD;  Location: [facility] OR;  Service: Thoracic;  Laterality: N/A;    Social History   Tobacco Use  . Smoking status: Former Smoker    Packs/day: 1.00    Years: 10.00    Pack years: 10.00    Types: Cigarettes    Quit date: 06/19/2018    Years since quitting: 1.3  . Smokeless tobacco: Never Used  . Tobacco comment: Currently using Nicorette Gum  Substance Use Topics  . Alcohol use: Yes    Comment: wine 2 glasses a day     Family History  Problem Relation Age of Onset  . Cancer Mother        lung cancer  . Diabetes Other   . Hyperlipidemia Other   . Hypertension Other   . Cancer Other     Review of Systems  Constitutional: Positive for malaise/fatigue. Negative for chills and fever.  Respiratory: Negative for cough and shortness of breath.   Cardiovascular: Negative for chest pain, palpitations and leg swelling.  Gastrointestinal: Negative for abdominal pain, nausea and vomiting.  Neurological: Negative for dizziness.     OBJECTIVE:  Today's Vitals   10/13/19 1507 10/13/19 1508  BP: 91/60 91/60  Pulse: 81 81  Temp: 98.3 F (36.8 C) 98.7 F (37.1 C)  SpO2:  98%  Weight:  175 lb (79.4 kg)  Height:  5\' 6"  (1.676 m)   Body mass index is 28.25 kg/m.   Physical Exam Vitals and nursing note reviewed.  Constitutional:      Appearance: She is well-developed.  HENT:     Head: Normocephalic and atraumatic.     Mouth/Throat:     Pharynx: No oropharyngeal exudate.  Eyes:     General: No scleral icterus.     Conjunctiva/sclera: Conjunctivae normal.     Pupils: Pupils are equal, round, and reactive to light.  Cardiovascular:     Rate and Rhythm: Normal rate and regular rhythm.     Heart sounds: Normal heart sounds. No murmur. No friction rub. No gallop.   Pulmonary:     Effort: Pulmonary effort is normal.     Breath sounds: Normal breath sounds. No wheezing or rales.  Musculoskeletal:     Cervical back: Neck supple.     Right lower leg: No edema.     Left lower leg: No edema.  Skin:    General: Skin is warm and dry.  Neurological:     Mental Status: She is alert and oriented to person, place, and time.     No results found for this or any previous visit (from the past 24 hour(s)).  No results found.   ASSESSMENT and PLAN  1. Metastatic small cell carcinoma to brain [facility]) Currently undergoing radiation. Under onc care. Overall in good spirits.   2. Acute renal failure superimposed on stage 3a chronic kidney disease, unspecified acute renal failure type ([place]) crt coming back doen to baseline.   3. Hypotension, unspecified hypotension type Cont pushing fluids, fall precautions discussed  4. Megaloblastic anemia Labs pending  Return for as scheduled.    Rutherford Guys, MD [facility] [place], [place] [address] Ph.  [phone] Fax [phone]

## 2019-10-14 ENCOUNTER — Other Ambulatory Visit: Payer: Self-pay

## 2019-10-14 ENCOUNTER — Ambulatory Visit
Admission: RE | Admit: 2019-10-14 | Discharge: 2019-10-14 | Disposition: A | Discharge status: Home / Self Care | Payer: Medicare PPO | Source: Ambulatory Visit | Attending: Radiation Oncology | Admitting: Radiation Oncology

## 2019-10-14 ENCOUNTER — Encounter: Payer: Self-pay | Admitting: Family Medicine

## 2019-10-14 ENCOUNTER — Telehealth: Payer: Medicare PPO | Admitting: Family Medicine

## 2019-10-14 DIAGNOSIS — C781 Secondary malignant neoplasm of mediastinum: Secondary | ICD-10-CM | POA: Diagnosis not present

## 2019-10-14 DIAGNOSIS — C3431 Malignant neoplasm of lower lobe, right bronchus or lung: Secondary | ICD-10-CM | POA: Diagnosis not present

## 2019-10-14 DIAGNOSIS — Z510 Encounter for antineoplastic radiation therapy: Secondary | ICD-10-CM | POA: Diagnosis not present

## 2019-10-14 DIAGNOSIS — C7931 Secondary malignant neoplasm of brain: Secondary | ICD-10-CM | POA: Diagnosis not present

## 2019-10-14 LAB — CBC
Hematocrit: 32.7 % — ABNORMAL LOW (ref 34.0–46.6)
Hemoglobin: 10.9 g/dL — ABNORMAL LOW (ref 11.1–15.9)
MCH: 33.2 pg — ABNORMAL HIGH (ref 26.6–33.0)
MCHC: 33.3 g/dL (ref 31.5–35.7)
MCV: 100 fL — ABNORMAL HIGH (ref 79–97)
Platelets: 157 10*3/uL (ref 150–450)
RBC: 3.28 x10E6/uL — ABNORMAL LOW (ref 3.77–5.28)
RDW: 12.4 % (ref 11.7–15.4)
WBC: 7.1 10*3/uL (ref 3.4–10.8)

## 2019-10-14 LAB — BASIC METABOLIC PANEL
BUN/Creatinine Ratio: 17 (ref 12–28)
BUN: 26 mg/dL (ref 8–27)
CO2: 21 mmol/L (ref 20–29)
Calcium: 8.5 mg/dL — ABNORMAL LOW (ref 8.7–10.3)
Chloride: 107 mmol/L — ABNORMAL HIGH (ref 96–106)
Creatinine, Ser: 1.53 mg/dL — ABNORMAL HIGH (ref 0.57–1.00)
GFR calc Af Amer: 41 mL/min/{1.73_m2} — ABNORMAL LOW (ref >59)
GFR calc non Af Amer: 35 mL/min/{1.73_m2} — ABNORMAL LOW (ref >59)
Glucose: 117 mg/dL — ABNORMAL HIGH (ref 65–99)
Potassium: 4.2 mmol/L (ref 3.5–5.2)
Sodium: 141 mmol/L (ref 134–144)

## 2019-10-14 LAB — VITAMIN B12: Vitamin B-12: 456 pg/mL (ref 232–1245)

## 2019-10-14 LAB — FOLATE: Folate: 8.3 ng/mL (ref >3.0)

## 2019-10-15 ENCOUNTER — Ambulatory Visit
Admission: RE | Admit: 2019-10-15 | Discharge: 2019-10-15 | Disposition: A | Discharge status: Home / Self Care | Payer: Medicare PPO | Source: Ambulatory Visit | Attending: Radiation Oncology | Admitting: Radiation Oncology

## 2019-10-15 ENCOUNTER — Other Ambulatory Visit: Payer: Self-pay

## 2019-10-15 DIAGNOSIS — C781 Secondary malignant neoplasm of mediastinum: Secondary | ICD-10-CM | POA: Diagnosis not present

## 2019-10-15 DIAGNOSIS — C3431 Malignant neoplasm of lower lobe, right bronchus or lung: Secondary | ICD-10-CM | POA: Diagnosis not present

## 2019-10-15 DIAGNOSIS — Z510 Encounter for antineoplastic radiation therapy: Secondary | ICD-10-CM | POA: Diagnosis not present

## 2019-10-15 DIAGNOSIS — C7931 Secondary malignant neoplasm of brain: Secondary | ICD-10-CM | POA: Diagnosis not present

## 2019-10-16 ENCOUNTER — Other Ambulatory Visit: Payer: Self-pay

## 2019-10-16 ENCOUNTER — Ambulatory Visit
Admission: RE | Admit: 2019-10-16 | Discharge: 2019-10-16 | Disposition: A | Discharge status: Home / Self Care | Payer: Medicare PPO | Source: Ambulatory Visit | Attending: Radiation Oncology | Admitting: Radiation Oncology

## 2019-10-16 DIAGNOSIS — C3431 Malignant neoplasm of lower lobe, right bronchus or lung: Secondary | ICD-10-CM | POA: Diagnosis not present

## 2019-10-16 DIAGNOSIS — C7931 Secondary malignant neoplasm of brain: Secondary | ICD-10-CM | POA: Insufficient documentation

## 2019-10-16 DIAGNOSIS — Z510 Encounter for antineoplastic radiation therapy: Secondary | ICD-10-CM | POA: Insufficient documentation

## 2019-10-16 DIAGNOSIS — C781 Secondary malignant neoplasm of mediastinum: Secondary | ICD-10-CM | POA: Diagnosis not present

## 2019-10-17 ENCOUNTER — Other Ambulatory Visit: Payer: Self-pay

## 2019-10-17 ENCOUNTER — Ambulatory Visit
Admission: RE | Admit: 2019-10-17 | Discharge: 2019-10-17 | Disposition: A | Discharge status: Home / Self Care | Payer: Medicare PPO | Source: Ambulatory Visit | Attending: Radiation Oncology | Admitting: Radiation Oncology

## 2019-10-17 DIAGNOSIS — C781 Secondary malignant neoplasm of mediastinum: Secondary | ICD-10-CM | POA: Diagnosis not present

## 2019-10-17 DIAGNOSIS — C7931 Secondary malignant neoplasm of brain: Secondary | ICD-10-CM | POA: Diagnosis not present

## 2019-10-17 DIAGNOSIS — Z510 Encounter for antineoplastic radiation therapy: Secondary | ICD-10-CM | POA: Diagnosis not present

## 2019-10-17 DIAGNOSIS — C3431 Malignant neoplasm of lower lobe, right bronchus or lung: Secondary | ICD-10-CM | POA: Diagnosis not present

## 2019-10-20 ENCOUNTER — Other Ambulatory Visit: Payer: Self-pay

## 2019-10-20 ENCOUNTER — Ambulatory Visit
Admission: RE | Admit: 2019-10-20 | Discharge: 2019-10-20 | Disposition: A | Discharge status: Home / Self Care | Payer: Medicare PPO | Source: Ambulatory Visit | Attending: Radiation Oncology | Admitting: Radiation Oncology

## 2019-10-20 DIAGNOSIS — C7931 Secondary malignant neoplasm of brain: Secondary | ICD-10-CM | POA: Diagnosis not present

## 2019-10-20 DIAGNOSIS — C3431 Malignant neoplasm of lower lobe, right bronchus or lung: Secondary | ICD-10-CM | POA: Diagnosis not present

## 2019-10-20 DIAGNOSIS — C781 Secondary malignant neoplasm of mediastinum: Secondary | ICD-10-CM | POA: Diagnosis not present

## 2019-10-20 DIAGNOSIS — Z510 Encounter for antineoplastic radiation therapy: Secondary | ICD-10-CM | POA: Diagnosis not present

## 2019-10-21 ENCOUNTER — Ambulatory Visit
Admission: RE | Admit: 2019-10-21 | Discharge: 2019-10-21 | Disposition: A | Discharge status: Home / Self Care | Payer: Medicare PPO | Source: Ambulatory Visit | Attending: Radiation Oncology | Admitting: Radiation Oncology

## 2019-10-21 ENCOUNTER — Other Ambulatory Visit: Payer: Self-pay

## 2019-10-21 ENCOUNTER — Encounter: Payer: Self-pay | Admitting: Radiation Oncology

## 2019-10-21 DIAGNOSIS — C3431 Malignant neoplasm of lower lobe, right bronchus or lung: Secondary | ICD-10-CM | POA: Diagnosis not present

## 2019-10-21 DIAGNOSIS — Z510 Encounter for antineoplastic radiation therapy: Secondary | ICD-10-CM | POA: Diagnosis not present

## 2019-10-21 DIAGNOSIS — C7931 Secondary malignant neoplasm of brain: Secondary | ICD-10-CM | POA: Diagnosis not present

## 2019-10-21 DIAGNOSIS — C781 Secondary malignant neoplasm of mediastinum: Secondary | ICD-10-CM | POA: Diagnosis not present

## 2019-10-24 ENCOUNTER — Inpatient Hospital Stay: Payer: Medicare PPO

## 2019-10-24 ENCOUNTER — Inpatient Hospital Stay: Payer: Medicare PPO | Attending: Internal Medicine

## 2019-10-24 ENCOUNTER — Other Ambulatory Visit: Payer: Medicare PPO

## 2019-10-24 ENCOUNTER — Other Ambulatory Visit: Payer: Self-pay

## 2019-10-24 ENCOUNTER — Ambulatory Visit (HOSPITAL_COMMUNITY)
Admission: RE | Admit: 2019-10-24 | Discharge: 2019-10-24 | Disposition: A | Discharge status: Home / Self Care | Payer: Medicare PPO | Source: Ambulatory Visit | Attending: Internal Medicine | Admitting: Internal Medicine

## 2019-10-24 DIAGNOSIS — Z86718 Personal history of other venous thrombosis and embolism: Secondary | ICD-10-CM | POA: Insufficient documentation

## 2019-10-24 DIAGNOSIS — C7951 Secondary malignant neoplasm of bone: Secondary | ICD-10-CM | POA: Diagnosis not present

## 2019-10-24 DIAGNOSIS — R197 Diarrhea, unspecified: Secondary | ICD-10-CM | POA: Diagnosis not present

## 2019-10-24 DIAGNOSIS — Z5111 Encounter for antineoplastic chemotherapy: Secondary | ICD-10-CM | POA: Diagnosis not present

## 2019-10-24 DIAGNOSIS — R5383 Other fatigue: Secondary | ICD-10-CM | POA: Diagnosis not present

## 2019-10-24 DIAGNOSIS — C7931 Secondary malignant neoplasm of brain: Secondary | ICD-10-CM | POA: Diagnosis not present

## 2019-10-24 DIAGNOSIS — Z7689 Persons encountering health services in other specified circumstances: Secondary | ICD-10-CM | POA: Insufficient documentation

## 2019-10-24 DIAGNOSIS — R634 Abnormal weight loss: Secondary | ICD-10-CM | POA: Diagnosis not present

## 2019-10-24 DIAGNOSIS — M1990 Unspecified osteoarthritis, unspecified site: Secondary | ICD-10-CM | POA: Diagnosis not present

## 2019-10-24 DIAGNOSIS — C3431 Malignant neoplasm of lower lobe, right bronchus or lung: Secondary | ICD-10-CM | POA: Diagnosis not present

## 2019-10-24 DIAGNOSIS — Z79899 Other long term (current) drug therapy: Secondary | ICD-10-CM | POA: Diagnosis not present

## 2019-10-24 DIAGNOSIS — Z5112 Encounter for antineoplastic immunotherapy: Secondary | ICD-10-CM | POA: Diagnosis not present

## 2019-10-24 DIAGNOSIS — I10 Essential (primary) hypertension: Secondary | ICD-10-CM | POA: Insufficient documentation

## 2019-10-24 DIAGNOSIS — C3491 Malignant neoplasm of unspecified part of right bronchus or lung: Secondary | ICD-10-CM | POA: Diagnosis not present

## 2019-10-24 DIAGNOSIS — Z7901 Long term (current) use of anticoagulants: Secondary | ICD-10-CM | POA: Diagnosis not present

## 2019-10-24 DIAGNOSIS — Z7952 Long term (current) use of systemic steroids: Secondary | ICD-10-CM | POA: Diagnosis not present

## 2019-10-24 DIAGNOSIS — E785 Hyperlipidemia, unspecified: Secondary | ICD-10-CM | POA: Diagnosis not present

## 2019-10-24 DIAGNOSIS — Z8673 Personal history of transient ischemic attack (TIA), and cerebral infarction without residual deficits: Secondary | ICD-10-CM | POA: Diagnosis not present

## 2019-10-24 DIAGNOSIS — Z95828 Presence of other vascular implants and grafts: Secondary | ICD-10-CM

## 2019-10-24 LAB — CMP (CANCER CENTER ONLY)
ALT: 48 U/L — ABNORMAL HIGH (ref 0–44)
AST: 25 U/L (ref 15–41)
Albumin: 3.1 g/dL — ABNORMAL LOW (ref 3.5–5.0)
Alkaline Phosphatase: 50 U/L (ref 38–126)
Anion gap: 6 (ref 5–15)
BUN: 25 mg/dL — ABNORMAL HIGH (ref 8–23)
CO2: 25 mmol/L (ref 22–32)
Calcium: 8.7 mg/dL — ABNORMAL LOW (ref 8.9–10.3)
Chloride: 111 mmol/L (ref 98–111)
Creatinine: 1.43 mg/dL — ABNORMAL HIGH (ref 0.44–1.00)
GFR, Est AFR Am: 44 mL/min — ABNORMAL LOW (ref >60)
GFR, Estimated: 38 mL/min — ABNORMAL LOW (ref >60)
Glucose, Bld: 87 mg/dL (ref 70–99)
Potassium: 4.1 mmol/L (ref 3.5–5.1)
Sodium: 142 mmol/L (ref 135–145)
Total Bilirubin: 0.3 mg/dL (ref 0.3–1.2)
Total Protein: 5.4 g/dL — ABNORMAL LOW (ref 6.5–8.1)

## 2019-10-24 LAB — CBC WITH DIFFERENTIAL (CANCER CENTER ONLY)
Abs Immature Granulocytes: 0.02 10*3/uL (ref 0.00–0.07)
Basophils Absolute: 0 10*3/uL (ref 0.0–0.1)
Basophils Relative: 0 %
Eosinophils Absolute: 0.1 10*3/uL (ref 0.0–0.5)
Eosinophils Relative: 2 %
HCT: 31.5 % — ABNORMAL LOW (ref 36.0–46.0)
Hemoglobin: 9.9 g/dL — ABNORMAL LOW (ref 12.0–15.0)
Immature Granulocytes: 0 %
Lymphocytes Relative: 17 %
Lymphs Abs: 0.8 10*3/uL (ref 0.7–4.0)
MCH: 32.6 pg (ref 26.0–34.0)
MCHC: 31.4 g/dL (ref 30.0–36.0)
MCV: 103.6 fL — ABNORMAL HIGH (ref 80.0–100.0)
Monocytes Absolute: 0.3 10*3/uL (ref 0.1–1.0)
Monocytes Relative: 6 %
Neutro Abs: 3.4 10*3/uL (ref 1.7–7.7)
Neutrophils Relative %: 75 %
Platelet Count: 121 10*3/uL — ABNORMAL LOW (ref 150–400)
RBC: 3.04 MIL/uL — ABNORMAL LOW (ref 3.87–5.11)
RDW: 14.1 % (ref 11.5–15.5)
WBC Count: 4.6 10*3/uL (ref 4.0–10.5)
nRBC: 0 % (ref 0.0–0.2)

## 2019-10-24 MED ORDER — SODIUM CHLORIDE 0.9% FLUSH
10.0000 mL | INTRAVENOUS | Status: DC | PRN
  Administered 2019-10-24: 10 mL
  Filled 2019-10-24: qty 10

## 2019-10-24 MED ORDER — HEPARIN SOD (PORK) LOCK FLUSH 100 UNIT/ML IV SOLN
500.0000 [IU] | Freq: Once | INTRAVENOUS | Status: AC
  Administered 2019-10-24: 10:00:00 500 [IU] via INTRAVENOUS

## 2019-10-24 MED ORDER — SODIUM CHLORIDE (PF) 0.9 % IJ SOLN
INTRAMUSCULAR | Status: AC
  Filled 2019-10-24: qty 50

## 2019-10-24 MED ORDER — IOHEXOL 300 MG/ML  SOLN
75.0000 mL | Freq: Once | INTRAMUSCULAR | Status: AC | PRN
  Administered 2019-10-24: 10:00:00 75 mL via INTRAVENOUS

## 2019-10-24 MED ORDER — HEPARIN SOD (PORK) LOCK FLUSH 100 UNIT/ML IV SOLN
INTRAVENOUS | Status: AC
  Filled 2019-10-24: qty 5

## 2019-10-27 ENCOUNTER — Inpatient Hospital Stay (HOSPITAL_BASED_OUTPATIENT_CLINIC_OR_DEPARTMENT_OTHER): Payer: Medicare PPO | Admitting: Internal Medicine

## 2019-10-27 ENCOUNTER — Other Ambulatory Visit: Payer: Self-pay | Admitting: Family Medicine

## 2019-10-27 ENCOUNTER — Other Ambulatory Visit: Payer: Self-pay

## 2019-10-27 ENCOUNTER — Encounter: Payer: Self-pay | Admitting: Internal Medicine

## 2019-10-27 VITALS — BP 132/85 | HR 78 | Temp 98.5°F | Resp 18 | Ht 66.0 in | Wt 190.6 lb

## 2019-10-27 DIAGNOSIS — Z5111 Encounter for antineoplastic chemotherapy: Secondary | ICD-10-CM

## 2019-10-27 DIAGNOSIS — Z7189 Other specified counseling: Secondary | ICD-10-CM | POA: Diagnosis not present

## 2019-10-27 DIAGNOSIS — I10 Essential (primary) hypertension: Secondary | ICD-10-CM | POA: Diagnosis not present

## 2019-10-27 DIAGNOSIS — C3431 Malignant neoplasm of lower lobe, right bronchus or lung: Secondary | ICD-10-CM | POA: Diagnosis not present

## 2019-10-27 DIAGNOSIS — C7951 Secondary malignant neoplasm of bone: Secondary | ICD-10-CM | POA: Diagnosis not present

## 2019-10-27 DIAGNOSIS — R5383 Other fatigue: Secondary | ICD-10-CM | POA: Diagnosis not present

## 2019-10-27 DIAGNOSIS — R634 Abnormal weight loss: Secondary | ICD-10-CM | POA: Diagnosis not present

## 2019-10-27 DIAGNOSIS — Z5112 Encounter for antineoplastic immunotherapy: Secondary | ICD-10-CM

## 2019-10-27 DIAGNOSIS — C7931 Secondary malignant neoplasm of brain: Secondary | ICD-10-CM | POA: Diagnosis not present

## 2019-10-27 DIAGNOSIS — R197 Diarrhea, unspecified: Secondary | ICD-10-CM | POA: Diagnosis not present

## 2019-10-27 DIAGNOSIS — Z7689 Persons encountering health services in other specified circumstances: Secondary | ICD-10-CM | POA: Diagnosis not present

## 2019-10-27 MED ORDER — PROCHLORPERAZINE MALEATE 10 MG PO TABS: 10.0000 mg | ORAL_TABLET | Freq: Four times a day (QID) | ORAL | 0 refills | Status: DC | PRN

## 2019-10-27 NOTE — Progress Notes (Signed)
[facility] [phone]   [phone]  OFFICE PROGRESS NOTE  Rutherford Guys, MD [address] [name] [address]  DIAGNOSIS: Metastatic small cell lung cancer initially diagnosed as Limited stage (T2b, N2, M0) small cell lung cancer, pending further staging work-up diagnosed and January 2020.  She presented with right lower lobe lung mass in addition to right hilar and mediastinal lymphadenopathy.  She has evidence of metastatic disease to the brain in March 2021.  PRIOR THERAPY:  1) Systemic chemotherapy concurrent with radiation.  Chemotherapy withcisplatin 80 mg/M2 on day 1 and etoposide 100 mg/M2 on days 1, 2 and 3 every 3 weeks.First dose September 03, 2018. Status post5cycles.Dose reducedstarting fromcycle #4to cisplatin 40 mg/m2 and etoposide 80 mg/m2 due to renal insufficiency. 2) whole brain irradiation under the care of Dr. Sondra Come.  CURRENT THERAPY: Systemic chemotherapy with carboplatin for AUC of 5 on day 1, etoposide 100 mg/M2 on days 1, 2 and 3 with Neulasta support in addition to Imfinzi 1500 mg IV every 3 weeks during chemotherapy course followed by maintenance Imfinzi every 4 weeks.   INTERVAL HISTORY: [name] 69 y.o. female returns to the clinic today for follow-up visit accompanied by her husband.  The patient was recently diagnosed with metastatic brain lesions and she underwent a course of whole brain radiation under the care of Dr. Sondra Come.  The patient is feeling fine today with no concerning complaints.  She denied having any chest pain, shortness of breath, cough or hemoptysis.  She denied having any fever or chills.  She has no nausea, vomiting, diarrhea or constipation.  She has no headache or visual changes.  She is currently on a tapered dose of Decadron.  She is here today for evaluation and discussion of her treatment options after repeating CT scan of the chest for restaging of her disease.  MEDICAL  HISTORY: Past Medical History:  Diagnosis Date  . Arthritis   . Chronic pain   . Hyperlipidemia   . Hypertension   . Low blood magnesium 10/04/2018  . lung ca dx'd 06/2018  . Stroke ([place])    06/19/2018 minor    ALLERGIES:  has No Known Allergies.  MEDICATIONS:  Current Outpatient Medications  Medication Sig Dispense Refill  . amLODipine (NORVASC) 10 MG tablet Take 1 tablet (10 mg total) by mouth daily as needed.    Marland Kitchen atorvastatin (LIPITOR) 80 MG tablet TAKE 1 TABLET (80 MG TOTAL) BY MOUTH DAILY AT 6 PM. 90 tablet 3  . dexamethasone (DECADRON) 4 MG tablet Take 1 tablet (4 mg total) by mouth daily. 30 tablet 0  . DULoxetine (CYMBALTA) 30 MG capsule TAKE 1 CAPSULE BY MOUTH EVERY DAY (Patient taking differently: Take 30 mg by mouth daily. ) 90 capsule 2  . lisinopril (ZESTRIL) 40 MG tablet Take 1 tablet (40 mg total) by mouth daily. 90 tablet 0  . ondansetron (ZOFRAN) 4 MG tablet Take 1 tablet (4 mg total) by mouth every 8 (eight) hours as needed for nausea or vomiting. 20 tablet 3  . pregabalin (LYRICA) 50 MG capsule TAKE 1 CAPSULE BY MOUTH 3 TIMES A DAY (Patient taking differently: Take 50 mg by mouth 3 (three) times daily. ) 90 capsule 3  . rivaroxaban (XARELTO) 20 MG TABS tablet Take 1 tablet (20 mg total) by mouth daily with supper. 30 tablet 2  . acetaminophen (TYLENOL) 650 MG CR tablet Take 650 mg by mouth every 8 (eight) hours as needed  for pain.    . Clobetasol Prop Emollient Base (CLOBETASOL PROPIONATE E) 0.05 % emollient cream Apply 1 application topically 2 (two) times daily. (Patient not taking: Reported on 10/27/2019) 60 g 3  . traMADol (ULTRAM) 50 MG tablet Take 1-2 tablets (50-100 mg total) by mouth at bedtime as needed. (Patient not taking: Reported on 10/27/2019) 30 tablet 1   No current facility-administered medications for this visit.    SURGICAL HISTORY:  Past Surgical History:  Procedure Laterality Date  . BIOPSY  06/19/2018   Procedure: BIOPSY;  Surgeon: Laurence Spates, MD;  Location: [facility];  Service: Endoscopy;;  . BIOPSY  10/11/2018   Procedure: BIOPSY;  Surgeon: Laurence Spates, MD;  Location: [facility];  Service: Endoscopy;;  . ENDARTERECTOMY Right 06/26/2018   Procedure: ENDARTERECTOMY CAROTID RIGHT;  Surgeon: Serafina Mitchell, MD;  Location: [facility];  Service: Vascular;  Laterality: Right;  . ESOPHAGOGASTRODUODENOSCOPY (EGD) WITH PROPOFOL N/A 06/19/2018   Procedure: ESOPHAGOGASTRODUODENOSCOPY (EGD) WITH PROPOFOL;  Surgeon: Laurence Spates, MD;  Location: [facility];  Service: Endoscopy;  Laterality: N/A;  . FLEXIBLE SIGMOIDOSCOPY N/A 10/11/2018   Procedure: FLEXIBLE SIGMOIDOSCOPY;  Surgeon: Laurence Spates, MD;  Location: [facility];  Service: Endoscopy;  Laterality: N/A;  . IR IMAGING GUIDED PORT INSERTION  10/07/2018  . PATCH ANGIOPLASTY Right 06/26/2018   Procedure: PATCH ANGIOPLASTY USING A XENOSURE 1cm x 6cm BIOLOGIC PATCH;  Surgeon: Serafina Mitchell, MD;  Location: [facility];  Service: Vascular;  Laterality: Right;  Marland Kitchen VIDEO BRONCHOSCOPY WITH ENDOBRONCHIAL ULTRASOUND N/A 08/14/2018   Procedure: VIDEO BRONCHOSCOPY WITH ENDOBRONCHIAL ULTRASOUND;  Surgeon: Collene Gobble, MD;  Location: [facility] OR;  Service: Thoracic;  Laterality: N/A;    REVIEW OF SYSTEMS:  Constitutional: positive for fatigue Eyes: negative Ears, nose, mouth, throat, and face: negative Respiratory: negative Cardiovascular: negative Gastrointestinal: negative Genitourinary:negative Integument/breast: negative Hematologic/lymphatic: negative Musculoskeletal:negative Neurological: negative Behavioral/Psych: negative Endocrine: negative Allergic/Immunologic: negative   PHYSICAL EXAMINATION: General appearance: alert, cooperative and no distress Head: Normocephalic, without obvious abnormality, atraumatic Neck: no adenopathy, no JVD, supple, symmetrical, trachea midline and thyroid not enlarged, symmetric, no tenderness/mass/nodules Lymph nodes: Cervical, supraclavicular,  and axillary nodes normal. Resp: clear to auscultation bilaterally Back: symmetric, no curvature. ROM normal. No CVA tenderness. Cardio: regular rate and rhythm, S1, S2 normal, no murmur, click, rub or gallop GI: soft, non-tender; bowel sounds normal; no masses,  no organomegaly Extremities: extremities normal, atraumatic, no cyanosis or edema Neurologic: Alert and oriented X 3, normal strength and tone. Normal symmetric reflexes. Normal coordination and gait  ECOG PERFORMANCE STATUS: 1 - Symptomatic but completely ambulatory  Blood pressure 132/85, pulse 78, temperature 98.5 F (36.9 C), temperature source Temporal, resp. rate 18, height 5\' 6"  (1.676 m), weight 190 lb 9.6 oz (86.5 kg), SpO2 100 %.  LABORATORY DATA: Lab Results  Component Value Date   WBC 4.6 10/24/2019   HGB 9.9 (L) 10/24/2019   HCT 31.5 (L) 10/24/2019   MCV 103.6 (H) 10/24/2019   PLT 121 (L) 10/24/2019      Chemistry      Component Value Date/Time   NA 142 10/24/2019 0819   NA 141 10/13/2019 1558   K 4.1 10/24/2019 0819   CL 111 10/24/2019 0819   CO2 25 10/24/2019 0819   BUN 25 (H) 10/24/2019 0819   BUN 26 10/13/2019 1558   CREATININE 1.43 (H) 10/24/2019 0819      Component Value Date/Time   CALCIUM 8.7 (L) 10/24/2019 0819   ALKPHOS 50 10/24/2019 4580  AST 25 10/24/2019 0819   ALT 48 (H) 10/24/2019 0819   BILITOT 0.3 10/24/2019 0819       RADIOGRAPHIC STUDIES: CT Head Wo Contrast  Result Date: 10/02/2019 CLINICAL DATA:  Headaches, on Xarelto, confusion; altered mental status unclear cause. Additional history provided: History of lung cancer diagnosed 2019. EXAM: CT HEAD WITHOUT CONTRAST TECHNIQUE: Contiguous axial images were obtained from the base of the skull through the vertex without intravenous contrast. COMPARISON:  Brain MRI 01/20/2019 FINDINGS: Brain: There is an isodense mass measuring 3.9 x 3.1 x 3.2 centered along the inferior margin of the frontal horn of the right lateral ventricle.  There is mild surrounding edema within the right frontal lobe. This mass near completely effaces the right frontal horn and extends slightly across midline. 2.1 cm focus of edema within the anterior left frontal lobe at the gray-white junction (for instance as seen on series 2, image 18). There is also extensive edema throughout much of the right cerebellum extending to the region of the vermis and right middle cerebellar peduncle with associated mass effect. Associated posterior fossa mass effect with partial effacement of the fourth ventricle. There is no evidence of acute intracranial hemorrhage. No extra-axial fluid collection. Additional ill-defined hypoattenuation within the cerebral white matter is nonspecific, but consistent with chronic small vessel ischemic disease. A tiny chronic lacunar infarct within the left cerebellum was better appreciated on prior MRI. Mild generalized parenchymal atrophy. Vascular: No hyperdense vessel.  Atherosclerotic calcifications. Skull: Normal. Negative for fracture or focal lesion. Sinuses/Orbits: Visualized orbits demonstrate no acute abnormality. Mild paranasal sinus mucosal thickening. No significant mastoid effusion. These results were called by telephone at the time of interpretation on 10/02/2019 at 12:16 pm to provider [facility] , who verbally acknowledged these results. IMPRESSION: 3.9 cm mass near completely effacing the frontal horn of the right lateral ventricle and extending slightly across midline. Mild surrounding edema within the right frontal lobe. Additional 2.1 cm focus of edema within the anterior left frontal lobe at the gray-white junction. There is also extensive edema throughout much of the right cerebellum and extending to the vermis and right middle cerebellar peduncle. Findings likely reflect multifocal intracranial metastatic disease. Contrast-enhanced brain MRI is recommended for further evaluation and to assess for additional intracranial  metastases. Posterior fossa mass effect with significant effacement of the fourth ventricle. No evidence of hydrocephalus on the current exam. Background chronic small vessel ischemic disease. Electronically Signed   By: Kellie Simmering DO   On: 10/02/2019 12:17   CT Chest W Contrast  Result Date: 10/24/2019 CLINICAL DATA:  Limited stage right small cell lung cancer status post radiation therapy and chemotherapy. Restaging. EXAM: CT CHEST WITH CONTRAST TECHNIQUE: Multidetector CT imaging of the chest was performed during intravenous contrast administration. CONTRAST:  37mL OMNIPAQUE IOHEXOL 300 MG/ML  SOLN COMPARISON:  08/09/2019 chest CT. FINDINGS: Cardiovascular: Normal heart size. No significant pericardial effusion/thickening. Three-vessel coronary atherosclerosis. Right internal jugular Port-A-Cath terminates at the cavoatrial junction. Atherosclerotic nonaneurysmal thoracic aorta. Normal caliber pulmonary arteries. No central pulmonary emboli. Mediastinum/Nodes: No discrete thyroid nodules. Unremarkable esophagus. No axillary adenopathy. Enlarged 3.4 cm static cyst diameter right paratracheal node (series 2/image 45), previously 3.5 cm using similar measurement technique, stable. Enlarged 1.4 cm subcarinal node (series 2/image 58), previously 1.4 cm, stable. No new pathologically enlarged mediastinal nodes. Enlarged 1.1 cm right hilar node (series 2/image 61), stable. No left hilar adenopathy. Lungs/Pleura: No pneumothorax. Trace dependent right pleural effusion, decreased. No left pleural effusion. Anterior right lower  lobe 2.8 x 2.2 cm solid pulmonary nodule (series 5/image 77), previously 2.7 x 2.1 cm, not appreciably changed. Sharply marginated patchy and bandlike right perihilar and right mid lung consolidation with associated volume loss and distortion, compatible with evolving postradiation change. A few scattered small solid pulmonary nodules in the basilar right upper lobe, largest 0.4 cm (series  5/image 57), stable. Solid subpleural 0.4 cm posterior left lower lobe nodule (series 5/image 86), stable. No new significant pulmonary nodules. Upper abdomen: Stable 1.3 cm left adrenal nodule, unchanged since 08/30/2018 PET-CT, where it was non FDG avid, compatible with a benign adenoma. Musculoskeletal: Sclerotic lesions in the posterior elements on the right at T5 and T6, mildly increased in density. New mild patchy sclerosis in the T6 vertebral body with new pathologic mild T6 vertebral fracture. Mild thoracic spondylosis. IMPRESSION: 1. Anterior right lower lobe 2.8 cm solid pulmonary nodule, not substantially changed. Expected evolution of postradiation change in the right lung. 2. Stable right hilar and mediastinal lymphadenopathy. 3. Sclerotic osseous metastases in the posterior elements on the right at T5 and T6, mildly increased in density. New mild patchy sclerosis in the T6 vertebral body with new pathologic mild T6 vertebral fracture. These findings are equivocal for evolving treatment response within previously occult bone metastasis versus progression of sclerotic osseous metastatic disease. 4. Trace dependent right pleural effusion, decreased. 5. Three-vessel coronary atherosclerosis. 6. Stable left adrenal adenoma. 7. Aortic Atherosclerosis (ICD10-I70.0). Electronically Signed   By: Ilona Sorrel M.D.   On: 10/24/2019 13:54   MR Brain W and Wo Contrast  Result Date: 10/02/2019 CLINICAL DATA:  Headaches. Altered mental status. History of lung cancer. Abnormal head CT EXAM: MRI HEAD WITHOUT AND WITH CONTRAST TECHNIQUE: Multiplanar, multiecho pulse sequences of the brain and surrounding structures were obtained without and with intravenous contrast. CONTRAST:  51mL GADAVIST GADOBUTROL 1 MMOL/ML IV SOLN COMPARISON:  Head CT 10/02/2019.  MRI 01/20/2019 FINDINGS: Brain: Necrotic metastasis in the right cerebellum measuring 4.6 x 4.1 x 3.1 Cm. Vasogenic edema with mass effect. Narrowing of the fourth  ventricle but no evidence complete obstruction. Solid metastasis with remarkable lack of necrosis in the right frontal region, possibly with the epicenter in the caudate head, measuring 3.6 x 3.5 x 3.4 cm. Surrounding vasogenic edema. Mass effect with flattening of the frontal horn of the right lateral ventricle. Right-to-left midline shift at this level of 4 mm. Generalized right-to-left shift is not present. Necrotic metastasis in the left frontal lobe measuring 2.4 x 1.9 x 2.3 cm. No associated vasogenic edema. Probable nodular ependymal metastasis along the medial wall of the left lateral ventricle, axial image 31. Question second ependymal metastasis at the floor of the left lateral ventricle, axial image 27. This may simply be a vessel. Elsewhere, there are moderate chronic small-vessel ischemic changes of the cerebral hemispheric white matter. No extra-axial fluid collection. Vascular: Major vessels at the base of the brain show flow. Skull and upper cervical spine: Negative Sinuses/Orbits: Clear/normal Other: None IMPRESSION: 4.6 x 4.1 x 3.1 cm necrotic metastasis within the right cerebellum with vasogenic edema and mass effect. 3.6 x 3.5 x 3.4 cm solid metastasis in the right frontal region, possibly with the epicenter in the caudate head. Vasogenic edema and mass effect. 2.4 x 1.9 x 2.3 cm partially necrotic metastasis in the left frontal lobe without vasogenic edema. Two or 3 mm ependymal metastasis along the medial wall of the left lateral ventricle, axial image 31. Question punctate metastasis along the floor of the left  lateral ventricle axial image 27. This may simply be a vessel. Electronically Signed   By: Nelson Chimes M.D.   On: 10/02/2019 14:14   DG Chest Portable 1 View  Result Date: 10/02/2019 CLINICAL DATA:  Weakness and shortness of breath. History of lung cancer. EXAM: PORTABLE CHEST 1 VIEW COMPARISON:  Chest CT 08/09/2019. Chest x-ray 10/29/2018 FINDINGS: 1130 hours. Left lung clear.  Volume loss noted right hemithorax, as before. Right paramediastinal and hilar opacity on today's chest x-ray compatible with treatment related changes noted on recent CT scan. Consolidative opacity in the right lower lobe and small right pleural effusion are similar. Right Port-A-Cath again noted. Telemetry leads overlie the chest. IMPRESSION: Volume loss right hemithorax with right paramediastinal and parahilar opacity consistent neoplastic and post treatment changes noted on recent chest CT. No pulmonary edema or pneumothorax. Electronically Signed   By: Misty Stanley M.D.   On: 10/02/2019 12:14    ASSESSMENT AND PLAN: This is a very pleasant 69 years old white female with relapsed small cell lung cancer initially diagnosed as limited stage small cell lung cancer diagnosed in January 2020 status post systemic chemotherapy initially with cisplatin and etoposide status post 5 cycles.  Her dose of cisplatin and etoposide was significantly reduced starting from cycle #4 secondary to worsening renal insufficiency.  Her treatment was concurrent with radiotherapy. The patient had evidence for metastatic disease to the brain and bone in March 2021. She underwent whole brain irradiation under the care of Dr. Dorathy Daft. She had repeat CT scan of the chest performed recently.  I personally and independently reviewed the scan images and discussed the results with the patient and her husband today. Her scan showed no clear evidence for progression in the lung but there was new sclerotic bone metastasis. I had a lengthy discussion with the patient and her husband today about her current disease status and treatment options. I gave the patient the option of palliative care and continuous observation with palliative radiation to the metastatic bone lesion versus palliative systemic chemotherapy with carboplatin for AUC of 5 on day 1, etoposide 100 mg/M2 on days 1, 2 and 3 with Neulasta support in addition to Imfinzi 1500  mg IV every 3 weeks initially with chemotherapy followed by maintenance treatment with Imfinzi every 4 weeks if the patient has no evidence for disease progression after cycle #4. The patient is interested in proceeding with systemic chemotherapy.  I discussed with him the adverse effect of this treatment including but not limited to alopecia, myelosuppression, nausea and vomiting, peripheral neuropathy, liver or renal dysfunction in addition to the immunotherapy adverse effects. She is expected to start the first dose of her treatment next week. I will arrange for the patient to come back for follow-up visit in 2 weeks for evaluation and management of any adverse effect of her treatment. She was advised to call immediately if she has any other concerning symptoms in the interval. For the recent right jugular vein thrombosis, the patient will continue her current treatment with Xarelto and I give her a refill of her medication today.  The patient voices understanding of current disease status and treatment options and is in agreement with the current care plan.  All questions were answered. The patient knows to call the clinic with any problems, questions or concerns. We can certainly see the patient much sooner if necessary.   Disclaimer: This note was dictated with voice recognition software. Similar sounding words can inadvertently be transcribed and may not  be corrected upon review.

## 2019-10-27 NOTE — Progress Notes (Signed)
DISCONTINUE ON PATHWAY REGIMEN - Small Cell Lung     A cycle is every 21 days:     Etoposide      Cisplatin   **Always confirm dose/schedule in your pharmacy ordering system**  REASON: Disease Progression PRIOR TREATMENT: LOS15: Cisplatin 75 mg/m2 D1 + Etoposide 100 mg/m2 IV D1-3 q21 Days x 4 Cycles with Concurrent Radiation TREATMENT RESPONSE: Progressive Disease (PD)  START ON PATHWAY REGIMEN - Small Cell Lung     Cycles 1 through 4: A cycle is every 21 days:     Durvalumab      Carboplatin      Etoposide    Cycles 5 and beyond: A cycle is every 28 days:     Durvalumab   **Always confirm dose/schedule in your pharmacy ordering system**  Patient Characteristics: Relapsed or Progressive Disease, Second Line, Relapse > 6 Months Therapeutic Status: Relapsed or Progressive Disease Line of Therapy: Second Line Time to Relapse: Relapse > 6 Months Intent of Therapy: Non-Curative / Palliative Intent, Discussed with Patient

## 2019-10-27 NOTE — Telephone Encounter (Signed)
Requested medication (s) are due for refill today: yes  Requested medication (s) are on the active medication list: no  Last refill:  08/05/19  Future visit scheduled: yes  Notes to clinic:  D/C'd by Dr Bonner Puna 10/05/19    Requested Prescriptions  Pending Prescriptions Disp Refills   lisinopril (ZESTRIL) 40 MG tablet [Pharmacy Med Name: LISINOPRIL 40 MG TABLET] 90 tablet 0    Sig: TAKE 1 TABLET BY MOUTH EVERY DAY      Cardiovascular:  ACE Inhibitors Failed - 10/27/2019  1:24 AM      Failed - Cr in normal range and within 180 days    Creatinine  Date Value Ref Range Status  10/24/2019 1.43 (H) 0.44 - 1.00 mg/dL Final          Passed - K in normal range and within 180 days    Potassium  Date Value Ref Range Status  10/24/2019 4.1 3.5 - 5.1 mmol/L Final          Passed - Patient is not pregnant      Passed - Last BP in normal range    BP Readings from Last 1 Encounters:  10/13/19 91/60          Passed - Valid encounter within last 6 months    Recent Outpatient Visits           2 weeks ago Metastatic small cell carcinoma to brain [facility])   [facility], Lilia Argue, MD   2 weeks ago Metastatic small cell carcinoma to brain [facility])   [facility], Lilia Argue, MD   1 month ago Neuropathy   [facility], Lilia Argue, MD   4 months ago Neuropathy   [facility], Lilia Argue, MD   6 months ago Neuropathy   [facility], Lilia Argue, MD       Future Appointments             In 1 month Rutherford Guys, MD Primary [facility] at [place], [facility]

## 2019-10-29 ENCOUNTER — Telehealth: Payer: Self-pay | Admitting: Internal Medicine

## 2019-10-29 NOTE — Progress Notes (Addendum)
Pharmacist Chemotherapy Monitoring - Initial Assessment    Anticipated start date: 11/04/19   Regimen:  . Are orders appropriate based on the patient's diagnosis, regimen, and cycle? Yes . Does the plan date match the patient's scheduled date? Yes . Is the sequencing of drugs appropriate? Yes . Are the premedications appropriate for the patient's regimen? Yes . Prior Authorization for treatment is: Pending o If applicable, is the correct biosimilar selected based on the patient's insurance? not applicable  Organ Function and Labs: Marland Kitchen Are dose adjustments needed based on the patient's renal function, hepatic function, or hematologic function? Yes . Are appropriate labs ordered prior to the start of patient's treatment? Yes . Other organ system assessment, if indicated: N/A . The following baseline labs, if indicated, have been ordered: durvalumab TSH  Dose Assessment: . Are the drug doses appropriate? Yes . Are the following correct: o Drug concentrations Yes o IV fluid compatible with drug Yes o Administration routes Yes o Timing of therapy Yes . If applicable, does the patient have documented access for treatment and/or plans for port-a-cath placement? yes . If applicable, have lifetime cumulative doses been properly documented and assessed? not applicable Lifetime Dose Tracking  No doses have been documented on this patient for the following tracked chemicals: Doxorubicin, Epirubicin, Idarubicin, Daunorubicin, Mitoxantrone, Bleomycin, Oxaliplatin, Carboplatin, Liposomal Doxorubicin  o   Toxicity Monitoring/Prevention: . The patient has the following take home antiemetics prescribed: Ondansetron and Prochlorperazine . The patient has the following take home medications prescribed: N/A . Medication allergies and previous infusion related reactions, if applicable, have been reviewed and addressed. No . The patient's current medication list has been assessed for drug-drug interactions  with their chemotherapy regimen. no significant drug-drug interactions were identified on review.  Order Review: . Are the treatment plan orders signed? Yes . Is the patient scheduled to see a provider prior to their treatment? No  I verify that I have reviewed each item in the above checklist and answered each question accordingly.  Kalima Saylor D 10/29/2019 4:01 PM

## 2019-10-29 NOTE — Telephone Encounter (Signed)
Scheduled per los. Called and spoke with patient. Confirmed appt 

## 2019-10-31 ENCOUNTER — Other Ambulatory Visit: Payer: Self-pay | Admitting: Family Medicine

## 2019-10-31 NOTE — Progress Notes (Signed)
Pharmacist Chemotherapy Monitoring - Initial Assessment    Anticipated start date: 11/03/19  Regimen:  . Are orders appropriate based on the patient's diagnosis, regimen, and cycle? Yes . Does the plan date match the patient's scheduled date? Yes . Is the sequencing of drugs appropriate? Yes . Are the premedications appropriate for the patient's regimen? Yes . Prior Authorization for treatment is: Approved o If applicable, is the correct biosimilar selected based on the patient's insurance? yes  Organ Function and Labs: Marland Kitchen Are dose adjustments needed based on the patient's renal function, hepatic function, or hematologic function? Yes . Are appropriate labs ordered prior to the start of patient's treatment? Yes . Other organ system assessment, if indicated: N/A . The following baseline labs, if indicated, have been ordered: durvalumab: baseline TSH +/- T4  Dose Assessment: . Are the drug doses appropriate? Yes . Are the following correct: o Drug concentrations Yes o IV fluid compatible with drug Yes o Administration routes Yes o Timing of therapy Yes . If applicable, does the patient have documented access for treatment and/or plans for port-a-cath placement? not applicable . If applicable, have lifetime cumulative doses been properly documented and assessed? yes Lifetime Dose Tracking  No doses have been documented on this patient for the following tracked chemicals: Doxorubicin, Epirubicin, Idarubicin, Daunorubicin, Mitoxantrone, Bleomycin, Oxaliplatin, Carboplatin, Liposomal Doxorubicin  o   Toxicity Monitoring/Prevention: . The patient has the following take home antiemetics prescribed: Prochlorperazine . The patient has the following take home medications prescribed: N/A . Medication allergies and previous infusion related reactions, if applicable, have been reviewed and addressed. Yes . The patient's current medication list has been assessed for drug-drug interactions with  their chemotherapy regimen. no significant drug-drug interactions were identified on review.  Order Review: . Are the treatment plan orders signed? Yes . Is the patient scheduled to see a provider prior to their treatment? Yes  I verify that I have reviewed each item in the above checklist and answered each question accordingly.  [name] 10/31/2019 1:16 PM

## 2019-10-31 NOTE — Telephone Encounter (Signed)
Patient is requesting a refill of the following medications: Requested Prescriptions   Pending Prescriptions Disp Refills   pregabalin (LYRICA) 50 MG capsule [Pharmacy Med Name: PREGABALIN 50 MG CAPSULE] 90 capsule 2    Sig: TAKE 1 CAPSULE 3 TIMES A DAY    Date of patient request: 10/31/2019 Last office visit: 10/13/2019 Date of last refill: 07/14/2020 Last refill amount: 90

## 2019-11-03 ENCOUNTER — Inpatient Hospital Stay: Payer: Medicare PPO

## 2019-11-03 ENCOUNTER — Other Ambulatory Visit: Payer: Self-pay

## 2019-11-03 VITALS — BP 145/98 | HR 97 | Temp 99.1°F | Resp 18

## 2019-11-03 DIAGNOSIS — C7931 Secondary malignant neoplasm of brain: Secondary | ICD-10-CM

## 2019-11-03 DIAGNOSIS — R634 Abnormal weight loss: Secondary | ICD-10-CM | POA: Diagnosis not present

## 2019-11-03 DIAGNOSIS — C3431 Malignant neoplasm of lower lobe, right bronchus or lung: Secondary | ICD-10-CM | POA: Diagnosis not present

## 2019-11-03 DIAGNOSIS — R5383 Other fatigue: Secondary | ICD-10-CM | POA: Diagnosis not present

## 2019-11-03 DIAGNOSIS — Z5112 Encounter for antineoplastic immunotherapy: Secondary | ICD-10-CM | POA: Diagnosis not present

## 2019-11-03 DIAGNOSIS — Z95828 Presence of other vascular implants and grafts: Secondary | ICD-10-CM

## 2019-11-03 DIAGNOSIS — C7951 Secondary malignant neoplasm of bone: Secondary | ICD-10-CM | POA: Diagnosis not present

## 2019-11-03 DIAGNOSIS — Z7689 Persons encountering health services in other specified circumstances: Secondary | ICD-10-CM | POA: Diagnosis not present

## 2019-11-03 DIAGNOSIS — Z5111 Encounter for antineoplastic chemotherapy: Secondary | ICD-10-CM | POA: Diagnosis not present

## 2019-11-03 DIAGNOSIS — R197 Diarrhea, unspecified: Secondary | ICD-10-CM | POA: Diagnosis not present

## 2019-11-03 LAB — CBC WITH DIFFERENTIAL (CANCER CENTER ONLY)
Abs Immature Granulocytes: 0.03 10*3/uL (ref 0.00–0.07)
Basophils Absolute: 0 10*3/uL (ref 0.0–0.1)
Basophils Relative: 0 %
Eosinophils Absolute: 0 10*3/uL (ref 0.0–0.5)
Eosinophils Relative: 1 %
HCT: 31.6 % — ABNORMAL LOW (ref 36.0–46.0)
Hemoglobin: 10 g/dL — ABNORMAL LOW (ref 12.0–15.0)
Immature Granulocytes: 1 %
Lymphocytes Relative: 10 %
Lymphs Abs: 0.6 10*3/uL — ABNORMAL LOW (ref 0.7–4.0)
MCH: 32.4 pg (ref 26.0–34.0)
MCHC: 31.6 g/dL (ref 30.0–36.0)
MCV: 102.3 fL — ABNORMAL HIGH (ref 80.0–100.0)
Monocytes Absolute: 0.5 10*3/uL (ref 0.1–1.0)
Monocytes Relative: 8 %
Neutro Abs: 4.6 10*3/uL (ref 1.7–7.7)
Neutrophils Relative %: 80 %
Platelet Count: 145 10*3/uL — ABNORMAL LOW (ref 150–400)
RBC: 3.09 MIL/uL — ABNORMAL LOW (ref 3.87–5.11)
RDW: 14.2 % (ref 11.5–15.5)
WBC Count: 5.7 10*3/uL (ref 4.0–10.5)
nRBC: 0 % (ref 0.0–0.2)

## 2019-11-03 LAB — CMP (CANCER CENTER ONLY)
ALT: 23 U/L (ref 0–44)
AST: 17 U/L (ref 15–41)
Albumin: 2.9 g/dL — ABNORMAL LOW (ref 3.5–5.0)
Alkaline Phosphatase: 52 U/L (ref 38–126)
Anion gap: 9 (ref 5–15)
BUN: 24 mg/dL — ABNORMAL HIGH (ref 8–23)
CO2: 23 mmol/L (ref 22–32)
Calcium: 8.6 mg/dL — ABNORMAL LOW (ref 8.9–10.3)
Chloride: 110 mmol/L (ref 98–111)
Creatinine: 1.49 mg/dL — ABNORMAL HIGH (ref 0.44–1.00)
GFR, Est AFR Am: 42 mL/min — ABNORMAL LOW (ref >60)
GFR, Estimated: 36 mL/min — ABNORMAL LOW (ref >60)
Glucose, Bld: 102 mg/dL — ABNORMAL HIGH (ref 70–99)
Potassium: 3.9 mmol/L (ref 3.5–5.1)
Sodium: 142 mmol/L (ref 135–145)
Total Bilirubin: 0.3 mg/dL (ref 0.3–1.2)
Total Protein: 5.9 g/dL — ABNORMAL LOW (ref 6.5–8.1)

## 2019-11-03 LAB — TSH: TSH: 3.516 u[IU]/mL (ref 0.308–3.960)

## 2019-11-03 MED ORDER — SODIUM CHLORIDE 0.9 % IV SOLN
150.0000 mg | Freq: Once | INTRAVENOUS | Status: AC
  Administered 2019-11-03: 150 mg via INTRAVENOUS
  Filled 2019-11-03: qty 150

## 2019-11-03 MED ORDER — SODIUM CHLORIDE 0.9 % IV SOLN
100.0000 mg/m2 | Freq: Once | INTRAVENOUS | Status: AC
  Administered 2019-11-03: 200 mg via INTRAVENOUS
  Filled 2019-11-03: qty 10

## 2019-11-03 MED ORDER — SODIUM CHLORIDE 0.9% FLUSH
10.0000 mL | INTRAVENOUS | Status: DC | PRN
  Administered 2019-11-03 (×2): 10 mL
  Filled 2019-11-03: qty 10

## 2019-11-03 MED ORDER — HEPARIN SOD (PORK) LOCK FLUSH 100 UNIT/ML IV SOLN
500.0000 [IU] | Freq: Once | INTRAVENOUS | Status: AC | PRN
  Administered 2019-11-03: 500 [IU]
  Filled 2019-11-03: qty 5

## 2019-11-03 MED ORDER — SODIUM CHLORIDE 0.9 % IV SOLN
10.0000 mg | Freq: Once | INTRAVENOUS | Status: AC
  Administered 2019-11-03: 10 mg via INTRAVENOUS
  Filled 2019-11-03: qty 10

## 2019-11-03 MED ORDER — SODIUM CHLORIDE 0.9% FLUSH
10.0000 mL | INTRAVENOUS | Status: DC | PRN
  Filled 2019-11-03: qty 10

## 2019-11-03 MED ORDER — PALONOSETRON HCL INJECTION 0.25 MG/5ML
INTRAVENOUS | Status: AC
  Filled 2019-11-03: qty 5

## 2019-11-03 MED ORDER — SODIUM CHLORIDE 0.9 % IV SOLN
389.0000 mg | Freq: Once | INTRAVENOUS | Status: AC
  Administered 2019-11-03: 11:00:00 390 mg via INTRAVENOUS
  Filled 2019-11-03: qty 39

## 2019-11-03 MED ORDER — SODIUM CHLORIDE 0.9 % IV SOLN
1500.0000 mg | Freq: Once | INTRAVENOUS | Status: AC
  Administered 2019-11-03: 1500 mg via INTRAVENOUS
  Filled 2019-11-03: qty 30

## 2019-11-03 MED ORDER — PALONOSETRON HCL INJECTION 0.25 MG/5ML
0.2500 mg | Freq: Once | INTRAVENOUS | Status: AC
  Administered 2019-11-03: 0.25 mg via INTRAVENOUS

## 2019-11-03 MED ORDER — SODIUM CHLORIDE 0.9 % IV SOLN
Freq: Once | INTRAVENOUS | Status: AC
  Filled 2019-11-03: qty 250

## 2019-11-03 NOTE — Progress Notes (Signed)
Per Dr. Julien Nordmann, ok to treat with elevated BP. Patient aware she needs to f/u with PMD re: elevated BP.

## 2019-11-03 NOTE — Patient Instructions (Signed)
[facility] Discharge Instructions for Patients Receiving Chemotherapy  Today you received the following chemotherapy agents: durvalumab, etoposide, and carboplatin.  To help prevent nausea and vomiting after your treatment, we encourage you to take your nausea medication as directed.   If you develop nausea and vomiting that is not controlled by your nausea medication, call the clinic.   BELOW ARE SYMPTOMS THAT SHOULD BE REPORTED IMMEDIATELY:  *FEVER GREATER THAN 100.5 F  *CHILLS WITH OR WITHOUT FEVER  NAUSEA AND VOMITING THAT IS NOT CONTROLLED WITH YOUR NAUSEA MEDICATION  *UNUSUAL SHORTNESS OF BREATH  *UNUSUAL BRUISING OR BLEEDING  TENDERNESS IN MOUTH AND THROAT WITH OR WITHOUT PRESENCE OF ULCERS  *URINARY PROBLEMS  *BOWEL PROBLEMS  UNUSUAL RASH Items with * indicate a potential emergency and should be followed up as soon as possible.  Feel free to call the clinic should you have any questions or concerns. The clinic phone number is [phone].  Please show the [facility] at check-in to the Emergency Department and triage nurse.  Durvalumab injection What is this medicine? DURVALUMAB (dur VAL ue mab) is a monoclonal antibody. It is used to treat urothelial cancer and lung cancer. This medicine may be used for other purposes; ask your health care provider or pharmacist if you have questions. COMMON BRAND NAME(S): IMFINZI What should I tell my health care provider before I take this medicine? They need to know if you have any of these conditions:  diabetes  immune system problems  infection  inflammatory bowel disease  kidney disease  liver disease  lung or breathing disease  lupus  organ transplant  stomach or intestine problems  thyroid disease  an unusual or allergic reaction to durvalumab, other medicines, foods, dyes, or preservatives  pregnant or trying to get pregnant  breast-feeding How should I use this  medicine? This medicine is for infusion into a vein. It is given by a health care professional in a hospital or clinic setting. A special MedGuide will be given to you before each treatment. Be sure to read this information carefully each time. Talk to your pediatrician regarding the use of this medicine in children. Special care may be needed. Overdosage: If you think you have taken too much of this medicine contact a poison control center or emergency room at once. NOTE: This medicine is only for you. Do not share this medicine with others. What if I miss a dose? It is important not to miss your dose. Call your doctor or health care professional if you are unable to keep an appointment. What may interact with this medicine? Interactions have not been studied. This list may not describe all possible interactions. Give your health care provider a list of all the medicines, herbs, non-prescription drugs, or dietary supplements you use. Also tell them if you smoke, drink alcohol, or use illegal drugs. Some items may interact with your medicine. What should I watch for while using this medicine? This drug may make you feel generally unwell. Continue your course of treatment even though you feel ill unless your doctor tells you to stop. You may need blood work done while you are taking this medicine. Do not become pregnant while taking this medicine or for 3 months after stopping it. Women should inform their doctor if they wish to become pregnant or think they might be pregnant. There is a potential for serious side effects to an unborn child. Talk to your health care professional or pharmacist for more information.  Do not breast-feed an infant while taking this medicine or for 3 months after stopping it. What side effects may I notice from receiving this medicine? Side effects that you should report to your doctor or health care professional as soon as possible:  allergic reactions like skin rash,  itching or hives, swelling of the face, lips, or tongue  black, tarry stools  bloody or watery diarrhea  breathing problems  change in emotions or moods  change in sex drive  changes in vision  chest pain or chest tightness  chills  confusion  cough  facial flushing  fever  headache  signs and symptoms of high blood sugar such as dizziness; dry mouth; dry skin; fruity breath; nausea; stomach pain; increased hunger or thirst; increased urination  signs and symptoms of liver injury like dark yellow or brown urine; general ill feeling or flu-like symptoms; light-colored stools; loss of appetite; nausea; right upper belly pain; unusually weak or tired; yellowing of the eyes or skin  stomach pain  trouble passing urine or change in the amount of urine  weight gain or weight loss Side effects that usually do not require medical attention (report these to your doctor or health care professional if they continue or are bothersome):  bone pain  constipation  loss of appetite  muscle pain  nausea  swelling of the ankles, feet, hands  tiredness This list may not describe all possible side effects. Call your doctor for medical advice about side effects. You may report side effects to FDA at 1-800-FDA-1088. Where should I keep my medicine? This drug is given in a hospital or clinic and will not be stored at home. NOTE: This sheet is a summary. It may not cover all possible information. If you have questions about this medicine, talk to your doctor, pharmacist, or health care provider.  2020 Elsevier/Gold Standard (2016-09-12 19:25:04)  Etoposide, VP-16 injection What is this medicine? ETOPOSIDE, VP-16 (e toe POE side) is a chemotherapy drug. It is used to treat testicular cancer, lung cancer, and other cancers. This medicine may be used for other purposes; ask your health care provider or pharmacist if you have questions. COMMON BRAND NAME(S): Etopophos, Toposar,  VePesid What should I tell my health care provider before I take this medicine? They need to know if you have any of these conditions:  infection  kidney disease  liver disease  low blood counts, like low white cell, platelet, or red cell counts  an unusual or allergic reaction to etoposide, other medicines, foods, dyes, or preservatives  pregnant or trying to get pregnant  breast-feeding How should I use this medicine? This medicine is for infusion into a vein. It is administered in a hospital or clinic by a specially trained health care professional. Talk to your pediatrician regarding the use of this medicine in children. Special care may be needed. Overdosage: If you think you have taken too much of this medicine contact a poison control center or emergency room at once. NOTE: This medicine is only for you. Do not share this medicine with others. What if I miss a dose? It is important not to miss your dose. Call your doctor or health care professional if you are unable to keep an appointment. What may interact with this medicine? This medicine may interact with the following medications:  warfarin This list may not describe all possible interactions. Give your health care provider a list of all the medicines, herbs, non-prescription drugs, or dietary supplements you use.  Also tell them if you smoke, drink alcohol, or use illegal drugs. Some items may interact with your medicine. What should I watch for while using this medicine? Visit your doctor for checks on your progress. This drug may make you feel generally unwell. This is not uncommon, as chemotherapy can affect healthy cells as well as cancer cells. Report any side effects. Continue your course of treatment even though you feel ill unless your doctor tells you to stop. In some cases, you may be given additional medicines to help with side effects. Follow all directions for their use. Call your doctor or health care  professional for advice if you get a fever, chills or sore throat, or other symptoms of a cold or flu. Do not treat yourself. This drug decreases your body's ability to fight infections. Try to avoid being around people who are sick. This medicine may increase your risk to bruise or bleed. Call your doctor or health care professional if you notice any unusual bleeding. Talk to your doctor about your risk of cancer. You may be more at risk for certain types of cancers if you take this medicine. Do not become pregnant while taking this medicine or for at least 6 months after stopping it. Women should inform their doctor if they wish to become pregnant or think they might be pregnant. Women of child-bearing potential will need to have a negative pregnancy test before starting this medicine. There is a potential for serious side effects to an unborn child. Talk to your health care professional or pharmacist for more information. Do not breast-feed an infant while taking this medicine. Men must use a latex condom during sexual contact with a woman while taking this medicine and for at least 4 months after stopping it. A latex condom is needed even if you have had a vasectomy. Contact your doctor right away if your partner becomes pregnant. Do not donate sperm while taking this medicine and for at least 4 months after you stop taking this medicine. Men should inform their doctors if they wish to father a child. This medicine may lower sperm counts. What side effects may I notice from receiving this medicine? Side effects that you should report to your doctor or health care professional as soon as possible:  allergic reactions like skin rash, itching or hives, swelling of the face, lips, or tongue  low blood counts - this medicine may decrease the number of white blood cells, red blood cells, and platelets. You may be at increased risk for infections and bleeding  nausea, vomiting  redness, blistering,  peeling or loosening of the skin, including inside the mouth  signs and symptoms of infection like fever; chills; cough; sore throat; pain or trouble passing urine  signs and symptoms of low red blood cells or anemia such as unusually weak or tired; feeling faint or lightheaded; falls; breathing problems  unusual bruising or bleeding Side effects that usually do not require medical attention (report to your doctor or health care professional if they continue or are bothersome):  changes in taste  diarrhea  hair loss  loss of appetite  mouth sores This list may not describe all possible side effects. Call your doctor for medical advice about side effects. You may report side effects to FDA at 1-800-FDA-1088. Where should I keep my medicine? This drug is given in a hospital or clinic and will not be stored at home. NOTE: This sheet is a summary. It may not cover all possible  information. If you have questions about this medicine, talk to your doctor, pharmacist, or health care provider.  2020 Elsevier/Gold Standard (2018-08-28 16:57:15)  Carboplatin injection What is this medicine? CARBOPLATIN (KAR boe pla tin) is a chemotherapy drug. It targets fast dividing cells, like cancer cells, and causes these cells to die. This medicine is used to treat ovarian cancer and many other cancers. This medicine may be used for other purposes; ask your health care provider or pharmacist if you have questions. COMMON BRAND NAME(S): Paraplatin What should I tell my health care provider before I take this medicine? They need to know if you have any of these conditions:  blood disorders  hearing problems  kidney disease  recent or ongoing radiation therapy  an unusual or allergic reaction to carboplatin, cisplatin, other chemotherapy, other medicines, foods, dyes, or preservatives  pregnant or trying to get pregnant  breast-feeding How should I use this medicine? This drug is usually given  as an infusion into a vein. It is administered in a hospital or clinic by a specially trained health care professional. Talk to your pediatrician regarding the use of this medicine in children. Special care may be needed. Overdosage: If you think you have taken too much of this medicine contact a poison control center or emergency room at once. NOTE: This medicine is only for you. Do not share this medicine with others. What if I miss a dose? It is important not to miss a dose. Call your doctor or health care professional if you are unable to keep an appointment. What may interact with this medicine?  medicines for seizures  medicines to increase blood counts like filgrastim, pegfilgrastim, sargramostim  some antibiotics like amikacin, gentamicin, neomycin, streptomycin, tobramycin  vaccines Talk to your doctor or health care professional before taking any of these medicines:  acetaminophen  aspirin  ibuprofen  ketoprofen  naproxen This list may not describe all possible interactions. Give your health care provider a list of all the medicines, herbs, non-prescription drugs, or dietary supplements you use. Also tell them if you smoke, drink alcohol, or use illegal drugs. Some items may interact with your medicine. What should I watch for while using this medicine? Your condition will be monitored carefully while you are receiving this medicine. You will need important blood work done while you are taking this medicine. This drug may make you feel generally unwell. This is not uncommon, as chemotherapy can affect healthy cells as well as cancer cells. Report any side effects. Continue your course of treatment even though you feel ill unless your doctor tells you to stop. In some cases, you may be given additional medicines to help with side effects. Follow all directions for their use. Call your doctor or health care professional for advice if you get a fever, chills or sore throat, or  other symptoms of a cold or flu. Do not treat yourself. This drug decreases your body's ability to fight infections. Try to avoid being around people who are sick. This medicine may increase your risk to bruise or bleed. Call your doctor or health care professional if you notice any unusual bleeding. Be careful brushing and flossing your teeth or using a toothpick because you may get an infection or bleed more easily. If you have any dental work done, tell your dentist you are receiving this medicine. Avoid taking products that contain aspirin, acetaminophen, ibuprofen, naproxen, or ketoprofen unless instructed by your doctor. These medicines may hide a fever. Do not become  pregnant while taking this medicine. Women should inform their doctor if they wish to become pregnant or think they might be pregnant. There is a potential for serious side effects to an unborn child. Talk to your health care professional or pharmacist for more information. Do not breast-feed an infant while taking this medicine. What side effects may I notice from receiving this medicine? Side effects that you should report to your doctor or health care professional as soon as possible:  allergic reactions like skin rash, itching or hives, swelling of the face, lips, or tongue  signs of infection - fever or chills, cough, sore throat, pain or difficulty passing urine  signs of decreased platelets or bleeding - bruising, pinpoint red spots on the skin, black, tarry stools, nosebleeds  signs of decreased red blood cells - unusually weak or tired, fainting spells, lightheadedness  breathing problems  changes in hearing  changes in vision  chest pain  high blood pressure  low blood counts - This drug may decrease the number of white blood cells, red blood cells and platelets. You may be at increased risk for infections and bleeding.  nausea and vomiting  pain, swelling, redness or irritation at the injection  site  pain, tingling, numbness in the hands or feet  problems with balance, talking, walking  trouble passing urine or change in the amount of urine Side effects that usually do not require medical attention (report to your doctor or health care professional if they continue or are bothersome):  hair loss  loss of appetite  metallic taste in the mouth or changes in taste This list may not describe all possible side effects. Call your doctor for medical advice about side effects. You may report side effects to FDA at 1-800-FDA-1088. Where should I keep my medicine? This drug is given in a hospital or clinic and will not be stored at home. NOTE: This sheet is a summary. It may not cover all possible information. If you have questions about this medicine, talk to your doctor, pharmacist, or health care provider.  2020 Elsevier/Gold Standard (2007-10-08 14:38:05)

## 2019-11-03 NOTE — Patient Instructions (Signed)

## 2019-11-04 ENCOUNTER — Other Ambulatory Visit: Payer: Self-pay

## 2019-11-04 ENCOUNTER — Inpatient Hospital Stay: Payer: Medicare PPO

## 2019-11-04 VITALS — BP 105/83 | HR 84 | Temp 98.5°F | Resp 18

## 2019-11-04 DIAGNOSIS — R634 Abnormal weight loss: Secondary | ICD-10-CM | POA: Diagnosis not present

## 2019-11-04 DIAGNOSIS — C7931 Secondary malignant neoplasm of brain: Secondary | ICD-10-CM | POA: Diagnosis not present

## 2019-11-04 DIAGNOSIS — C3431 Malignant neoplasm of lower lobe, right bronchus or lung: Secondary | ICD-10-CM | POA: Diagnosis not present

## 2019-11-04 DIAGNOSIS — C7951 Secondary malignant neoplasm of bone: Secondary | ICD-10-CM | POA: Diagnosis not present

## 2019-11-04 DIAGNOSIS — R5383 Other fatigue: Secondary | ICD-10-CM | POA: Diagnosis not present

## 2019-11-04 DIAGNOSIS — Z7689 Persons encountering health services in other specified circumstances: Secondary | ICD-10-CM | POA: Diagnosis not present

## 2019-11-04 DIAGNOSIS — Z5111 Encounter for antineoplastic chemotherapy: Secondary | ICD-10-CM | POA: Diagnosis not present

## 2019-11-04 DIAGNOSIS — R197 Diarrhea, unspecified: Secondary | ICD-10-CM | POA: Diagnosis not present

## 2019-11-04 DIAGNOSIS — Z5112 Encounter for antineoplastic immunotherapy: Secondary | ICD-10-CM | POA: Diagnosis not present

## 2019-11-04 MED ORDER — SODIUM CHLORIDE 0.9 % IV SOLN
Freq: Once | INTRAVENOUS | Status: AC
  Filled 2019-11-04: qty 250

## 2019-11-04 MED ORDER — SODIUM CHLORIDE 0.9% FLUSH
10.0000 mL | INTRAVENOUS | Status: DC | PRN
  Administered 2019-11-04: 10 mL
  Filled 2019-11-04: qty 10

## 2019-11-04 MED ORDER — SODIUM CHLORIDE 0.9 % IV SOLN
100.0000 mg/m2 | Freq: Once | INTRAVENOUS | Status: AC
  Administered 2019-11-04: 200 mg via INTRAVENOUS
  Filled 2019-11-04: qty 10

## 2019-11-04 MED ORDER — SODIUM CHLORIDE 0.9 % IV SOLN
10.0000 mg | Freq: Once | INTRAVENOUS | Status: AC
  Administered 2019-11-04: 10 mg via INTRAVENOUS
  Filled 2019-11-04: qty 10

## 2019-11-04 MED ORDER — HEPARIN SOD (PORK) LOCK FLUSH 100 UNIT/ML IV SOLN
500.0000 [IU] | Freq: Once | INTRAVENOUS | Status: AC | PRN
  Administered 2019-11-04: 10:00:00 500 [IU]
  Filled 2019-11-04: qty 5

## 2019-11-04 NOTE — Patient Instructions (Signed)
[facility] Discharge Instructions for Patients Receiving Chemotherapy  Today you received the following chemotherapy agents: Etoposide  To help prevent nausea and vomiting after your treatment, we encourage you to take your nausea medication as directed.   If you develop nausea and vomiting that is not controlled by your nausea medication, call the clinic.   BELOW ARE SYMPTOMS THAT SHOULD BE REPORTED IMMEDIATELY:  *FEVER GREATER THAN 100.5 F  *CHILLS WITH OR WITHOUT FEVER  NAUSEA AND VOMITING THAT IS NOT CONTROLLED WITH YOUR NAUSEA MEDICATION  *UNUSUAL SHORTNESS OF BREATH  *UNUSUAL BRUISING OR BLEEDING  TENDERNESS IN MOUTH AND THROAT WITH OR WITHOUT PRESENCE OF ULCERS  *URINARY PROBLEMS  *BOWEL PROBLEMS  UNUSUAL RASH Items with * indicate a potential emergency and should be followed up as soon as possible.  Feel free to call the clinic should you have any questions or concerns. The clinic phone number is [phone].  Please show the [facility] at check-in to the Emergency Department and triage nurse.

## 2019-11-05 ENCOUNTER — Other Ambulatory Visit: Payer: Self-pay

## 2019-11-05 ENCOUNTER — Inpatient Hospital Stay: Payer: Medicare PPO

## 2019-11-05 VITALS — BP 144/94 | HR 72 | Temp 98.2°F | Resp 18

## 2019-11-05 DIAGNOSIS — C7931 Secondary malignant neoplasm of brain: Secondary | ICD-10-CM | POA: Diagnosis not present

## 2019-11-05 DIAGNOSIS — Z5112 Encounter for antineoplastic immunotherapy: Secondary | ICD-10-CM | POA: Diagnosis not present

## 2019-11-05 DIAGNOSIS — C3431 Malignant neoplasm of lower lobe, right bronchus or lung: Secondary | ICD-10-CM

## 2019-11-05 DIAGNOSIS — Z7689 Persons encountering health services in other specified circumstances: Secondary | ICD-10-CM | POA: Diagnosis not present

## 2019-11-05 DIAGNOSIS — Z5111 Encounter for antineoplastic chemotherapy: Secondary | ICD-10-CM | POA: Diagnosis not present

## 2019-11-05 DIAGNOSIS — C7951 Secondary malignant neoplasm of bone: Secondary | ICD-10-CM | POA: Diagnosis not present

## 2019-11-05 DIAGNOSIS — R197 Diarrhea, unspecified: Secondary | ICD-10-CM | POA: Diagnosis not present

## 2019-11-05 DIAGNOSIS — R5383 Other fatigue: Secondary | ICD-10-CM | POA: Diagnosis not present

## 2019-11-05 DIAGNOSIS — R634 Abnormal weight loss: Secondary | ICD-10-CM | POA: Diagnosis not present

## 2019-11-05 MED ORDER — SODIUM CHLORIDE 0.9% FLUSH
10.0000 mL | INTRAVENOUS | Status: DC | PRN
  Administered 2019-11-05: 10 mL
  Filled 2019-11-05: qty 10

## 2019-11-05 MED ORDER — SODIUM CHLORIDE 0.9 % IV SOLN
Freq: Once | INTRAVENOUS | Status: AC
  Filled 2019-11-05: qty 250

## 2019-11-05 MED ORDER — SODIUM CHLORIDE 0.9 % IV SOLN
100.0000 mg/m2 | Freq: Once | INTRAVENOUS | Status: AC
  Administered 2019-11-05: 10:00:00 200 mg via INTRAVENOUS
  Filled 2019-11-05: qty 10

## 2019-11-05 MED ORDER — PEGFILGRASTIM 6 MG/0.6ML ~~LOC~~ PSKT
6.0000 mg | PREFILLED_SYRINGE | Freq: Once | SUBCUTANEOUS | Status: AC
  Administered 2019-11-05: 11:00:00 6 mg via SUBCUTANEOUS

## 2019-11-05 MED ORDER — HEPARIN SOD (PORK) LOCK FLUSH 100 UNIT/ML IV SOLN
500.0000 [IU] | Freq: Once | INTRAVENOUS | Status: AC | PRN
  Administered 2019-11-05: 500 [IU]
  Filled 2019-11-05: qty 5

## 2019-11-05 MED ORDER — PEGFILGRASTIM 6 MG/0.6ML ~~LOC~~ PSKT
PREFILLED_SYRINGE | SUBCUTANEOUS | Status: AC
  Filled 2019-11-05: qty 0.6

## 2019-11-05 MED ORDER — SODIUM CHLORIDE 0.9 % IV SOLN
10.0000 mg | Freq: Once | INTRAVENOUS | Status: AC
  Administered 2019-11-05: 10 mg via INTRAVENOUS
  Filled 2019-11-05: qty 10

## 2019-11-05 NOTE — Progress Notes (Signed)
[facility] OFFICE PROGRESS NOTE  Rutherford Guys, MD [address] [name] [address]  DIAGNOSIS: Metastatic small cell lung cancer initially diagnosed as Limited stage (T2b, N2,M0) small cell lung cancer, pending further staging work-up diagnosed and January 2020. She presented with right lower lobe lung mass in addition to right hilar and mediastinal lymphadenopathy.  She has evidence of metastatic disease to the brain in March 2021.  PRIOR THERAPY: 1) Systemic chemotherapy concurrent with radiation.  Chemotherapy withcisplatin 80 mg/M2 on day 1 and etoposide 100 mg/M2 on days 1, 2 and 3 every 3 weeks.Last dose 12/04/18. Status post5cycles.Dose reducedstarting fromcycle #4to cisplatin 40 mg/m2 and etoposide 80 mg/m2 due to renal insufficiency. 2) whole brain irradiation under the care of Dr. Sondra Come. Last treatment 10/21/19  CURRENT THERAPY: Systemic chemotherapy with carboplatin for AUC of 5 on day 1, etoposide 100 mg/M2 on days 1, 2 and 3 with Neulasta support in addition to Imfinzi 1500 mg IV every 3 weeks during chemotherapy. First dose on 11/03/19. Status post 1 cycle.   INTERVAL HISTORY: [name] 69 y.o. female returns to the clinic for a follow up visit accompanied by her significant other, Dominica Severin. She recently was found to have extensive stage disease after she presented to the ER in March 2021 and was found to have brain metastases. She subsequently completed whole brain irradiation under the care of Dr. Sondra Come earlier this month. She feels unclear about her newly diagnosed brain metastases and request a visit with neuro oncologist, Dr. Mickeal Skinner, which we will help facilitate that appointment. She also will follow up with Dr. Sondra Come on 5/10. She started systemic chemotherapy last week with carboplatin, etoposide, and Imfinzi and she tolerated it well except for some nausea and feeling unwell after returning home from treatment. She denies any fevers, chills,  or night sweats. She lost 7 lbs since her last appointment. She does not like the taste of supplemental drinks such as boost/ensure. Her significant other cooks for her. She feels like her appetite is diminished compared to prior.  She denies any chest pain, shortness of breath, cough, or hemoptyosis. She denies constipation but reports daily diarrhea with 2 loose stools. Denies any blood in the stool. She denies any abnormal bleeding or bruising including epistaxis, gingival bleeding, hematuria, or melena.  She has not tried any anti-diarrheal pills at this time. She is taping off her decadron. She did lose her balance in the shower and fell. She has a shower chair and states she has the assistive devices she needs at this time. She denies any rashes or skin changes. She is here today for evaluation and a 1 week follow up visit after completing her first cycle of chemotherapy.    MEDICAL HISTORY: Past Medical History:  Diagnosis Date  . Arthritis   . Chronic pain   . Hyperlipidemia   . Hypertension   . Low blood magnesium 10/04/2018  . lung ca dx'd 06/2018  . Stroke ([place])    06/19/2018 minor    ALLERGIES:  has No Known Allergies.  MEDICATIONS:  Current Outpatient Medications  Medication Sig Dispense Refill  . traMADol (ULTRAM) 50 MG tablet Take 1-2 tablets (50-100 mg total) by mouth at bedtime as needed. 30 tablet 1  . acetaminophen (TYLENOL) 650 MG CR tablet Take 650 mg by mouth every 8 (eight) hours as needed for pain.    Marland Kitchen amLODipine (NORVASC) 10 MG tablet Take 1 tablet (10 mg total) by mouth daily as needed.    Marland Kitchen  atorvastatin (LIPITOR) 80 MG tablet TAKE 1 TABLET (80 MG TOTAL) BY MOUTH DAILY AT 6 PM. 90 tablet 3  . Clobetasol Prop Emollient Base (CLOBETASOL PROPIONATE E) 0.05 % emollient cream Apply 1 application topically 2 (two) times daily. (Patient not taking: Reported on 10/27/2019) 60 g 3  . dexamethasone (DECADRON) 4 MG tablet Take 1 tablet (4 mg total) by mouth daily. (Patient not  taking: Reported on 11/10/2019) 30 tablet 0  . DULoxetine (CYMBALTA) 30 MG capsule TAKE 1 CAPSULE BY MOUTH EVERY DAY (Patient taking differently: Take 30 mg by mouth daily. ) 90 capsule 2  . lisinopril (ZESTRIL) 40 MG tablet Take 1 tablet (40 mg total) by mouth daily. (Patient not taking: Reported on 11/10/2019) 90 tablet 0  . ondansetron (ZOFRAN) 4 MG tablet Take 1 tablet (4 mg total) by mouth every 8 (eight) hours as needed for nausea or vomiting. 20 tablet 3  . pregabalin (LYRICA) 50 MG capsule TAKE 1 CAPSULE 3 TIMES A DAY 90 capsule 2  . prochlorperazine (COMPAZINE) 10 MG tablet Take 1 tablet (10 mg total) by mouth every 6 (six) hours as needed for nausea or vomiting. 30 tablet 0  . rivaroxaban (XARELTO) 20 MG TABS tablet Take 1 tablet (20 mg total) by mouth daily with supper. 30 tablet 2   No current facility-administered medications for this visit.    SURGICAL HISTORY:  Past Surgical History:  Procedure Laterality Date  . BIOPSY  06/19/2018   Procedure: BIOPSY;  Surgeon: Laurence Spates, MD;  Location: [facility];  Service: Endoscopy;;  . BIOPSY  10/11/2018   Procedure: BIOPSY;  Surgeon: Laurence Spates, MD;  Location: [facility];  Service: Endoscopy;;  . ENDARTERECTOMY Right 06/26/2018   Procedure: ENDARTERECTOMY CAROTID RIGHT;  Surgeon: Serafina Mitchell, MD;  Location: [facility] OR;  Service: Vascular;  Laterality: Right;  . ESOPHAGOGASTRODUODENOSCOPY (EGD) WITH PROPOFOL N/A 06/19/2018   Procedure: ESOPHAGOGASTRODUODENOSCOPY (EGD) WITH PROPOFOL;  Surgeon: Laurence Spates, MD;  Location: [facility];  Service: Endoscopy;  Laterality: N/A;  . FLEXIBLE SIGMOIDOSCOPY N/A 10/11/2018   Procedure: FLEXIBLE SIGMOIDOSCOPY;  Surgeon: Laurence Spates, MD;  Location: [facility];  Service: Endoscopy;  Laterality: N/A;  . IR IMAGING GUIDED PORT INSERTION  10/07/2018  . PATCH ANGIOPLASTY Right 06/26/2018   Procedure: PATCH ANGIOPLASTY USING A XENOSURE 1cm x 6cm BIOLOGIC PATCH;  Surgeon: Serafina Mitchell, MD;   Location: [facility];  Service: Vascular;  Laterality: Right;  Marland Kitchen VIDEO BRONCHOSCOPY WITH ENDOBRONCHIAL ULTRASOUND N/A 08/14/2018   Procedure: VIDEO BRONCHOSCOPY WITH ENDOBRONCHIAL ULTRASOUND;  Surgeon: Collene Gobble, MD;  Location: [facility] OR;  Service: Thoracic;  Laterality: N/A;    REVIEW OF SYSTEMS:   Review of Systems  Constitutional: Positive for fatigue, decreased appetite, and weight loss.  Negative for chills and fever HENT: Negative for mouth sores, nosebleeds, sore throat and trouble swallowing.   Eyes: Negative for eye problems and icterus.  Respiratory: Negative for cough, hemoptysis, shortness of breath and wheezing.  Cardiovascular: Negative for chest pain and leg swelling.  Gastrointestinal: Positive for mild nausea and 2 loose stools per day. negative for abdominal pain, constipation, and vomiting.  Genitourinary: Negative for bladder incontinence, difficulty urinating, dysuria, frequency and hematuria.   Musculoskeletal: Negative for back pain, gait problem, neck pain and neck stiffness.  Skin: Negative for itching and rash.  Neurological: Negative for dizziness, extremity weakness, gait problem, headaches, light-headedness and seizures.  Hematological: Negative for adenopathy. Does not bruise/bleed easily.  Psychiatric/Behavioral: Negative for confusion, depression and sleep disturbance. The patient  is not nervous/anxious.     PHYSICAL EXAMINATION:  Blood pressure (!) 133/102, pulse (!) 20, temperature 98.3 F (36.8 C), resp. rate 20, height 5\' 6"  (1.676 m), weight 183 lb 1.6 oz (83.1 kg), SpO2 99 %.  ECOG PERFORMANCE STATUS: 1 - Symptomatic but completely ambulatory  Physical Exam  Constitutional: Oriented to person, place, and time and well-developed, well-nourished, and in no distress.  HENT:  Head: Normocephalic and atraumatic.  Mouth/Throat: Oropharynx is clear and moist. No oropharyngeal exudate.  Eyes: Conjunctivae are normal. Right eye exhibits no discharge. Left eye  exhibits no discharge. No scleral icterus.  Neck: Normal range of motion. Neck supple.  Cardiovascular: Normal rate, regular rhythm, normal heart sounds and intact distal pulses.   Pulmonary/Chest: Effort normal and breath sounds normal. No respiratory distress. No wheezes. No rales.  Abdominal: Soft. Bowel sounds are normal. Exhibits no distension and no mass. There is no tenderness.  Musculoskeletal: Normal range of motion. Exhibits no edema.  Lymphadenopathy:    No cervical adenopathy.  Neurological: Alert and oriented to person, place, and time. Exhibits normal muscle tone. Gait normal. Coordination normal.  Skin: Skin is warm and dry. No rash noted. Not diaphoretic. No erythema. No pallor.  Psychiatric: Mood, memory and judgment normal.  Vitals reviewed.  LABORATORY DATA: Lab Results  Component Value Date   WBC 0.7 (LL) 11/10/2019   HGB 10.0 (L) 11/10/2019   HCT 31.6 (L) 11/10/2019   MCV 102.3 (H) 11/10/2019   PLT 38 (L) 11/10/2019      Chemistry      Component Value Date/Time   NA 142 11/10/2019 0915   NA 141 10/13/2019 1558   K 4.1 11/10/2019 0915   CL 109 11/10/2019 0915   CO2 25 11/10/2019 0915   BUN 30 (H) 11/10/2019 0915   BUN 26 10/13/2019 1558   CREATININE 1.21 (H) 11/10/2019 0915      Component Value Date/Time   CALCIUM 8.8 (L) 11/10/2019 0915   ALKPHOS 63 11/10/2019 0915   AST 15 11/10/2019 0915   ALT 23 11/10/2019 0915   BILITOT 0.9 11/10/2019 0915       RADIOGRAPHIC STUDIES:  CT Chest W Contrast  Result Date: 10/24/2019 CLINICAL DATA:  Limited stage right small cell lung cancer status post radiation therapy and chemotherapy. Restaging. EXAM: CT CHEST WITH CONTRAST TECHNIQUE: Multidetector CT imaging of the chest was performed during intravenous contrast administration. CONTRAST:  41mL OMNIPAQUE IOHEXOL 300 MG/ML  SOLN COMPARISON:  08/09/2019 chest CT. FINDINGS: Cardiovascular: Normal heart size. No significant pericardial effusion/thickening.  Three-vessel coronary atherosclerosis. Right internal jugular Port-A-Cath terminates at the cavoatrial junction. Atherosclerotic nonaneurysmal thoracic aorta. Normal caliber pulmonary arteries. No central pulmonary emboli. Mediastinum/Nodes: No discrete thyroid nodules. Unremarkable esophagus. No axillary adenopathy. Enlarged 3.4 cm static cyst diameter right paratracheal node (series 2/image 45), previously 3.5 cm using similar measurement technique, stable. Enlarged 1.4 cm subcarinal node (series 2/image 58), previously 1.4 cm, stable. No new pathologically enlarged mediastinal nodes. Enlarged 1.1 cm right hilar node (series 2/image 61), stable. No left hilar adenopathy. Lungs/Pleura: No pneumothorax. Trace dependent right pleural effusion, decreased. No left pleural effusion. Anterior right lower lobe 2.8 x 2.2 cm solid pulmonary nodule (series 5/image 77), previously 2.7 x 2.1 cm, not appreciably changed. Sharply marginated patchy and bandlike right perihilar and right mid lung consolidation with associated volume loss and distortion, compatible with evolving postradiation change. A few scattered small solid pulmonary nodules in the basilar right upper lobe, largest 0.4 cm (series 5/image 57),  stable. Solid subpleural 0.4 cm posterior left lower lobe nodule (series 5/image 86), stable. No new significant pulmonary nodules. Upper abdomen: Stable 1.3 cm left adrenal nodule, unchanged since 08/30/2018 PET-CT, where it was non FDG avid, compatible with a benign adenoma. Musculoskeletal: Sclerotic lesions in the posterior elements on the right at T5 and T6, mildly increased in density. New mild patchy sclerosis in the T6 vertebral body with new pathologic mild T6 vertebral fracture. Mild thoracic spondylosis. IMPRESSION: 1. Anterior right lower lobe 2.8 cm solid pulmonary nodule, not substantially changed. Expected evolution of postradiation change in the right lung. 2. Stable right hilar and mediastinal  lymphadenopathy. 3. Sclerotic osseous metastases in the posterior elements on the right at T5 and T6, mildly increased in density. New mild patchy sclerosis in the T6 vertebral body with new pathologic mild T6 vertebral fracture. These findings are equivocal for evolving treatment response within previously occult bone metastasis versus progression of sclerotic osseous metastatic disease. 4. Trace dependent right pleural effusion, decreased. 5. Three-vessel coronary atherosclerosis. 6. Stable left adrenal adenoma. 7. Aortic Atherosclerosis (ICD10-I70.0). Electronically Signed   By: Ilona Sorrel M.D.   On: 10/24/2019 13:54     ASSESSMENT/PLAN:  This is a very pleasant 69 year old Caucasian female who was initially diagnosed with limited small cell lung cancer in January 2020. She initially presented with a right lower lobe lung mass status post 5 cycles of systemic chemotherapy with Cisplatin and Etoposide. Her dose was reduced starting from cycle #4 due to renal insuffiencey. She also had concurrent radiation.   She was found to have extensive stage disease in March 2021 with the development of metastatic disease to the brain. This was treated with whole brain irradiation under the care of Dr. Sondra Come which was completed in April 2021.   She is currently undergoing treatment with palliative systemic chemotherapy with carboplatin for AUC of 5 on day 1, etoposide 100 mg/M2 on days 1, 2 and 3 with Neulasta support in addition to Imfinzi 1500 mg IV every 3 weeks. She is status post 1 cycle and tolerated it fairly well except for lab abnormalities.    Labs were reviewed.  She is neutropenic today with a WBC count of 0.7 and ANC of 0.3.  She denies any signs or symptoms of infection at this time.  I reviewed neutropenic precautions with the patient.  She knows to contact us with any signs and symptoms of infection.  She received her injection with on pro last week.  Her platelet count is low at 38k today as  well.  She denies any abnormal bleeding or bruising.  She is currently prescribed Xarelto for DVT in January 2021.   The patient was instructed to hold her Xarelto until her labs can be rechecked next week.  She was also advised to call us or seek emergency room evaluation if she develops any significant bleeding in the interval.  Recommend that she continue on the same treatment at the same dose.   We will see her back for a follow up visit in 2 weeks for evaluation before starting cycle #2.   We will arrange for the patient to have an appointment with Dr. Mickeal Skinner, neuro oncologist, regarding her recent brain metastases.  She will continue to taper her Decadron as instructed by radiation oncology. She has a follow up appointment with radiation oncology on 5/10.  Regarding the patient's weight loss, she is not interested in speaking with a member of the nutritionist team at this  time.  She will continue to try and increase her caloric intake.   The patient expressed a challenging time adjusting with her recent evidence of disease recurrence. She expressed feelings of abandonment and being unclear about her condition. I tried to answer her questions to the best of my ability. I will reach out to the chaplin or counselor to reach out to the patient to see if they can provide support.   Regarding her falls, the patient states that she does not need any other assistive devices at this time. She has a Programmer, multimedia. She has a shower chair at home. Advised her to call us if she needs anything in the future.   She was encouraged to use imodium if needed for diarrhea. Advised to call if she develops significant diarrhea (>5-6x per day) and little to no improvement with imodium. Advised to call with fevers, abdominal pain, or melena.   The patient was advised to call immediately if she has any concerning symptoms in the interval. The patient voices understanding of current disease status and treatment options and  is in agreement with the current care plan. All questions were answered. The patient knows to call the clinic with any problems, questions or concerns. We can certainly see the patient much sooner if necessary    Orders Placed This Encounter  Procedures  . Shower chair     Allanah Mcfarland L Kala Gassmann, PA-C 11/10/19

## 2019-11-05 NOTE — Patient Instructions (Signed)
[facility] Discharge Instructions for Patients Receiving Chemotherapy  Today you received the following chemotherapy agents: etoposide (vepeside) and pegfilgrastim (neulasta onpro)  To help prevent nausea and vomiting after your treatment, we encourage you to take your nausea medication as directed.   If you develop nausea and vomiting that is not controlled by your nausea medication, call the clinic.   BELOW ARE SYMPTOMS THAT SHOULD BE REPORTED IMMEDIATELY:  *FEVER GREATER THAN 100.5 F  *CHILLS WITH OR WITHOUT FEVER  NAUSEA AND VOMITING THAT IS NOT CONTROLLED WITH YOUR NAUSEA MEDICATION  *UNUSUAL SHORTNESS OF BREATH  *UNUSUAL BRUISING OR BLEEDING  TENDERNESS IN MOUTH AND THROAT WITH OR WITHOUT PRESENCE OF ULCERS  *URINARY PROBLEMS  *BOWEL PROBLEMS  UNUSUAL RASH Items with * indicate a potential emergency and should be followed up as soon as possible.  Feel free to call the clinic should you have any questions or concerns. The clinic phone number is [phone].  Please show the [facility] at check-in to the Emergency Department and triage nurse.

## 2019-11-06 ENCOUNTER — Telehealth: Payer: Self-pay | Admitting: *Deleted

## 2019-11-06 NOTE — Telephone Encounter (Signed)
Called and left message on vm to set up appt with Dr. Mickeal Skinner per Cassandra Heilingoetter, PA's request.  Pending call back

## 2019-11-07 ENCOUNTER — Inpatient Hospital Stay: Payer: Medicare PPO

## 2019-11-10 ENCOUNTER — Telehealth: Payer: Self-pay | Admitting: *Deleted

## 2019-11-10 ENCOUNTER — Other Ambulatory Visit: Payer: Self-pay

## 2019-11-10 ENCOUNTER — Inpatient Hospital Stay: Payer: Medicare PPO

## 2019-11-10 ENCOUNTER — Other Ambulatory Visit: Payer: Self-pay | Admitting: Physician Assistant

## 2019-11-10 ENCOUNTER — Encounter: Payer: Self-pay | Admitting: Physician Assistant

## 2019-11-10 ENCOUNTER — Inpatient Hospital Stay (HOSPITAL_BASED_OUTPATIENT_CLINIC_OR_DEPARTMENT_OTHER): Payer: Medicare PPO | Admitting: Physician Assistant

## 2019-11-10 VITALS — BP 133/102 | HR 20 | Temp 98.3°F | Resp 20 | Ht 66.0 in | Wt 183.1 lb

## 2019-11-10 DIAGNOSIS — C7951 Secondary malignant neoplasm of bone: Secondary | ICD-10-CM | POA: Diagnosis not present

## 2019-11-10 DIAGNOSIS — Z95828 Presence of other vascular implants and grafts: Secondary | ICD-10-CM

## 2019-11-10 DIAGNOSIS — R634 Abnormal weight loss: Secondary | ICD-10-CM | POA: Diagnosis not present

## 2019-11-10 DIAGNOSIS — Z5111 Encounter for antineoplastic chemotherapy: Secondary | ICD-10-CM | POA: Diagnosis not present

## 2019-11-10 DIAGNOSIS — R197 Diarrhea, unspecified: Secondary | ICD-10-CM | POA: Diagnosis not present

## 2019-11-10 DIAGNOSIS — C3431 Malignant neoplasm of lower lobe, right bronchus or lung: Secondary | ICD-10-CM | POA: Diagnosis not present

## 2019-11-10 DIAGNOSIS — R5383 Other fatigue: Secondary | ICD-10-CM | POA: Diagnosis not present

## 2019-11-10 DIAGNOSIS — D702 Other drug-induced agranulocytosis: Secondary | ICD-10-CM | POA: Diagnosis not present

## 2019-11-10 DIAGNOSIS — Z5112 Encounter for antineoplastic immunotherapy: Secondary | ICD-10-CM | POA: Diagnosis not present

## 2019-11-10 DIAGNOSIS — C7931 Secondary malignant neoplasm of brain: Secondary | ICD-10-CM | POA: Diagnosis not present

## 2019-11-10 DIAGNOSIS — Z7689 Persons encountering health services in other specified circumstances: Secondary | ICD-10-CM | POA: Diagnosis not present

## 2019-11-10 LAB — CMP (CANCER CENTER ONLY)
ALT: 23 U/L (ref 0–44)
AST: 15 U/L (ref 15–41)
Albumin: 3.3 g/dL — ABNORMAL LOW (ref 3.5–5.0)
Alkaline Phosphatase: 63 U/L (ref 38–126)
Anion gap: 8 (ref 5–15)
BUN: 30 mg/dL — ABNORMAL HIGH (ref 8–23)
CO2: 25 mmol/L (ref 22–32)
Calcium: 8.8 mg/dL — ABNORMAL LOW (ref 8.9–10.3)
Chloride: 109 mmol/L (ref 98–111)
Creatinine: 1.21 mg/dL — ABNORMAL HIGH (ref 0.44–1.00)
GFR, Est AFR Am: 54 mL/min — ABNORMAL LOW (ref >60)
GFR, Estimated: 46 mL/min — ABNORMAL LOW (ref >60)
Glucose, Bld: 105 mg/dL — ABNORMAL HIGH (ref 70–99)
Potassium: 4.1 mmol/L (ref 3.5–5.1)
Sodium: 142 mmol/L (ref 135–145)
Total Bilirubin: 0.9 mg/dL (ref 0.3–1.2)
Total Protein: 6.1 g/dL — ABNORMAL LOW (ref 6.5–8.1)

## 2019-11-10 LAB — CBC WITH DIFFERENTIAL (CANCER CENTER ONLY)
Abs Immature Granulocytes: 0 10*3/uL (ref 0.00–0.07)
Band Neutrophils: 1 %
Basophils Absolute: 0 10*3/uL (ref 0.0–0.1)
Basophils Relative: 0 %
Eosinophils Absolute: 0 10*3/uL (ref 0.0–0.5)
Eosinophils Relative: 1 %
HCT: 31.6 % — ABNORMAL LOW (ref 36.0–46.0)
Hemoglobin: 10 g/dL — ABNORMAL LOW (ref 12.0–15.0)
Lymphocytes Relative: 55 %
Lymphs Abs: 0.4 10*3/uL — ABNORMAL LOW (ref 0.7–4.0)
MCH: 32.4 pg (ref 26.0–34.0)
MCHC: 31.6 g/dL (ref 30.0–36.0)
MCV: 102.3 fL — ABNORMAL HIGH (ref 80.0–100.0)
Monocytes Absolute: 0 10*3/uL — ABNORMAL LOW (ref 0.1–1.0)
Monocytes Relative: 3 %
Neutro Abs: 0.3 10*3/uL — CL (ref 1.7–7.7)
Neutrophils Relative %: 40 %
Platelet Count: 38 10*3/uL — ABNORMAL LOW (ref 150–400)
RBC: 3.09 MIL/uL — ABNORMAL LOW (ref 3.87–5.11)
RDW: 14 % (ref 11.5–15.5)
WBC Count: 0.7 10*3/uL — CL (ref 4.0–10.5)
nRBC: 0 % (ref 0.0–0.2)

## 2019-11-10 MED ORDER — SODIUM CHLORIDE 0.9% FLUSH
10.0000 mL | INTRAVENOUS | Status: DC | PRN
  Administered 2019-11-10: 10 mL
  Filled 2019-11-10: qty 10

## 2019-11-10 MED ORDER — HEPARIN SOD (PORK) LOCK FLUSH 100 UNIT/ML IV SOLN
500.0000 [IU] | Freq: Once | INTRAVENOUS | Status: AC | PRN
  Administered 2019-11-10: 500 [IU]
  Filled 2019-11-10: qty 5

## 2019-11-10 NOTE — Telephone Encounter (Signed)
Patient scheduled for visit with Dr. Mickeal Skinner due to recent brain mets with some balance issues.

## 2019-11-10 NOTE — Telephone Encounter (Signed)
Received call requesting fax of insurance info, height and weight from [place] [facility] [employer] to process shower chair order.  Faxed to [phone]

## 2019-11-10 NOTE — Telephone Encounter (Signed)
Per provider order for shower chair canceled.  Patient has one already at home.

## 2019-11-11 ENCOUNTER — Encounter: Payer: Self-pay | Admitting: General Practice

## 2019-11-11 NOTE — Progress Notes (Signed)
[facility] Spiritual Care Note  Phoned Ms Sipp per referral from [facility] Heilingoetter/PA for emotional support. Ms Fuerstenberg was not feeling well and asked her caregiver to take my number so that she could call me at a better time. Briefly introduced Spiritual Care as part of their broader Support Team and will await return call.   [place], [place], [facility] Pager [phone] Voicemail [phone]

## 2019-11-17 ENCOUNTER — Other Ambulatory Visit: Payer: Self-pay | Admitting: Emergency Medicine

## 2019-11-17 ENCOUNTER — Other Ambulatory Visit: Payer: Self-pay

## 2019-11-17 ENCOUNTER — Inpatient Hospital Stay: Payer: Medicare PPO | Attending: Internal Medicine

## 2019-11-17 ENCOUNTER — Inpatient Hospital Stay: Payer: Medicare PPO

## 2019-11-17 ENCOUNTER — Inpatient Hospital Stay: Payer: Medicare PPO | Admitting: Internal Medicine

## 2019-11-17 ENCOUNTER — Other Ambulatory Visit: Payer: Self-pay | Admitting: Medical Oncology

## 2019-11-17 VITALS — BP 167/100 | HR 90 | Temp 98.3°F | Resp 18

## 2019-11-17 VITALS — BP 116/82 | HR 102 | Temp 98.0°F | Resp 18 | Ht 66.0 in | Wt 186.2 lb

## 2019-11-17 DIAGNOSIS — J029 Acute pharyngitis, unspecified: Secondary | ICD-10-CM | POA: Diagnosis not present

## 2019-11-17 DIAGNOSIS — Z5111 Encounter for antineoplastic chemotherapy: Secondary | ICD-10-CM | POA: Diagnosis not present

## 2019-11-17 DIAGNOSIS — Z79899 Other long term (current) drug therapy: Secondary | ICD-10-CM | POA: Diagnosis not present

## 2019-11-17 DIAGNOSIS — I2510 Atherosclerotic heart disease of native coronary artery without angina pectoris: Secondary | ICD-10-CM | POA: Diagnosis not present

## 2019-11-17 DIAGNOSIS — C7931 Secondary malignant neoplasm of brain: Secondary | ICD-10-CM | POA: Diagnosis not present

## 2019-11-17 DIAGNOSIS — Z7901 Long term (current) use of anticoagulants: Secondary | ICD-10-CM | POA: Insufficient documentation

## 2019-11-17 DIAGNOSIS — Z8673 Personal history of transient ischemic attack (TIA), and cerebral infarction without residual deficits: Secondary | ICD-10-CM | POA: Insufficient documentation

## 2019-11-17 DIAGNOSIS — T451X5A Adverse effect of antineoplastic and immunosuppressive drugs, initial encounter: Secondary | ICD-10-CM

## 2019-11-17 DIAGNOSIS — Z7689 Persons encountering health services in other specified circumstances: Secondary | ICD-10-CM | POA: Insufficient documentation

## 2019-11-17 DIAGNOSIS — E785 Hyperlipidemia, unspecified: Secondary | ICD-10-CM | POA: Insufficient documentation

## 2019-11-17 DIAGNOSIS — R42 Dizziness and giddiness: Secondary | ICD-10-CM | POA: Insufficient documentation

## 2019-11-17 DIAGNOSIS — R112 Nausea with vomiting, unspecified: Secondary | ICD-10-CM | POA: Insufficient documentation

## 2019-11-17 DIAGNOSIS — C3431 Malignant neoplasm of lower lobe, right bronchus or lung: Secondary | ICD-10-CM

## 2019-11-17 DIAGNOSIS — Z87891 Personal history of nicotine dependence: Secondary | ICD-10-CM | POA: Diagnosis not present

## 2019-11-17 DIAGNOSIS — M1990 Unspecified osteoarthritis, unspecified site: Secondary | ICD-10-CM | POA: Insufficient documentation

## 2019-11-17 DIAGNOSIS — D649 Anemia, unspecified: Secondary | ICD-10-CM

## 2019-11-17 DIAGNOSIS — N289 Disorder of kidney and ureter, unspecified: Secondary | ICD-10-CM | POA: Diagnosis not present

## 2019-11-17 DIAGNOSIS — D61810 Antineoplastic chemotherapy induced pancytopenia: Secondary | ICD-10-CM

## 2019-11-17 DIAGNOSIS — Z95828 Presence of other vascular implants and grafts: Secondary | ICD-10-CM

## 2019-11-17 DIAGNOSIS — Z5112 Encounter for antineoplastic immunotherapy: Secondary | ICD-10-CM | POA: Insufficient documentation

## 2019-11-17 DIAGNOSIS — D61818 Other pancytopenia: Secondary | ICD-10-CM | POA: Diagnosis not present

## 2019-11-17 DIAGNOSIS — I10 Essential (primary) hypertension: Secondary | ICD-10-CM | POA: Diagnosis not present

## 2019-11-17 LAB — CBC WITH DIFFERENTIAL (CANCER CENTER ONLY)
Abs Immature Granulocytes: 0.28 10*3/uL — ABNORMAL HIGH (ref 0.00–0.07)
Basophils Absolute: 0 10*3/uL (ref 0.0–0.1)
Basophils Relative: 2 %
Eosinophils Absolute: 0 10*3/uL (ref 0.0–0.5)
Eosinophils Relative: 0 %
HCT: 22 % — ABNORMAL LOW (ref 36.0–46.0)
Hemoglobin: 7.2 g/dL — ABNORMAL LOW (ref 12.0–15.0)
Immature Granulocytes: 11 %
Lymphocytes Relative: 23 %
Lymphs Abs: 0.6 10*3/uL — ABNORMAL LOW (ref 0.7–4.0)
MCH: 31.9 pg (ref 26.0–34.0)
MCHC: 32.7 g/dL (ref 30.0–36.0)
MCV: 97.3 fL (ref 80.0–100.0)
Monocytes Absolute: 0.3 10*3/uL (ref 0.1–1.0)
Monocytes Relative: 11 %
Neutro Abs: 1.3 10*3/uL — ABNORMAL LOW (ref 1.7–7.7)
Neutrophils Relative %: 53 %
Platelet Count: <5 10*3/uL — CL (ref 150–400)
RBC: 2.26 MIL/uL — ABNORMAL LOW (ref 3.87–5.11)
RDW: 13.2 % (ref 11.5–15.5)
WBC Count: 2.5 10*3/uL — ABNORMAL LOW (ref 4.0–10.5)
nRBC: 0 % (ref 0.0–0.2)

## 2019-11-17 LAB — CMP (CANCER CENTER ONLY)
ALT: 17 U/L (ref 0–44)
AST: 14 U/L — ABNORMAL LOW (ref 15–41)
Albumin: 3 g/dL — ABNORMAL LOW (ref 3.5–5.0)
Alkaline Phosphatase: 73 U/L (ref 38–126)
Anion gap: 9 (ref 5–15)
BUN: 13 mg/dL (ref 8–23)
CO2: 25 mmol/L (ref 22–32)
Calcium: 8.5 mg/dL — ABNORMAL LOW (ref 8.9–10.3)
Chloride: 110 mmol/L (ref 98–111)
Creatinine: 1.17 mg/dL — ABNORMAL HIGH (ref 0.44–1.00)
GFR, Est AFR Am: 56 mL/min — ABNORMAL LOW (ref >60)
GFR, Estimated: 48 mL/min — ABNORMAL LOW (ref >60)
Glucose, Bld: 100 mg/dL — ABNORMAL HIGH (ref 70–99)
Potassium: 3.7 mmol/L (ref 3.5–5.1)
Sodium: 144 mmol/L (ref 135–145)
Total Bilirubin: 0.3 mg/dL (ref 0.3–1.2)
Total Protein: 5.4 g/dL — ABNORMAL LOW (ref 6.5–8.1)

## 2019-11-17 LAB — PREPARE RBC (CROSSMATCH)

## 2019-11-17 MED ORDER — DIPHENHYDRAMINE HCL 25 MG PO CAPS
25.0000 mg | ORAL_CAPSULE | Freq: Once | ORAL | Status: AC
  Administered 2019-11-17: 11:00:00 25 mg via ORAL

## 2019-11-17 MED ORDER — HEPARIN SOD (PORK) LOCK FLUSH 100 UNIT/ML IV SOLN
500.0000 [IU] | Freq: Once | INTRAVENOUS | Status: AC | PRN
  Administered 2019-11-17: 09:00:00 500 [IU]
  Filled 2019-11-17: qty 5

## 2019-11-17 MED ORDER — CLONIDINE HCL 0.1 MG PO TABS
ORAL_TABLET | ORAL | Status: AC
  Filled 2019-11-17: qty 1

## 2019-11-17 MED ORDER — DIPHENHYDRAMINE HCL 25 MG PO CAPS
ORAL_CAPSULE | ORAL | Status: AC
  Filled 2019-11-17: qty 1

## 2019-11-17 MED ORDER — CLONIDINE HCL 0.1 MG PO TABS
0.1000 mg | ORAL_TABLET | Freq: Once | ORAL | Status: AC
  Administered 2019-11-17: 0.1 mg via ORAL

## 2019-11-17 MED ORDER — SODIUM CHLORIDE 0.9% IV SOLUTION
250.0000 mL | Freq: Once | INTRAVENOUS | Status: AC
  Administered 2019-11-17: 250 mL via INTRAVENOUS
  Filled 2019-11-17: qty 250

## 2019-11-17 MED ORDER — SODIUM CHLORIDE 0.9% FLUSH
10.0000 mL | INTRAVENOUS | Status: AC | PRN
  Administered 2019-11-17: 17:00:00 10 mL
  Filled 2019-11-17: qty 10

## 2019-11-17 MED ORDER — ACETAMINOPHEN 325 MG PO TABS
ORAL_TABLET | ORAL | Status: AC
  Filled 2019-11-17: qty 2

## 2019-11-17 MED ORDER — SODIUM CHLORIDE 0.9% FLUSH
10.0000 mL | INTRAVENOUS | Status: DC | PRN
  Administered 2019-11-17: 10 mL
  Filled 2019-11-17: qty 10

## 2019-11-17 MED ORDER — HEPARIN SOD (PORK) LOCK FLUSH 100 UNIT/ML IV SOLN
500.0000 [IU] | Freq: Every day | INTRAVENOUS | Status: AC | PRN
  Administered 2019-11-17: 500 [IU]
  Filled 2019-11-17: qty 5

## 2019-11-17 MED ORDER — ACETAMINOPHEN 325 MG PO TABS
650.0000 mg | ORAL_TABLET | Freq: Once | ORAL | Status: AC
  Administered 2019-11-17: 11:00:00 650 mg via ORAL

## 2019-11-17 NOTE — Patient Instructions (Signed)
Platelet Transfusion A platelet transfusion is a procedure in which you receive donated platelets through an IV. Platelets are tiny pieces of blood cells. When you get an injury, platelets clump together in the area to form a blood clot. This helps stop bleeding and is the beginning of the healing process. If you have too few platelets, your blood may have trouble clotting. This may cause you to bleed and bruise very easily. You may need a platelet transfusion if you have a condition that causes a low number of platelets (thrombocytopenia). A platelet transfusion may be used to stop or prevent excessive bleeding. Tell a health care provider about:  Any reactions you have had during previous transfusions.  Any allergies you have.  All medicines you are taking, including vitamins, herbs, eye drops, creams, and over-the-counter medicines.  Any blood disorders you have.  Any surgeries you have had.  Any medical conditions you have.  Whether you are pregnant or may be pregnant. What are the risks? Generally, this is a safe procedure. However, problems may occur, including:  Fever.  Infection.  Allergic reaction to the donor platelets.  Your body's disease-fighting system (immune system) attacking the donor platelets (hemolytic reaction). This is rare.  A rare reaction that causes lung damage (transfusion-related acute lung injury). What happens before the procedure? Medicines  Ask your health care provider about: ? Changing or stopping your regular medicines. This is especially important if you are taking diabetes medicines or blood thinners. ? Taking medicines such as aspirin and ibuprofen. These medicines can thin your blood. Do not take these medicines unless your health care provider tells you to take them. ? Taking over-the-counter medicines, vitamins, herbs, and supplements. General instructions  You will have a blood test to determine your blood type. Your blood type  determines what kind of platelets you will be given.  Follow instructions from your health care provider about eating or drinking restrictions.  If you have had an allergic reaction to a transfusion in the past, you may be given medicine to help prevent a reaction.  Your temperature, blood pressure, pulse, and breathing will be monitored. What happens during the procedure?   An IV will be inserted into one of your veins.  For your safety, two health care providers will verify your identity along with the donor platelets about to be infused.  A bag of donor platelets will be connected to your IV. The platelets will flow into your bloodstream. This usually takes 30-60 minutes.  Your temperature, blood pressure, pulse, and breathing will be monitored during the transfusion. This helps detect early signs of any reaction.  You will also be monitored for other symptoms that may indicate a reaction, including chills, hives, or itching.  If you have signs of a reaction at any time, your transfusion will be stopped, and you may be given medicine to help manage the reaction.  When your transfusion is complete, your IV will be removed.  Pressure may be applied to the IV site for a few minutes to stop any bleeding.  The IV site will be covered with a bandage (dressing). The procedure may vary among health care providers and hospitals. What happens after the procedure?  Your blood pressure, temperature, pulse, and breathing will be monitored until you leave the hospital or clinic.  You may have some bruising and soreness at your IV site. Follow these instructions at home: Medicines  Take over-the-counter and prescription medicines only as told by your health care provider.  Talk with your health care provider before you take any medicines that contain aspirin or NSAIDs. These medicines increase your risk for dangerous bleeding. General instructions  Change or remove your dressing as told  by your health care provider.  Return to your normal activities as told by your health care provider. Ask your health care provider what activities are safe for you.  Do not take baths, swim, or use a hot tub until your health care provider approves. Ask your health care provider if you may take showers.  Check your IV site every day for signs of infection. Check for: ? Redness, swelling, or pain. ? Fluid or blood. If fluid or blood drains from your IV site, use your hands to press down firmly on a bandage covering the area for a minute or two. Doing this should stop the bleeding. ? Warmth. ? Pus or a bad smell.  Keep all follow-up visits as told by your health care provider. This is important. Contact a health care provider if you have:  A headache that does not go away with medicine.  Hives, rash, or itchy skin.  Nausea or vomiting.  Unusual tiredness or weakness.  Signs of infection at your IV site. Get help right away if:  You have a fever or chills.  You urinate less often than usual.  Your urine is darker colored than normal.  You have any of the following: ? Trouble breathing. ? Pain in your back, abdomen, or chest. ? Cool, clammy skin. ? A fast heartbeat. Summary  Platelets are tiny pieces of blood cells that clump together to form a blood clot when you have an injury. If you have too few platelets, your blood may have trouble clotting.  A platelet transfusion is a procedure in which you receive donated platelets through an IV.  A platelet transfusion may be used to stop or prevent excessive bleeding.  After the procedure, check your IV site every day for signs of infection, including redness, swelling, pain, or warmth. This information is not intended to replace advice given to you by your health care provider. Make sure you discuss any questions you have with your health care provider. Document Revised: 08/08/2017 Document Reviewed: 08/08/2017 Elsevier  Patient Education  2020 [place]. Blood Transfusion, Adult, Care After This sheet gives you information about how to care for yourself after your procedure. Your doctor may also give you more specific instructions. If you have problems or questions, contact your doctor. What can I expect after the procedure? After the procedure, it is common to have:  Bruising and soreness at the IV site.  A fever or chills on the day of the procedure. This may be your body's response to the new blood cells received.  A headache. Follow these instructions at home: Insertion site care      Follow instructions from your doctor about how to take care of your insertion site. This is where an IV tube was put into your vein. Make sure you: ? Wash your hands with soap and water before and after you change your bandage (dressing). If you cannot use soap and water, use hand sanitizer. ? Change your bandage as told by your doctor.  Check your insertion site every day for signs of infection. Check for: ? Redness, swelling, or pain. ? Bleeding from the site. ? Warmth. ? Pus or a bad smell. General instructions  Take over-the-counter and prescription medicines only as told by your doctor.  Rest as told  by your doctor.  Go back to your normal activities as told by your doctor.  Keep all follow-up visits as told by your doctor. This is important. Contact a doctor if:  You have itching or red, swollen areas of skin (hives).  You feel worried or nervous (anxious).  You feel weak after doing your normal activities.  You have redness, swelling, warmth, or pain around the insertion site.  You have blood coming from the insertion site, and the blood does not stop with pressure.  You have pus or a bad smell coming from the insertion site. Get help right away if:  You have signs of a serious reaction. This may be coming from an allergy or the body's defense system (immune system). Signs  include: ? Trouble breathing or shortness of breath. ? Swelling of the face or feeling warm (flushed). ? Fever or chills. ? Head, chest, or back pain. ? Dark pee (urine) or blood in the pee. ? Widespread rash. ? Fast heartbeat. ? Feeling dizzy or light-headed. You may receive your blood transfusion in an outpatient setting. If so, you will be told whom to contact to report any reactions. These symptoms may be an emergency. Do not wait to see if the symptoms will go away. Get medical help right away. Call your local emergency services (911 in the [place] Do not drive yourself to the hospital. Summary  Bruising and soreness at the IV site are common.  Check your insertion site every day for signs of infection.  Rest as told by your doctor. Go back to your normal activities as told by your doctor.  Get help right away if you have signs of a serious reaction. This information is not intended to replace advice given to you by your health care provider. Make sure you discuss any questions you have with your health care provider. Document Revised: 12/26/2018 Document Reviewed: 12/26/2018 Elsevier Patient Education  [place].

## 2019-11-17 NOTE — Patient Instructions (Signed)

## 2019-11-17 NOTE — Progress Notes (Signed)
1st bag Platelets completed at 1215.

## 2019-11-17 NOTE — Progress Notes (Signed)
[facility] [place], [place] [address] [phone]   New Patient Evaluation  Date of Service: 11/17/19 Patient Name: [name] Patient MRN: [id number] Patient DOB: [birth year] Provider: Ventura Sellers, MD  Identifying Statement:  [name] is a 69 y.o. female with Brain metastases [facility]) [C79.31] who presents for initial consultation and evaluation regarding cancer associated neurologic deficits.    Referring Provider: Rutherford Guys, MD [address] [place],  [place] [address]  Primary Cancer:  Oncologic History: Oncology History  Small cell carcinoma of lower lobe of right lung ([place])  08/28/2018 Initial Diagnosis   Small cell carcinoma of lower lobe of right lung ([place])   09/03/2018 - 12/22/2018 Chemotherapy   The patient had palonosetron (ALOXI) injection 0.25 mg, 0.25 mg, Intravenous,  Once, 5 of 6 cycles Administration: 0.25 mg (09/03/2018), 0.25 mg (12/02/2018), 0.25 mg (09/25/2018), 0.25 mg (10/16/2018), 0.25 mg (11/12/2018) pegfilgrastim (NEULASTA ONPRO KIT) injection 6 mg, 6 mg, Subcutaneous, Once, 4 of 5 cycles Administration: 6 mg (09/27/2018), 6 mg (10/18/2018), 6 mg (12/04/2018), 6 mg (11/14/2018) CISplatin (PLATINOL) 165 mg in sodium chloride 0.9 % 500 mL chemo infusion, 80 mg/m2 = 165 mg, Intravenous,  Once, 5 of 6 cycles Dose modification: 40 mg/m2 (original dose 80 mg/m2, Cycle 5, Reason: Change in SCr/CrCl) Administration: 165 mg (09/03/2018), 82 mg (12/02/2018), 165 mg (09/25/2018), 165 mg (10/16/2018), 82 mg (11/12/2018) etoposide (VEPESID) 200 mg in sodium chloride 0.9 % 500 mL chemo infusion, 97 mg/m2 = 210 mg, Intravenous,  Once, 5 of 6 cycles Dose modification: 80 mg/m2 (original dose 100 mg/m2, Cycle 5, Reason: Change in SCr/CrCl) Administration: 200 mg (09/03/2018), 200 mg (09/04/2018), 200 mg (09/05/2018), 160 mg (12/02/2018), 160 mg (12/03/2018), 160 mg (12/04/2018), 200 mg (09/25/2018), 200 mg (09/26/2018),  200 mg (09/27/2018), 200 mg (10/16/2018), 200 mg (10/17/2018), 200 mg (10/18/2018), 160 mg (11/12/2018), 160 mg (11/13/2018), 160 mg (11/14/2018) fosaprepitant (EMEND) 150 mg, dexamethasone (DECADRON) 12 mg in sodium chloride 0.9 % 145 mL IVPB, , Intravenous,  Once, 5 of 6 cycles Administration:  (09/03/2018),  (12/02/2018),  (09/25/2018),  (10/16/2018),  (11/12/2018)  for chemotherapy treatment.    11/03/2019 -  Chemotherapy   The patient had palonosetron (ALOXI) injection 0.25 mg, 0.25 mg, Intravenous,  Once, 1 of 4 cycles Administration: 0.25 mg (11/03/2019) pegfilgrastim (NEULASTA ONPRO KIT) injection 6 mg, 6 mg, Subcutaneous, Once, 1 of 4 cycles Administration: 6 mg (11/05/2019) CARBOplatin (PARAPLATIN) 390 mg in sodium chloride 0.9 % 250 mL chemo infusion, 390 mg (100 % of original dose 389 mg), Intravenous,  Once, 1 of 4 cycles Dose modification: 389 mg (original dose 389 mg, Cycle 1) Administration: 390 mg (11/03/2019) etoposide (VEPESID) 200 mg in sodium chloride 0.9 % 500 mL chemo infusion, 100 mg/m2 = 200 mg, Intravenous,  Once, 1 of 4 cycles Administration: 200 mg (11/03/2019), 200 mg (11/04/2019), 200 mg (11/05/2019) fosaprepitant (EMEND) 150 mg in sodium chloride 0.9 % 145 mL IVPB, 150 mg, Intravenous,  Once, 1 of 4 cycles Administration: 150 mg (11/03/2019) durvalumab (IMFINZI) 1,500 mg in sodium chloride 0.9 % 100 mL chemo infusion, 1,500 mg, Intravenous,  Once, 1 of 8 cycles Administration: 1,500 mg (11/03/2019)  for chemotherapy treatment.    Metastatic small cell carcinoma to brain ([place])  10/07/2019 Initial Diagnosis   Metastatic small cell carcinoma to brain ([place])   11/03/2019 -  Chemotherapy   The patient had palonosetron (ALOXI) injection 0.25 mg, 0.25 mg, Intravenous,  Once, 1  of 4 cycles Administration: 0.25 mg (11/03/2019) pegfilgrastim (NEULASTA ONPRO KIT) injection 6 mg, 6 mg, Subcutaneous, Once, 1 of 4 cycles Administration: 6 mg (11/05/2019) CARBOplatin (PARAPLATIN) 390 mg in sodium  chloride 0.9 % 250 mL chemo infusion, 390 mg (100 % of original dose 389 mg), Intravenous,  Once, 1 of 4 cycles Dose modification: 389 mg (original dose 389 mg, Cycle 1) Administration: 390 mg (11/03/2019) etoposide (VEPESID) 200 mg in sodium chloride 0.9 % 500 mL chemo infusion, 100 mg/m2 = 200 mg, Intravenous,  Once, 1 of 4 cycles Administration: 200 mg (11/03/2019), 200 mg (11/04/2019), 200 mg (11/05/2019) fosaprepitant (EMEND) 150 mg in sodium chloride 0.9 % 145 mL IVPB, 150 mg, Intravenous,  Once, 1 of 4 cycles Administration: 150 mg (11/03/2019) durvalumab (IMFINZI) 1,500 mg in sodium chloride 0.9 % 100 mL chemo infusion, 1,500 mg, Intravenous,  Once, 1 of 8 cycles Administration: 1,500 mg (11/03/2019)  for chemotherapy treatment.     CNS Oncologic History 10/21/19: Completed WBRT ([place])  History of Present Illness: The patient's records from the referring physician were obtained and reviewed and the patient interviewed to confirm this HPI.  [name] presents today for evaluation following completion of whole brain radiation with Dr. Sondra Come earlier this month.  She and her husband describe improvement in her cognition and balance from prior.  She is still relying on a rolling walker for ambulation around the home.  No issues with hand functionality or slurred speech.  No longer requiring steroids.  Take Lyrica and Cymbalta for neuropathic symptoms following initial chemotherapy. She is currently undergoing salvage chemotherapy with Dr. Julien Nordmann, has transfusion needed today because of cytopenias.  Medications: Current Outpatient Medications on File Prior to Visit  Medication Sig Dispense Refill  . acetaminophen (TYLENOL) 650 MG CR tablet Take 650 mg by mouth every 8 (eight) hours as needed for pain.    Marland Kitchen amLODipine (NORVASC) 10 MG tablet Take 1 tablet (10 mg total) by mouth daily as needed. (Patient taking differently: Take 10 mg by mouth daily as needed. Take only if SBP >140)    .  atorvastatin (LIPITOR) 80 MG tablet TAKE 1 TABLET (80 MG TOTAL) BY MOUTH DAILY AT 6 PM. 90 tablet 3  . DULoxetine (CYMBALTA) 30 MG capsule TAKE 1 CAPSULE BY MOUTH EVERY DAY (Patient taking differently: Take 30 mg by mouth daily. ) 90 capsule 2  . pregabalin (LYRICA) 50 MG capsule TAKE 1 CAPSULE 3 TIMES A DAY 90 capsule 2  . prochlorperazine (COMPAZINE) 10 MG tablet Take 1 tablet (10 mg total) by mouth every 6 (six) hours as needed for nausea or vomiting. 30 tablet 0  . traMADol (ULTRAM) 50 MG tablet Take 1-2 tablets (50-100 mg total) by mouth at bedtime as needed. 30 tablet 1  . Clobetasol Prop Emollient Base (CLOBETASOL PROPIONATE E) 0.05 % emollient cream Apply 1 application topically 2 (two) times daily. (Patient not taking: Reported on 10/27/2019) 60 g 3  . rivaroxaban (XARELTO) 20 MG TABS tablet Take 1 tablet (20 mg total) by mouth daily with supper. (Patient not taking: Reported on 11/17/2019) 30 tablet 2   No current facility-administered medications on file prior to visit.    Allergies: No Known Allergies Past Medical History:  Past Medical History:  Diagnosis Date  . Arthritis   . Chronic pain   . Hyperlipidemia   . Hypertension   . Low blood magnesium 10/04/2018  . lung ca dx'd 06/2018  . Stroke ([place])    06/19/2018 minor  Past Surgical History:  Past Surgical History:  Procedure Laterality Date  . BIOPSY  06/19/2018   Procedure: BIOPSY;  Surgeon: Laurence Spates, MD;  Location: [facility];  Service: Endoscopy;;  . BIOPSY  10/11/2018   Procedure: BIOPSY;  Surgeon: Laurence Spates, MD;  Location: [facility];  Service: Endoscopy;;  . ENDARTERECTOMY Right 06/26/2018   Procedure: ENDARTERECTOMY CAROTID RIGHT;  Surgeon: Serafina Mitchell, MD;  Location: [facility] OR;  Service: Vascular;  Laterality: Right;  . ESOPHAGOGASTRODUODENOSCOPY (EGD) WITH PROPOFOL N/A 06/19/2018   Procedure: ESOPHAGOGASTRODUODENOSCOPY (EGD) WITH PROPOFOL;  Surgeon: Laurence Spates, MD;  Location: [facility];   Service: Endoscopy;  Laterality: N/A;  . FLEXIBLE SIGMOIDOSCOPY N/A 10/11/2018   Procedure: FLEXIBLE SIGMOIDOSCOPY;  Surgeon: Laurence Spates, MD;  Location: [facility];  Service: Endoscopy;  Laterality: N/A;  . IR IMAGING GUIDED PORT INSERTION  10/07/2018  . PATCH ANGIOPLASTY Right 06/26/2018   Procedure: PATCH ANGIOPLASTY USING A XENOSURE 1cm x 6cm BIOLOGIC PATCH;  Surgeon: Serafina Mitchell, MD;  Location: [facility];  Service: Vascular;  Laterality: Right;  Marland Kitchen VIDEO BRONCHOSCOPY WITH ENDOBRONCHIAL ULTRASOUND N/A 08/14/2018   Procedure: VIDEO BRONCHOSCOPY WITH ENDOBRONCHIAL ULTRASOUND;  Surgeon: Collene Gobble, MD;  Location: [facility] OR;  Service: Thoracic;  Laterality: N/A;   Social History:  Social History   Socioeconomic History  . Marital status: Single    Spouse name: Not on file  . Number of children: 0  . Years of education: Not on file  . Highest education level: Not on file  Occupational History  . Not on file  Tobacco Use  . Smoking status: Former Smoker    Packs/day: 1.00    Years: 10.00    Pack years: 10.00    Types: Cigarettes    Quit date: 06/19/2018    Years since quitting: 1.4  . Smokeless tobacco: Never Used  . Tobacco comment: Currently using Nicorette Gum  Substance and Sexual Activity  . Alcohol use: Yes    Comment: wine 2 glasses a day   . Drug use: Never    Comment: Hemp Oil  . Sexual activity: Not Currently  Other Topics Concern  . 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 Difficulty of Paying Living Expenses:   Food Insecurity:   . Worried About Charity fundraiser in the Last Year:   . Arboriculturist in the Last Year:   Transportation Needs:   . Film/video editor (Medical):   Marland Kitchen Lack of Transportation (Non-Medical):   Physical Activity:   . Days of Exercise per Week:   . Minutes  of Exercise per Session:   Stress:   . Feeling of Stress :   Social Connections:   . Frequency of Communication with Friends and Family:   . Frequency of Social Gatherings with Friends and Family:   . Attends Religious Services:   . Active Member of Clubs or Organizations:   . Attends Archivist Meetings:   Marland Kitchen Marital Status:   Intimate Partner Violence:   . Fear of Current or Ex-Partner:   . Emotionally Abused:   Marland Kitchen Physically Abused:   . Sexually Abused:    Family History:  Family History  Problem Relation Age of Onset  .  Cancer Mother        lung cancer  . Diabetes Other   . Hyperlipidemia Other   . Hypertension Other   . Cancer Other     Review of Systems: Constitutional: Doesn't report fevers, chills or abnormal weight loss Eyes: Doesn't report blurriness of vision Ears, nose, mouth, throat, and face: Doesn't report sore throat Respiratory: Doesn't report cough, dyspnea or wheezes Cardiovascular: Doesn't report palpitation, chest discomfort  Gastrointestinal:  Doesn't report nausea, constipation, diarrhea GU: Doesn't report incontinence Skin: Doesn't report skin rashes Neurological: Per HPI Musculoskeletal: Doesn't report joint pain Behavioral/Psych: Doesn't report anxiety  Physical Exam: Vitals:   11/17/19 0950  BP: 116/82  Pulse: (!) 102  Resp: 18  Temp: 98 F (36.7 C)  SpO2: 99%   KPS: 80. General: Alert, cooperative, pleasant, in no acute distress Head: Normal EENT: No conjunctival injection or scleral icterus.  Lungs: Resp effort normal Cardiac: Regular rate Abdomen: Non-distended abdomen Skin: No rashes cyanosis or petechiae. Extremities: No clubbing or edema  Neurologic Exam: Mental Status: Awake, alert, attentive to examiner. Oriented to self and environment. Language is fluent with intact comprehension.  Cranial Nerves: Visual acuity is grossly normal. Visual fields are full. Extra-ocular movements intact. No ptosis. Face is  symmetric Motor: Tone and bulk are normal. Power is full in both arms and legs. Reflexes are symmetric, no pathologic reflexes present.  Sensory: Impaired stocking glove pattern Gait: Dystaxic   Labs: I have reviewed the data as listed    Component Value Date/Time   NA 144 11/17/2019 0921   NA 141 10/13/2019 1558   K 3.7 11/17/2019 0921   CL 110 11/17/2019 0921   CO2 25 11/17/2019 0921   GLUCOSE 100 (H) 11/17/2019 0921   BUN 13 11/17/2019 0921   BUN 26 10/13/2019 1558   CREATININE 1.17 (H) 11/17/2019 0921   CALCIUM 8.5 (L) 11/17/2019 0921   PROT 5.4 (L) 11/17/2019 0921   PROT 5.9 (L) 10/08/2019 1456   ALBUMIN 3.0 (L) 11/17/2019 0921   ALBUMIN 3.7 (L) 10/08/2019 1456   AST 14 (L) 11/17/2019 0921   ALT 17 11/17/2019 0921   ALKPHOS 73 11/17/2019 0921   BILITOT 0.3 11/17/2019 0921   GFRNONAA 48 (L) 11/17/2019 0921   GFRAA 56 (L) 11/17/2019 0921   Lab Results  Component Value Date   WBC 2.5 (L) 11/17/2019   NEUTROABS 1.3 (L) 11/17/2019   HGB 7.2 (L) 11/17/2019   HCT 22.0 (L) 11/17/2019   MCV 97.3 11/17/2019   PLT <5 (LL) 11/17/2019    Imaging:  CT Chest W Contrast  Result Date: 10/24/2019 CLINICAL DATA:  Limited stage right small cell lung cancer status post radiation therapy and chemotherapy. Restaging. EXAM: CT CHEST WITH CONTRAST TECHNIQUE: Multidetector CT imaging of the chest was performed during intravenous contrast administration. CONTRAST:  71m OMNIPAQUE IOHEXOL 300 MG/ML  SOLN COMPARISON:  08/09/2019 chest CT. FINDINGS: Cardiovascular: Normal heart size. No significant pericardial effusion/thickening. Three-vessel coronary atherosclerosis. Right internal jugular Port-A-Cath terminates at the cavoatrial junction. Atherosclerotic nonaneurysmal thoracic aorta. Normal caliber pulmonary arteries. No central pulmonary emboli. Mediastinum/Nodes: No discrete thyroid nodules. Unremarkable esophagus. No axillary adenopathy. Enlarged 3.4 cm static cyst diameter right  paratracheal node (series 2/image 45), previously 3.5 cm using similar measurement technique, stable. Enlarged 1.4 cm subcarinal node (series 2/image 58), previously 1.4 cm, stable. No new pathologically enlarged mediastinal nodes. Enlarged 1.1 cm right hilar node (series 2/image 61), stable. No left hilar adenopathy. Lungs/Pleura: No pneumothorax. Trace dependent right pleural effusion,  decreased. No left pleural effusion. Anterior right lower lobe 2.8 x 2.2 cm solid pulmonary nodule (series 5/image 77), previously 2.7 x 2.1 cm, not appreciably changed. Sharply marginated patchy and bandlike right perihilar and right mid lung consolidation with associated volume loss and distortion, compatible with evolving postradiation change. A few scattered small solid pulmonary nodules in the basilar right upper lobe, largest 0.4 cm (series 5/image 57), stable. Solid subpleural 0.4 cm posterior left lower lobe nodule (series 5/image 86), stable. No new significant pulmonary nodules. Upper abdomen: Stable 1.3 cm left adrenal nodule, unchanged since 08/30/2018 PET-CT, where it was non FDG avid, compatible with a benign adenoma. Musculoskeletal: Sclerotic lesions in the posterior elements on the right at T5 and T6, mildly increased in density. New mild patchy sclerosis in the T6 vertebral body with new pathologic mild T6 vertebral fracture. Mild thoracic spondylosis. IMPRESSION: 1. Anterior right lower lobe 2.8 cm solid pulmonary nodule, not substantially changed. Expected evolution of postradiation change in the right lung. 2. Stable right hilar and mediastinal lymphadenopathy. 3. Sclerotic osseous metastases in the posterior elements on the right at T5 and T6, mildly increased in density. New mild patchy sclerosis in the T6 vertebral body with new pathologic mild T6 vertebral fracture. These findings are equivocal for evolving treatment response within previously occult bone metastasis versus progression of sclerotic osseous  metastatic disease. 4. Trace dependent right pleural effusion, decreased. 5. Three-vessel coronary atherosclerosis. 6. Stable left adrenal adenoma. 7. Aortic Atherosclerosis (ICD10-I70.0). Electronically Signed   By: Ilona Sorrel M.D.   On: 10/24/2019 13:54     Assessment/Plan Brain metastases ([facility]) [C79.31]  [name] is clinically stable today, with good clinical response to WBRT.    No further workup today or medication changes.  We spent twenty additional minutes teaching regarding the natural history, biology, and historical experience in the treatment of neurologic complications of cancer.   We appreciate the opportunity to participate in the care of [name].  We ask that she return to clinic in 2 months following next brain MRI, or sooner as needed.  All questions were answered. The patient knows to call the clinic with any problems, questions or concerns. No barriers to learning were detected.  The total time spent in the encounter was 40 minutes and more than 50% was on counseling and review of test results   Ventura Sellers, MD Medical Director of Neuro-Oncology [facility] [place] 11/17/19 4:26 PM

## 2019-11-17 NOTE — Progress Notes (Signed)
Pt transferred to infusion suite Beds section via w/c with belongings, report given bedside to [facility] B RN.

## 2019-11-18 LAB — PREPARE PLATELET PHERESIS
Unit division: 0
Unit division: 0
Unit division: 0

## 2019-11-18 LAB — BPAM RBC
Blood Product Expiration Date: 202105222359
Blood Product Expiration Date: 202105222359
ISSUE DATE / TIME: 202105031305
ISSUE DATE / TIME: 202105031305
Unit Type and Rh: 6200
Unit Type and Rh: 6200

## 2019-11-18 LAB — TYPE AND SCREEN
ABO/RH(D): A POS
Antibody Screen: NEGATIVE
Unit division: 0
Unit division: 0

## 2019-11-18 LAB — BPAM PLATELET PHERESIS
Blood Product Expiration Date: 202105052359
Blood Product Expiration Date: 202105052359
Blood Product Expiration Date: 202105052359
ISSUE DATE / TIME: 202105031128
ISSUE DATE / TIME: 202105031222
Unit Type and Rh: 6200
Unit Type and Rh: 6200
Unit Type and Rh: 5100

## 2019-11-18 NOTE — Progress Notes (Signed)
Pharmacist Chemotherapy Monitoring - Follow Up Assessment    I verify that I have reviewed each item in the below checklist:  . Regimen for the patient is scheduled for the appropriate day and plan matches scheduled date. Marland Kitchen Appropriate non-routine labs are ordered dependent on drug ordered. . If applicable, additional medications reviewed and ordered per protocol based on lifetime cumulative doses and/or treatment regimen.   Plan for follow-up and/or issues identified: No . I-vent associated with next due treatment: No . MD and/or nursing notified: No  Shanita Kanan K 11/18/2019 11:28 AM

## 2019-11-19 ENCOUNTER — Ambulatory Visit: Payer: Medicare PPO

## 2019-11-19 NOTE — Progress Notes (Signed)
Pharmacist Chemotherapy Monitoring - Follow Up Assessment    I verify that I have reviewed each item in the below checklist:  . Regimen for the patient is scheduled for the appropriate day and plan matches scheduled date. Marland Kitchen Appropriate non-routine labs are ordered dependent on drug ordered. . If applicable, additional medications reviewed and ordered per protocol based on lifetime cumulative doses and/or treatment regimen.   Plan for follow-up and/or issues identified: No . I-vent associated with next due treatment: No . MD and/or nursing notified: No  [name] D 11/19/2019 4:28 PM

## 2019-11-23 NOTE — Progress Notes (Signed)
[facility] OFFICE PROGRESS NOTE  Rutherford Guys, MD [address] [name] [address]  DIAGNOSIS: DIAGNOSIS: Metastatic small cell lung cancer initially diagnosed asLimited stage (T2b, N2,M0) small cell lung cancer, pending further staging work-up diagnosed and January 2020. She presented with right lower lobe lung mass in addition to right hilar and mediastinal lymphadenopathy.She has evidence of metastatic disease to the brain in March 2021.  PRIOR THERAPY:  1)Systemic chemotherapy concurrent with radiation. Chemotherapy withcisplatin 80 mg/M2 on day 1 and etoposide 100 mg/M2 on days 1, 2 and 3 every 3 weeks.Last dose 12/04/18. Status post5cycles.Dose reducedstarting fromcycle #4to cisplatin 40 mg/m2 and etoposide 80 mg/m2 due to renal insufficiency. 2)whole brain irradiation under the care of Dr. Sondra Come. Last treatment 10/21/19  CURRENT THERAPY: Systemic chemotherapy with carboplatin for AUC of 5 on day 1, etoposide 100 mg/M2 on days 1, 2 and 3 with Neulasta support in addition to Imfinzi 1500 mg IV every 3 weeks during chemotherapy. First dose on 11/03/19. Status post 1 cycle.   INTERVAL HISTORY: MYKELLE COCKERELL 69 y.o. female returns to the clinic today for follow-up visit accompanied by her significant other, Dominica Severin.  The patient was initially diagnosed with limited stage small cell lung cancer in January 2020.  The patient was recently found to have extensive stage small cell lung cancer and started treatment with carboplatin, etoposide, and Imfinzi.  She is status post 1 cycle and tolerated it fairly well except she experienced significant pancytopenia following her first cycle of treatment which required a blood transfusion with 2 units of blood and a platelet transfusion.  Her [place] dropped to 0.3, her platelets dropped to less than 5K, and her hemoglobin dropped to 7.2.  The patient denied any normal bleeding including epistaxis, gingival bleeding,  hemoptysis, hematemesis, melena, hematochezia, or hematuria but she endorses extremity bruising.  The patient is feeling fairly well today.  She denies any fever, chills, or night sweats. She reports a decreased appetite but denies weight loss since her last appointment. She denies any chest pain, shortness of breath, cough, or hemoptysis. She experiences intermittent nausea and vomiting water. She states her nausea is well controlled when she takes her anti-emetic. She denies any more headaches or visual changes but reports an episode of dizziness/vertigo while laying in bed yesterday. Of note, she recently was treated for metastatic disease to the brain/cerebellum. She had a 1 month follow up appointment with Dr. Sondra Come from radiation oncology this morning. She denies any rashes. She reports she has a mild sore throat since waking up this AM. Denies nasal congestion or drainage. Reports improvement with sucking on cough drops.  She is here today for evaluation before starting cycle #2.  MEDICAL HISTORY: Past Medical History:  Diagnosis Date  . Arthritis   . Chronic pain   . Hyperlipidemia   . Hypertension   . Low blood magnesium 10/04/2018  . lung ca dx'd 06/2018  . Stroke ([place])    06/19/2018 minor    ALLERGIES:  has No Known Allergies.  MEDICATIONS:  Current Outpatient Medications  Medication Sig Dispense Refill  . acetaminophen (TYLENOL) 650 MG CR tablet Take 650 mg by mouth every 8 (eight) hours as needed for pain.    Marland Kitchen amLODipine (NORVASC) 10 MG tablet Take 1 tablet (10 mg total) by mouth daily as needed. (Patient taking differently: Take 10 mg by mouth daily as needed. Take only if SBP >140)    . atorvastatin (LIPITOR) 80 MG tablet TAKE 1 TABLET (  80 MG TOTAL) BY MOUTH DAILY AT 6 PM. 90 tablet 3  . DULoxetine (CYMBALTA) 30 MG capsule TAKE 1 CAPSULE BY MOUTH EVERY DAY (Patient taking differently: Take 30 mg by mouth daily. ) 90 capsule 2  . pregabalin (LYRICA) 50 MG capsule TAKE 1 CAPSULE  3 TIMES A DAY 90 capsule 2  . prochlorperazine (COMPAZINE) 10 MG tablet Take 1 tablet (10 mg total) by mouth every 6 (six) hours as needed for nausea or vomiting. 30 tablet 0  . traMADol (ULTRAM) 50 MG tablet Take 1-2 tablets (50-100 mg total) by mouth at bedtime as needed. 30 tablet 1  . Clobetasol Prop Emollient Base (CLOBETASOL PROPIONATE E) 0.05 % emollient cream Apply 1 application topically 2 (two) times daily. (Patient not taking: Reported on 10/27/2019) 60 g 3  . rivaroxaban (XARELTO) 20 MG TABS tablet Take 1 tablet (20 mg total) by mouth daily with supper. (Patient not taking: Reported on 11/17/2019) 30 tablet 2   Current Facility-Administered Medications  Medication Dose Route Frequency Provider Last Rate Last Admin  . sodium chloride flush (NS) 0.9 % injection 10 mL  10 mL Intravenous PRN Masao Junker L, PA-C   10 mL at 11/24/19 1239    SURGICAL HISTORY:  Past Surgical History:  Procedure Laterality Date  . BIOPSY  06/19/2018   Procedure: BIOPSY;  Surgeon: Laurence Spates, MD;  Location: [facility];  Service: Endoscopy;;  . BIOPSY  10/11/2018   Procedure: BIOPSY;  Surgeon: Laurence Spates, MD;  Location: [facility];  Service: Endoscopy;;  . ENDARTERECTOMY Right 06/26/2018   Procedure: ENDARTERECTOMY CAROTID RIGHT;  Surgeon: Serafina Mitchell, MD;  Location: [facility] OR;  Service: Vascular;  Laterality: Right;  . ESOPHAGOGASTRODUODENOSCOPY (EGD) WITH PROPOFOL N/A 06/19/2018   Procedure: ESOPHAGOGASTRODUODENOSCOPY (EGD) WITH PROPOFOL;  Surgeon: Laurence Spates, MD;  Location: [facility];  Service: Endoscopy;  Laterality: N/A;  . FLEXIBLE SIGMOIDOSCOPY N/A 10/11/2018   Procedure: FLEXIBLE SIGMOIDOSCOPY;  Surgeon: Laurence Spates, MD;  Location: [facility];  Service: Endoscopy;  Laterality: N/A;  . IR IMAGING GUIDED PORT INSERTION  10/07/2018  . PATCH ANGIOPLASTY Right 06/26/2018   Procedure: PATCH ANGIOPLASTY USING A XENOSURE 1cm x 6cm BIOLOGIC PATCH;  Surgeon: Serafina Mitchell,  MD;  Location: [facility];  Service: Vascular;  Laterality: Right;  Marland Kitchen VIDEO BRONCHOSCOPY WITH ENDOBRONCHIAL ULTRASOUND N/A 08/14/2018   Procedure: VIDEO BRONCHOSCOPY WITH ENDOBRONCHIAL ULTRASOUND;  Surgeon: Collene Gobble, MD;  Location: [facility] OR;  Service: Thoracic;  Laterality: N/A;    REVIEW OF SYSTEMS:   Review of Systems  Constitutional: Positive for fatigue and decreased appetite. Negative for chills and fever HENT: Positive for mild sore throat that started this AM. Negative for mouth sores, nosebleeds, and trouble swallowing.   Eyes: Negative for eye problems and icterus.  Respiratory: Negative for cough, hemoptysis, shortness of breath and wheezing.  Cardiovascular: Negative for chest pain and leg swelling.  Gastrointestinal: Positive for occasional nausea and vomiting. negative for abdominal pain, constipation, and vomiting.  Genitourinary: Negative for bladder incontinence, difficulty urinating, dysuria, frequency and hematuria.   Musculoskeletal: Positive for chronic back pain. Negative for gait problem, neck pain and neck stiffness.  Skin: Negative for itching and rash.  Neurological: Positive for vertigo. Negative for extremity weakness, gait problem, headaches, light-headedness and seizures.  Hematological: Positive for bruising. Negative for adenopathy.  Psychiatric/Behavioral: Negative for confusion, depression and sleep disturbance. The patient is not nervous/anxious.    PHYSICAL EXAMINATION:  Blood pressure (!) 140/98, pulse 94, temperature 99.1 F (37.3 C), temperature  source Temporal, resp. rate 18, height 5\' 6"  (1.676 m), weight 187 lb 11.2 oz (85.1 kg), SpO2 96 %.  ECOG PERFORMANCE STATUS: 1 - Symptomatic but completely ambulatory   Physical Exam  Constitutional: Oriented to person, place, and time and well-developed, well-nourished, and in no distress.  HENT:  Head: Normocephalic and atraumatic.  Mouth/Throat: Oropharynx is clear and moist. No oropharyngeal exudate.  No ulcerations. No erythema.  Eyes: Conjunctivae are normal. Right eye exhibits no discharge. Left eye exhibits no discharge. No scleral icterus.  Neck: Normal range of motion. Neck supple.  Cardiovascular: Normal rate, regular rhythm, normal heart sounds and intact distal pulses.   Pulmonary/Chest: Effort normal and breath sounds normal. No respiratory distress. No wheezes. No rales.  Abdominal: Soft. Bowel sounds are normal. Exhibits no distension and no mass. There is no tenderness.  Musculoskeletal: Normal range of motion. Exhibits no edema.  Lymphadenopathy:    No cervical adenopathy.  Neurological: Alert and oriented to person, place, and time. Exhibits normal muscle tone. Examined in the wheelchair.  Skin: Supraclavicular fat pads noted bilaterally. Skin is warm and dry. No rash noted. Not diaphoretic. No erythema. No pallor.  Psychiatric: Mood, memory and judgment normal.  Vitals reviewed.  LABORATORY DATA: Lab Results  Component Value Date   WBC 5.7 11/24/2019   HGB 9.6 (L) 11/24/2019   HCT 30.2 (L) 11/24/2019   MCV 98.4 11/24/2019   PLT 76 (L) 11/24/2019      Chemistry      Component Value Date/Time   NA 141 11/24/2019 1012   NA 141 10/13/2019 1558   K 3.6 11/24/2019 1012   CL 110 11/24/2019 1012   CO2 25 11/24/2019 1012   BUN 11 11/24/2019 1012   BUN 26 10/13/2019 1558   CREATININE 1.33 (H) 11/24/2019 1012      Component Value Date/Time   CALCIUM 8.7 (L) 11/24/2019 1012   ALKPHOS 93 11/24/2019 1012   AST 17 11/24/2019 1012   ALT 20 11/24/2019 1012   BILITOT 0.3 11/24/2019 1012       RADIOGRAPHIC STUDIES:  No results found.   ASSESSMENT/PLAN:  This is a very pleasant 69 year old Caucasian female who was initially diagnosed with limited small cell lung cancer in January 2020. She initially presented with a right lower lobe lung mass status post 5 cycles of systemic chemotherapy with Cisplatin and Etoposide. Her dose was reduced starting from cycle #4 due  to renal insuffiencey. She also had concurrent radiation.   She was found to have extensive stage disease in March 2021 with the development of metastatic disease to the brain. This was treated with whole brain irradiation under the care of Dr. Sondra Come which was completed in April 2021.   She is currently undergoing treatment with palliative systemic chemotherapy with carboplatin for AUC of 5 on day 1, etoposide 100 mg/M2 on days 1, 2 and 3 with Neulasta support in addition to Imfinzi 1500 mg IV every 3 weeks. She is status post 1 cycle and experienced pancytopenia following cycle #1 requiring a platelet and blood transfusion.   The patient was seen with Dr. Julien Nordmann today.  Her labs show persistent thrombocytopenia with a platelet count of 76k. Dr. Julien Nordmann recommends delaying her treatment by 1 week until further improvement in her thrombocytopenia. We will likely need to reduce her dose of the chemotherapy starting from cycle #2 and will determine when we see her for a following up next week.     We will see her  back for follow-up visit in 1 week for evaluation before starting cycle #2.   Discussed that the patient may resume taking her anti-coagulant since her platelets are >50 k.   For her sore gums/ mild sore throat, no ulcerations, erythema, or exudate appreciated on exam. Encouraged the patient to rinse her mouth with salt water rinses as well as biotene. Advised her to call immediately with any new or worsening symptoms such as fever, nasal congestion, worsening sore throat, etc.   The patient was advised to call immediately if she has any concerning symptoms in the interval. The patient voices understanding of current disease status and treatment options and is in agreement with the current care plan. All questions were answered. The patient knows to call the clinic with any problems, questions or concerns. We can certainly see the patient much sooner if necessary.     No orders of the  defined types were placed in this encounter.    Elgie Landino L Karol Liendo, PA-C 11/24/19  ADDENDUM: Hematology/Oncology Attending: I had a face-to-face encounter with the patient today.  I recommended her care plan.  This is a very pleasant 69 years old white female with recurrent small cell lung cancer.  She started treatment again with systemic chemotherapy with carboplatin and etoposide in addition to Imfinzi status post 1 cycle.  The patient had significant pancytopenia after the first cycle of her treatment. She was supposed to start cycle #2 today but her platelets count are still low at 76,000. I recommended for the patient to delay her treatment by 1 week until improvement of her platelets count. Starting from cycle #2 I will reduce her dose of carboplatin to AUC of 4 and etoposide to 80 mg/M2.  The patient will come back for follow-up visit in 1 week for evaluation before starting cycle #2. She was advised to call immediately if she has any concerning symptoms in the interval. Disclaimer: This note was dictated with voice recognition software. Similar sounding words can inadvertently be transcribed and may be missed upon review. Eilleen Kempf, MD 11/24/19

## 2019-11-24 ENCOUNTER — Inpatient Hospital Stay: Payer: Medicare PPO

## 2019-11-24 ENCOUNTER — Encounter: Payer: Self-pay | Admitting: Physician Assistant

## 2019-11-24 ENCOUNTER — Other Ambulatory Visit: Payer: Self-pay

## 2019-11-24 ENCOUNTER — Inpatient Hospital Stay (HOSPITAL_BASED_OUTPATIENT_CLINIC_OR_DEPARTMENT_OTHER): Payer: Medicare PPO | Admitting: Physician Assistant

## 2019-11-24 ENCOUNTER — Telehealth: Payer: Self-pay | Admitting: Internal Medicine

## 2019-11-24 ENCOUNTER — Ambulatory Visit
Admission: RE | Admit: 2019-11-24 | Discharge: 2019-11-24 | Disposition: A | Discharge status: Home / Self Care | Payer: Medicare PPO | Source: Ambulatory Visit | Attending: Radiation Oncology | Admitting: Radiation Oncology

## 2019-11-24 VITALS — BP 140/98 | HR 98 | Temp 99.1°F | Resp 20 | Ht 66.0 in | Wt 186.6 lb

## 2019-11-24 VITALS — BP 140/98 | HR 94 | Temp 99.1°F | Resp 18 | Ht 66.0 in | Wt 187.7 lb

## 2019-11-24 DIAGNOSIS — J029 Acute pharyngitis, unspecified: Secondary | ICD-10-CM | POA: Diagnosis not present

## 2019-11-24 DIAGNOSIS — Z7901 Long term (current) use of anticoagulants: Secondary | ICD-10-CM | POA: Diagnosis not present

## 2019-11-24 DIAGNOSIS — D696 Thrombocytopenia, unspecified: Secondary | ICD-10-CM | POA: Diagnosis not present

## 2019-11-24 DIAGNOSIS — Z79899 Other long term (current) drug therapy: Secondary | ICD-10-CM | POA: Insufficient documentation

## 2019-11-24 DIAGNOSIS — Z5112 Encounter for antineoplastic immunotherapy: Secondary | ICD-10-CM | POA: Diagnosis not present

## 2019-11-24 DIAGNOSIS — Z7689 Persons encountering health services in other specified circumstances: Secondary | ICD-10-CM | POA: Diagnosis not present

## 2019-11-24 DIAGNOSIS — R42 Dizziness and giddiness: Secondary | ICD-10-CM | POA: Diagnosis not present

## 2019-11-24 DIAGNOSIS — C7931 Secondary malignant neoplasm of brain: Secondary | ICD-10-CM | POA: Diagnosis not present

## 2019-11-24 DIAGNOSIS — C7951 Secondary malignant neoplasm of bone: Secondary | ICD-10-CM | POA: Insufficient documentation

## 2019-11-24 DIAGNOSIS — Z95828 Presence of other vascular implants and grafts: Secondary | ICD-10-CM

## 2019-11-24 DIAGNOSIS — R112 Nausea with vomiting, unspecified: Secondary | ICD-10-CM | POA: Diagnosis not present

## 2019-11-24 DIAGNOSIS — C3431 Malignant neoplasm of lower lobe, right bronchus or lung: Secondary | ICD-10-CM

## 2019-11-24 DIAGNOSIS — Z5111 Encounter for antineoplastic chemotherapy: Secondary | ICD-10-CM | POA: Diagnosis not present

## 2019-11-24 DIAGNOSIS — D61818 Other pancytopenia: Secondary | ICD-10-CM | POA: Diagnosis not present

## 2019-11-24 LAB — CMP (CANCER CENTER ONLY)
ALT: 20 U/L (ref 0–44)
AST: 17 U/L (ref 15–41)
Albumin: 3.1 g/dL — ABNORMAL LOW (ref 3.5–5.0)
Alkaline Phosphatase: 93 U/L (ref 38–126)
Anion gap: 6 (ref 5–15)
BUN: 11 mg/dL (ref 8–23)
CO2: 25 mmol/L (ref 22–32)
Calcium: 8.7 mg/dL — ABNORMAL LOW (ref 8.9–10.3)
Chloride: 110 mmol/L (ref 98–111)
Creatinine: 1.33 mg/dL — ABNORMAL HIGH (ref 0.44–1.00)
GFR, Est AFR Am: 48 mL/min — ABNORMAL LOW (ref >60)
GFR, Estimated: 41 mL/min — ABNORMAL LOW (ref >60)
Glucose, Bld: 95 mg/dL (ref 70–99)
Potassium: 3.6 mmol/L (ref 3.5–5.1)
Sodium: 141 mmol/L (ref 135–145)
Total Bilirubin: 0.3 mg/dL (ref 0.3–1.2)
Total Protein: 5.6 g/dL — ABNORMAL LOW (ref 6.5–8.1)

## 2019-11-24 LAB — CBC WITH DIFFERENTIAL (CANCER CENTER ONLY)
Abs Immature Granulocytes: 0.16 10*3/uL — ABNORMAL HIGH (ref 0.00–0.07)
Basophils Absolute: 0 10*3/uL (ref 0.0–0.1)
Basophils Relative: 0 %
Eosinophils Absolute: 0 10*3/uL (ref 0.0–0.5)
Eosinophils Relative: 0 %
HCT: 30.2 % — ABNORMAL LOW (ref 36.0–46.0)
Hemoglobin: 9.6 g/dL — ABNORMAL LOW (ref 12.0–15.0)
Immature Granulocytes: 3 %
Lymphocytes Relative: 10 %
Lymphs Abs: 0.6 10*3/uL — ABNORMAL LOW (ref 0.7–4.0)
MCH: 31.3 pg (ref 26.0–34.0)
MCHC: 31.8 g/dL (ref 30.0–36.0)
MCV: 98.4 fL (ref 80.0–100.0)
Monocytes Absolute: 0.9 10*3/uL (ref 0.1–1.0)
Monocytes Relative: 15 %
Neutro Abs: 4.1 10*3/uL (ref 1.7–7.7)
Neutrophils Relative %: 72 %
Platelet Count: 76 10*3/uL — ABNORMAL LOW (ref 150–400)
RBC: 3.07 MIL/uL — ABNORMAL LOW (ref 3.87–5.11)
RDW: 13.7 % (ref 11.5–15.5)
WBC Count: 5.7 10*3/uL (ref 4.0–10.5)
nRBC: 0 % (ref 0.0–0.2)

## 2019-11-24 LAB — TSH: TSH: 0.863 u[IU]/mL (ref 0.308–3.960)

## 2019-11-24 MED ORDER — SODIUM CHLORIDE 0.9% FLUSH
10.0000 mL | INTRAVENOUS | Status: DC | PRN
  Administered 2019-11-24: 10 mL
  Filled 2019-11-24: qty 10

## 2019-11-24 MED ORDER — HEPARIN SOD (PORK) LOCK FLUSH 100 UNIT/ML IV SOLN
500.0000 [IU] | Freq: Once | INTRAVENOUS | Status: AC
  Administered 2019-11-24: 500 [IU] via INTRAVENOUS
  Filled 2019-11-24: qty 5

## 2019-11-24 MED ORDER — SODIUM CHLORIDE 0.9% FLUSH
10.0000 mL | INTRAVENOUS | Status: DC | PRN
  Administered 2019-11-24: 10 mL via INTRAVENOUS
  Filled 2019-11-24: qty 10

## 2019-11-24 NOTE — Telephone Encounter (Signed)
Scheduled appt per 5/10 los - pt aware of appt date and time

## 2019-11-24 NOTE — Progress Notes (Incomplete)
  Patient Name: [name] MRN: [id number] DOB: 10-21-1950 Referring Physician: Grant Fontana Date of Service: 10/21/2019 [facility], [place]                                                        End Of Treatment Note  Diagnoses: C34.31-Malignant neoplasm of lower lobe, right bronchus or lung C79.31-Secondary malignant neoplasm of brain  Cancer Staging: Small Cell Carcinoma of the RLLwith multifocal intracranial metastases  Intent: Palliative  Radiation Treatment Dates: 10/08/2019 through 10/21/2019 Site Technique Total Dose (Gy) Dose per Fx (Gy) Completed Fx Beam Energies  Brain: Brain Complex 30/30 3 10/10 6X   Narrative: The patient tolerated radiation therapy relatively well. She reported some tremors at night that she did not believe were seizures, occasional nausea and vomiting, decreased vision, and some memory changes when she is tired but reported improvement in confusion. She denied headaches and changes in hearing. Neuropathy remained at baseline. Of note, the patient continued on 4 mg Decadron daily during treatment.  Plan: The patient will follow-up with radiation oncology in one month. Decadron taper instructions given to patient and husband.  ________________________________________________   Blair Promise, PhD, MD  This document serves as a record of services personally performed by Gery Pray, MD. It was created on his behalf by Clerance Lav, a trained medical scribe. The creation of this record is based on the scribe's personal observations and the provider's statements to them. This document has been checked and approved by the attending provider.

## 2019-11-24 NOTE — Progress Notes (Signed)
Radiation Oncology         [phone] ________________________________  Name: [name] MRN: [id number]  Date: 11/24/2019  DOB: 1951-04-08  Follow-Up Visit Note  CC: Rutherford Guys, MD  Rutherford Guys, MD    ICD-10-CM   1. Metastatic small cell carcinoma to brain (HCC)  C79.31   2. Small cell carcinoma of lower lobe of right lung (HCC)  C34.31     Diagnosis: Small Cell Carcinoma of the RLLwithmultifocal intracranial metastases  Interval Since Last Radiation: One month and four days.  Radiation Treatment Dates: 10/08/2019 through 10/21/2019 Site Technique Total Dose (Gy) Dose per Fx (Gy) Completed Fx Beam Energies  Brain: Brain Complex 30/30 3 10/10 6X    Narrative:  The patient returns today for routine follow-up. CT of chest on 10/24/2019 showed an anterior right lower lobe 2.8 cm solid pulmonary nodule that was no substantially changed with expected evolution of post-radiation change in the right lung. The right hilar and mediastinal lymphadenopathy was stable. Finally, there were sclerotic osseous metastases in the posterior elements on the right at T5 and T6, mildly increased in density and new mild patchy sclerosis in the T6 vertebral body with new pathologic mild T6 vertebral fracture, equivocal with evolving treatment response within previously occult bone metastasis versus progression of sclerotic osseous metastatic disease.  The patient was seen by Dr. Julien Nordmann on 10/27/2019, during which time he was interested in proceed with systemic chemotherapy beginning on 11/03/2019.     The patient was referred to Dr. Mickeal Skinner, medical oncologist, for brain metastases was seen in consultation on 11/17/2019. He noted that the patient was clinically stable with a good clinical response to WBRT and did not recommend any further workup or medication changes at that time.  On review of systems, she reports some vision loss, fatigue, nausea, and vomiting. She denies slurred  speech, headaches, and diarrhea.  ALLERGIES:  has No Known Allergies.  Meds: Current Outpatient Medications  Medication Sig Dispense Refill  . acetaminophen (TYLENOL) 650 MG CR tablet Take 650 mg by mouth every 8 (eight) hours as needed for pain.    Marland Kitchen amLODipine (NORVASC) 10 MG tablet Take 1 tablet (10 mg total) by mouth daily as needed. (Patient taking differently: Take 10 mg by mouth daily as needed. Take only if SBP >140)    . atorvastatin (LIPITOR) 80 MG tablet TAKE 1 TABLET (80 MG TOTAL) BY MOUTH DAILY AT 6 PM. 90 tablet 3  . DULoxetine (CYMBALTA) 30 MG capsule TAKE 1 CAPSULE BY MOUTH EVERY DAY (Patient taking differently: Take 30 mg by mouth daily. ) 90 capsule 2  . pregabalin (LYRICA) 50 MG capsule TAKE 1 CAPSULE 3 TIMES A DAY 90 capsule 2  . prochlorperazine (COMPAZINE) 10 MG tablet Take 1 tablet (10 mg total) by mouth every 6 (six) hours as needed for nausea or vomiting. 30 tablet 0  . traMADol (ULTRAM) 50 MG tablet Take 1-2 tablets (50-100 mg total) by mouth at bedtime as needed. 30 tablet 1  . Clobetasol Prop Emollient Base (CLOBETASOL PROPIONATE E) 0.05 % emollient cream Apply 1 application topically 2 (two) times daily. (Patient not taking: Reported on 10/27/2019) 60 g 3  . rivaroxaban (XARELTO) 20 MG TABS tablet Take 1 tablet (20 mg total) by mouth daily with supper. (Patient not taking: Reported on 11/17/2019) 30 tablet 2   No current facility-administered medications for this encounter.   Facility-Administered Medications Ordered in Other Encounters  Medication Dose Route Frequency Provider Last Rate  Last Admin  . sodium chloride flush (NS) 0.9 % injection 10 mL  10 mL Intracatheter PRN Curt Bears, MD   10 mL at 11/24/19 1003    Physical Findings: The patient is in no acute distress. Patient is alert and oriented.  height is 5\' 6"  (1.676 m) and weight is 186 lb 9.6 oz (84.6 kg). Her temperature is 99.1 F (37.3 C). Her blood pressure is 140/98 (abnormal) and her pulse is  98. Her respiration is 20 and oxygen saturation is 96%.   Lungs are clear to auscultation bilaterally. Heart has regular rate and rhythm. No palpable cervical, supraclavicular, or axillary adenopathy. Abdomen soft, non-tender, normal bowel sounds.  Oral cavity free of secondary infection.  Alert, oriented  Lab Findings: Lab Results  Component Value Date   WBC 5.7 11/24/2019   HGB 9.6 (L) 11/24/2019   HCT 30.2 (L) 11/24/2019   MCV 98.4 11/24/2019   PLT 76 (L) 11/24/2019    Radiographic Findings: No results found.  Impression: Small Cell Carcinoma of the RLLwithmultifocal intracranial metastases  The patient is recovering from the effects of radiation.  Clinically she is doing much better since completion of her whole brain radiation therapy.  Plan: The patient is scheduled for her next chemotherapy infusion this afternoon.  Patient will be following up with Dr. Mickeal Skinner concerning her brain metastasis. MRI Scan is scheduled for later this summer, requested for early July.  follow-up with radiation oncology in as needed since she is following up with neuro oncology.  ____________________________________   Blair Promise, PhD, MD  This document serves as a record of services personally performed by Gery Pray, MD. It was created on his behalf by Clerance Lav, a trained medical scribe. The creation of this record is based on the scribe's personal observations and the provider's statements to them. This document has been checked and approved by the attending provider.

## 2019-11-24 NOTE — Progress Notes (Addendum)
One month follow up appointment for Brain metastases from right lung. Reports some vision loss but no blurred vision. Reports no slurred speech. No headaches has neuropathy however does not affect her fine motor skills able to grasp and pick up items. Reports N/V no diarrhea reported. Reports some fatigue. Elevated blood pressure 140/98 manually. Patient reports taking Norvasc this morning.   Blood pressure (!) 140/98, pulse 98, temperature 99.1 F (37.3 C), resp. rate 20, height 5\' 6"  (1.676 m), weight 186 lb 9.6 oz (84.6 kg), SpO2 96 %.   Last Weight  Most recent update: 11/24/2019 10:33 AM   Weight  84.6 kg (186 lb 9.6 oz)

## 2019-11-25 ENCOUNTER — Inpatient Hospital Stay: Payer: Medicare PPO

## 2019-11-25 NOTE — Progress Notes (Signed)
Pharmacist Chemotherapy Monitoring - Follow Up Assessment    I verify that I have reviewed each item in the below checklist:  . Regimen for the patient is scheduled for the appropriate day and plan matches scheduled date. Marland Kitchen Appropriate non-routine labs are ordered dependent on drug ordered. . If applicable, additional medications reviewed and ordered per protocol based on lifetime cumulative doses and/or treatment regimen.   Plan for follow-up and/or issues identified: Yes . I-vent associated with next due treatment: Yes . MD and/or nursing notified: No   Kennith Center, Pharm.D., CPP 11/25/2019@4 :26 PM

## 2019-11-26 ENCOUNTER — Ambulatory Visit: Payer: Medicare PPO

## 2019-11-28 ENCOUNTER — Ambulatory Visit: Payer: Medicare PPO

## 2019-12-01 ENCOUNTER — Inpatient Hospital Stay: Payer: Medicare PPO

## 2019-12-01 ENCOUNTER — Other Ambulatory Visit: Payer: Medicare PPO

## 2019-12-01 ENCOUNTER — Other Ambulatory Visit: Payer: Self-pay

## 2019-12-01 ENCOUNTER — Encounter: Payer: Self-pay | Admitting: Internal Medicine

## 2019-12-01 ENCOUNTER — Inpatient Hospital Stay (HOSPITAL_BASED_OUTPATIENT_CLINIC_OR_DEPARTMENT_OTHER): Payer: Medicare PPO | Admitting: Internal Medicine

## 2019-12-01 VITALS — BP 142/93 | HR 98 | Temp 98.7°F | Resp 18 | Wt 187.0 lb

## 2019-12-01 DIAGNOSIS — D696 Thrombocytopenia, unspecified: Secondary | ICD-10-CM

## 2019-12-01 DIAGNOSIS — D61818 Other pancytopenia: Secondary | ICD-10-CM | POA: Diagnosis not present

## 2019-12-01 DIAGNOSIS — C3431 Malignant neoplasm of lower lobe, right bronchus or lung: Secondary | ICD-10-CM

## 2019-12-01 DIAGNOSIS — D649 Anemia, unspecified: Secondary | ICD-10-CM

## 2019-12-01 DIAGNOSIS — R112 Nausea with vomiting, unspecified: Secondary | ICD-10-CM | POA: Diagnosis not present

## 2019-12-01 DIAGNOSIS — Z5111 Encounter for antineoplastic chemotherapy: Secondary | ICD-10-CM

## 2019-12-01 DIAGNOSIS — I10 Essential (primary) hypertension: Secondary | ICD-10-CM | POA: Diagnosis not present

## 2019-12-01 DIAGNOSIS — C7931 Secondary malignant neoplasm of brain: Secondary | ICD-10-CM

## 2019-12-01 DIAGNOSIS — R42 Dizziness and giddiness: Secondary | ICD-10-CM | POA: Diagnosis not present

## 2019-12-01 DIAGNOSIS — Z5112 Encounter for antineoplastic immunotherapy: Secondary | ICD-10-CM

## 2019-12-01 DIAGNOSIS — Z7689 Persons encountering health services in other specified circumstances: Secondary | ICD-10-CM | POA: Diagnosis not present

## 2019-12-01 DIAGNOSIS — Z95828 Presence of other vascular implants and grafts: Secondary | ICD-10-CM

## 2019-12-01 DIAGNOSIS — J029 Acute pharyngitis, unspecified: Secondary | ICD-10-CM | POA: Diagnosis not present

## 2019-12-01 LAB — CBC WITH DIFFERENTIAL (CANCER CENTER ONLY)
Abs Immature Granulocytes: 0.01 10*3/uL (ref 0.00–0.07)
Basophils Absolute: 0 10*3/uL (ref 0.0–0.1)
Basophils Relative: 1 %
Eosinophils Absolute: 0 10*3/uL (ref 0.0–0.5)
Eosinophils Relative: 0 %
HCT: 29.1 % — ABNORMAL LOW (ref 36.0–46.0)
Hemoglobin: 9.3 g/dL — ABNORMAL LOW (ref 12.0–15.0)
Immature Granulocytes: 0 %
Lymphocytes Relative: 14 %
Lymphs Abs: 0.5 10*3/uL — ABNORMAL LOW (ref 0.7–4.0)
MCH: 31.3 pg (ref 26.0–34.0)
MCHC: 32 g/dL (ref 30.0–36.0)
MCV: 98 fL (ref 80.0–100.0)
Monocytes Absolute: 0.6 10*3/uL (ref 0.1–1.0)
Monocytes Relative: 17 %
Neutro Abs: 2.5 10*3/uL (ref 1.7–7.7)
Neutrophils Relative %: 68 %
Platelet Count: 215 10*3/uL (ref 150–400)
RBC: 2.97 MIL/uL — ABNORMAL LOW (ref 3.87–5.11)
RDW: 14.7 % (ref 11.5–15.5)
WBC Count: 3.6 10*3/uL — ABNORMAL LOW (ref 4.0–10.5)
nRBC: 0 % (ref 0.0–0.2)

## 2019-12-01 LAB — CMP (CANCER CENTER ONLY)
ALT: 17 U/L (ref 0–44)
AST: 20 U/L (ref 15–41)
Albumin: 3 g/dL — ABNORMAL LOW (ref 3.5–5.0)
Alkaline Phosphatase: 96 U/L (ref 38–126)
Anion gap: 9 (ref 5–15)
BUN: 12 mg/dL (ref 8–23)
CO2: 27 mmol/L (ref 22–32)
Calcium: 8.9 mg/dL (ref 8.9–10.3)
Chloride: 105 mmol/L (ref 98–111)
Creatinine: 1.36 mg/dL — ABNORMAL HIGH (ref 0.44–1.00)
GFR, Est AFR Am: 47 mL/min — ABNORMAL LOW (ref >60)
GFR, Estimated: 40 mL/min — ABNORMAL LOW (ref >60)
Glucose, Bld: 101 mg/dL — ABNORMAL HIGH (ref 70–99)
Potassium: 3.8 mmol/L (ref 3.5–5.1)
Sodium: 141 mmol/L (ref 135–145)
Total Bilirubin: 0.4 mg/dL (ref 0.3–1.2)
Total Protein: 5.8 g/dL — ABNORMAL LOW (ref 6.5–8.1)

## 2019-12-01 MED ORDER — SODIUM CHLORIDE 0.9 % IV SOLN
150.0000 mg | Freq: Once | INTRAVENOUS | Status: AC
  Administered 2019-12-01: 150 mg via INTRAVENOUS
  Filled 2019-12-01: qty 150

## 2019-12-01 MED ORDER — SODIUM CHLORIDE 0.9 % IV SOLN
324.0000 mg | Freq: Once | INTRAVENOUS | Status: AC
  Administered 2019-12-01: 320 mg via INTRAVENOUS
  Filled 2019-12-01: qty 32

## 2019-12-01 MED ORDER — SODIUM CHLORIDE 0.9 % IV SOLN
1500.0000 mg | Freq: Once | INTRAVENOUS | Status: AC
  Administered 2019-12-01: 1500 mg via INTRAVENOUS
  Filled 2019-12-01: qty 30

## 2019-12-01 MED ORDER — SODIUM CHLORIDE 0.9 % IV SOLN
80.0000 mg/m2 | Freq: Once | INTRAVENOUS | Status: AC
  Administered 2019-12-01: 160 mg via INTRAVENOUS
  Filled 2019-12-01: qty 8

## 2019-12-01 MED ORDER — PALONOSETRON HCL INJECTION 0.25 MG/5ML
0.2500 mg | Freq: Once | INTRAVENOUS | Status: AC
  Administered 2019-12-01: 0.25 mg via INTRAVENOUS

## 2019-12-01 MED ORDER — PALONOSETRON HCL INJECTION 0.25 MG/5ML
INTRAVENOUS | Status: AC
  Filled 2019-12-01: qty 5

## 2019-12-01 MED ORDER — SODIUM CHLORIDE 0.9% FLUSH
10.0000 mL | INTRAVENOUS | Status: DC | PRN
  Administered 2019-12-01: 10 mL
  Filled 2019-12-01: qty 10

## 2019-12-01 MED ORDER — SODIUM CHLORIDE 0.9 % IV SOLN
10.0000 mg | Freq: Once | INTRAVENOUS | Status: AC
  Administered 2019-12-01: 10 mg via INTRAVENOUS
  Filled 2019-12-01: qty 10

## 2019-12-01 MED ORDER — HEPARIN SOD (PORK) LOCK FLUSH 100 UNIT/ML IV SOLN
500.0000 [IU] | Freq: Once | INTRAVENOUS | Status: AC | PRN
  Administered 2019-12-01: 500 [IU]
  Filled 2019-12-01: qty 5

## 2019-12-01 MED ORDER — SODIUM CHLORIDE 0.9 % IV SOLN
Freq: Once | INTRAVENOUS | Status: AC
  Filled 2019-12-01: qty 250

## 2019-12-01 NOTE — Patient Instructions (Signed)
[facility] Discharge Instructions for Patients Receiving Chemotherapy  Today you received the following chemotherapy agents: durvalumab, etoposide, and carboplatin.  To help prevent nausea and vomiting after your treatment, we encourage you to take your nausea medication as directed.   If you develop nausea and vomiting that is not controlled by your nausea medication, call the clinic.   BELOW ARE SYMPTOMS THAT SHOULD BE REPORTED IMMEDIATELY:  *FEVER GREATER THAN 100.5 F  *CHILLS WITH OR WITHOUT FEVER  NAUSEA AND VOMITING THAT IS NOT CONTROLLED WITH YOUR NAUSEA MEDICATION  *UNUSUAL SHORTNESS OF BREATH  *UNUSUAL BRUISING OR BLEEDING  TENDERNESS IN MOUTH AND THROAT WITH OR WITHOUT PRESENCE OF ULCERS  *URINARY PROBLEMS  *BOWEL PROBLEMS  UNUSUAL RASH Items with * indicate a potential emergency and should be followed up as soon as possible.  Feel free to call the clinic should you have any questions or concerns. The clinic phone number is [phone].  Please show the [facility] at check-in to the Emergency Department and triage nurse.

## 2019-12-01 NOTE — Progress Notes (Signed)
[facility] [phone]   [phone]  OFFICE PROGRESS NOTE  Rutherford Guys, MD [address] [name] [address]  DIAGNOSIS: Metastatic small cell lung cancer initially diagnosed as Limited stage (T2b, N2, M0) small cell lung cancer, pending further staging work-up diagnosed and January 2020.  She presented with right lower lobe lung mass in addition to right hilar and mediastinal lymphadenopathy.  She has evidence of metastatic disease to the brain in March 2021.  PRIOR THERAPY:  1) Systemic chemotherapy concurrent with radiation.  Chemotherapy withcisplatin 80 mg/M2 on day 1 and etoposide 100 mg/M2 on days 1, 2 and 3 every 3 weeks.First dose September 03, 2018. Status post5cycles.Dose reducedstarting fromcycle #4to cisplatin 40 mg/m2 and etoposide 80 mg/m2 due to renal insufficiency. 2) whole brain irradiation under the care of Dr. Sondra Come.  CURRENT THERAPY: Systemic chemotherapy with carboplatin for AUC of 5 on day 1, etoposide 100 mg/M2 on days 1, 2 and 3 with Neulasta support in addition to Imfinzi 1500 mg IV every 3 weeks during chemotherapy course followed by maintenance Imfinzi every 4 weeks.  Status post 1 cycle.   INTERVAL HISTORY: [name] 69 y.o. female returns to the clinic today for follow-up visit accompanied by her husband.  The patient is feeling fine today with no concerning complaints.  She denied having any current chest pain, shortness of breath, cough or hemoptysis.  She denied having any nausea, vomiting, diarrhea or constipation.  She has no headache or visual changes.  She denied having any recent weight loss or night sweats.  Her treatment was delayed by 1 week because of the significant pancytopenia.  She is here today for evaluation before starting cycle #2.  MEDICAL HISTORY: Past Medical History:  Diagnosis Date  . Arthritis   . Chronic pain   . Hyperlipidemia   . Hypertension   . Low blood magnesium  10/04/2018  . lung ca dx'd 06/2018  . Stroke ([place])    06/19/2018 minor    ALLERGIES:  has No Known Allergies.  MEDICATIONS:  Current Outpatient Medications  Medication Sig Dispense Refill  . acetaminophen (TYLENOL) 650 MG CR tablet Take 650 mg by mouth every 8 (eight) hours as needed for pain.    Marland Kitchen amLODipine (NORVASC) 10 MG tablet Take 1 tablet (10 mg total) by mouth daily as needed. (Patient taking differently: Take 10 mg by mouth daily as needed. Take only if SBP >140)    . atorvastatin (LIPITOR) 80 MG tablet TAKE 1 TABLET (80 MG TOTAL) BY MOUTH DAILY AT 6 PM. 90 tablet 3  . Clobetasol Prop Emollient Base (CLOBETASOL PROPIONATE E) 0.05 % emollient cream Apply 1 application topically 2 (two) times daily. (Patient not taking: Reported on 10/27/2019) 60 g 3  . DULoxetine (CYMBALTA) 30 MG capsule TAKE 1 CAPSULE BY MOUTH EVERY DAY (Patient taking differently: Take 30 mg by mouth daily. ) 90 capsule 2  . pregabalin (LYRICA) 50 MG capsule TAKE 1 CAPSULE 3 TIMES A DAY 90 capsule 2  . prochlorperazine (COMPAZINE) 10 MG tablet Take 1 tablet (10 mg total) by mouth every 6 (six) hours as needed for nausea or vomiting. 30 tablet 0  . rivaroxaban (XARELTO) 20 MG TABS tablet Take 1 tablet (20 mg total) by mouth daily with supper. (Patient not taking: Reported on 11/17/2019) 30 tablet 2  . traMADol (ULTRAM) 50 MG tablet Take 1-2 tablets (50-100 mg total) by mouth at bedtime as needed. 30 tablet 1  No current facility-administered medications for this visit.   Facility-Administered Medications Ordered in Other Visits  Medication Dose Route Frequency Provider Last Rate Last Admin  . sodium chloride flush (NS) 0.9 % injection 10 mL  10 mL Intracatheter PRN Curt Bears, MD   10 mL at 12/01/19 0815    SURGICAL HISTORY:  Past Surgical History:  Procedure Laterality Date  . BIOPSY  06/19/2018   Procedure: BIOPSY;  Surgeon: Laurence Spates, MD;  Location: [facility];  Service: Endoscopy;;  . BIOPSY   10/11/2018   Procedure: BIOPSY;  Surgeon: Laurence Spates, MD;  Location: [facility];  Service: Endoscopy;;  . ENDARTERECTOMY Right 06/26/2018   Procedure: ENDARTERECTOMY CAROTID RIGHT;  Surgeon: Serafina Mitchell, MD;  Location: [facility] OR;  Service: Vascular;  Laterality: Right;  . ESOPHAGOGASTRODUODENOSCOPY (EGD) WITH PROPOFOL N/A 06/19/2018   Procedure: ESOPHAGOGASTRODUODENOSCOPY (EGD) WITH PROPOFOL;  Surgeon: Laurence Spates, MD;  Location: [facility];  Service: Endoscopy;  Laterality: N/A;  . FLEXIBLE SIGMOIDOSCOPY N/A 10/11/2018   Procedure: FLEXIBLE SIGMOIDOSCOPY;  Surgeon: Laurence Spates, MD;  [facility]: [facility];  Service: Endoscopy;  Laterality: N/A;  . IR IMAGING GUIDED PORT INSERTION  10/07/2018  . PATCH ANGIOPLASTY Right 06/26/2018   Procedure: PATCH ANGIOPLASTY USING A XENOSURE 1cm x 6cm BIOLOGIC PATCH;  Surgeon: Serafina Mitchell, MD;  Location: [facility];  Service: Vascular;  Laterality: Right;  Marland Kitchen VIDEO BRONCHOSCOPY WITH ENDOBRONCHIAL ULTRASOUND N/A 08/14/2018   Procedure: VIDEO BRONCHOSCOPY WITH ENDOBRONCHIAL ULTRASOUND;  Surgeon: Collene Gobble, MD;  Location: [facility] OR;  Service: Thoracic;  Laterality: N/A;    REVIEW OF SYSTEMS:  A comprehensive review of systems was negative except for: Constitutional: positive for fatigue   PHYSICAL EXAMINATION: General appearance: alert, cooperative, fatigued and no distress Head: Normocephalic, without obvious abnormality, atraumatic Neck: no adenopathy, no JVD, supple, symmetrical, trachea midline and thyroid not enlarged, symmetric, no tenderness/mass/nodules Lymph nodes: Cervical, supraclavicular, and axillary nodes normal. Resp: clear to auscultation bilaterally Back: symmetric, no curvature. ROM normal. No CVA tenderness. Cardio: regular rate and rhythm, S1, S2 normal, no murmur, click, rub or gallop GI: soft, non-tender; bowel sounds normal; no masses,  no organomegaly Extremities: extremities normal, atraumatic, no cyanosis or  edema Neurologic: Alert and oriented X 3, normal strength and tone. Normal symmetric reflexes. Normal coordination and gait  ECOG PERFORMANCE STATUS: 1 - Symptomatic but completely ambulatory  Blood pressure (!) 142/93, pulse 98, temperature 98.7 F (37.1 C), temperature source Oral, resp. rate 18, weight 187 lb 0.5 oz (84.8 kg), SpO2 98 %.  LABORATORY DATA: Lab Results  Component Value Date   WBC 3.6 (L) 12/01/2019   HGB 9.3 (L) 12/01/2019   HCT 29.1 (L) 12/01/2019   MCV 98.0 12/01/2019   PLT 215 12/01/2019      Chemistry      Component Value Date/Time   NA 141 11/24/2019 1012   NA 141 10/13/2019 1558   K 3.6 11/24/2019 1012   CL 110 11/24/2019 1012   CO2 25 11/24/2019 1012   BUN 11 11/24/2019 1012   BUN 26 10/13/2019 1558   CREATININE 1.33 (H) 11/24/2019 1012      Component Value Date/Time   CALCIUM 8.7 (L) 11/24/2019 1012   ALKPHOS 93 11/24/2019 1012   AST 17 11/24/2019 1012   ALT 20 11/24/2019 1012   BILITOT 0.3 11/24/2019 1012       RADIOGRAPHIC STUDIES: No results found.  ASSESSMENT AND PLAN: This is a very pleasant 69 years old white female with relapsed small cell  lung cancer initially diagnosed as limited stage small cell lung cancer diagnosed in January 2020 status post systemic chemotherapy initially with cisplatin and etoposide status post 5 cycles.  Her dose of cisplatin and etoposide was significantly reduced starting from cycle #4 secondary to worsening renal insufficiency.  Her treatment was concurrent with radiotherapy. The patient had evidence for metastatic disease to the brain and bone in March 2021. She underwent whole brain irradiation under the care of Dr. Sondra Come. Her scan showed no clear evidence for progression in the lung but there was new sclerotic bone metastasis. The patient started systemic chemotherapy with carboplatin for AUC of 5 on day 1, etoposide 100 mg/M2 on days 1, 2, 3 with Neulasta support in addition to Imfinzi 1500 mg IV every 3  weeks, status post 1 cycle.  She has a rough time with this treatment with significant pancytopenia and her cycle 2 of treatment treatment was delayed by 1 week because of the pancytopenia. The patient is feeling much better today.  We will proceed with cycle #2 but I will reduce the dose of carboplatin to AUC of 4 and etoposide to 80 mg/M2 starting from cycle #2. The patient will come back for follow-up visit in 3 weeks for evaluation with repeat CT scan of the chest, abdomen pelvis for restaging of her disease. For the recent right jugular vein thrombosis, the patient will continue her current treatment with Xarelto and I give her a refill of her medication today. She was advised to call immediately if she has any concerning symptoms in the interval. The patient voices understanding of current disease status and treatment options and is in agreement with the current care plan.  All questions were answered. The patient knows to call the clinic with any problems, questions or concerns. We can certainly see the patient much sooner if necessary.   Disclaimer: This note was dictated with voice recognition software. Similar sounding words can inadvertently be transcribed and may not be corrected upon review.

## 2019-12-01 NOTE — Progress Notes (Signed)
Met with patient in infusion to introduce myself as Arboriculturist and to offer available resources.  Discussed one-time $1000 Advertising account executive and to assist with personal expenses while going through treatment.  Gave her my card if interested in applying and for any additional financial questions or concerns.

## 2019-12-02 ENCOUNTER — Telehealth: Payer: Self-pay | Admitting: Internal Medicine

## 2019-12-02 ENCOUNTER — Inpatient Hospital Stay: Payer: Medicare PPO

## 2019-12-02 ENCOUNTER — Other Ambulatory Visit: Payer: Self-pay

## 2019-12-02 VITALS — BP 139/85 | HR 94 | Temp 98.6°F | Resp 18

## 2019-12-02 DIAGNOSIS — Z5112 Encounter for antineoplastic immunotherapy: Secondary | ICD-10-CM | POA: Diagnosis not present

## 2019-12-02 DIAGNOSIS — Z7689 Persons encountering health services in other specified circumstances: Secondary | ICD-10-CM | POA: Diagnosis not present

## 2019-12-02 DIAGNOSIS — D61818 Other pancytopenia: Secondary | ICD-10-CM | POA: Diagnosis not present

## 2019-12-02 DIAGNOSIS — R112 Nausea with vomiting, unspecified: Secondary | ICD-10-CM | POA: Diagnosis not present

## 2019-12-02 DIAGNOSIS — C3431 Malignant neoplasm of lower lobe, right bronchus or lung: Secondary | ICD-10-CM | POA: Diagnosis not present

## 2019-12-02 DIAGNOSIS — C7931 Secondary malignant neoplasm of brain: Secondary | ICD-10-CM

## 2019-12-02 DIAGNOSIS — Z5111 Encounter for antineoplastic chemotherapy: Secondary | ICD-10-CM | POA: Diagnosis not present

## 2019-12-02 DIAGNOSIS — J029 Acute pharyngitis, unspecified: Secondary | ICD-10-CM | POA: Diagnosis not present

## 2019-12-02 DIAGNOSIS — R42 Dizziness and giddiness: Secondary | ICD-10-CM | POA: Diagnosis not present

## 2019-12-02 MED ORDER — HEPARIN SOD (PORK) LOCK FLUSH 100 UNIT/ML IV SOLN
500.0000 [IU] | Freq: Once | INTRAVENOUS | Status: AC | PRN
  Administered 2019-12-02: 500 [IU]
  Filled 2019-12-02: qty 5

## 2019-12-02 MED ORDER — SODIUM CHLORIDE 0.9% FLUSH
10.0000 mL | INTRAVENOUS | Status: DC | PRN
  Administered 2019-12-02: 10 mL
  Filled 2019-12-02: qty 10

## 2019-12-02 MED ORDER — SODIUM CHLORIDE 0.9 % IV SOLN
10.0000 mg | Freq: Once | INTRAVENOUS | Status: AC
  Administered 2019-12-02: 10 mg via INTRAVENOUS
  Filled 2019-12-02: qty 10

## 2019-12-02 MED ORDER — SODIUM CHLORIDE 0.9 % IV SOLN
80.0000 mg/m2 | Freq: Once | INTRAVENOUS | Status: AC
  Administered 2019-12-02: 160 mg via INTRAVENOUS
  Filled 2019-12-02: qty 8

## 2019-12-02 MED ORDER — SODIUM CHLORIDE 0.9 % IV SOLN
Freq: Once | INTRAVENOUS | Status: AC
  Filled 2019-12-02: qty 250

## 2019-12-02 NOTE — Patient Instructions (Signed)
[facility] Discharge Instructions for Patients Receiving Chemotherapy  Today you received the following chemotherapy agents: etoposide  To help prevent nausea and vomiting after your treatment, we encourage you to take your nausea medication as directed.   If you develop nausea and vomiting that is not controlled by your nausea medication, call the clinic.   BELOW ARE SYMPTOMS THAT SHOULD BE REPORTED IMMEDIATELY:  *FEVER GREATER THAN 100.5 F  *CHILLS WITH OR WITHOUT FEVER  NAUSEA AND VOMITING THAT IS NOT CONTROLLED WITH YOUR NAUSEA MEDICATION  *UNUSUAL SHORTNESS OF BREATH  *UNUSUAL BRUISING OR BLEEDING  TENDERNESS IN MOUTH AND THROAT WITH OR WITHOUT PRESENCE OF ULCERS  *URINARY PROBLEMS  *BOWEL PROBLEMS  UNUSUAL RASH Items with * indicate a potential emergency and should be followed up as soon as possible.  Feel free to call the clinic should you have any questions or concerns. The clinic phone number is [phone].  Please show the CHEMO ALERT CARD at check-in to the Emergency Department and triage nurse.   

## 2019-12-02 NOTE — Telephone Encounter (Signed)
Scheduled per los. Called and left msg. Mailed printout  °

## 2019-12-03 ENCOUNTER — Other Ambulatory Visit: Payer: Self-pay

## 2019-12-03 ENCOUNTER — Inpatient Hospital Stay: Payer: Medicare PPO

## 2019-12-03 VITALS — BP 153/92 | HR 78 | Temp 98.3°F | Resp 18

## 2019-12-03 DIAGNOSIS — Z5111 Encounter for antineoplastic chemotherapy: Secondary | ICD-10-CM | POA: Diagnosis not present

## 2019-12-03 DIAGNOSIS — C3431 Malignant neoplasm of lower lobe, right bronchus or lung: Secondary | ICD-10-CM

## 2019-12-03 DIAGNOSIS — Z7689 Persons encountering health services in other specified circumstances: Secondary | ICD-10-CM | POA: Diagnosis not present

## 2019-12-03 DIAGNOSIS — R42 Dizziness and giddiness: Secondary | ICD-10-CM | POA: Diagnosis not present

## 2019-12-03 DIAGNOSIS — D61818 Other pancytopenia: Secondary | ICD-10-CM | POA: Diagnosis not present

## 2019-12-03 DIAGNOSIS — C7931 Secondary malignant neoplasm of brain: Secondary | ICD-10-CM | POA: Diagnosis not present

## 2019-12-03 DIAGNOSIS — R112 Nausea with vomiting, unspecified: Secondary | ICD-10-CM | POA: Diagnosis not present

## 2019-12-03 DIAGNOSIS — J029 Acute pharyngitis, unspecified: Secondary | ICD-10-CM | POA: Diagnosis not present

## 2019-12-03 DIAGNOSIS — Z5112 Encounter for antineoplastic immunotherapy: Secondary | ICD-10-CM | POA: Diagnosis not present

## 2019-12-03 MED ORDER — SODIUM CHLORIDE 0.9% FLUSH
10.0000 mL | INTRAVENOUS | Status: DC | PRN
  Administered 2019-12-03: 10 mL
  Filled 2019-12-03: qty 10

## 2019-12-03 MED ORDER — SODIUM CHLORIDE 0.9 % IV SOLN
Freq: Once | INTRAVENOUS | Status: AC
  Filled 2019-12-03: qty 250

## 2019-12-03 MED ORDER — PEGFILGRASTIM 6 MG/0.6ML ~~LOC~~ PSKT
PREFILLED_SYRINGE | SUBCUTANEOUS | Status: AC
  Filled 2019-12-03: qty 0.6

## 2019-12-03 MED ORDER — SODIUM CHLORIDE 0.9 % IV SOLN
80.0000 mg/m2 | Freq: Once | INTRAVENOUS | Status: AC
  Administered 2019-12-03: 160 mg via INTRAVENOUS
  Filled 2019-12-03: qty 8

## 2019-12-03 MED ORDER — HEPARIN SOD (PORK) LOCK FLUSH 100 UNIT/ML IV SOLN
500.0000 [IU] | Freq: Once | INTRAVENOUS | Status: AC | PRN
  Administered 2019-12-03: 500 [IU]
  Filled 2019-12-03: qty 5

## 2019-12-03 MED ORDER — SODIUM CHLORIDE 0.9 % IV SOLN
10.0000 mg | Freq: Once | INTRAVENOUS | Status: AC
  Administered 2019-12-03: 10 mg via INTRAVENOUS
  Filled 2019-12-03: qty 10

## 2019-12-03 MED ORDER — PEGFILGRASTIM 6 MG/0.6ML ~~LOC~~ PSKT
6.0000 mg | PREFILLED_SYRINGE | Freq: Once | SUBCUTANEOUS | Status: AC
  Administered 2019-12-03: 6 mg via SUBCUTANEOUS

## 2019-12-08 ENCOUNTER — Other Ambulatory Visit: Payer: Medicare PPO

## 2019-12-08 ENCOUNTER — Other Ambulatory Visit: Payer: Self-pay

## 2019-12-08 ENCOUNTER — Inpatient Hospital Stay: Payer: Medicare PPO

## 2019-12-08 DIAGNOSIS — R112 Nausea with vomiting, unspecified: Secondary | ICD-10-CM | POA: Diagnosis not present

## 2019-12-08 DIAGNOSIS — D61818 Other pancytopenia: Secondary | ICD-10-CM | POA: Diagnosis not present

## 2019-12-08 DIAGNOSIS — C3431 Malignant neoplasm of lower lobe, right bronchus or lung: Secondary | ICD-10-CM

## 2019-12-08 DIAGNOSIS — Z95828 Presence of other vascular implants and grafts: Secondary | ICD-10-CM

## 2019-12-08 DIAGNOSIS — Z7689 Persons encountering health services in other specified circumstances: Secondary | ICD-10-CM | POA: Diagnosis not present

## 2019-12-08 DIAGNOSIS — Z5112 Encounter for antineoplastic immunotherapy: Secondary | ICD-10-CM | POA: Diagnosis not present

## 2019-12-08 DIAGNOSIS — R42 Dizziness and giddiness: Secondary | ICD-10-CM | POA: Diagnosis not present

## 2019-12-08 DIAGNOSIS — J029 Acute pharyngitis, unspecified: Secondary | ICD-10-CM | POA: Diagnosis not present

## 2019-12-08 DIAGNOSIS — Z5111 Encounter for antineoplastic chemotherapy: Secondary | ICD-10-CM | POA: Diagnosis not present

## 2019-12-08 DIAGNOSIS — C7931 Secondary malignant neoplasm of brain: Secondary | ICD-10-CM | POA: Diagnosis not present

## 2019-12-08 LAB — CMP (CANCER CENTER ONLY)
ALT: 13 U/L (ref 0–44)
AST: 13 U/L — ABNORMAL LOW (ref 15–41)
Albumin: 3.3 g/dL — ABNORMAL LOW (ref 3.5–5.0)
Alkaline Phosphatase: 103 U/L (ref 38–126)
Anion gap: 8 (ref 5–15)
BUN: 21 mg/dL (ref 8–23)
CO2: 25 mmol/L (ref 22–32)
Calcium: 9 mg/dL (ref 8.9–10.3)
Chloride: 107 mmol/L (ref 98–111)
Creatinine: 1.2 mg/dL — ABNORMAL HIGH (ref 0.44–1.00)
GFR, Est AFR Am: 54 mL/min — ABNORMAL LOW (ref >60)
GFR, Estimated: 47 mL/min — ABNORMAL LOW (ref >60)
Glucose, Bld: 110 mg/dL — ABNORMAL HIGH (ref 70–99)
Potassium: 3.8 mmol/L (ref 3.5–5.1)
Sodium: 140 mmol/L (ref 135–145)
Total Bilirubin: 0.8 mg/dL (ref 0.3–1.2)
Total Protein: 6.1 g/dL — ABNORMAL LOW (ref 6.5–8.1)

## 2019-12-08 LAB — CBC WITH DIFFERENTIAL (CANCER CENTER ONLY)
Abs Immature Granulocytes: 0.11 10*3/uL — ABNORMAL HIGH (ref 0.00–0.07)
Basophils Absolute: 0.1 10*3/uL (ref 0.0–0.1)
Basophils Relative: 1 %
Eosinophils Absolute: 0 10*3/uL (ref 0.0–0.5)
Eosinophils Relative: 0 %
HCT: 28.8 % — ABNORMAL LOW (ref 36.0–46.0)
Hemoglobin: 9.3 g/dL — ABNORMAL LOW (ref 12.0–15.0)
Immature Granulocytes: 2 %
Lymphocytes Relative: 8 %
Lymphs Abs: 0.5 10*3/uL — ABNORMAL LOW (ref 0.7–4.0)
MCH: 31.5 pg (ref 26.0–34.0)
MCHC: 32.3 g/dL (ref 30.0–36.0)
MCV: 97.6 fL (ref 80.0–100.0)
Monocytes Absolute: 0.2 10*3/uL (ref 0.1–1.0)
Monocytes Relative: 3 %
Neutro Abs: 5.6 10*3/uL (ref 1.7–7.7)
Neutrophils Relative %: 86 %
Platelet Count: 109 10*3/uL — ABNORMAL LOW (ref 150–400)
RBC: 2.95 MIL/uL — ABNORMAL LOW (ref 3.87–5.11)
RDW: 15.1 % (ref 11.5–15.5)
WBC Count: 6.4 10*3/uL (ref 4.0–10.5)
nRBC: 0 % (ref 0.0–0.2)

## 2019-12-08 MED ORDER — SODIUM CHLORIDE 0.9% FLUSH
10.0000 mL | INTRAVENOUS | Status: DC | PRN
  Administered 2019-12-08: 10 mL
  Filled 2019-12-08: qty 10

## 2019-12-09 ENCOUNTER — Ambulatory Visit: Payer: Medicare PPO | Admitting: Family Medicine

## 2019-12-09 ENCOUNTER — Telehealth: Payer: Self-pay | Admitting: Family Medicine

## 2019-12-09 ENCOUNTER — Encounter: Payer: Self-pay | Admitting: Family Medicine

## 2019-12-09 VITALS — BP 101/66 | HR 107 | Temp 98.0°F | Ht 66.0 in | Wt 174.0 lb

## 2019-12-09 DIAGNOSIS — R350 Frequency of micturition: Secondary | ICD-10-CM | POA: Diagnosis not present

## 2019-12-09 DIAGNOSIS — G629 Polyneuropathy, unspecified: Secondary | ICD-10-CM | POA: Diagnosis not present

## 2019-12-09 DIAGNOSIS — H6123 Impacted cerumen, bilateral: Secondary | ICD-10-CM

## 2019-12-09 DIAGNOSIS — C7931 Secondary malignant neoplasm of brain: Secondary | ICD-10-CM | POA: Diagnosis not present

## 2019-12-09 DIAGNOSIS — N1831 Chronic kidney disease, stage 3a: Secondary | ICD-10-CM

## 2019-12-09 MED ORDER — TRAMADOL HCL 50 MG PO TABS: 50.0000 mg | ORAL_TABLET | Freq: Every evening | ORAL | 1 refills | Status: DC | PRN

## 2019-12-09 NOTE — Patient Instructions (Signed)
° ° ° °  If you have lab work done today you will be contacted with your lab results within the next 2 weeks.  If you have not heard from us then please contact us. The fastest way to get your results is to register for My Chart. ° ° °IF you received an x-ray today, you will receive an invoice from [facility]. Please contact [facility] at [phone] with questions or concerns regarding your invoice.  ° °IF you received labwork today, you will receive an invoice from [facility]. Please contact [facility] at [phone] with questions or concerns regarding your invoice.  ° °Our billing staff will not be able to assist you with questions regarding bills from these companies. ° °You will be contacted with the lab results as soon as they are available. The fastest way to get your results is to activate your My Chart account. Instructions are located on the last page of this paperwork. If you have not heard from us regarding the results in 2 weeks, please contact this office. °  ° ° ° °

## 2019-12-09 NOTE — Telephone Encounter (Signed)
Patient called to let us know that her husband dropped off Urine.Patient just checking to make sure it was dropped off/ checked with girls at front desk. They confirmed that urine had been dropped off and taken to lab

## 2019-12-09 NOTE — Progress Notes (Signed)
5/25/20218:15 AM  [name] Mar 10, 1951, 69 y.o., female [id number]  Chief Complaint  Patient presents with  . Back Pain    R sided mid back   . R shoulder pain  . Morning Sickness    vomiting / chemotherapy   . Urinary Frequency    HPI:   Patient is a 69 y.o. female with past medical history significant for HTN, CKD3, CVA in 06/2018, HLP, metastatic lung cancer to brain, UGIB with h/o transfusion, psoriasis,who presents today with several concerns  Patient undergoing chemotherapy for metastatic lung cancer Since she started having chemo having back pain, shoulder pain, vomiting, compazine has been helping control her vomiting Occasionally has diarrhea, immodium helps She denies any coffee ground emesis or blood in vomit Appetite is decreased Having stuffy ears with decreased hearing, no ringing Brings in BP log: takes amlodipine only if > 140/90 Continues to take tramadol as needed for pain, it is providing adequate pain relief, last ct scan April 2021 shows new sclerotic lesions along T5 and T6 Has also established with brain onc Completed brain radiation which has helped with cognitive function, possible gamma knife treatment, pending next MRI Patient having increased urination, no burning or hematuria Patient denies any abnormal bleeding Denies any cough, SOB, chest pain, palpitations  CBC Latest Ref Rng & Units 12/08/2019 12/01/2019 11/24/2019  WBC 4.0 - 10.5 K/uL 6.4 3.6(L) 5.7  Hemoglobin 12.0 - 15.0 g/dL 9.3(L) 9.3(L) 9.6(L)  Hematocrit 36.0 - 46.0 % 28.8(L) 29.1(L) 30.2(L)  Platelets 150 - 400 K/uL 109(L) 215 76(L)   Lab Results  Component Value Date   CREATININE 1.20 (H) 12/08/2019   CREATININE 1.36 (H) 12/01/2019   CREATININE 1.33 (H) 11/24/2019  GFR 47  Lab Results  Component Value Date   CREATININE 1.20 (H) 12/08/2019   BUN 21 12/08/2019   NA 140 12/08/2019   K 3.8 12/08/2019   CL 107 12/08/2019   CO2 25 12/08/2019    Depression screen PHQ  2/9 12/09/2019 10/07/2019 09/04/2019  Decreased Interest 0 0 0  Down, Depressed, Hopeless 0 0 0  PHQ - 2 Score 0 0 0  Some recent data might be hidden    Fall Risk  12/09/2019 10/13/2019 10/07/2019 09/04/2019 06/05/2019  Falls in the past year? 0 0 1 0 0  Number falls in past yr: 0 0 0 0 0  Injury with Fall? 0 0 0 0 0  Risk for fall due to : - Impaired balance/gait;Impaired mobility - - -  Follow up Falls evaluation completed - - - -     No Known Allergies  Prior to Admission medications   Medication Sig Start Date End Date Taking? Authorizing Provider  amLODipine (NORVASC) 10 MG tablet Take 1 tablet (10 mg total) by mouth daily as needed. Patient taking differently: Take 10 mg by mouth daily as needed. Take only if SBP >140 10/05/19  Yes Patrecia Pour, MD  atorvastatin (LIPITOR) 80 MG tablet TAKE 1 TABLET (80 MG TOTAL) BY MOUTH DAILY AT 6 PM. 07/24/19  Yes Rutherford Guys, MD  DULoxetine (CYMBALTA) 30 MG capsule TAKE 1 CAPSULE BY MOUTH EVERY DAY Patient taking differently: Take 30 mg by mouth daily.  04/25/19  Yes Rutherford Guys, MD  pregabalin (LYRICA) 50 MG capsule TAKE 1 CAPSULE 3 TIMES A DAY 11/03/19  Yes Rutherford Guys, MD  prochlorperazine (COMPAZINE) 10 MG tablet Take 1 tablet (10 mg total) by mouth every 6 (six) hours as needed for nausea or  vomiting. 10/27/19  Yes Curt Bears, MD  rivaroxaban (XARELTO) 20 MG TABS tablet Take 1 tablet (20 mg total) by mouth daily with supper. 08/27/19  Yes Curt Bears, MD  traMADol (ULTRAM) 50 MG tablet Take 1-2 tablets (50-100 mg total) by mouth at bedtime as needed. 09/04/19  Yes Rutherford Guys, MD  acetaminophen (TYLENOL) 650 MG CR tablet Take 650 mg by mouth every 8 (eight) hours as needed for pain.    [provider]  Clobetasol Prop Emollient Base (CLOBETASOL PROPIONATE E) 0.05 % emollient cream Apply 1 application topically 2 (two) times daily. Patient not taking: Reported on 12/09/2019 09/04/19   Rutherford Guys, MD    Past  Medical History:  Diagnosis Date  . Arthritis   . Chronic pain   . Hyperlipidemia   . Hypertension   . Low blood magnesium 10/04/2018  . lung ca dx'd 06/2018  . Stroke ([place])    06/19/2018 minor    Past Surgical History:  Procedure Laterality Date  . BIOPSY  06/19/2018   Procedure: BIOPSY;  Surgeon: Laurence Spates, MD;  [facility]: [facility];  Service: Endoscopy;;  . BIOPSY  10/11/2018   Procedure: BIOPSY;  Surgeon: Laurence Spates, MD;  Location: [facility];  Service: Endoscopy;;  . ENDARTERECTOMY Right 06/26/2018   Procedure: ENDARTERECTOMY CAROTID RIGHT;  Surgeon: Serafina Mitchell, MD;  Location: [facility] OR;  Service: Vascular;  Laterality: Right;  . ESOPHAGOGASTRODUODENOSCOPY (EGD) WITH PROPOFOL N/A 06/19/2018   Procedure: ESOPHAGOGASTRODUODENOSCOPY (EGD) WITH PROPOFOL;  Surgeon: Laurence Spates, MD;  [facility]: [facility];  Service: Endoscopy;  Laterality: N/A;  . FLEXIBLE SIGMOIDOSCOPY N/A 10/11/2018   Procedure: FLEXIBLE SIGMOIDOSCOPY;  Surgeon: Laurence Spates, MD;  [facility];  Service: Endoscopy;  Laterality: N/A;  . IR IMAGING GUIDED PORT INSERTION  10/07/2018  . PATCH ANGIOPLASTY Right 06/26/2018   Procedure: PATCH ANGIOPLASTY USING A XENOSURE 1cm x 6cm BIOLOGIC PATCH;  Surgeon: Serafina Mitchell, MD;  Location: [facility];  Service: Vascular;  Laterality: Right;  Marland Kitchen VIDEO BRONCHOSCOPY WITH ENDOBRONCHIAL ULTRASOUND N/A 08/14/2018   Procedure: VIDEO BRONCHOSCOPY WITH ENDOBRONCHIAL ULTRASOUND;  Surgeon: Collene Gobble, MD;  Location: [facility] OR;  Service: Thoracic;  Laterality: N/A;    Social History   Tobacco Use  . Smoking status: Former Smoker    Packs/day: 1.00    Years: 10.00    Pack years: 10.00    Types: Cigarettes    Quit date: 06/19/2018    Years since quitting: 1.4  . Smokeless tobacco: Never Used  . Tobacco comment: Currently using Nicorette Gum  Substance Use Topics  . Alcohol use: Yes    Comment: wine 2 glasses a day     Family History  Problem Relation Age  of Onset  . Cancer Mother        lung cancer  . Diabetes Other   . Hyperlipidemia Other   . Hypertension Other   . Cancer Other     ROS Per hpi  OBJECTIVE:  Today's Vitals   12/09/19 0803  BP: 101/66  Pulse: (!) 107  Temp: 98 F (36.7 C)  SpO2: 97%  Weight: 174 lb (78.9 kg)  Height: 5\' 6"  (1.676 m)   Body mass index is 28.08 kg/m.   Physical Exam Vitals and nursing note reviewed.  Constitutional:      Appearance: She is well-developed.  HENT:     Head: Normocephalic and atraumatic.     Right Ear: Hearing, tympanic membrane, ear canal and external ear normal. There is  impacted cerumen.     Left Ear: Hearing, tympanic membrane, ear canal and external ear normal. There is impacted cerumen.  Eyes:     Conjunctiva/sclera: Conjunctivae normal.     Pupils: Pupils are equal, round, and reactive to light.  Cardiovascular:     Rate and Rhythm: Normal rate and regular rhythm.     Heart sounds: Normal heart sounds. No murmur. No friction rub. No gallop.   Pulmonary:     Effort: Pulmonary effort is normal.     Breath sounds: Normal breath sounds. No wheezing or rales.  Musculoskeletal:     Cervical back: Neck supple.  Lymphadenopathy:     Cervical: No cervical adenopathy.  Skin:    General: Skin is warm and dry.  Neurological:     Mental Status: She is alert and oriented to person, place, and time.     Gait: Gait abnormal (unsteady).      No results found.   ASSESSMENT and PLAN  1. Metastatic small cell carcinoma to brain [facility]) Managed by oncology. Has good support at home. meds for management of sx are adequate. pmp reviewed. Tramadol refilled. Discussed pushing fluids and nutritional supplemental drinks. RTC precautions given.  2. Stage 3a chronic kidney disease crt improved, overall stable.   3. Frequent urination - POCT urinalysis dipstick - unable to provide sample, will bring back later.   4. Neuropathy - traMADol (ULTRAM) 50 MG tablet; Take 1-2  tablets (50-100 mg total) by mouth at bedtime as needed.  5. Bilateral impacted cerumen - Ear wax removal - successfully done by CMA.   Return in about 2 months (around 02/08/2020).    Rutherford Guys, MD [facility] at [facility] [place], [place] [address] Ph.  [phone] Fax [phone]

## 2019-12-12 ENCOUNTER — Telehealth: Payer: Self-pay | Admitting: Family Medicine

## 2019-12-12 NOTE — Telephone Encounter (Signed)
Pt is looking for results from a urine sample she dropped off, states her back is very painful. I don not see any UA or culture results I do see a canceled POCT dipstick. What should I tell the pt?

## 2019-12-12 NOTE — Telephone Encounter (Signed)
Pt called and is wanting to know the results, pt is wanting a call because of the back pain she is still having. [phone]  Please advise.

## 2019-12-12 NOTE — Telephone Encounter (Signed)
Results of the urine she dropped off

## 2019-12-14 NOTE — Telephone Encounter (Signed)
I left a voicemail apologizing as I have no idea what happened. In my note it was very clear that she would come back to leave urine sample. I have instructed her to go to urgent care if she thinks it is a UTI, if not then she can come into clinic on Tuesday June 1st for lab visit to have a POCT UA run and I can look at results prior to letting her go, so further decisions can be made. Thanks.

## 2019-12-15 ENCOUNTER — Encounter: Payer: Self-pay | Admitting: Family Medicine

## 2019-12-15 ENCOUNTER — Encounter: Payer: Self-pay | Admitting: Internal Medicine

## 2019-12-16 ENCOUNTER — Other Ambulatory Visit: Payer: Medicare PPO

## 2019-12-16 ENCOUNTER — Other Ambulatory Visit: Payer: Self-pay | Admitting: Medical Oncology

## 2019-12-16 ENCOUNTER — Ambulatory Visit: Payer: Medicare PPO

## 2019-12-16 ENCOUNTER — Telehealth: Payer: Self-pay | Admitting: Medical Oncology

## 2019-12-16 ENCOUNTER — Telehealth: Payer: Self-pay | Admitting: *Deleted

## 2019-12-16 ENCOUNTER — Inpatient Hospital Stay: Payer: Medicare PPO

## 2019-12-16 ENCOUNTER — Ambulatory Visit: Payer: Medicare PPO | Admitting: Internal Medicine

## 2019-12-16 ENCOUNTER — Inpatient Hospital Stay: Payer: Medicare PPO | Attending: Internal Medicine

## 2019-12-16 ENCOUNTER — Other Ambulatory Visit: Payer: Self-pay | Admitting: Internal Medicine

## 2019-12-16 ENCOUNTER — Other Ambulatory Visit: Payer: Self-pay

## 2019-12-16 DIAGNOSIS — I2510 Atherosclerotic heart disease of native coronary artery without angina pectoris: Secondary | ICD-10-CM | POA: Insufficient documentation

## 2019-12-16 DIAGNOSIS — N289 Disorder of kidney and ureter, unspecified: Secondary | ICD-10-CM | POA: Diagnosis not present

## 2019-12-16 DIAGNOSIS — C7951 Secondary malignant neoplasm of bone: Secondary | ICD-10-CM | POA: Insufficient documentation

## 2019-12-16 DIAGNOSIS — C3431 Malignant neoplasm of lower lobe, right bronchus or lung: Secondary | ICD-10-CM | POA: Insufficient documentation

## 2019-12-16 DIAGNOSIS — D6481 Anemia due to antineoplastic chemotherapy: Secondary | ICD-10-CM | POA: Insufficient documentation

## 2019-12-16 DIAGNOSIS — Z7901 Long term (current) use of anticoagulants: Secondary | ICD-10-CM | POA: Diagnosis not present

## 2019-12-16 DIAGNOSIS — Z5111 Encounter for antineoplastic chemotherapy: Secondary | ICD-10-CM | POA: Insufficient documentation

## 2019-12-16 DIAGNOSIS — Z7689 Persons encountering health services in other specified circumstances: Secondary | ICD-10-CM | POA: Insufficient documentation

## 2019-12-16 DIAGNOSIS — Z5112 Encounter for antineoplastic immunotherapy: Secondary | ICD-10-CM | POA: Diagnosis not present

## 2019-12-16 DIAGNOSIS — Z79899 Other long term (current) drug therapy: Secondary | ICD-10-CM | POA: Diagnosis not present

## 2019-12-16 DIAGNOSIS — E785 Hyperlipidemia, unspecified: Secondary | ICD-10-CM | POA: Diagnosis not present

## 2019-12-16 DIAGNOSIS — M1990 Unspecified osteoarthritis, unspecified site: Secondary | ICD-10-CM | POA: Insufficient documentation

## 2019-12-16 DIAGNOSIS — C7931 Secondary malignant neoplasm of brain: Secondary | ICD-10-CM | POA: Insufficient documentation

## 2019-12-16 DIAGNOSIS — R5383 Other fatigue: Secondary | ICD-10-CM | POA: Diagnosis not present

## 2019-12-16 DIAGNOSIS — I10 Essential (primary) hypertension: Secondary | ICD-10-CM | POA: Insufficient documentation

## 2019-12-16 DIAGNOSIS — Z8673 Personal history of transient ischemic attack (TIA), and cerebral infarction without residual deficits: Secondary | ICD-10-CM | POA: Insufficient documentation

## 2019-12-16 DIAGNOSIS — E876 Hypokalemia: Secondary | ICD-10-CM

## 2019-12-16 DIAGNOSIS — T451X5A Adverse effect of antineoplastic and immunosuppressive drugs, initial encounter: Secondary | ICD-10-CM | POA: Insufficient documentation

## 2019-12-16 LAB — CBC WITH DIFFERENTIAL (CANCER CENTER ONLY)
Abs Immature Granulocytes: 0.24 10*3/uL — ABNORMAL HIGH (ref 0.00–0.07)
Basophils Absolute: 0 10*3/uL (ref 0.0–0.1)
Basophils Relative: 0 %
Eosinophils Absolute: 0 10*3/uL (ref 0.0–0.5)
Eosinophils Relative: 0 %
HCT: 23.5 % — ABNORMAL LOW (ref 36.0–46.0)
Hemoglobin: 7.7 g/dL — ABNORMAL LOW (ref 12.0–15.0)
Immature Granulocytes: 3 %
Lymphocytes Relative: 8 %
Lymphs Abs: 0.6 10*3/uL — ABNORMAL LOW (ref 0.7–4.0)
MCH: 32.2 pg (ref 26.0–34.0)
MCHC: 32.8 g/dL (ref 30.0–36.0)
MCV: 98.3 fL (ref 80.0–100.0)
Monocytes Absolute: 0.8 10*3/uL (ref 0.1–1.0)
Monocytes Relative: 11 %
Neutro Abs: 5.4 10*3/uL (ref 1.7–7.7)
Neutrophils Relative %: 78 %
Platelet Count: 27 10*3/uL — ABNORMAL LOW (ref 150–400)
RBC: 2.39 MIL/uL — ABNORMAL LOW (ref 3.87–5.11)
RDW: 14.5 % (ref 11.5–15.5)
WBC Count: 7.1 10*3/uL (ref 4.0–10.5)
nRBC: 0 % (ref 0.0–0.2)

## 2019-12-16 LAB — CMP (CANCER CENTER ONLY)
ALT: 16 U/L (ref 0–44)
AST: 14 U/L — ABNORMAL LOW (ref 15–41)
Albumin: 3.3 g/dL — ABNORMAL LOW (ref 3.5–5.0)
Alkaline Phosphatase: 87 U/L (ref 38–126)
Anion gap: 13 (ref 5–15)
BUN: 15 mg/dL (ref 8–23)
CO2: 21 mmol/L — ABNORMAL LOW (ref 22–32)
Calcium: 9.1 mg/dL (ref 8.9–10.3)
Chloride: 107 mmol/L (ref 98–111)
Creatinine: 1.28 mg/dL — ABNORMAL HIGH (ref 0.44–1.00)
GFR, Est AFR Am: 50 mL/min — ABNORMAL LOW (ref >60)
GFR, Estimated: 43 mL/min — ABNORMAL LOW (ref >60)
Glucose, Bld: 96 mg/dL (ref 70–99)
Potassium: 3.1 mmol/L — ABNORMAL LOW (ref 3.5–5.1)
Sodium: 141 mmol/L (ref 135–145)
Total Bilirubin: 0.3 mg/dL (ref 0.3–1.2)
Total Protein: 5.9 g/dL — ABNORMAL LOW (ref 6.5–8.1)

## 2019-12-16 LAB — TSH: TSH: 0.037 u[IU]/mL — ABNORMAL LOW (ref 0.350–4.500)

## 2019-12-16 MED ORDER — DEXAMETHASONE 4 MG PO TABS
4.0000 mg | ORAL_TABLET | Freq: Every day | ORAL | 0 refills | Status: DC
Start: 2019-12-16 — End: 2019-12-22

## 2019-12-16 MED ORDER — POTASSIUM CHLORIDE CRYS ER 20 MEQ PO TBCR: 20.0000 meq | EXTENDED_RELEASE_TABLET | Freq: Every day | ORAL | 0 refills | Status: DC

## 2019-12-16 NOTE — Telephone Encounter (Signed)
Done

## 2019-12-16 NOTE — Telephone Encounter (Signed)
-----   Message from Curt Bears, MD sent at 12/16/2019  1:41 PM EDT ----- K Dur 20 meq po qd x 7 days and please arrange for 2 units of PRBCs this week. Thank you. ----- Message ----- From: Buel Ream, Lab In [place] Sent: 12/16/2019  11:59 AM EDT To: Curt Bears, MD

## 2019-12-16 NOTE — Telephone Encounter (Signed)
Regarding patient my chart message, routed to Dr. Mickeal Skinner.  He advised he would like to order a short course week long dose of Decardron for blurred vision/dizziness and then follow up next Monday to see how it helped.  Patient made aware and follow up scheduled.

## 2019-12-16 NOTE — Telephone Encounter (Signed)
LVM for pt to F/U with her UTI. I asked her to come in and give a UA so that Dr. Pamella Pert can look at it and address any problems from there.

## 2019-12-17 ENCOUNTER — Ambulatory Visit: Payer: Medicare PPO

## 2019-12-17 ENCOUNTER — Other Ambulatory Visit: Payer: Self-pay | Admitting: Medical Oncology

## 2019-12-17 ENCOUNTER — Telehealth: Payer: Self-pay | Admitting: Medical Oncology

## 2019-12-17 DIAGNOSIS — D649 Anemia, unspecified: Secondary | ICD-10-CM

## 2019-12-17 NOTE — Progress Notes (Signed)
Pharmacist Chemotherapy Monitoring - Follow Up Assessment    I verify that I have reviewed each item in the below checklist:  . Regimen for the patient is scheduled for the appropriate day and plan matches scheduled date. Marland Kitchen Appropriate non-routine labs are ordered dependent on drug ordered. . If applicable, additional medications reviewed and ordered per protocol based on lifetime cumulative doses and/or treatment regimen.   Plan for follow-up and/or issues identified: No . I-vent associated with next due treatment: No . MD and/or nursing notified: No  Isaak Delmundo D 12/17/2019 3:58 PM

## 2019-12-17 NOTE — Telephone Encounter (Signed)
Asking about prep for scans. I told husband she does not need prep. appt pending for blood transfusion.

## 2019-12-18 ENCOUNTER — Inpatient Hospital Stay: Payer: Medicare PPO

## 2019-12-18 ENCOUNTER — Encounter (HOSPITAL_COMMUNITY): Payer: Self-pay

## 2019-12-18 ENCOUNTER — Other Ambulatory Visit: Payer: Self-pay

## 2019-12-18 ENCOUNTER — Ambulatory Visit (HOSPITAL_COMMUNITY)
Admission: RE | Admit: 2019-12-18 | Discharge: 2019-12-18 | Disposition: A | Discharge status: Home / Self Care | Payer: Medicare PPO | Source: Ambulatory Visit | Attending: Internal Medicine | Admitting: Internal Medicine

## 2019-12-18 ENCOUNTER — Ambulatory Visit: Payer: Medicare PPO

## 2019-12-18 DIAGNOSIS — C7931 Secondary malignant neoplasm of brain: Secondary | ICD-10-CM | POA: Diagnosis not present

## 2019-12-18 DIAGNOSIS — Z5112 Encounter for antineoplastic immunotherapy: Secondary | ICD-10-CM | POA: Diagnosis not present

## 2019-12-18 DIAGNOSIS — R5383 Other fatigue: Secondary | ICD-10-CM | POA: Diagnosis not present

## 2019-12-18 DIAGNOSIS — D6481 Anemia due to antineoplastic chemotherapy: Secondary | ICD-10-CM | POA: Diagnosis not present

## 2019-12-18 DIAGNOSIS — C7951 Secondary malignant neoplasm of bone: Secondary | ICD-10-CM | POA: Diagnosis not present

## 2019-12-18 DIAGNOSIS — T451X5A Adverse effect of antineoplastic and immunosuppressive drugs, initial encounter: Secondary | ICD-10-CM | POA: Diagnosis not present

## 2019-12-18 DIAGNOSIS — C3431 Malignant neoplasm of lower lobe, right bronchus or lung: Secondary | ICD-10-CM | POA: Diagnosis not present

## 2019-12-18 DIAGNOSIS — C3490 Malignant neoplasm of unspecified part of unspecified bronchus or lung: Secondary | ICD-10-CM | POA: Diagnosis not present

## 2019-12-18 DIAGNOSIS — D649 Anemia, unspecified: Secondary | ICD-10-CM

## 2019-12-18 DIAGNOSIS — Z95828 Presence of other vascular implants and grafts: Secondary | ICD-10-CM

## 2019-12-18 DIAGNOSIS — Z5111 Encounter for antineoplastic chemotherapy: Secondary | ICD-10-CM | POA: Diagnosis not present

## 2019-12-18 DIAGNOSIS — Z7689 Persons encountering health services in other specified circumstances: Secondary | ICD-10-CM | POA: Diagnosis not present

## 2019-12-18 MED ORDER — HEPARIN SOD (PORK) LOCK FLUSH 100 UNIT/ML IV SOLN: 500.0000 [IU] | Freq: Once | INTRAVENOUS | Status: DC

## 2019-12-18 MED ORDER — SODIUM CHLORIDE 0.9% FLUSH
10.0000 mL | INTRAVENOUS | Status: DC | PRN
  Administered 2019-12-18: 10 mL
  Filled 2019-12-18: qty 10

## 2019-12-18 MED ORDER — HEPARIN SOD (PORK) LOCK FLUSH 100 UNIT/ML IV SOLN
INTRAVENOUS | Status: AC
  Filled 2019-12-18: qty 5

## 2019-12-19 ENCOUNTER — Other Ambulatory Visit: Payer: Self-pay | Admitting: Medical Oncology

## 2019-12-19 DIAGNOSIS — D649 Anemia, unspecified: Secondary | ICD-10-CM

## 2019-12-19 LAB — PREPARE RBC (CROSSMATCH)

## 2019-12-20 ENCOUNTER — Ambulatory Visit: Payer: Medicare PPO

## 2019-12-20 ENCOUNTER — Other Ambulatory Visit: Payer: Self-pay | Admitting: Internal Medicine

## 2019-12-20 ENCOUNTER — Inpatient Hospital Stay: Payer: Medicare PPO

## 2019-12-20 ENCOUNTER — Other Ambulatory Visit: Payer: Self-pay

## 2019-12-20 DIAGNOSIS — D6481 Anemia due to antineoplastic chemotherapy: Secondary | ICD-10-CM | POA: Diagnosis not present

## 2019-12-20 DIAGNOSIS — C7931 Secondary malignant neoplasm of brain: Secondary | ICD-10-CM | POA: Diagnosis not present

## 2019-12-20 DIAGNOSIS — Z5112 Encounter for antineoplastic immunotherapy: Secondary | ICD-10-CM | POA: Diagnosis not present

## 2019-12-20 DIAGNOSIS — T451X5A Adverse effect of antineoplastic and immunosuppressive drugs, initial encounter: Secondary | ICD-10-CM | POA: Diagnosis not present

## 2019-12-20 DIAGNOSIS — C3431 Malignant neoplasm of lower lobe, right bronchus or lung: Secondary | ICD-10-CM | POA: Diagnosis not present

## 2019-12-20 DIAGNOSIS — Z7689 Persons encountering health services in other specified circumstances: Secondary | ICD-10-CM | POA: Diagnosis not present

## 2019-12-20 DIAGNOSIS — R5383 Other fatigue: Secondary | ICD-10-CM | POA: Diagnosis not present

## 2019-12-20 DIAGNOSIS — Z5111 Encounter for antineoplastic chemotherapy: Secondary | ICD-10-CM | POA: Diagnosis not present

## 2019-12-20 DIAGNOSIS — C7951 Secondary malignant neoplasm of bone: Secondary | ICD-10-CM | POA: Diagnosis not present

## 2019-12-20 DIAGNOSIS — D649 Anemia, unspecified: Secondary | ICD-10-CM

## 2019-12-20 MED ORDER — HEPARIN SOD (PORK) LOCK FLUSH 100 UNIT/ML IV SOLN
500.0000 [IU] | Freq: Every day | INTRAVENOUS | Status: AC | PRN
  Administered 2019-12-20: 500 [IU]
  Filled 2019-12-20: qty 5

## 2019-12-20 MED ORDER — ACETAMINOPHEN 325 MG PO TABS
650.0000 mg | ORAL_TABLET | Freq: Once | ORAL | Status: AC
  Administered 2019-12-20: 650 mg via ORAL

## 2019-12-20 MED ORDER — SODIUM CHLORIDE 0.9% IV SOLUTION
250.0000 mL | Freq: Once | INTRAVENOUS | Status: AC
  Administered 2019-12-20: 250 mL via INTRAVENOUS
  Filled 2019-12-20: qty 250

## 2019-12-20 MED ORDER — DIPHENHYDRAMINE HCL 25 MG PO CAPS
25.0000 mg | ORAL_CAPSULE | Freq: Once | ORAL | Status: AC
  Administered 2019-12-20: 25 mg via ORAL

## 2019-12-20 MED ORDER — DIPHENHYDRAMINE HCL 25 MG PO CAPS
ORAL_CAPSULE | ORAL | Status: AC
  Filled 2019-12-20: qty 1

## 2019-12-20 MED ORDER — ACETAMINOPHEN 325 MG PO TABS
ORAL_TABLET | ORAL | Status: AC
  Filled 2019-12-20: qty 2

## 2019-12-20 MED ORDER — SODIUM CHLORIDE 0.9% FLUSH
10.0000 mL | INTRAVENOUS | Status: AC | PRN
  Administered 2019-12-20: 10 mL
  Filled 2019-12-20: qty 10

## 2019-12-20 NOTE — Patient Instructions (Signed)

## 2019-12-21 LAB — BPAM RBC
Blood Product Expiration Date: 202106152359
Blood Product Expiration Date: 202106182359
ISSUE DATE / TIME: 202106050817
ISSUE DATE / TIME: 202106050817
Unit Type and Rh: 6200
Unit Type and Rh: 6200

## 2019-12-21 LAB — TYPE AND SCREEN
ABO/RH(D): A POS
Antibody Screen: NEGATIVE
Unit division: 0
Unit division: 0

## 2019-12-22 ENCOUNTER — Encounter: Payer: Self-pay | Admitting: Internal Medicine

## 2019-12-22 ENCOUNTER — Inpatient Hospital Stay: Payer: Medicare PPO

## 2019-12-22 ENCOUNTER — Inpatient Hospital Stay: Payer: Medicare PPO | Admitting: Internal Medicine

## 2019-12-22 ENCOUNTER — Inpatient Hospital Stay (HOSPITAL_BASED_OUTPATIENT_CLINIC_OR_DEPARTMENT_OTHER): Payer: Medicare PPO | Admitting: Internal Medicine

## 2019-12-22 ENCOUNTER — Other Ambulatory Visit: Payer: Self-pay

## 2019-12-22 VITALS — BP 159/101 | HR 91 | Temp 97.7°F | Resp 18 | Ht 66.0 in | Wt 179.7 lb

## 2019-12-22 VITALS — BP 153/94 | HR 85 | Temp 97.9°F | Resp 18 | Ht 66.0 in | Wt 179.2 lb

## 2019-12-22 DIAGNOSIS — C7931 Secondary malignant neoplasm of brain: Secondary | ICD-10-CM

## 2019-12-22 DIAGNOSIS — Z5112 Encounter for antineoplastic immunotherapy: Secondary | ICD-10-CM

## 2019-12-22 DIAGNOSIS — T451X5A Adverse effect of antineoplastic and immunosuppressive drugs, initial encounter: Secondary | ICD-10-CM | POA: Diagnosis not present

## 2019-12-22 DIAGNOSIS — C7951 Secondary malignant neoplasm of bone: Secondary | ICD-10-CM | POA: Diagnosis not present

## 2019-12-22 DIAGNOSIS — Z5111 Encounter for antineoplastic chemotherapy: Secondary | ICD-10-CM | POA: Diagnosis not present

## 2019-12-22 DIAGNOSIS — Z7689 Persons encountering health services in other specified circumstances: Secondary | ICD-10-CM | POA: Diagnosis not present

## 2019-12-22 DIAGNOSIS — C3431 Malignant neoplasm of lower lobe, right bronchus or lung: Secondary | ICD-10-CM

## 2019-12-22 DIAGNOSIS — I10 Essential (primary) hypertension: Secondary | ICD-10-CM

## 2019-12-22 DIAGNOSIS — D649 Anemia, unspecified: Secondary | ICD-10-CM | POA: Diagnosis not present

## 2019-12-22 DIAGNOSIS — D6481 Anemia due to antineoplastic chemotherapy: Secondary | ICD-10-CM | POA: Diagnosis not present

## 2019-12-22 DIAGNOSIS — R5383 Other fatigue: Secondary | ICD-10-CM | POA: Diagnosis not present

## 2019-12-22 LAB — CMP (CANCER CENTER ONLY)
ALT: 23 U/L (ref 0–44)
AST: 15 U/L (ref 15–41)
Albumin: 3.3 g/dL — ABNORMAL LOW (ref 3.5–5.0)
Alkaline Phosphatase: 75 U/L (ref 38–126)
Anion gap: 10 (ref 5–15)
BUN: 20 mg/dL (ref 8–23)
CO2: 23 mmol/L (ref 22–32)
Calcium: 8.8 mg/dL — ABNORMAL LOW (ref 8.9–10.3)
Chloride: 109 mmol/L (ref 98–111)
Creatinine: 1.19 mg/dL — ABNORMAL HIGH (ref 0.44–1.00)
GFR, Est AFR Am: 55 mL/min — ABNORMAL LOW (ref >60)
GFR, Estimated: 47 mL/min — ABNORMAL LOW (ref >60)
Glucose, Bld: 88 mg/dL (ref 70–99)
Potassium: 3.2 mmol/L — ABNORMAL LOW (ref 3.5–5.1)
Sodium: 142 mmol/L (ref 135–145)
Total Bilirubin: 0.5 mg/dL (ref 0.3–1.2)
Total Protein: 5.7 g/dL — ABNORMAL LOW (ref 6.5–8.1)

## 2019-12-22 LAB — CBC WITH DIFFERENTIAL (CANCER CENTER ONLY)
Abs Immature Granulocytes: 0.06 10*3/uL (ref 0.00–0.07)
Basophils Absolute: 0 10*3/uL (ref 0.0–0.1)
Basophils Relative: 0 %
Eosinophils Absolute: 0 10*3/uL (ref 0.0–0.5)
Eosinophils Relative: 0 %
HCT: 30.3 % — ABNORMAL LOW (ref 36.0–46.0)
Hemoglobin: 9.9 g/dL — ABNORMAL LOW (ref 12.0–15.0)
Immature Granulocytes: 1 %
Lymphocytes Relative: 8 %
Lymphs Abs: 0.7 10*3/uL (ref 0.7–4.0)
MCH: 32.1 pg (ref 26.0–34.0)
MCHC: 32.7 g/dL (ref 30.0–36.0)
MCV: 98.4 fL (ref 80.0–100.0)
Monocytes Absolute: 0.8 10*3/uL (ref 0.1–1.0)
Monocytes Relative: 10 %
Neutro Abs: 6.7 10*3/uL (ref 1.7–7.7)
Neutrophils Relative %: 81 %
Platelet Count: 117 10*3/uL — ABNORMAL LOW (ref 150–400)
RBC: 3.08 MIL/uL — ABNORMAL LOW (ref 3.87–5.11)
RDW: 16.6 % — ABNORMAL HIGH (ref 11.5–15.5)
WBC Count: 8.2 10*3/uL (ref 4.0–10.5)
nRBC: 0 % (ref 0.0–0.2)

## 2019-12-22 MED ORDER — SODIUM CHLORIDE 0.9 % IV SOLN
Freq: Once | INTRAVENOUS | Status: AC
  Filled 2019-12-22: qty 250

## 2019-12-22 MED ORDER — HEPARIN SOD (PORK) LOCK FLUSH 100 UNIT/ML IV SOLN
500.0000 [IU] | Freq: Once | INTRAVENOUS | Status: AC | PRN
  Administered 2019-12-22: 500 [IU]
  Filled 2019-12-22: qty 5

## 2019-12-22 MED ORDER — SODIUM CHLORIDE 0.9 % IV SOLN
150.0000 mg | Freq: Once | INTRAVENOUS | Status: AC
  Administered 2019-12-22: 150 mg via INTRAVENOUS
  Filled 2019-12-22: qty 150

## 2019-12-22 MED ORDER — PALONOSETRON HCL INJECTION 0.25 MG/5ML
0.2500 mg | Freq: Once | INTRAVENOUS | Status: AC
  Administered 2019-12-22: 0.25 mg via INTRAVENOUS

## 2019-12-22 MED ORDER — DEXAMETHASONE 1 MG PO TABS
ORAL_TABLET | ORAL | 0 refills | Status: DC
Start: 2019-12-22 — End: 2020-01-20

## 2019-12-22 MED ORDER — SODIUM CHLORIDE 0.9 % IV SOLN
1500.0000 mg | Freq: Once | INTRAVENOUS | Status: AC
  Administered 2019-12-22: 1500 mg via INTRAVENOUS
  Filled 2019-12-22: qty 30

## 2019-12-22 MED ORDER — SODIUM CHLORIDE 0.9% FLUSH
10.0000 mL | INTRAVENOUS | Status: DC | PRN
  Administered 2019-12-22: 10 mL
  Filled 2019-12-22: qty 10

## 2019-12-22 MED ORDER — SODIUM CHLORIDE 0.9 % IV SOLN
324.0000 mg | Freq: Once | INTRAVENOUS | Status: AC
  Administered 2019-12-22: 320 mg via INTRAVENOUS
  Filled 2019-12-22: qty 32

## 2019-12-22 MED ORDER — SODIUM CHLORIDE 0.9 % IV SOLN
80.0000 mg/m2 | Freq: Once | INTRAVENOUS | Status: AC
  Administered 2019-12-22: 160 mg via INTRAVENOUS
  Filled 2019-12-22: qty 8

## 2019-12-22 MED ORDER — SODIUM CHLORIDE 0.9 % IV SOLN
10.0000 mg | Freq: Once | INTRAVENOUS | Status: AC
  Administered 2019-12-22: 10 mg via INTRAVENOUS
  Filled 2019-12-22: qty 10

## 2019-12-22 MED ORDER — PALONOSETRON HCL INJECTION 0.25 MG/5ML
INTRAVENOUS | Status: AC
  Filled 2019-12-22: qty 5

## 2019-12-22 NOTE — Patient Instructions (Signed)
[facility] Discharge Instructions for Patients Receiving Chemotherapy  Today you received the following chemotherapy agents: Imfinzi/Carboplatin/Etoposide  To help prevent nausea and vomiting after your treatment, we encourage you to take your nausea medication as directed.   If you develop nausea and vomiting that is not controlled by your nausea medication, call the clinic.   BELOW ARE SYMPTOMS THAT SHOULD BE REPORTED IMMEDIATELY:  *FEVER GREATER THAN 100.5 F  *CHILLS WITH OR WITHOUT FEVER  NAUSEA AND VOMITING THAT IS NOT CONTROLLED WITH YOUR NAUSEA MEDICATION  *UNUSUAL SHORTNESS OF BREATH  *UNUSUAL BRUISING OR BLEEDING  TENDERNESS IN MOUTH AND THROAT WITH OR WITHOUT PRESENCE OF ULCERS  *URINARY PROBLEMS  *BOWEL PROBLEMS  UNUSUAL RASH Items with * indicate a potential emergency and should be followed up as soon as possible.  Feel free to call the clinic should you have any questions or concerns. The clinic phone number is [phone].  Please show the [place] at check-in to the Emergency Department and triage nurse.

## 2019-12-22 NOTE — Progress Notes (Signed)
[facility] [place], [address] [address] [phone]   Interval Evaluation  Date of Service: 12/22/19 Patient Name: [name] Patient MRN: [id number] Patient DOB: [birth year] Provider: Ventura Sellers, MD  Identifying Statement:  [name] is a 69 y.o. female with Metastatic small cell carcinoma to brain [facility]) [C79.31]   Primary Cancer:  Oncologic History: Oncology History  Small cell carcinoma of lower lobe of right lung ([place])  08/28/2018 Initial Diagnosis   Small cell carcinoma of lower lobe of right lung ([place])   09/03/2018 - 12/22/2018 Chemotherapy   The patient had palonosetron (ALOXI) injection 0.25 mg, 0.25 mg, Intravenous,  Once, 5 of 6 cycles Administration: 0.25 mg (09/03/2018), 0.25 mg (12/02/2018), 0.25 mg (09/25/2018), 0.25 mg (10/16/2018), 0.25 mg (11/12/2018) pegfilgrastim (NEULASTA ONPRO KIT) injection 6 mg, 6 mg, Subcutaneous, Once, 4 of 5 cycles Administration: 6 mg (09/27/2018), 6 mg (10/18/2018), 6 mg (12/04/2018), 6 mg (11/14/2018) CISplatin (PLATINOL) 165 mg in sodium chloride 0.9 % 500 mL chemo infusion, 80 mg/m2 = 165 mg, Intravenous,  Once, 5 of 6 cycles Dose modification: 40 mg/m2 (original dose 80 mg/m2, Cycle 5, Reason: Change in SCr/CrCl) Administration: 165 mg (09/03/2018), 82 mg (12/02/2018), 165 mg (09/25/2018), 165 mg (10/16/2018), 82 mg (11/12/2018) etoposide (VEPESID) 200 mg in sodium chloride 0.9 % 500 mL chemo infusion, 97 mg/m2 = 210 mg, Intravenous,  Once, 5 of 6 cycles Dose modification: 80 mg/m2 (original dose 100 mg/m2, Cycle 5, Reason: Change in SCr/CrCl) Administration: 200 mg (09/03/2018), 200 mg (09/04/2018), 200 mg (09/05/2018), 160 mg (12/02/2018), 160 mg (12/03/2018), 160 mg (12/04/2018), 200 mg (09/25/2018), 200 mg (09/26/2018), 200 mg (09/27/2018), 200 mg (10/16/2018), 200 mg (10/17/2018), 200 mg (10/18/2018), 160 mg (11/12/2018), 160 mg (11/13/2018), 160 mg (11/14/2018) fosaprepitant (EMEND) 150 mg,  dexamethasone (DECADRON) 12 mg in sodium chloride 0.9 % 145 mL IVPB, , Intravenous,  Once, 5 of 6 cycles Administration:  (09/03/2018),  (12/02/2018),  (09/25/2018),  (10/16/2018),  (11/12/2018)  for chemotherapy treatment.    11/03/2019 -  Chemotherapy   The patient had palonosetron (ALOXI) injection 0.25 mg, 0.25 mg, Intravenous,  Once, 2 of 4 cycles Administration: 0.25 mg (11/03/2019), 0.25 mg (12/01/2019) pegfilgrastim (NEULASTA ONPRO KIT) injection 6 mg, 6 mg, Subcutaneous, Once, 2 of 4 cycles Administration: 6 mg (11/05/2019), 6 mg (12/03/2019) CARBOplatin (PARAPLATIN) 390 mg in sodium chloride 0.9 % 250 mL chemo infusion, 390 mg (100 % of original dose 389 mg), Intravenous,  Once, 2 of 4 cycles Dose modification: 389 mg (original dose 389 mg, Cycle 1), 324 mg (original dose 324 mg, Cycle 3) Administration: 390 mg (11/03/2019), 320 mg (12/01/2019) etoposide (VEPESID) 200 mg in sodium chloride 0.9 % 500 mL chemo infusion, 100 mg/m2 = 200 mg, Intravenous,  Once, 2 of 4 cycles Dose modification: 80 mg/m2 (original dose 100 mg/m2, Cycle 3, Reason: Dose not tolerated) Administration: 200 mg (11/03/2019), 200 mg (11/04/2019), 200 mg (11/05/2019), 160 mg (12/01/2019), 160 mg (12/02/2019), 160 mg (12/03/2019) fosaprepitant (EMEND) 150 mg in sodium chloride 0.9 % 145 mL IVPB, 150 mg, Intravenous,  Once, 2 of 4 cycles Administration: 150 mg (11/03/2019), 150 mg (12/01/2019) durvalumab (IMFINZI) 1,500 mg in sodium chloride 0.9 % 100 mL chemo infusion, 1,500 mg, Intravenous,  Once, 2 of 8 cycles Administration: 1,500 mg (11/03/2019), 1,500 mg (12/01/2019)  for chemotherapy treatment.    Metastatic small cell carcinoma to brain ([place])  10/07/2019 Initial Diagnosis   Metastatic small cell carcinoma to brain ([place])  11/03/2019 -  Chemotherapy   The patient had palonosetron (ALOXI) injection 0.25 mg, 0.25 mg, Intravenous,  Once, 2 of 4 cycles Administration: 0.25 mg (11/03/2019), 0.25 mg (12/01/2019) pegfilgrastim (NEULASTA  ONPRO KIT) injection 6 mg, 6 mg, Subcutaneous, Once, 2 of 4 cycles Administration: 6 mg (11/05/2019), 6 mg (12/03/2019) CARBOplatin (PARAPLATIN) 390 mg in sodium chloride 0.9 % 250 mL chemo infusion, 390 mg (100 % of original dose 389 mg), Intravenous,  Once, 2 of 4 cycles Dose modification: 389 mg (original dose 389 mg, Cycle 1), 324 mg (original dose 324 mg, Cycle 3) Administration: 390 mg (11/03/2019), 320 mg (12/01/2019) etoposide (VEPESID) 200 mg in sodium chloride 0.9 % 500 mL chemo infusion, 100 mg/m2 = 200 mg, Intravenous,  Once, 2 of 4 cycles Dose modification: 80 mg/m2 (original dose 100 mg/m2, Cycle 3, Reason: Dose not tolerated) Administration: 200 mg (11/03/2019), 200 mg (11/04/2019), 200 mg (11/05/2019), 160 mg (12/01/2019), 160 mg (12/02/2019), 160 mg (12/03/2019) fosaprepitant (EMEND) 150 mg in sodium chloride 0.9 % 145 mL IVPB, 150 mg, Intravenous,  Once, 2 of 4 cycles Administration: 150 mg (11/03/2019), 150 mg (12/01/2019) durvalumab (IMFINZI) 1,500 mg in sodium chloride 0.9 % 100 mL chemo infusion, 1,500 mg, Intravenous,  Once, 2 of 8 cycles Administration: 1,500 mg (11/03/2019), 1,500 mg (12/01/2019)  for chemotherapy treatment.     CNS Oncologic History 10/21/19: Completed WBRT ([place])  Interval History:  [name] presents today for follow up after clinical changes.  She had described an increase in dizziness and imbalance, and was started on 62m daily decadron on 12/16/18.  She continues to complain of pain and numbness in her feet, right lateral leg.  She describes some improvement with the steroids although it is not clear her gait has change significantly. Continues to ambulate with walker as prior, no falls, no seizures.  Does describe "room spinning" intensely at times when she lays down; this stops after seconds.  No issues with the decadron.  H+P (11/17/19) Patient presents today for evaluation following completion of whole brain radiation with Dr. KSondra Comeearlier this  month.  She and her husband describe improvement in her cognition and balance from prior.  She is still relying on a rolling walker for ambulation around the home.  No issues with hand functionality or slurred speech.  No longer requiring steroids.  Take Lyrica and Cymbalta for neuropathic symptoms following initial chemotherapy. She is currently undergoing salvage chemotherapy with Dr. MJulien Nordmann has transfusion needed today because of cytopenias.  Medications: Current Outpatient Medications on File Prior to Visit  Medication Sig Dispense Refill  . acetaminophen (TYLENOL) 650 MG CR tablet Take 650 mg by mouth every 8 (eight) hours as needed for pain.    .Marland KitchenamLODipine (NORVASC) 10 MG tablet Take 1 tablet (10 mg total) by mouth daily as needed. (Patient taking differently: Take 10 mg by mouth daily as needed. Take only if SBP >140)    . atorvastatin (LIPITOR) 80 MG tablet TAKE 1 TABLET (80 MG TOTAL) BY MOUTH DAILY AT 6 PM. 90 tablet 3  . Clobetasol Prop Emollient Base (CLOBETASOL PROPIONATE E) 0.05 % emollient cream Apply 1 application topically 2 (two) times daily. (Patient not taking: Reported on 12/09/2019) 60 g 3  . dexamethasone (DECADRON) 4 MG tablet Take 1 tablet (4 mg total) by mouth daily. 7 tablet 0  . DULoxetine (CYMBALTA) 30 MG capsule TAKE 1 CAPSULE BY MOUTH EVERY DAY (Patient taking differently: Take 30 mg by mouth daily. ) 90 capsule 2  .  potassium chloride SA (KLOR-CON) 20 MEQ tablet Take 1 tablet (20 mEq total) by mouth daily. 7 tablet 0  . pregabalin (LYRICA) 50 MG capsule TAKE 1 CAPSULE 3 TIMES A DAY 90 capsule 2  . prochlorperazine (COMPAZINE) 10 MG tablet Take 1 tablet (10 mg total) by mouth every 6 (six) hours as needed for nausea or vomiting. 30 tablet 0  . traMADol (ULTRAM) 50 MG tablet Take 1-2 tablets (50-100 mg total) by mouth at bedtime as needed. 30 tablet 1  . XARELTO 20 MG TABS tablet TAKE 1 TABLET BY MOUTH EVERY DAY WITH SUPPER 90 tablet 0   No current  facility-administered medications on file prior to visit.    Allergies: No Known Allergies Past Medical History:  Past Medical History:  Diagnosis Date  . Arthritis   . Chronic pain   . Hyperlipidemia   . Hypertension   . Low blood magnesium 10/04/2018  . lung ca dx'd 06/2018  . Stroke [facility])    06/19/2018 minor   Past Surgical History:  Past Surgical History:  Procedure Laterality Date  . BIOPSY  06/19/2018   Procedure: BIOPSY;  Surgeon: Laurence Spates, MD;  Location: [facility];  Service: Endoscopy;;  . BIOPSY  10/11/2018   Procedure: BIOPSY;  Surgeon: Laurence Spates, MD;  Location: [facility];  Service: Endoscopy;;  . ENDARTERECTOMY Right 06/26/2018   Procedure: ENDARTERECTOMY CAROTID RIGHT;  Surgeon: Serafina Mitchell, MD;  Location[facility] OR;  Service: Vascular;  Laterality: Right;  . ESOPHAGOGASTRODUODENOSCOPY (EGD) WITH PROPOFOL N/A 06/19/2018   Procedure: ESOPHAGOGASTRODUODENOSCOPY (EGD) WITH PROPOFOL;  Surgeon: Laurence Spates, MD;  Location: [facility];  Service: Endoscopy;  Laterality: N/A;  . FLEXIBLE SIGMOIDOSCOPY N/A 10/11/2018   Procedure: FLEXIBLE SIGMOIDOSCOPY;  Surgeon: Laurence Spates, MD;  Location: [facility];  Service: Endoscopy;  Laterality: N/A;  . IR IMAGING GUIDED PORT INSERTION  10/07/2018  . PATCH ANGIOPLASTY Right 06/26/2018   Procedure: PATCH ANGIOPLASTY USING A XENOSURE 1cm x 6cm BIOLOGIC PATCH;  Surgeon: Serafina Mitchell, MD;  Location: [place];  Service: Vascular;  Laterality: Right;  Marland Kitchen VIDEO BRONCHOSCOPY WITH ENDOBRONCHIAL ULTRASOUND N/A 08/14/2018   Procedure: VIDEO BRONCHOSCOPY WITH ENDOBRONCHIAL ULTRASOUND;  Surgeon: Collene Gobble, MD;  Location: [facility] OR;  Service: Thoracic;  Laterality: N/A;   Social History:  Social History   Socioeconomic History  . Marital status: Single    Spouse name: Not on file  . Number of children: 0  . Years of education: Not on file  . Highest education level: Not on file  Occupational History  . Not on file    Tobacco Use  . Smoking status: Former Smoker    Packs/day: 1.00    Years: 10.00    Pack years: 10.00    Types: Cigarettes    Quit date: 06/19/2018    Years since quitting: 1.5  . Smokeless tobacco: Never Used  . Tobacco comment: Currently using Nicorette Gum  Substance and Sexual Activity  . Alcohol use: Yes    Comment: wine 2 glasses a day   . Drug use: Never    Comment: Hemp Oil  . Sexual activity: Not Currently  Other Topics Concern  . 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 Difficulty of Paying Living  Expenses:   Food Insecurity:   . Worried About Charity fundraiser in the Last Year:   . Arboriculturist in the Last Year:   Transportation Needs:   . Film/video editor (Medical):   Marland Kitchen Lack of Transportation (Non-Medical):   Physical Activity:   . Days of Exercise per Week:   . Minutes of Exercise per Session:   Stress:   . Feeling of Stress :   Social Connections:   . Frequency of Communication with Friends and Family:   . Frequency of Social Gatherings with Friends and Family:   . Attends Religious Services:   . Active Member of Clubs or Organizations:   . Attends Archivist Meetings:   Marland Kitchen Marital Status:   Intimate Partner Violence:   . Fear of Current or Ex-Partner:   . Emotionally Abused:   Marland Kitchen Physically Abused:   . Sexually Abused:    Family History:  Family History  Problem Relation Age of Onset  . Cancer Mother        lung cancer  . Diabetes Other   . Hyperlipidemia Other   . Hypertension Other   . Cancer Other     Review of Systems: Constitutional: Doesn't report fevers, chills or abnormal weight loss Eyes: Doesn't report blurriness of vision Ears, nose, mouth, throat, and face: Doesn't report sore throat Respiratory: Doesn't report cough, dyspnea or  wheezes Cardiovascular: Doesn't report palpitation, chest discomfort  Gastrointestinal:  Doesn't report nausea, constipation, diarrhea GU: Doesn't report incontinence Skin: Doesn't report skin rashes Neurological: Per HPI Musculoskeletal: Doesn't report joint pain Behavioral/Psych: Doesn't report anxiety  Physical Exam: Vitals:   12/22/19 0938  BP: (!) 159/101  Pulse: 91  Resp: 18  Temp: 97.7 F (36.5 C)  SpO2: 98%   KPS: 80. General: Alert, cooperative, pleasant, in no acute distress Head: Normal EENT: No conjunctival injection or scleral icterus.  Lungs: Resp effort normal Cardiac: Regular rate Abdomen: Non-distended abdomen Skin: No rashes cyanosis or petechiae. Extremities: No clubbing or edema  Neurologic Exam: Mental Status: Awake, alert, attentive to examiner. Oriented to self and environment. Language is fluent with intact comprehension.  Cranial Nerves: Visual acuity is grossly normal. Visual fields are full. Extra-ocular movements intact. No ptosis. Face is symmetric Motor: Tone and bulk are normal. Power is full in both arms and legs. Reflexes are symmetric, no pathologic reflexes present.  Sensory: Impaired stocking glove pattern Gait: Dystaxic   Labs: I have reviewed the data as listed    Component Value Date/Time   NA 141 12/16/2019 1150   NA 141 10/13/2019 1558   K 3.1 (L) 12/16/2019 1150   CL 107 12/16/2019 1150   CO2 21 (L) 12/16/2019 1150   GLUCOSE 96 12/16/2019 1150   BUN 15 12/16/2019 1150   BUN 26 10/13/2019 1558   CREATININE 1.28 (H) 12/16/2019 1150   CALCIUM 9.1 12/16/2019 1150   PROT 5.9 (L) 12/16/2019 1150   PROT 5.9 (L) 10/08/2019 1456   ALBUMIN 3.3 (L) 12/16/2019 1150   ALBUMIN 3.7 (L) 10/08/2019 1456   AST 14 (L) 12/16/2019 1150   ALT 16 12/16/2019 1150   ALKPHOS 87 12/16/2019 1150   BILITOT 0.3 12/16/2019 1150   GFRNONAA 43 (L) 12/16/2019 1150   GFRAA 50 (L) 12/16/2019 1150   Lab Results  Component Value Date   WBC 7.1  12/16/2019   NEUTROABS 5.4 12/16/2019   HGB 7.7 (L) 12/16/2019   HCT 23.5 (L) 12/16/2019   MCV 98.3  12/16/2019   PLT 27 (L) 12/16/2019    Imaging:  CT Abdomen Pelvis Wo Contrast  Result Date: 12/19/2019 CLINICAL DATA:  Follow-up metastatic small cell lung carcinoma. Ongoing chemotherapy. Evaluate treatment response. EXAM: CT CHEST, ABDOMEN AND PELVIS WITHOUT CONTRAST TECHNIQUE: Multidetector CT imaging of the chest, abdomen and pelvis was performed following the standard protocol without IV contrast. COMPARISON:  Chest CT on 10/24/2019, and AP CT on 10/29/2018 FINDINGS: CT CHEST FINDINGS Cardiovascular: No acute findings. Aortic and coronary artery atherosclerosis noted. Stable mild pericardial thickening. Mediastinum/Lymph Nodes: Enlarged right paratracheal lymph node shows no change measuring 3.2 cm on image 18/2. 1.3 cm subcarinal lymph node also shows no significant change. Mild right hilar lymphadenopathy also appears stable enhanced exam. No new or increased sites of identified. Lungs/Pleura: Stable post radiation changes seen in the medial right lung. A persistent nodular density is seen in the central right lower lobe surrounded by parenchymal scarring and associated traction bronchiectasis. This measures approximately 2.4 x 2.3 cm on image 75/4, without significant change in size compared to previous study. No new or enlarging pulmonary nodules or masses are identified. Stable mild right pleural thickening, without change. Musculoskeletal:  No suspicious bone lesions identified. CT ABDOMEN AND PELVIS FINDINGS Hepatobiliary: No masses visualized on this unenhanced exam. Gallbladder is unremarkable. No evidence of biliary ductal dilatation. Pancreas: No mass or inflammatory changes identified on this unenhanced exam. Spleen:  Within normal limits in size. Adrenals/Urinary Tract: Stable 1 cm left adrenal nodule, consistent with benign adenoma. No evidence of urolithiasis or hydronephrosis.  Unremarkable appearance of bladder. Stomach/Bowel: No evidence of obstruction, inflammatory process, or abnormal fluid collections. Vascular/Lymphatic: No pathologically enlarged lymph nodes identified. No abdominal aortic aneurysm. Aortic atherosclerosis noted. Reproductive:  No masses or other significant abnormality. Other:  None. Musculoskeletal: Ill-defined areas of sclerosis are again seen involving the right posterior elements of T5 and T6 vertebra, without change. No other suspicious bone lesions identified. IMPRESSION: 1. Stable central right lower lobe pulmonary nodule, and surrounding post radiation changes. 2. Stable mediastinal and right hilar lymphadenopathy. 3. Stable ill-defined sclerosis involving the right posterior elements of T5 and T6. Differential diagnosis includes sclerotic bone metastases and post radiation changes. 4. No new or progressive metastatic disease within the chest, abdomen, or pelvis. Aortic Atherosclerosis (ICD10-I70.0). Electronically Signed   By: Marlaine Hind M.D.   On: 12/19/2019 12:38   CT Chest Wo Contrast  Result Date: 12/19/2019 CLINICAL DATA:  Follow-up metastatic small cell lung carcinoma. Ongoing chemotherapy. Evaluate treatment response. EXAM: CT CHEST, ABDOMEN AND PELVIS WITHOUT CONTRAST TECHNIQUE: Multidetector CT imaging of the chest, abdomen and pelvis was performed following the standard protocol without IV contrast. COMPARISON:  Chest CT on 10/24/2019, and AP CT on 10/29/2018 FINDINGS: CT CHEST FINDINGS Cardiovascular: No acute findings. Aortic and coronary artery atherosclerosis noted. Stable mild pericardial thickening. Mediastinum/Lymph Nodes: Enlarged right paratracheal lymph node shows no change measuring 3.2 cm on image 18/2. 1.3 cm subcarinal lymph node also shows no significant change. Mild right hilar lymphadenopathy also appears stable enhanced exam. No new or increased sites of identified. Lungs/Pleura: Stable post radiation changes seen in the  medial right lung. A persistent nodular density is seen in the central right lower lobe surrounded by parenchymal scarring and associated traction bronchiectasis. This measures approximately 2.4 x 2.3 cm on image 75/4, without significant change in size compared to previous study. No new or enlarging pulmonary nodules or masses are identified. Stable mild right pleural thickening, without change. Musculoskeletal:  No suspicious  bone lesions identified. CT ABDOMEN AND PELVIS FINDINGS Hepatobiliary: No masses visualized on this unenhanced exam. Gallbladder is unremarkable. No evidence of biliary ductal dilatation. Pancreas: No mass or inflammatory changes identified on this unenhanced exam. Spleen:  Within normal limits in size. Adrenals/Urinary Tract: Stable 1 cm left adrenal nodule, consistent with benign adenoma. No evidence of urolithiasis or hydronephrosis. Unremarkable appearance of bladder. Stomach/Bowel: No evidence of obstruction, inflammatory process, or abnormal fluid collections. Vascular/Lymphatic: No pathologically enlarged lymph nodes identified. No abdominal aortic aneurysm. Aortic atherosclerosis noted. Reproductive:  No masses or other significant abnormality. Other:  None. Musculoskeletal: Ill-defined areas of sclerosis are again seen involving the right posterior elements of T5 and T6 vertebra, without change. No other suspicious bone lesions identified. IMPRESSION: 1. Stable central right lower lobe pulmonary nodule, and surrounding post radiation changes. 2. Stable mediastinal and right hilar lymphadenopathy. 3. Stable ill-defined sclerosis involving the right posterior elements of T5 and T6. Differential diagnosis includes sclerotic bone metastases and post radiation changes. 4. No new or progressive metastatic disease within the chest, abdomen, or pelvis. Aortic Atherosclerosis (ICD10-I70.0). Electronically Signed   By: Marlaine Hind M.D.   On: 12/19/2019 12:38      Assessment/Plan Metastatic small cell carcinoma to brain [facility]) [C79.31]  [name] presents with syndrome c/w posterior fossa inflammation 2/2 recent radiation, and is modestly clinically improved today after dosing dexamethasone 28m daily x6 days.  Vertiginous episodes are positional and habituated and may not be related to CNS tumor/radiation burden.  We recommended she decrease decadron to 212mdaily x1 week, then decrease to 56m756maily thereafter.  We ask that DebMERIAL MORITZturn to clinic in 1 months following next brain MRI, or sooner as needed.  We appreciate the opportunity to participate in the care of DebWEDA BAUMGARNER  All questions were answered. The patient knows to call the clinic with any problems, questions or concerns. No barriers to learning were detected.  The total time spent in the encounter was 30 minutes and more than 50% was on counseling and review of test results   ZacVentura SellersD Medical Director of Neuro-Oncology [facility] [address]/07/21 9:29 AM

## 2019-12-22 NOTE — Progress Notes (Signed)
[facility] [phone]   [phone]  OFFICE PROGRESS NOTE  Rutherford Guys, MD [address] [name] [address]  DIAGNOSIS: Metastatic small cell lung cancer initially diagnosed as Limited stage (T2b, N2, M0) small cell lung cancer, pending further staging work-up diagnosed and January 2020.  She presented with right lower lobe lung mass in addition to right hilar and mediastinal lymphadenopathy.  She has evidence of metastatic disease to the brain in March 2021.  PRIOR THERAPY:  1) Systemic chemotherapy concurrent with radiation.  Chemotherapy withcisplatin 80 mg/M2 on day 1 and etoposide 100 mg/M2 on days 1, 2 and 3 every 3 weeks.First dose September 03, 2018. Status post5cycles.Dose reducedstarting fromcycle #4to cisplatin 40 mg/m2 and etoposide 80 mg/m2 due to renal insufficiency. 2) whole brain irradiation under the care of Dr. Sondra Come.  CURRENT THERAPY: Systemic chemotherapy with carboplatin for AUC of 5 on day 1, etoposide 100 mg/M2 on days 1, 2 and 3 with Neulasta support in addition to Imfinzi 1500 mg IV every 3 weeks during chemotherapy course followed by maintenance Imfinzi every 4 weeks.  Status post 2 cycles.   INTERVAL HISTORY: [name] 69 y.o. female returns to the clinic today for follow-up visit accompanied by her husband.  The patient is feeling fine today with no concerning complaints except for fatigue.  She received 2 units of PRBCs transfusion few days ago and she felt much better.  She denied having any current chest pain, shortness of breath, cough or hemoptysis.  She denied having any weight loss or night sweats.  She has no nausea, vomiting, diarrhea or constipation.  She denied having any headache or visual changes.  The patient has repeat CT scan of the chest, abdomen pelvis performed recently and she is here for evaluation and discussion of her scan results before starting cycle #3.  MEDICAL HISTORY: Past  Medical History:  Diagnosis Date   Arthritis    Chronic pain    Hyperlipidemia    Hypertension    Low blood magnesium 10/04/2018   lung ca dx'd 06/2018   Stroke ([facility])    06/19/2018 minor    ALLERGIES:  has No Known Allergies.  MEDICATIONS:  Current Outpatient Medications  Medication Sig Dispense Refill   acetaminophen (TYLENOL) 650 MG CR tablet Take 650 mg by mouth every 8 (eight) hours as needed for pain.     amLODipine (NORVASC) 10 MG tablet Take 1 tablet (10 mg total) by mouth daily as needed. (Patient not taking: Reported on 12/22/2019)     atorvastatin (LIPITOR) 80 MG tablet TAKE 1 TABLET (80 MG TOTAL) BY MOUTH DAILY AT 6 PM. 90 tablet 3   Clobetasol Prop Emollient Base (CLOBETASOL PROPIONATE E) 0.05 % emollient cream Apply 1 application topically 2 (two) times daily. 60 g 3   dexamethasone (DECADRON) 1 MG tablet Take 2mg  in AM for #7 days, then decrease to 1mg  in AM 60 tablet 0   DULoxetine (CYMBALTA) 30 MG capsule TAKE 1 CAPSULE BY MOUTH EVERY DAY (Patient taking differently: Take 30 mg by mouth daily. ) 90 capsule 2   potassium chloride SA (KLOR-CON) 20 MEQ tablet Take 1 tablet (20 mEq total) by mouth daily. 7 tablet 0   pregabalin (LYRICA) 50 MG capsule TAKE 1 CAPSULE 3 TIMES A DAY 90 capsule 2   prochlorperazine (COMPAZINE) 10 MG tablet Take 1 tablet (10 mg total) by mouth every 6 (six) hours as needed for nausea or vomiting. 30 tablet  0   traMADol (ULTRAM) 50 MG tablet Take 1-2 tablets (50-100 mg total) by mouth at bedtime as needed. 30 tablet 1   XARELTO 20 MG TABS tablet TAKE 1 TABLET BY MOUTH EVERY DAY WITH SUPPER 90 tablet 0   No current facility-administered medications for this visit.    SURGICAL HISTORY:  Past Surgical History:  Procedure Laterality Date   BIOPSY  06/19/2018   Procedure: BIOPSY;  Surgeon: Laurence Spates, MD;  Location: [facility];  Service: Endoscopy;;   BIOPSY  10/11/2018   Procedure: BIOPSY;  Surgeon: Laurence Spates, MD;   Location: [facility];  Service: Endoscopy;;   ENDARTERECTOMY Right 06/26/2018   Procedure: ENDARTERECTOMY CAROTID RIGHT;  Surgeon: Serafina Mitchell, MD;  Location: [facility] OR;  Service: Vascular;  Laterality: Right;   ESOPHAGOGASTRODUODENOSCOPY (EGD) WITH PROPOFOL N/A 06/19/2018   Procedure: ESOPHAGOGASTRODUODENOSCOPY (EGD) WITH PROPOFOL;  Surgeon: Laurence Spates, MD;  Location: [facility];  Service: Endoscopy;  Laterality: N/A;   FLEXIBLE SIGMOIDOSCOPY N/A 10/11/2018   Procedure: FLEXIBLE SIGMOIDOSCOPY;  Surgeon: Laurence Spates, MD;  Location: [facility];  Service: Endoscopy;  Laterality: N/A;   IR IMAGING GUIDED PORT INSERTION  10/07/2018   PATCH ANGIOPLASTY Right 06/26/2018   Procedure: PATCH ANGIOPLASTY USING A XENOSURE 1cm x 6cm BIOLOGIC PATCH;  Surgeon: Serafina Mitchell, MD;  Location: [facility] OR;  Service: Vascular;  Laterality: Right;   VIDEO BRONCHOSCOPY WITH ENDOBRONCHIAL ULTRASOUND N/A 08/14/2018   Procedure: VIDEO BRONCHOSCOPY WITH ENDOBRONCHIAL ULTRASOUND;  Surgeon: Collene Gobble, MD;  Location: [facility] OR;  Service: Thoracic;  Laterality: N/A;    REVIEW OF SYSTEMS:  Constitutional: positive for fatigue Eyes: negative Ears, nose, mouth, throat, and face: negative Respiratory: negative Cardiovascular: negative Gastrointestinal: negative Genitourinary:negative Integument/breast: negative Hematologic/lymphatic: negative Musculoskeletal:negative Neurological: negative Behavioral/Psych: negative Endocrine: negative Allergic/Immunologic: negative   PHYSICAL EXAMINATION: General appearance: alert, cooperative, fatigued and no distress Head: Normocephalic, without obvious abnormality, atraumatic Neck: no adenopathy, no JVD, supple, symmetrical, trachea midline and thyroid not enlarged, symmetric, no tenderness/mass/nodules Lymph nodes: Cervical, supraclavicular, and axillary nodes normal. Resp: clear to auscultation bilaterally Back: symmetric, no curvature. ROM normal. No CVA  tenderness. Cardio: regular rate and rhythm, S1, S2 normal, no murmur, click, rub or gallop GI: soft, non-tender; bowel sounds normal; no masses,  no organomegaly Extremities: extremities normal, atraumatic, no cyanosis or edema Neurologic: Alert and oriented X 3, normal strength and tone. Normal symmetric reflexes. Normal coordination and gait  ECOG PERFORMANCE STATUS: 1 - Symptomatic but completely ambulatory  Blood pressure (!) 153/94, pulse 85, temperature 97.9 F (36.6 C), temperature source Temporal, resp. rate 18, height 5\' 6"  (1.676 m), weight 179 lb 3.2 oz (81.3 kg), SpO2 96 %.  LABORATORY DATA: Lab Results  Component Value Date   WBC 8.2 12/22/2019   HGB 9.9 (L) 12/22/2019   HCT 30.3 (L) 12/22/2019   MCV 98.4 12/22/2019   PLT 117 (L) 12/22/2019      Chemistry      Component Value Date/Time   NA 141 12/16/2019 1150   NA 141 10/13/2019 1558   K 3.1 (L) 12/16/2019 1150   CL 107 12/16/2019 1150   CO2 21 (L) 12/16/2019 1150   BUN 15 12/16/2019 1150   BUN 26 10/13/2019 1558   CREATININE 1.28 (H) 12/16/2019 1150      Component Value Date/Time   CALCIUM 9.1 12/16/2019 1150   ALKPHOS 87 12/16/2019 1150   AST 14 (L) 12/16/2019 1150   ALT 16 12/16/2019 1150   BILITOT 0.3 12/16/2019 1150  RADIOGRAPHIC STUDIES: CT Abdomen Pelvis Wo Contrast  Result Date: 12/19/2019 CLINICAL DATA:  Follow-up metastatic small cell lung carcinoma. Ongoing chemotherapy. Evaluate treatment response. EXAM: CT CHEST, ABDOMEN AND PELVIS WITHOUT CONTRAST TECHNIQUE: Multidetector CT imaging of the chest, abdomen and pelvis was performed following the standard protocol without IV contrast. COMPARISON:  Chest CT on 10/24/2019, and AP CT on 10/29/2018 FINDINGS: CT CHEST FINDINGS Cardiovascular: No acute findings. Aortic and coronary artery atherosclerosis noted. Stable mild pericardial thickening. Mediastinum/Lymph Nodes: Enlarged right paratracheal lymph node shows no change measuring 3.2 cm on  image 18/2. 1.3 cm subcarinal lymph node also shows no significant change. Mild right hilar lymphadenopathy also appears stable enhanced exam. No new or increased sites of identified. Lungs/Pleura: Stable post radiation changes seen in the medial right lung. A persistent nodular density is seen in the central right lower lobe surrounded by parenchymal scarring and associated traction bronchiectasis. This measures approximately 2.4 x 2.3 cm on image 75/4, without significant change in size compared to previous study. No new or enlarging pulmonary nodules or masses are identified. Stable mild right pleural thickening, without change. Musculoskeletal:  No suspicious bone lesions identified. CT ABDOMEN AND PELVIS FINDINGS Hepatobiliary: No masses visualized on this unenhanced exam. Gallbladder is unremarkable. No evidence of biliary ductal dilatation. Pancreas: No mass or inflammatory changes identified on this unenhanced exam. Spleen:  Within normal limits in size. Adrenals/Urinary Tract: Stable 1 cm left adrenal nodule, consistent with benign adenoma. No evidence of urolithiasis or hydronephrosis. Unremarkable appearance of bladder. Stomach/Bowel: No evidence of obstruction, inflammatory process, or abnormal fluid collections. Vascular/Lymphatic: No pathologically enlarged lymph nodes identified. No abdominal aortic aneurysm. Aortic atherosclerosis noted. Reproductive:  No masses or other significant abnormality. Other:  None. Musculoskeletal: Ill-defined areas of sclerosis are again seen involving the right posterior elements of T5 and T6 vertebra, without change. No other suspicious bone lesions identified. IMPRESSION: 1. Stable central right lower lobe pulmonary nodule, and surrounding post radiation changes. 2. Stable mediastinal and right hilar lymphadenopathy. 3. Stable ill-defined sclerosis involving the right posterior elements of T5 and T6. Differential diagnosis includes sclerotic bone metastases and post  radiation changes. 4. No new or progressive metastatic disease within the chest, abdomen, or pelvis. Aortic Atherosclerosis (ICD10-I70.0). Electronically Signed   By: Marlaine Hind M.D.   On: 12/19/2019 12:38   CT Chest Wo Contrast  Result Date: 12/19/2019 CLINICAL DATA:  Follow-up metastatic small cell lung carcinoma. Ongoing chemotherapy. Evaluate treatment response. EXAM: CT CHEST, ABDOMEN AND PELVIS WITHOUT CONTRAST TECHNIQUE: Multidetector CT imaging of the chest, abdomen and pelvis was performed following the standard protocol without IV contrast. COMPARISON:  Chest CT on 10/24/2019, and AP CT on 10/29/2018 FINDINGS: CT CHEST FINDINGS Cardiovascular: No acute findings. Aortic and coronary artery atherosclerosis noted. Stable mild pericardial thickening. Mediastinum/Lymph Nodes: Enlarged right paratracheal lymph node shows no change measuring 3.2 cm on image 18/2. 1.3 cm subcarinal lymph node also shows no significant change. Mild right hilar lymphadenopathy also appears stable enhanced exam. No new or increased sites of identified. Lungs/Pleura: Stable post radiation changes seen in the medial right lung. A persistent nodular density is seen in the central right lower lobe surrounded by parenchymal scarring and associated traction bronchiectasis. This measures approximately 2.4 x 2.3 cm on image 75/4, without significant change in size compared to previous study. No new or enlarging pulmonary nodules or masses are identified. Stable mild right pleural thickening, without change. Musculoskeletal:  No suspicious bone lesions identified. CT ABDOMEN AND PELVIS FINDINGS Hepatobiliary: No  masses visualized on this unenhanced exam. Gallbladder is unremarkable. No evidence of biliary ductal dilatation. Pancreas: No mass or inflammatory changes identified on this unenhanced exam. Spleen:  Within normal limits in size. Adrenals/Urinary Tract: Stable 1 cm left adrenal nodule, consistent with benign adenoma. No evidence  of urolithiasis or hydronephrosis. Unremarkable appearance of bladder. Stomach/Bowel: No evidence of obstruction, inflammatory process, or abnormal fluid collections. Vascular/Lymphatic: No pathologically enlarged lymph nodes identified. No abdominal aortic aneurysm. Aortic atherosclerosis noted. Reproductive:  No masses or other significant abnormality. Other:  None. Musculoskeletal: Ill-defined areas of sclerosis are again seen involving the right posterior elements of T5 and T6 vertebra, without change. No other suspicious bone lesions identified. IMPRESSION: 1. Stable central right lower lobe pulmonary nodule, and surrounding post radiation changes. 2. Stable mediastinal and right hilar lymphadenopathy. 3. Stable ill-defined sclerosis involving the right posterior elements of T5 and T6. Differential diagnosis includes sclerotic bone metastases and post radiation changes. 4. No new or progressive metastatic disease within the chest, abdomen, or pelvis. Aortic Atherosclerosis (ICD10-I70.0). Electronically Signed   By: Marlaine Hind M.D.   On: 12/19/2019 12:38    ASSESSMENT AND PLAN: This is a very pleasant 69 years old white female with relapsed small cell lung cancer initially diagnosed as limited stage small cell lung cancer diagnosed in January 2020 status post systemic chemotherapy initially with cisplatin and etoposide status post 5 cycles.  Her dose of cisplatin and etoposide was significantly reduced starting from cycle #4 secondary to worsening renal insufficiency.  Her treatment was concurrent with radiotherapy. The patient had evidence for metastatic disease to the brain and bone in March 2021. She underwent whole brain irradiation under the care of Dr. Sondra Come. Her scan showed no clear evidence for progression in the lung but there was new sclerotic bone metastasis. The patient started systemic chemotherapy with carboplatin for AUC of 5 on day 1, etoposide 100 mg/M2 on days 1, 2, 3 with Neulasta  support in addition to Imfinzi 1500 mg IV every 3 weeks, status post 2 cycles.   The patient is tolerating her treatment much better after we reduce the dose of carboplatin to AUC of 4 and etoposide to 80 mg/M2.  She also received 2 units of PRBCs transfusion recently. She had repeat CT scan of the chest, abdomen pelvis performed recently.  I personally and independently reviewed the scans and discussed the results with the patient and her husband today. Her scan showed no concerning findings for disease progression. I recommended for her to continue her current treatment with carboplatin and etoposide in addition to [place] and she will proceed with cycle #3 today. For the hypertension she was advised to take her blood pressure medication as prescribed and to monitor it closely at home. The patient will come back for follow-up visit in 3 weeks for evaluation before cycle #4. She was advised to call immediately if she has any concerning symptoms in the interval. The patient voices understanding of current disease status and treatment options and is in agreement with the current care plan.  All questions were answered. The patient knows to call the clinic with any problems, questions or concerns. We can certainly see the patient much sooner if necessary.   Disclaimer: This note was dictated with voice recognition software. Similar sounding words can inadvertently be transcribed and may not be corrected upon review.

## 2019-12-23 ENCOUNTER — Inpatient Hospital Stay: Payer: Medicare PPO

## 2019-12-23 ENCOUNTER — Telehealth: Payer: Self-pay | Admitting: Medical Oncology

## 2019-12-23 ENCOUNTER — Other Ambulatory Visit: Payer: Self-pay

## 2019-12-23 VITALS — BP 119/75 | HR 81 | Temp 98.3°F | Resp 17

## 2019-12-23 DIAGNOSIS — C3431 Malignant neoplasm of lower lobe, right bronchus or lung: Secondary | ICD-10-CM | POA: Diagnosis not present

## 2019-12-23 DIAGNOSIS — Z7689 Persons encountering health services in other specified circumstances: Secondary | ICD-10-CM | POA: Diagnosis not present

## 2019-12-23 DIAGNOSIS — C7931 Secondary malignant neoplasm of brain: Secondary | ICD-10-CM

## 2019-12-23 DIAGNOSIS — R5383 Other fatigue: Secondary | ICD-10-CM | POA: Diagnosis not present

## 2019-12-23 DIAGNOSIS — D6481 Anemia due to antineoplastic chemotherapy: Secondary | ICD-10-CM | POA: Diagnosis not present

## 2019-12-23 DIAGNOSIS — Z5112 Encounter for antineoplastic immunotherapy: Secondary | ICD-10-CM | POA: Diagnosis not present

## 2019-12-23 DIAGNOSIS — C7951 Secondary malignant neoplasm of bone: Secondary | ICD-10-CM | POA: Diagnosis not present

## 2019-12-23 DIAGNOSIS — T451X5A Adverse effect of antineoplastic and immunosuppressive drugs, initial encounter: Secondary | ICD-10-CM | POA: Diagnosis not present

## 2019-12-23 DIAGNOSIS — Z5111 Encounter for antineoplastic chemotherapy: Secondary | ICD-10-CM | POA: Diagnosis not present

## 2019-12-23 MED ORDER — SODIUM CHLORIDE 0.9 % IV SOLN
10.0000 mg | Freq: Once | INTRAVENOUS | Status: AC
  Administered 2019-12-23: 10 mg via INTRAVENOUS
  Filled 2019-12-23: qty 10

## 2019-12-23 MED ORDER — HEPARIN SOD (PORK) LOCK FLUSH 100 UNIT/ML IV SOLN
500.0000 [IU] | Freq: Once | INTRAVENOUS | Status: AC | PRN
  Administered 2019-12-23: 500 [IU]
  Filled 2019-12-23: qty 5

## 2019-12-23 MED ORDER — SODIUM CHLORIDE 0.9 % IV SOLN
Freq: Once | INTRAVENOUS | Status: AC
  Filled 2019-12-23: qty 250

## 2019-12-23 MED ORDER — SODIUM CHLORIDE 0.9% FLUSH
10.0000 mL | INTRAVENOUS | Status: DC | PRN
  Administered 2019-12-23: 10 mL
  Filled 2019-12-23: qty 10

## 2019-12-23 MED ORDER — SODIUM CHLORIDE 0.9 % IV SOLN
80.0000 mg/m2 | Freq: Once | INTRAVENOUS | Status: AC
  Administered 2019-12-23: 160 mg via INTRAVENOUS
  Filled 2019-12-23: qty 8

## 2019-12-23 NOTE — Telephone Encounter (Signed)
-----   Message from Curt Bears, MD sent at 12/22/2019  3:53 PM EDT ----- Please encourage her to increase her potassium rich diet. ----- Message ----- From: Buel Ream, [facility] [place] Sent: 12/22/2019  10:53 AM EDT To: Curt Bears, MD

## 2019-12-23 NOTE — Telephone Encounter (Signed)
Pt.notified

## 2019-12-23 NOTE — Patient Instructions (Signed)
[facility] Discharge Instructions for Patients Receiving Chemotherapy  Today you received the following chemotherapy agents: etoposide  To help prevent nausea and vomiting after your treatment, we encourage you to take your nausea medication as directed.   If you develop nausea and vomiting that is not controlled by your nausea medication, call the clinic.   BELOW ARE SYMPTOMS THAT SHOULD BE REPORTED IMMEDIATELY:  *FEVER GREATER THAN 100.5 F  *CHILLS WITH OR WITHOUT FEVER  NAUSEA AND VOMITING THAT IS NOT CONTROLLED WITH YOUR NAUSEA MEDICATION  *UNUSUAL SHORTNESS OF BREATH  *UNUSUAL BRUISING OR BLEEDING  TENDERNESS IN MOUTH AND THROAT WITH OR WITHOUT PRESENCE OF ULCERS  *URINARY PROBLEMS  *BOWEL PROBLEMS  UNUSUAL RASH Items with * indicate a potential emergency and should be followed up as soon as possible.  Feel free to call the clinic should you have any questions or concerns. The clinic phone number is [phone].  Please show the CHEMO ALERT CARD at check-in to the Emergency Department and triage nurse.   

## 2019-12-24 ENCOUNTER — Telehealth: Payer: Self-pay | Admitting: Internal Medicine

## 2019-12-24 ENCOUNTER — Inpatient Hospital Stay: Payer: Medicare PPO

## 2019-12-24 ENCOUNTER — Other Ambulatory Visit: Payer: Self-pay

## 2019-12-24 VITALS — BP 165/89 | HR 76 | Temp 98.0°F | Resp 17

## 2019-12-24 DIAGNOSIS — D6481 Anemia due to antineoplastic chemotherapy: Secondary | ICD-10-CM | POA: Diagnosis not present

## 2019-12-24 DIAGNOSIS — C3431 Malignant neoplasm of lower lobe, right bronchus or lung: Secondary | ICD-10-CM | POA: Diagnosis not present

## 2019-12-24 DIAGNOSIS — C7951 Secondary malignant neoplasm of bone: Secondary | ICD-10-CM | POA: Diagnosis not present

## 2019-12-24 DIAGNOSIS — T451X5A Adverse effect of antineoplastic and immunosuppressive drugs, initial encounter: Secondary | ICD-10-CM | POA: Diagnosis not present

## 2019-12-24 DIAGNOSIS — Z7689 Persons encountering health services in other specified circumstances: Secondary | ICD-10-CM | POA: Diagnosis not present

## 2019-12-24 DIAGNOSIS — C7931 Secondary malignant neoplasm of brain: Secondary | ICD-10-CM

## 2019-12-24 DIAGNOSIS — Z5112 Encounter for antineoplastic immunotherapy: Secondary | ICD-10-CM | POA: Diagnosis not present

## 2019-12-24 DIAGNOSIS — Z5111 Encounter for antineoplastic chemotherapy: Secondary | ICD-10-CM | POA: Diagnosis not present

## 2019-12-24 DIAGNOSIS — R5383 Other fatigue: Secondary | ICD-10-CM | POA: Diagnosis not present

## 2019-12-24 MED ORDER — SODIUM CHLORIDE 0.9 % IV SOLN
80.0000 mg/m2 | Freq: Once | INTRAVENOUS | Status: AC
  Administered 2019-12-24: 160 mg via INTRAVENOUS
  Filled 2019-12-24: qty 8

## 2019-12-24 MED ORDER — PEGFILGRASTIM 6 MG/0.6ML ~~LOC~~ PSKT
6.0000 mg | PREFILLED_SYRINGE | Freq: Once | SUBCUTANEOUS | Status: AC
  Administered 2019-12-24: 6 mg via SUBCUTANEOUS

## 2019-12-24 MED ORDER — SODIUM CHLORIDE 0.9 % IV SOLN
10.0000 mg | Freq: Once | INTRAVENOUS | Status: AC
  Administered 2019-12-24: 10 mg via INTRAVENOUS
  Filled 2019-12-24: qty 10

## 2019-12-24 MED ORDER — SODIUM CHLORIDE 0.9% FLUSH
10.0000 mL | INTRAVENOUS | Status: DC | PRN
  Administered 2019-12-24: 10 mL
  Filled 2019-12-24: qty 10

## 2019-12-24 MED ORDER — PEGFILGRASTIM 6 MG/0.6ML ~~LOC~~ PSKT
PREFILLED_SYRINGE | SUBCUTANEOUS | Status: AC
  Filled 2019-12-24: qty 0.6

## 2019-12-24 MED ORDER — SODIUM CHLORIDE 0.9 % IV SOLN
Freq: Once | INTRAVENOUS | Status: AC
  Filled 2019-12-24: qty 250

## 2019-12-24 MED ORDER — HEPARIN SOD (PORK) LOCK FLUSH 100 UNIT/ML IV SOLN
500.0000 [IU] | Freq: Once | INTRAVENOUS | Status: AC | PRN
  Administered 2019-12-24: 500 [IU]
  Filled 2019-12-24: qty 5

## 2019-12-24 NOTE — Patient Instructions (Addendum)
REMOVE NEULASTA ONPRO AT 7:30PM ON 12/25/19.  [facility] Discharge Instructions for Patients Receiving Chemotherapy  Today you received the following chemotherapy agents: etoposide  To help prevent nausea and vomiting after your treatment, we encourage you to take your nausea medication as directed.    If you develop nausea and vomiting that is not controlled by your nausea medication, call the clinic.   BELOW ARE SYMPTOMS THAT SHOULD BE REPORTED IMMEDIATELY:  *FEVER GREATER THAN 100.5 F  *CHILLS WITH OR WITHOUT FEVER  NAUSEA AND VOMITING THAT IS NOT CONTROLLED WITH YOUR NAUSEA MEDICATION  *UNUSUAL SHORTNESS OF BREATH  *UNUSUAL BRUISING OR BLEEDING  TENDERNESS IN MOUTH AND THROAT WITH OR WITHOUT PRESENCE OF ULCERS  *URINARY PROBLEMS  *BOWEL PROBLEMS  UNUSUAL RASH Items with * indicate a potential emergency and should be followed up as soon as possible.  Feel free to call the clinic should you have any questions or concerns. The clinic phone number is [phone].  Please show the [facility] at check-in to the Emergency Department and triage nurse.

## 2019-12-24 NOTE — Telephone Encounter (Signed)
Next treatment already scheduled per 6/7 los.

## 2019-12-29 ENCOUNTER — Other Ambulatory Visit: Payer: Self-pay

## 2019-12-29 ENCOUNTER — Inpatient Hospital Stay: Payer: Medicare PPO

## 2019-12-29 DIAGNOSIS — C3431 Malignant neoplasm of lower lobe, right bronchus or lung: Secondary | ICD-10-CM

## 2019-12-29 DIAGNOSIS — Z7689 Persons encountering health services in other specified circumstances: Secondary | ICD-10-CM | POA: Diagnosis not present

## 2019-12-29 DIAGNOSIS — R5383 Other fatigue: Secondary | ICD-10-CM | POA: Diagnosis not present

## 2019-12-29 DIAGNOSIS — Z5111 Encounter for antineoplastic chemotherapy: Secondary | ICD-10-CM | POA: Diagnosis not present

## 2019-12-29 DIAGNOSIS — C7931 Secondary malignant neoplasm of brain: Secondary | ICD-10-CM | POA: Diagnosis not present

## 2019-12-29 DIAGNOSIS — Z95828 Presence of other vascular implants and grafts: Secondary | ICD-10-CM

## 2019-12-29 DIAGNOSIS — T451X5A Adverse effect of antineoplastic and immunosuppressive drugs, initial encounter: Secondary | ICD-10-CM | POA: Diagnosis not present

## 2019-12-29 DIAGNOSIS — D6481 Anemia due to antineoplastic chemotherapy: Secondary | ICD-10-CM | POA: Diagnosis not present

## 2019-12-29 DIAGNOSIS — C7951 Secondary malignant neoplasm of bone: Secondary | ICD-10-CM | POA: Diagnosis not present

## 2019-12-29 DIAGNOSIS — Z5112 Encounter for antineoplastic immunotherapy: Secondary | ICD-10-CM | POA: Diagnosis not present

## 2019-12-29 LAB — CBC WITH DIFFERENTIAL (CANCER CENTER ONLY)
Abs Immature Granulocytes: 0.33 10*3/uL — ABNORMAL HIGH (ref 0.00–0.07)
Basophils Absolute: 0 10*3/uL (ref 0.0–0.1)
Basophils Relative: 0 %
Eosinophils Absolute: 0 10*3/uL (ref 0.0–0.5)
Eosinophils Relative: 0 %
HCT: 28.2 % — ABNORMAL LOW (ref 36.0–46.0)
Hemoglobin: 9.2 g/dL — ABNORMAL LOW (ref 12.0–15.0)
Immature Granulocytes: 5 %
Lymphocytes Relative: 10 %
Lymphs Abs: 0.7 10*3/uL (ref 0.7–4.0)
MCH: 33.1 pg (ref 26.0–34.0)
MCHC: 32.6 g/dL (ref 30.0–36.0)
MCV: 101.4 fL — ABNORMAL HIGH (ref 80.0–100.0)
Monocytes Absolute: 0.2 10*3/uL (ref 0.1–1.0)
Monocytes Relative: 3 %
Neutro Abs: 5.7 10*3/uL (ref 1.7–7.7)
Neutrophils Relative %: 82 %
Platelet Count: 46 10*3/uL — ABNORMAL LOW (ref 150–400)
RBC: 2.78 MIL/uL — ABNORMAL LOW (ref 3.87–5.11)
RDW: 15.9 % — ABNORMAL HIGH (ref 11.5–15.5)
WBC Count: 6.9 10*3/uL (ref 4.0–10.5)
nRBC: 0 % (ref 0.0–0.2)

## 2019-12-29 LAB — CMP (CANCER CENTER ONLY)
ALT: 15 U/L (ref 0–44)
AST: 10 U/L — ABNORMAL LOW (ref 15–41)
Albumin: 3.1 g/dL — ABNORMAL LOW (ref 3.5–5.0)
Alkaline Phosphatase: 70 U/L (ref 38–126)
Anion gap: 11 (ref 5–15)
BUN: 20 mg/dL (ref 8–23)
CO2: 24 mmol/L (ref 22–32)
Calcium: 8.5 mg/dL — ABNORMAL LOW (ref 8.9–10.3)
Chloride: 108 mmol/L (ref 98–111)
Creatinine: 1.09 mg/dL — ABNORMAL HIGH (ref 0.44–1.00)
GFR, Est AFR Am: >60 mL/min (ref >60)
GFR, Estimated: 52 mL/min — ABNORMAL LOW (ref >60)
Glucose, Bld: 86 mg/dL (ref 70–99)
Potassium: 4.2 mmol/L (ref 3.5–5.1)
Sodium: 143 mmol/L (ref 135–145)
Total Bilirubin: 0.9 mg/dL (ref 0.3–1.2)
Total Protein: 5.3 g/dL — ABNORMAL LOW (ref 6.5–8.1)

## 2019-12-29 MED ORDER — SODIUM CHLORIDE 0.9% FLUSH
10.0000 mL | INTRAVENOUS | Status: DC | PRN
  Administered 2019-12-29: 10 mL
  Filled 2019-12-29: qty 10

## 2019-12-29 MED ORDER — HEPARIN SOD (PORK) LOCK FLUSH 100 UNIT/ML IV SOLN
500.0000 [IU] | Freq: Once | INTRAVENOUS | Status: AC | PRN
  Administered 2019-12-29: 500 [IU]
  Filled 2019-12-29: qty 5

## 2020-01-05 ENCOUNTER — Inpatient Hospital Stay: Payer: Medicare PPO

## 2020-01-05 ENCOUNTER — Other Ambulatory Visit: Payer: Self-pay

## 2020-01-05 DIAGNOSIS — Z95828 Presence of other vascular implants and grafts: Secondary | ICD-10-CM

## 2020-01-05 DIAGNOSIS — C7951 Secondary malignant neoplasm of bone: Secondary | ICD-10-CM | POA: Diagnosis not present

## 2020-01-05 DIAGNOSIS — Z5112 Encounter for antineoplastic immunotherapy: Secondary | ICD-10-CM | POA: Diagnosis not present

## 2020-01-05 DIAGNOSIS — Z5111 Encounter for antineoplastic chemotherapy: Secondary | ICD-10-CM | POA: Diagnosis not present

## 2020-01-05 DIAGNOSIS — D6481 Anemia due to antineoplastic chemotherapy: Secondary | ICD-10-CM | POA: Diagnosis not present

## 2020-01-05 DIAGNOSIS — C3431 Malignant neoplasm of lower lobe, right bronchus or lung: Secondary | ICD-10-CM

## 2020-01-05 DIAGNOSIS — C7931 Secondary malignant neoplasm of brain: Secondary | ICD-10-CM | POA: Diagnosis not present

## 2020-01-05 DIAGNOSIS — T451X5A Adverse effect of antineoplastic and immunosuppressive drugs, initial encounter: Secondary | ICD-10-CM | POA: Diagnosis not present

## 2020-01-05 DIAGNOSIS — Z7689 Persons encountering health services in other specified circumstances: Secondary | ICD-10-CM | POA: Diagnosis not present

## 2020-01-05 DIAGNOSIS — R5383 Other fatigue: Secondary | ICD-10-CM | POA: Diagnosis not present

## 2020-01-05 LAB — CMP (CANCER CENTER ONLY)
ALT: 20 U/L (ref 0–44)
AST: 15 U/L (ref 15–41)
Albumin: 3.3 g/dL — ABNORMAL LOW (ref 3.5–5.0)
Alkaline Phosphatase: 87 U/L (ref 38–126)
Anion gap: 11 (ref 5–15)
BUN: 12 mg/dL (ref 8–23)
CO2: 24 mmol/L (ref 22–32)
Calcium: 8.6 mg/dL — ABNORMAL LOW (ref 8.9–10.3)
Chloride: 109 mmol/L (ref 98–111)
Creatinine: 1.3 mg/dL — ABNORMAL HIGH (ref 0.44–1.00)
GFR, Est AFR Am: 49 mL/min — ABNORMAL LOW (ref >60)
GFR, Estimated: 42 mL/min — ABNORMAL LOW (ref >60)
Glucose, Bld: 86 mg/dL (ref 70–99)
Potassium: 3.5 mmol/L (ref 3.5–5.1)
Sodium: 144 mmol/L (ref 135–145)
Total Bilirubin: 0.3 mg/dL (ref 0.3–1.2)
Total Protein: 5.5 g/dL — ABNORMAL LOW (ref 6.5–8.1)

## 2020-01-05 LAB — CBC WITH DIFFERENTIAL (CANCER CENTER ONLY)
Abs Immature Granulocytes: 0.54 10*3/uL — ABNORMAL HIGH (ref 0.00–0.07)
Basophils Absolute: 0 10*3/uL (ref 0.0–0.1)
Basophils Relative: 0 %
Eosinophils Absolute: 0 10*3/uL (ref 0.0–0.5)
Eosinophils Relative: 0 %
HCT: 23.5 % — ABNORMAL LOW (ref 36.0–46.0)
Hemoglobin: 7.7 g/dL — ABNORMAL LOW (ref 12.0–15.0)
Immature Granulocytes: 8 %
Lymphocytes Relative: 14 %
Lymphs Abs: 1 10*3/uL (ref 0.7–4.0)
MCH: 32.9 pg (ref 26.0–34.0)
MCHC: 32.8 g/dL (ref 30.0–36.0)
MCV: 100.4 fL — ABNORMAL HIGH (ref 80.0–100.0)
Monocytes Absolute: 0.7 10*3/uL (ref 0.1–1.0)
Monocytes Relative: 10 %
Neutro Abs: 4.6 10*3/uL (ref 1.7–7.7)
Neutrophils Relative %: 68 %
Platelet Count: 11 10*3/uL — ABNORMAL LOW (ref 150–400)
RBC: 2.34 MIL/uL — ABNORMAL LOW (ref 3.87–5.11)
RDW: 15.4 % (ref 11.5–15.5)
WBC Count: 6.9 10*3/uL (ref 4.0–10.5)
nRBC: 0 % (ref 0.0–0.2)

## 2020-01-05 LAB — TSH: TSH: 0.084 u[IU]/mL — ABNORMAL LOW (ref 0.308–3.960)

## 2020-01-05 MED ORDER — HEPARIN SOD (PORK) LOCK FLUSH 100 UNIT/ML IV SOLN
500.0000 [IU] | Freq: Once | INTRAVENOUS | Status: AC | PRN
  Administered 2020-01-05: 500 [IU]
  Filled 2020-01-05: qty 5

## 2020-01-05 MED ORDER — SODIUM CHLORIDE 0.9% FLUSH
10.0000 mL | INTRAVENOUS | Status: DC | PRN
  Administered 2020-01-05: 10 mL
  Filled 2020-01-05: qty 10

## 2020-01-06 ENCOUNTER — Other Ambulatory Visit: Payer: Self-pay | Admitting: Medical Oncology

## 2020-01-06 ENCOUNTER — Telehealth: Payer: Self-pay | Admitting: Medical Oncology

## 2020-01-06 ENCOUNTER — Telehealth: Payer: Self-pay | Admitting: Internal Medicine

## 2020-01-06 DIAGNOSIS — D649 Anemia, unspecified: Secondary | ICD-10-CM

## 2020-01-06 DIAGNOSIS — D696 Thrombocytopenia, unspecified: Secondary | ICD-10-CM

## 2020-01-06 NOTE — Telephone Encounter (Signed)
Pt  Is not having any bleeding -reviewed bleeding precautions. Schedule message sent for blood products appt.

## 2020-01-06 NOTE — Telephone Encounter (Signed)
Scheduled appt per 6/22 sch message - pt is aware of appt.

## 2020-01-08 ENCOUNTER — Inpatient Hospital Stay: Payer: Medicare PPO

## 2020-01-08 ENCOUNTER — Other Ambulatory Visit: Payer: Self-pay | Admitting: *Deleted

## 2020-01-08 ENCOUNTER — Other Ambulatory Visit: Payer: Self-pay

## 2020-01-08 VITALS — BP 144/93 | HR 81 | Temp 98.5°F | Resp 17 | Ht 66.0 in | Wt 176.4 lb

## 2020-01-08 DIAGNOSIS — Z95828 Presence of other vascular implants and grafts: Secondary | ICD-10-CM

## 2020-01-08 DIAGNOSIS — D649 Anemia, unspecified: Secondary | ICD-10-CM

## 2020-01-08 DIAGNOSIS — Z7689 Persons encountering health services in other specified circumstances: Secondary | ICD-10-CM | POA: Diagnosis not present

## 2020-01-08 DIAGNOSIS — C7951 Secondary malignant neoplasm of bone: Secondary | ICD-10-CM | POA: Diagnosis not present

## 2020-01-08 DIAGNOSIS — C7931 Secondary malignant neoplasm of brain: Secondary | ICD-10-CM | POA: Diagnosis not present

## 2020-01-08 DIAGNOSIS — D6481 Anemia due to antineoplastic chemotherapy: Secondary | ICD-10-CM | POA: Diagnosis not present

## 2020-01-08 DIAGNOSIS — C3431 Malignant neoplasm of lower lobe, right bronchus or lung: Secondary | ICD-10-CM

## 2020-01-08 DIAGNOSIS — Z5111 Encounter for antineoplastic chemotherapy: Secondary | ICD-10-CM | POA: Diagnosis not present

## 2020-01-08 DIAGNOSIS — D696 Thrombocytopenia, unspecified: Secondary | ICD-10-CM

## 2020-01-08 DIAGNOSIS — T451X5A Adverse effect of antineoplastic and immunosuppressive drugs, initial encounter: Secondary | ICD-10-CM | POA: Diagnosis not present

## 2020-01-08 DIAGNOSIS — R5383 Other fatigue: Secondary | ICD-10-CM | POA: Diagnosis not present

## 2020-01-08 DIAGNOSIS — Z5112 Encounter for antineoplastic immunotherapy: Secondary | ICD-10-CM | POA: Diagnosis not present

## 2020-01-08 LAB — CBC WITH DIFFERENTIAL (CANCER CENTER ONLY)
Abs Immature Granulocytes: 0.32 10*3/uL — ABNORMAL HIGH (ref 0.00–0.07)
Basophils Absolute: 0.1 10*3/uL (ref 0.0–0.1)
Basophils Relative: 1 %
Eosinophils Absolute: 0 10*3/uL (ref 0.0–0.5)
Eosinophils Relative: 0 %
HCT: 23.4 % — ABNORMAL LOW (ref 36.0–46.0)
Hemoglobin: 7.5 g/dL — ABNORMAL LOW (ref 12.0–15.0)
Immature Granulocytes: 4 %
Lymphocytes Relative: 8 %
Lymphs Abs: 0.8 10*3/uL (ref 0.7–4.0)
MCH: 32.1 pg (ref 26.0–34.0)
MCHC: 32.1 g/dL (ref 30.0–36.0)
MCV: 100 fL (ref 80.0–100.0)
Monocytes Absolute: 0.8 10*3/uL (ref 0.1–1.0)
Monocytes Relative: 8 %
Neutro Abs: 7.3 10*3/uL (ref 1.7–7.7)
Neutrophils Relative %: 79 %
Platelet Count: 32 10*3/uL — ABNORMAL LOW (ref 150–400)
RBC: 2.34 MIL/uL — ABNORMAL LOW (ref 3.87–5.11)
RDW: 15.8 % — ABNORMAL HIGH (ref 11.5–15.5)
WBC Count: 9.2 10*3/uL (ref 4.0–10.5)
nRBC: 0.2 % (ref 0.0–0.2)

## 2020-01-08 LAB — CMP (CANCER CENTER ONLY)
ALT: 14 U/L (ref 0–44)
AST: 13 U/L — ABNORMAL LOW (ref 15–41)
Albumin: 3.3 g/dL — ABNORMAL LOW (ref 3.5–5.0)
Alkaline Phosphatase: 83 U/L (ref 38–126)
Anion gap: 9 (ref 5–15)
BUN: 12 mg/dL (ref 8–23)
CO2: 23 mmol/L (ref 22–32)
Calcium: 8.3 mg/dL — ABNORMAL LOW (ref 8.9–10.3)
Chloride: 108 mmol/L (ref 98–111)
Creatinine: 1.26 mg/dL — ABNORMAL HIGH (ref 0.44–1.00)
GFR, Est AFR Am: 51 mL/min — ABNORMAL LOW (ref >60)
GFR, Estimated: 44 mL/min — ABNORMAL LOW (ref >60)
Glucose, Bld: 95 mg/dL (ref 70–99)
Potassium: 3.4 mmol/L — ABNORMAL LOW (ref 3.5–5.1)
Sodium: 140 mmol/L (ref 135–145)
Total Bilirubin: 0.3 mg/dL (ref 0.3–1.2)
Total Protein: 5.5 g/dL — ABNORMAL LOW (ref 6.5–8.1)

## 2020-01-08 LAB — PREPARE RBC (CROSSMATCH)

## 2020-01-08 MED ORDER — SODIUM CHLORIDE 0.9 % IV SOLN
Freq: Once | INTRAVENOUS | Status: AC
  Filled 2020-01-08: qty 250

## 2020-01-08 MED ORDER — ACETAMINOPHEN 325 MG PO TABS
650.0000 mg | ORAL_TABLET | Freq: Once | ORAL | Status: AC
  Administered 2020-01-08: 650 mg via ORAL

## 2020-01-08 MED ORDER — SODIUM CHLORIDE 0.9% FLUSH
10.0000 mL | INTRAVENOUS | Status: DC | PRN
  Administered 2020-01-08: 10 mL
  Filled 2020-01-08: qty 10

## 2020-01-08 MED ORDER — HEPARIN SOD (PORK) LOCK FLUSH 100 UNIT/ML IV SOLN
500.0000 [IU] | Freq: Once | INTRAVENOUS | Status: AC | PRN
  Administered 2020-01-08: 500 [IU]
  Filled 2020-01-08: qty 5

## 2020-01-08 MED ORDER — ACETAMINOPHEN 325 MG PO TABS
ORAL_TABLET | ORAL | Status: AC
  Filled 2020-01-08: qty 2

## 2020-01-08 MED ORDER — DIPHENHYDRAMINE HCL 25 MG PO CAPS
ORAL_CAPSULE | ORAL | Status: AC
  Filled 2020-01-08: qty 1

## 2020-01-08 MED ORDER — DIPHENHYDRAMINE HCL 25 MG PO CAPS
25.0000 mg | ORAL_CAPSULE | Freq: Once | ORAL | Status: AC
  Administered 2020-01-08: 25 mg via ORAL

## 2020-01-08 NOTE — Patient Instructions (Signed)

## 2020-01-08 NOTE — Patient Instructions (Signed)

## 2020-01-09 LAB — BPAM RBC
Blood Product Expiration Date: 202107032359
Blood Product Expiration Date: 202107062359
ISSUE DATE / TIME: 202106240957
ISSUE DATE / TIME: 202106240957
Unit Type and Rh: 600
Unit Type and Rh: 600

## 2020-01-09 LAB — TYPE AND SCREEN
ABO/RH(D): A POS
Antibody Screen: NEGATIVE
Unit division: 0
Unit division: 0

## 2020-01-09 MED FILL — Dexamethasone Sodium Phosphate Inj 100 MG/10ML: INTRAMUSCULAR | Qty: 1 | Status: AC

## 2020-01-09 MED FILL — Fosaprepitant Dimeglumine For IV Infusion 150 MG (Base Eq): INTRAVENOUS | Qty: 5 | Status: AC

## 2020-01-09 NOTE — Progress Notes (Signed)
[facility] OFFICE PROGRESS NOTE  Rutherford Guys, MD [address] [name] [address]  DIAGNOSIS: Metastatic small cell lung cancer initially diagnosed asLimited stage (T2b, N2,M0) small cell lung cancer, pending further staging work-up diagnosed and January 2020. She presented with right lower lobe lung mass in addition to right hilar and mediastinal lymphadenopathy.She has evidence of metastatic disease to the brain in March 2021.  PRIOR THERAPY: 1)Systemic chemotherapy concurrent with radiation. Chemotherapy withcisplatin 80 mg/M2 on day 1 and etoposide 100 mg/M2 on days 1, 2 and 3 every 3 weeks.Last dose5/20/20.Status post5cycles.Dose reducedstarting fromcycle #4to cisplatin 40 mg/m2 and etoposide 80 mg/m2 due to renal insufficiency. 2)whole brain irradiation under the care of Dr. Sondra Come.Last treatment 10/21/19  CURRENT THERAPY: Systemic chemotherapy with carboplatin for AUC of 5 on day 1, etoposide 100 mg/M2 on days 1, 2 and 3 with Neulasta support in addition to Imfinzi 1500 mg IV every 3 weeks during chemotherapy. First dose on 11/03/19. Status post 3 cycles.Her dose was reduced to carboplatin for an AUC of 4 and etoposide 80 mg/m2 starting from cycle #2 due to pancytopenia.   INTERVAL HISTORY: [name] 69 y.o. female returns to the clinic for a follow up visit accompanied by her significant other. The patient is feeling well today without any concerning complaints. She received 2 units of blood last week (6/24) and is feeling better since that time. The patient continues to tolerate treatment  well without any adverse effects except for significant pancytopenia often requiring dose reductions and supportive platelet/blood transfusions. She denies any abnormal bleeding or bruising. Denies any fever, chills, night sweats, or weight loss. Denies any chest pain, shortness of breath, cough, or hemoptysis. Denies any nausea, vomiting, diarrhea, or  constipation except for an episode of nausea/vomiting last week associated with something she ate/drank. Denies any headache or visual changes except she has visual blurring close up which are corrected wearing reading glasses. She follows closely with Dr. Mickeal Skinner for her history of metastatic disease to the brain. She has a repeat brain MRI scheduled for 01/24/20. Denies any rashes or skin changes. The patient is here today for evaluation prior to starting cycle # 4  MEDICAL HISTORY: Past Medical History:  Diagnosis Date  . Arthritis   . Chronic pain   . Hyperlipidemia   . Hypertension   . Low blood magnesium 10/04/2018  . lung ca dx'd 06/2018  . Stroke ([place])    06/19/2018 minor    ALLERGIES:  has No Known Allergies.  MEDICATIONS:  Current Outpatient Medications  Medication Sig Dispense Refill  . acetaminophen (TYLENOL) 650 MG CR tablet Take 650 mg by mouth every 8 (eight) hours as needed for pain.    Marland Kitchen atorvastatin (LIPITOR) 80 MG tablet TAKE 1 TABLET (80 MG TOTAL) BY MOUTH DAILY AT 6 PM. 90 tablet 3  . Clobetasol Prop Emollient Base (CLOBETASOL PROPIONATE E) 0.05 % emollient cream Apply 1 application topically 2 (two) times daily. 60 g 3  . dexamethasone (DECADRON) 1 MG tablet Take 2mg  in AM for #7 days, then decrease to 1mg  in AM 60 tablet 0  . DULoxetine (CYMBALTA) 30 MG capsule TAKE 1 CAPSULE BY MOUTH EVERY DAY (Patient taking differently: Take 30 mg by mouth daily. ) 90 capsule 2  . pregabalin (LYRICA) 50 MG capsule TAKE 1 CAPSULE 3 TIMES A DAY 90 capsule 2  . prochlorperazine (COMPAZINE) 10 MG tablet Take 1 tablet (10 mg total) by mouth every 6 (six) hours as needed for  nausea or vomiting. 30 tablet 0  . traMADol (ULTRAM) 50 MG tablet Take 1-2 tablets (50-100 mg total) by mouth at bedtime as needed. 30 tablet 1  . XARELTO 20 MG TABS tablet TAKE 1 TABLET BY MOUTH EVERY DAY WITH SUPPER 90 tablet 0  . amLODipine (NORVASC) 10 MG tablet Take 1 tablet (10 mg total) by mouth daily as  needed. (Patient not taking: Reported on 12/22/2019)    . potassium chloride SA (KLOR-CON) 20 MEQ tablet Take 1 tablet (20 mEq total) by mouth daily. (Patient not taking: Reported on 01/12/2020) 7 tablet 0   Current Facility-Administered Medications  Medication Dose Route Frequency Provider Last Rate Last Admin  . sodium chloride flush (NS) 0.9 % injection 10 mL  10 mL Intravenous PRN Maythe Deramo L, PA-C   10 mL at 01/12/20 1147    SURGICAL HISTORY:  Past Surgical History:  Procedure Laterality Date  . BIOPSY  06/19/2018   Procedure: BIOPSY;  Surgeon: Laurence Spates, MD;  Location: [facility];  Service: Endoscopy;;  . BIOPSY  10/11/2018   Procedure: BIOPSY;  Surgeon: Laurence Spates, MD;  Location: [facility];  Service: Endoscopy;;  . ENDARTERECTOMY Right 06/26/2018   Procedure: ENDARTERECTOMY CAROTID RIGHT;  Surgeon: Serafina Mitchell, MD;  Location: [facility] OR;  Service: Vascular;  Laterality: Right;  . ESOPHAGOGASTRODUODENOSCOPY (EGD) WITH PROPOFOL N/A 06/19/2018   Procedure: ESOPHAGOGASTRODUODENOSCOPY (EGD) WITH PROPOFOL;  Surgeon: Laurence Spates, MD;  Location: [facility];  Service: Endoscopy;  Laterality: N/A;  . FLEXIBLE SIGMOIDOSCOPY N/A 10/11/2018   Procedure: FLEXIBLE SIGMOIDOSCOPY;  Surgeon: Laurence Spates, MD;  Location: [facility];  Service: Endoscopy;  Laterality: N/A;  . IR IMAGING GUIDED PORT INSERTION  10/07/2018  . PATCH ANGIOPLASTY Right 06/26/2018   Procedure: PATCH ANGIOPLASTY USING A XENOSURE 1cm x 6cm BIOLOGIC PATCH;  Surgeon: Serafina Mitchell, MD;  Location: [facility];  Service: Vascular;  Laterality: Right;  Marland Kitchen VIDEO BRONCHOSCOPY WITH ENDOBRONCHIAL ULTRASOUND N/A 08/14/2018   Procedure: VIDEO BRONCHOSCOPY WITH ENDOBRONCHIAL ULTRASOUND;  Surgeon: Collene Gobble, MD;  Location: [facility] OR;  Service: Thoracic;  Laterality: N/A;    REVIEW OF SYSTEMS:   Review of Systems  Constitutional: Negative for appetite change, chills, fatigue, fever and unexpected weight change.   HENT:   Negative for mouth sores, nosebleeds, sore throat and trouble swallowing.   Eyes: Negative for eye problems and icterus.  Respiratory: Negative for cough, hemoptysis, shortness of breath and wheezing.   Cardiovascular: Negative for chest pain and leg swelling.  Gastrointestinal: Negative for abdominal pain, constipation, diarrhea, nausea and vomiting.  Genitourinary: Negative for bladder incontinence, difficulty urinating, dysuria, frequency and hematuria.   Musculoskeletal: Positive for chronic back pain. Negative for gait problem, neck pain and neck stiffness.  Skin: Negative for itching and rash.  Neurological: Negative for dizziness, extremity weakness, gait problem, headaches, light-headedness and seizures.  Hematological: Negative for adenopathy. Does not bruise/bleed easily.  Psychiatric/Behavioral: Negative for confusion, depression and sleep disturbance. The patient is not nervous/anxious.     PHYSICAL EXAMINATION:  Blood pressure (!) 156/91, pulse 91, temperature 97.7 F (36.5 C), temperature source Temporal, resp. rate 18, weight 178 lb 9.6 oz (81 kg), SpO2 100 %.  ECOG PERFORMANCE STATUS: 1 - Symptomatic but completely ambulatory  Physical Exam  Constitutional: Oriented to person, place, and time and well-developed, well-nourished, and in no distress.  HENT:  Head: Normocephalic and atraumatic.  Mouth/Throat: Oropharynx is clear and moist. No oropharyngeal exudate.  Eyes: Conjunctivae are normal. Right eye exhibits no discharge. Left  eye exhibits no discharge. No scleral icterus.  Neck: Normal range of motion. Neck supple.  Cardiovascular: Normal rate, regular rhythm, normal heart sounds and intact distal pulses.   Pulmonary/Chest: Effort normal and breath sounds normal. No respiratory distress. No wheezes. No rales.  Abdominal: Soft. Bowel sounds are normal. Exhibits no distension and no mass. There is no tenderness.  Musculoskeletal: Normal range of motion.  Exhibits no edema.  Lymphadenopathy:    No cervical adenopathy.  Neurological: Alert and oriented to person, place, and time. Exhibits normal muscle tone. Examined in the wheelchair.  Skin: Skin is warm and dry. No rash noted. Not diaphoretic. No erythema. No pallor.  Psychiatric: Mood, memory and judgment normal.  Vitals reviewed.  LABORATORY DATA: Lab Results  Component Value Date   WBC 9.6 01/12/2020   HGB 9.1 (L) 01/12/2020   HCT 28.4 (L) 01/12/2020   MCV 94.0 01/12/2020   PLT 70 (L) 01/12/2020      Chemistry      Component Value Date/Time   NA 144 01/12/2020 1032   NA 141 10/13/2019 1558   K 3.6 01/12/2020 1032   CL 109 01/12/2020 1032   CO2 23 01/12/2020 1032   BUN 10 01/12/2020 1032   BUN 26 10/13/2019 1558   CREATININE 1.13 (H) 01/12/2020 1032      Component Value Date/Time   CALCIUM 8.9 01/12/2020 1032   ALKPHOS 73 01/12/2020 1032   AST 10 (L) 01/12/2020 1032   ALT 11 01/12/2020 1032   BILITOT 0.4 01/12/2020 1032       RADIOGRAPHIC STUDIES:  CT Abdomen Pelvis Wo Contrast  Result Date: 12/19/2019 CLINICAL DATA:  Follow-up metastatic small cell lung carcinoma. Ongoing chemotherapy. Evaluate treatment response. EXAM: CT CHEST, ABDOMEN AND PELVIS WITHOUT CONTRAST TECHNIQUE: Multidetector CT imaging of the chest, abdomen and pelvis was performed following the standard protocol without IV contrast. COMPARISON:  Chest CT on 10/24/2019, and AP CT on 10/29/2018 FINDINGS: CT CHEST FINDINGS Cardiovascular: No acute findings. Aortic and coronary artery atherosclerosis noted. Stable mild pericardial thickening. Mediastinum/Lymph Nodes: Enlarged right paratracheal lymph node shows no change measuring 3.2 cm on image 18/2. 1.3 cm subcarinal lymph node also shows no significant change. Mild right hilar lymphadenopathy also appears stable enhanced exam. No new or increased sites of identified. Lungs/Pleura: Stable post radiation changes seen in the medial right lung. A persistent  nodular density is seen in the central right lower lobe surrounded by parenchymal scarring and associated traction bronchiectasis. This measures approximately 2.4 x 2.3 cm on image 75/4, without significant change in size compared to previous study. No new or enlarging pulmonary nodules or masses are identified. Stable mild right pleural thickening, without change. Musculoskeletal:  No suspicious bone lesions identified. CT ABDOMEN AND PELVIS FINDINGS Hepatobiliary: No masses visualized on this unenhanced exam. Gallbladder is unremarkable. No evidence of biliary ductal dilatation. Pancreas: No mass or inflammatory changes identified on this unenhanced exam. Spleen:  Within normal limits in size. Adrenals/Urinary Tract: Stable 1 cm left adrenal nodule, consistent with benign adenoma. No evidence of urolithiasis or hydronephrosis. Unremarkable appearance of bladder. Stomach/Bowel: No evidence of obstruction, inflammatory process, or abnormal fluid collections. Vascular/Lymphatic: No pathologically enlarged lymph nodes identified. No abdominal aortic aneurysm. Aortic atherosclerosis noted. Reproductive:  No masses or other significant abnormality. Other:  None. Musculoskeletal: Ill-defined areas of sclerosis are again seen involving the right posterior elements of T5 and T6 vertebra, without change. No other suspicious bone lesions identified. IMPRESSION: 1. Stable central right lower lobe pulmonary nodule,  and surrounding post radiation changes. 2. Stable mediastinal and right hilar lymphadenopathy. 3. Stable ill-defined sclerosis involving the right posterior elements of T5 and T6. Differential diagnosis includes sclerotic bone metastases and post radiation changes. 4. No new or progressive metastatic disease within the chest, abdomen, or pelvis. Aortic Atherosclerosis (ICD10-I70.0). Electronically Signed   By: Marlaine Hind M.D.   On: 12/19/2019 12:38   CT Chest Wo Contrast  Result Date: 12/19/2019 CLINICAL DATA:   Follow-up metastatic small cell lung carcinoma. Ongoing chemotherapy. Evaluate treatment response. EXAM: CT CHEST, ABDOMEN AND PELVIS WITHOUT CONTRAST TECHNIQUE: Multidetector CT imaging of the chest, abdomen and pelvis was performed following the standard protocol without IV contrast. COMPARISON:  Chest CT on 10/24/2019, and AP CT on 10/29/2018 FINDINGS: CT CHEST FINDINGS Cardiovascular: No acute findings. Aortic and coronary artery atherosclerosis noted. Stable mild pericardial thickening. Mediastinum/Lymph Nodes: Enlarged right paratracheal lymph node shows no change measuring 3.2 cm on image 18/2. 1.3 cm subcarinal lymph node also shows no significant change. Mild right hilar lymphadenopathy also appears stable enhanced exam. No new or increased sites of identified. Lungs/Pleura: Stable post radiation changes seen in the medial right lung. A persistent nodular density is seen in the central right lower lobe surrounded by parenchymal scarring and associated traction bronchiectasis. This measures approximately 2.4 x 2.3 cm on image 75/4, without significant change in size compared to previous study. No new or enlarging pulmonary nodules or masses are identified. Stable mild right pleural thickening, without change. Musculoskeletal:  No suspicious bone lesions identified. CT ABDOMEN AND PELVIS FINDINGS Hepatobiliary: No masses visualized on this unenhanced exam. Gallbladder is unremarkable. No evidence of biliary ductal dilatation. Pancreas: No mass or inflammatory changes identified on this unenhanced exam. Spleen:  Within normal limits in size. Adrenals/Urinary Tract: Stable 1 cm left adrenal nodule, consistent with benign adenoma. No evidence of urolithiasis or hydronephrosis. Unremarkable appearance of bladder. Stomach/Bowel: No evidence of obstruction, inflammatory process, or abnormal fluid collections. Vascular/Lymphatic: No pathologically enlarged lymph nodes identified. No abdominal aortic aneurysm. Aortic  atherosclerosis noted. Reproductive:  No masses or other significant abnormality. Other:  None. Musculoskeletal: Ill-defined areas of sclerosis are again seen involving the right posterior elements of T5 and T6 vertebra, without change. No other suspicious bone lesions identified. IMPRESSION: 1. Stable central right lower lobe pulmonary nodule, and surrounding post radiation changes. 2. Stable mediastinal and right hilar lymphadenopathy. 3. Stable ill-defined sclerosis involving the right posterior elements of T5 and T6. Differential diagnosis includes sclerotic bone metastases and post radiation changes. 4. No new or progressive metastatic disease within the chest, abdomen, or pelvis. Aortic Atherosclerosis (ICD10-I70.0). Electronically Signed   By: Marlaine Hind M.D.   On: 12/19/2019 12:38     ASSESSMENT/PLAN:  This is a very pleasant 69year old Caucasian female who was initially diagnosed with limited small cell lung cancer in January 2020. She initially presented with a right lower lobe lung mass status post 5 cycles of systemic chemotherapy with Cisplatin and Etoposide. Her dose was reduced starting from cycle #4 due to renal insuffiencey. She also had concurrent radiation.   She was found to have extensive stage disease in March 2021 with the development of metastatic disease to the brain. This was treated with whole brain irradiation under the care of Dr. Sondra Come which was completed in April 2021.   She is currently undergoing treatment withpalliative systemic chemotherapy with carboplatin for AUC of 5 on day 1, etoposide 100 mg/M2 on days 1, 2 and 3 with Neulasta support in  addition to Imfinzi 1500 mg IV every 3 weeks. Her dose was reduced to carboplatin for an AUC of 4 and etoposide 80 mg/m2 starting from cycle 2 due to pancytopenia.  She is status post 3 cycles and experiences pancytopenia requiring a platelet and blood transfusions.   Her most recent transfusion was on 6/24 with 2 units of  RBCs  The patient was seen with Dr. Julien Nordmann. Labs were reviewed. Her platelet count is 70k today. We will delay her treatment by 1 week. If her platelet count is >100k next week, and if her other labs are within parameters, then she may proceed with treatment with cycle #4.    I will arrange for a restaging CT scan prior to her next cycle of treatment.   We will see her back for a follow up visit in 4 weeks before starting cycle #5  The patient was advised to call immediately if she has any concerning symptoms in the interval. The patient voices understanding of current disease status and treatment options and is in agreement with the current care plan. All questions were answered. The patient knows to call the clinic with any problems, questions or concerns. We can certainly see the patient much sooner if necessary     Orders Placed This Encounter  Procedures  . CT Chest W Contrast    Standing Status:   Future    Standing Expiration Date:   01/11/2021    Order Specific Question:   ** REASON FOR EXAM (FREE TEXT)    Answer:   restaging Lung cancer    Order Specific Question:   If indicated for the ordered procedure, I authorize the administration of contrast media per Radiology protocol    Answer:   Yes    Order Specific Question:   Preferred imaging location?    Answer:   [facility]    Order Specific Question:   Radiology Contrast Protocol - do NOT remove file path    Answer:   [email or web address]  . CT Abdomen Pelvis W Contrast    Standing Status:   Future    Standing Expiration Date:   01/11/2021    Order Specific Question:   ** REASON FOR EXAM (FREE TEXT)    Answer:   Restaging lung Cancer    Order Specific Question:   If indicated for the ordered procedure, I authorize the administration of contrast media per Radiology protocol    Answer:   Yes    Order Specific Question:   Preferred imaging location?    Answer:   [facility]    Order  Specific Question:   Is Oral Contrast requested for this exam?    Answer:   Yes, Per Radiology protocol    Order Specific Question:   Radiology Contrast Protocol - do NOT remove file path    Answer:   [email or web address]  . CBC with Differential ([place] Only)    Standing Status:   Standing    Number of Occurrences:   4    Standing Expiration Date:   01/11/2021  . CMP ([place] only)    Standing Status:   Standing    Number of Occurrences:   4    Standing Expiration Date:   01/11/2021     Tobe Sos Willean Schurman, PA-C 01/12/20   ADDENDUM: Hematology/Oncology Attending: I had a face-to-face encounter with the patient today.  I recommended her care plan.  This is a very pleasant 69 years old white female with recurrent  small cell lung cancer and she is currently undergoing systemic chemotherapy with reduced dose carboplatin for AUC of 4 on day 1 and etoposide 80 mg/M2 on days 1, 2 and 3 with Neulasta support as well as Imfinzi every 3 weeks.  The patient tolerated the last cycle of her treatment well except for the pancytopenia.  She received 2 units of PRBCs transfusion last week.  She was supposed to start cycle #4 today but unfortunately her platelets count are still low at 70,000. I recommended for the patient to delay the start of cycle number 4 by 1 week until improvement of her platelets count. We will arrange for the patient to come back for follow-up visit in 4 weeks for evaluation with repeat CT scan of the chest, abdomen pelvis for restaging of her disease. For the chemotherapy-induced anemia, we will continue to monitor her blood count closely and consider the patient for transfusion if needed. For the hypertension she was advised to take her blood pressure medication as prescribed and to monitor it closely at home. The patient was advised to call immediately if she has any other concerning symptoms in the interval.  Disclaimer: This note was  dictated with voice recognition software. Similar sounding words can inadvertently be transcribed and may be missed upon review. Eilleen Kempf, MD 01/12/20

## 2020-01-12 ENCOUNTER — Inpatient Hospital Stay: Payer: Medicare PPO | Admitting: Physician Assistant

## 2020-01-12 ENCOUNTER — Encounter: Payer: Self-pay | Admitting: Physician Assistant

## 2020-01-12 ENCOUNTER — Other Ambulatory Visit: Payer: Self-pay

## 2020-01-12 ENCOUNTER — Inpatient Hospital Stay: Payer: Medicare PPO

## 2020-01-12 VITALS — BP 156/91 | HR 91 | Temp 97.7°F | Resp 18 | Wt 178.6 lb

## 2020-01-12 DIAGNOSIS — C7931 Secondary malignant neoplasm of brain: Secondary | ICD-10-CM | POA: Diagnosis not present

## 2020-01-12 DIAGNOSIS — C3431 Malignant neoplasm of lower lobe, right bronchus or lung: Secondary | ICD-10-CM

## 2020-01-12 DIAGNOSIS — Z95828 Presence of other vascular implants and grafts: Secondary | ICD-10-CM

## 2020-01-12 DIAGNOSIS — T451X5A Adverse effect of antineoplastic and immunosuppressive drugs, initial encounter: Secondary | ICD-10-CM | POA: Diagnosis not present

## 2020-01-12 DIAGNOSIS — Z7689 Persons encountering health services in other specified circumstances: Secondary | ICD-10-CM | POA: Diagnosis not present

## 2020-01-12 DIAGNOSIS — D6481 Anemia due to antineoplastic chemotherapy: Secondary | ICD-10-CM | POA: Diagnosis not present

## 2020-01-12 DIAGNOSIS — R5383 Other fatigue: Secondary | ICD-10-CM | POA: Diagnosis not present

## 2020-01-12 DIAGNOSIS — C7951 Secondary malignant neoplasm of bone: Secondary | ICD-10-CM | POA: Diagnosis not present

## 2020-01-12 DIAGNOSIS — Z5112 Encounter for antineoplastic immunotherapy: Secondary | ICD-10-CM | POA: Diagnosis not present

## 2020-01-12 DIAGNOSIS — D649 Anemia, unspecified: Secondary | ICD-10-CM

## 2020-01-12 DIAGNOSIS — Z5111 Encounter for antineoplastic chemotherapy: Secondary | ICD-10-CM | POA: Diagnosis not present

## 2020-01-12 LAB — CBC WITH DIFFERENTIAL (CANCER CENTER ONLY)
Abs Immature Granulocytes: 0.06 10*3/uL (ref 0.00–0.07)
Basophils Absolute: 0 10*3/uL (ref 0.0–0.1)
Basophils Relative: 0 %
Eosinophils Absolute: 0 10*3/uL (ref 0.0–0.5)
Eosinophils Relative: 0 %
HCT: 28.4 % — ABNORMAL LOW (ref 36.0–46.0)
Hemoglobin: 9.1 g/dL — ABNORMAL LOW (ref 12.0–15.0)
Immature Granulocytes: 1 %
Lymphocytes Relative: 4 %
Lymphs Abs: 0.4 10*3/uL — ABNORMAL LOW (ref 0.7–4.0)
MCH: 30.1 pg (ref 26.0–34.0)
MCHC: 32 g/dL (ref 30.0–36.0)
MCV: 94 fL (ref 80.0–100.0)
Monocytes Absolute: 0.7 10*3/uL (ref 0.1–1.0)
Monocytes Relative: 7 %
Neutro Abs: 8.4 10*3/uL — ABNORMAL HIGH (ref 1.7–7.7)
Neutrophils Relative %: 88 %
Platelet Count: 70 10*3/uL — ABNORMAL LOW (ref 150–400)
RBC: 3.02 MIL/uL — ABNORMAL LOW (ref 3.87–5.11)
RDW: 19.1 % — ABNORMAL HIGH (ref 11.5–15.5)
WBC Count: 9.6 10*3/uL (ref 4.0–10.5)
nRBC: 0 % (ref 0.0–0.2)

## 2020-01-12 LAB — CMP (CANCER CENTER ONLY)
ALT: 11 U/L (ref 0–44)
AST: 10 U/L — ABNORMAL LOW (ref 15–41)
Albumin: 3.1 g/dL — ABNORMAL LOW (ref 3.5–5.0)
Alkaline Phosphatase: 73 U/L (ref 38–126)
Anion gap: 12 (ref 5–15)
BUN: 10 mg/dL (ref 8–23)
CO2: 23 mmol/L (ref 22–32)
Calcium: 8.9 mg/dL (ref 8.9–10.3)
Chloride: 109 mmol/L (ref 98–111)
Creatinine: 1.13 mg/dL — ABNORMAL HIGH (ref 0.44–1.00)
GFR, Est AFR Am: 58 mL/min — ABNORMAL LOW (ref >60)
GFR, Estimated: 50 mL/min — ABNORMAL LOW (ref >60)
Glucose, Bld: 94 mg/dL (ref 70–99)
Potassium: 3.6 mmol/L (ref 3.5–5.1)
Sodium: 144 mmol/L (ref 135–145)
Total Bilirubin: 0.4 mg/dL (ref 0.3–1.2)
Total Protein: 5.7 g/dL — ABNORMAL LOW (ref 6.5–8.1)

## 2020-01-12 LAB — TYPE AND SCREEN
ABO/RH(D): A POS
Antibody Screen: NEGATIVE

## 2020-01-12 MED ORDER — SODIUM CHLORIDE 0.9% FLUSH
10.0000 mL | INTRAVENOUS | Status: DC | PRN
  Administered 2020-01-12: 10 mL via INTRAVENOUS
  Filled 2020-01-12: qty 10

## 2020-01-12 MED ORDER — SODIUM CHLORIDE 0.9% FLUSH
10.0000 mL | INTRAVENOUS | Status: DC | PRN
  Administered 2020-01-12: 10 mL
  Filled 2020-01-12: qty 10

## 2020-01-12 MED ORDER — HEPARIN SOD (PORK) LOCK FLUSH 100 UNIT/ML IV SOLN
500.0000 [IU] | Freq: Once | INTRAVENOUS | Status: AC
  Administered 2020-01-12: 500 [IU] via INTRAVENOUS
  Filled 2020-01-12: qty 5

## 2020-01-13 ENCOUNTER — Inpatient Hospital Stay: Payer: Medicare PPO

## 2020-01-13 ENCOUNTER — Other Ambulatory Visit: Payer: Self-pay | Admitting: Radiation Therapy

## 2020-01-14 ENCOUNTER — Inpatient Hospital Stay: Payer: Medicare PPO

## 2020-01-14 ENCOUNTER — Encounter: Payer: Self-pay | Admitting: Internal Medicine

## 2020-01-18 ENCOUNTER — Other Ambulatory Visit: Payer: Self-pay | Admitting: Family Medicine

## 2020-01-18 NOTE — Telephone Encounter (Signed)
Requested Prescriptions  Pending Prescriptions Disp Refills  . DULoxetine (CYMBALTA) 30 MG capsule [Pharmacy Med Name: DULOXETINE HCL DR 30 MG CAP] 90 capsule 1    Sig: TAKE 1 CAPSULE BY MOUTH EVERY DAY     Psychiatry: Antidepressants - SNRI Failed - 01/18/2020  9:11 AM      Failed - Last BP in normal range    BP Readings from Last 1 Encounters:  01/12/20 (!) 156/91         Passed - Valid encounter within last 6 months    Recent Outpatient Visits          1 month ago Metastatic small cell carcinoma to brain [facility])   [facility], Lilia Argue, MD   3 months ago Metastatic small cell carcinoma to brain [facility])   [facility], Lilia Argue, MD   3 months ago Metastatic small cell carcinoma to brain [facility])   [facility], Lilia Argue, MD   4 months ago Neuropathy   [facility], Lilia Argue, MD   7 months ago Neuropathy   [facility], Lilia Argue, MD

## 2020-01-19 ENCOUNTER — Other Ambulatory Visit: Payer: Self-pay | Admitting: Internal Medicine

## 2020-01-20 ENCOUNTER — Other Ambulatory Visit: Payer: Self-pay

## 2020-01-20 ENCOUNTER — Inpatient Hospital Stay: Payer: Medicare PPO

## 2020-01-20 ENCOUNTER — Other Ambulatory Visit: Payer: Self-pay | Admitting: Internal Medicine

## 2020-01-20 ENCOUNTER — Inpatient Hospital Stay: Payer: Medicare PPO | Attending: Internal Medicine

## 2020-01-20 VITALS — BP 125/81 | HR 75

## 2020-01-20 DIAGNOSIS — C7931 Secondary malignant neoplasm of brain: Secondary | ICD-10-CM | POA: Diagnosis not present

## 2020-01-20 DIAGNOSIS — Z79899 Other long term (current) drug therapy: Secondary | ICD-10-CM | POA: Diagnosis not present

## 2020-01-20 DIAGNOSIS — Z7901 Long term (current) use of anticoagulants: Secondary | ICD-10-CM | POA: Diagnosis not present

## 2020-01-20 DIAGNOSIS — C3431 Malignant neoplasm of lower lobe, right bronchus or lung: Secondary | ICD-10-CM

## 2020-01-20 DIAGNOSIS — Z8673 Personal history of transient ischemic attack (TIA), and cerebral infarction without residual deficits: Secondary | ICD-10-CM | POA: Diagnosis not present

## 2020-01-20 DIAGNOSIS — Z5111 Encounter for antineoplastic chemotherapy: Secondary | ICD-10-CM | POA: Insufficient documentation

## 2020-01-20 DIAGNOSIS — C7951 Secondary malignant neoplasm of bone: Secondary | ICD-10-CM | POA: Diagnosis not present

## 2020-01-20 DIAGNOSIS — Z5112 Encounter for antineoplastic immunotherapy: Secondary | ICD-10-CM | POA: Insufficient documentation

## 2020-01-20 DIAGNOSIS — Z86711 Personal history of pulmonary embolism: Secondary | ICD-10-CM | POA: Diagnosis not present

## 2020-01-20 DIAGNOSIS — E785 Hyperlipidemia, unspecified: Secondary | ICD-10-CM | POA: Insufficient documentation

## 2020-01-20 DIAGNOSIS — Z7952 Long term (current) use of systemic steroids: Secondary | ICD-10-CM | POA: Diagnosis not present

## 2020-01-20 DIAGNOSIS — Z5189 Encounter for other specified aftercare: Secondary | ICD-10-CM | POA: Insufficient documentation

## 2020-01-20 DIAGNOSIS — R5383 Other fatigue: Secondary | ICD-10-CM | POA: Insufficient documentation

## 2020-01-20 DIAGNOSIS — M1990 Unspecified osteoarthritis, unspecified site: Secondary | ICD-10-CM | POA: Insufficient documentation

## 2020-01-20 DIAGNOSIS — I10 Essential (primary) hypertension: Secondary | ICD-10-CM | POA: Insufficient documentation

## 2020-01-20 DIAGNOSIS — R0609 Other forms of dyspnea: Secondary | ICD-10-CM | POA: Diagnosis not present

## 2020-01-20 LAB — CMP (CANCER CENTER ONLY)
ALT: 8 U/L (ref 0–44)
AST: 12 U/L — ABNORMAL LOW (ref 15–41)
Albumin: 3.6 g/dL (ref 3.5–5.0)
Alkaline Phosphatase: 56 U/L (ref 38–126)
Anion gap: 8 (ref 5–15)
BUN: 11 mg/dL (ref 8–23)
CO2: 27 mmol/L (ref 22–32)
Calcium: 9 mg/dL (ref 8.9–10.3)
Chloride: 107 mmol/L (ref 98–111)
Creatinine: 1.23 mg/dL — ABNORMAL HIGH (ref 0.44–1.00)
GFR, Est AFR Am: 53 mL/min — ABNORMAL LOW (ref >60)
GFR, Estimated: 45 mL/min — ABNORMAL LOW (ref >60)
Glucose, Bld: 95 mg/dL (ref 70–99)
Potassium: 3.7 mmol/L (ref 3.5–5.1)
Sodium: 142 mmol/L (ref 135–145)
Total Bilirubin: 0.4 mg/dL (ref 0.3–1.2)
Total Protein: 5.6 g/dL — ABNORMAL LOW (ref 6.5–8.1)

## 2020-01-20 LAB — CBC WITH DIFFERENTIAL (CANCER CENTER ONLY)
Abs Immature Granulocytes: 0.03 10*3/uL (ref 0.00–0.07)
Basophils Absolute: 0 10*3/uL (ref 0.0–0.1)
Basophils Relative: 0 %
Eosinophils Absolute: 0 10*3/uL (ref 0.0–0.5)
Eosinophils Relative: 0 %
HCT: 28.8 % — ABNORMAL LOW (ref 36.0–46.0)
Hemoglobin: 9 g/dL — ABNORMAL LOW (ref 12.0–15.0)
Immature Granulocytes: 0 %
Lymphocytes Relative: 10 %
Lymphs Abs: 0.6 10*3/uL — ABNORMAL LOW (ref 0.7–4.0)
MCH: 30.9 pg (ref 26.0–34.0)
MCHC: 31.3 g/dL (ref 30.0–36.0)
MCV: 99 fL (ref 80.0–100.0)
Monocytes Absolute: 0.6 10*3/uL (ref 0.1–1.0)
Monocytes Relative: 9 %
Neutro Abs: 5.4 10*3/uL (ref 1.7–7.7)
Neutrophils Relative %: 81 %
Platelet Count: 152 10*3/uL (ref 150–400)
RBC: 2.91 MIL/uL — ABNORMAL LOW (ref 3.87–5.11)
RDW: 20.5 % — ABNORMAL HIGH (ref 11.5–15.5)
WBC Count: 6.7 10*3/uL (ref 4.0–10.5)
nRBC: 0 % (ref 0.0–0.2)

## 2020-01-20 MED ORDER — SODIUM CHLORIDE 0.9% FLUSH
10.0000 mL | INTRAVENOUS | Status: DC | PRN
  Administered 2020-01-20: 10 mL
  Filled 2020-01-20: qty 10

## 2020-01-20 MED ORDER — SODIUM CHLORIDE 0.9 % IV SOLN
364.0000 mg | Freq: Once | INTRAVENOUS | Status: AC
  Administered 2020-01-20: 360 mg via INTRAVENOUS
  Filled 2020-01-20: qty 36

## 2020-01-20 MED ORDER — CLONIDINE HCL 0.1 MG PO TABS: 0.2000 mg | ORAL_TABLET | Freq: Once | ORAL | Status: DC

## 2020-01-20 MED ORDER — PALONOSETRON HCL INJECTION 0.25 MG/5ML
0.2500 mg | Freq: Once | INTRAVENOUS | Status: AC
  Administered 2020-01-20: 0.25 mg via INTRAVENOUS

## 2020-01-20 MED ORDER — HEPARIN SOD (PORK) LOCK FLUSH 100 UNIT/ML IV SOLN
500.0000 [IU] | Freq: Once | INTRAVENOUS | Status: AC | PRN
  Administered 2020-01-20: 500 [IU]
  Filled 2020-01-20: qty 5

## 2020-01-20 MED ORDER — CLONIDINE HCL 0.1 MG PO TABS
ORAL_TABLET | ORAL | Status: AC
  Filled 2020-01-20: qty 1

## 2020-01-20 MED ORDER — SODIUM CHLORIDE 0.9 % IV SOLN
1500.0000 mg | Freq: Once | INTRAVENOUS | Status: AC
  Administered 2020-01-20: 1500 mg via INTRAVENOUS
  Filled 2020-01-20: qty 30

## 2020-01-20 MED ORDER — SODIUM CHLORIDE 0.9 % IV SOLN
150.0000 mg | Freq: Once | INTRAVENOUS | Status: AC
  Administered 2020-01-20: 150 mg via INTRAVENOUS
  Filled 2020-01-20: qty 150

## 2020-01-20 MED ORDER — CLONIDINE HCL 0.1 MG PO TABS
0.1000 mg | ORAL_TABLET | Freq: Once | ORAL | Status: AC
  Administered 2020-01-20: 0.1 mg via ORAL

## 2020-01-20 MED ORDER — SODIUM CHLORIDE 0.9 % IV SOLN
10.0000 mg | Freq: Once | INTRAVENOUS | Status: AC
  Administered 2020-01-20: 10 mg via INTRAVENOUS
  Filled 2020-01-20: qty 10

## 2020-01-20 MED ORDER — SODIUM CHLORIDE 0.9 % IV SOLN
80.0000 mg/m2 | Freq: Once | INTRAVENOUS | Status: AC
  Administered 2020-01-20: 160 mg via INTRAVENOUS
  Filled 2020-01-20: qty 8

## 2020-01-20 MED ORDER — PALONOSETRON HCL INJECTION 0.25 MG/5ML
INTRAVENOUS | Status: AC
  Filled 2020-01-20: qty 5

## 2020-01-20 MED ORDER — SODIUM CHLORIDE 0.9 % IV SOLN
Freq: Once | INTRAVENOUS | Status: AC
  Filled 2020-01-20: qty 250

## 2020-01-20 NOTE — Telephone Encounter (Signed)
Rx request, please review

## 2020-01-20 NOTE — Patient Instructions (Signed)
[facility] Discharge Instructions for Patients Receiving Chemotherapy  Today you received the following chemotherapy agents: Imfinzi/Carboplatin/Etoposide  To help prevent nausea and vomiting after your treatment, we encourage you to take your nausea medication as directed.   If you develop nausea and vomiting that is not controlled by your nausea medication, call the clinic.   BELOW ARE SYMPTOMS THAT SHOULD BE REPORTED IMMEDIATELY:  *FEVER GREATER THAN 100.5 F  *CHILLS WITH OR WITHOUT FEVER  NAUSEA AND VOMITING THAT IS NOT CONTROLLED WITH YOUR NAUSEA MEDICATION  *UNUSUAL SHORTNESS OF BREATH  *UNUSUAL BRUISING OR BLEEDING  TENDERNESS IN MOUTH AND THROAT WITH OR WITHOUT PRESENCE OF ULCERS  *URINARY PROBLEMS  *BOWEL PROBLEMS  UNUSUAL RASH Items with * indicate a potential emergency and should be followed up as soon as possible.  Feel free to call the clinic should you have any questions or concerns. The clinic phone number is [phone].  Please show the [facility] at check-in to the Emergency Department and triage nurse.

## 2020-01-20 NOTE — Patient Instructions (Signed)
Implanted Port Insertion, Care After °This sheet gives you information about how to care for yourself after your procedure. Your health care provider may also give you more specific instructions. If you have problems or questions, contact your health care provider. °What can I expect after the procedure? °After the procedure, it is common to have: °· Discomfort at the port insertion site. °· Bruising on the skin over the port. This should improve over 3-4 days. °Follow these instructions at home: °Port care °· After your port is placed, you will get a manufacturer's information card. The card has information about your port. Keep this card with you at all times. °· Take care of the port as told by your health care provider. Ask your health care provider if you or a family member can get training for taking care of the port at home. A home health care nurse may also take care of the port. °· Make sure to remember what type of port you have. °Incision care ° °  ° °· Follow instructions from your health care provider about how to take care of your port insertion site. Make sure you: °? Wash your hands with soap and water before and after you change your bandage (dressing). If soap and water are not available, use hand sanitizer. °? Change your dressing as told by your health care provider. °? Leave stitches (sutures), skin glue, or adhesive strips in place. These skin closures may need to stay in place for 2 weeks or longer. If adhesive strip edges start to loosen and curl up, you may trim the loose edges. Do not remove adhesive strips completely unless your health care provider tells you to do that. °· Check your port insertion site every day for signs of infection. Check for: °? Redness, swelling, or pain. °? Fluid or blood. °? Warmth. °? Pus or a bad smell. °Activity °· Return to your normal activities as told by your health care provider. Ask your health care provider what activities are safe for you. °· Do not  lift anything that is heavier than 10 lb (4.5 kg), or the limit that you are told, until your health care provider says that it is safe. °General instructions °· Take over-the-counter and prescription medicines only as told by your health care provider. °· Do not take baths, swim, or use a hot tub until your health care provider approves. Ask your health care provider if you may take showers. You may only be allowed to take sponge baths. °· Do not drive for 24 hours if you were given a sedative during your procedure. °· Wear a medical alert bracelet in case of an emergency. This will tell any health care providers that you have a port. °· Keep all follow-up visits as told by your health care provider. This is important. °Contact a health care provider if: °· You cannot flush your port with saline as directed, or you cannot draw blood from the port. °· You have a fever or chills. °· You have redness, swelling, or pain around your port insertion site. °· You have fluid or blood coming from your port insertion site. °· Your port insertion site feels warm to the touch. °· You have pus or a bad smell coming from the port insertion site. °Get help right away if: °· You have chest pain or shortness of breath. °· You have bleeding from your port that you cannot control. °Summary °· Take care of the port as told by your health   care provider. Keep the manufacturer's information card with you at all times. °· Change your dressing as told by your health care provider. °· Contact a health care provider if you have a fever or chills or if you have redness, swelling, or pain around your port insertion site. °· Keep all follow-up visits as told by your health care provider. °This information is not intended to replace advice given to you by your health care provider. Make sure you discuss any questions you have with your health care provider. °Document Revised: 01/29/2018 Document Reviewed: 01/29/2018 °Elsevier Patient Education ©  2020 [employer] ° °

## 2020-01-20 NOTE — Progress Notes (Signed)
Per Dr Julien Nordmann, ok to treat with elevated BP after giving Clonidine. dph

## 2020-01-20 NOTE — Progress Notes (Signed)
Patient BP 183/108, rechecked after 5 minutes. First ready was 180/107. Patient states it was elevated when she was at home this morning as well, states she did not take her BP medications this morning. Denies any pain today. Charge Rn notified. Dr.Mohamed patient here for treatment only today

## 2020-01-21 ENCOUNTER — Inpatient Hospital Stay: Payer: Medicare PPO

## 2020-01-21 VITALS — BP 127/100 | HR 66 | Temp 99.4°F | Resp 20

## 2020-01-21 DIAGNOSIS — C3431 Malignant neoplasm of lower lobe, right bronchus or lung: Secondary | ICD-10-CM

## 2020-01-21 DIAGNOSIS — Z5112 Encounter for antineoplastic immunotherapy: Secondary | ICD-10-CM | POA: Diagnosis not present

## 2020-01-21 DIAGNOSIS — C7931 Secondary malignant neoplasm of brain: Secondary | ICD-10-CM | POA: Diagnosis not present

## 2020-01-21 DIAGNOSIS — R0609 Other forms of dyspnea: Secondary | ICD-10-CM | POA: Diagnosis not present

## 2020-01-21 DIAGNOSIS — C7951 Secondary malignant neoplasm of bone: Secondary | ICD-10-CM | POA: Diagnosis not present

## 2020-01-21 DIAGNOSIS — R5383 Other fatigue: Secondary | ICD-10-CM | POA: Diagnosis not present

## 2020-01-21 DIAGNOSIS — Z5189 Encounter for other specified aftercare: Secondary | ICD-10-CM | POA: Diagnosis not present

## 2020-01-21 DIAGNOSIS — I10 Essential (primary) hypertension: Secondary | ICD-10-CM | POA: Diagnosis not present

## 2020-01-21 DIAGNOSIS — Z5111 Encounter for antineoplastic chemotherapy: Secondary | ICD-10-CM | POA: Diagnosis not present

## 2020-01-21 MED ORDER — SODIUM CHLORIDE 0.9 % IV SOLN
80.0000 mg/m2 | Freq: Once | INTRAVENOUS | Status: AC
  Administered 2020-01-21: 160 mg via INTRAVENOUS
  Filled 2020-01-21: qty 8

## 2020-01-21 MED ORDER — HEPARIN SOD (PORK) LOCK FLUSH 100 UNIT/ML IV SOLN
500.0000 [IU] | Freq: Once | INTRAVENOUS | Status: AC | PRN
  Administered 2020-01-21: 500 [IU]
  Filled 2020-01-21: qty 5

## 2020-01-21 MED ORDER — SODIUM CHLORIDE 0.9 % IV SOLN
Freq: Once | INTRAVENOUS | Status: AC
  Filled 2020-01-21: qty 250

## 2020-01-21 MED ORDER — SODIUM CHLORIDE 0.9% FLUSH
10.0000 mL | INTRAVENOUS | Status: DC | PRN
  Administered 2020-01-21: 10 mL
  Filled 2020-01-21: qty 10

## 2020-01-21 MED ORDER — SODIUM CHLORIDE 0.9 % IV SOLN
10.0000 mg | Freq: Once | INTRAVENOUS | Status: AC
  Administered 2020-01-21: 10 mg via INTRAVENOUS
  Filled 2020-01-21: qty 10

## 2020-01-21 NOTE — Patient Instructions (Signed)
Etoposide, VP-16 injection What is this medicine? ETOPOSIDE, VP-16 (e toe POE side) is a chemotherapy drug. It is used to treat testicular cancer, lung cancer, and other cancers. This medicine may be used for other purposes; ask your health care provider or pharmacist if you have questions. COMMON BRAND NAME(S): Etopophos, Toposar, VePesid What should I tell my health care provider before I take this medicine? They need to know if you have any of these conditions:  infection  kidney disease  liver disease  low blood counts, like low white cell, platelet, or red cell counts  an unusual or allergic reaction to etoposide, other medicines, foods, dyes, or preservatives  pregnant or trying to get pregnant  breast-feeding How should I use this medicine? This medicine is for infusion into a vein. It is administered in a hospital or clinic by a specially trained health care professional. Talk to your pediatrician regarding the use of this medicine in children. Special care may be needed. Overdosage: If you think you have taken too much of this medicine contact a poison control center or emergency room at once. NOTE: This medicine is only for you. Do not share this medicine with others. What if I miss a dose? It is important not to miss your dose. Call your doctor or health care professional if you are unable to keep an appointment. What may interact with this medicine? This medicine may interact with the following medications:  warfarin This list may not describe all possible interactions. Give your health care provider a list of all the medicines, herbs, non-prescription drugs, or dietary supplements you use. Also tell them if you smoke, drink alcohol, or use illegal drugs. Some items may interact with your medicine. What should I watch for while using this medicine? Visit your doctor for checks on your progress. This drug may make you feel generally unwell. This is not uncommon, as  chemotherapy can affect healthy cells as well as cancer cells. Report any side effects. Continue your course of treatment even though you feel ill unless your doctor tells you to stop. In some cases, you may be given additional medicines to help with side effects. Follow all directions for their use. Call your doctor or health care professional for advice if you get a fever, chills or sore throat, or other symptoms of a cold or flu. Do not treat yourself. This drug decreases your body's ability to fight infections. Try to avoid being around people who are sick. This medicine may increase your risk to bruise or bleed. Call your doctor or health care professional if you notice any unusual bleeding. Talk to your doctor about your risk of cancer. You may be more at risk for certain types of cancers if you take this medicine. Do not become pregnant while taking this medicine or for at least 6 months after stopping it. Women should inform their doctor if they wish to become pregnant or think they might be pregnant. Women of child-bearing potential will need to have a negative pregnancy test before starting this medicine. There is a potential for serious side effects to an unborn child. Talk to your health care professional or pharmacist for more information. Do not breast-feed an infant while taking this medicine. Men must use a latex condom during sexual contact with a woman while taking this medicine and for at least 4 months after stopping it. A latex condom is needed even if you have had a vasectomy. Contact your doctor right away if your partner  becomes pregnant. Do not donate sperm while taking this medicine and for at least 4 months after you stop taking this medicine. Men should inform their doctors if they wish to father a child. This medicine may lower sperm counts. What side effects may I notice from receiving this medicine? Side effects that you should report to your doctor or health care professional  as soon as possible:  allergic reactions like skin rash, itching or hives, swelling of the face, lips, or tongue  low blood counts - this medicine may decrease the number of white blood cells, red blood cells, and platelets. You may be at increased risk for infections and bleeding  nausea, vomiting  redness, blistering, peeling or loosening of the skin, including inside the mouth  signs and symptoms of infection like fever; chills; cough; sore throat; pain or trouble passing urine  signs and symptoms of low red blood cells or anemia such as unusually weak or tired; feeling faint or lightheaded; falls; breathing problems  unusual bruising or bleeding Side effects that usually do not require medical attention (report to your doctor or health care professional if they continue or are bothersome):  changes in taste  diarrhea  hair loss  loss of appetite  mouth sores This list may not describe all possible side effects. Call your doctor for medical advice about side effects. You may report side effects to FDA at 1-800-FDA-1088. Where should I keep my medicine? This drug is given in a hospital or clinic and will not be stored at home. NOTE: This sheet is a summary. It may not cover all possible information. If you have questions about this medicine, talk to your doctor, pharmacist, or health care provider.  2020 [employer] Standard (2018-08-28 16:57:15)

## 2020-01-22 ENCOUNTER — Other Ambulatory Visit: Payer: Self-pay

## 2020-01-22 ENCOUNTER — Inpatient Hospital Stay: Payer: Medicare PPO

## 2020-01-22 VITALS — BP 161/89 | HR 70 | Temp 98.4°F | Resp 16

## 2020-01-22 DIAGNOSIS — C7931 Secondary malignant neoplasm of brain: Secondary | ICD-10-CM

## 2020-01-22 DIAGNOSIS — R0609 Other forms of dyspnea: Secondary | ICD-10-CM | POA: Diagnosis not present

## 2020-01-22 DIAGNOSIS — Z5112 Encounter for antineoplastic immunotherapy: Secondary | ICD-10-CM | POA: Diagnosis not present

## 2020-01-22 DIAGNOSIS — Z5189 Encounter for other specified aftercare: Secondary | ICD-10-CM | POA: Diagnosis not present

## 2020-01-22 DIAGNOSIS — C3431 Malignant neoplasm of lower lobe, right bronchus or lung: Secondary | ICD-10-CM | POA: Diagnosis not present

## 2020-01-22 DIAGNOSIS — R5383 Other fatigue: Secondary | ICD-10-CM | POA: Diagnosis not present

## 2020-01-22 DIAGNOSIS — C7951 Secondary malignant neoplasm of bone: Secondary | ICD-10-CM | POA: Diagnosis not present

## 2020-01-22 DIAGNOSIS — I10 Essential (primary) hypertension: Secondary | ICD-10-CM | POA: Diagnosis not present

## 2020-01-22 DIAGNOSIS — Z5111 Encounter for antineoplastic chemotherapy: Secondary | ICD-10-CM | POA: Diagnosis not present

## 2020-01-22 MED ORDER — HEPARIN SOD (PORK) LOCK FLUSH 100 UNIT/ML IV SOLN
500.0000 [IU] | Freq: Once | INTRAVENOUS | Status: AC | PRN
  Administered 2020-01-22: 500 [IU]
  Filled 2020-01-22: qty 5

## 2020-01-22 MED ORDER — SODIUM CHLORIDE 0.9% FLUSH
10.0000 mL | INTRAVENOUS | Status: DC | PRN
  Administered 2020-01-22: 10 mL
  Filled 2020-01-22: qty 10

## 2020-01-22 MED ORDER — PEGFILGRASTIM 6 MG/0.6ML ~~LOC~~ PSKT
PREFILLED_SYRINGE | SUBCUTANEOUS | Status: AC
  Filled 2020-01-22: qty 0.6

## 2020-01-22 MED ORDER — SODIUM CHLORIDE 0.9 % IV SOLN
Freq: Once | INTRAVENOUS | Status: AC
  Filled 2020-01-22: qty 250

## 2020-01-22 MED ORDER — SODIUM CHLORIDE 0.9 % IV SOLN
10.0000 mg | Freq: Once | INTRAVENOUS | Status: AC
  Administered 2020-01-22: 10 mg via INTRAVENOUS
  Filled 2020-01-22: qty 10

## 2020-01-22 MED ORDER — PEGFILGRASTIM 6 MG/0.6ML ~~LOC~~ PSKT
6.0000 mg | PREFILLED_SYRINGE | Freq: Once | SUBCUTANEOUS | Status: AC
  Administered 2020-01-22: 6 mg via SUBCUTANEOUS

## 2020-01-22 MED ORDER — SODIUM CHLORIDE 0.9 % IV SOLN
80.0000 mg/m2 | Freq: Once | INTRAVENOUS | Status: AC
  Administered 2020-01-22: 160 mg via INTRAVENOUS
  Filled 2020-01-22: qty 8

## 2020-01-24 ENCOUNTER — Other Ambulatory Visit: Payer: Self-pay

## 2020-01-24 ENCOUNTER — Ambulatory Visit (HOSPITAL_COMMUNITY)
Admission: RE | Admit: 2020-01-24 | Discharge: 2020-01-24 | Disposition: A | Discharge status: Home / Self Care | Payer: Medicare PPO | Source: Ambulatory Visit | Attending: Internal Medicine | Admitting: Internal Medicine

## 2020-01-24 ENCOUNTER — Ambulatory Visit: Payer: Medicare PPO

## 2020-01-24 DIAGNOSIS — C7931 Secondary malignant neoplasm of brain: Secondary | ICD-10-CM | POA: Diagnosis not present

## 2020-01-24 DIAGNOSIS — G319 Degenerative disease of nervous system, unspecified: Secondary | ICD-10-CM | POA: Diagnosis not present

## 2020-01-24 DIAGNOSIS — G930 Cerebral cysts: Secondary | ICD-10-CM | POA: Diagnosis not present

## 2020-01-24 DIAGNOSIS — G936 Cerebral edema: Secondary | ICD-10-CM | POA: Diagnosis not present

## 2020-01-24 MED ORDER — GADOBUTROL 1 MMOL/ML IV SOLN
8.0000 mL | Freq: Once | INTRAVENOUS | Status: AC | PRN
  Administered 2020-01-24: 8 mL via INTRAVENOUS

## 2020-01-26 ENCOUNTER — Inpatient Hospital Stay: Payer: Medicare PPO

## 2020-01-26 ENCOUNTER — Other Ambulatory Visit: Payer: Self-pay

## 2020-01-26 ENCOUNTER — Other Ambulatory Visit: Payer: Self-pay | Admitting: *Deleted

## 2020-01-26 ENCOUNTER — Inpatient Hospital Stay: Payer: Medicare PPO | Admitting: Internal Medicine

## 2020-01-26 VITALS — BP 148/92 | HR 85 | Temp 98.1°F | Resp 18 | Ht 66.0 in | Wt 181.5 lb

## 2020-01-26 DIAGNOSIS — R0609 Other forms of dyspnea: Secondary | ICD-10-CM | POA: Diagnosis not present

## 2020-01-26 DIAGNOSIS — Z5189 Encounter for other specified aftercare: Secondary | ICD-10-CM | POA: Diagnosis not present

## 2020-01-26 DIAGNOSIS — D649 Anemia, unspecified: Secondary | ICD-10-CM

## 2020-01-26 DIAGNOSIS — C3431 Malignant neoplasm of lower lobe, right bronchus or lung: Secondary | ICD-10-CM

## 2020-01-26 DIAGNOSIS — C7951 Secondary malignant neoplasm of bone: Secondary | ICD-10-CM | POA: Diagnosis not present

## 2020-01-26 DIAGNOSIS — I10 Essential (primary) hypertension: Secondary | ICD-10-CM | POA: Diagnosis not present

## 2020-01-26 DIAGNOSIS — C7931 Secondary malignant neoplasm of brain: Secondary | ICD-10-CM

## 2020-01-26 DIAGNOSIS — R5383 Other fatigue: Secondary | ICD-10-CM | POA: Diagnosis not present

## 2020-01-26 DIAGNOSIS — Z5112 Encounter for antineoplastic immunotherapy: Secondary | ICD-10-CM | POA: Diagnosis not present

## 2020-01-26 DIAGNOSIS — Z5111 Encounter for antineoplastic chemotherapy: Secondary | ICD-10-CM | POA: Diagnosis not present

## 2020-01-26 LAB — CBC WITH DIFFERENTIAL (CANCER CENTER ONLY)
Abs Immature Granulocytes: 0 10*3/uL (ref 0.00–0.07)
Band Neutrophils: 1 %
Basophils Absolute: 0 10*3/uL (ref 0.0–0.1)
Basophils Relative: 0 %
Eosinophils Absolute: 0 10*3/uL (ref 0.0–0.5)
Eosinophils Relative: 0 %
HCT: 25.2 % — ABNORMAL LOW (ref 36.0–46.0)
Hemoglobin: 7.9 g/dL — ABNORMAL LOW (ref 12.0–15.0)
Lymphocytes Relative: 5 %
Lymphs Abs: 0.8 10*3/uL (ref 0.7–4.0)
MCH: 31 pg (ref 26.0–34.0)
MCHC: 31.3 g/dL (ref 30.0–36.0)
MCV: 98.8 fL (ref 80.0–100.0)
Monocytes Absolute: 0 10*3/uL — ABNORMAL LOW (ref 0.1–1.0)
Monocytes Relative: 0 %
Neutro Abs: 15.9 10*3/uL — ABNORMAL HIGH (ref 1.7–7.7)
Neutrophils Relative %: 94 %
Platelet Count: 65 10*3/uL — ABNORMAL LOW (ref 150–400)
RBC: 2.55 MIL/uL — ABNORMAL LOW (ref 3.87–5.11)
RDW: 21.3 % — ABNORMAL HIGH (ref 11.5–15.5)
WBC Count: 16.7 10*3/uL — ABNORMAL HIGH (ref 4.0–10.5)
nRBC: 0 % (ref 0.0–0.2)

## 2020-01-26 LAB — CMP (CANCER CENTER ONLY)
ALT: 11 U/L (ref 0–44)
AST: 17 U/L (ref 15–41)
Albumin: 3 g/dL — ABNORMAL LOW (ref 3.5–5.0)
Alkaline Phosphatase: 70 U/L (ref 38–126)
Anion gap: 9 (ref 5–15)
BUN: 16 mg/dL (ref 8–23)
CO2: 25 mmol/L (ref 22–32)
Calcium: 8.4 mg/dL — ABNORMAL LOW (ref 8.9–10.3)
Chloride: 107 mmol/L (ref 98–111)
Creatinine: 1.09 mg/dL — ABNORMAL HIGH (ref 0.44–1.00)
GFR, Est AFR Am: >60 mL/min (ref >60)
GFR, Estimated: 52 mL/min — ABNORMAL LOW (ref >60)
Glucose, Bld: 80 mg/dL (ref 70–99)
Potassium: 3.7 mmol/L (ref 3.5–5.1)
Sodium: 141 mmol/L (ref 135–145)
Total Bilirubin: 0.9 mg/dL (ref 0.3–1.2)
Total Protein: 5.4 g/dL — ABNORMAL LOW (ref 6.5–8.1)

## 2020-01-26 LAB — TSH: TSH: 2.221 u[IU]/mL (ref 0.308–3.960)

## 2020-01-26 MED ORDER — HEPARIN SOD (PORK) LOCK FLUSH 100 UNIT/ML IV SOLN
500.0000 [IU] | Freq: Once | INTRAVENOUS | Status: AC
  Administered 2020-01-26: 500 [IU] via INTRAVENOUS
  Filled 2020-01-26: qty 5

## 2020-01-26 MED ORDER — SODIUM CHLORIDE 0.9% FLUSH
10.0000 mL | INTRAVENOUS | Status: DC | PRN
  Administered 2020-01-26: 10 mL via INTRAVENOUS
  Filled 2020-01-26: qty 10

## 2020-01-26 NOTE — Patient Instructions (Signed)
Port was flushed during Neuro Oncology visit with Dr. Mickeal Skinner today instead of tomorrow.

## 2020-01-26 NOTE — Progress Notes (Signed)
[place] [facility] [place], [place] [address] [phone]   Interval Evaluation  Date of Service: 01/26/20 Patient Name: [name] Patient MRN: [id number] Patient DOB: 11-Jul-1951 Provider: Ventura Sellers, MD  Identifying Statement:  [name] is a 69 y.o. female with Metastatic small cell carcinoma to brain [facility]) [C79.31]   Primary Cancer:  Oncologic History: Oncology History  Small cell carcinoma of lower lobe of right lung ([place])  08/28/2018 Initial Diagnosis   Small cell carcinoma of lower lobe of right lung ([place])   09/03/2018 - 12/22/2018 Chemotherapy   The patient had palonosetron (ALOXI) injection 0.25 mg, 0.25 mg, Intravenous,  Once, 5 of 6 cycles Administration: 0.25 mg (09/03/2018), 0.25 mg (12/02/2018), 0.25 mg (09/25/2018), 0.25 mg (10/16/2018), 0.25 mg (11/12/2018) pegfilgrastim (NEULASTA ONPRO KIT) injection 6 mg, 6 mg, Subcutaneous, Once, 4 of 5 cycles Administration: 6 mg (09/27/2018), 6 mg (10/18/2018), 6 mg (12/04/2018), 6 mg (11/14/2018) CISplatin (PLATINOL) 165 mg in sodium chloride 0.9 % 500 mL chemo infusion, 80 mg/m2 = 165 mg, Intravenous,  Once, 5 of 6 cycles Dose modification: 40 mg/m2 (original dose 80 mg/m2, Cycle 5, Reason: Change in SCr/CrCl) Administration: 165 mg (09/03/2018), 82 mg (12/02/2018), 165 mg (09/25/2018), 165 mg (10/16/2018), 82 mg (11/12/2018) etoposide (VEPESID) 200 mg in sodium chloride 0.9 % 500 mL chemo infusion, 97 mg/m2 = 210 mg, Intravenous,  Once, 5 of 6 cycles Dose modification: 80 mg/m2 (original dose 100 mg/m2, Cycle 5, Reason: Change in SCr/CrCl) Administration: 200 mg (09/03/2018), 200 mg (09/04/2018), 200 mg (09/05/2018), 160 mg (12/02/2018), 160 mg (12/03/2018), 160 mg (12/04/2018), 200 mg (09/25/2018), 200 mg (09/26/2018), 200 mg (09/27/2018), 200 mg (10/16/2018), 200 mg (10/17/2018), 200 mg (10/18/2018), 160 mg (11/12/2018), 160 mg (11/13/2018), 160 mg (11/14/2018) fosaprepitant (EMEND) 150 mg,  dexamethasone (DECADRON) 12 mg in sodium chloride 0.9 % 145 mL IVPB, , Intravenous,  Once, 5 of 6 cycles Administration:  (09/03/2018),  (12/02/2018),  (09/25/2018),  (10/16/2018),  (11/12/2018)  for chemotherapy treatment.    11/03/2019 -  Chemotherapy   The patient had palonosetron (ALOXI) injection 0.25 mg, 0.25 mg, Intravenous,  Once, 4 of 4 cycles Administration: 0.25 mg (11/03/2019), 0.25 mg (12/22/2019), 0.25 mg (01/20/2020), 0.25 mg (12/01/2019) pegfilgrastim (NEULASTA ONPRO KIT) injection 6 mg, 6 mg, Subcutaneous, Once, 4 of 4 cycles Administration: 6 mg (11/05/2019), 6 mg (12/03/2019), 6 mg (12/24/2019), 6 mg (01/22/2020) CARBOplatin (PARAPLATIN) 390 mg in sodium chloride 0.9 % 250 mL chemo infusion, 390 mg (100 % of original dose 389 mg), Intravenous,  Once, 4 of 4 cycles Dose modification: 389 mg (original dose 389 mg, Cycle 1), 324 mg (original dose 324 mg, Cycle 3), 364 mg (original dose 324 mg, Cycle 4, Reason: Provider Judgment) Administration: 390 mg (11/03/2019), 320 mg (12/22/2019), 360 mg (01/20/2020), 320 mg (12/01/2019) etoposide (VEPESID) 200 mg in sodium chloride 0.9 % 500 mL chemo infusion, 100 mg/m2 = 200 mg, Intravenous,  Once, 4 of 4 cycles Dose modification: 80 mg/m2 (original dose 100 mg/m2, Cycle 3, Reason: Dose not tolerated) Administration: 200 mg (11/03/2019), 200 mg (11/04/2019), 200 mg (11/05/2019), 160 mg (12/22/2019), 160 mg (12/23/2019), 160 mg (12/24/2019), 160 mg (01/20/2020), 160 mg (01/21/2020), 160 mg (01/22/2020), 160 mg (12/01/2019), 160 mg (12/02/2019), 160 mg (12/03/2019) fosaprepitant (EMEND) 150 mg in sodium chloride 0.9 % 145 mL IVPB, 150 mg, Intravenous,  Once, 4 of 4 cycles Administration: 150 mg (11/03/2019), 150 mg (12/22/2019), 150 mg (01/20/2020), 150 mg (12/01/2019) durvalumab (IMFINZI) 1,500  mg in sodium chloride 0.9 % 100 mL chemo infusion, 1,500 mg, Intravenous,  Once, 4 of 8 cycles Administration: 1,500 mg (11/03/2019), 1,500 mg (12/22/2019), 1,500 mg (01/20/2020), 1,500 mg (12/01/2019)   for chemotherapy treatment.    Metastatic small cell carcinoma to brain ([place])  10/07/2019 Initial Diagnosis   Metastatic small cell carcinoma to brain ([place])   11/03/2019 -  Chemotherapy   The patient had palonosetron (ALOXI) injection 0.25 mg, 0.25 mg, Intravenous,  Once, 4 of 4 cycles Administration: 0.25 mg (11/03/2019), 0.25 mg (12/22/2019), 0.25 mg (01/20/2020), 0.25 mg (12/01/2019) pegfilgrastim (NEULASTA ONPRO KIT) injection 6 mg, 6 mg, Subcutaneous, Once, 4 of 4 cycles Administration: 6 mg (11/05/2019), 6 mg (12/03/2019), 6 mg (12/24/2019), 6 mg (01/22/2020) CARBOplatin (PARAPLATIN) 390 mg in sodium chloride 0.9 % 250 mL chemo infusion, 390 mg (100 % of original dose 389 mg), Intravenous,  Once, 4 of 4 cycles Dose modification: 389 mg (original dose 389 mg, Cycle 1), 324 mg (original dose 324 mg, Cycle 3), 364 mg (original dose 324 mg, Cycle 4, Reason: Provider Judgment) Administration: 390 mg (11/03/2019), 320 mg (12/22/2019), 360 mg (01/20/2020), 320 mg (12/01/2019) etoposide (VEPESID) 200 mg in sodium chloride 0.9 % 500 mL chemo infusion, 100 mg/m2 = 200 mg, Intravenous,  Once, 4 of 4 cycles Dose modification: 80 mg/m2 (original dose 100 mg/m2, Cycle 3, Reason: Dose not tolerated) Administration: 200 mg (11/03/2019), 200 mg (11/04/2019), 200 mg (11/05/2019), 160 mg (12/22/2019), 160 mg (12/23/2019), 160 mg (12/24/2019), 160 mg (01/20/2020), 160 mg (01/21/2020), 160 mg (01/22/2020), 160 mg (12/01/2019), 160 mg (12/02/2019), 160 mg (12/03/2019) fosaprepitant (EMEND) 150 mg in sodium chloride 0.9 % 145 mL IVPB, 150 mg, Intravenous,  Once, 4 of 4 cycles Administration: 150 mg (11/03/2019), 150 mg (12/22/2019), 150 mg (01/20/2020), 150 mg (12/01/2019) durvalumab (IMFINZI) 1,500 mg in sodium chloride 0.9 % 100 mL chemo infusion, 1,500 mg, Intravenous,  Once, 4 of 8 cycles Administration: 1,500 mg (11/03/2019), 1,500 mg (12/22/2019), 1,500 mg (01/20/2020), 1,500 mg (12/01/2019)  for chemotherapy treatment.     CNS Oncologic  History 10/21/19: Completed WBRT ([place])  Interval History:  [name] presents today for follow up after recent MRI brain.  She has had relative improvement in dizziness and imbalance, and is currently dosing 58m decadron daily. Continues to ambulate with walker as prior, no falls, no seizures.  No changes to chemotherapy regimen, carbo+etoposide.  H+P (11/17/19) Patient presents today for evaluation following completion of whole brain radiation with Dr. KSondra Comeearlier this month.  She and her husband describe improvement in her cognition and balance from prior.  She is still relying on a rolling walker for ambulation around the home.  No issues with hand functionality or slurred speech.  No longer requiring steroids.  Take Lyrica and Cymbalta for neuropathic symptoms following initial chemotherapy. She is currently undergoing salvage chemotherapy with Dr. MJulien Nordmann has transfusion needed today because of cytopenias.  Medications: Current Outpatient Medications on File Prior to Visit  Medication Sig Dispense Refill  . acetaminophen (TYLENOL) 650 MG CR tablet Take 650 mg by mouth every 8 (eight) hours as needed for pain.    .Marland KitchenamLODipine (NORVASC) 10 MG tablet Take 1 tablet (10 mg total) by mouth daily as needed. (Patient not taking: Reported on 12/22/2019)    . atorvastatin (LIPITOR) 80 MG tablet TAKE 1 TABLET (80 MG TOTAL) BY MOUTH DAILY AT 6 PM. 90 tablet 3  . Clobetasol Prop Emollient Base (CLOBETASOL PROPIONATE E) 0.05 % emollient cream Apply 1 application  topically 2 (two) times daily. 60 g 3  . dexamethasone (DECADRON) 1 MG tablet Take 1 tablet (1 mg total) by mouth daily. TAKE 2 TABLETS EVERY MORNING FOR 7 DAYS THEN DECREASE TO 1 TABLET IN THE MORNING 30 tablet 0  . DULoxetine (CYMBALTA) 30 MG capsule TAKE 1 CAPSULE BY MOUTH EVERY DAY 90 capsule 1  . potassium chloride SA (KLOR-CON) 20 MEQ tablet Take 1 tablet (20 mEq total) by mouth daily. (Patient not taking: Reported on 01/12/2020) 7  tablet 0  . pregabalin (LYRICA) 50 MG capsule TAKE 1 CAPSULE 3 TIMES A DAY 90 capsule 2  . prochlorperazine (COMPAZINE) 10 MG tablet Take 1 tablet (10 mg total) by mouth every 6 (six) hours as needed for nausea or vomiting. 30 tablet 0  . traMADol (ULTRAM) 50 MG tablet Take 1-2 tablets (50-100 mg total) by mouth at bedtime as needed. 30 tablet 1  . XARELTO 20 MG TABS tablet TAKE 1 TABLET BY MOUTH EVERY DAY WITH SUPPER 90 tablet 0   Current Facility-Administered Medications on File Prior to Visit  Medication Dose Route Frequency Provider Last Rate Last Admin  . cloNIDine (CATAPRES) tablet 0.2 mg  0.2 mg Oral Once Curt Bears, MD        Allergies: No Known Allergies Past Medical History:  Past Medical History:  Diagnosis Date  . Arthritis   . Chronic pain   . Hyperlipidemia   . Hypertension   . Low blood magnesium 10/04/2018  . lung ca dx'd 06/2018  . Stroke [facility])    06/19/2018 minor   Past Surgical History:  Past Surgical History:  Procedure Laterality Date  . BIOPSY  06/19/2018   Procedure: BIOPSY;  Surgeon: Laurence Spates, MD;  Location: [facility] ENDOSCOPY;  Service: Endoscopy;;  . BIOPSY  10/11/2018   Procedure: BIOPSY;  Surgeon: Laurence Spates, MD;  Location: [facility] ENDOSCOPY;  Service: Endoscopy;;  . ENDARTERECTOMY Right 06/26/2018   Procedure: ENDARTERECTOMY CAROTID RIGHT;  Surgeon: Serafina Mitchell, MD;  Location[facility];  Service: Vascular;  Laterality: Right;  . ESOPHAGOGASTRODUODENOSCOPY (EGD) WITH PROPOFOL N/A 06/19/2018   Procedure: ESOPHAGOGASTRODUODENOSCOPY (EGD) WITH PROPOFOL;  Surgeon: Laurence Spates, MD;  Location: [facility] ENDOSCOPY;  Service: Endoscopy;  Laterality: N/A;  . FLEXIBLE SIGMOIDOSCOPY N/A 10/11/2018   Procedure: FLEXIBLE SIGMOIDOSCOPY;  Surgeon: Laurence Spates, MD;  Location: [facility] ENDOS[facility];  Service: Endoscopy;  Laterality: N/A;  . IR IMAGING GUIDED PORT INSERTION  10/07/2018  . PATCH ANGIOPLASTY Right 06/26/2018   Procedure: PATCH ANGIOPLASTY USING A XENOSURE 1cm x  6cm BIOLOGIC PATCH;  Surgeon: Serafina Mitchell, MD;  Location: [facility];  Service: Vascular;  Laterality: Right;  Marland Kitchen VIDEO BRONCHOSCOPY WITH ENDOBRONCHIAL ULTRASOUND N/A 08/14/2018   Procedure: VIDEO BRONCHOSCOPY WITH ENDOBRONCHIAL ULTRASOUND;  Surgeon: Collene Gobble, MD;  Location: [facility] OR;  Service: Thoracic;  Laterality: N/A;   Social History:  Social History   Socioeconomic History  . Marital status: Single    Spouse name: Not on file  . Number of children: 0  . Years of education: Not on file  . Highest education level: Not on file  Occupational History  . Not on file  Tobacco Use  . Smoking status: Former Smoker    Packs/day: 1.00    Years: 10.00    Pack years: 10.00    Types: Cigarettes    Quit date: 06/19/2018    Years since quitting: 1.6  . Smokeless tobacco: Never Used  . Tobacco comment: Currently using Nicorette Gum  Vaping Use  .  Vaping Use: Never used  Substance and Sexual Activity  . Alcohol use: Yes    Comment: wine 2 glasses a day   . Drug use: Never    Comment: Hemp Oil  . Sexual activity: Not Currently  Other Topics Concern  . 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 Difficulty of Paying Living Expenses:   Food Insecurity:   . Worried About Charity fundraiser in the Last Year:   . Arboriculturist in the Last Year:   Transportation Needs:   . Film/video editor (Medical):   Marland Kitchen Lack of Transportation (Non-Medical):   Physical Activity:   . Days of Exercise per Week:   . Minutes of Exercise per Session:   Stress:   . Feeling of Stress :   Social Connections:   . Frequency of Communication with Friends and Family:   . Frequency of Social Gatherings with Friends and Family:   . Attends Religious Services:   . Active Member of Clubs or Organizations:   . Attends Theatre manager Meetings:   Marland Kitchen Marital Status:   Intimate Partner Violence:   . Fear of Current or Ex-Partner:   . Emotionally Abused:   Marland Kitchen Physically Abused:   . Sexually Abused:    Family History:  Family History  Problem Relation Age of Onset  . Cancer Mother        lung cancer  . Diabetes Other   . Hyperlipidemia Other   . Hypertension Other   . Cancer Other     Review of Systems: Constitutional: Doesn't report fevers, chills or abnormal weight loss Eyes: Doesn't report blurriness of vision Ears, nose, mouth, throat, and face: Doesn't report sore throat Respiratory: Doesn't report cough, dyspnea or wheezes Cardiovascular: Doesn't report palpitation, chest discomfort  Gastrointestinal:  Doesn't report nausea, constipation, diarrhea GU: Doesn't report incontinence Skin: Doesn't report skin rashes Neurological: Per HPI Musculoskeletal: Doesn't report joint pain Behavioral/Psych: Doesn't report anxiety  Physical Exam: Vitals:   01/26/20 0837  BP: (!) 148/92  Pulse: 85  Resp: 18  Temp: 98.1 F (36.7 C)  SpO2: 97%   KPS: 80. General: Alert, cooperative, pleasant, in no acute distress Head: Normal EENT: No conjunctival injection or scleral icterus.  Lungs: Resp effort normal Cardiac: Regular rate Abdomen: Non-distended abdomen Skin: No rashes cyanosis or petechiae. Extremities: No clubbing or edema  Neurologic Exam: Mental Status: Awake, alert, attentive to examiner. Oriented to self and environment. Language is fluent with intact comprehension.  Cranial Nerves: Visual acuity is grossly normal. Visual fields are full. Extra-ocular movements intact. No ptosis. Face is symmetric Motor: Tone and bulk are normal. Power is full in both arms and legs. Reflexes are symmetric, no pathologic reflexes present.  Sensory: Impaired stocking glove pattern Gait: Dystaxic   Labs: I have reviewed the data as listed    Component Value Date/Time   NA 142 01/20/2020 0823   NA 141  10/13/2019 1558   K 3.7 01/20/2020 0823   CL 107 01/20/2020 0823   CO2 27 01/20/2020 0823   GLUCOSE 95 01/20/2020 0823   BUN 11 01/20/2020 0823   BUN 26 10/13/2019 1558   CREATININE 1.23 (H) 01/20/2020 0823   CALCIUM 9.0 01/20/2020  0823   PROT 5.6 (L) 01/20/2020 0823   PROT 5.9 (L) 10/08/2019 1456   ALBUMIN 3.6 01/20/2020 0823   ALBUMIN 3.7 (L) 10/08/2019 1456   AST 12 (L) 01/20/2020 0823   ALT 8 01/20/2020 0823   ALKPHOS 56 01/20/2020 0823   BILITOT 0.4 01/20/2020 0823   GFRNONAA 45 (L) 01/20/2020 0823   GFRAA 53 (L) 01/20/2020 0823   Lab Results  Component Value Date   WBC 6.7 01/20/2020   NEUTROABS 5.4 01/20/2020   HGB 9.0 (L) 01/20/2020   HCT 28.8 (L) 01/20/2020   MCV 99.0 01/20/2020   PLT 152 01/20/2020    Imaging:  [place] Clinician Interpretation: I have personally reviewed the CNS images as listed.  My interpretation, in the context of the patient's clinical presentation, is stable disease  MR BRAIN W WO CONTRAST  Result Date: 01/25/2020 CLINICAL DATA:  Follow-up treated brain metastases. EXAM: MRI HEAD WITHOUT AND WITH CONTRAST TECHNIQUE: Multiplanar, multiecho pulse sequences of the brain and surrounding structures were obtained without and with intravenous contrast. CONTRAST:  63m GADAVIST GADOBUTROL 1 MMOL/ML IV SOLN COMPARISON:  10/02/2019 FINDINGS: Brain: No sign of acute infarction. Age related atrophy. Chronic small-vessel ischemic changes throughout the pons and cerebral hemispheric white matter. Right cerebellar metastasis reduced in size from 4.6 x 4.1 x 3.1 cm to a size of 2.1 x 1.8 x 1.4 cm today. No visible enhancing component. Previously seen solid metastasis in the right frontal region that measures 3.6 x 3.5 x 3.4 cm today measures 1.2 cm in diameter. Precontrast T1 hyperintensity without visible additional enhancement. Previously seen 2.4 x 1.9 x 2.3 cm left frontal metastasis is now represented by cyst measuring 12 x 8 mm without enhancement. Small  ependymal metastases along the left lateral ventricle as well as at the floor of the left lateral ventricle are no longer visible. No new or progressive lesion. Resolution of mass effect and vasogenic edema seen previously. Vascular: Major vessels at the base of the brain show flow. Skull and upper cervical spine: Negative Sinuses/Orbits: Sinuses are clear. Orbits negative. Bilateral mastoid effusions now present. Other: None IMPRESSION: Favorable response to therapy.  No new or progressive lesions. Previously seen 4 cm right cerebellar metastasis is now represented by a nonenhancing cyst measuring 2.1 x 1.8 x 1.4 cm. Previously seen 3.5 cm right frontal metastasis now measures 1.2 cm in diameter, showing precontrast T1 hyperintensity but no additional enhancement. Previously seen 2.3 cm left frontal metastasis is now represented by a nonenhancing cyst measuring 12 x 8 mm. No longer any visible ependymal metastases along the left lateral ventricle. Electronically Signed   By: MNelson ChimesM.D.   On: 01/25/2020 16:48    Assessment/Plan Metastatic small cell carcinoma to brain ([facility] [C79.31]  DSuszanne Connersis clinically and radiographically stable today.   We recommended she formally discontinue dexamethasone.  We ask that DGALYA DUNNIGANreturn to clinic in 3 months following next brain MRI, or sooner as needed.  We appreciate the opportunity to participate in the care of DPROMISS Hobbs    All questions were answered. The patient knows to call the clinic with any problems, questions or concerns. No barriers to learning were detected.  The total time spent in the encounter was 30 minutes and more than 50% was on counseling and review of test results   ZVentura Sellers MD Medical Director of Neuro-Oncology [facility] WL[facility] [place]07/12/21 8:38 AM

## 2020-01-27 ENCOUNTER — Telehealth: Payer: Self-pay | Admitting: Internal Medicine

## 2020-01-27 ENCOUNTER — Inpatient Hospital Stay: Payer: Medicare PPO

## 2020-01-27 ENCOUNTER — Other Ambulatory Visit: Payer: Self-pay

## 2020-01-27 DIAGNOSIS — C7951 Secondary malignant neoplasm of bone: Secondary | ICD-10-CM | POA: Diagnosis not present

## 2020-01-27 DIAGNOSIS — C3431 Malignant neoplasm of lower lobe, right bronchus or lung: Secondary | ICD-10-CM | POA: Diagnosis not present

## 2020-01-27 DIAGNOSIS — Z5111 Encounter for antineoplastic chemotherapy: Secondary | ICD-10-CM | POA: Diagnosis not present

## 2020-01-27 DIAGNOSIS — C7931 Secondary malignant neoplasm of brain: Secondary | ICD-10-CM | POA: Diagnosis not present

## 2020-01-27 DIAGNOSIS — R5383 Other fatigue: Secondary | ICD-10-CM | POA: Diagnosis not present

## 2020-01-27 DIAGNOSIS — I10 Essential (primary) hypertension: Secondary | ICD-10-CM | POA: Diagnosis not present

## 2020-01-27 DIAGNOSIS — Z95828 Presence of other vascular implants and grafts: Secondary | ICD-10-CM

## 2020-01-27 DIAGNOSIS — D649 Anemia, unspecified: Secondary | ICD-10-CM

## 2020-01-27 DIAGNOSIS — R0609 Other forms of dyspnea: Secondary | ICD-10-CM | POA: Diagnosis not present

## 2020-01-27 DIAGNOSIS — Z5189 Encounter for other specified aftercare: Secondary | ICD-10-CM | POA: Diagnosis not present

## 2020-01-27 DIAGNOSIS — Z5112 Encounter for antineoplastic immunotherapy: Secondary | ICD-10-CM | POA: Diagnosis not present

## 2020-01-27 LAB — CBC WITH DIFFERENTIAL (CANCER CENTER ONLY)
Abs Immature Granulocytes: 0 10*3/uL (ref 0.00–0.07)
Band Neutrophils: 4 %
Basophils Absolute: 0 10*3/uL (ref 0.0–0.1)
Basophils Relative: 0 %
Eosinophils Absolute: 0 10*3/uL (ref 0.0–0.5)
Eosinophils Relative: 0 %
HCT: 25.3 % — ABNORMAL LOW (ref 36.0–46.0)
Hemoglobin: 8.1 g/dL — ABNORMAL LOW (ref 12.0–15.0)
Lymphocytes Relative: 9 %
Lymphs Abs: 0.4 10*3/uL — ABNORMAL LOW (ref 0.7–4.0)
MCH: 31.4 pg (ref 26.0–34.0)
MCHC: 32 g/dL (ref 30.0–36.0)
MCV: 98.1 fL (ref 80.0–100.0)
Monocytes Absolute: 0 10*3/uL — ABNORMAL LOW (ref 0.1–1.0)
Monocytes Relative: 1 %
Neutro Abs: 4.4 10*3/uL (ref 1.7–7.7)
Neutrophils Relative %: 86 %
Platelet Count: 41 10*3/uL — ABNORMAL LOW (ref 150–400)
RBC: 2.58 MIL/uL — ABNORMAL LOW (ref 3.87–5.11)
RDW: 21.5 % — ABNORMAL HIGH (ref 11.5–15.5)
WBC Count: 4.9 10*3/uL (ref 4.0–10.5)
nRBC: 0 % (ref 0.0–0.2)

## 2020-01-27 LAB — PREPARE RBC (CROSSMATCH)

## 2020-01-27 MED ORDER — SODIUM CHLORIDE 0.9% FLUSH
10.0000 mL | INTRAVENOUS | Status: DC | PRN
  Filled 2020-01-27: qty 10

## 2020-01-27 MED ORDER — ACETAMINOPHEN 325 MG PO TABS
650.0000 mg | ORAL_TABLET | Freq: Once | ORAL | Status: AC
  Administered 2020-01-27: 650 mg via ORAL

## 2020-01-27 MED ORDER — SODIUM CHLORIDE 0.9% IV SOLUTION
250.0000 mL | Freq: Once | INTRAVENOUS | Status: AC
  Administered 2020-01-27: 250 mL via INTRAVENOUS
  Filled 2020-01-27: qty 250

## 2020-01-27 MED ORDER — DIPHENHYDRAMINE HCL 25 MG PO CAPS
ORAL_CAPSULE | ORAL | Status: AC
  Filled 2020-01-27: qty 1

## 2020-01-27 MED ORDER — ACETAMINOPHEN 325 MG PO TABS
ORAL_TABLET | ORAL | Status: AC
  Filled 2020-01-27: qty 2

## 2020-01-27 MED ORDER — HEPARIN SOD (PORK) LOCK FLUSH 100 UNIT/ML IV SOLN
500.0000 [IU] | Freq: Every day | INTRAVENOUS | Status: AC | PRN
  Administered 2020-01-27: 500 [IU]
  Filled 2020-01-27: qty 5

## 2020-01-27 MED ORDER — SODIUM CHLORIDE 0.9% FLUSH
10.0000 mL | INTRAVENOUS | Status: DC | PRN
  Administered 2020-01-27 (×2): 10 mL
  Filled 2020-01-27: qty 10

## 2020-01-27 MED ORDER — DIPHENHYDRAMINE HCL 25 MG PO CAPS
25.0000 mg | ORAL_CAPSULE | Freq: Once | ORAL | Status: AC
  Administered 2020-01-27: 25 mg via ORAL

## 2020-01-27 NOTE — Telephone Encounter (Signed)
Scheduled per 7/12 los. Unable to reach pt. Voicemail Is not set up. Sending pt appt calendar.

## 2020-01-27 NOTE — Patient Instructions (Signed)

## 2020-01-28 LAB — TYPE AND SCREEN
ABO/RH(D): A POS
Antibody Screen: NEGATIVE
Unit division: 0

## 2020-01-28 LAB — BPAM RBC
Blood Product Expiration Date: 202107282359
ISSUE DATE / TIME: 202107131030
Unit Type and Rh: 6200

## 2020-02-03 ENCOUNTER — Inpatient Hospital Stay: Payer: Medicare PPO

## 2020-02-03 ENCOUNTER — Other Ambulatory Visit: Payer: Self-pay | Admitting: Medical Oncology

## 2020-02-03 ENCOUNTER — Telehealth: Payer: Self-pay | Admitting: Medical Oncology

## 2020-02-03 ENCOUNTER — Other Ambulatory Visit: Payer: Self-pay

## 2020-02-03 DIAGNOSIS — Z5111 Encounter for antineoplastic chemotherapy: Secondary | ICD-10-CM | POA: Diagnosis not present

## 2020-02-03 DIAGNOSIS — R5383 Other fatigue: Secondary | ICD-10-CM | POA: Diagnosis not present

## 2020-02-03 DIAGNOSIS — C3431 Malignant neoplasm of lower lobe, right bronchus or lung: Secondary | ICD-10-CM

## 2020-02-03 DIAGNOSIS — D649 Anemia, unspecified: Secondary | ICD-10-CM

## 2020-02-03 DIAGNOSIS — Z5189 Encounter for other specified aftercare: Secondary | ICD-10-CM | POA: Diagnosis not present

## 2020-02-03 DIAGNOSIS — I10 Essential (primary) hypertension: Secondary | ICD-10-CM | POA: Diagnosis not present

## 2020-02-03 DIAGNOSIS — D696 Thrombocytopenia, unspecified: Secondary | ICD-10-CM

## 2020-02-03 DIAGNOSIS — C7951 Secondary malignant neoplasm of bone: Secondary | ICD-10-CM | POA: Diagnosis not present

## 2020-02-03 DIAGNOSIS — Z95828 Presence of other vascular implants and grafts: Secondary | ICD-10-CM

## 2020-02-03 DIAGNOSIS — Z5112 Encounter for antineoplastic immunotherapy: Secondary | ICD-10-CM | POA: Diagnosis not present

## 2020-02-03 DIAGNOSIS — C7931 Secondary malignant neoplasm of brain: Secondary | ICD-10-CM | POA: Diagnosis not present

## 2020-02-03 DIAGNOSIS — R0609 Other forms of dyspnea: Secondary | ICD-10-CM | POA: Diagnosis not present

## 2020-02-03 LAB — CBC WITH DIFFERENTIAL (CANCER CENTER ONLY)
Abs Immature Granulocytes: 0.39 10*3/uL — ABNORMAL HIGH (ref 0.00–0.07)
Basophils Absolute: 0 10*3/uL (ref 0.0–0.1)
Basophils Relative: 1 %
Eosinophils Absolute: 0 10*3/uL (ref 0.0–0.5)
Eosinophils Relative: 0 %
HCT: 23.2 % — ABNORMAL LOW (ref 36.0–46.0)
Hemoglobin: 7.6 g/dL — ABNORMAL LOW (ref 12.0–15.0)
Immature Granulocytes: 7 %
Lymphocytes Relative: 10 %
Lymphs Abs: 0.6 10*3/uL — ABNORMAL LOW (ref 0.7–4.0)
MCH: 32.1 pg (ref 26.0–34.0)
MCHC: 32.8 g/dL (ref 30.0–36.0)
MCV: 97.9 fL (ref 80.0–100.0)
Monocytes Absolute: 0.6 10*3/uL (ref 0.1–1.0)
Monocytes Relative: 10 %
Neutro Abs: 4.3 10*3/uL (ref 1.7–7.7)
Neutrophils Relative %: 72 %
Platelet Count: <5 10*3/uL — CL (ref 150–400)
RBC: 2.37 MIL/uL — ABNORMAL LOW (ref 3.87–5.11)
RDW: 18.7 % — ABNORMAL HIGH (ref 11.5–15.5)
WBC Count: 6.1 10*3/uL (ref 4.0–10.5)
nRBC: 0 % (ref 0.0–0.2)

## 2020-02-03 LAB — PREPARE RBC (CROSSMATCH)

## 2020-02-03 LAB — CMP (CANCER CENTER ONLY)
ALT: 9 U/L (ref 0–44)
AST: 12 U/L — ABNORMAL LOW (ref 15–41)
Albumin: 3.2 g/dL — ABNORMAL LOW (ref 3.5–5.0)
Alkaline Phosphatase: 87 U/L (ref 38–126)
Anion gap: 10 (ref 5–15)
BUN: 13 mg/dL (ref 8–23)
CO2: 24 mmol/L (ref 22–32)
Calcium: 9.2 mg/dL (ref 8.9–10.3)
Chloride: 109 mmol/L (ref 98–111)
Creatinine: 1.14 mg/dL — ABNORMAL HIGH (ref 0.44–1.00)
GFR, Est AFR Am: 58 mL/min — ABNORMAL LOW (ref >60)
GFR, Estimated: 50 mL/min — ABNORMAL LOW (ref >60)
Glucose, Bld: 80 mg/dL (ref 70–99)
Potassium: 3.6 mmol/L (ref 3.5–5.1)
Sodium: 143 mmol/L (ref 135–145)
Total Bilirubin: 0.4 mg/dL (ref 0.3–1.2)
Total Protein: 5.6 g/dL — ABNORMAL LOW (ref 6.5–8.1)

## 2020-02-03 MED ORDER — SODIUM CHLORIDE 0.9% IV SOLUTION
250.0000 mL | Freq: Once | INTRAVENOUS | Status: AC
  Administered 2020-02-03: 250 mL via INTRAVENOUS
  Filled 2020-02-03: qty 250

## 2020-02-03 MED ORDER — DIPHENHYDRAMINE HCL 25 MG PO CAPS
25.0000 mg | ORAL_CAPSULE | Freq: Once | ORAL | Status: AC
  Administered 2020-02-03: 25 mg via ORAL

## 2020-02-03 MED ORDER — ACETAMINOPHEN 325 MG PO TABS
650.0000 mg | ORAL_TABLET | Freq: Once | ORAL | Status: AC
  Administered 2020-02-03: 650 mg via ORAL

## 2020-02-03 MED ORDER — HEPARIN SOD (PORK) LOCK FLUSH 100 UNIT/ML IV SOLN
500.0000 [IU] | Freq: Every day | INTRAVENOUS | Status: AC | PRN
  Administered 2020-02-03: 500 [IU]
  Filled 2020-02-03: qty 5

## 2020-02-03 MED ORDER — DIPHENHYDRAMINE HCL 25 MG PO CAPS
ORAL_CAPSULE | ORAL | Status: AC
  Filled 2020-02-03: qty 1

## 2020-02-03 MED ORDER — SODIUM CHLORIDE 0.9% FLUSH
10.0000 mL | INTRAVENOUS | Status: DC | PRN
  Administered 2020-02-03: 10 mL
  Filled 2020-02-03: qty 10

## 2020-02-03 MED ORDER — SODIUM CHLORIDE 0.9% FLUSH
10.0000 mL | INTRAVENOUS | Status: DC | PRN
  Administered 2020-02-03: 10 mL via INTRAVENOUS
  Filled 2020-02-03: qty 10

## 2020-02-03 MED ORDER — ACETAMINOPHEN 325 MG PO TABS
ORAL_TABLET | ORAL | Status: AC
  Filled 2020-02-03: qty 2

## 2020-02-03 MED ORDER — HEPARIN SOD (PORK) LOCK FLUSH 100 UNIT/ML IV SOLN
500.0000 [IU] | Freq: Once | INTRAVENOUS | Status: AC | PRN
  Administered 2020-02-03: 500 [IU]
  Filled 2020-02-03: qty 5

## 2020-02-03 NOTE — Patient Instructions (Signed)
Platelet Transfusion A platelet transfusion is a procedure in which you receive donated platelets through an IV. Platelets are tiny pieces of blood cells. When you get an injury, platelets clump together in the area to form a blood clot. This helps stop bleeding and is the beginning of the healing process. If you have too few platelets, your blood may have trouble clotting. This may cause you to bleed and bruise very easily. You may need a platelet transfusion if you have a condition that causes a low number of platelets (thrombocytopenia). A platelet transfusion may be used to stop or prevent excessive bleeding. Tell a health care provider about:  Any reactions you have had during previous transfusions.  Any allergies you have.  All medicines you are taking, including vitamins, herbs, eye drops, creams, and over-the-counter medicines.  Any blood disorders you have.  Any surgeries you have had.  Any medical conditions you have.  Whether you are pregnant or may be pregnant. What are the risks? Generally, this is a safe procedure. However, problems may occur, including:  Fever.  Infection.  Allergic reaction to the donor platelets.  Your body's disease-fighting system (immune system) attacking the donor platelets (hemolytic reaction). This is rare.  A rare reaction that causes lung damage (transfusion-related acute lung injury). What happens before the procedure? Medicines  Ask your health care provider about: ? Changing or stopping your regular medicines. This is especially important if you are taking diabetes medicines or blood thinners. ? Taking medicines such as aspirin and ibuprofen. These medicines can thin your blood. Do not take these medicines unless your health care provider tells you to take them. ? Taking over-the-counter medicines, vitamins, herbs, and supplements. General instructions  You will have a blood test to determine your blood type. Your blood type  determines what kind of platelets you will be given.  Follow instructions from your health care provider about eating or drinking restrictions.  If you have had an allergic reaction to a transfusion in the past, you may be given medicine to help prevent a reaction.  Your temperature, blood pressure, pulse, and breathing will be monitored. What happens during the procedure?   An IV will be inserted into one of your veins.  For your safety, two health care providers will verify your identity along with the donor platelets about to be infused.  A bag of donor platelets will be connected to your IV. The platelets will flow into your bloodstream. This usually takes 30-60 minutes.  Your temperature, blood pressure, pulse, and breathing will be monitored during the transfusion. This helps detect early signs of any reaction.  You will also be monitored for other symptoms that may indicate a reaction, including chills, hives, or itching.  If you have signs of a reaction at any time, your transfusion will be stopped, and you may be given medicine to help manage the reaction.  When your transfusion is complete, your IV will be removed.  Pressure may be applied to the IV site for a few minutes to stop any bleeding.  The IV site will be covered with a bandage (dressing). The procedure may vary among health care providers and hospitals. What happens after the procedure?  Your blood pressure, temperature, pulse, and breathing will be monitored until you leave the hospital or clinic.  You may have some bruising and soreness at your IV site. Follow these instructions at home: Medicines  Take over-the-counter and prescription medicines only as told by your health care provider.  Talk with your health care provider before you take any medicines that contain aspirin or NSAIDs. These medicines increase your risk for dangerous bleeding. General instructions  Change or remove your dressing as told  by your health care provider.  Return to your normal activities as told by your health care provider. Ask your health care provider what activities are safe for you.  Do not take baths, swim, or use a hot tub until your health care provider approves. Ask your health care provider if you may take showers.  Check your IV site every day for signs of infection. Check for: ? Redness, swelling, or pain. ? Fluid or blood. If fluid or blood drains from your IV site, use your hands to press down firmly on a bandage covering the area for a minute or two. Doing this should stop the bleeding. ? Warmth. ? Pus or a bad smell.  Keep all follow-up visits as told by your health care provider. This is important. Contact a health care provider if you have:  A headache that does not go away with medicine.  Hives, rash, or itchy skin.  Nausea or vomiting.  Unusual tiredness or weakness.  Signs of infection at your IV site. Get help right away if:  You have a fever or chills.  You urinate less often than usual.  Your urine is darker colored than normal.  You have any of the following: ? Trouble breathing. ? Pain in your back, abdomen, or chest. ? Cool, clammy skin. ? A fast heartbeat. Summary  Platelets are tiny pieces of blood cells that clump together to form a blood clot when you have an injury. If you have too few platelets, your blood may have trouble clotting.  A platelet transfusion is a procedure in which you receive donated platelets through an IV.  A platelet transfusion may be used to stop or prevent excessive bleeding.  After the procedure, check your IV site every day for signs of infection, including redness, swelling, pain, or warmth. This information is not intended to replace advice given to you by your health care provider. Make sure you discuss any questions you have with your health care provider. Document Revised: 08/08/2017 Document Reviewed: 08/08/2017 Elsevier  Patient Education  2020 [place].   Blood Transfusion, Adult, Care After This sheet gives you information about how to care for yourself after your procedure. Your doctor may also give you more specific instructions. If you have problems or questions, contact your doctor. What can I expect after the procedure? After the procedure, it is common to have:  Bruising and soreness at the IV site.  A fever or chills on the day of the procedure. This may be your body's response to the new blood cells received.  A headache. Follow these instructions at home: Insertion site care      Follow instructions from your doctor about how to take care of your insertion site. This is where an IV tube was put into your vein. Make sure you: ? Wash your hands with soap and water before and after you change your bandage (dressing). If you cannot use soap and water, use hand sanitizer. ? Change your bandage as told by your doctor.  Check your insertion site every day for signs of infection. Check for: ? Redness, swelling, or pain. ? Bleeding from the site. ? Warmth. ? Pus or a bad smell. General instructions  Take over-the-counter and prescription medicines only as told by your doctor.  Rest  as told by your doctor.  Go back to your normal activities as told by your doctor.  Keep all follow-up visits as told by your doctor. This is important. Contact a doctor if:  You have itching or red, swollen areas of skin (hives).  You feel worried or nervous (anxious).  You feel weak after doing your normal activities.  You have redness, swelling, warmth, or pain around the insertion site.  You have blood coming from the insertion site, and the blood does not stop with pressure.  You have pus or a bad smell coming from the insertion site. Get help right away if:  You have signs of a serious reaction. This may be coming from an allergy or the body's defense system (immune system). Signs  include: ? Trouble breathing or shortness of breath. ? Swelling of the face or feeling warm (flushed). ? Fever or chills. ? Head, chest, or back pain. ? Dark pee (urine) or blood in the pee. ? Widespread rash. ? Fast heartbeat. ? Feeling dizzy or light-headed. You may receive your blood transfusion in an outpatient setting. If so, you will be told whom to contact to report any reactions. These symptoms may be an emergency. Do not wait to see if the symptoms will go away. Get medical help right away. Call your local emergency services (911 in the [place] Do not drive yourself to the hospital. Summary  Bruising and soreness at the IV site are common.  Check your insertion site every day for signs of infection.  Rest as told by your doctor. Go back to your normal activities as told by your doctor.  Get help right away if you have signs of a serious reaction. This information is not intended to replace advice given to you by your health care provider. Make sure you discuss any questions you have with your health care provider. Document Revised: 12/26/2018 Document Reviewed: 12/26/2018 Elsevier Patient Education  [place].

## 2020-02-03 NOTE — Telephone Encounter (Signed)
Derrick notified and he will get pt back here.

## 2020-02-03 NOTE — Telephone Encounter (Signed)
LVM to come back for plts and blood transfusion.

## 2020-02-03 NOTE — Patient Instructions (Signed)

## 2020-02-03 NOTE — Addendum Note (Signed)
Addended by: Domingo Madeira on: 02/03/2020 12:57 PM   Modules accepted: Orders, SmartSet

## 2020-02-04 LAB — BPAM PLATELET PHERESIS
Blood Product Expiration Date: 202107222359
ISSUE DATE / TIME: 202107201355
Unit Type and Rh: 6200

## 2020-02-04 LAB — TYPE AND SCREEN
ABO/RH(D): A POS
Antibody Screen: NEGATIVE
Unit division: 0

## 2020-02-04 LAB — PREPARE PLATELET PHERESIS: Unit division: 0

## 2020-02-04 LAB — BPAM RBC
Blood Product Expiration Date: 202108132359
ISSUE DATE / TIME: 202107201457
Unit Type and Rh: 6200

## 2020-02-04 NOTE — Progress Notes (Signed)
Pharmacist Chemotherapy Monitoring - Follow Up Assessment    I verify that I have reviewed each item in the below checklist:  . Regimen for the patient is scheduled for the appropriate day and plan matches scheduled date. Marland Kitchen Appropriate non-routine labs are ordered dependent on drug ordered. . If applicable, additional medications reviewed and ordered per protocol based on lifetime cumulative doses and/or treatment regimen.   Plan for follow-up and/or issues identified: No . I-vent associated with next due treatment: No . MD and/or nursing notified: No  [name] 02/04/2020 12:30 PM

## 2020-02-05 ENCOUNTER — Other Ambulatory Visit: Payer: Self-pay

## 2020-02-05 ENCOUNTER — Ambulatory Visit (HOSPITAL_COMMUNITY)
Admission: RE | Admit: 2020-02-05 | Discharge: 2020-02-05 | Disposition: A | Discharge status: Home / Self Care | Payer: Medicare PPO | Source: Ambulatory Visit | Attending: Physician Assistant | Admitting: Physician Assistant

## 2020-02-05 DIAGNOSIS — I700 Atherosclerosis of aorta: Secondary | ICD-10-CM | POA: Diagnosis not present

## 2020-02-05 DIAGNOSIS — C3431 Malignant neoplasm of lower lobe, right bronchus or lung: Secondary | ICD-10-CM | POA: Insufficient documentation

## 2020-02-05 DIAGNOSIS — Z5111 Encounter for antineoplastic chemotherapy: Secondary | ICD-10-CM | POA: Diagnosis not present

## 2020-02-05 DIAGNOSIS — I712 Thoracic aortic aneurysm, without rupture: Secondary | ICD-10-CM | POA: Diagnosis not present

## 2020-02-05 DIAGNOSIS — I714 Abdominal aortic aneurysm, without rupture: Secondary | ICD-10-CM | POA: Diagnosis not present

## 2020-02-05 DIAGNOSIS — J90 Pleural effusion, not elsewhere classified: Secondary | ICD-10-CM | POA: Diagnosis not present

## 2020-02-05 MED ORDER — HEPARIN SOD (PORK) LOCK FLUSH 100 UNIT/ML IV SOLN
500.0000 [IU] | Freq: Once | INTRAVENOUS | Status: AC
  Administered 2020-02-05: 500 [IU] via INTRAVENOUS

## 2020-02-05 MED ORDER — IOHEXOL 300 MG/ML  SOLN
100.0000 mL | Freq: Once | INTRAMUSCULAR | Status: AC | PRN
  Administered 2020-02-05: 100 mL via INTRAVENOUS

## 2020-02-05 MED ORDER — HEPARIN SOD (PORK) LOCK FLUSH 100 UNIT/ML IV SOLN
INTRAVENOUS | Status: AC
  Filled 2020-02-05: qty 5

## 2020-02-05 MED ORDER — SODIUM CHLORIDE (PF) 0.9 % IJ SOLN
INTRAMUSCULAR | Status: AC
  Filled 2020-02-05: qty 50

## 2020-02-09 ENCOUNTER — Other Ambulatory Visit: Payer: Medicare Other

## 2020-02-10 ENCOUNTER — Encounter: Payer: Self-pay | Admitting: Internal Medicine

## 2020-02-10 ENCOUNTER — Inpatient Hospital Stay: Payer: Medicare PPO

## 2020-02-10 ENCOUNTER — Inpatient Hospital Stay (HOSPITAL_BASED_OUTPATIENT_CLINIC_OR_DEPARTMENT_OTHER): Payer: Medicare PPO | Admitting: Internal Medicine

## 2020-02-10 ENCOUNTER — Other Ambulatory Visit: Payer: Self-pay

## 2020-02-10 VITALS — BP 152/89 | HR 92 | Temp 97.5°F | Resp 16 | Wt 176.6 lb

## 2020-02-10 DIAGNOSIS — Z5112 Encounter for antineoplastic immunotherapy: Secondary | ICD-10-CM | POA: Diagnosis not present

## 2020-02-10 DIAGNOSIS — C7931 Secondary malignant neoplasm of brain: Secondary | ICD-10-CM | POA: Diagnosis not present

## 2020-02-10 DIAGNOSIS — D649 Anemia, unspecified: Secondary | ICD-10-CM

## 2020-02-10 DIAGNOSIS — R5383 Other fatigue: Secondary | ICD-10-CM | POA: Diagnosis not present

## 2020-02-10 DIAGNOSIS — R0609 Other forms of dyspnea: Secondary | ICD-10-CM | POA: Diagnosis not present

## 2020-02-10 DIAGNOSIS — C3431 Malignant neoplasm of lower lobe, right bronchus or lung: Secondary | ICD-10-CM

## 2020-02-10 DIAGNOSIS — Z5111 Encounter for antineoplastic chemotherapy: Secondary | ICD-10-CM | POA: Diagnosis not present

## 2020-02-10 DIAGNOSIS — Z5189 Encounter for other specified aftercare: Secondary | ICD-10-CM | POA: Diagnosis not present

## 2020-02-10 DIAGNOSIS — I10 Essential (primary) hypertension: Secondary | ICD-10-CM

## 2020-02-10 DIAGNOSIS — C7951 Secondary malignant neoplasm of bone: Secondary | ICD-10-CM | POA: Diagnosis not present

## 2020-02-10 DIAGNOSIS — Z95828 Presence of other vascular implants and grafts: Secondary | ICD-10-CM

## 2020-02-10 LAB — CBC WITH DIFFERENTIAL (CANCER CENTER ONLY)
Abs Immature Granulocytes: 0.06 10*3/uL (ref 0.00–0.07)
Basophils Absolute: 0 10*3/uL (ref 0.0–0.1)
Basophils Relative: 0 %
Eosinophils Absolute: 0 10*3/uL (ref 0.0–0.5)
Eosinophils Relative: 0 %
HCT: 25.2 % — ABNORMAL LOW (ref 36.0–46.0)
Hemoglobin: 8.4 g/dL — ABNORMAL LOW (ref 12.0–15.0)
Immature Granulocytes: 1 %
Lymphocytes Relative: 8 %
Lymphs Abs: 0.5 10*3/uL — ABNORMAL LOW (ref 0.7–4.0)
MCH: 31.8 pg (ref 26.0–34.0)
MCHC: 33.3 g/dL (ref 30.0–36.0)
MCV: 95.5 fL (ref 80.0–100.0)
Monocytes Absolute: 0.6 10*3/uL (ref 0.1–1.0)
Monocytes Relative: 11 %
Neutro Abs: 4.5 10*3/uL (ref 1.7–7.7)
Neutrophils Relative %: 80 %
Platelet Count: 40 10*3/uL — ABNORMAL LOW (ref 150–400)
RBC: 2.64 MIL/uL — ABNORMAL LOW (ref 3.87–5.11)
RDW: 18.1 % — ABNORMAL HIGH (ref 11.5–15.5)
WBC Count: 5.7 10*3/uL (ref 4.0–10.5)
nRBC: 0 % (ref 0.0–0.2)

## 2020-02-10 LAB — CMP (CANCER CENTER ONLY)
ALT: 7 U/L (ref 0–44)
AST: 10 U/L — ABNORMAL LOW (ref 15–41)
Albumin: 3.1 g/dL — ABNORMAL LOW (ref 3.5–5.0)
Alkaline Phosphatase: 91 U/L (ref 38–126)
Anion gap: 9 (ref 5–15)
BUN: 12 mg/dL (ref 8–23)
CO2: 26 mmol/L (ref 22–32)
Calcium: 9.3 mg/dL (ref 8.9–10.3)
Chloride: 107 mmol/L (ref 98–111)
Creatinine: 1.34 mg/dL — ABNORMAL HIGH (ref 0.44–1.00)
GFR, Est AFR Am: 47 mL/min — ABNORMAL LOW (ref >60)
GFR, Estimated: 41 mL/min — ABNORMAL LOW (ref >60)
Glucose, Bld: 98 mg/dL (ref 70–99)
Potassium: 3.4 mmol/L — ABNORMAL LOW (ref 3.5–5.1)
Sodium: 142 mmol/L (ref 135–145)
Total Bilirubin: 0.4 mg/dL (ref 0.3–1.2)
Total Protein: 5.6 g/dL — ABNORMAL LOW (ref 6.5–8.1)

## 2020-02-10 MED ORDER — SODIUM CHLORIDE 0.9% FLUSH
10.0000 mL | INTRAVENOUS | Status: DC | PRN
  Administered 2020-02-10: 10 mL
  Filled 2020-02-10: qty 10

## 2020-02-10 MED ORDER — HEPARIN SOD (PORK) LOCK FLUSH 100 UNIT/ML IV SOLN
500.0000 [IU] | Freq: Once | INTRAVENOUS | Status: AC
  Administered 2020-02-10: 500 [IU] via INTRAVENOUS
  Filled 2020-02-10: qty 5

## 2020-02-10 MED ORDER — SODIUM CHLORIDE 0.9% FLUSH
10.0000 mL | INTRAVENOUS | Status: DC | PRN
  Administered 2020-02-10: 10 mL via INTRAVENOUS
  Filled 2020-02-10: qty 10

## 2020-02-10 NOTE — Progress Notes (Signed)
[facility] [phone]   [phone]) [phone]  OFFICE PROGRESS NOTE  Rutherford Guys, MD [address] [name] [address]  DIAGNOSIS: Metastatic small cell lung cancer initially diagnosed as Limited stage (T2b, N2, M0) small cell lung cancer, pending further staging work-up diagnosed and January 2020.  She presented with right lower lobe lung mass in addition to right hilar and mediastinal lymphadenopathy.  She has evidence of metastatic disease to the brain in March 2021.  PRIOR THERAPY:  1) Systemic chemotherapy concurrent with radiation.  Chemotherapy withcisplatin 80 mg/M2 on day 1 and etoposide 100 mg/M2 on days 1, 2 and 3 every 3 weeks.First dose September 03, 2018. Status post5cycles.Dose reducedstarting fromcycle #4to cisplatin 40 mg/m2 and etoposide 80 mg/m2 due to renal insufficiency. 2) whole brain irradiation under the care of Dr. Sondra Come.  CURRENT THERAPY: Systemic chemotherapy with carboplatin for AUC of 5 on day 1, etoposide 100 mg/M2 on days 1, 2 and 3 with Neulasta support in addition to Imfinzi 1500 mg IV every 3 weeks during chemotherapy.   Status post 4 cycles.  Starting from cycle #5 she will be treated with maintenance Imfinzi every 4 weeks.   INTERVAL HISTORY: [name] 69 y.o. female returns to the clinic today for follow-up visit accompanied by her husband.  The patient is feeling fine today with no concerning complaints.  She denied having any current chest pain, shortness of breath, cough or hemoptysis.  She has mild fatigue.  She denied having any headache or visual changes.  She has mild bruises and she is currently on anticoagulation.  The patient has no recent weight loss or night sweats.  She has no nausea, vomiting, diarrhea or constipation.  She had repeat CT scan of the chest, abdomen pelvis as well as MRI of the brain performed recently and she is here for evaluation and discussion of her scan results and  treatment options.  MEDICAL HISTORY: Past Medical History:  Diagnosis Date  . Arthritis   . Chronic pain   . Hyperlipidemia   . Hypertension   . Low blood magnesium 10/04/2018  . lung ca dx'd 06/2018  . Stroke ([place])    06/19/2018 minor    ALLERGIES:  has No Known Allergies.  MEDICATIONS:  Current Outpatient Medications  Medication Sig Dispense Refill  . acetaminophen (TYLENOL) 650 MG CR tablet Take 650 mg by mouth every 8 (eight) hours as needed for pain.    Marland Kitchen amLODipine (NORVASC) 10 MG tablet Take 1 tablet (10 mg total) by mouth daily as needed. (Patient not taking: Reported on 12/22/2019)    . atorvastatin (LIPITOR) 80 MG tablet TAKE 1 TABLET (80 MG TOTAL) BY MOUTH DAILY AT 6 PM. 90 tablet 3  . Clobetasol Prop Emollient Base (CLOBETASOL PROPIONATE E) 0.05 % emollient cream Apply 1 application topically 2 (two) times daily. 60 g 3  . DULoxetine (CYMBALTA) 30 MG capsule TAKE 1 CAPSULE BY MOUTH EVERY DAY 90 capsule 1  . potassium chloride SA (KLOR-CON) 20 MEQ tablet Take 1 tablet (20 mEq total) by mouth daily. (Patient not taking: Reported on 01/12/2020) 7 tablet 0  . pregabalin (LYRICA) 50 MG capsule TAKE 1 CAPSULE 3 TIMES A DAY 90 capsule 2  . prochlorperazine (COMPAZINE) 10 MG tablet Take 1 tablet (10 mg total) by mouth every 6 (six) hours as needed for nausea or vomiting. 30 tablet 0  . traMADol (ULTRAM) 50 MG tablet Take 1-2 tablets (50-100 mg total) by mouth  at bedtime as needed. 30 tablet 1  . XARELTO 20 MG TABS tablet TAKE 1 TABLET BY MOUTH EVERY DAY WITH SUPPER 90 tablet 0   No current facility-administered medications for this visit.   Facility-Administered Medications Ordered in Other Visits  Medication Dose Route Frequency Provider Last Rate Last Admin  . cloNIDine (CATAPRES) tablet 0.2 mg  0.2 mg Oral Once Curt Bears, MD      . sodium chloride flush (NS) 0.9 % injection 10 mL  10 mL Intracatheter PRN Curt Bears, MD   10 mL at 02/10/20 3086    SURGICAL  HISTORY:  Past Surgical History:  Procedure Laterality Date  . BIOPSY  06/19/2018   Procedure: BIOPSY;  Surgeon: Laurence Spates, MD;  Location: [facility];  Service: Endoscopy;;  . BIOPSY  10/11/2018   Procedure: BIOPSY;  Surgeon: Laurence Spates, MD;  Location: [facility];  Service: Endoscopy;;  . ENDARTERECTOMY Right 06/26/2018   Procedure: ENDARTERECTOMY CAROTID RIGHT;  Surgeon: Serafina Mitchell, MD;  Location: [facility] OR;  Service: Vascular;  Laterality: Right;  . ESOPHAGOGASTRODUODENOSCOPY (EGD) WITH PROPOFOL N/A 06/19/2018   Procedure: ESOPHAGOGASTRODUODENOSCOPY (EGD) WITH PROPOFOL;  Surgeon: Laurence Spates, MD;  Location: [facility];  Service: Endoscopy;  Laterality: N/A;  . FLEXIBLE SIGMOIDOSCOPY N/A 10/11/2018   Procedure: FLEXIBLE SIGMOIDOSCOPY;  Surgeon: Laurence Spates, MD;  Location: [facility];  Service: Endoscopy;  Laterality: N/A;  . IR IMAGING GUIDED PORT INSERTION  10/07/2018  . PATCH ANGIOPLASTY Right 06/26/2018   Procedure: PATCH ANGIOPLASTY USING A XENOSURE 1cm x 6cm BIOLOGIC PATCH;  Surgeon: Serafina Mitchell, MD;  Location: [facility];  Service: Vascular;  Laterality: Right;  Marland Kitchen VIDEO BRONCHOSCOPY WITH ENDOBRONCHIAL ULTRASOUND N/A 08/14/2018   Procedure: VIDEO BRONCHOSCOPY WITH ENDOBRONCHIAL ULTRASOUND;  Surgeon: Collene Gobble, MD;  Location: [facility] OR;  Service: Thoracic;  Laterality: N/A;    REVIEW OF SYSTEMS:  Constitutional: positive for fatigue Eyes: negative Ears, nose, mouth, throat, and face: negative Respiratory: positive for dyspnea on exertion Cardiovascular: negative Gastrointestinal: negative Genitourinary:negative Integument/breast: negative Hematologic/lymphatic: negative Musculoskeletal:negative Neurological: negative Behavioral/Psych: negative Endocrine: negative Allergic/Immunologic: negative   PHYSICAL EXAMINATION: General appearance: alert, cooperative, fatigued and no distress Head: Normocephalic, without obvious abnormality, atraumatic Neck: no  adenopathy, no JVD, supple, symmetrical, trachea midline and thyroid not enlarged, symmetric, no tenderness/mass/nodules Lymph nodes: Cervical, supraclavicular, and axillary nodes normal. Resp: clear to auscultation bilaterally Back: symmetric, no curvature. ROM normal. No CVA tenderness. Cardio: regular rate and rhythm, S1, S2 normal, no murmur, click, rub or gallop GI: soft, non-tender; bowel sounds normal; no masses,  no organomegaly Extremities: extremities normal, atraumatic, no cyanosis or edema Neurologic: Alert and oriented X 3, normal strength and tone. Normal symmetric reflexes. Normal coordination and gait  ECOG PERFORMANCE STATUS: 1 - Symptomatic but completely ambulatory  Blood pressure (!) 152/89, pulse 92, temperature (!) 97.5 F (36.4 C), temperature source Temporal, resp. rate 16, weight 176 lb 9.6 oz (80.1 kg), SpO2 99 %.  LABORATORY DATA: Lab Results  Component Value Date   WBC 5.7 02/10/2020   HGB 8.4 (L) 02/10/2020   HCT 25.2 (L) 02/10/2020   MCV 95.5 02/10/2020   PLT 40 (L) 02/10/2020      Chemistry      Component Value Date/Time   NA 143 02/03/2020 1128   NA 141 10/13/2019 1558   K 3.6 02/03/2020 1128   CL 109 02/03/2020 1128   CO2 24 02/03/2020 1128   BUN 13 02/03/2020 1128   BUN 26 10/13/2019 1558   CREATININE  1.14 (H) 02/03/2020 1128      Component Value Date/Time   CALCIUM 9.2 02/03/2020 1128   ALKPHOS 87 02/03/2020 1128   AST 12 (L) 02/03/2020 1128   ALT 9 02/03/2020 1128   BILITOT 0.4 02/03/2020 1128       RADIOGRAPHIC STUDIES: CT Chest W Contrast  Result Date: 02/05/2020 CLINICAL DATA:  Primary Cancer Type: Lung Imaging Indication: Routine surveillance Interval therapy since last imaging? Yes Initial Cancer Diagnosis Date: 08/14/2018; established by: Biopsy-proven Detailed Pathology: Limited stage small cell carcinoma. Primary Tumor location: Right lower lobe.  Metastases to brain. Surgeries: No Chemotherapy: Yes; Ongoing? Yes; Most  recent administration: 01/20/2020 Immunotherapy?  Yes; Type: Imfinzi, Neulasta; Ongoing? Yes Radiation therapy? Yes Date Range: 10/08/2019-10/21/2019; Target: Whole brain Date Range: 09/25/2018-11/06/2018; Target: Right lung EXAM: CT CHEST, ABDOMEN, AND PELVIS WITH CONTRAST TECHNIQUE: Multidetector CT imaging of the chest, abdomen and pelvis was performed following the standard protocol during bolus administration of intravenous contrast. CONTRAST:  143mL OMNIPAQUE IOHEXOL 300 MG/ML  SOLN COMPARISON:  Most recent CT chest, abdomen and pelvis 12/18/2019. 08/30/2018 PET-CT. FINDINGS: CT CHEST FINDINGS Cardiovascular: Normal heart size. There is a small pericardial effusion which is minimally increased in volume from previous exam. Aortic atherosclerosis. Aneurysmal dilatation of the aortic arch is identified which measures 3.6 cm, image 137/6. Left main, lad, left circumflex and RCA coronary artery calcifications identified. Mediastinum/Nodes: Normal appearance of the thyroid gland. The trachea appears patent and is midline. Normal appearance of the esophagus. Right paratracheal lymph node measures 3.2 cm, image 17/2. Previously 3.3 cm. Subcarinal lymph node measures 1.2 cm, image 24/2. Previously this measured 1.3 cm. No supraclavicular or axillary adenopathy. Lungs/Pleura: Small right pleural effusion is mildly increased from previous exam. Paramediastinal radiation change is identified within the right upper lobe and right lower lobe. New patchy areas of ground-glass attenuation are identified within the right lower lobe which are compatible with nonspecific pneumonitis and favored to represent progressive changes of external beam radiation. Masslike architectural distortion within the right mid lung is again identified with surrounding fibrosis and volume loss. The underlying treated tumor measures approximately 2.4 cm, image 30/2. Unchanged from the previous exam. Small scattered lung nodules are identified  within the right upper lobe and appears similar to the previous exam. These have a nonspecific appearance measuring less than 5 mm. No new suspicious lung nodules. Musculoskeletal: Compression fracture the T6 vertebra and appears unchanged from previous exam. There is asymmetric increased sclerosis within the right posterior elements of the T6 vertebral body, suspicious for sclerotic bone metastases. This appears similar to the previous exam, image 20/2. CT ABDOMEN PELVIS FINDINGS Hepatobiliary: No focal liver abnormality is seen. No gallstones, gallbladder wall thickening, or biliary dilatation. Pancreas: Unremarkable. No pancreatic ductal dilatation or surrounding inflammatory changes. Spleen: Normal in size without focal abnormality. Adrenals/Urinary Tract: Normal right adrenal gland. Two nodules are identified within the left adrenal gland. The largest measures 1.1 cm and appears unchanged from previous study, image 57/2. Mild bilateral renal cortical volume loss. No mass or hydronephrosis identified. The urinary bladder appears within normal limits. Stomach/Bowel: Stomach is within normal limits. Appendix appears normal. No evidence of bowel wall thickening, distention, or inflammatory changes. Vascular/Lymphatic: Aortic atherosclerosis. Mild infrarenal abdominal aortic aneurysm measures 3 cm, image 67/2. No adenopathy identified within the abdomen or pelvis. No inguinal adenopathy. Reproductive: Uterus and bilateral adnexa are unremarkable. Other: No ascites or focal fluid collections identified. No signs of peritoneal disease. Musculoskeletal: Cystic degenerative changes are noted involving the left  hip with asymmetric sclerosis and subchondral cyst formation involving the roof of the left acetabulum. Anterior endplate sclerosis involving T11, T12 and L1 is favored to reflect discogenic sclerosis secondary to degenerative disc disease. Bilateral lumbar spine facet arthropathy. IMPRESSION: 1. Stable appearance  of treated tumor within the right mid lung with surrounding radiation change. 2. Persistent right paratracheal adenopathy concerning for residual disease. 3. Similar appearance of asymmetric sclerosis involving the right posterior elements of T6. This has remained unchanged since 06/23/2018. 4. Stable appearance of T6 compression fracture. 5. Aortic atherosclerosis and 3 cm infrarenal abdominal aortic aneurysm. Aortic aneurysm NOS (ICD10-I71.9). 6. Left main and 3 vessel coronary artery calcifications. 7. 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 Attention on future surveillance imaging is advised. 8. Small pericardial effusion, minimally increased in volume from previous exam. 9. Small right pleural effusion, mildly increased from previous exam. 10. Aortic atherosclerosis. Aortic Atherosclerosis (ICD10-I70.0). Electronically Signed   By: Kerby Moors M.D.   On: 02/05/2020 11:38   MR BRAIN W WO CONTRAST  Result Date: 01/25/2020 CLINICAL DATA:  Follow-up treated brain metastases. EXAM: MRI HEAD WITHOUT AND WITH CONTRAST TECHNIQUE: Multiplanar, multiecho pulse sequences of the brain and surrounding structures were obtained without and with intravenous contrast. CONTRAST:  36mL GADAVIST GADOBUTROL 1 MMOL/ML IV SOLN COMPARISON:  10/02/2019 FINDINGS: Brain: No sign of acute infarction. Age related atrophy. Chronic small-vessel ischemic changes throughout the pons and cerebral hemispheric white matter. Right cerebellar metastasis reduced in size from 4.6 x 4.1 x 3.1 cm to a size of 2.1 x 1.8 x 1.4 cm today. No visible enhancing component. Previously seen solid metastasis in the right frontal region that measures 3.6 x 3.5 x 3.4 cm today measures 1.2 cm in diameter. Precontrast T1 hyperintensity without visible additional enhancement. Previously seen 2.4 x 1.9 x 2.3 cm left frontal metastasis is now represented by cyst measuring 12 x 8 mm without enhancement. Small ependymal metastases along the left  lateral ventricle as well as at the floor of the left lateral ventricle are no longer visible. No new or progressive lesion. Resolution of mass effect and vasogenic edema seen previously. Vascular: Major vessels at the base of the brain show flow. Skull and upper cervical spine: Negative Sinuses/Orbits: Sinuses are clear. Orbits negative. Bilateral mastoid effusions now present. Other: None IMPRESSION: Favorable response to therapy.  No new or progressive lesions. Previously seen 4 cm right cerebellar metastasis is now represented by a nonenhancing cyst measuring 2.1 x 1.8 x 1.4 cm. Previously seen 3.5 cm right frontal metastasis now measures 1.2 cm in diameter, showing precontrast T1 hyperintensity but no additional enhancement. Previously seen 2.3 cm left frontal metastasis is now represented by a nonenhancing cyst measuring 12 x 8 mm. No longer any visible ependymal metastases along the left lateral ventricle. Electronically Signed   By: Nelson Chimes M.D.   On: 01/25/2020 16:48   CT Abdomen Pelvis W Contrast  Result Date: 02/09/2020 CLINICAL DATA:  Primary Cancer Type: Lung Imaging Indication: Routine surveillance Interval therapy since last imaging? Yes Initial Cancer Diagnosis Date: 08/14/2018; established by: Biopsy-proven Detailed Pathology: Limited stage small cell carcinoma. Primary Tumor location: Right lower lobe.  Metastases to brain. Surgeries: No Chemotherapy: Yes; Ongoing? Yes; Most recent administration: 01/20/2020 Immunotherapy?  Yes; Type: Imfinzi, Neulasta; Ongoing? Yes Radiation therapy? Yes Date Range: 10/08/2019-10/21/2019; Target: Whole brain Date Range: 09/25/2018-11/06/2018; Target: Right lung EXAM: CT CHEST, ABDOMEN, AND PELVIS WITH CONTRAST TECHNIQUE: Multidetector CT imaging of the chest, abdomen and pelvis was performed following  the standard protocol during bolus administration of intravenous contrast. CONTRAST:  135mL OMNIPAQUE IOHEXOL 300 MG/ML  SOLN COMPARISON:  Most recent CT  chest, abdomen and pelvis 12/18/2019. 08/30/2018 PET-CT. FINDINGS: CT CHEST FINDINGS Cardiovascular: Normal heart size. There is a small pericardial effusion which is minimally increased in volume from previous exam. Aortic atherosclerosis. Aneurysmal dilatation of the aortic arch is identified which measures 3.6 cm, image 137/6. Left main, lad, left circumflex and RCA coronary artery calcifications identified. Mediastinum/Nodes: Normal appearance of the thyroid gland. The trachea appears patent and is midline. Normal appearance of the esophagus. Right paratracheal lymph node measures 3.2 cm, image 17/2. Previously 3.3 cm. Subcarinal lymph node measures 1.2 cm, image 24/2. Previously this measured 1.3 cm. No supraclavicular or axillary adenopathy. Lungs/Pleura: Small right pleural effusion is mildly increased from previous exam. Paramediastinal radiation change is identified within the right upper lobe and right lower lobe. New patchy areas of ground-glass attenuation are identified within the right lower lobe which are compatible with nonspecific pneumonitis and favored to represent progressive changes of external beam radiation. Masslike architectural distortion within the right mid lung is again identified with surrounding fibrosis and volume loss. The underlying treated tumor measures approximately 2.4 cm, image 30/2. Unchanged from the previous exam. Small scattered lung nodules are identified within the right upper lobe and appears similar to the previous exam. These have a nonspecific appearance measuring less than 5 mm. No new suspicious lung nodules. Musculoskeletal: Compression fracture the T6 vertebra and appears unchanged from previous exam. There is asymmetric increased sclerosis within the right posterior elements of the T6 vertebral body, suspicious for sclerotic bone metastases. This appears similar to the previous exam, image 20/2. CT ABDOMEN PELVIS FINDINGS Hepatobiliary: No focal liver abnormality  is seen. No gallstones, gallbladder wall thickening, or biliary dilatation. Pancreas: Unremarkable. No pancreatic ductal dilatation or surrounding inflammatory changes. Spleen: Normal in size without focal abnormality. Adrenals/Urinary Tract: Normal right adrenal gland. Two nodules are identified within the left adrenal gland. The largest measures 1.1 cm and appears unchanged from previous study, image 57/2. Mild bilateral renal cortical volume loss. No mass or hydronephrosis identified. The urinary bladder appears within normal limits. Stomach/Bowel: Stomach is within normal limits. Appendix appears normal. No evidence of bowel wall thickening, distention, or inflammatory changes. Vascular/Lymphatic: Aortic atherosclerosis. Mild infrarenal abdominal aortic aneurysm measures 3 cm, image 67/2. No adenopathy identified within the abdomen or pelvis. No inguinal adenopathy. Reproductive: Uterus and bilateral adnexa are unremarkable. Other: No ascites or focal fluid collections identified. No signs of peritoneal disease. Musculoskeletal: Cystic degenerative changes are noted involving the left hip with asymmetric sclerosis and subchondral cyst formation involving the roof of the left acetabulum. Anterior endplate sclerosis involving T11, T12 and L1 is favored to reflect discogenic sclerosis secondary to degenerative disc disease. Bilateral lumbar spine facet arthropathy. IMPRESSION: 1. Stable appearance of treated tumor within the right mid lung with surrounding radiation change. 2. Persistent right paratracheal adenopathy concerning for residual disease. 3. Similar appearance of asymmetric sclerosis involving the right posterior elements of T6. This has remained unchanged since 06/23/2018. 4. Stable appearance of T6 compression fracture. 5. Aortic atherosclerosis and 3 cm infrarenal abdominal aortic aneurysm. Aortic aneurysm NOS (ICD10-I71.9). 6. Left main and 3 vessel coronary artery calcifications. 7. Aneurysmal  dilatation of the transverse aortic arch. Attention on future surveillance imaging is advised. 8. Small pericardial effusion, minimally increased in volume from previous exam. 9. Small right pleural effusion, mildly increased from previous exam. 10. Aortic atherosclerosis. Aortic Atherosclerosis (  ICD10-I70.0). Electronically Signed   By: Kerby Moors M.D.   On: 02/05/2020 11:38    ASSESSMENT AND PLAN: This is a very pleasant 69 years old white female with relapsed small cell lung cancer initially diagnosed as limited stage small cell lung cancer diagnosed in January 2020 status post systemic chemotherapy initially with cisplatin and etoposide status post 5 cycles.  Her dose of cisplatin and etoposide was significantly reduced starting from cycle #4 secondary to worsening renal insufficiency.  Her treatment was concurrent with radiotherapy. The patient had evidence for metastatic disease to the brain and bone in March 2021. She underwent whole brain irradiation under the care of Dr. Sondra Come. Her scan showed no clear evidence for progression in the lung but there was new sclerotic bone metastasis. The patient started systemic chemotherapy with carboplatin for AUC of 5 on day 1, etoposide 100 mg/M2 on days 1, 2, 3 with Neulasta support in addition to Imfinzi 1500 mg IV every 3 weeks, status post 4 cycles.   The patient has a rough time with the previous treatment with significant pancytopenia but she was able to complete the 4 cycles of the combination of chemotherapy and immunotherapy. She had repeat CT scan of the chest, abdomen pelvis as well as MRI of the brain performed recently.  I personally and independently reviewed the scans and discussed the results with the patient and her husband today. Her scan showed no concerning findings for disease progression. I recommended for the patient to proceed with the maintenance phase of her treatment with single agent Imfinzi every 4 weeks. Her platelets count  are still low today at 40,000.  I will reschedule her treatment to start in 1 week or until the platelets count recovers. She will come back for follow-up visit next week for reevaluation before resuming her maintenance therapy. Regarding the history of pulmonary embolism, she will continue her current treatment with Xarelto. The patient was advised to call immediately if she has any concerning symptoms in the interval. The patient voices understanding of current disease status and treatment options and is in agreement with the current care plan.  All questions were answered. The patient knows to call the clinic with any problems, questions or concerns. We can certainly see the patient much sooner if necessary.   Disclaimer: This note was dictated with voice recognition software. Similar sounding words can inadvertently be transcribed and may not be corrected upon review.

## 2020-02-11 ENCOUNTER — Telehealth: Payer: Self-pay | Admitting: Internal Medicine

## 2020-02-11 ENCOUNTER — Ambulatory Visit: Payer: Medicare PPO

## 2020-02-11 NOTE — Telephone Encounter (Signed)
Scheduled per los. Called and spoke with patient and Dominica Severin, confirmed appts

## 2020-02-12 ENCOUNTER — Ambulatory Visit: Payer: Medicare PPO

## 2020-02-13 NOTE — Progress Notes (Signed)
[facility] OFFICE PROGRESS NOTE  Rutherford Guys, MD [address] [name] [address]  DIAGNOSIS: Metastatic small cell lung cancer initially diagnosed asLimited stage (T2b, N2,M0) small cell lung cancer, pending further staging work-up diagnosed and January 2020. She presented with right lower lobe lung mass in addition to right hilar and mediastinal lymphadenopathy.She has evidence of metastatic disease to the brain in March 2021.  PRIOR THERAPY:  1)Systemic chemotherapy concurrent with radiation. Chemotherapy withcisplatin 80 mg/M2 on day 1 and etoposide 100 mg/M2 on days 1, 2 and 3 every 3 weeks.Last dose5/20/20.Status post5cycles.Dose reducedstarting fromcycle #4to cisplatin 40 mg/m2 and etoposide 80 mg/m2 due to renal insufficiency. 2)whole brain irradiation under the care of Dr. Sondra Come.Last treatment 10/21/19  CURRENT THERAPY: Systemic chemotherapy with carboplatin for AUC of 5 on day 1, etoposide 100 mg/M2 on days 1, 2 and 3 with Neulasta support in addition to Imfinzi 1500 mg IV every 3 weeks during chemotherapy. First dose on 11/03/19. Status post 4 cycles.Her dose was reduced to carboplatin for an AUC of 4 and etoposide 80 mg/m2 starting from cycle #2 due to pancytopenia. She will start maintenance single agent imfinzi starting from cycle #5.   INTERVAL HISTORY: [name] 69 y.o. female returns to the clinic today for a follow-up visit accompanied by her significant other, [name].  The patient is feeling well today without any concerning complaints. She did have an episode of nausea and vomiting this AM but feels well now. She also was using 10 lb weights and has some soreness in her pectoralis muscles on the left chest. The patient is scheduled to start maintenance treatment with single agent immunotherapy with Imfinzi today.  She was supposed to have treatment last week but it was held secondary to thrombocytopenia.  She denies any recent fever,  chills, or night sweats. Her appetite comes and goes. Her weight is stable compared to last week. She denies any chest pain, shortness of breath, cough, or hemoptysis.  She denies any diarrhea. She sometimes experiences constipation.  She denies any headache or visual changes.  She follows closely with Dr. Mickeal Skinner, neuro oncologist, for her history of metastatic disease to the brain.  She denies any rashes or skin changes.  She is here today for evaluation before starting cycle #5.  MEDICAL HISTORY: Past Medical History:  Diagnosis Date  . Arthritis   . Chronic pain   . Hyperlipidemia   . Hypertension   . Low blood magnesium 10/04/2018  . lung ca dx'd 06/2018  . Stroke ([place])    06/19/2018 minor    ALLERGIES:  has No Known Allergies.  MEDICATIONS:  Current Outpatient Medications  Medication Sig Dispense Refill  . acetaminophen (TYLENOL) 650 MG CR tablet Take 650 mg by mouth every 8 (eight) hours as needed for pain.    Marland Kitchen amLODipine (NORVASC) 10 MG tablet Take 1 tablet (10 mg total) by mouth daily as needed.    Marland Kitchen atorvastatin (LIPITOR) 80 MG tablet TAKE 1 TABLET (80 MG TOTAL) BY MOUTH DAILY AT 6 PM. 90 tablet 3  . Clobetasol Prop Emollient Base (CLOBETASOL PROPIONATE E) 0.05 % emollient cream Apply 1 application topically 2 (two) times daily. 60 g 3  . DULoxetine (CYMBALTA) 30 MG capsule TAKE 1 CAPSULE BY MOUTH EVERY DAY 90 capsule 1  . potassium chloride SA (KLOR-CON) 20 MEQ tablet Take 1 tablet (20 mEq total) by mouth daily. 7 tablet 0  . pregabalin (LYRICA) 50 MG capsule TAKE 1 CAPSULE 3 TIMES  A DAY 90 capsule 2  . prochlorperazine (COMPAZINE) 10 MG tablet Take 1 tablet (10 mg total) by mouth every 6 (six) hours as needed for nausea or vomiting. 30 tablet 2  . traMADol (ULTRAM) 50 MG tablet Take 1-2 tablets (50-100 mg total) by mouth at bedtime as needed. 30 tablet 1  . XARELTO 20 MG TABS tablet TAKE 1 TABLET BY MOUTH EVERY DAY WITH SUPPER 90 tablet 0   No current facility-administered  medications for this visit.   Facility-Administered Medications Ordered in Other Visits  Medication Dose Route Frequency Provider Last Rate Last Admin  . cloNIDine (CATAPRES) tablet 0.2 mg  0.2 mg Oral Once Curt Bears, MD        SURGICAL HISTORY:  Past Surgical History:  Procedure Laterality Date  . BIOPSY  06/19/2018   Procedure: BIOPSY;  Surgeon: Laurence Spates, MD;  Location: [facility];  Service: Endoscopy;;  . BIOPSY  10/11/2018   Procedure: BIOPSY;  Surgeon: Laurence Spates, MD;  Location: [facility];  Service: Endoscopy;;  . ENDARTERECTOMY Right 06/26/2018   Procedure: ENDARTERECTOMY CAROTID RIGHT;  Surgeon: Serafina Mitchell, MD;  Location: [facility] OR;  Service: Vascular;  Laterality: Right;  . ESOPHAGOGASTRODUODENOSCOPY (EGD) WITH PROPOFOL N/A 06/19/2018   Procedure: ESOPHAGOGASTRODUODENOSCOPY (EGD) WITH PROPOFOL;  Surgeon: Laurence Spates, MD;  Location: [facility];  Service: Endoscopy;  Laterality: N/A;  . FLEXIBLE SIGMOIDOSCOPY N/A 10/11/2018   Procedure: FLEXIBLE SIGMOIDOSCOPY;  Surgeon: Laurence Spates, MD;  Location: [facility];  Service: Endoscopy;  Laterality: N/A;  . IR IMAGING GUIDED PORT INSERTION  10/07/2018  . PATCH ANGIOPLASTY Right 06/26/2018   Procedure: PATCH ANGIOPLASTY USING A XENOSURE 1cm x 6cm BIOLOGIC PATCH;  Surgeon: Serafina Mitchell, MD;  Location: [facility];  Service: Vascular;  Laterality: Right;  Marland Kitchen VIDEO BRONCHOSCOPY WITH ENDOBRONCHIAL ULTRASOUND N/A 08/14/2018   Procedure: VIDEO BRONCHOSCOPY WITH ENDOBRONCHIAL ULTRASOUND;  Surgeon: Collene Gobble, MD;  Location: [facility] OR;  Service: Thoracic;  Laterality: N/A;    REVIEW OF SYSTEMS:   Review of Systems  Constitutional: Negative for appetite change, chills, fatigue, fever and unexpected weight change.  HENT: Negative for mouth sores, nosebleeds, sore throat and trouble swallowing.   Eyes: Negative for eye problems and icterus.  Respiratory: Negative for cough, hemoptysis, shortness of breath and wheezing.    Cardiovascular: Positive for left chest soreness. Negative for leg swelling.  Gastrointestinal: Positive for intermittent constipation. Negative for abdominal pain,  diarrhea, nausea and vomiting.  Genitourinary: Negative for bladder incontinence, difficulty urinating, dysuria, frequency and hematuria.   Musculoskeletal: Positive for chronic back pain. Negative gait problem, neck pain and neck stiffness.  Skin: Negative for itching and rash.  Neurological: Negative for dizziness, extremity weakness, gait problem, headaches, light-headedness and seizures.  Hematological: Negative for adenopathy. Does not bruise/bleed easily.  Psychiatric/Behavioral: Negative for confusion, depression and sleep disturbance. The patient is not nervous/anxious.     PHYSICAL EXAMINATION:  Blood pressure 133/90, pulse 96, temperature 97.9 F (36.6 C), temperature source Temporal, resp. rate 16, weight 176 lb 12.8 oz (80.2 kg), SpO2 99 %.  ECOG PERFORMANCE STATUS: 1 - Symptomatic but completely ambulatory  Physical Exam  Constitutional: Oriented to person, place, and time and well-developed, well-nourished, and in no distress. No distress.  HENT:  Head: Normocephalic and atraumatic.  Mouth/Throat: Oropharynx is clear and moist. No oropharyngeal exudate.  Eyes: Conjunctivae are normal. Right eye exhibits no discharge. Left eye exhibits no discharge. No scleral icterus.  Neck: Normal range of motion. Neck supple.  Cardiovascular: Normal  rate, regular rhythm, normal heart sounds and intact distal pulses.   Pulmonary/Chest: Effort normal and breath sounds normal. No respiratory distress. No wheezes. No rales.  Abdominal: Soft. Bowel sounds are normal. Exhibits no distension and no mass. There is no tenderness.  Musculoskeletal: Mild tenderness to palpitation over left anterior chest wall. Normal range of motion. Exhibits no edema.  Lymphadenopathy:    No cervical adenopathy.  Neurological: Alert and oriented to  person, place, and time. Exhibits normal muscle tone. Examined in the wheelchair. Skin: Skin is warm and dry. No rash noted. Not diaphoretic. No erythema. No pallor.  Psychiatric: Mood, memory and judgment normal.  Vitals reviewed.  LABORATORY DATA: Lab Results  Component Value Date   WBC 5.0 02/17/2020   HGB 8.4 (L) 02/17/2020   HCT 25.9 (L) 02/17/2020   MCV 97.0 02/17/2020   PLT 132 (L) 02/17/2020      Chemistry      Component Value Date/Time   NA 141 02/17/2020 1021   NA 141 10/13/2019 1558   K 3.4 (L) 02/17/2020 1021   CL 108 02/17/2020 1021   CO2 26 02/17/2020 1021   BUN 13 02/17/2020 1021   BUN 26 10/13/2019 1558   CREATININE 1.52 (H) 02/17/2020 1021      Component Value Date/Time   CALCIUM 9.4 02/17/2020 1021   ALKPHOS 85 02/17/2020 1021   AST 12 (L) 02/17/2020 1021   ALT <6 02/17/2020 1021   BILITOT 0.4 02/17/2020 1021       RADIOGRAPHIC STUDIES:  CT Chest W Contrast  Result Date: 02/05/2020 CLINICAL DATA:  Primary Cancer Type: Lung Imaging Indication: Routine surveillance Interval therapy since last imaging? Yes Initial Cancer Diagnosis Date: 08/14/2018; established by: Biopsy-proven Detailed Pathology: Limited stage small cell carcinoma. Primary Tumor location: Right lower lobe.  Metastases to brain. Surgeries: No Chemotherapy: Yes; Ongoing? Yes; Most recent administration: 01/20/2020 Immunotherapy?  Yes; Type: Imfinzi, Neulasta; Ongoing? Yes Radiation therapy? Yes Date Range: 10/08/2019-10/21/2019; Target: Whole brain Date Range: 09/25/2018-11/06/2018; Target: Right lung EXAM: CT CHEST, ABDOMEN, AND PELVIS WITH CONTRAST TECHNIQUE: Multidetector CT imaging of the chest, abdomen and pelvis was performed following the standard protocol during bolus administration of intravenous contrast. CONTRAST:  181mL OMNIPAQUE IOHEXOL 300 MG/ML  SOLN COMPARISON:  Most recent CT chest, abdomen and pelvis 12/18/2019. 08/30/2018 PET-CT. FINDINGS: CT CHEST FINDINGS Cardiovascular:  Normal heart size. There is a small pericardial effusion which is minimally increased in volume from previous exam. Aortic atherosclerosis. Aneurysmal dilatation of the aortic arch is identified which measures 3.6 cm, image 137/6. Left main, lad, left circumflex and RCA coronary artery calcifications identified. Mediastinum/Nodes: Normal appearance of the thyroid gland. The trachea appears patent and is midline. Normal appearance of the esophagus. Right paratracheal lymph node measures 3.2 cm, image 17/2. Previously 3.3 cm. Subcarinal lymph node measures 1.2 cm, image 24/2. Previously this measured 1.3 cm. No supraclavicular or axillary adenopathy. Lungs/Pleura: Small right pleural effusion is mildly increased from previous exam. Paramediastinal radiation change is identified within the right upper lobe and right lower lobe. New patchy areas of ground-glass attenuation are identified within the right lower lobe which are compatible with nonspecific pneumonitis and favored to represent progressive changes of external beam radiation. Masslike architectural distortion within the right mid lung is again identified with surrounding fibrosis and volume loss. The underlying treated tumor measures approximately 2.4 cm, image 30/2. Unchanged from the previous exam. Small scattered lung nodules are identified within the right upper lobe and appears similar to the previous exam.  These have a nonspecific appearance measuring less than 5 mm. No new suspicious lung nodules. Musculoskeletal: Compression fracture the T6 vertebra and appears unchanged from previous exam. There is asymmetric increased sclerosis within the right posterior elements of the T6 vertebral body, suspicious for sclerotic bone metastases. This appears similar to the previous exam, image 20/2. CT ABDOMEN PELVIS FINDINGS Hepatobiliary: No focal liver abnormality is seen. No gallstones, gallbladder wall thickening, or biliary dilatation. Pancreas: Unremarkable.  No pancreatic ductal dilatation or surrounding inflammatory changes. Spleen: Normal in size without focal abnormality. Adrenals/Urinary Tract: Normal right adrenal gland. Two nodules are identified within the left adrenal gland. The largest measures 1.1 cm and appears unchanged from previous study, image 57/2. Mild bilateral renal cortical volume loss. No mass or hydronephrosis identified. The urinary bladder appears within normal limits. Stomach/Bowel: Stomach is within normal limits. Appendix appears normal. No evidence of bowel wall thickening, distention, or inflammatory changes. Vascular/Lymphatic: Aortic atherosclerosis. Mild infrarenal abdominal aortic aneurysm measures 3 cm, image 67/2. No adenopathy identified within the abdomen or pelvis. No inguinal adenopathy. Reproductive: Uterus and bilateral adnexa are unremarkable. Other: No ascites or focal fluid collections identified. No signs of peritoneal disease. Musculoskeletal: Cystic degenerative changes are noted involving the left hip with asymmetric sclerosis and subchondral cyst formation involving the roof of the left acetabulum. Anterior endplate sclerosis involving T11, T12 and L1 is favored to reflect discogenic sclerosis secondary to degenerative disc disease. Bilateral lumbar spine facet arthropathy. IMPRESSION: 1. Stable appearance of treated tumor within the right mid lung with surrounding radiation change. 2. Persistent right paratracheal adenopathy concerning for residual disease. 3. Similar appearance of asymmetric sclerosis involving the right posterior elements of T6. This has remained unchanged since 06/23/2018. 4. Stable appearance of T6 compression fracture. 5. Aortic atherosclerosis and 3 cm infrarenal abdominal aortic aneurysm. Aortic aneurysm NOS (ICD10-I71.9). 6. Left main and 3 vessel coronary artery calcifications. 7. 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 Attention on future surveillance imaging is advised. 8. Small  pericardial effusion, minimally increased in volume from previous exam. 9. Small right pleural effusion, mildly increased from previous exam. 10. Aortic atherosclerosis. Aortic Atherosclerosis (ICD10-I70.0). Electronically Signed   By: Kerby Moors M.D.   On: 02/05/2020 11:38   MR BRAIN W WO CONTRAST  Result Date: 01/25/2020 CLINICAL DATA:  Follow-up treated brain metastases. EXAM: MRI HEAD WITHOUT AND WITH CONTRAST TECHNIQUE: Multiplanar, multiecho pulse sequences of the brain and surrounding structures were obtained without and with intravenous contrast. CONTRAST:  73mL GADAVIST GADOBUTROL 1 MMOL/ML IV SOLN COMPARISON:  10/02/2019 FINDINGS: Brain: No sign of acute infarction. Age related atrophy. Chronic small-vessel ischemic changes throughout the pons and cerebral hemispheric white matter. Right cerebellar metastasis reduced in size from 4.6 x 4.1 x 3.1 cm to a size of 2.1 x 1.8 x 1.4 cm today. No visible enhancing component. Previously seen solid metastasis in the right frontal region that measures 3.6 x 3.5 x 3.4 cm today measures 1.2 cm in diameter. Precontrast T1 hyperintensity without visible additional enhancement. Previously seen 2.4 x 1.9 x 2.3 cm left frontal metastasis is now represented by cyst measuring 12 x 8 mm without enhancement. Small ependymal metastases along the left lateral ventricle as well as at the floor of the left lateral ventricle are no longer visible. No new or progressive lesion. Resolution of mass effect and vasogenic edema seen previously. Vascular: Major vessels at the base of the brain show flow. Skull and upper cervical spine: Negative Sinuses/Orbits: Sinuses are clear. Orbits negative. Bilateral  mastoid effusions now present. Other: None IMPRESSION: Favorable response to therapy.  No new or progressive lesions. Previously seen 4 cm right cerebellar metastasis is now represented by a nonenhancing cyst measuring 2.1 x 1.8 x 1.4 cm. Previously seen 3.5 cm right frontal  metastasis now measures 1.2 cm in diameter, showing precontrast T1 hyperintensity but no additional enhancement. Previously seen 2.3 cm left frontal metastasis is now represented by a nonenhancing cyst measuring 12 x 8 mm. No longer any visible ependymal metastases along the left lateral ventricle. Electronically Signed   By: Nelson Chimes M.D.   On: 01/25/2020 16:48   CT Abdomen Pelvis W Contrast  Result Date: 02/09/2020 CLINICAL DATA:  Primary Cancer Type: Lung Imaging Indication: Routine surveillance Interval therapy since last imaging? Yes Initial Cancer Diagnosis Date: 08/14/2018; established by: Biopsy-proven Detailed Pathology: Limited stage small cell carcinoma. Primary Tumor location: Right lower lobe.  Metastases to brain. Surgeries: No Chemotherapy: Yes; Ongoing? Yes; Most recent administration: 01/20/2020 Immunotherapy?  Yes; Type: Imfinzi, Neulasta; Ongoing? Yes Radiation therapy? Yes Date Range: 10/08/2019-10/21/2019; Target: Whole brain Date Range: 09/25/2018-11/06/2018; Target: Right lung EXAM: CT CHEST, ABDOMEN, AND PELVIS WITH CONTRAST TECHNIQUE: Multidetector CT imaging of the chest, abdomen and pelvis was performed following the standard protocol during bolus administration of intravenous contrast. CONTRAST:  136mL OMNIPAQUE IOHEXOL 300 MG/ML  SOLN COMPARISON:  Most recent CT chest, abdomen and pelvis 12/18/2019. 08/30/2018 PET-CT. FINDINGS: CT CHEST FINDINGS Cardiovascular: Normal heart size. There is a small pericardial effusion which is minimally increased in volume from previous exam. Aortic atherosclerosis. Aneurysmal dilatation of the aortic arch is identified which measures 3.6 cm, image 137/6. Left main, lad, left circumflex and RCA coronary artery calcifications identified. Mediastinum/Nodes: Normal appearance of the thyroid gland. The trachea appears patent and is midline. Normal appearance of the esophagus. Right paratracheal lymph node measures 3.2 cm, image 17/2. Previously 3.3  cm. Subcarinal lymph node measures 1.2 cm, image 24/2. Previously this measured 1.3 cm. No supraclavicular or axillary adenopathy. Lungs/Pleura: Small right pleural effusion is mildly increased from previous exam. Paramediastinal radiation change is identified within the right upper lobe and right lower lobe. New patchy areas of ground-glass attenuation are identified within the right lower lobe which are compatible with nonspecific pneumonitis and favored to represent progressive changes of external beam radiation. Masslike architectural distortion within the right mid lung is again identified with surrounding fibrosis and volume loss. The underlying treated tumor measures approximately 2.4 cm, image 30/2. Unchanged from the previous exam. Small scattered lung nodules are identified within the right upper lobe and appears similar to the previous exam. These have a nonspecific appearance measuring less than 5 mm. No new suspicious lung nodules. Musculoskeletal: Compression fracture the T6 vertebra and appears unchanged from previous exam. There is asymmetric increased sclerosis within the right posterior elements of the T6 vertebral body, suspicious for sclerotic bone metastases. This appears similar to the previous exam, image 20/2. CT ABDOMEN PELVIS FINDINGS Hepatobiliary: No focal liver abnormality is seen. No gallstones, gallbladder wall thickening, or biliary dilatation. Pancreas: Unremarkable. No pancreatic ductal dilatation or surrounding inflammatory changes. Spleen: Normal in size without focal abnormality. Adrenals/Urinary Tract: Normal right adrenal gland. Two nodules are identified within the left adrenal gland. The largest measures 1.1 cm and appears unchanged from previous study, image 57/2. Mild bilateral renal cortical volume loss. No mass or hydronephrosis identified. The urinary bladder appears within normal limits. Stomach/Bowel: Stomach is within normal limits. Appendix appears normal. No  evidence of bowel wall thickening, distention,  or inflammatory changes. Vascular/Lymphatic: Aortic atherosclerosis. Mild infrarenal abdominal aortic aneurysm measures 3 cm, image 67/2. No adenopathy identified within the abdomen or pelvis. No inguinal adenopathy. Reproductive: Uterus and bilateral adnexa are unremarkable. Other: No ascites or focal fluid collections identified. No signs of peritoneal disease. Musculoskeletal: Cystic degenerative changes are noted involving the left hip with asymmetric sclerosis and subchondral cyst formation involving the roof of the left acetabulum. Anterior endplate sclerosis involving T11, T12 and L1 is favored to reflect discogenic sclerosis secondary to degenerative disc disease. Bilateral lumbar spine facet arthropathy. IMPRESSION: 1. Stable appearance of treated tumor within the right mid lung with surrounding radiation change. 2. Persistent right paratracheal adenopathy concerning for residual disease. 3. Similar appearance of asymmetric sclerosis involving the right posterior elements of T6. This has remained unchanged since 06/23/2018. 4. Stable appearance of T6 compression fracture. 5. Aortic atherosclerosis and 3 cm infrarenal abdominal aortic aneurysm. Aortic aneurysm NOS (ICD10-I71.9). 6. Left main and 3 vessel coronary artery calcifications. 7. 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 Attention on future surveillance imaging is advised. 8. Small pericardial effusion, minimally increased in volume from previous exam. 9. Small right pleural effusion, mildly increased from previous exam. 10. Aortic atherosclerosis. Aortic Atherosclerosis (ICD10-I70.0). Electronically Signed   By: Kerby Moors M.D.   On: 02/05/2020 11:38     ASSESSMENT/PLAN:  This is a very pleasant 69year old Caucasian female who was initially diagnosed with limited small cell lung cancer in January 2020. She initially presented with a right lower lobe lung mass status post 5 cycles  of systemic chemotherapy with Cisplatin and Etoposide. Her dose was reduced starting from cycle #4 due to renal insuffiencey. She also had concurrent radiation.   She was found to have extensive stage disease in March 2021 with the development of metastatic disease to the brain. This was treated with whole brain irradiation under the care of Dr. Sondra Come which was completed in April 2021.   She is currently undergoing treatment withpalliative systemic chemotherapy with carboplatin for AUC of 5 on day 1, etoposide 100 mg/M2 on days 1, 2 and 3 with Neulasta support in addition to Imfinzi 1500 mg IV every 3 weeks. Her dose was reduced to carboplatin for an AUC of 4 and etoposide 80 mg/m2 starting from cycle 2 due to pancytopenia.  She is status post 4 cycles andexperiences pancytopenia requiring platelet and blood transfusions. Starting from cycle #5, the patient is scheduled to begin maintenance single agent immunotherapy with Imfinzi.  The patient was seen with Dr. Julien Nordmann today.  Labs were reviewed.  Recommend that she proceed with cycle #5 today as scheduled.  We will see her back for follow-up visit in 4 weeks for evaluation before starting cycle #6.  Regarding her chest soreness, this is likely secondary to lifting weights. She was tenderness to palpation of the musculature consistent with muscle strain. She was advised to use tylenol and a heating pad.   She mentioned that she sometimes has a hard time swallowing pills and she often needs to take them with apple sauce. I also encouraged her to drink them with a whole glass of water.   I sent a refill of compazine to her pharmacy.   Her creatinine is a little bit elevated today. Encouraged the patient to increase her hydration.  The patient was advised to call immediately if she has any concerning symptoms in the interval. The patient voices understanding of current disease status and treatment options and is in agreement with the  current  care plan. All questions were answered. The patient knows to call the clinic with any problems, questions or concerns. We can certainly see the patient much sooner if necessary  No orders of the defined types were placed in this encounter.    Ciaran Begay L Rayme Bui, PA-C 02/17/20  ADDENDUM: Hematology/Oncology Attending: I had a face-to-face encounter with the patient today.  I recommended her care plan.  This is a very pleasant 69 years old white female with recurrent small cell lung cancer.  She received treatment with systemic chemotherapy with carboplatin for AUC of 5 on day 1, etoposide 100 mg/M2 on days 1, 2 and 3 with Neulasta support in addition to Imfinzi 1500 mg IV every 3 weeks status post 4 cycles. She tolerated her treatment well except for the chemotherapy-induced pancytopenia. Her last scan showed no concerning findings for disease progression. I recommended for the patient to proceed with the first cycle of maintenance treatment with Imfinzi 1500 mg IV every 4 weeks starting today. She will come back for follow-up visit in 4 weeks for evaluation before the next cycle of her treatment. She was advised to call immediately if she has any concerning symptoms in the interval.  Disclaimer: This note was dictated with voice recognition software. Similar sounding words can inadvertently be transcribed and may be missed upon review. Eilleen Kempf, MD 02/17/20

## 2020-02-14 ENCOUNTER — Ambulatory Visit: Payer: Medicare PPO

## 2020-02-16 ENCOUNTER — Other Ambulatory Visit: Payer: Self-pay | Admitting: Physician Assistant

## 2020-02-16 DIAGNOSIS — C3431 Malignant neoplasm of lower lobe, right bronchus or lung: Secondary | ICD-10-CM

## 2020-02-17 ENCOUNTER — Inpatient Hospital Stay: Payer: Medicare PPO

## 2020-02-17 ENCOUNTER — Encounter: Payer: Self-pay | Admitting: Physician Assistant

## 2020-02-17 ENCOUNTER — Other Ambulatory Visit: Payer: Self-pay

## 2020-02-17 ENCOUNTER — Inpatient Hospital Stay: Payer: Medicare PPO | Attending: Internal Medicine | Admitting: Physician Assistant

## 2020-02-17 VITALS — BP 133/90 | HR 96 | Temp 97.9°F | Resp 16 | Wt 176.8 lb

## 2020-02-17 DIAGNOSIS — C7931 Secondary malignant neoplasm of brain: Secondary | ICD-10-CM | POA: Insufficient documentation

## 2020-02-17 DIAGNOSIS — Z79899 Other long term (current) drug therapy: Secondary | ICD-10-CM | POA: Insufficient documentation

## 2020-02-17 DIAGNOSIS — N289 Disorder of kidney and ureter, unspecified: Secondary | ICD-10-CM | POA: Diagnosis not present

## 2020-02-17 DIAGNOSIS — C3431 Malignant neoplasm of lower lobe, right bronchus or lung: Secondary | ICD-10-CM

## 2020-02-17 DIAGNOSIS — E785 Hyperlipidemia, unspecified: Secondary | ICD-10-CM | POA: Diagnosis not present

## 2020-02-17 DIAGNOSIS — Z7901 Long term (current) use of anticoagulants: Secondary | ICD-10-CM | POA: Insufficient documentation

## 2020-02-17 DIAGNOSIS — R112 Nausea with vomiting, unspecified: Secondary | ICD-10-CM | POA: Diagnosis not present

## 2020-02-17 DIAGNOSIS — Z8673 Personal history of transient ischemic attack (TIA), and cerebral infarction without residual deficits: Secondary | ICD-10-CM | POA: Insufficient documentation

## 2020-02-17 DIAGNOSIS — I10 Essential (primary) hypertension: Secondary | ICD-10-CM | POA: Insufficient documentation

## 2020-02-17 DIAGNOSIS — M1990 Unspecified osteoarthritis, unspecified site: Secondary | ICD-10-CM | POA: Insufficient documentation

## 2020-02-17 DIAGNOSIS — Z5112 Encounter for antineoplastic immunotherapy: Secondary | ICD-10-CM | POA: Diagnosis not present

## 2020-02-17 DIAGNOSIS — Z923 Personal history of irradiation: Secondary | ICD-10-CM | POA: Insufficient documentation

## 2020-02-17 DIAGNOSIS — D61818 Other pancytopenia: Secondary | ICD-10-CM | POA: Diagnosis not present

## 2020-02-17 DIAGNOSIS — Z9221 Personal history of antineoplastic chemotherapy: Secondary | ICD-10-CM | POA: Diagnosis not present

## 2020-02-17 DIAGNOSIS — R5383 Other fatigue: Secondary | ICD-10-CM | POA: Insufficient documentation

## 2020-02-17 DIAGNOSIS — Z95828 Presence of other vascular implants and grafts: Secondary | ICD-10-CM

## 2020-02-17 DIAGNOSIS — K5900 Constipation, unspecified: Secondary | ICD-10-CM | POA: Diagnosis not present

## 2020-02-17 LAB — CBC WITH DIFFERENTIAL (CANCER CENTER ONLY)
Abs Immature Granulocytes: 0.02 10*3/uL (ref 0.00–0.07)
Basophils Absolute: 0 10*3/uL (ref 0.0–0.1)
Basophils Relative: 0 %
Eosinophils Absolute: 0 10*3/uL (ref 0.0–0.5)
Eosinophils Relative: 0 %
HCT: 25.9 % — ABNORMAL LOW (ref 36.0–46.0)
Hemoglobin: 8.4 g/dL — ABNORMAL LOW (ref 12.0–15.0)
Immature Granulocytes: 0 %
Lymphocytes Relative: 8 %
Lymphs Abs: 0.4 10*3/uL — ABNORMAL LOW (ref 0.7–4.0)
MCH: 31.5 pg (ref 26.0–34.0)
MCHC: 32.4 g/dL (ref 30.0–36.0)
MCV: 97 fL (ref 80.0–100.0)
Monocytes Absolute: 0.6 10*3/uL (ref 0.1–1.0)
Monocytes Relative: 11 %
Neutro Abs: 4 10*3/uL (ref 1.7–7.7)
Neutrophils Relative %: 81 %
Platelet Count: 132 10*3/uL — ABNORMAL LOW (ref 150–400)
RBC: 2.67 MIL/uL — ABNORMAL LOW (ref 3.87–5.11)
RDW: 21.1 % — ABNORMAL HIGH (ref 11.5–15.5)
WBC Count: 5 10*3/uL (ref 4.0–10.5)
nRBC: 0 % (ref 0.0–0.2)

## 2020-02-17 LAB — CMP (CANCER CENTER ONLY)
ALT: <6 U/L (ref 0–44)
AST: 12 U/L — ABNORMAL LOW (ref 15–41)
Albumin: 3.1 g/dL — ABNORMAL LOW (ref 3.5–5.0)
Alkaline Phosphatase: 85 U/L (ref 38–126)
Anion gap: 7 (ref 5–15)
BUN: 13 mg/dL (ref 8–23)
CO2: 26 mmol/L (ref 22–32)
Calcium: 9.4 mg/dL (ref 8.9–10.3)
Chloride: 108 mmol/L (ref 98–111)
Creatinine: 1.52 mg/dL — ABNORMAL HIGH (ref 0.44–1.00)
GFR, Est AFR Am: 41 mL/min — ABNORMAL LOW (ref >60)
GFR, Estimated: 35 mL/min — ABNORMAL LOW (ref >60)
Glucose, Bld: 98 mg/dL (ref 70–99)
Potassium: 3.4 mmol/L — ABNORMAL LOW (ref 3.5–5.1)
Sodium: 141 mmol/L (ref 135–145)
Total Bilirubin: 0.4 mg/dL (ref 0.3–1.2)
Total Protein: 5.7 g/dL — ABNORMAL LOW (ref 6.5–8.1)

## 2020-02-17 LAB — TSH: TSH: 3.125 u[IU]/mL (ref 0.308–3.960)

## 2020-02-17 MED ORDER — PROCHLORPERAZINE MALEATE 10 MG PO TABS: 10.0000 mg | ORAL_TABLET | Freq: Four times a day (QID) | ORAL | 2 refills | Status: AC | PRN

## 2020-02-17 MED ORDER — HEPARIN SOD (PORK) LOCK FLUSH 100 UNIT/ML IV SOLN
500.0000 [IU] | Freq: Once | INTRAVENOUS | Status: AC | PRN
  Administered 2020-02-17: 500 [IU]
  Filled 2020-02-17: qty 5

## 2020-02-17 MED ORDER — SODIUM CHLORIDE 0.9 % IV SOLN
Freq: Once | INTRAVENOUS | Status: AC
  Filled 2020-02-17: qty 250

## 2020-02-17 MED ORDER — SODIUM CHLORIDE 0.9% FLUSH
10.0000 mL | INTRAVENOUS | Status: DC | PRN
  Administered 2020-02-17: 10 mL
  Filled 2020-02-17: qty 10

## 2020-02-17 MED ORDER — SODIUM CHLORIDE 0.9 % IV SOLN
1500.0000 mg | Freq: Once | INTRAVENOUS | Status: AC
  Administered 2020-02-17: 1500 mg via INTRAVENOUS
  Filled 2020-02-17: qty 30

## 2020-02-17 NOTE — Progress Notes (Signed)
Per Cassandra Heillingoetter, PA-C, okay to treat with creatinine of 1.52.

## 2020-02-17 NOTE — Patient Instructions (Signed)
[facility] Discharge Instructions for Patients Receiving Chemotherapy  Today you received the following chemotherapy agents: Imfinzi  To help prevent nausea and vomiting after your treatment, we encourage you to take your nausea medication as prescribed.   If you develop nausea and vomiting that is not controlled by your nausea medication, call the clinic.   BELOW ARE SYMPTOMS THAT SHOULD BE REPORTED IMMEDIATELY:  *FEVER GREATER THAN 100.5 F  *CHILLS WITH OR WITHOUT FEVER  NAUSEA AND VOMITING THAT IS NOT CONTROLLED WITH YOUR NAUSEA MEDICATION  *UNUSUAL SHORTNESS OF BREATH  *UNUSUAL BRUISING OR BLEEDING  TENDERNESS IN MOUTH AND THROAT WITH OR WITHOUT PRESENCE OF ULCERS  *URINARY PROBLEMS  *BOWEL PROBLEMS  UNUSUAL RASH Items with * indicate a potential emergency and should be followed up as soon as possible.  Feel free to call the clinic should you have any questions or concerns. The clinic phone number is [phone].  Please show the [facility] at check-in to the Emergency Department and triage nurse.

## 2020-02-26 ENCOUNTER — Other Ambulatory Visit: Payer: Self-pay | Admitting: Family Medicine

## 2020-02-26 DIAGNOSIS — G629 Polyneuropathy, unspecified: Secondary | ICD-10-CM

## 2020-02-26 NOTE — Telephone Encounter (Signed)
Msg below

## 2020-02-26 NOTE — Telephone Encounter (Signed)
Medication Refill - Medication: traMADol (ULTRAM) 50 MG tablet    Preferred Pharmacy (with phone number or street name): [id number] - [place], [address] Phone:  [phone]  Fax:  [phone]       Agent: Please be advised that RX refills may take up to 3 business days. We ask that you follow-up with your pharmacy.

## 2020-02-26 NOTE — Telephone Encounter (Signed)
Requested medication (s) are due for refill today -yes  Requested medication (s) are on the active medication list -yes  Future visit scheduled -no  Last refill: 12/09/19 #30 1 RF  Notes to clinic: Request for non delegated Rx  Requested Prescriptions  Pending Prescriptions Disp Refills   traMADol (ULTRAM) 50 MG tablet 30 tablet 1    Sig: Take 1-2 tablets (50-100 mg total) by mouth at bedtime as needed.      Not Delegated - Analgesics:  Opioid Agonists Failed - 02/26/2020  1:57 PM      Failed - This refill cannot be delegated      Failed - Urine Drug Screen completed in last 360 days.      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      Not Delegated - Analgesics:  Opioid Agonists Failed - 02/26/2020  1:57 PM      Failed - This refill cannot be delegated      Failed - Urine Drug Screen completed in last 360 days.      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

## 2020-02-26 NOTE — Telephone Encounter (Signed)
Patient is requesting a refill of the following medications: Requested Prescriptions   Pending Prescriptions Disp Refills   traMADol (ULTRAM) 50 MG tablet 30 tablet 1    Sig: Take 1-2 tablets (50-100 mg total) by mouth at bedtime as needed.    Date of patient request: 02/26/20 Last office visit: 01/12/20 Date of last refill: 01/22/20 Last refill amount: 30-1Rf Follow up time period per chart:

## 2020-02-27 ENCOUNTER — Other Ambulatory Visit: Payer: Self-pay | Admitting: Internal Medicine

## 2020-02-27 MED ORDER — TRAMADOL HCL 50 MG PO TABS: 50.0000 mg | ORAL_TABLET | Freq: Every evening | ORAL | 1 refills | Status: AC | PRN

## 2020-02-27 NOTE — Telephone Encounter (Signed)
pmp reviewd, appropriate meds refilled 

## 2020-03-13 ENCOUNTER — Encounter: Payer: Self-pay | Admitting: Family Medicine

## 2020-03-16 ENCOUNTER — Encounter: Payer: Self-pay | Admitting: Internal Medicine

## 2020-03-16 ENCOUNTER — Inpatient Hospital Stay: Payer: Medicare PPO

## 2020-03-16 ENCOUNTER — Inpatient Hospital Stay: Payer: Medicare PPO | Admitting: Internal Medicine

## 2020-03-16 ENCOUNTER — Other Ambulatory Visit: Payer: Self-pay

## 2020-03-16 VITALS — BP 148/98

## 2020-03-16 VITALS — BP 142/102 | HR 95 | Temp 98.2°F | Resp 18 | Ht 66.0 in | Wt 174.4 lb

## 2020-03-16 DIAGNOSIS — C7931 Secondary malignant neoplasm of brain: Secondary | ICD-10-CM | POA: Diagnosis not present

## 2020-03-16 DIAGNOSIS — K5900 Constipation, unspecified: Secondary | ICD-10-CM | POA: Diagnosis not present

## 2020-03-16 DIAGNOSIS — I10 Essential (primary) hypertension: Secondary | ICD-10-CM

## 2020-03-16 DIAGNOSIS — C3431 Malignant neoplasm of lower lobe, right bronchus or lung: Secondary | ICD-10-CM

## 2020-03-16 DIAGNOSIS — Z5112 Encounter for antineoplastic immunotherapy: Secondary | ICD-10-CM

## 2020-03-16 DIAGNOSIS — R112 Nausea with vomiting, unspecified: Secondary | ICD-10-CM | POA: Diagnosis not present

## 2020-03-16 DIAGNOSIS — C3490 Malignant neoplasm of unspecified part of unspecified bronchus or lung: Secondary | ICD-10-CM

## 2020-03-16 DIAGNOSIS — Z95828 Presence of other vascular implants and grafts: Secondary | ICD-10-CM

## 2020-03-16 DIAGNOSIS — N289 Disorder of kidney and ureter, unspecified: Secondary | ICD-10-CM | POA: Diagnosis not present

## 2020-03-16 DIAGNOSIS — Z9221 Personal history of antineoplastic chemotherapy: Secondary | ICD-10-CM | POA: Diagnosis not present

## 2020-03-16 DIAGNOSIS — Z923 Personal history of irradiation: Secondary | ICD-10-CM | POA: Diagnosis not present

## 2020-03-16 DIAGNOSIS — D61818 Other pancytopenia: Secondary | ICD-10-CM | POA: Diagnosis not present

## 2020-03-16 LAB — CMP (CANCER CENTER ONLY)
ALT: 9 U/L (ref 0–44)
AST: 15 U/L (ref 15–41)
Albumin: 2.9 g/dL — ABNORMAL LOW (ref 3.5–5.0)
Alkaline Phosphatase: 72 U/L (ref 38–126)
Anion gap: 6 (ref 5–15)
BUN: 12 mg/dL (ref 8–23)
CO2: 27 mmol/L (ref 22–32)
Calcium: 9.6 mg/dL (ref 8.9–10.3)
Chloride: 109 mmol/L (ref 98–111)
Creatinine: 1.2 mg/dL — ABNORMAL HIGH (ref 0.44–1.00)
GFR, Est AFR Am: 54 mL/min — ABNORMAL LOW (ref >60)
GFR, Estimated: 47 mL/min — ABNORMAL LOW (ref >60)
Glucose, Bld: 93 mg/dL (ref 70–99)
Potassium: 3.5 mmol/L (ref 3.5–5.1)
Sodium: 142 mmol/L (ref 135–145)
Total Bilirubin: 0.4 mg/dL (ref 0.3–1.2)
Total Protein: 5.6 g/dL — ABNORMAL LOW (ref 6.5–8.1)

## 2020-03-16 LAB — TSH: TSH: 0.982 u[IU]/mL (ref 0.308–3.960)

## 2020-03-16 LAB — CBC WITH DIFFERENTIAL (CANCER CENTER ONLY)
Abs Immature Granulocytes: 0.01 10*3/uL (ref 0.00–0.07)
Basophils Absolute: 0 10*3/uL (ref 0.0–0.1)
Basophils Relative: 1 %
Eosinophils Absolute: 0.2 10*3/uL (ref 0.0–0.5)
Eosinophils Relative: 4 %
HCT: 29.1 % — ABNORMAL LOW (ref 36.0–46.0)
Hemoglobin: 9.3 g/dL — ABNORMAL LOW (ref 12.0–15.0)
Immature Granulocytes: 0 %
Lymphocytes Relative: 9 %
Lymphs Abs: 0.4 10*3/uL — ABNORMAL LOW (ref 0.7–4.0)
MCH: 33.8 pg (ref 26.0–34.0)
MCHC: 32 g/dL (ref 30.0–36.0)
MCV: 105.8 fL — ABNORMAL HIGH (ref 80.0–100.0)
Monocytes Absolute: 0.4 10*3/uL (ref 0.1–1.0)
Monocytes Relative: 9 %
Neutro Abs: 3.4 10*3/uL (ref 1.7–7.7)
Neutrophils Relative %: 77 %
Platelet Count: 129 10*3/uL — ABNORMAL LOW (ref 150–400)
RBC: 2.75 MIL/uL — ABNORMAL LOW (ref 3.87–5.11)
RDW: 21.4 % — ABNORMAL HIGH (ref 11.5–15.5)
WBC Count: 4.3 10*3/uL (ref 4.0–10.5)
nRBC: 0 % (ref 0.0–0.2)

## 2020-03-16 MED ORDER — HEPARIN SOD (PORK) LOCK FLUSH 100 UNIT/ML IV SOLN
500.0000 [IU] | Freq: Once | INTRAVENOUS | Status: AC | PRN
  Administered 2020-03-16: 500 [IU]
  Filled 2020-03-16: qty 5

## 2020-03-16 MED ORDER — SODIUM CHLORIDE 0.9 % IV SOLN
Freq: Once | INTRAVENOUS | Status: AC
  Filled 2020-03-16: qty 250

## 2020-03-16 MED ORDER — SODIUM CHLORIDE 0.9% FLUSH
10.0000 mL | INTRAVENOUS | Status: DC | PRN
  Administered 2020-03-16: 10 mL
  Filled 2020-03-16: qty 10

## 2020-03-16 MED ORDER — SODIUM CHLORIDE 0.9 % IV SOLN
1500.0000 mg | Freq: Once | INTRAVENOUS | Status: AC
  Administered 2020-03-16: 1500 mg via INTRAVENOUS
  Filled 2020-03-16: qty 30

## 2020-03-16 NOTE — Progress Notes (Signed)
[place] [phone]   [phone]  OFFICE PROGRESS NOTE  Rutherford Guys, MD [address] [name] [address]  DIAGNOSIS: Metastatic small cell lung cancer initially diagnosed as Limited stage (T2b, N2, M0) small cell lung cancer, pending further staging work-up diagnosed and January 2020.  She presented with right lower lobe lung mass in addition to right hilar and mediastinal lymphadenopathy.  She has evidence of metastatic disease to the brain in March 2021.  PRIOR THERAPY:  1) Systemic chemotherapy concurrent with radiation.  Chemotherapy withcisplatin 80 mg/M2 on day 1 and etoposide 100 mg/M2 on days 1, 2 and 3 every 3 weeks.First dose September 03, 2018. Status post5cycles.Dose reducedstarting fromcycle #4to cisplatin 40 mg/m2 and etoposide 80 mg/m2 due to renal insufficiency. 2) whole brain irradiation under the care of Dr. Sondra Come.  CURRENT THERAPY: Systemic chemotherapy with carboplatin for AUC of 5 on day 1, etoposide 100 mg/M2 on days 1, 2 and 3 with Neulasta support in addition to Imfinzi 1500 mg IV every 3 weeks during chemotherapy.   Status post 5 cycles.  Starting from cycle #5 she will be treated with maintenance Imfinzi every 4 weeks.   INTERVAL HISTORY: [name] 69 y.o. female returns to the clinic today for follow-up visit accompanied by her husband.  The patient is feeling fine today with no concerning complaints except for fatigue.  She denied having any chest pain, shortness of breath, cough or hemoptysis.  She denied having any fever or chills.  She has no nausea, vomiting, diarrhea or constipation.  She tolerated the first cycle of her maintenance treatment with Imfinzi fairly well.  She is here today for evaluation before starting cycle #2 of her treatment.  MEDICAL HISTORY: Past Medical History:  Diagnosis Date  . Arthritis   . Chronic pain   . Hyperlipidemia   . Hypertension   . Low blood magnesium  10/04/2018  . lung ca dx'd 06/2018  . Stroke ([place])    06/19/2018 minor    ALLERGIES:  has No Known Allergies.  MEDICATIONS:  Current Outpatient Medications  Medication Sig Dispense Refill  . acetaminophen (TYLENOL) 650 MG CR tablet Take 650 mg by mouth every 8 (eight) hours as needed for pain.    Marland Kitchen amLODipine (NORVASC) 10 MG tablet Take 1 tablet (10 mg total) by mouth daily as needed.    Marland Kitchen atorvastatin (LIPITOR) 80 MG tablet TAKE 1 TABLET (80 MG TOTAL) BY MOUTH DAILY AT 6 PM. 90 tablet 3  . Clobetasol Prop Emollient Base (CLOBETASOL PROPIONATE E) 0.05 % emollient cream Apply 1 application topically 2 (two) times daily. 60 g 3  . DULoxetine (CYMBALTA) 30 MG capsule TAKE 1 CAPSULE BY MOUTH EVERY DAY 90 capsule 1  . potassium chloride SA (KLOR-CON) 20 MEQ tablet Take 1 tablet (20 mEq total) by mouth daily. 7 tablet 0  . pregabalin (LYRICA) 50 MG capsule TAKE 1 CAPSULE 3 TIMES A DAY 90 capsule 2  . prochlorperazine (COMPAZINE) 10 MG tablet Take 1 tablet (10 mg total) by mouth every 6 (six) hours as needed for nausea or vomiting. 30 tablet 2  . traMADol (ULTRAM) 50 MG tablet Take 1-2 tablets (50-100 mg total) by mouth at bedtime as needed. 30 tablet 1  . XARELTO 20 MG TABS tablet TAKE 1 TABLET BY MOUTH EVERY DAY WITH SUPPER 90 tablet 0   No current facility-administered medications for this visit.   Facility-Administered Medications Ordered in Other Visits  Medication  Dose Route Frequency Provider Last Rate Last Admin  . cloNIDine (CATAPRES) tablet 0.2 mg  0.2 mg Oral Once Curt Bears, MD      . sodium chloride flush (NS) 0.9 % injection 10 mL  10 mL Intracatheter PRN Curt Bears, MD   10 mL at 03/16/20 1108    SURGICAL HISTORY:  Past Surgical History:  Procedure Laterality Date  . BIOPSY  06/19/2018   Procedure: BIOPSY;  Surgeon: Laurence Spates, MD;  Location: [facility];  Service: Endoscopy;;  . BIOPSY  10/11/2018   Procedure: BIOPSY;  Surgeon: Laurence Spates, MD;  Location:  [facility];  Service: Endoscopy;;  . ENDARTERECTOMY Right 06/26/2018   Procedure: ENDARTERECTOMY CAROTID RIGHT;  Surgeon: Serafina Mitchell, MD;  Location: [facility] OR;  Service: Vascular;  Laterality: Right;  . ESOPHAGOGASTRODUODENOSCOPY (EGD) WITH PROPOFOL N/A 06/19/2018   Procedure: ESOPHAGOGASTRODUODENOSCOPY (EGD) WITH PROPOFOL;  Surgeon: Laurence Spates, MD;  Location: [facility];  Service: Endoscopy;  Laterality: N/A;  . FLEXIBLE SIGMOIDOSCOPY N/A 10/11/2018   Procedure: FLEXIBLE SIGMOIDOSCOPY;  Surgeon: Laurence Spates, MD;  Location: [facility];  Service: Endoscopy;  Laterality: N/A;  . IR IMAGING GUIDED PORT INSERTION  10/07/2018  . PATCH ANGIOPLASTY Right 06/26/2018   Procedure: PATCH ANGIOPLASTY USING A XENOSURE 1cm x 6cm BIOLOGIC PATCH;  Surgeon: Serafina Mitchell, MD;  Location: [facility];  Service: Vascular;  Laterality: Right;  Marland Kitchen VIDEO BRONCHOSCOPY WITH ENDOBRONCHIAL ULTRASOUND N/A 08/14/2018   Procedure: VIDEO BRONCHOSCOPY WITH ENDOBRONCHIAL ULTRASOUND;  Surgeon: Collene Gobble, MD;  Location: [facility] OR;  Service: Thoracic;  Laterality: N/A;    REVIEW OF SYSTEMS:  A comprehensive review of systems was negative except for: Constitutional: positive for fatigue   PHYSICAL EXAMINATION: General appearance: alert, cooperative, fatigued and no distress Head: Normocephalic, without obvious abnormality, atraumatic Neck: no adenopathy, no JVD, supple, symmetrical, trachea midline and thyroid not enlarged, symmetric, no tenderness/mass/nodules Lymph nodes: Cervical, supraclavicular, and axillary nodes normal. Resp: clear to auscultation bilaterally Back: symmetric, no curvature. ROM normal. No CVA tenderness. Cardio: regular rate and rhythm, S1, S2 normal, no murmur, click, rub or gallop GI: soft, non-tender; bowel sounds normal; no masses,  no organomegaly Extremities: extremities normal, atraumatic, no cyanosis or edema  ECOG PERFORMANCE STATUS: 1 - Symptomatic but completely ambulatory  Blood  pressure (!) 142/102, pulse 95, temperature 98.2 F (36.8 C), resp. rate 18, height 5\' 6"  (1.676 m), weight 174 lb 6.4 oz (79.1 kg), SpO2 98 %.  LABORATORY DATA: Lab Results  Component Value Date   WBC 5.0 02/17/2020   HGB 8.4 (L) 02/17/2020   HCT 25.9 (L) 02/17/2020   MCV 97.0 02/17/2020   PLT 132 (L) 02/17/2020      Chemistry      Component Value Date/Time   NA 141 02/17/2020 1021   NA 141 10/13/2019 1558   K 3.4 (L) 02/17/2020 1021   CL 108 02/17/2020 1021   CO2 26 02/17/2020 1021   BUN 13 02/17/2020 1021   BUN 26 10/13/2019 1558   CREATININE 1.52 (H) 02/17/2020 1021      Component Value Date/Time   CALCIUM 9.4 02/17/2020 1021   ALKPHOS 85 02/17/2020 1021   AST 12 (L) 02/17/2020 1021   ALT <6 02/17/2020 1021   BILITOT 0.4 02/17/2020 1021       RADIOGRAPHIC STUDIES: No results found.  ASSESSMENT AND PLAN: This is a very pleasant 69 years old white female with relapsed small cell lung cancer initially diagnosed as limited stage small cell lung cancer diagnosed  in January 2020 status post systemic chemotherapy initially with cisplatin and etoposide status post 5 cycles.  Her dose of cisplatin and etoposide was significantly reduced starting from cycle #4 secondary to worsening renal insufficiency.  Her treatment was concurrent with radiotherapy. The patient had evidence for metastatic disease to the brain and bone in March 2021. She underwent whole brain irradiation under the care of Dr. Sondra Come. Her scan showed no clear evidence for progression in the lung but there was new sclerotic bone metastasis. The patient started systemic chemotherapy with carboplatin for AUC of 5 on day 1, etoposide 100 mg/M2 on days 1, 2, 3 with Neulasta support in addition to Imfinzi 1500 mg IV every 3 weeks, status post 5 cycles.  Starting from cycle #5 she is on maintenance treatment with Imfinzi 1500 mg IV every 4 weeks.  She tolerated the last cycle of her treatment well except for fatigue I  recommended for the patient to proceed with cycle #6 today as planned. I will see her back for follow-up visit in 4 weeks for evaluation with repeat CT scan of the chest, abdomen pelvis for restaging of her disease. For the anemia, she was advised to take over-the-counter iron tablets 1-2 tablets every day. Regarding the history of pulmonary embolism, she will continue her current treatment with Xarelto. The patient was advised to call immediately if she has any concerning symptoms in the interval. The patient voices understanding of current disease status and treatment options and is in agreement with the current care plan.  All questions were answered. The patient knows to call the clinic with any problems, questions or concerns. We can certainly see the patient much sooner if necessary.   Disclaimer: This note was dictated with voice recognition software. Similar sounding words can inadvertently be transcribed and may not be corrected upon review.

## 2020-03-16 NOTE — Patient Instructions (Signed)
[facility] Discharge Instructions for Patients Receiving Chemotherapy  Today you received the following chemotherapy agents: Imfinzi  To help prevent nausea and vomiting after your treatment, we encourage you to take your nausea medication as prescribed.   If you develop nausea and vomiting that is not controlled by your nausea medication, call the clinic.   BELOW ARE SYMPTOMS THAT SHOULD BE REPORTED IMMEDIATELY:  *FEVER GREATER THAN 100.5 F  *CHILLS WITH OR WITHOUT FEVER  NAUSEA AND VOMITING THAT IS NOT CONTROLLED WITH YOUR NAUSEA MEDICATION  *UNUSUAL SHORTNESS OF BREATH  *UNUSUAL BRUISING OR BLEEDING  TENDERNESS IN MOUTH AND THROAT WITH OR WITHOUT PRESENCE OF ULCERS  *URINARY PROBLEMS  *BOWEL PROBLEMS  UNUSUAL RASH Items with * indicate a potential emergency and should be followed up as soon as possible.  Feel free to call the clinic should you have any questions or concerns. The clinic phone number is [phone].  Please show the [place] at check-in to the Emergency Department and triage nurse.

## 2020-03-17 ENCOUNTER — Telehealth: Payer: Self-pay | Admitting: Internal Medicine

## 2020-03-17 NOTE — Telephone Encounter (Signed)
Scheduled appt per 8/31 los - pt to get an updated schedule next visit.

## 2020-03-25 ENCOUNTER — Other Ambulatory Visit: Payer: Self-pay | Admitting: *Deleted

## 2020-03-25 DIAGNOSIS — Z95828 Presence of other vascular implants and grafts: Secondary | ICD-10-CM

## 2020-03-29 ENCOUNTER — Encounter: Payer: Self-pay | Admitting: Family Medicine

## 2020-03-30 ENCOUNTER — Other Ambulatory Visit: Payer: Self-pay | Admitting: Internal Medicine

## 2020-04-05 ENCOUNTER — Encounter: Payer: Self-pay | Admitting: Internal Medicine

## 2020-04-05 ENCOUNTER — Other Ambulatory Visit: Payer: Self-pay | Admitting: Radiation Therapy

## 2020-04-09 ENCOUNTER — Encounter (HOSPITAL_COMMUNITY): Payer: Self-pay

## 2020-04-09 ENCOUNTER — Other Ambulatory Visit: Payer: Self-pay | Admitting: Internal Medicine

## 2020-04-09 ENCOUNTER — Ambulatory Visit (HOSPITAL_COMMUNITY)
Admission: RE | Admit: 2020-04-09 | Discharge: 2020-04-09 | Disposition: A | Discharge status: Home / Self Care | Payer: Medicare PPO | Source: Ambulatory Visit | Attending: Internal Medicine | Admitting: Internal Medicine

## 2020-04-09 ENCOUNTER — Other Ambulatory Visit: Payer: Self-pay

## 2020-04-09 DIAGNOSIS — I714 Abdominal aortic aneurysm, without rupture: Secondary | ICD-10-CM | POA: Diagnosis not present

## 2020-04-09 DIAGNOSIS — S22050A Wedge compression fracture of T5-T6 vertebra, initial encounter for closed fracture: Secondary | ICD-10-CM | POA: Diagnosis not present

## 2020-04-09 DIAGNOSIS — I700 Atherosclerosis of aorta: Secondary | ICD-10-CM | POA: Diagnosis not present

## 2020-04-09 DIAGNOSIS — Z85118 Personal history of other malignant neoplasm of bronchus and lung: Secondary | ICD-10-CM | POA: Diagnosis not present

## 2020-04-09 DIAGNOSIS — C3490 Malignant neoplasm of unspecified part of unspecified bronchus or lung: Secondary | ICD-10-CM | POA: Diagnosis not present

## 2020-04-09 MED ORDER — IOHEXOL 300 MG/ML  SOLN
100.0000 mL | Freq: Once | INTRAMUSCULAR | Status: AC | PRN
  Administered 2020-04-09: 100 mL via INTRAVENOUS

## 2020-04-09 MED ORDER — HEPARIN SOD (PORK) LOCK FLUSH 100 UNIT/ML IV SOLN
500.0000 [IU] | Freq: Once | INTRAVENOUS | Status: AC
  Administered 2020-04-09: 500 [IU] via INTRAVENOUS

## 2020-04-09 MED ORDER — DEXAMETHASONE 4 MG PO TABS
4.0000 mg | ORAL_TABLET | Freq: Every day | ORAL | 0 refills | Status: DC
Start: 2020-04-09 — End: 2020-04-19

## 2020-04-09 MED ORDER — HEPARIN SOD (PORK) LOCK FLUSH 100 UNIT/ML IV SOLN
INTRAVENOUS | Status: AC
  Filled 2020-04-09: qty 5

## 2020-04-13 ENCOUNTER — Encounter: Payer: Self-pay | Admitting: Internal Medicine

## 2020-04-13 ENCOUNTER — Other Ambulatory Visit: Payer: Self-pay

## 2020-04-13 ENCOUNTER — Inpatient Hospital Stay: Payer: Medicare PPO

## 2020-04-13 ENCOUNTER — Inpatient Hospital Stay: Payer: Medicare PPO | Attending: Internal Medicine | Admitting: Internal Medicine

## 2020-04-13 VITALS — BP 191/121 | HR 83 | Temp 98.5°F | Resp 18 | Ht 66.0 in | Wt 164.8 lb

## 2020-04-13 VITALS — BP 156/110

## 2020-04-13 DIAGNOSIS — Z7901 Long term (current) use of anticoagulants: Secondary | ICD-10-CM | POA: Insufficient documentation

## 2020-04-13 DIAGNOSIS — C781 Secondary malignant neoplasm of mediastinum: Secondary | ICD-10-CM | POA: Diagnosis not present

## 2020-04-13 DIAGNOSIS — Z7952 Long term (current) use of systemic steroids: Secondary | ICD-10-CM | POA: Insufficient documentation

## 2020-04-13 DIAGNOSIS — C7931 Secondary malignant neoplasm of brain: Secondary | ICD-10-CM | POA: Insufficient documentation

## 2020-04-13 DIAGNOSIS — I10 Essential (primary) hypertension: Secondary | ICD-10-CM

## 2020-04-13 DIAGNOSIS — Z86711 Personal history of pulmonary embolism: Secondary | ICD-10-CM | POA: Diagnosis not present

## 2020-04-13 DIAGNOSIS — Z5112 Encounter for antineoplastic immunotherapy: Secondary | ICD-10-CM | POA: Diagnosis not present

## 2020-04-13 DIAGNOSIS — R5383 Other fatigue: Secondary | ICD-10-CM | POA: Diagnosis not present

## 2020-04-13 DIAGNOSIS — R410 Disorientation, unspecified: Secondary | ICD-10-CM | POA: Diagnosis not present

## 2020-04-13 DIAGNOSIS — Z79899 Other long term (current) drug therapy: Secondary | ICD-10-CM | POA: Diagnosis not present

## 2020-04-13 DIAGNOSIS — C3431 Malignant neoplasm of lower lobe, right bronchus or lung: Secondary | ICD-10-CM

## 2020-04-13 DIAGNOSIS — Z95828 Presence of other vascular implants and grafts: Secondary | ICD-10-CM

## 2020-04-13 LAB — CMP (CANCER CENTER ONLY)
ALT: 17 U/L (ref 0–44)
AST: 19 U/L (ref 15–41)
Albumin: 3.5 g/dL (ref 3.5–5.0)
Alkaline Phosphatase: 61 U/L (ref 38–126)
Anion gap: 6 (ref 5–15)
BUN: 19 mg/dL (ref 8–23)
CO2: 28 mmol/L (ref 22–32)
Calcium: 9.4 mg/dL (ref 8.9–10.3)
Chloride: 105 mmol/L (ref 98–111)
Creatinine: 1.78 mg/dL — ABNORMAL HIGH (ref 0.44–1.00)
GFR, Est AFR Am: 34 mL/min — ABNORMAL LOW (ref >60)
GFR, Estimated: 29 mL/min — ABNORMAL LOW (ref >60)
Glucose, Bld: 117 mg/dL — ABNORMAL HIGH (ref 70–99)
Potassium: 3.7 mmol/L (ref 3.5–5.1)
Sodium: 139 mmol/L (ref 135–145)
Total Bilirubin: 0.5 mg/dL (ref 0.3–1.2)
Total Protein: 6.5 g/dL (ref 6.5–8.1)

## 2020-04-13 LAB — CBC WITH DIFFERENTIAL (CANCER CENTER ONLY)
Abs Immature Granulocytes: 0.02 10*3/uL (ref 0.00–0.07)
Basophils Absolute: 0 10*3/uL (ref 0.0–0.1)
Basophils Relative: 0 %
Eosinophils Absolute: 0 10*3/uL (ref 0.0–0.5)
Eosinophils Relative: 0 %
HCT: 33.5 % — ABNORMAL LOW (ref 36.0–46.0)
Hemoglobin: 10.8 g/dL — ABNORMAL LOW (ref 12.0–15.0)
Immature Granulocytes: 0 %
Lymphocytes Relative: 5 %
Lymphs Abs: 0.3 10*3/uL — ABNORMAL LOW (ref 0.7–4.0)
MCH: 34.2 pg — ABNORMAL HIGH (ref 26.0–34.0)
MCHC: 32.2 g/dL (ref 30.0–36.0)
MCV: 106 fL — ABNORMAL HIGH (ref 80.0–100.0)
Monocytes Absolute: 0.1 10*3/uL (ref 0.1–1.0)
Monocytes Relative: 2 %
Neutro Abs: 5.3 10*3/uL (ref 1.7–7.7)
Neutrophils Relative %: 93 %
Platelet Count: 145 10*3/uL — ABNORMAL LOW (ref 150–400)
RBC: 3.16 MIL/uL — ABNORMAL LOW (ref 3.87–5.11)
RDW: 16.4 % — ABNORMAL HIGH (ref 11.5–15.5)
WBC Count: 5.8 10*3/uL (ref 4.0–10.5)
nRBC: 0 % (ref 0.0–0.2)

## 2020-04-13 LAB — TSH: TSH: 0.723 u[IU]/mL (ref 0.308–3.960)

## 2020-04-13 MED ORDER — SODIUM CHLORIDE 0.9% FLUSH
10.0000 mL | INTRAVENOUS | Status: DC | PRN
  Administered 2020-04-13: 10 mL
  Filled 2020-04-13: qty 10

## 2020-04-13 MED ORDER — CLONIDINE HCL 0.1 MG PO TABS
0.2000 mg | ORAL_TABLET | Freq: Once | ORAL | Status: AC
  Administered 2020-04-13: 0.2 mg via ORAL

## 2020-04-13 MED ORDER — SODIUM CHLORIDE 0.9 % IV SOLN
Freq: Once | INTRAVENOUS | Status: AC
  Filled 2020-04-13: qty 250

## 2020-04-13 MED ORDER — SODIUM CHLORIDE 0.9 % IV SOLN
1500.0000 mg | Freq: Once | INTRAVENOUS | Status: AC
  Administered 2020-04-13: 1500 mg via INTRAVENOUS
  Filled 2020-04-13: qty 30

## 2020-04-13 MED ORDER — CLONIDINE HCL 0.1 MG PO TABS: 0.2000 mg | ORAL_TABLET | Freq: Once | ORAL | Status: DC

## 2020-04-13 MED ORDER — CLONIDINE HCL 0.1 MG PO TABS
ORAL_TABLET | ORAL | Status: AC
  Filled 2020-04-13: qty 2

## 2020-04-13 MED ORDER — HEPARIN SOD (PORK) LOCK FLUSH 100 UNIT/ML IV SOLN
500.0000 [IU] | Freq: Once | INTRAVENOUS | Status: AC | PRN
  Administered 2020-04-13: 500 [IU]
  Filled 2020-04-13: qty 5

## 2020-04-13 NOTE — Patient Instructions (Signed)
[facility] Discharge Instructions for Patients Receiving Chemotherapy  Today you received the following chemotherapy agent: Imfinzi  To help prevent nausea and vomiting after your treatment, we encourage you to take your nausea medication as directed by your MD   If you develop nausea and vomiting that is not controlled by your nausea medication, call the clinic.   BELOW ARE SYMPTOMS THAT SHOULD BE REPORTED IMMEDIATELY:  *FEVER GREATER THAN 100.5 F  *CHILLS WITH OR WITHOUT FEVER  NAUSEA AND VOMITING THAT IS NOT CONTROLLED WITH YOUR NAUSEA MEDICATION  *UNUSUAL SHORTNESS OF BREATH  *UNUSUAL BRUISING OR BLEEDING  TENDERNESS IN MOUTH AND THROAT WITH OR WITHOUT PRESENCE OF ULCERS  *URINARY PROBLEMS  *BOWEL PROBLEMS  UNUSUAL RASH Items with * indicate a potential emergency and should be followed up as soon as possible.  Feel free to call the clinic should you have any questions or concerns. The clinic phone number is [phone].  Please show the [place] at check-in to the Emergency Department and triage nurse.

## 2020-04-13 NOTE — Progress Notes (Signed)
Ok to treat with elevated Scr per MD.  Hardie Pulley, PharmD, BCPS, BCOP

## 2020-04-13 NOTE — Progress Notes (Signed)
Ok to treat despite elevated BP & elevated creatinine per Dr Julien Nordmann but Dr Julien Nordmann ordered Clonidine.  Please see orders.

## 2020-04-13 NOTE — Progress Notes (Signed)
[place] [phone]   [phone]  OFFICE PROGRESS NOTE  Rutherford Guys, MD [address] [name] [address]  DIAGNOSIS: Metastatic small cell lung cancer initially diagnosed as Limited stage (T2b, N2, M0) small cell lung cancer, pending further staging work-up diagnosed and January 2020.  She presented with right lower lobe lung mass in addition to right hilar and mediastinal lymphadenopathy.  She has evidence of metastatic disease to the brain in March 2021.  PRIOR THERAPY:  1) Systemic chemotherapy concurrent with radiation.  Chemotherapy withcisplatin 80 mg/M2 on day 1 and etoposide 100 mg/M2 on days 1, 2 and 3 every 3 weeks.First dose September 03, 2018. Status post5cycles.Dose reducedstarting fromcycle #4to cisplatin 40 mg/m2 and etoposide 80 mg/m2 due to renal insufficiency. 2) whole brain irradiation under the care of Dr. Sondra Come.  CURRENT THERAPY: Systemic chemotherapy with carboplatin for AUC of 5 on day 1, etoposide 100 mg/M2 on days 1, 2 and 3 with Neulasta support in addition to Imfinzi 1500 mg IV every 3 weeks during chemotherapy.   Status post 6 cycles.  Starting from cycle #5 she will be treated with maintenance Imfinzi every 4 weeks.   INTERVAL HISTORY: [name] 69 y.o. female returns to the clinic today for follow-up visit accompanied by her husband.  The patient is feeling fine today with no concerning complaints except for fatigue and occasional confusion.  She denied having any current chest pain but has shortness of breath with exertion with no cough or hemoptysis.  She denied having any fever or chills.  She has no nausea, vomiting, diarrhea or constipation.  She denied having any headache or visual changes.  She continues to tolerate her treatment with Imfinzi fairly well.  The patient is here today for evaluation with repeat CT scan of the chest, abdomen pelvis for restaging of her disease.  Because of her  confusion she was seen recently by Dr. Mickeal Skinner and started on Decadron 4 mg p.o. daily for concern of vasogenic edema.  MEDICAL HISTORY: Past Medical History:  Diagnosis Date  . Arthritis   . Chronic pain   . Hyperlipidemia   . Hypertension   . Low blood magnesium 10/04/2018  . lung ca dx'd 06/2018  . Stroke ([place])    06/19/2018 minor    ALLERGIES:  has No Known Allergies.  MEDICATIONS:  Current Outpatient Medications  Medication Sig Dispense Refill  . acetaminophen (TYLENOL) 650 MG CR tablet Take 650 mg by mouth every 8 (eight) hours as needed for pain.    Marland Kitchen amLODipine (NORVASC) 10 MG tablet Take 1 tablet (10 mg total) by mouth daily as needed.    Marland Kitchen atorvastatin (LIPITOR) 80 MG tablet TAKE 1 TABLET (80 MG TOTAL) BY MOUTH DAILY AT 6 PM. 90 tablet 3  . Clobetasol Prop Emollient Base (CLOBETASOL PROPIONATE E) 0.05 % emollient cream Apply 1 application topically 2 (two) times daily. 60 g 3  . dexamethasone (DECADRON) 4 MG tablet Take 1 tablet (4 mg total) by mouth daily. 30 tablet 0  . DULoxetine (CYMBALTA) 30 MG capsule TAKE 1 CAPSULE BY MOUTH EVERY DAY 90 capsule 1  . potassium chloride SA (KLOR-CON) 20 MEQ tablet Take 1 tablet (20 mEq total) by mouth daily. 7 tablet 0  . pregabalin (LYRICA) 50 MG capsule TAKE 1 CAPSULE 3 TIMES A DAY 90 capsule 2  . prochlorperazine (COMPAZINE) 10 MG tablet Take 1 tablet (10 mg total) by mouth every 6 (six) hours as  needed for nausea or vomiting. 30 tablet 2  . traMADol (ULTRAM) 50 MG tablet Take 1-2 tablets (50-100 mg total) by mouth at bedtime as needed. 30 tablet 1  . XARELTO 20 MG TABS tablet TAKE 1 TABLET BY MOUTH EVERY DAY WITH SUPPER 90 tablet 0   No current facility-administered medications for this visit.   Facility-Administered Medications Ordered in Other Visits  Medication Dose Route Frequency Provider Last Rate Last Admin  . cloNIDine (CATAPRES) tablet 0.2 mg  0.2 mg Oral Once Curt Bears, MD        SURGICAL HISTORY:  Past  Surgical History:  Procedure Laterality Date  . BIOPSY  06/19/2018   Procedure: BIOPSY;  Surgeon: Laurence Spates, MD;  Location: [facility];  Service: Endoscopy;;  . BIOPSY  10/11/2018   Procedure: BIOPSY;  Surgeon: Laurence Spates, MD;  Location: [facility];  Service: Endoscopy;;  . ENDARTERECTOMY Right 06/26/2018   Procedure: ENDARTERECTOMY CAROTID RIGHT;  Surgeon: Serafina Mitchell, MD;  Location: [facility] OR;  Service: Vascular;  Laterality: Right;  . ESOPHAGOGASTRODUODENOSCOPY (EGD) WITH PROPOFOL N/A 06/19/2018   Procedure: ESOPHAGOGASTRODUODENOSCOPY (EGD) WITH PROPOFOL;  Surgeon: Laurence Spates, MD;  Location: [facility];  Service: Endoscopy;  Laterality: N/A;  . FLEXIBLE SIGMOIDOSCOPY N/A 10/11/2018   Procedure: FLEXIBLE SIGMOIDOSCOPY;  Surgeon: Laurence Spates, MD;  Location: [facility];  Service: Endoscopy;  Laterality: N/A;  . IR IMAGING GUIDED PORT INSERTION  10/07/2018  . PATCH ANGIOPLASTY Right 06/26/2018   Procedure: PATCH ANGIOPLASTY USING A XENOSURE 1cm x 6cm BIOLOGIC PATCH;  Surgeon: Serafina Mitchell, MD;  Location: [facility];  Service: Vascular;  Laterality: Right;  Marland Kitchen VIDEO BRONCHOSCOPY WITH ENDOBRONCHIAL ULTRASOUND N/A 08/14/2018   Procedure: VIDEO BRONCHOSCOPY WITH ENDOBRONCHIAL ULTRASOUND;  Surgeon: Collene Gobble, MD;  Location: [facility] OR;  Service: Thoracic;  Laterality: N/A;    REVIEW OF SYSTEMS:  Constitutional: positive for fatigue Eyes: negative Ears, nose, mouth, throat, and face: negative Respiratory: positive for dyspnea on exertion Cardiovascular: negative Gastrointestinal: negative Genitourinary:negative Integument/breast: negative Hematologic/lymphatic: negative Musculoskeletal:positive for muscle weakness Neurological: negative Behavioral/Psych: negative Endocrine: negative Allergic/Immunologic: negative   PHYSICAL EXAMINATION: General appearance: alert, cooperative, fatigued and no distress Head: Normocephalic, without obvious abnormality, atraumatic Neck: no  adenopathy, no JVD, supple, symmetrical, trachea midline and thyroid not enlarged, symmetric, no tenderness/mass/nodules Lymph nodes: Cervical, supraclavicular, and axillary nodes normal. Resp: clear to auscultation bilaterally Back: symmetric, no curvature. ROM normal. No CVA tenderness. Cardio: regular rate and rhythm, S1, S2 normal, no murmur, click, rub or gallop GI: soft, non-tender; bowel sounds normal; no masses,  no organomegaly Extremities: extremities normal, atraumatic, no cyanosis or edema Neurologic: Alert and oriented X 3, normal strength and tone. Normal symmetric reflexes. Normal coordination and gait  ECOG PERFORMANCE STATUS: 1 - Symptomatic but completely ambulatory  Blood pressure (!) 191/121, pulse 83, temperature 98.5 F (36.9 C), temperature source Tympanic, resp. rate 18, height 5\' 6"  (1.676 m), weight 164 lb 12.8 oz (74.8 kg), SpO2 98 %.  LABORATORY DATA: Lab Results  Component Value Date   WBC 5.8 04/13/2020   HGB 10.8 (L) 04/13/2020   HCT 33.5 (L) 04/13/2020   MCV 106.0 (H) 04/13/2020   PLT 145 (L) 04/13/2020      Chemistry      Component Value Date/Time   NA 142 03/16/2020 1108   NA 141 10/13/2019 1558   K 3.5 03/16/2020 1108   CL 109 03/16/2020 1108   CO2 27 03/16/2020 1108   BUN 12 03/16/2020 1108   BUN 26  10/13/2019 1558   CREATININE 1.20 (H) 03/16/2020 1108      Component Value Date/Time   CALCIUM 9.6 03/16/2020 1108   ALKPHOS 72 03/16/2020 1108   AST 15 03/16/2020 1108   ALT 9 03/16/2020 1108   BILITOT 0.4 03/16/2020 1108       RADIOGRAPHIC STUDIES: CT Chest W Contrast  Result Date: 04/10/2020 CLINICAL DATA:  Patient with history of small cell lung cancer. Follow-up exam. EXAM: CT CHEST, ABDOMEN, AND PELVIS WITH CONTRAST TECHNIQUE: Multidetector CT imaging of the chest, abdomen and pelvis was performed following the standard protocol during bolus administration of intravenous contrast. CONTRAST:  148mL OMNIPAQUE IOHEXOL 300 MG/ML  SOLN  COMPARISON:  CT C AP 02/05/2020 FINDINGS: CT CHEST FINDINGS Cardiovascular: Right anterior chest wall Port-A-Cath is present with tip terminating in the right atrium. Stable cardiac and mediastinal contours. Persistent moderate pericardial effusion. Coronary arterial vascular calcifications. Stable dilatation of the aortic arch measuring 3.6 cm. Mediastinum/Nodes: Similar-appearing 3.2 cm right paratracheal node (image 18; series 2). Similar-appearing 1.2 cm subcarinal node (image 23; series 2). No axillary adenopathy. Lungs/Pleura: Central airways are patent. Persistent small right pleural effusion. Redemonstrated right paramediastinal post radiation changes within the right upper lobe and right lower lobe. Interval resolution of previously visualized patchy areas of ground-glass attenuation within the right lower lobe. Masslike architectural distortion within the right mid lung with surrounding fibrosis and volume loss is similar to prior. Measurement at the level of the underlying treated tumor is approximately 2.3 cm, previously 2.4 cm. Similar-appearing small scattered nodules predominately within the right upper lobe. Interval development of a 5 mm left lower lobe nodule (image 77; series 6). No pneumothorax. Musculoskeletal: Similar-appearing wedge compression deformity of the T6 vertebral body with associated sclerosis of the posterior element. Mild patchy sclerosis involving the manubrium (image 102; series 5). CT ABDOMEN PELVIS FINDINGS Hepatobiliary: The liver is normal in size and contour. No focal hepatic lesions identified. Gallbladder is unremarkable. Pancreas: Unremarkable Spleen: Unremarkable Adrenals/Urinary Tract: Similar-appearing 1.1 cm nodule left adrenal gland (image 54; series 2). Normal appearance of the right adrenal gland. Kidneys enhance symmetrically with contrast. No hydronephrosis. No suspicious renal masses. Urinary bladder is unremarkable. Stomach/Bowel: No abnormal bowel wall  thickening or evidence for bowel obstruction. No free fluid or free intraperitoneal air. Normal morphology of the stomach. Vascular/Lymphatic: Peripheral calcified atherosclerotic plaque involving the abdominal aorta. Similar-appearing 3 cm aneurysmal dilatation of the abdominal aorta. No retroperitoneal or pelvic lymphadenopathy. Reproductive: Uterus and adnexal structures are unremarkable. Other: None. Musculoskeletal: Redemonstrated left hip joint degenerative changes. Redemonstrated anterior endplate sclerosis of the T11, T12 and L1 vertebral bodies, likely degenerative in etiology similar-appearing sclerosis involving the posterior elements of the L5 vertebral body, likely degenerative in etiology. IMPRESSION: 1. Interval resolution of previously visualized patchy areas of ground-glass attenuation within the right lower lobe. 2. Similar-appearing masslike architectural distortion within the right mid lung with surrounding fibrosis and volume loss. 3. Similar-appearing mediastinal adenopathy. 4. Similar-appearing sclerosis involving the posterior elements of the T6 vertebral body with associated T6 compression fracture. 5. Interval development of a 5 mm left lower lobe nodule. Recommend attention on follow-up. 6. Similar-appearing moderate pericardial effusion. 7. Similar-appearing small right pleural effusion. 8. Similar-appearing 3 cm abdominal aortic aneurysm. Recommend attention on follow-up. 9. Aortic atherosclerosis. 1 Electronically Signed   By: Lovey Newcomer M.D.   On: 04/10/2020 11:01   CT Abdomen Pelvis W Contrast  Result Date: 04/10/2020 CLINICAL DATA:  Patient with history of small cell lung cancer. Follow-up exam.  EXAM: CT CHEST, ABDOMEN, AND PELVIS WITH CONTRAST TECHNIQUE: Multidetector CT imaging of the chest, abdomen and pelvis was performed following the standard protocol during bolus administration of intravenous contrast. CONTRAST:  138mL OMNIPAQUE IOHEXOL 300 MG/ML  SOLN COMPARISON:  CT C  AP 02/05/2020 FINDINGS: CT CHEST FINDINGS Cardiovascular: Right anterior chest wall Port-A-Cath is present with tip terminating in the right atrium. Stable cardiac and mediastinal contours. Persistent moderate pericardial effusion. Coronary arterial vascular calcifications. Stable dilatation of the aortic arch measuring 3.6 cm. Mediastinum/Nodes: Similar-appearing 3.2 cm right paratracheal node (image 18; series 2). Similar-appearing 1.2 cm subcarinal node (image 23; series 2). No axillary adenopathy. Lungs/Pleura: Central airways are patent. Persistent small right pleural effusion. Redemonstrated right paramediastinal post radiation changes within the right upper lobe and right lower lobe. Interval resolution of previously visualized patchy areas of ground-glass attenuation within the right lower lobe. Masslike architectural distortion within the right mid lung with surrounding fibrosis and volume loss is similar to prior. Measurement at the level of the underlying treated tumor is approximately 2.3 cm, previously 2.4 cm. Similar-appearing small scattered nodules predominately within the right upper lobe. Interval development of a 5 mm left lower lobe nodule (image 77; series 6). No pneumothorax. Musculoskeletal: Similar-appearing wedge compression deformity of the T6 vertebral body with associated sclerosis of the posterior element. Mild patchy sclerosis involving the manubrium (image 102; series 5). CT ABDOMEN PELVIS FINDINGS Hepatobiliary: The liver is normal in size and contour. No focal hepatic lesions identified. Gallbladder is unremarkable. Pancreas: Unremarkable Spleen: Unremarkable Adrenals/Urinary Tract: Similar-appearing 1.1 cm nodule left adrenal gland (image 54; series 2). Normal appearance of the right adrenal gland. Kidneys enhance symmetrically with contrast. No hydronephrosis. No suspicious renal masses. Urinary bladder is unremarkable. Stomach/Bowel: No abnormal bowel wall thickening or evidence  for bowel obstruction. No free fluid or free intraperitoneal air. Normal morphology of the stomach. Vascular/Lymphatic: Peripheral calcified atherosclerotic plaque involving the abdominal aorta. Similar-appearing 3 cm aneurysmal dilatation of the abdominal aorta. No retroperitoneal or pelvic lymphadenopathy. Reproductive: Uterus and adnexal structures are unremarkable. Other: None. Musculoskeletal: Redemonstrated left hip joint degenerative changes. Redemonstrated anterior endplate sclerosis of the T11, T12 and L1 vertebral bodies, likely degenerative in etiology similar-appearing sclerosis involving the posterior elements of the L5 vertebral body, likely degenerative in etiology. IMPRESSION: 1. Interval resolution of previously visualized patchy areas of ground-glass attenuation within the right lower lobe. 2. Similar-appearing masslike architectural distortion within the right mid lung with surrounding fibrosis and volume loss. 3. Similar-appearing mediastinal adenopathy. 4. Similar-appearing sclerosis involving the posterior elements of the T6 vertebral body with associated T6 compression fracture. 5. Interval development of a 5 mm left lower lobe nodule. Recommend attention on follow-up. 6. Similar-appearing moderate pericardial effusion. 7. Similar-appearing small right pleural effusion. 8. Similar-appearing 3 cm abdominal aortic aneurysm. Recommend attention on follow-up. 9. Aortic atherosclerosis. 1 Electronically Signed   By: Lovey Newcomer M.D.   On: 04/10/2020 11:01    ASSESSMENT AND PLAN: This is a very pleasant 69 years old white female with relapsed small cell lung cancer initially diagnosed as limited stage small cell lung cancer diagnosed in January 2020 status post systemic chemotherapy initially with cisplatin and etoposide status post 5 cycles.  Her dose of cisplatin and etoposide was significantly reduced starting from cycle #4 secondary to worsening renal insufficiency.  Her treatment was  concurrent with radiotherapy. The patient had evidence for metastatic disease to the brain and bone in March 2021. She underwent whole brain irradiation under the care of Dr.  Kinard. Her scan showed no clear evidence for progression in the lung but there was new sclerotic bone metastasis. The patient started systemic chemotherapy with carboplatin for AUC of 5 on day 1, etoposide 100 mg/M2 on days 1, 2, 3 with Neulasta support in addition to Imfinzi 1500 mg IV every 3 weeks, status post 6 cycles.  Starting from cycle #5 she is on maintenance treatment with Imfinzi 1500 mg IV every 4 weeks.  She tolerated the last cycle of her treatment well except for fatigue The patient continues to tolerate this treatment well except for the fatigue and recent confusion probably secondary to her brain metastasis. She had repeat CT scan of the chest, abdomen pelvis performed recently.  I personally and independently reviewed the scans and discussed the results with the patient and her husband. Her scan showed no concerning findings for disease progression and there was resolution of the inflammatory process in the lung. I recommended for her to proceed with cycle #7 today as planned. For the suspicious vasogenic edema and brain metastasis, she is currently on Decadron 4 mg p.o. daily and hopefully this will be tapered slowly over the next few weeks. She has MRI of the brain is scheduled to be done in around 2 weeks. The patient will come back for follow-up visit in 4 weeks for evaluation before the next cycle of her treatment. For the history of pulmonary embolism, she will continue his current treatment with Xarelto. For the anemia she will continue on oral iron tablets for now. She was advised to call immediately if she has any concerning symptoms in the interval. The patient voices understanding of current disease status and treatment options and is in agreement with the current care plan.  All questions were  answered. The patient knows to call the clinic with any problems, questions or concerns. We can certainly see the patient much sooner if necessary.   Disclaimer: This note was dictated with voice recognition software. Similar sounding words can inadvertently be transcribed and may not be corrected upon review.

## 2020-04-19 ENCOUNTER — Telehealth: Payer: Self-pay

## 2020-04-19 ENCOUNTER — Other Ambulatory Visit: Payer: Self-pay | Admitting: Internal Medicine

## 2020-04-19 ENCOUNTER — Other Ambulatory Visit: Payer: Self-pay | Admitting: Family Medicine

## 2020-04-19 MED ORDER — DEXAMETHASONE 4 MG PO TABS: 4.0000 mg | ORAL_TABLET | Freq: Two times a day (BID) | ORAL | 3 refills | Status: AC

## 2020-04-19 NOTE — Telephone Encounter (Signed)
Copied from [place] [id number]. Topic: Compliment - Provider (non-sensitive) >> Mar 29, 2020 12:07 PM Greggory Keen D wrote: Pt called to say she had got her letter that Dr. Pamella Pert was leaving and she wanted to make sure that she let her know how much she appreciated her and how she has helped her so much during her care with her. >> Apr 15, 2020  6:05 PM Rutherford Guys, MD wrote: Greatly appreciated. It was my pleasure to care for her.

## 2020-04-20 ENCOUNTER — Other Ambulatory Visit: Payer: Self-pay

## 2020-04-20 ENCOUNTER — Telehealth: Payer: Self-pay

## 2020-04-20 NOTE — Telephone Encounter (Signed)
Not prescribed by [place] also not had since April according to last RX. Will have to see provider first

## 2020-04-20 NOTE — Telephone Encounter (Signed)
Pt. Called to set up appt with Merleen Nicely Just and request refill on amLODipine until they can get in for the appt.

## 2020-04-21 ENCOUNTER — Inpatient Hospital Stay (HOSPITAL_COMMUNITY)
Admission: EM | Admit: 2020-04-21 | Discharge: 2020-04-27 | DRG: 054 | Disposition: A | Discharge status: Hospice | Payer: Medicare PPO | Attending: Internal Medicine | Admitting: Internal Medicine

## 2020-04-21 ENCOUNTER — Telehealth: Payer: Self-pay | Admitting: *Deleted

## 2020-04-21 ENCOUNTER — Emergency Department (HOSPITAL_COMMUNITY): Payer: Medicare PPO

## 2020-04-21 ENCOUNTER — Other Ambulatory Visit: Payer: Self-pay

## 2020-04-21 ENCOUNTER — Encounter (HOSPITAL_COMMUNITY): Payer: Self-pay

## 2020-04-21 DIAGNOSIS — C3431 Malignant neoplasm of lower lobe, right bronchus or lung: Secondary | ICD-10-CM | POA: Diagnosis not present

## 2020-04-21 DIAGNOSIS — Z833 Family history of diabetes mellitus: Secondary | ICD-10-CM

## 2020-04-21 DIAGNOSIS — N1832 Chronic kidney disease, stage 3b: Secondary | ICD-10-CM | POA: Diagnosis present

## 2020-04-21 DIAGNOSIS — G9389 Other specified disorders of brain: Secondary | ICD-10-CM | POA: Diagnosis not present

## 2020-04-21 DIAGNOSIS — C7989 Secondary malignant neoplasm of other specified sites: Secondary | ICD-10-CM | POA: Diagnosis not present

## 2020-04-21 DIAGNOSIS — C3490 Malignant neoplasm of unspecified part of unspecified bronchus or lung: Secondary | ICD-10-CM | POA: Diagnosis not present

## 2020-04-21 DIAGNOSIS — Z923 Personal history of irradiation: Secondary | ICD-10-CM | POA: Diagnosis not present

## 2020-04-21 DIAGNOSIS — R269 Unspecified abnormalities of gait and mobility: Secondary | ICD-10-CM | POA: Diagnosis not present

## 2020-04-21 DIAGNOSIS — I160 Hypertensive urgency: Secondary | ICD-10-CM | POA: Diagnosis not present

## 2020-04-21 DIAGNOSIS — M1990 Unspecified osteoarthritis, unspecified site: Secondary | ICD-10-CM | POA: Diagnosis not present

## 2020-04-21 DIAGNOSIS — C7931 Secondary malignant neoplasm of brain: Principal | ICD-10-CM | POA: Diagnosis present

## 2020-04-21 DIAGNOSIS — Z801 Family history of malignant neoplasm of trachea, bronchus and lung: Secondary | ICD-10-CM | POA: Diagnosis not present

## 2020-04-21 DIAGNOSIS — R251 Tremor, unspecified: Secondary | ICD-10-CM | POA: Diagnosis not present

## 2020-04-21 DIAGNOSIS — G930 Cerebral cysts: Secondary | ICD-10-CM | POA: Diagnosis not present

## 2020-04-21 DIAGNOSIS — R627 Adult failure to thrive: Secondary | ICD-10-CM | POA: Diagnosis present

## 2020-04-21 DIAGNOSIS — G8929 Other chronic pain: Secondary | ICD-10-CM | POA: Diagnosis present

## 2020-04-21 DIAGNOSIS — Z9221 Personal history of antineoplastic chemotherapy: Secondary | ICD-10-CM | POA: Diagnosis not present

## 2020-04-21 DIAGNOSIS — Z7952 Long term (current) use of systemic steroids: Secondary | ICD-10-CM

## 2020-04-21 DIAGNOSIS — E785 Hyperlipidemia, unspecified: Secondary | ICD-10-CM | POA: Diagnosis not present

## 2020-04-21 DIAGNOSIS — R5381 Other malaise: Secondary | ICD-10-CM | POA: Diagnosis not present

## 2020-04-21 DIAGNOSIS — Z20822 Contact with and (suspected) exposure to covid-19: Secondary | ICD-10-CM | POA: Diagnosis present

## 2020-04-21 DIAGNOSIS — G9341 Metabolic encephalopathy: Secondary | ICD-10-CM | POA: Diagnosis not present

## 2020-04-21 DIAGNOSIS — R410 Disorientation, unspecified: Secondary | ICD-10-CM | POA: Diagnosis not present

## 2020-04-21 DIAGNOSIS — Z87891 Personal history of nicotine dependence: Secondary | ICD-10-CM | POA: Diagnosis not present

## 2020-04-21 DIAGNOSIS — I10 Essential (primary) hypertension: Secondary | ICD-10-CM | POA: Diagnosis not present

## 2020-04-21 DIAGNOSIS — C799 Secondary malignant neoplasm of unspecified site: Secondary | ICD-10-CM

## 2020-04-21 DIAGNOSIS — G936 Cerebral edema: Secondary | ICD-10-CM | POA: Diagnosis not present

## 2020-04-21 DIAGNOSIS — Z7401 Bed confinement status: Secondary | ICD-10-CM | POA: Diagnosis not present

## 2020-04-21 DIAGNOSIS — Z66 Do not resuscitate: Secondary | ICD-10-CM | POA: Diagnosis present

## 2020-04-21 DIAGNOSIS — R531 Weakness: Secondary | ICD-10-CM | POA: Diagnosis not present

## 2020-04-21 DIAGNOSIS — Z79899 Other long term (current) drug therapy: Secondary | ICD-10-CM | POA: Diagnosis not present

## 2020-04-21 DIAGNOSIS — Z8673 Personal history of transient ischemic attack (TIA), and cerebral infarction without residual deficits: Secondary | ICD-10-CM

## 2020-04-21 DIAGNOSIS — D6959 Other secondary thrombocytopenia: Secondary | ICD-10-CM | POA: Diagnosis present

## 2020-04-21 DIAGNOSIS — I129 Hypertensive chronic kidney disease with stage 1 through stage 4 chronic kidney disease, or unspecified chronic kidney disease: Secondary | ICD-10-CM | POA: Diagnosis present

## 2020-04-21 DIAGNOSIS — M2550 Pain in unspecified joint: Secondary | ICD-10-CM | POA: Diagnosis not present

## 2020-04-21 DIAGNOSIS — Z6832 Body mass index (BMI) 32.0-32.9, adult: Secondary | ICD-10-CM

## 2020-04-21 DIAGNOSIS — R339 Retention of urine, unspecified: Secondary | ICD-10-CM | POA: Diagnosis present

## 2020-04-21 DIAGNOSIS — X58XXXA Exposure to other specified factors, initial encounter: Secondary | ICD-10-CM | POA: Diagnosis present

## 2020-04-21 DIAGNOSIS — Z515 Encounter for palliative care: Secondary | ICD-10-CM | POA: Diagnosis not present

## 2020-04-21 DIAGNOSIS — G9349 Other encephalopathy: Secondary | ICD-10-CM | POA: Diagnosis not present

## 2020-04-21 DIAGNOSIS — N179 Acute kidney failure, unspecified: Secondary | ICD-10-CM | POA: Diagnosis present

## 2020-04-21 DIAGNOSIS — Z7189 Other specified counseling: Secondary | ICD-10-CM | POA: Diagnosis not present

## 2020-04-21 DIAGNOSIS — T66XXXA Radiation sickness, unspecified, initial encounter: Secondary | ICD-10-CM | POA: Diagnosis present

## 2020-04-21 DIAGNOSIS — R5383 Other fatigue: Secondary | ICD-10-CM | POA: Diagnosis not present

## 2020-04-21 DIAGNOSIS — G259 Extrapyramidal and movement disorder, unspecified: Secondary | ICD-10-CM | POA: Diagnosis present

## 2020-04-21 DIAGNOSIS — E869 Volume depletion, unspecified: Secondary | ICD-10-CM | POA: Diagnosis present

## 2020-04-21 DIAGNOSIS — H748X3 Other specified disorders of middle ear and mastoid, bilateral: Secondary | ICD-10-CM | POA: Diagnosis not present

## 2020-04-21 DIAGNOSIS — N1831 Chronic kidney disease, stage 3a: Secondary | ICD-10-CM | POA: Diagnosis not present

## 2020-04-21 DIAGNOSIS — Z7901 Long term (current) use of anticoagulants: Secondary | ICD-10-CM

## 2020-04-21 DIAGNOSIS — Z8249 Family history of ischemic heart disease and other diseases of the circulatory system: Secondary | ICD-10-CM

## 2020-04-21 DIAGNOSIS — N183 Chronic kidney disease, stage 3 unspecified: Secondary | ICD-10-CM | POA: Diagnosis present

## 2020-04-21 LAB — COMPREHENSIVE METABOLIC PANEL
ALT: 23 U/L (ref 0–44)
AST: 22 U/L (ref 15–41)
Albumin: 4 g/dL (ref 3.5–5.0)
Alkaline Phosphatase: 50 U/L (ref 38–126)
Anion gap: 14 (ref 5–15)
BUN: 35 mg/dL — ABNORMAL HIGH (ref 8–23)
CO2: 24 mmol/L (ref 22–32)
Calcium: 10.1 mg/dL (ref 8.9–10.3)
Chloride: 99 mmol/L (ref 98–111)
Creatinine, Ser: 1.96 mg/dL — ABNORMAL HIGH (ref 0.44–1.00)
GFR calc non Af Amer: 26 mL/min — ABNORMAL LOW (ref >60)
Glucose, Bld: 121 mg/dL — ABNORMAL HIGH (ref 70–99)
Potassium: 3.8 mmol/L (ref 3.5–5.1)
Sodium: 137 mmol/L (ref 135–145)
Total Bilirubin: 0.9 mg/dL (ref 0.3–1.2)
Total Protein: 7.5 g/dL (ref 6.5–8.1)

## 2020-04-21 LAB — CBC WITH DIFFERENTIAL/PLATELET
Abs Immature Granulocytes: 0.03 10*3/uL (ref 0.00–0.07)
Basophils Absolute: 0 10*3/uL (ref 0.0–0.1)
Basophils Relative: 0 %
Eosinophils Absolute: 0 10*3/uL (ref 0.0–0.5)
Eosinophils Relative: 0 %
HCT: 40.3 % (ref 36.0–46.0)
Hemoglobin: 13.1 g/dL (ref 12.0–15.0)
Immature Granulocytes: 0 %
Lymphocytes Relative: 3 %
Lymphs Abs: 0.2 10*3/uL — ABNORMAL LOW (ref 0.7–4.0)
MCH: 35.6 pg — ABNORMAL HIGH (ref 26.0–34.0)
MCHC: 32.5 g/dL (ref 30.0–36.0)
MCV: 109.5 fL — ABNORMAL HIGH (ref 80.0–100.0)
Monocytes Absolute: 0.3 10*3/uL (ref 0.1–1.0)
Monocytes Relative: 3 %
Neutro Abs: 7.3 10*3/uL (ref 1.7–7.7)
Neutrophils Relative %: 94 %
Platelets: 145 10*3/uL — ABNORMAL LOW (ref 150–400)
RBC: 3.68 MIL/uL — ABNORMAL LOW (ref 3.87–5.11)
RDW: 16.1 % — ABNORMAL HIGH (ref 11.5–15.5)
WBC: 7.7 10*3/uL (ref 4.0–10.5)
nRBC: 0 % (ref 0.0–0.2)

## 2020-04-21 LAB — CBG MONITORING, ED: Glucose-Capillary: 129 mg/dL — ABNORMAL HIGH (ref 70–99)

## 2020-04-21 LAB — RESPIRATORY PANEL BY RT PCR (FLU A&B, COVID)
Influenza A by PCR: NEGATIVE
Influenza B by PCR: NEGATIVE
SARS Coronavirus 2 by RT PCR: NEGATIVE

## 2020-04-21 LAB — MAGNESIUM: Magnesium: 1.8 mg/dL (ref 1.7–2.4)

## 2020-04-21 MED ORDER — FENTANYL CITRATE (PF) 100 MCG/2ML IJ SOLN
50.0000 ug | Freq: Once | INTRAMUSCULAR | Status: AC
  Administered 2020-04-21: 50 ug via INTRAVENOUS

## 2020-04-21 MED ORDER — LABETALOL HCL 5 MG/ML IV SOLN
10.0000 mg | Freq: Once | INTRAVENOUS | Status: AC
  Administered 2020-04-21: 10 mg via INTRAVENOUS
  Filled 2020-04-21: qty 4

## 2020-04-21 MED ORDER — PROCHLORPERAZINE MALEATE 10 MG PO TABS: 10.0000 mg | ORAL_TABLET | Freq: Four times a day (QID) | ORAL | Status: DC | PRN

## 2020-04-21 MED ORDER — RIVAROXABAN 20 MG PO TABS
20.0000 mg | ORAL_TABLET | Freq: Every day | ORAL | Status: DC
  Administered 2020-04-21 – 2020-04-27 (×6): 20 mg via ORAL
  Filled 2020-04-21 (×7): qty 1

## 2020-04-21 MED ORDER — ACETAMINOPHEN 650 MG RE SUPP
650.0000 mg | Freq: Four times a day (QID) | RECTAL | Status: DC | PRN
  Administered 2020-04-26: 650 mg via RECTAL
  Filled 2020-04-21: qty 1

## 2020-04-21 MED ORDER — CLOBETASOL PROPIONATE 0.05 % EX CREA
1.0000 "application " | TOPICAL_CREAM | Freq: Two times a day (BID) | CUTANEOUS | Status: DC | PRN
  Filled 2020-04-21: qty 15

## 2020-04-21 MED ORDER — ONDANSETRON HCL 4 MG/2ML IJ SOLN
4.0000 mg | Freq: Four times a day (QID) | INTRAMUSCULAR | Status: DC | PRN
  Filled 2020-04-21: qty 2

## 2020-04-21 MED ORDER — LACTATED RINGERS IV SOLN: INTRAVENOUS | Status: DC

## 2020-04-21 MED ORDER — DEXAMETHASONE 4 MG PO TABS
4.0000 mg | ORAL_TABLET | Freq: Two times a day (BID) | ORAL | Status: DC
  Administered 2020-04-21 – 2020-04-22 (×2): 4 mg via ORAL
  Filled 2020-04-21 (×2): qty 1

## 2020-04-21 MED ORDER — AMLODIPINE BESYLATE 10 MG PO TABS
10.0000 mg | ORAL_TABLET | Freq: Every day | ORAL | Status: DC
  Administered 2020-04-21 – 2020-04-27 (×6): 10 mg via ORAL
  Filled 2020-04-21 (×3): qty 1
  Filled 2020-04-21 (×2): qty 2
  Filled 2020-04-21: qty 1

## 2020-04-21 MED ORDER — ATORVASTATIN CALCIUM 40 MG PO TABS
80.0000 mg | ORAL_TABLET | Freq: Every day | ORAL | Status: DC
  Administered 2020-04-21 – 2020-04-25 (×5): 80 mg via ORAL
  Filled 2020-04-21 (×5): qty 2

## 2020-04-21 MED ORDER — LACTATED RINGERS IV BOLUS
500.0000 mL | Freq: Once | INTRAVENOUS | Status: AC
  Administered 2020-04-21: 500 mL via INTRAVENOUS

## 2020-04-21 MED ORDER — DULOXETINE HCL 30 MG PO CPEP
30.0000 mg | ORAL_CAPSULE | Freq: Every day | ORAL | Status: DC
  Administered 2020-04-21 – 2020-04-25 (×3): 30 mg via ORAL
  Filled 2020-04-21 (×5): qty 1

## 2020-04-21 MED ORDER — TRAMADOL HCL 50 MG PO TABS
50.0000 mg | ORAL_TABLET | Freq: Every evening | ORAL | Status: DC | PRN
  Administered 2020-04-26: 50 mg via ORAL
  Filled 2020-04-21: qty 1

## 2020-04-21 MED ORDER — ADULT MULTIVITAMIN W/MINERALS CH
1.0000 | ORAL_TABLET | Freq: Every day | ORAL | Status: DC
  Administered 2020-04-21 – 2020-04-25 (×3): 1 via ORAL
  Filled 2020-04-21 (×5): qty 1

## 2020-04-21 MED ORDER — ACETAMINOPHEN 325 MG PO TABS
650.0000 mg | ORAL_TABLET | Freq: Four times a day (QID) | ORAL | Status: DC | PRN
  Administered 2020-04-23 – 2020-04-24 (×2): 650 mg via ORAL
  Filled 2020-04-21 (×3): qty 2

## 2020-04-21 MED ORDER — LABETALOL HCL 5 MG/ML IV SOLN
0.5000 mg/min | Status: DC
  Administered 2020-04-22: 0.5 mg/min via INTRAVENOUS
  Filled 2020-04-21 (×3): qty 80

## 2020-04-21 MED ORDER — ONDANSETRON HCL 4 MG PO TABS: 4.0000 mg | ORAL_TABLET | Freq: Four times a day (QID) | ORAL | Status: DC | PRN

## 2020-04-21 MED ORDER — PREGABALIN 50 MG PO CAPS
50.0000 mg | ORAL_CAPSULE | Freq: Every day | ORAL | Status: DC
  Administered 2020-04-21 – 2020-04-25 (×3): 50 mg via ORAL
  Filled 2020-04-21 (×5): qty 1

## 2020-04-21 MED ORDER — RIVAROXABAN 20 MG PO TABS: 20.0000 mg | ORAL_TABLET | Freq: Every day | ORAL | Status: DC

## 2020-04-21 MED ORDER — POTASSIUM CHLORIDE CRYS ER 20 MEQ PO TBCR
20.0000 meq | EXTENDED_RELEASE_TABLET | Freq: Every day | ORAL | Status: DC
  Administered 2020-04-21 – 2020-04-25 (×4): 20 meq via ORAL
  Filled 2020-04-21 (×5): qty 1

## 2020-04-21 NOTE — Telephone Encounter (Signed)
Spoke with husband, he is going to take her to the [facility] ED.

## 2020-04-21 NOTE — Telephone Encounter (Signed)
He will need to discuss with Dr. Mickeal Skinner again and if she has any worsening of her condition he may need to take her to the emergency department at [facility] long.

## 2020-04-21 NOTE — H&P (Signed)
History and Physical   [name] HER:[id number] DOB: April 29, 1951 DOA: 04/21/2020  Referring MD/NP/PA: Dr. Alvino Chapel  PCP: Rutherford Guys, MD   Outpatient Specialists: Dr. Lorna Few  Patient coming from: Home  Chief Complaint: Weakness and altered mental status  HPI: [name] is a 69 y.o. female with medical history significant of metastatic small cell lung cancer to the brain, history of CVA, chronic kidney disease stage III, essential hypertension, recurrent GI bleed, who is being assessed for probably recurrent brain metastasis and MRI was being scheduled this week.  Patient has been progressively lethargic weak at home.  Husband brought patient in due to these symptoms.  She is still awake and able to communicate.  She was seen in the ER and evaluated.  Patient is chronically ill looking with her Port-A-Cath in place.  She has history of hypertension has been taking her medicine however her systolic blood pressure was found to be above 856 and diastolic up to 314.  She is having persistent headaches.  No focal weakness.  Patient at this point being admitted with hypertensive urgency in the setting of her history of possible metastatic cancer to the brain.  She had history of remote CVA but has recovered mostly..  ED Course: Temperature 97.9 blood pressure 228/130 pulse 89 respirate of 18 oxygen sat 98% on room air.  Chemistry shows a creatinine of 1.96 with BUN 35 and glucose 121.  The rest appeared to be within normal.  CBC is entirely normal except for platelets 145.  COVID-19 and respiratory panel all negative.  TSH 0.723.  Head CT without contrast shows progressive decrease in size of cystic and solid intracranial masses.  Also changes of radiation therapy.  There was interval development of left mastoid opacification possible mastoiditis.  Patient being admitted for evaluation and treatment.  Review of Systems: As per HPI otherwise 10 point review of systems  negative.    Past Medical History:  Diagnosis Date  . Arthritis   . Chronic pain   . Hyperlipidemia   . Hypertension   . Low blood magnesium 10/04/2018  . lung ca dx'd 06/2018  . Stroke ([place])    06/19/2018 minor    Past Surgical History:  Procedure Laterality Date  . BIOPSY  06/19/2018   Procedure: BIOPSY;  Surgeon: Laurence Spates, MD;  Location: [facility];  Service: Endoscopy;;  . BIOPSY  10/11/2018   Procedure: BIOPSY;  Surgeon: Laurence Spates, MD;  Location: [facility];  Service: Endoscopy;;  . ENDARTERECTOMY Right 06/26/2018   Procedure: ENDARTERECTOMY CAROTID RIGHT;  Surgeon: Serafina Mitchell, MD;  Location: [facility] OR;  Service: Vascular;  Laterality: Right;  . ESOPHAGOGASTRODUODENOSCOPY (EGD) WITH PROPOFOL N/A 06/19/2018   Procedure: ESOPHAGOGASTRODUODENOSCOPY (EGD) WITH PROPOFOL;  Surgeon: Laurence Spates, MD;  Location: [facility];  Service: Endoscopy;  Laterality: N/A;  . FLEXIBLE SIGMOIDOSCOPY N/A 10/11/2018   Procedure: FLEXIBLE SIGMOIDOSCOPY;  Surgeon: Laurence Spates, MD;  Location: [facility];  Service: Endoscopy;  Laterality: N/A;  . IR IMAGING GUIDED PORT INSERTION  10/07/2018  . PATCH ANGIOPLASTY Right 06/26/2018   Procedure: PATCH ANGIOPLASTY USING A XENOSURE 1cm x 6cm BIOLOGIC PATCH;  Surgeon: Serafina Mitchell, MD;  Location: [place];  Service: Vascular;  Laterality: Right;  Marland Kitchen VIDEO BRONCHOSCOPY WITH ENDOBRONCHIAL ULTRASOUND N/A 08/14/2018   Procedure: VIDEO BRONCHOSCOPY WITH ENDOBRONCHIAL ULTRASOUND;  Surgeon: Collene Gobble, MD;  Location: [place];  Service: Thoracic;  Laterality: N/A;     reports that she quit smoking about 22 months  ago. Her smoking use included cigarettes. She has a 10.00 pack-year smoking history. She has never used smokeless tobacco. She reports current alcohol use. She reports that she does not use drugs.  No Known Allergies  Family History  Problem Relation Age of Onset  . Cancer Mother        lung cancer  . Diabetes Other   .  Hyperlipidemia Other   . Hypertension Other   . Cancer Other      Prior to Admission medications   Medication Sig Start Date End Date Taking? Authorizing Provider  amLODipine (NORVASC) 10 MG tablet Take 1 tablet (10 mg total) by mouth daily as needed. Patient taking differently: Take 10 mg by mouth daily.  10/05/19  Yes Patrecia Pour, MD  atorvastatin (LIPITOR) 80 MG tablet TAKE 1 TABLET (80 MG TOTAL) BY MOUTH DAILY AT 6 PM. Patient taking differently: Take 80 mg by mouth daily.  07/24/19  Yes Rutherford Guys, MD  Clobetasol Prop Emollient Base (CLOBETASOL PROPIONATE E) 0.05 % emollient cream Apply 1 application topically 2 (two) times daily. Patient taking differently: Apply 1 application topically 2 (two) times daily as needed (itching).  09/04/19  Yes Rutherford Guys, MD  dexamethasone (DECADRON) 4 MG tablet Take 1 tablet (4 mg total) by mouth 2 (two) times daily. 04/19/20  Yes Vaslow, Acey Lav, MD  DULoxetine (CYMBALTA) 30 MG capsule TAKE 1 CAPSULE BY MOUTH EVERY DAY Patient taking differently: Take 30 mg by mouth daily.  01/18/20  Yes Rutherford Guys, MD  Multiple Vitamin (MULTIVITAMIN WITH MINERALS) TABS tablet Take 1 tablet by mouth daily.   Yes [provider]  potassium chloride SA (KLOR-CON) 20 MEQ tablet Take 1 tablet (20 mEq total) by mouth daily. 12/16/19  Yes Curt Bears, MD  pregabalin (LYRICA) 50 MG capsule TAKE 1 CAPSULE 3 TIMES A DAY Patient taking differently: Take 50 mg by mouth daily.  11/03/19  Yes Rutherford Guys, MD  prochlorperazine (COMPAZINE) 10 MG tablet Take 1 tablet (10 mg total) by mouth every 6 (six) hours as needed for nausea or vomiting. 02/17/20  Yes Heilingoetter, Cassandra L, PA-C  traMADol (ULTRAM) 50 MG tablet Take 1-2 tablets (50-100 mg total) by mouth at bedtime as needed. Patient taking differently: Take 50 mg by mouth at bedtime as needed for moderate pain.  02/27/20  Yes Rutherford Guys, MD  XARELTO 20 MG TABS tablet TAKE 1 TABLET BY MOUTH  EVERY DAY WITH SUPPER Patient taking differently: Take 20 mg by mouth daily.  03/30/20  Yes Curt Bears, MD    Physical Exam: Vitals:   04/21/20 1800 04/21/20 1815 04/21/20 1830 04/21/20 1900  BP: (!) 206/113 (!) 203/120 (!) 204/115 (!) 205/116  Pulse: 85 85 84 84  Resp: 13 16 14 14   Temp:      TempSrc:      SpO2: 100% 100% 100% 100%      Constitutional: Chronically ill looking, hairless scalp, no distress Vitals:   04/21/20 1800 04/21/20 1815 04/21/20 1830 04/21/20 1900  BP: (!) 206/113 (!) 203/120 (!) 204/115 (!) 205/116  Pulse: 85 85 84 84  Resp: 13 16 14 14   Temp:      TempSrc:      SpO2: 100% 100% 100% 100%   Eyes: PERRL, lids and conjunctivae normal ENMT: Mucous membranes are moist. Posterior pharynx clear of any exudate or lesions.Normal dentition.  Neck: normal, supple, no masses, no thyromegaly Respiratory: Port-A-Cath in place, clear to auscultation bilaterally, no  wheezing, no crackles. Normal respiratory effort. No accessory muscle use.  Cardiovascular: Regular rate and rhythm, no murmurs / rubs / gallops. No extremity edema. 2+ pedal pulses. No carotid bruits.  Abdomen: no tenderness, no masses palpated. No hepatosplenomegaly. Bowel sounds positive.  Musculoskeletal: no clubbing / cyanosis. No joint deformity upper and lower extremities. Good ROM, no contractures. Normal muscle tone.  Skin: no rashes, lesions, ulcers. No induration Neurologic: CN 2-12 grossly intact. Sensation intact, DTR normal. Strength 5/5 in all 4.  No focal weakness elicited Psychiatric: Normal judgment and insight. Alert and oriented x 3.  Depressed mood.     Labs on Admission: I have personally reviewed following labs and imaging studies  CBC: Recent Labs  Lab 04/21/20 1633  WBC 7.7  NEUTROABS 7.3  HGB 13.1  HCT 40.3  MCV 109.5*  PLT 741*   Basic Metabolic Panel: Recent Labs  Lab 04/21/20 1633  NA 137  K 3.8  CL 99  CO2 24  GLUCOSE 121*  BUN 35*  CREATININE 1.96*   CALCIUM 10.1  MG 1.8   GFR: Estimated Creatinine Clearance: 28.8 mL/min (A) (by C-G formula based on SCr of 1.96 mg/dL (H)). Liver Function Tests: Recent Labs  Lab 04/21/20 1633  AST 22  ALT 23  ALKPHOS 50  BILITOT 0.9  PROT 7.5  ALBUMIN 4.0   No results for input(s): LIPASE, AMYLASE in the last 168 hours. No results for input(s): AMMONIA in the last 168 hours. Coagulation Profile: No results for input(s): INR, PROTIME in the last 168 hours. Cardiac Enzymes: No results for input(s): CKTOTAL, CKMB, CKMBINDEX, TROPONINI in the last 168 hours. BNP (last 3 results) No results for input(s): PROBNP in the last 8760 hours. HbA1C: No results for input(s): HGBA1C in the last 72 hours. CBG: Recent Labs  Lab 04/21/20 1538  GLUCAP 129*   Lipid Profile: No results for input(s): CHOL, HDL, LDLCALC, TRIG, CHOLHDL, LDLDIRECT in the last 72 hours. Thyroid Function Tests: No results for input(s): TSH, T4TOTAL, FREET4, T3FREE, THYROIDAB in the last 72 hours. Anemia Panel: No results for input(s): VITAMINB12, FOLATE, FERRITIN, TIBC, IRON, RETICCTPCT in the last 72 hours. Urine analysis:    Component Value Date/Time   COLORURINE YELLOW 10/02/2019 1134   APPEARANCEUR CLEAR 10/02/2019 1134   LABSPEC 1.013 10/02/2019 1134   PHURINE 7.0 10/02/2019 1134   GLUCOSEU NEGATIVE 10/02/2019 1134   HGBUR NEGATIVE 10/02/2019 1134   BILIRUBINUR NEGATIVE 10/02/2019 1134   BILIRUBINUR negative 06/21/2018 1057   KETONESUR NEGATIVE 10/02/2019 1134   PROTEINUR NEGATIVE 10/02/2019 1134   UROBILINOGEN 0.2 06/21/2018 1057   NITRITE NEGATIVE 10/02/2019 1134   LEUKOCYTESUR TRACE (A) 10/02/2019 1134   Sepsis Labs: @LABRCNTIP (procalcitonin:4,lacticidven:4) )No results found for this or any previous visit (from the past 240 hour(s)).   Radiological Exams on Admission: CT Head Wo Contrast  Result Date: 04/21/2020 CLINICAL DATA:  Headache, brain tumor EXAM: CT HEAD WITHOUT CONTRAST TECHNIQUE: Contiguous  axial images were obtained from the base of the skull through the vertex without intravenous contrast. COMPARISON:  Head CT 10/02/2019, MRI 01/24/2020 FINDINGS: Brain: There has been marked interval response to therapy since prior examinations. Previously noted cystic lesion within the left frontal lobe now measures 4 mm x 8 mm at axial image # 18 (previously measuring 8 mm x 12 mm). Cystic metastasis within the right cerebellar hemisphere measures 15 mm x 19 mm at axial image # 7 (previously measuring 19 mm x 21 mm). Enhancing nodule adjacent to the right caudate head  is poorly delineated on this examination, but appears slightly decreased in size since prior MRI exam. Interval development of diffuse low attenuation of the deep white matter may reflect changes of radiation therapy. Cerebral edema could appear similarly, however, there is no significant associated mass effect or midline shift. No new intra or extra-axial mass lesion or fluid collection is identified. Parenchymal volume loss is stable since prior examination and commensurate with the patient's age. Ventricular size is normal and unchanged. No evidence of acute intracranial hemorrhage or infarct. Vascular: No asymmetric hyperdense vasculature at the skull base. Skull: Intact.  No lytic lesions. Sinuses/Orbits: The paranasal sinuses are clear. The orbits are unremarkable Other: Fluid opacification of several left mastoid air cells has developed inferiorly without associated osseous erosion. Middle ear cavities and right mastoid air cells are clear. IMPRESSION: Interval response to therapy with progressive decrease in size of cystic and solid intracranial metastases. Interval development of diffuse low attenuation of the deep cerebral white matter possibly reflecting changes of a radiation therapy in the appropriate clinical setting. Interval development of left mastoid opacification. Correlation for signs and symptoms of mastoiditis may be helpful.  Electronically Signed   By: Fidela Salisbury MD   On: 04/21/2020 19:23      Assessment/Plan Active Problems:   CKD (chronic kidney disease) stage 3, GFR 30-59 ml/min (HCC)   Hypertensive urgency   Metastatic small cell carcinoma to brain [facility])     #1  Hypertensive urgency: Patient will be admitted to stepdown unit.  Restart home regimen.  Order labetalol drip and titrate.  May add other oral medications as needed to control blood pressure.  Head CT did not show any sign of intracranial hemorrhage.  She is on chronic steroids.  #2  Metastatic small cell lung cancer to the brain: Patient has had chemo and radiation therapy.  Head CT without contrast showed improvement and reduction in size of metastasis.  Patient at this point will need evaluation by oncology to see if MRI is warranted.  If so she may be transported to [facility] as our MRI here is down tonight.  #3 chronic kidney disease stage III: Appears to be at baseline.  Continue to monitor renal function.  Avoid nephrotoxic medications.  #4 thrombocytopenia: Secondary to cancer treatment most likely.  Appears stable from a month ago.   DVT prophylaxis: Eliquis Code Status: Full code Family Communication: Husband at bedside Disposition Plan: Home Consults called: None but consult Dr. Earlie Server in the morning Admission status: Inpatient  Severity of Illness: The appropriate patient status for this patient is INPATIENT. Inpatient status is judged to be reasonable and necessary in order to provide the required intensity of service to ensure the patient's safety. The patient's presenting symptoms, physical exam findings, and initial radiographic and laboratory data in the context of their chronic comorbidities is felt to place them at high risk for further clinical deterioration. Furthermore, it is not anticipated that the patient will be medically stable for discharge from the hospital within 2 midnights of admission. The  following factors support the patient status of inpatient.   " The patient's presenting symptoms include confusion and weakness. " The worrisome physical exam findings include chronically ill looking. " The initial radiographic and laboratory data are worrisome because of mostly at baseline. " The chronic co-morbidities include metastatic lung cancer.   * I certify that at the point of admission it is my clinical judgment that the patient will require inpatient hospital care spanning beyond  2 midnights from the point of admission due to high intensity of service, high risk for further deterioration and high frequency of surveillance required.Barbette Merino MD [facility] Pager [phone]  If 7PM-7AM, please contact night-coverage [email or web address] Password [email or web address]  04/21/2020, 8:10 PM

## 2020-04-21 NOTE — ED Provider Notes (Signed)
[facility] DEPT Provider Note   CSN: [id number] Arrival date & time: 04/21/20  1455     History Chief Complaint  Patient presents with  . Altered Mental Status    [name] is a 69 y.o. female.  HPI Level 5 caveat due to altered mental status.  Has increasing confusion.  Known lung cancer metastatic to brain.  Has been not doing as well at home.  Really cannot provide too much history and history comes from family members.    Past Medical History:  Diagnosis Date  . Arthritis   . Chronic pain   . Hyperlipidemia   . Hypertension   . Low blood magnesium 10/04/2018  . lung ca dx'd 06/2018  . Stroke [facility])    06/19/2018 minor    Patient Active Problem List   Diagnosis Date Noted  . Thrombocytopenia ([place]) 11/24/2019  . Drug-induced neutropenia ([place]) 11/10/2019  . Encounter for antineoplastic immunotherapy 10/27/2019  . Metastatic small cell carcinoma to brain ([place]) 10/07/2019  . Acute encephalopathy 10/03/2019  . Hypertensive urgency 10/02/2019  . Port-A-Cath in place 04/08/2019  . CKD (chronic kidney disease) stage 3, GFR 30-59 ml/min (HCC) 03/06/2019  . Neuropathy 03/06/2019  . Megaloblastic anemia 03/06/2019  . GIB (gastrointestinal bleeding) 10/30/2018  . Symptomatic anemia 10/30/2018  . Hypotension 10/30/2018  . Hypokalemia 10/30/2018  . Acute renal failure superimposed on stage 3 chronic kidney disease ([place]) 10/30/2018  . Pancytopenia ([place]) 10/30/2018  . Nausea vomiting and diarrhea 10/30/2018  . PUD (peptic ulcer disease) 10/30/2018  . Sepsis ([place]) 10/30/2018  . Hypomagnesemia 09/24/2018  . Small cell carcinoma of lower lobe of right lung ([place]) 08/28/2018  . Encounter for antineoplastic chemotherapy 08/28/2018  . Goals of care, counseling/discussion 08/28/2018  . Right lower lobe lung mass   . Acute CVA (cerebrovascular accident) ([place]) 06/21/2018  . Acute blood loss anemia 06/21/2018  . Upper GI bleed 06/19/2018  .  Psoriasis 12/14/2017  . Knee pain, right 12/14/2017  . BMI 32.0-32.9,adult 12/15/2011  . Underinsured 12/15/2011  . HLD (hyperlipidemia) 03/26/2007  . Essential hypertension 03/26/2007    Past Surgical History:  Procedure Laterality Date  . BIOPSY  06/19/2018   Procedure: BIOPSY;  Surgeon: Laurence Spates, MD;  Location: [facility] ENDOSCOPY;  Service: Endoscopy;;  . BIOPSY  10/11/2018   Procedure: BIOPSY;  Surgeon: Laurence Spates, MD;  Location: [facility]OSCOPY;  Service: Endoscopy;;  . ENDARTERECTOMY Right 06/26/2018   Procedure: ENDARTERECTOMY CAROTID RIGHT;  Surgeon: Serafina Mitchell, MD;  Location: [facility] OR;  Service: Vascular;  Laterality: Right;  . ESOPHAGOGASTRODUODENOSCOPY (EGD) WITH PROPOFOL N/A 06/19/2018   Procedure: ESOPHAGOGASTRODUODENOSCOPY (EGD) WITH PROPOFOL;  Surgeon: Laurence Spates, MD;  Location: [facility]COPY;  Service: Endoscopy;  Laterality: N/A;  . FLEXIBLE SIGMOIDOSCOPY N/A 10/11/2018   Procedure: FLEXIBLE SIGMOIDOSCOPY;  Surgeon: Laurence Spates, MD;  Location: [facility];  Service: Endoscopy;  Laterality: N/A;  . IR IMAGING GUIDED PORT INSERTION  10/07/2018  . PATCH ANGIOPLASTY Right 06/26/2018   Procedure: PATCH ANGIOPLASTY USING A XENOSURE 1cm x 6cm BIOLOGIC PATCH;  Surgeon: Serafina Mitchell, MD;  Location: [facility];  Service: Vascular;  Laterality: Right;  Marland Kitchen VIDEO BRONCHOSCOPY WITH ENDOBRONCHIAL ULTRASOUND N/A 08/14/2018   Procedure: VIDEO BRONCHOSCOPY WITH ENDOBRONCHIAL ULTRASOUND;  Surgeon: Collene Gobble, MD;  Location: [facility] OR;  Service: Thoracic;  Laterality: N/A;     OB History   No obstetric history on file.     Family History  Problem Relation Age of Onset  . Cancer  Mother        lung cancer  . Diabetes Other   . Hyperlipidemia Other   . Hypertension Other   . Cancer Other     Social History   Tobacco Use  . Smoking status: Former Smoker    Packs/day: 1.00    Years: 10.00    Pack years: 10.00    Types: Cigarettes    Quit date: 06/19/2018    Years since  quitting: 1.8  . Smokeless tobacco: Never Used  . Tobacco comment: Currently using Nicorette Gum  Vaping Use  . Vaping Use: Never used  Substance Use Topics  . Alcohol use: Yes    Comment: wine 2 glasses a day   . Drug use: Never    Comment: Hemp Oil    Home Medications Prior to Admission medications   Medication Sig Start Date End Date Taking? Authorizing Provider  amLODipine (NORVASC) 10 MG tablet Take 1 tablet (10 mg total) by mouth daily as needed. Patient taking differently: Take 10 mg by mouth daily.  10/05/19  Yes Patrecia Pour, MD  atorvastatin (LIPITOR) 80 MG tablet TAKE 1 TABLET (80 MG TOTAL) BY MOUTH DAILY AT 6 PM. Patient taking differently: Take 80 mg by mouth daily.  07/24/19  Yes Rutherford Guys, MD  Clobetasol Prop Emollient Base (CLOBETASOL PROPIONATE E) 0.05 % emollient cream Apply 1 application topically 2 (two) times daily. Patient taking differently: Apply 1 application topically 2 (two) times daily as needed (itching).  09/04/19  Yes Rutherford Guys, MD  dexamethasone (DECADRON) 4 MG tablet Take 1 tablet (4 mg total) by mouth 2 (two) times daily. 04/19/20  Yes Vaslow, Acey Lav, MD  DULoxetine (CYMBALTA) 30 MG capsule TAKE 1 CAPSULE BY MOUTH EVERY DAY Patient taking differently: Take 30 mg by mouth daily.  01/18/20  Yes Rutherford Guys, MD  Multiple Vitamin (MULTIVITAMIN WITH MINERALS) TABS tablet Take 1 tablet by mouth daily.   Yes [provider]  potassium chloride SA (KLOR-CON) 20 MEQ tablet Take 1 tablet (20 mEq total) by mouth daily. 12/16/19  Yes Curt Bears, MD  pregabalin (LYRICA) 50 MG capsule TAKE 1 CAPSULE 3 TIMES A DAY Patient taking differently: Take 50 mg by mouth daily.  11/03/19  Yes Rutherford Guys, MD  prochlorperazine (COMPAZINE) 10 MG tablet Take 1 tablet (10 mg total) by mouth every 6 (six) hours as needed for nausea or vomiting. 02/17/20  Yes Heilingoetter, Cassandra L, PA-C  traMADol (ULTRAM) 50 MG tablet Take 1-2 tablets (50-100 mg  total) by mouth at bedtime as needed. Patient taking differently: Take 50 mg by mouth at bedtime as needed for moderate pain.  02/27/20  Yes Rutherford Guys, MD  XARELTO 20 MG TABS tablet TAKE 1 TABLET BY MOUTH EVERY DAY WITH SUPPER Patient taking differently: Take 20 mg by mouth daily.  03/30/20  Yes Curt Bears, MD    Allergies    Patient has no known allergies.  Review of Systems   Review of Systems  Unable to perform ROS: Mental status change    Physical Exam Updated Vital Signs BP (!) 205/116   Pulse 84   Temp 97.9 F (36.6 C) (Oral)   Resp 14   SpO2 100%   Physical Exam Vitals and nursing note reviewed.  HENT:     Head: Atraumatic.  Eyes:     Pupils: Pupils are equal, round, and reactive to light.  Cardiovascular:     Rate and Rhythm: Regular rhythm.  Pulmonary:     Breath sounds: No wheezing or rhonchi.  Abdominal:     Tenderness: There is no abdominal tenderness.  Musculoskeletal:     Cervical back: Neck supple.  Skin:    General: Skin is warm.     Capillary Refill: Capillary refill takes less than 2 seconds.  Neurological:     Mental Status: She is alert.     Comments: Awake and confused.  Answers questions but really not able to provide much history.  Does move all extremities     ED Results / Procedures / Treatments   Labs (all labs ordered are listed, but only abnormal results are displayed) Labs Reviewed  COMPREHENSIVE METABOLIC PANEL - Abnormal; Notable for the following components:      Result Value   Glucose, Bld 121 (*)    BUN 35 (*)    Creatinine, Ser 1.96 (*)    GFR calc non Af Amer 26 (*)    All other components within normal limits  CBC WITH DIFFERENTIAL/PLATELET - Abnormal; Notable for the following components:   RBC 3.68 (*)    MCV 109.5 (*)    MCH 35.6 (*)    RDW 16.1 (*)    Platelets 145 (*)    Lymphs Abs 0.2 (*)    All other components within normal limits  CBG MONITORING, ED - Abnormal; Notable for the following  components:   Glucose-Capillary 129 (*)    All other components within normal limits  MAGNESIUM  URINALYSIS, ROUTINE W REFLEX MICROSCOPIC    EKG None  Radiology CT Head Wo Contrast  Result Date: 04/21/2020 CLINICAL DATA:  Headache, brain tumor EXAM: CT HEAD WITHOUT CONTRAST TECHNIQUE: Contiguous axial images were obtained from the base of the skull through the vertex without intravenous contrast. COMPARISON:  Head CT 10/02/2019, MRI 01/24/2020 FINDINGS: Brain: There has been marked interval response to therapy since prior examinations. Previously noted cystic lesion within the left frontal lobe now measures 4 mm x 8 mm at axial image # 18 (previously measuring 8 mm x 12 mm). Cystic metastasis within the right cerebellar hemisphere measures 15 mm x 19 mm at axial image # 7 (previously measuring 19 mm x 21 mm). Enhancing nodule adjacent to the right caudate head is poorly delineated on this examination, but appears slightly decreased in size since prior MRI exam. Interval development of diffuse low attenuation of the deep white matter may reflect changes of radiation therapy. Cerebral edema could appear similarly, however, there is no significant associated mass effect or midline shift. No new intra or extra-axial mass lesion or fluid collection is identified. Parenchymal volume loss is stable since prior examination and commensurate with the patient's age. Ventricular size is normal and unchanged. No evidence of acute intracranial hemorrhage or infarct. Vascular: No asymmetric hyperdense vasculature at the skull base. Skull: Intact.  No lytic lesions. Sinuses/Orbits: The paranasal sinuses are clear. The orbits are unremarkable Other: Fluid opacification of several left mastoid air cells has developed inferiorly without associated osseous erosion. Middle ear cavities and right mastoid air cells are clear. IMPRESSION: Interval response to therapy with progressive decrease in size of cystic and solid  intracranial metastases. Interval development of diffuse low attenuation of the deep cerebral white matter possibly reflecting changes of a radiation therapy in the appropriate clinical setting. Interval development of left mastoid opacification. Correlation for signs and symptoms of mastoiditis may be helpful. Electronically Signed   By: Fidela Salisbury MD   On: 04/21/2020 19:23  Procedures Procedures (including critical care time)  Medications Ordered in ED Medications  lactated ringers bolus 500 mL (has no administration in time range)  fentaNYL (SUBLIMAZE) injection 50 mcg (50 mcg Intravenous Given 04/21/20 1906)  labetalol (NORMODYNE) injection 10 mg (10 mg Intravenous Given 04/21/20 1905)    ED Course  I have reviewed the triage vital signs and the nursing notes.  Pertinent labs & imaging results that were available during my care of the patient were reviewed by me and considered in my medical decision making (see chart for details).    MDM Rules/Calculators/A&P                          Patient presents with generalized weakness.  Has been increasing over around the last week.  Known metastatic disease to brain.  Now having difficulty walking.  Had planned for MRI in 2 days.  However is worse and does not managing well at home.  Unfortunate the MRI here at [facility] is nonfunctional at this time.  Discussed with Dr. Mickeal Skinner, the patient's neuro oncologist.  With decreased oral intake fatigue new weakness and now increased creatinine will admit to hospital.  CT scan done here due to hypertension and headache and being on anticoagulation.  It overall is reassuring.  However will require admission to the hospital for further work-up.  Given IV labetalol to help control the blood pressure.  Also given fentanyl to help with a headache.  Will discuss with hospitalist. Final Clinical Impression(s) / ED Diagnoses Final diagnoses:  Weakness  Metastatic malignant neoplasm, unspecified site  [facility])  Hypertension, unspecified type    Rx / DC Orders ED Discharge Orders    None       Davonna Belling, MD 04/21/20 1942

## 2020-04-21 NOTE — Telephone Encounter (Signed)
Dominica Severin states Josefa is continuing to deteriorate. Had spoken with Dr Mickeal Skinner on Monday and he increased Decadron to BID and moved scan sooner. Marlenne cannot walk, does not talk well and is not interacting much, has a headache. BP this morning was 212/112.  Was high last night as well, but came down to 127/88 after taking amlodipine. Is very concerned about her and wants to know what to do.

## 2020-04-21 NOTE — ED Notes (Addendum)
Spoke with family, per family pt CA has metastasized to brain, pt has had increasing confusion and inability to ambulate worsening over the week.    Spoke with pt [name], [phone]

## 2020-04-21 NOTE — ED Triage Notes (Signed)
Pt alert and oriented in triage, unable to verbalize why she is here. Pt word finding. Able to eventually state she came here to make sure she "isnt having a stroke", pt unable to verbalize why or what sx she was having. Attempted to get in contact with family.

## 2020-04-22 DIAGNOSIS — R5383 Other fatigue: Secondary | ICD-10-CM | POA: Diagnosis not present

## 2020-04-22 DIAGNOSIS — G9349 Other encephalopathy: Secondary | ICD-10-CM | POA: Diagnosis not present

## 2020-04-22 DIAGNOSIS — C3431 Malignant neoplasm of lower lobe, right bronchus or lung: Secondary | ICD-10-CM

## 2020-04-22 DIAGNOSIS — N1831 Chronic kidney disease, stage 3a: Secondary | ICD-10-CM | POA: Diagnosis not present

## 2020-04-22 DIAGNOSIS — C7931 Secondary malignant neoplasm of brain: Principal | ICD-10-CM

## 2020-04-22 DIAGNOSIS — C799 Secondary malignant neoplasm of unspecified site: Secondary | ICD-10-CM | POA: Diagnosis present

## 2020-04-22 DIAGNOSIS — Z923 Personal history of irradiation: Secondary | ICD-10-CM

## 2020-04-22 DIAGNOSIS — I160 Hypertensive urgency: Secondary | ICD-10-CM

## 2020-04-22 DIAGNOSIS — R251 Tremor, unspecified: Secondary | ICD-10-CM

## 2020-04-22 DIAGNOSIS — Z8673 Personal history of transient ischemic attack (TIA), and cerebral infarction without residual deficits: Secondary | ICD-10-CM

## 2020-04-22 DIAGNOSIS — M1990 Unspecified osteoarthritis, unspecified site: Secondary | ICD-10-CM

## 2020-04-22 DIAGNOSIS — I10 Essential (primary) hypertension: Secondary | ICD-10-CM

## 2020-04-22 DIAGNOSIS — R410 Disorientation, unspecified: Secondary | ICD-10-CM

## 2020-04-22 DIAGNOSIS — E785 Hyperlipidemia, unspecified: Secondary | ICD-10-CM

## 2020-04-22 DIAGNOSIS — Z87891 Personal history of nicotine dependence: Secondary | ICD-10-CM

## 2020-04-22 DIAGNOSIS — R269 Unspecified abnormalities of gait and mobility: Secondary | ICD-10-CM

## 2020-04-22 DIAGNOSIS — Z79899 Other long term (current) drug therapy: Secondary | ICD-10-CM

## 2020-04-22 LAB — COMPREHENSIVE METABOLIC PANEL
ALT: 18 U/L (ref 0–44)
AST: 17 U/L (ref 15–41)
Albumin: 3.2 g/dL — ABNORMAL LOW (ref 3.5–5.0)
Alkaline Phosphatase: 38 U/L (ref 38–126)
Anion gap: 8 (ref 5–15)
BUN: 36 mg/dL — ABNORMAL HIGH (ref 8–23)
CO2: 24 mmol/L (ref 22–32)
Calcium: 8.9 mg/dL (ref 8.9–10.3)
Chloride: 104 mmol/L (ref 98–111)
Creatinine, Ser: 1.75 mg/dL — ABNORMAL HIGH (ref 0.44–1.00)
GFR calc non Af Amer: 30 mL/min — ABNORMAL LOW (ref >60)
Glucose, Bld: 152 mg/dL — ABNORMAL HIGH (ref 70–99)
Potassium: 4.2 mmol/L (ref 3.5–5.1)
Sodium: 136 mmol/L (ref 135–145)
Total Bilirubin: 0.6 mg/dL (ref 0.3–1.2)
Total Protein: 5.6 g/dL — ABNORMAL LOW (ref 6.5–8.1)

## 2020-04-22 LAB — URINALYSIS, ROUTINE W REFLEX MICROSCOPIC
Bilirubin Urine: NEGATIVE
Glucose, UA: NEGATIVE mg/dL
Hgb urine dipstick: NEGATIVE
Ketones, ur: NEGATIVE mg/dL
Leukocytes,Ua: NEGATIVE
Nitrite: NEGATIVE
Protein, ur: NEGATIVE mg/dL
Specific Gravity, Urine: 1.015 (ref 1.005–1.030)
pH: 5 (ref 5.0–8.0)

## 2020-04-22 LAB — CBC
HCT: 32.1 % — ABNORMAL LOW (ref 36.0–46.0)
Hemoglobin: 10.5 g/dL — ABNORMAL LOW (ref 12.0–15.0)
MCH: 35.5 pg — ABNORMAL HIGH (ref 26.0–34.0)
MCHC: 32.7 g/dL (ref 30.0–36.0)
MCV: 108.4 fL — ABNORMAL HIGH (ref 80.0–100.0)
Platelets: 118 10*3/uL — ABNORMAL LOW (ref 150–400)
RBC: 2.96 MIL/uL — ABNORMAL LOW (ref 3.87–5.11)
RDW: 16 % — ABNORMAL HIGH (ref 11.5–15.5)
WBC: 6.7 10*3/uL (ref 4.0–10.5)
nRBC: 0 % (ref 0.0–0.2)

## 2020-04-22 MED ORDER — CHLORHEXIDINE GLUCONATE CLOTH 2 % EX PADS
6.0000 | MEDICATED_PAD | Freq: Every day | CUTANEOUS | Status: DC
  Administered 2020-04-23 – 2020-04-27 (×5): 6 via TOPICAL

## 2020-04-22 MED ORDER — AMANTADINE HCL 100 MG PO CAPS
100.0000 mg | ORAL_CAPSULE | Freq: Two times a day (BID) | ORAL | Status: DC
  Administered 2020-04-22 – 2020-04-27 (×9): 100 mg via ORAL
  Filled 2020-04-22 (×11): qty 1

## 2020-04-22 MED ORDER — DEXAMETHASONE 4 MG PO TABS
4.0000 mg | ORAL_TABLET | Freq: Every day | ORAL | Status: DC
  Administered 2020-04-23 – 2020-04-25 (×3): 4 mg via ORAL
  Filled 2020-04-22 (×3): qty 1

## 2020-04-22 MED ORDER — VITAMIN E 180 MG (400 UNIT) PO CAPS
400.0000 [IU] | ORAL_CAPSULE | Freq: Every day | ORAL | Status: DC
  Administered 2020-04-22 – 2020-04-24 (×3): 400 [IU] via ORAL
  Filled 2020-04-22 (×6): qty 1

## 2020-04-22 MED ORDER — VITAMIN E 180 MG (400 UNIT) PO CAPS
400.0000 [IU] | ORAL_CAPSULE | Freq: Every day | ORAL | Status: DC
  Filled 2020-04-22: qty 1

## 2020-04-22 MED ORDER — PENTOXIFYLLINE ER 400 MG PO TBCR
400.0000 mg | EXTENDED_RELEASE_TABLET | Freq: Two times a day (BID) | ORAL | Status: DC
  Administered 2020-04-22 – 2020-04-25 (×7): 400 mg via ORAL
  Filled 2020-04-22 (×11): qty 1

## 2020-04-22 MED ORDER — HYDRALAZINE HCL 10 MG PO TABS
10.0000 mg | ORAL_TABLET | Freq: Four times a day (QID) | ORAL | Status: DC | PRN
  Administered 2020-04-24 – 2020-04-25 (×2): 10 mg via ORAL
  Filled 2020-04-22 (×2): qty 1

## 2020-04-22 NOTE — Progress Notes (Signed)
PROGRESS NOTE  LEANORA Hobbs WCB:[id number] DOB: [birth year] DOA: 04/21/2020 PCP: Rutherford Guys, MD   LOS: 1 day   Brief narrative: As per HPI,  [name] is a 69 y.o. female with medical history significant of metastatic small cell lung cancer to the brain, history of CVA, chronic kidney disease stage III, essential hypertension, recurrent GI bleed, presented to hospital with progressive lethargy, weakness at home.  Husband brought patient in due to these symptoms.   She has history of hypertension and was  taking her medicine however her systolic blood pressure was found to be above 616 and diastolic up to 073.  She is having persistent headaches.  No focal weakness.  Patient was then admitted with hypertensive urgency in the setting of her history of possible metastatic cancer to the brain.  She had history of remote CVA but has recovered mostly.  In the ED, temperature 97.9 blood pressure 228/130 pulse 89 respirate of 18 oxygen sat 98% on room air.  Chemistry shows a creatinine of 1.96 with BUN 35 and glucose 121.  CBCwith platelets 145.  COVID-19 and respiratory panel all negative.  TSH 0.723.  Head CT without contrast shows progressive decrease in size of cystic and solid intracranial masses.  Also changes of radiation therapy.  There was interval development of left mastoid opacification possible mastoiditis.  Patient was then admitted for evaluation and treatment.  Assessment/Plan:  Active Problems:   CKD (chronic kidney disease) stage 3, GFR 30-59 ml/min (HCC)   Hypertensive urgency   Metastatic small cell carcinoma to brain [facility])  Accelerated hypertension on presentation.  Continue amlodipine, on labetalol drip.  Head CT did not show any sign of intracranial hemorrhage.  She is on chronic steroids.  We will add a second oral antihypertensive medication at this time.  Try to wean off labetalol  Lethargy, confusion.  Spoke with husband at bedside.  Decreased oral intake.   WBC 6.7.  Covid and influenza was negative.  No evidence of fever.  Check urine analysis- test pending.  Urinary retention.  Notified by the nurse.  Will do in and out cath at this time.  Metastatic small cell lung cancer to the brain:  History of chemo and radiation therapy.  Head CT without contrast showed improvement and reduction in size of metastasis.    Spoke with oncology this morning.  Dr. Mickeal Skinner to assess the patient for further evaluation.  Chronic kidney disease stage IIIb  Creatinine of 1.7.  On presentation of 1.9.  Likely at baseline.  Mild thrombocytopenia:  Will continue to monitor.  Likely from cancer treatment.  No evidence of bleeding.  DVT prophylaxis:  rivaroxaban (XARELTO) tablet 20 mg    Code Status: Full code  Family Communication: Spoke with the patient's husband at bedside  Status is: Inpatient  Remains inpatient appropriate because:IV treatments appropriate due to intensity of illness or inability to take PO, Inpatient level of care appropriate due to severity of illness and Oncology evaluation   Dispo: The patient is from: Home              Anticipated d/c is to: Likely home, undetermined at this time.              Anticipated d/c date is: 2 days              Patient currently is not medically stable to d/c.   Consultants:  Oncology  Procedures:  None  Antibiotics:  . None  Anti-infectives (  From admission, onward)   None       Subjective: Today, patient was seen and examined at bedside.  Husband at bedside reports that she has not been eating much.  Has not had a bowel movement recently.  Very slow to respond.  Mildly communicative.  Appears to be mildly somnolent.  Objective: Vitals:   04/22/20 1000 04/22/20 1007  BP: (!) 148/91 (!) 148/91  Pulse:    Resp: (!) 24   Temp:    SpO2:      Intake/Output Summary (Last 24 hours) at 04/22/2020 1009 Last data filed at 04/22/2020 0802 Gross per 24 hour  Intake 1232.76 ml  Output --   Net 1232.76 ml   There were no vitals filed for this visit. There is no height or weight on file to calculate BMI.   Physical Exam: GENERAL: Patient is mildly somnolent, following commands, not in obvious distress. HENT: No scleral pallor or icterus. Pupils equally reactive to light. Oral mucosa is mildly dry NECK: is supple, no gross swelling noted. CHEST: Clear to auscultation. No crackles or wheezes.  Diminished breath sounds bilaterally.  Right chest wall Port-A-Cath in place. CVS: S1 and S2 heard, no murmur. Regular rate and rhythm.  ABDOMEN: Soft, non-tender, bowel sounds are present. EXTREMITIES: No edema. CNS: Cranial nerves are intact.  Moving all extremities but generalized weakness noted. SKIN: warm and dry without rashes.  Data Review: I have personally reviewed the following laboratory data and studies,  CBC: Recent Labs  Lab 04/21/20 1633 04/22/20 0515  WBC 7.7 6.7  NEUTROABS 7.3  --   HGB 13.1 10.5*  HCT 40.3 32.1*  MCV 109.5* 108.4*  PLT 145* 161*   Basic Metabolic Panel: Recent Labs  Lab 04/21/20 1633 04/22/20 0515  NA 137 136  K 3.8 4.2  CL 99 104  CO2 24 24  GLUCOSE 121* 152*  BUN 35* 36*  CREATININE 1.96* 1.75*  CALCIUM 10.1 8.9  MG 1.8  --    Liver Function Tests: Recent Labs  Lab 04/21/20 1633 04/22/20 0515  AST 22 17  ALT 23 18  ALKPHOS 50 38  BILITOT 0.9 0.6  PROT 7.5 5.6*  ALBUMIN 4.0 3.2*   No results for input(s): LIPASE, AMYLASE in the last 168 hours. No results for input(s): AMMONIA in the last 168 hours. Cardiac Enzymes: No results for input(s): CKTOTAL, CKMB, CKMBINDEX, TROPONINI in the last 168 hours. BNP (last 3 results) No results for input(s): BNP in the last 8760 hours.  ProBNP (last 3 results) No results for input(s): PROBNP in the last 8760 hours.  CBG: Recent Labs  Lab 04/21/20 1538  GLUCAP 129*   Recent Results (from the past 240 hour(s))  Respiratory Panel by RT PCR (Flu A&B, Covid) - Nasopharyngeal  Swab     Status: None   Collection Time: 04/21/20  8:55 PM   Specimen: Nasopharyngeal Swab  Result Value Ref Range Status   SARS Coronavirus 2 by RT PCR NEGATIVE NEGATIVE Final    Comment: (NOTE) SARS-CoV-2 target nucleic acids are NOT DETECTED.  The SARS-CoV-2 RNA is generally detectable in upper respiratoy specimens during the acute phase of infection. The lowest concentration of SARS-CoV-2 viral copies this assay can detect is 131 copies/mL. A negative result does not preclude SARS-Cov-2 infection and should not be used as the sole basis for treatment or other patient management decisions. A negative result may occur with  improper specimen collection/handling, submission of specimen other than nasopharyngeal swab, presence of viral  mutation(s) within the areas targeted by this assay, and inadequate number of viral copies (<131 copies/mL). A negative result must be combined with clinical observations, patient history, and epidemiological information. The expected result is Negative.  Fact Sheet for Patients:  PinkCheek.be  Fact Sheet for Healthcare Providers:  GravelBags.it  This test is no t yet approved or cleared by the [place] FDA and  has been authorized for detection and/or diagnosis of SARS-CoV-2 by FDA under an Emergency Use Authorization (EUA). This EUA will remain  in effect (meaning this test can be used) for the duration of the COVID-19 declaration under Section 564(b)(1) of the Act, 21 U.S.C. section 360bbb-3(b)(1), unless the authorization is terminated or revoked sooner.     Influenza A by PCR NEGATIVE NEGATIVE Final   Influenza B by PCR NEGATIVE NEGATIVE Final    Comment: (NOTE) The Xpert Xpress SARS-CoV-2/FLU/RSV assay is intended as an aid in  the diagnosis of influenza from Nasopharyngeal swab specimens and  should not be used as a sole basis for treatment. Nasal washings and  aspirates are  unacceptable for Xpert Xpress SARS-CoV-2/FLU/RSV  testing.  Fact Sheet for Patients: PinkCheek.be  Fact Sheet for Healthcare Providers: GravelBags.it  This test is not yet approved or cleared by the [place] FDA and  has been authorized for detection and/or diagnosis of SARS-CoV-2 by  FDA under an Emergency Use Authorization (EUA). This EUA will remain  in effect (meaning this test can be used) for the duration of the  Covid-19 declaration under Section 564(b)(1) of the Act, 21  U.S.C. section 360bbb-3(b)(1), unless the authorization is  terminated or revoked. Performed at [facility], [place] [address], [place], [place] [address]      Studies: CT Head Wo Contrast  Result Date: 04/21/2020 CLINICAL DATA:  Headache, brain tumor EXAM: CT HEAD WITHOUT CONTRAST TECHNIQUE: Contiguous axial images were obtained from the base of the skull through the vertex without intravenous contrast. COMPARISON:  Head CT 10/02/2019, MRI 01/24/2020 FINDINGS: Brain: There has been marked interval response to therapy since prior examinations. Previously noted cystic lesion within the left frontal lobe now measures 4 mm x 8 mm at axial image # 18 (previously measuring 8 mm x 12 mm). Cystic metastasis within the right cerebellar hemisphere measures 15 mm x 19 mm at axial image # 7 (previously measuring 19 mm x 21 mm). Enhancing nodule adjacent to the right caudate head is poorly delineated on this examination, but appears slightly decreased in size since prior MRI exam. Interval development of diffuse low attenuation of the deep white matter may reflect changes of radiation therapy. Cerebral edema could appear similarly, however, there is no significant associated mass effect or midline shift. No new intra or extra-axial mass lesion or fluid collection is identified. Parenchymal volume loss is stable since prior examination and commensurate  with the patient's age. Ventricular size is normal and unchanged. No evidence of acute intracranial hemorrhage or infarct. Vascular: No asymmetric hyperdense vasculature at the skull base. Skull: Intact.  No lytic lesions. Sinuses/Orbits: The paranasal sinuses are clear. The orbits are unremarkable Other: Fluid opacification of several left mastoid air cells has developed inferiorly without associated osseous erosion. Middle ear cavities and right mastoid air cells are clear. IMPRESSION: Interval response to therapy with progressive decrease in size of cystic and solid intracranial metastases. Interval development of diffuse low attenuation of the deep cerebral white matter possibly reflecting changes of a radiation therapy in the appropriate clinical setting. Interval development  of left mastoid opacification. Correlation for signs and symptoms of mastoiditis may be helpful. Electronically Signed   By: Fidela Salisbury MD   On: 04/21/2020 19:23      Flora Lipps, MD  [facility] 04/22/2020

## 2020-04-22 NOTE — ED Notes (Signed)
Meal tray given 

## 2020-04-22 NOTE — Consult Note (Signed)
[name] Consult Note  Patient Care Team: Rutherford Guys, MD as PCP - General (Family Medicine)  CHIEF COMPLAINTS/PURPOSE OF CONSULTATION:  Altered Mental Status Brain Metastases  HISTORY OF PRESENTING ILLNESS:  [name] 69 y.o. female presented to medical attention with hypertensive urgency, but in context of several weeks history of worsening confusion, gait impairment, short term memory loss, overall functional impairment at home.  She completed whole brain radiation several months ago.  Husband described generalized tremulousness and shaking "all over" without changing awareness or frank seizure activity.  MEDICAL HISTORY:  Past Medical History:  Diagnosis Date  . Arthritis   . Chronic pain   . Hyperlipidemia   . Hypertension   . Low blood magnesium 10/04/2018  . lung ca dx'd 06/2018  . Stroke ([place])    06/19/2018 minor    SURGICAL HISTORY: Past Surgical History:  Procedure Laterality Date  . BIOPSY  06/19/2018   Procedure: BIOPSY;  Surgeon: Laurence Spates, MD;  Location: [facility];  Service: Endoscopy;;  . BIOPSY  10/11/2018   Procedure: BIOPSY;  Surgeon: Laurence Spates, MD;  Location: [facility];  Service: Endoscopy;;  . ENDARTERECTOMY Right 06/26/2018   Procedure: ENDARTERECTOMY CAROTID RIGHT;  Surgeon: Serafina Mitchell, MD;  Location: [facility] OR;  Service: Vascular;  Laterality: Right;  . ESOPHAGOGASTRODUODENOSCOPY (EGD) WITH PROPOFOL N/A 06/19/2018   Procedure: ESOPHAGOGASTRODUODENOSCOPY (EGD) WITH PROPOFOL;  Surgeon: Laurence Spates, MD;  Location: [facility];  Service: Endoscopy;  Laterality: N/A;  . FLEXIBLE SIGMOIDOSCOPY N/A 10/11/2018   Procedure: FLEXIBLE SIGMOIDOSCOPY;  Surgeon: Laurence Spates, MD;  Location: [facility];  Service: Endoscopy;  Laterality: N/A;  . IR IMAGING GUIDED PORT INSERTION  10/07/2018  . PATCH ANGIOPLASTY Right 06/26/2018   Procedure: PATCH ANGIOPLASTY USING A XENOSURE 1cm x 6cm BIOLOGIC PATCH;   Surgeon: Serafina Mitchell, MD;  Location: [facility];  Service: Vascular;  Laterality: Right;  Marland Kitchen VIDEO BRONCHOSCOPY WITH ENDOBRONCHIAL ULTRASOUND N/A 08/14/2018   Procedure: VIDEO BRONCHOSCOPY WITH ENDOBRONCHIAL ULTRASOUND;  Surgeon: Collene Gobble, MD;  Location: [facility] OR;  Service: Thoracic;  Laterality: N/A;    SOCIAL HISTORY: Social History   Socioeconomic History  . Marital status: Married    Spouse name: Not on file  . Number of children: 0  . Years of education: Not on file  . Highest education level: Not on file  Occupational History  . Not on file  Tobacco Use  . Smoking status: Former Smoker    Packs/day: 1.00    Years: 10.00    Pack years: 10.00    Types: Cigarettes    Quit date: 06/19/2018    Years since quitting: 1.8  . Smokeless tobacco: Never Used  . Tobacco comment: Currently using Nicorette Gum  Vaping Use  . Vaping Use: Never used  Substance and Sexual Activity  . Alcohol use: Yes    Comment: wine 2 glasses a day   . Drug use: Never    Comment: Hemp Oil  . Sexual activity: Not Currently  Other Topics Concern  . 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 Difficulty of Paying Living Expenses: Not on file  Food Insecurity:   . Worried About Charity fundraiser in the Last Year:  Not on file  . Ran Out of Food in the Last Year: Not on file  Transportation Needs:   . Lack of Transportation (Medical): Not on file  . Lack of Transportation (Non-Medical): Not on file  Physical Activity:   . Days of Exercise per Week: Not on file  . Minutes of Exercise per Session: Not on file  Stress:   . Feeling of Stress : Not on file  Social Connections:   . Frequency of Communication with Friends and Family: Not on file  . Frequency of Social Gatherings with Friends and Family: Not on file  . Attends  Religious Services: Not on file  . Active Member of Clubs or Organizations: Not on file  . Attends Archivist Meetings: Not on file  . Marital Status: Not on file  Intimate Partner Violence:   . Fear of Current or Ex-Partner: Not on file  . Emotionally Abused: Not on file  . Physically Abused: Not on file  . Sexually Abused: Not on file    FAMILY HISTORY: Family History  Problem Relation Age of Onset  . Cancer Mother        lung cancer  . Diabetes Other   . Hyperlipidemia Other   . Hypertension Other   . Cancer Other     ALLERGIES:  has No Known Allergies.  MEDICATIONS:  Current Facility-Administered Medications  Medication Dose Route Frequency Provider Last Rate Last Admin  . acetaminophen (TYLENOL) tablet 650 mg  650 mg Oral Q6H PRN Elwyn Reach, MD       Or  . acetaminophen (TYLENOL) suppository 650 mg  650 mg Rectal Q6H PRN Gala Romney L, MD      . amLODipine (NORVASC) tablet 10 mg  10 mg Oral Daily Gala Romney L, MD   10 mg at 04/22/20 1007  . atorvastatin (LIPITOR) tablet 80 mg  80 mg Oral Daily Gala Romney L, MD   80 mg at 04/22/20 1007  . Chlorhexidine Gluconate Cloth 2 % PADS 6 each  6 each Topical Daily Pokhrel, Laxman, MD      . clobetasol cream (TEMOVATE) 2.37 % 1 application  1 application Topical BID PRN Gala Romney L, MD      . dexamethasone (DECADRON) tablet 4 mg  4 mg Oral BID Gala Romney L, MD   4 mg at 04/22/20 1006  . DULoxetine (CYMBALTA) DR capsule 30 mg  30 mg Oral Daily Gala Romney L, MD   30 mg at 04/22/20 1006  . hydrALAZINE (APRESOLINE) tablet 10 mg  10 mg Oral Q6H PRN Pokhrel, Laxman, MD      . lactated ringers infusion   Intravenous Continuous Elwyn Reach, MD 100 mL/hr at 04/22/20 1509 Rate Verify at 04/22/20 1509  . multivitamin with minerals tablet 1 tablet  1 tablet Oral Daily Elwyn Reach, MD   1 tablet at 04/21/20 2222  . ondansetron (ZOFRAN) tablet 4 mg  4 mg Oral Q6H PRN Elwyn Reach, MD        Or  . ondansetron (ZOFRAN) injection 4 mg  4 mg Intravenous Q6H PRN Gala Romney L, MD      . potassium chloride SA (KLOR-CON) CR tablet 20 mEq  20 mEq Oral Daily Elwyn Reach, MD   20 mEq at 04/21/20 2208  . pregabalin (LYRICA) capsule 50 mg  50 mg Oral Daily Gala Romney L, MD   50 mg at 04/22/20 1006  . prochlorperazine (COMPAZINE) tablet 10 mg  10 mg Oral Q6H PRN Elwyn Reach, MD      . rivaroxaban (XARELTO) tablet 20 mg  20 mg Oral Q supper Elwyn Reach, MD   20 mg at 04/21/20 2221  . traMADol (ULTRAM) tablet 50 mg  50 mg Oral QHS PRN Elwyn Reach, MD        REVIEW OF SYSTEMS:   Constitutional: Denies fevers, chills or abnormal weight loss Eyes: Denies blurriness of vision Ears, nose, mouth, throat, and face: Denies mucositis or sore throat Respiratory: Denies cough, dyspnea or wheezes Cardiovascular: Denies palpitation, chest discomfort or lower extremity swelling Gastrointestinal:  Denies nausea, constipation, diarrhea GU: Denies dysuria or incontinence Skin: Denies abnormal skin rashes Neurological: Per HPI Musculoskeletal: R leg pain Behavioral/Psych: Denies anxiety, disturbance in thought content, and mood instability   PHYSICAL EXAMINATION: Vitals:   04/22/20 1600 04/22/20 1628  BP: 124/77 120/70  Pulse: 63 61  Resp: 13 (!) 22  Temp: 97.9 F (36.6 C) 98.2 F (36.8 C)  SpO2: 97% 99%   KPS: 60. General: Alert, cooperative, pleasant, in no acute distress Head: Normal EENT: No conjunctival injection or scleral icterus. Oral mucosa moist Lungs: Resp effort normal Cardiac: Regular rate and rhythm Abdomen: Soft, non-distended abdomen Skin: No rashes cyanosis or petechiae. Extremities: No clubbing or edema  NEUROLOGIC EXAM: Mental Status: Awake, alert, attentive to examiner. Oriented to self and environment. Language is fluent with intact comprehension. Age advanced psychomotor slowing, impaired short term memory. Cranial Nerves: Visual  acuity is grossly normal. Visual fields are full. Extra-ocular movements intact. No ptosis. Face is symmetric, tongue midline. Motor: Tone and bulk are normal. Power is full in both arms and legs, with noted course asterixis on fixed posturing, occasional spontaneous asterixis at rest. Reflexes are symmetric, no pathologic reflexes present. Intact finger to nose bilaterally Sensory: Intact to light touch and temperature Gait: Deferred  LABORATORY DATA:  I have reviewed the data as listed Lab Results  Component Value Date   WBC 6.7 04/22/2020   HGB 10.5 (L) 04/22/2020   HCT 32.1 (L) 04/22/2020   MCV 108.4 (H) 04/22/2020   PLT 118 (L) 04/22/2020   Recent Labs    02/17/20 1021 02/17/20 1021 03/16/20 1108 03/16/20 1108 04/13/20 1143 04/21/20 1633 04/22/20 0515  NA 141   < > 142   < > 139 137 136  K 3.4*   < > 3.5   < > 3.7 3.8 4.2  CL 108   < > 109   < > 105 99 104  CO2 26   < > 27   < > 28 24 24   GLUCOSE 98   < > 93   < > 117* 121* 152*  BUN 13   < > 12   < > 19 35* 36*  CREATININE 1.52*   < > 1.20*   < > 1.78* 1.96* 1.75*  CALCIUM 9.4   < > 9.6   < > 9.4 10.1 8.9  GFRNONAA 35*   < > 47*   < > 29* 26* 30*  GFRAA 41*  --  54*  --  34*  --   --   PROT 5.7*   < > 5.6*   < > 6.5 7.5 5.6*  ALBUMIN 3.1*   < > 2.9*   < > 3.5 4.0 3.2*  AST 12*   < > 15   < > 19 22 17   ALT <6   < > 9   < > 17 23  18  ALKPHOS 85   < > 72   < > 61 50 38  BILITOT 0.4   < > 0.4   < > 0.5 0.9 0.6   < > = values in this interval not displayed.    RADIOGRAPHIC STUDIES: I have personally reviewed the radiological images as listed and agreed with the findings in the report. CT Head Wo Contrast  Result Date: 04/21/2020 CLINICAL DATA:  Headache, brain tumor EXAM: CT HEAD WITHOUT CONTRAST TECHNIQUE: Contiguous axial images were obtained from the base of the skull through the vertex without intravenous contrast. COMPARISON:  Head CT 10/02/2019, MRI 01/24/2020 FINDINGS: Brain: There has been marked interval  response to therapy since prior examinations. Previously noted cystic lesion within the left frontal lobe now measures 4 mm x 8 mm at axial image # 18 (previously measuring 8 mm x 12 mm). Cystic metastasis within the right cerebellar hemisphere measures 15 mm x 19 mm at axial image # 7 (previously measuring 19 mm x 21 mm). Enhancing nodule adjacent to the right caudate head is poorly delineated on this examination, but appears slightly decreased in size since prior MRI exam. Interval development of diffuse low attenuation of the deep white matter may reflect changes of radiation therapy. Cerebral edema could appear similarly, however, there is no significant associated mass effect or midline shift. No new intra or extra-axial mass lesion or fluid collection is identified. Parenchymal volume loss is stable since prior examination and commensurate with the patient's age. Ventricular size is normal and unchanged. No evidence of acute intracranial hemorrhage or infarct. Vascular: No asymmetric hyperdense vasculature at the skull base. Skull: Intact.  No lytic lesions. Sinuses/Orbits: The paranasal sinuses are clear. The orbits are unremarkable Other: Fluid opacification of several left mastoid air cells has developed inferiorly without associated osseous erosion. Middle ear cavities and right mastoid air cells are clear. IMPRESSION: Interval response to therapy with progressive decrease in size of cystic and solid intracranial metastases. Interval development of diffuse low attenuation of the deep cerebral white matter possibly reflecting changes of a radiation therapy in the appropriate clinical setting. Interval development of left mastoid opacification. Correlation for signs and symptoms of mastoiditis may be helpful. Electronically Signed   By: Fidela Salisbury MD   On: 04/21/2020 19:23   CT Chest W Contrast  Result Date: 04/10/2020 CLINICAL DATA:  Patient with history of small cell lung cancer. Follow-up exam.  EXAM: CT CHEST, ABDOMEN, AND PELVIS WITH CONTRAST TECHNIQUE: Multidetector CT imaging of the chest, abdomen and pelvis was performed following the standard protocol during bolus administration of intravenous contrast. CONTRAST:  14mL OMNIPAQUE IOHEXOL 300 MG/ML  SOLN COMPARISON:  CT C AP 02/05/2020 FINDINGS: CT CHEST FINDINGS Cardiovascular: Right anterior chest wall Port-A-Cath is present with tip terminating in the right atrium. Stable cardiac and mediastinal contours. Persistent moderate pericardial effusion. Coronary arterial vascular calcifications. Stable dilatation of the aortic arch measuring 3.6 cm. Mediastinum/Nodes: Similar-appearing 3.2 cm right paratracheal node (image 18; series 2). Similar-appearing 1.2 cm subcarinal node (image 23; series 2). No axillary adenopathy. Lungs/Pleura: Central airways are patent. Persistent small right pleural effusion. Redemonstrated right paramediastinal post radiation changes within the right upper lobe and right lower lobe. Interval resolution of previously visualized patchy areas of ground-glass attenuation within the right lower lobe. Masslike architectural distortion within the right mid lung with surrounding fibrosis and volume loss is similar to prior. Measurement at the level of the underlying treated tumor is approximately 2.3 cm, previously 2.4 cm.  Similar-appearing small scattered nodules predominately within the right upper lobe. Interval development of a 5 mm left lower lobe nodule (image 77; series 6). No pneumothorax. Musculoskeletal: Similar-appearing wedge compression deformity of the T6 vertebral body with associated sclerosis of the posterior element. Mild patchy sclerosis involving the manubrium (image 102; series 5). CT ABDOMEN PELVIS FINDINGS Hepatobiliary: The liver is normal in size and contour. No focal hepatic lesions identified. Gallbladder is unremarkable. Pancreas: Unremarkable Spleen: Unremarkable Adrenals/Urinary Tract: Similar-appearing  1.1 cm nodule left adrenal gland (image 54; series 2). Normal appearance of the right adrenal gland. Kidneys enhance symmetrically with contrast. No hydronephrosis. No suspicious renal masses. Urinary bladder is unremarkable. Stomach/Bowel: No abnormal bowel wall thickening or evidence for bowel obstruction. No free fluid or free intraperitoneal air. Normal morphology of the stomach. Vascular/Lymphatic: Peripheral calcified atherosclerotic plaque involving the abdominal aorta. Similar-appearing 3 cm aneurysmal dilatation of the abdominal aorta. No retroperitoneal or pelvic lymphadenopathy. Reproductive: Uterus and adnexal structures are unremarkable. Other: None. Musculoskeletal: Redemonstrated left hip joint degenerative changes. Redemonstrated anterior endplate sclerosis of the T11, T12 and L1 vertebral bodies, likely degenerative in etiology similar-appearing sclerosis involving the posterior elements of the L5 vertebral body, likely degenerative in etiology. IMPRESSION: 1. Interval resolution of previously visualized patchy areas of ground-glass attenuation within the right lower lobe. 2. Similar-appearing masslike architectural distortion within the right mid lung with surrounding fibrosis and volume loss. 3. Similar-appearing mediastinal adenopathy. 4. Similar-appearing sclerosis involving the posterior elements of the T6 vertebral body with associated T6 compression fracture. 5. Interval development of a 5 mm left lower lobe nodule. Recommend attention on follow-up. 6. Similar-appearing moderate pericardial effusion. 7. Similar-appearing small right pleural effusion. 8. Similar-appearing 3 cm abdominal aortic aneurysm. Recommend attention on follow-up. 9. Aortic atherosclerosis. 1 Electronically Signed   By: Lovey Newcomer M.D.   On: 04/10/2020 11:01   CT Abdomen Pelvis W Contrast  Result Date: 04/10/2020 CLINICAL DATA:  Patient with history of small cell lung cancer. Follow-up exam. EXAM: CT CHEST, ABDOMEN,  AND PELVIS WITH CONTRAST TECHNIQUE: Multidetector CT imaging of the chest, abdomen and pelvis was performed following the standard protocol during bolus administration of intravenous contrast. CONTRAST:  146mL OMNIPAQUE IOHEXOL 300 MG/ML  SOLN COMPARISON:  CT C AP 02/05/2020 FINDINGS: CT CHEST FINDINGS Cardiovascular: Right anterior chest wall Port-A-Cath is present with tip terminating in the right atrium. Stable cardiac and mediastinal contours. Persistent moderate pericardial effusion. Coronary arterial vascular calcifications. Stable dilatation of the aortic arch measuring 3.6 cm. Mediastinum/Nodes: Similar-appearing 3.2 cm right paratracheal node (image 18; series 2). Similar-appearing 1.2 cm subcarinal node (image 23; series 2). No axillary adenopathy. Lungs/Pleura: Central airways are patent. Persistent small right pleural effusion. Redemonstrated right paramediastinal post radiation changes within the right upper lobe and right lower lobe. Interval resolution of previously visualized patchy areas of ground-glass attenuation within the right lower lobe. Masslike architectural distortion within the right mid lung with surrounding fibrosis and volume loss is similar to prior. Measurement at the level of the underlying treated tumor is approximately 2.3 cm, previously 2.4 cm. Similar-appearing small scattered nodules predominately within the right upper lobe. Interval development of a 5 mm left lower lobe nodule (image 77; series 6). No pneumothorax. Musculoskeletal: Similar-appearing wedge compression deformity of the T6 vertebral body with associated sclerosis of the posterior element. Mild patchy sclerosis involving the manubrium (image 102; series 5). CT ABDOMEN PELVIS FINDINGS Hepatobiliary: The liver is normal in size and contour. No focal hepatic lesions identified. Gallbladder is unremarkable. Pancreas: Unremarkable  Spleen: Unremarkable Adrenals/Urinary Tract: Similar-appearing 1.1 cm nodule left adrenal  gland (image 54; series 2). Normal appearance of the right adrenal gland. Kidneys enhance symmetrically with contrast. No hydronephrosis. No suspicious renal masses. Urinary bladder is unremarkable. Stomach/Bowel: No abnormal bowel wall thickening or evidence for bowel obstruction. No free fluid or free intraperitoneal air. Normal morphology of the stomach. Vascular/Lymphatic: Peripheral calcified atherosclerotic plaque involving the abdominal aorta. Similar-appearing 3 cm aneurysmal dilatation of the abdominal aorta. No retroperitoneal or pelvic lymphadenopathy. Reproductive: Uterus and adnexal structures are unremarkable. Other: None. Musculoskeletal: Redemonstrated left hip joint degenerative changes. Redemonstrated anterior endplate sclerosis of the T11, T12 and L1 vertebral bodies, likely degenerative in etiology similar-appearing sclerosis involving the posterior elements of the L5 vertebral body, likely degenerative in etiology. IMPRESSION: 1. Interval resolution of previously visualized patchy areas of ground-glass attenuation within the right lower lobe. 2. Similar-appearing masslike architectural distortion within the right mid lung with surrounding fibrosis and volume loss. 3. Similar-appearing mediastinal adenopathy. 4. Similar-appearing sclerosis involving the posterior elements of the T6 vertebral body with associated T6 compression fracture. 5. Interval development of a 5 mm left lower lobe nodule. Recommend attention on follow-up. 6. Similar-appearing moderate pericardial effusion. 7. Similar-appearing small right pleural effusion. 8. Similar-appearing 3 cm abdominal aortic aneurysm. Recommend attention on follow-up. 9. Aortic atherosclerosis. 1 Electronically Signed   By: Lovey Newcomer M.D.   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  The degree of gliosis involving diffuse white matter tracts is clearly progressed even when compared to MRI scan from less than 3 months prior (01/24/20).    Counseled patient and her husband, they understand that these degnerative changes are unlikely to be reversible, and may in fact continue to progress in coming weeks/months.  We may have some medications to help manage the symptoms, but not alter underlying disease course   Recommendations:   -Trial of Amantadine 100mg  BID for movement disorder/asterixis, cognitive and fatigue complaints -Vitamin E 400u daily, and Trental 400mg  BID for radiation necrosis -Con't decadron 4mg  daily  She may defer MRI scan for now if unable to obtain.  We will plan on following up with her and her husband in the clinic to assess response to these interventions.  In the meantime, recommend full evaluation of disposition needs through case manager, physical therapy.    Will also continue to follow up with Dr. Julien Nordmann for management of lung cancer.  Case will be discussed in brain/spine tumor board on 04/27/20  All questions were answered. The patient knows to call the clinic with any problems, questions or concerns.  The total time spent in the encounter was 55 minutes and more than 50% was on counseling and review of test results     Ventura Sellers, MD 04/22/2020 4:52 PM

## 2020-04-23 ENCOUNTER — Inpatient Hospital Stay: Admission: RE | Admit: 2020-04-23 | Payer: Medicare PPO | Source: Ambulatory Visit

## 2020-04-23 DIAGNOSIS — I160 Hypertensive urgency: Secondary | ICD-10-CM | POA: Diagnosis not present

## 2020-04-23 DIAGNOSIS — C7989 Secondary malignant neoplasm of other specified sites: Secondary | ICD-10-CM | POA: Diagnosis not present

## 2020-04-23 DIAGNOSIS — N1831 Chronic kidney disease, stage 3a: Secondary | ICD-10-CM | POA: Diagnosis not present

## 2020-04-23 DIAGNOSIS — C7931 Secondary malignant neoplasm of brain: Secondary | ICD-10-CM | POA: Diagnosis not present

## 2020-04-23 LAB — COMPREHENSIVE METABOLIC PANEL
ALT: 18 U/L (ref 0–44)
AST: 15 U/L (ref 15–41)
Albumin: 3.3 g/dL — ABNORMAL LOW (ref 3.5–5.0)
Alkaline Phosphatase: 34 U/L — ABNORMAL LOW (ref 38–126)
Anion gap: 9 (ref 5–15)
BUN: 33 mg/dL — ABNORMAL HIGH (ref 8–23)
CO2: 23 mmol/L (ref 22–32)
Calcium: 9 mg/dL (ref 8.9–10.3)
Chloride: 105 mmol/L (ref 98–111)
Creatinine, Ser: 1.52 mg/dL — ABNORMAL HIGH (ref 0.44–1.00)
GFR calc non Af Amer: 35 mL/min — ABNORMAL LOW (ref >60)
Glucose, Bld: 108 mg/dL — ABNORMAL HIGH (ref 70–99)
Potassium: 3.9 mmol/L (ref 3.5–5.1)
Sodium: 137 mmol/L (ref 135–145)
Total Bilirubin: 0.5 mg/dL (ref 0.3–1.2)
Total Protein: 5.3 g/dL — ABNORMAL LOW (ref 6.5–8.1)

## 2020-04-23 LAB — CBC
HCT: 32.4 % — ABNORMAL LOW (ref 36.0–46.0)
Hemoglobin: 10.4 g/dL — ABNORMAL LOW (ref 12.0–15.0)
MCH: 35.5 pg — ABNORMAL HIGH (ref 26.0–34.0)
MCHC: 32.1 g/dL (ref 30.0–36.0)
MCV: 110.6 fL — ABNORMAL HIGH (ref 80.0–100.0)
Platelets: 119 10*3/uL — ABNORMAL LOW (ref 150–400)
RBC: 2.93 MIL/uL — ABNORMAL LOW (ref 3.87–5.11)
RDW: 16.2 % — ABNORMAL HIGH (ref 11.5–15.5)
WBC: 8.8 10*3/uL (ref 4.0–10.5)
nRBC: 0 % (ref 0.0–0.2)

## 2020-04-23 LAB — MAGNESIUM: Magnesium: 1.7 mg/dL (ref 1.7–2.4)

## 2020-04-23 MED ORDER — SODIUM CHLORIDE 0.9% FLUSH: 10.0000 mL | INTRAVENOUS | Status: DC | PRN

## 2020-04-23 MED ORDER — ENSURE ENLIVE PO LIQD
237.0000 mL | Freq: Two times a day (BID) | ORAL | Status: DC
  Administered 2020-04-24 – 2020-04-25 (×2): 237 mL via ORAL

## 2020-04-23 NOTE — Progress Notes (Signed)
Chaplain rec'd referral from staff to visit patient "if she's receptive."  Patient was sitting upright in bed in darkened room. Her husband Annissa Andreoni was in chair beside her. When chaplain asked if it they would like a visit, the husband said "thank you, but she's taking a rest right now." Chaplain said she would offer a silent prayer outside room and both wife and husband seemed to nod in appreciation.   Chaplain will continue to follow. Rev. Tamsen Snider Pager [phone]

## 2020-04-23 NOTE — Evaluation (Signed)
Clinical/Bedside Swallow Evaluation Patient Details  Name: [name] MRN: [id number] Date of Birth: 1950-09-16  Today's Date: 04/23/2020 Time: SLP Start Time (ACUTE ONLY): 0932 SLP Stop Time (ACUTE ONLY): 1530 SLP Time Calculation (min) (ACUTE ONLY): 19 min  Past Medical History:  Past Medical History:  Diagnosis Date  . Arthritis   . Chronic pain   . Hyperlipidemia   . Hypertension   . Low blood magnesium 10/04/2018  . lung ca dx'd 06/2018  . Stroke [facility])    06/19/2018 minor   Past Surgical History:  Past Surgical History:  Procedure Laterality Date  . BIOPSY  06/19/2018   Procedure: BIOPSY;  Surgeon: Laurence Spates, MD;  Location: [facility]COPY;  Service: Endoscopy;;  . BIOPSY  10/11/2018   Procedure: BIOPSY;  Surgeon: Laurence Spates, MD;  Location: [facility];  Service: Endoscopy;;  . ENDARTERECTOMY Right 06/26/2018   Procedure: ENDARTERECTOMY CAROTID RIGHT;  Surgeon: Serafina Mitchell, MD;  Location: [facility] OR;  Service: Vascular;  Laterality: Right;  . ESOPHAGOGASTRODUODENOSCOPY (EGD) WITH PROPOFOL N/A 06/19/2018   Procedure: ESOPHAGOGASTRODUODENOSCOPY (EGD) WITH PROPOFOL;  Surgeon: Laurence Spates, MD;  Location: [facility];  Service: Endoscopy;  Laterality: N/A;  . FLEXIBLE SIGMOIDOSCOPY N/A 10/11/2018   Procedure: FLEXIBLE SIGMOIDOSCOPY;  Surgeon: Laurence Spates, MD;  Location: [facility];  Service: Endoscopy;  Laterality: N/A;  . IR IMAGING GUIDED PORT INSERTION  10/07/2018  . PATCH ANGIOPLASTY Right 06/26/2018   Procedure: PATCH ANGIOPLASTY USING A XENOSURE 1cm x 6cm BIOLOGIC PATCH;  Surgeon: Serafina Mitchell, MD;  Location: [place];  Service: Vascular;  Laterality: Right;  Marland Kitchen VIDEO BRONCHOSCOPY WITH ENDOBRONCHIAL ULTRASOUND N/A 08/14/2018   Procedure: VIDEO BRONCHOSCOPY WITH ENDOBRONCHIAL ULTRASOUND;  Surgeon: Collene Gobble, MD;  Location: [facility] OR;  Service: Thoracic;  Laterality: N/A;   HPI:  [name] is a 69 y.o. female with medical history significant of  metastatic small cell lung cancer to the brain, CVA, chronic kidney disease stage III, essential hypertension, recurrent GI bleed, who is being assessed for probably recurrent brain metastasis and MRI was being scheduled this week. Head CT without contrast shows progressive decrease in size of cystic and solid intracranial masses..  There was interval development of left mastoid opacification possible mastoiditis.    Assessment / Plan / Recommendation Clinical Impression  Pt demonstrates a cognitively based primarily oral dysphagia marked by decreased sustained attention and oral holding. Her swallow is audible with subswallows noted but no coughing or changes in vocal quality. Her husband states she will occasionaly cough but not frequently. Once focused she masticated solid texture without difficulty, At this point, do not recommend texture downgrade. As with decreased appetite, downgrading texture would limit choices and may impact intake. Suspect she will need downgrade as brain mets progresses and discussed with husband. Encouraged him to order foods that are softer. Provided husband with toothette sponges to remove pocketed food if present. Crush meds. No further ST needed.      SLP Visit Diagnosis: Dysphagia, oral phase (R13.11)    Aspiration Risk  Mild aspiration risk    Diet Recommendation Regular;Thin liquid   Liquid Administration via: Cup;Straw Medication Administration: Crushed with puree Supervision: Staff to assist with self feeding;Full supervision/cueing for compensatory strategies Compensations: Minimize environmental distractions;Slow rate;Small sips/bites;Lingual sweep for clearance of pocketing Postural Changes: Seated upright at 90 degrees    Other  Recommendations Oral Care Recommendations: Oral care BID   Follow up Recommendations None      Frequency and Duration  Prognosis        Swallow Study   General HPI: [name] is a 69 y.o. female  with medical history significant of metastatic small cell lung cancer to the brain, CVA, chronic kidney disease stage III, essential hypertension, recurrent GI bleed, who is being assessed for probably recurrent brain metastasis and MRI was being scheduled this week. Head CT without contrast shows progressive decrease in size of cystic and solid intracranial masses..  There was interval development of left mastoid opacification possible mastoiditis.  Type of Study: Bedside Swallow Evaluation Previous Swallow Assessment:  (none) Diet Prior to this Study: Regular;Thin liquids Temperature Spikes Noted: No Respiratory Status: Room air History of Recent Intubation: No Behavior/Cognition: Alert;Cooperative;Requires cueing Oral Cavity Assessment: Within Functional Limits Oral Care Completed by SLP: No Oral Cavity - Dentition: Dentures, top;Dentures, bottom Vision: Functional for self-feeding Self-Feeding Abilities: Needs assist Patient Positioning: Upright in bed Baseline Vocal Quality: Normal Volitional Cough:  (moderately strong) Volitional Swallow: Able to elicit    Oral/Motor/Sensory Function Overall Oral Motor/Sensory Function: Within functional limits   Ice Chips Ice chips: Not tested   Thin Liquid Thin Liquid: Impaired Presentation: Straw;Cup Oral Phase Impairments: Poor awareness of bolus Oral Phase Functional Implications: Oral holding Pharyngeal  Phase Impairments: Multiple swallows (audible swallow)    Nectar Thick Nectar Thick Liquid: Not tested   Honey Thick Honey Thick Liquid: Not tested   Puree Puree: Impaired Oral Phase Functional Implications: Oral holding Pharyngeal Phase Impairments: Multiple swallows (audible swallow)   Solid     Solid: Within functional limits      Houston Siren 04/23/2020,4:01 PM

## 2020-04-23 NOTE — Care Management Important Message (Signed)
Important Message  Patient Details IM Letter given to the Patient Name: [name] MRN: [id number] Date of Birth: 07-01-1951   Medicare Important Message Given:  Yes     Kerin Salen 04/23/2020, 11:03 AM

## 2020-04-23 NOTE — Progress Notes (Signed)
Initial Nutrition Assessment  INTERVENTION:   -Ensure Enlive po BID, each supplement provides 350 kcal and 20 grams of protein -Magic cup BID with meals, each supplement provides 290 kcal and 9 grams of protein  NUTRITION DIAGNOSIS:   Increased nutrient needs related to cancer and cancer related treatments as evidenced by estimated needs.  GOAL:   Patient will meet greater than or equal to 90% of their needs  MONITOR:   PO intake, Supplement acceptance, Labs, Weight trends, I & O's  REASON FOR ASSESSMENT:   Malnutrition Screening Tool    ASSESSMENT:   69 y.o. female with medical history significant of metastatic small cell lung cancer to the brain, history of CVA, chronic kidney disease stage III, essential hypertension, recurrent GI bleed, presented to hospital with progressive lethargy, weakness at home.  Pt alert but disoriented. Unable to provide history. Per oncology note, pt has had worsening confusion over the past few weeks d/t brain mets. Last chemotherapy treatment was on 9/28.  S/p BSE today: SLP recommends regular diet with thin liquids. Pt with mild aspiration risk. Recommended husband order pt softer foods.   Will order Ensure and Magic cups for additional kcals and protein.  Per weight records, pt has lost 20 lbs since 7/12 (11% wt loss x 3 months, significant for time frame).  Labs reviewed. Medications: Multivitamin with minerals daily, KLOR-CON, Vitamin E, Lactated ringers  NUTRITION - FOCUSED PHYSICAL EXAM:  Unable to complete  Diet Order:   Diet Order            Diet Heart Room service appropriate? Yes; Fluid consistency: Thin  Diet effective now                 EDUCATION NEEDS:   No education needs have been identified at this time  Skin:  Skin Assessment: Reviewed RN Assessment  Last BM:  10/6  Height:   Ht Readings from Last 1 Encounters:  04/22/20 5\' 6"  (1.676 m)    Weight:   Wt Readings from Last 1 Encounters:  04/22/20  73.2 kg    BMI:  Body mass index is 26.05 kg/m.  Estimated Nutritional Needs:   Kcal:  1900-2100  Protein:  100-115g  Fluid:  2L/day  Clayton Bibles, MS, RD, LDN Inpatient Clinical Dietitian Contact information available via Amion

## 2020-04-23 NOTE — Progress Notes (Addendum)
PROGRESS NOTE  [name] GUR:[id number] DOB: 09-14-1950 DOA: 04/21/2020 PCP: Rutherford Guys, MD   LOS: 2 days   Brief narrative: As per HPI,  [name] is a 69 y.o. female with medical history significant of metastatic small cell lung cancer to the brain, history of CVA, chronic kidney disease stage III, essential hypertension, recurrent GI bleed, presented to hospital with progressive lethargy, weakness at home.  Husband brought patient in due to these symptoms. She has history of hypertension and was  taking her medicine however her systolic blood pressure was found to be above 283 and diastolic up to 151.  She is having persistent headaches.  No focal weakness.  Patient was then admitted with hypertensive urgency in the setting of her history of possible metastatic cancer to the brain.  She had history of remote CVA but has recovered mostly.  In the ED, temperature 97.9 blood pressure 228/130 pulse 89 respirate of 18 oxygen sat 98% on room air.  Chemistry shows a creatinine of 1.96 with BUN 35 and glucose 121.  CBCwith platelets 145.  COVID-19 and respiratory panel all negative.  TSH 0.723.  Head CT without contrast shows progressive decrease in size of cystic and solid intracranial masses.  Also changes of radiation therapy.  There was interval development of left mastoid opacification possible mastoiditis.  Patient was then admitted for evaluation and treatment.  Assessment/Plan:  Active Problems:   CKD (chronic kidney disease) stage 3, GFR 30-59 ml/min (HCC)   Hypertensive urgency   Metastatic small cell carcinoma to brain ([place])   Metastasis ([place])  Accelerated hypertension on presentation.  Continue amlodipine. Head CT did not show any sign of intracranial hemorrhage.  She is on chronic steroids.  Improved blood pressure.  As needed hydralazine at this time.  Lethargy, confusion.  Could be secondary to metastatic cerebral disease/radiation necrosis.  Oncology on board  and recommended amantadine continuation of steroids, vitamin E, pentoxifylline which has been started since yesterday.  No leukocytosis.  Covid and influenza was negative.  No evidence of fever.  Urinalysis negative at this time.  Mentation slightly improved today and the patient is more communicative.  Urinary retention.  Needed in and out cath yesterday.  Continue external urinary catheter.  Metastatic small cell lung cancer to the brain:  History of chemo and radiation therapy.  Head CT without contrast showed improvement and reduction in size of metastasis.    Oncology on board.    AKI on Chronic kidney disease stage IIIb   On presentation of 1.9.   Continue IV fluids at LR at 100 ml/hour. Improved with IVFs to 1.5 today. Will continue IV fluids for now.  Mild thrombocytopenia:  Will continue to monitor.  Likely from cancer treatment.  No evidence of bleeding.  Debility, weakness, deconditioning.  PT, OT evaluation pending.  Might need skilled nursing facility placement.  DVT prophylaxis:  rivaroxaban (XARELTO) tablet 20 mg   Code Status: Full code  Family Communication: Spoke with the patient's husband at bedside.   Status is: Inpatient  Remains inpatient appropriate because:IV treatments appropriate due to intensity of illness or inability to take PO, Inpatient level of care appropriate due to severity of illness and Oncology evaluation, debility deconditioning   Dispo: The patient is from: Home              Anticipated d/c is to: likely SNF, PT pending.              Anticipated d/c date  is: 2 days              Patient currently is not medically stable to d/c.   Consultants:  Oncology  Procedures:  None  Antibiotics:  . None  Anti-infectives (From admission, onward)   None     Subjective: Today, patient was seen and examined at bedside.  Appears to be more alert awake.  Denies any headache, nausea, vomiting but has generalized weakness.   Objective: Vitals:    04/22/20 2057 04/23/20 0505  BP: 110/71 128/75  Pulse: (!) 59 64  Resp: 14 14  Temp: (!) 97.5 F (36.4 C) 97.7 F (36.5 C)  SpO2: (!) 83% 97%    Intake/Output Summary (Last 24 hours) at 04/23/2020 0739 Last data filed at 04/23/2020 0600 Gross per 24 hour  Intake 2016.58 ml  Output 900 ml  Net 1116.58 ml   Filed Weights   04/22/20 1753  Weight: 73.2 kg   Body mass index is 26.05 kg/m.   Physical Exam:  GENERAL: Patient is more alert awake but disoriented, following commands, not in obvious distress. HENT: No scleral pallor or icterus. Pupils equally reactive to light. Oral mucosa is mildly dry NECK: is supple, no gross swelling noted. CHEST: Clear to auscultation. No crackles or wheezes.  Diminished breath sounds bilaterally.  Right chest wall Port-A-Cath in place. CVS: S1 and S2 heard, no murmur. Regular rate and rhythm.  ABDOMEN: Soft, non-tender, bowel sounds are present. EXTREMITIES: No edema. CNS: Cranial nerves are intact.  Moving all extremities but generalized weakness noted. SKIN: warm and dry without rashes.  Data Review: I have personally reviewed the following laboratory data and studies,  CBC: Recent Labs  Lab 04/21/20 1633 04/22/20 0515 04/23/20 0446  WBC 7.7 6.7 8.8  NEUTROABS 7.3  --   --   HGB 13.1 10.5* 10.4*  HCT 40.3 32.1* 32.4*  MCV 109.5* 108.4* 110.6*  PLT 145* 118* 706*   Basic Metabolic Panel: Recent Labs  Lab 04/21/20 1633 04/22/20 0515 04/23/20 0446  NA 137 136 137  K 3.8 4.2 3.9  CL 99 104 105  CO2 24 24 23   GLUCOSE 121* 152* 108*  BUN 35* 36* 33*  CREATININE 1.96* 1.75* 1.52*  CALCIUM 10.1 8.9 9.0  MG 1.8  --  1.7   Liver Function Tests: Recent Labs  Lab 04/21/20 1633 04/22/20 0515 04/23/20 0446  AST 22 17 15   ALT 23 18 18   ALKPHOS 50 38 34*  BILITOT 0.9 0.6 0.5  PROT 7.5 5.6* 5.3*  ALBUMIN 4.0 3.2* 3.3*   No results for input(s): LIPASE, AMYLASE in the last 168 hours. No results for input(s): AMMONIA in the  last 168 hours. Cardiac Enzymes: No results for input(s): CKTOTAL, CKMB, CKMBINDEX, TROPONINI in the last 168 hours. BNP (last 3 results) No results for input(s): BNP in the last 8760 hours.  ProBNP (last 3 results) No results for input(s): PROBNP in the last 8760 hours.  CBG: Recent Labs  Lab 04/21/20 1538  GLUCAP 129*   Recent Results (from the past 240 hour(s))  Respiratory Panel by RT PCR (Flu A&B, Covid) - Nasopharyngeal Swab     Status: None   Collection Time: 04/21/20  8:55 PM   Specimen: Nasopharyngeal Swab  Result Value Ref Range Status   SARS Coronavirus 2 by RT PCR NEGATIVE NEGATIVE Final    Comment: (NOTE) SARS-CoV-2 target nucleic acids are NOT DETECTED.  The SARS-CoV-2 RNA is generally detectable in upper respiratoy specimens during the acute  phase of infection. The lowest concentration of SARS-CoV-2 viral copies this assay can detect is 131 copies/mL. A negative result does not preclude SARS-Cov-2 infection and should not be used as the sole basis for treatment or other patient management decisions. A negative result may occur with  improper specimen collection/handling, submission of specimen other than nasopharyngeal swab, presence of viral mutation(s) within the areas targeted by this assay, and inadequate number of viral copies (<131 copies/mL). A negative result must be combined with clinical observations, patient history, and epidemiological information. The expected result is Negative.  Fact Sheet for Patients:  PinkCheek.be  Fact Sheet for Healthcare Providers:  GravelBags.it  This test is no t yet approved or cleared by the [place] FDA and  has been authorized for detection and/or diagnosis of SARS-CoV-2 by FDA under an Emergency Use Authorization (EUA). This EUA will remain  in effect (meaning this test can be used) for the duration of the COVID-19 declaration under Section 564(b)(1)  of the Act, 21 U.S.C. section 360bbb-3(b)(1), unless the authorization is terminated or revoked sooner.     Influenza A by PCR NEGATIVE NEGATIVE Final   Influenza B by PCR NEGATIVE NEGATIVE Final    Comment: (NOTE) The Xpert Xpress SARS-CoV-2/FLU/RSV assay is intended as an aid in  the diagnosis of influenza from Nasopharyngeal swab specimens and  should not be used as a sole basis for treatment. Nasal washings and  aspirates are unacceptable for Xpert Xpress SARS-CoV-2/FLU/RSV  testing.  Fact Sheet for Patients: PinkCheek.be  Fact Sheet for Healthcare Providers: GravelBags.it  This test is not yet approved or cleared by the [place] FDA and  has been authorized for detection and/or diagnosis of SARS-CoV-2 by  FDA under an Emergency Use Authorization (EUA). This EUA will remain  in effect (meaning this test can be used) for the duration of the  Covid-19 declaration under Section 564(b)(1) of the Act, 21  U.S.C. section 360bbb-3(b)(1), unless the authorization is  terminated or revoked. Performed at [facility], [place] [address], [place],  [address]      Studies: CT Head Wo Contrast  Result Date: 04/21/2020 CLINICAL DATA:  Headache, brain tumor EXAM: CT HEAD WITHOUT CONTRAST TECHNIQUE: Contiguous axial images were obtained from the base of the skull through the vertex without intravenous contrast. COMPARISON:  Head CT 10/02/2019, MRI 01/24/2020 FINDINGS: Brain: There has been marked interval response to therapy since prior examinations. Previously noted cystic lesion within the left frontal lobe now measures 4 mm x 8 mm at axial image # 18 (previously measuring 8 mm x 12 mm). Cystic metastasis within the right cerebellar hemisphere measures 15 mm x 19 mm at axial image # 7 (previously measuring 19 mm x 21 mm). Enhancing nodule adjacent to the right caudate head is poorly delineated on this  examination, but appears slightly decreased in size since prior MRI exam. Interval development of diffuse low attenuation of the deep white matter may reflect changes of radiation therapy. Cerebral edema could appear similarly, however, there is no significant associated mass effect or midline shift. No new intra or extra-axial mass lesion or fluid collection is identified. Parenchymal volume loss is stable since prior examination and commensurate with the patient's age. Ventricular size is normal and unchanged. No evidence of acute intracranial hemorrhage or infarct. Vascular: No asymmetric hyperdense vasculature at the skull base. Skull: Intact.  No lytic lesions. Sinuses/Orbits: The paranasal sinuses are clear. The orbits are unremarkable Other: Fluid opacification of several left mastoid  air cells has developed inferiorly without associated osseous erosion. Middle ear cavities and right mastoid air cells are clear. IMPRESSION: Interval response to therapy with progressive decrease in size of cystic and solid intracranial metastases. Interval development of diffuse low attenuation of the deep cerebral white matter possibly reflecting changes of a radiation therapy in the appropriate clinical setting. Interval development of left mastoid opacification. Correlation for signs and symptoms of mastoiditis may be helpful. Electronically Signed   By: Fidela Salisbury MD   On: 04/21/2020 19:23      Flora Lipps, MD  [facility] 04/23/2020

## 2020-04-23 NOTE — Evaluation (Signed)
Physical Therapy Evaluation Patient Details Name: [name] MRN: [id number] DOB: 04/17/1951 Today's Date: 04/23/2020   History of Present Illness  69 y.o. female with medical history significant of metastatic small cell lung cancer to the brain, history of CVA, chronic kidney disease stage III, essential hypertension, recurrent GI bleed, presented to hospital with progressive lethargy, weakness at home.  Pt admitted for accelerated hypertension, lethargy/confusion, and left mastoid opacification possible mastoiditis  Clinical Impression  Pt admitted with above diagnosis.  Pt currently with functional limitations due to the deficits listed below (see PT Problem List). Pt will benefit from skilled PT to increase their independence and safety with mobility to allow discharge to the venue listed below.  Spouse reports pt has been requiring assist to ambulate about a week prior to admission and has been leaning backwards.  Pt also with new "spinning" upon returning to bed.  Pt assisted with standing however with strong posterior bias throughout sitting and standing.  Pt assisted back to supine and reports spinning and nausea which subsided within 5 minutes.  Pt appears to have progression of neurological symptoms (hx of brain mets and radiation).  Spouse agreeable for SNF at this time as he understands he can no longer physically assist pt in current condition however hopeful to bring pt home as soon as possible.     Follow Up Recommendations SNF    Equipment Recommendations  Hospital bed    Recommendations for Other Services       Precautions / Restrictions Precautions Precautions: Fall Precaution Comments: significant posterior bias      Mobility  Bed Mobility Overal bed mobility: Needs Assistance Bed Mobility: Supine to Sit;Sit to Supine     Supine to sit: Total assist;+2 for physical assistance Sit to supine: Total assist;+2 for physical assistance   General bed mobility  comments: pt with very stiff LEs, requiring assist for upper and lower body  Transfers Overall transfer level: Needs assistance Equipment used: 2 person hand held assist Transfers: Sit to/from Stand Sit to Stand: Max assist;+2 physical assistance         General transfer comment: pt attempting to assist however has strong posterior bias and unable to correct with provided cues, pt fatigued quickly and requested return to sitting (maintained posterior lean throughout)  Ambulation/Gait             General Gait Details: deferred  Stairs            Wheelchair Mobility    Modified Rankin (Stroke Patients Only)       Balance Overall balance assessment: Needs assistance Sitting-balance support: Bilateral upper extremity supported;Feet supported Sitting balance-Leahy Scale: Zero   Postural control: Posterior lean Standing balance support: Bilateral upper extremity supported Standing balance-Leahy Scale: Zero                               Pertinent Vitals/Pain Pain Assessment: No/denies pain Faces Pain Scale: Hurts little more Pain Intervention(s): Monitored during session    Home Living Family/patient expects to be discharged to:: Private residence Living Arrangements: Spouse/significant other Available Help at Discharge: Family;Available 24 hours/day Type of Home: House Home Access: Stairs to enter   [employer] of Steps: 2 Home Layout: Multi-level Home Equipment: Shower seat;Wheelchair - [employer] - 2 wheels      Prior Function Level of Independence: Independent         Comments: was independent however leading up to admission, spouse  was holding pt from behind and ambulating with her, she started to require more assist and had posterior lean per spouse descriptions     Hand Dominance        Extremity/Trunk Assessment   Upper Extremity Assessment Upper Extremity Assessment: Generalized weakness    Lower Extremity  Assessment Lower Extremity Assessment: Generalized weakness       Communication   Communication: No difficulties  Cognition Arousal/Alertness: Awake/alert Behavior During Therapy: Flat affect Overall Cognitive Status: Impaired/Different from baseline                                 General Comments: slow to process, does better with yes/no questions, difficulty describing how she feels      General Comments      Exercises     Assessment/Plan    PT Assessment Patient needs continued PT services  PT Problem List Decreased strength;Decreased mobility;Decreased balance;Decreased activity tolerance;Decreased knowledge of use of DME       PT Treatment Interventions DME instruction;Therapeutic exercise;Functional mobility training;Therapeutic activities;Patient/family education;Gait training;Balance training;Neuromuscular re-education;Wheelchair mobility training    PT Goals (Current goals can be found in the Care Plan section)  Acute Rehab PT Goals PT Goal Formulation: With patient/family Time For Goal Achievement: 05/07/20 Potential to Achieve Goals: Fair    Frequency Min 3X/week   Barriers to discharge        Co-evaluation               AM-PAC PT "6 Clicks" Mobility  Outcome Measure Help needed turning from your back to your side while in a flat bed without using bedrails?: Total Help needed moving from lying on your back to sitting on the side of a flat bed without using bedrails?: Total Help needed moving to and from a bed to a chair (including a wheelchair)?: Total Help needed standing up from a chair using your arms (e.g., wheelchair or bedside chair)?: Total Help needed to walk in hospital room?: Total Help needed climbing 3-5 steps with a railing? : Total 6 Click Score: 6    End of Session Equipment Utilized During Treatment: Gait belt Activity Tolerance: Patient tolerated treatment well Patient left: in bed;with call bell/phone within  reach;with bed alarm set Nurse Communication: Mobility status PT Visit Diagnosis: Other abnormalities of gait and mobility (R26.89);Unsteadiness on feet (R26.81);Other symptoms and signs involving the nervous system (Y63.785)    Time: 8850-2774 PT Time Calculation (min) (ACUTE ONLY): 26 min   Charges:   PT Evaluation $PT Eval Moderate Complexity: 1 Mod        Kati PT, DPT Acute Rehabilitation Services Pager: [phone] Office: [phone]  Trena Platt 04/23/2020, 4:42 PM

## 2020-04-24 DIAGNOSIS — N1831 Chronic kidney disease, stage 3a: Secondary | ICD-10-CM | POA: Diagnosis not present

## 2020-04-24 DIAGNOSIS — I160 Hypertensive urgency: Secondary | ICD-10-CM | POA: Diagnosis not present

## 2020-04-24 DIAGNOSIS — C7989 Secondary malignant neoplasm of other specified sites: Secondary | ICD-10-CM | POA: Diagnosis not present

## 2020-04-24 DIAGNOSIS — C7931 Secondary malignant neoplasm of brain: Secondary | ICD-10-CM | POA: Diagnosis not present

## 2020-04-24 LAB — BASIC METABOLIC PANEL
Anion gap: 11 (ref 5–15)
BUN: 28 mg/dL — ABNORMAL HIGH (ref 8–23)
CO2: 23 mmol/L (ref 22–32)
Calcium: 9.5 mg/dL (ref 8.9–10.3)
Chloride: 103 mmol/L (ref 98–111)
Creatinine, Ser: 1.35 mg/dL — ABNORMAL HIGH (ref 0.44–1.00)
GFR, Estimated: 41 mL/min — ABNORMAL LOW (ref >60)
Glucose, Bld: 104 mg/dL — ABNORMAL HIGH (ref 70–99)
Potassium: 3.6 mmol/L (ref 3.5–5.1)
Sodium: 137 mmol/L (ref 135–145)

## 2020-04-24 LAB — CBC
HCT: 36.7 % (ref 36.0–46.0)
Hemoglobin: 12.3 g/dL (ref 12.0–15.0)
MCH: 35.7 pg — ABNORMAL HIGH (ref 26.0–34.0)
MCHC: 33.5 g/dL (ref 30.0–36.0)
MCV: 106.4 fL — ABNORMAL HIGH (ref 80.0–100.0)
Platelets: 132 10*3/uL — ABNORMAL LOW (ref 150–400)
RBC: 3.45 MIL/uL — ABNORMAL LOW (ref 3.87–5.11)
RDW: 15.9 % — ABNORMAL HIGH (ref 11.5–15.5)
WBC: 10.8 10*3/uL — ABNORMAL HIGH (ref 4.0–10.5)
nRBC: 0 % (ref 0.0–0.2)

## 2020-04-24 NOTE — Evaluation (Signed)
Occupational Therapy Evaluation Patient Details Name: [name] MRN: [id number] DOB: March 22, 1951 Today's Date: 04/24/2020    History of Present Illness 69 y.o. female with medical history significant of metastatic small cell lung cancer to the brain, history of CVA, chronic kidney disease stage III, essential hypertension, recurrent GI bleed, presented to hospital with progressive lethargy, weakness at home.  Pt admitted for accelerated hypertension, lethargy/confusion, and left mastoid opacification possible mastoiditis   Clinical Impression   [name] is a 69 year old woman admitted to hospital with above medical history who presents with generalized weakness, decreased activity tolerance, impaired balance, cognitive deficits including decreased ability to motor plan, and decreased use of LUE (potential for some mild inattention) resulting in significant decline in ability to perform transfers, ambulate and perform self care tasks. On evaluation patient requires assistance x 2 to transfer to side of bed with strong posterior lean requiring predominantly max assistance to maintain erect posture at edge of bed. Increased time with patient to promote anterior movement to reduce posterior lean with minimal results. Patient required assistance to initiate movement of legs when transferring to side of bed and to maintain foot position on the floor. Patient raising right arm with command but not left required active assist to raise left shoulder. Patient needing hand over hand at times to use left arm - patient maintaining position at her side with fingers predominantly curled around telemetry wires. She can be cued to use her left hand at times when asked to perform finger to thumb and give a thumbs up. Patient stated the wrong time on wall clock but able to see grossly well. Patient's verbalization predominantly yes/no and patient unable to state details during conversation - she is not  oriented to place, time or situation. Patient is near total care for ADLs at bed level at this time. Patient will benefit from skilled OT services to improve deficits and learn compensatory strategies as needed in order to improve functional abilities and to reduce caregiver burden.    Follow Up Recommendations  SNF    Equipment Recommendations  None recommended by OT (Defer to Next Venue)    Recommendations for Other Services       Precautions / Restrictions Precautions Precautions: Fall Precaution Comments: significant posterior bias Restrictions Weight Bearing Restrictions: No      Mobility Bed Mobility Overal bed mobility: Needs Assistance Bed Mobility: Supine to Sit;Sit to Supine     Supine to sit: Total assist;+2 for physical assistance Sit to supine: Total assist;+2 for physical assistance   General bed mobility comments: Needs assistance for LEs (patient not initiating movement either) and trunk negotiation. Significant posterior lean.  Transfers                      Balance Overall balance assessment: Needs assistance Sitting-balance support: No upper extremity supported;Feet supported Sitting balance-Leahy Scale: Zero Sitting balance - Comments: Significant posterior lean. Attempts to have patient lean forward limited. Lean worsens with fatigue. Postural control: Posterior lean                                 ADL either performed or assessed with clinical judgement   ADL Overall ADL's : Needs assistance/impaired Eating/Feeding: Total assistance;Bed level   Grooming: Maximal assistance;Set up;Bed level   Upper Body Bathing: Maximal assistance;Bed level;Set up   Lower Body Bathing: Maximal assistance;Set up;Bed level   Upper  Body Dressing : Maximal assistance;Set up;Bed level   Lower Body Dressing: Total assistance;Set up;Bed level     Toilet Transfer Details (indicate cue type and reason): unable Toileting- Clothing Manipulation  and Hygiene: Total assistance;Bed level               Vision   Additional Comments: Formal vision assessment not performed - patient's ability to participate limited. Patient states time on clock incorrectly. Able to state correct number of fingers therapist is holding up in left and right quadrant.     Perception     Praxis      Pertinent Vitals/Pain Pain Assessment: No/denies pain     Hand Dominance     Extremity/Trunk Assessment Upper Extremity Assessment Upper Extremity Assessment: RUE deficits/detail;LUE deficits/detail RUE Deficits / Details: Grossly 3/5 - unable to resist with MMT; reports normal sensation, grossly functional fine motor coordination with finger to thumb. LUE Deficits / Details: Needed active assist to raise LUE, some inattention to left arm compared to left, 3-/5 shoulder, otherwise 3/5 for elbow, wrist and fingers. Grossly functional sensation and coordination though some decrease in accuracy compared to right. Patient has tendency to keep fingers curled around cords. Patient can be cued to open fingers, give thumbs up   Lower Extremity Assessment Lower Extremity Assessment: Defer to PT evaluation   Cervical / Trunk Assessment Cervical / Trunk Assessment: Normal   Communication Communication Communication: No difficulties   Cognition Arousal/Alertness: Awake/alert Behavior During Therapy: Flat affect Overall Cognitive Status: Impaired/Different from baseline                                 General Comments: slow to process, does better with yes/no questions, difficulty describing how she feels   General Comments       Exercises     Shoulder Instructions      Home Living Family/patient expects to be discharged to:: Private residence Living Arrangements: Spouse/significant other Available Help at Discharge: Family;Available 24 hours/day Type of Home: House Home Access: Stairs to enter [employer] of Steps: 2    Home Layout: Multi-level Alternate Level Stairs-Number of Steps: 7 up to kitchen/bedroom, 6 down to den Alternate Level Stairs-Rails: Right           Home Equipment: Shower seat;Wheelchair - [employer] - 2 wheels          Prior Functioning/Environment Level of Independence: Independent        Comments: was independent however leading up to admission, spouse was holding pt from behind and ambulating with her, she started to require more assist and had posterior lean per spouse descriptions        OT Problem List: Decreased strength;Decreased range of motion;Decreased activity tolerance;Impaired balance (sitting and/or standing);Impaired vision/perception;Decreased coordination;Decreased cognition;Decreased safety awareness;Decreased knowledge of use of DME or AE;Impaired UE functional use;Obesity      OT Treatment/Interventions: Self-care/ADL training;Therapeutic exercise;Neuromuscular education;DME and/or AE instruction;Therapeutic activities;Cognitive remediation/compensation;Balance training;Patient/family education    OT Goals(Current goals can be found in the care plan section) Acute Rehab OT Goals Patient Stated Goal: To go to rehab OT Goal Formulation: With family Time For Goal Achievement: 05/08/20 Potential to Achieve Goals: Fair  OT Frequency: Min 2X/week   Barriers to D/C:            Co-evaluation PT/OT/SLP Co-Evaluation/Treatment: Yes Reason for Co-Treatment: Complexity of the patient's impairments (multi-system involvement);For patient/therapist safety;To address functional/ADL transfers PT goals addressed during session: Mobility/safety  with mobility OT goals addressed during session: ADL's and self-care      AM-PAC OT "6 Clicks" Daily Activity     Outcome Measure Help from another person eating meals?: A Lot Help from another person taking care of personal grooming?: A Lot Help from another person toileting, which includes using toliet, bedpan, or  urinal?: Total Help from another person bathing (including washing, rinsing, drying)?: A Lot Help from another person to put on and taking off regular upper body clothing?: A Lot Help from another person to put on and taking off regular lower body clothing?: Total 6 Click Score: 10   End of Session Nurse Communication: Mobility status  Activity Tolerance: Patient tolerated treatment well Patient left: in bed;with call bell/phone within reach;with bed alarm set;with family/visitor present  OT Visit Diagnosis: Muscle weakness (generalized) (M62.81);Other symptoms and signs involving the nervous system (R29.898);Other symptoms and signs involving cognitive function;Dizziness and giddiness (R42)                Time: 6015-6153 OT Time Calculation (min): 25 min Charges:  OT General Charges $OT Visit: 1 Visit OT Evaluation $OT Eval High Complexity: 1 High  Shereese Bonnie, OTR/L [place]  Office [phone] Pager: Jet 04/24/2020, 2:25 PM

## 2020-04-24 NOTE — Progress Notes (Signed)
PROGRESS NOTE  BRYSTOL Hobbs NID:[id number] DOB: 24-Apr-1951 DOA: 04/21/2020 PCP: Rutherford Guys, MD   LOS: 3 days   Brief narrative: As per HPI,  [name] is a 69 y.o. female with medical history significant of metastatic small cell lung cancer to the brain, history of CVA, chronic kidney disease stage III, essential hypertension, recurrent GI bleed, presented to the hospital with progressive lethargy, weakness at home.  Patient has a history of hypertension  and despite taking her medication her systolic blood pressure was noted to be more than 144 with diastolic up to 315.  She was having persistent headache and was brought into the hospital.   In the ED, temperature 97.9 blood pressure 228/130 pulse 89 respirate of 18 oxygen sat 98% on room air.  Chemistry shows a creatinine of 1.96 with BUN 35 and glucose 121.  CBCwith platelets 145.  COVID-19 and respiratory panel all negative.  TSH 0.723.  Head CT without contrast shows progressive decrease in size of cystic and solid intracranial masses.  Also changes of radiation therapy.  There was interval development of left mastoid opacification possible mastoiditis.  Patient was then admitted for evaluation and treatment.  Assessment/Plan:  Active Problems:   CKD (chronic kidney disease) stage 3, GFR 30-59 ml/min (HCC)   Hypertensive urgency   Metastatic small cell carcinoma to brain ([place])   Metastasis ([place])  Accelerated hypertension on presentation.  Briefly required labetalol drip.  Continue amlodipine,  As needed hydralazine at this time.   Lethargy, confusion.  secondary to metastatic cerebral disease/radiation necrosis.  Seen by oncology and has been started on amantadine, continue dexamethasone, vitamin EN pentoxifylline has been initiated this admission for possible radiation necrosis.   Extremely poor intake. Continue IV fluid hydration, encouraged nutrition.   AKI on Chronic kidney disease stage IIIb   On presentation  of 1.9.  Currently receiving IV fluids and creatinine has improved to 1.3. We will continue to monitor creatinine levels.  Volume depletion. Likely secondary to poor oral intake. On IV fluids. Will continue for now.   Urinary retention.  Currently improved.  Continue external urinary catheter.  Metastatic small cell lung cancer to the brain:  History of chemo and radiation therapy.  Head CT without contrast showed improvement and reduction in size of metastasis.    Oncology on board.  Will need to follow-up with oncology as outpatient.    Mild thrombocytopenia:  Will continue to monitor.  Likely from cancer treatment.  No evidence of bleeding.  Platelets trending up.  Extreme debility, weakness, deconditioning.  PT, OT evaluation recommended skilled nursing facility placement . Patient is barely able to move any extremities.   DVT prophylaxis:  rivaroxaban (XARELTO) tablet 20 mg   Code Status: Full code. Overall prognosis of the patient appears poor.   Family Communication:  I again spoke with the patient's husband at bedside.   Status is: Inpatient  Remains inpatient appropriate because:IV treatments appropriate due to intensity of illness or inability to take PO, Inpatient level of care appropriate due to severity of illness and Oncology evaluation, debility/ deconditioning, ambulatory dysfunction, unsafe discharge, IV fluids for hydration.   Dispo: The patient is from: Home              Anticipated d/c is to: Skilled nursing facility              Anticipated d/c date is: 2 days              Patient  currently is not medically stable to d/c.   Consultants:  Oncology  Procedures:  None  Antibiotics:  . None  Anti-infectives (From admission, onward)   None     Subjective: Today, patient was seen and examined at bedside.  Patient's husband states that she gets confused, not eating at all. Very weak and unable to move much.    Objective: Vitals:   04/23/20 2141  04/24/20 0410  BP: (!) 152/90 (!) 168/115  Pulse: 72 93  Resp: 16 18  Temp: (!) 97.5 F (36.4 C) 98.1 F (36.7 C)  SpO2: 98% 99%    Intake/Output Summary (Last 24 hours) at 04/24/2020 0800 Last data filed at 04/24/2020 0600 Gross per 24 hour  Intake 1359.68 ml  Output 750 ml  Net 609.68 ml   Filed Weights   04/22/20 1753  Weight: 73.2 kg   Body mass index is 26.05 kg/m.   Physical Exam:  GENERAL: Patient is confused and disoriented about able to answer questions,  , following commands, not in obvious distress. HENT: No scleral pallor or icterus. Pupils equally reactive to light. Oral mucosa is mildly dry NECK: is supple, no gross swelling noted. CHEST: Clear to auscultation. No crackles or wheezes.  Diminished breath sounds bilaterally.  Right chest wall Port-A-Cath in place. CVS: S1 and S2 heard, no murmur. Regular rate and rhythm.  ABDOMEN: Soft, non-tender, bowel sounds are present. EXTREMITIES: No edema. CNS:   Moving all extremities mininmally but generalized weakness noted. SKIN: warm and dry without rashes.  Data Review: I have personally reviewed the following laboratory data and studies,  CBC: Recent Labs  Lab 04/21/20 1633 04/22/20 0515 04/23/20 0446 04/24/20 0420  WBC 7.7 6.7 8.8 10.8*  NEUTROABS 7.3  --   --   --   HGB 13.1 10.5* 10.4* 12.3  HCT 40.3 32.1* 32.4* 36.7  MCV 109.5* 108.4* 110.6* 106.4*  PLT 145* 118* 119* 563*   Basic Metabolic Panel: Recent Labs  Lab 04/21/20 1633 04/22/20 0515 04/23/20 0446 04/24/20 0420  NA 137 136 137 137  K 3.8 4.2 3.9 3.6  CL 99 104 105 103  CO2 24 24 23 23   GLUCOSE 121* 152* 108* 104*  BUN 35* 36* 33* 28*  CREATININE 1.96* 1.75* 1.52* 1.35*  CALCIUM 10.1 8.9 9.0 9.5  MG 1.8  --  1.7  --    Liver Function Tests: Recent Labs  Lab 04/21/20 1633 04/22/20 0515 04/23/20 0446  AST 22 17 15   ALT 23 18 18   ALKPHOS 50 38 34*  BILITOT 0.9 0.6 0.5  PROT 7.5 5.6* 5.3*  ALBUMIN 4.0 3.2* 3.3*   No  results for input(s): LIPASE, AMYLASE in the last 168 hours. No results for input(s): AMMONIA in the last 168 hours. Cardiac Enzymes: No results for input(s): CKTOTAL, CKMB, CKMBINDEX, TROPONINI in the last 168 hours. BNP (last 3 results) No results for input(s): BNP in the last 8760 hours.  ProBNP (last 3 results) No results for input(s): PROBNP in the last 8760 hours.  CBG: Recent Labs  Lab 04/21/20 1538  GLUCAP 129*   Recent Results (from the past 240 hour(s))  Respiratory Panel by RT PCR (Flu A&B, Covid) - Nasopharyngeal Swab     Status: None   Collection Time: 04/21/20  8:55 PM   Specimen: Nasopharyngeal Swab  Result Value Ref Range Status   SARS Coronavirus 2 by RT PCR NEGATIVE NEGATIVE Final    Comment: (NOTE) SARS-CoV-2 target nucleic acids are NOT DETECTED.  The  SARS-CoV-2 RNA is generally detectable in upper respiratoy specimens during the acute phase of infection. The lowest concentration of SARS-CoV-2 viral copies this assay can detect is 131 copies/mL. A negative result does not preclude SARS-Cov-2 infection and should not be used as the sole basis for treatment or other patient management decisions. A negative result may occur with  improper specimen collection/handling, submission of specimen other than nasopharyngeal swab, presence of viral mutation(s) within the areas targeted by this assay, and inadequate number of viral copies (<131 copies/mL). A negative result must be combined with clinical observations, patient history, and epidemiological information. The expected result is Negative.  Fact Sheet for Patients:  PinkCheek.be  Fact Sheet for Healthcare Providers:  GravelBags.it  This test is no t yet approved or cleared by the [place] FDA and  has been authorized for detection and/or diagnosis of SARS-CoV-2 by FDA under an Emergency Use Authorization (EUA). This EUA will remain  in effect  (meaning this test can be used) for the duration of the COVID-19 declaration under Section 564(b)(1) of the Act, 21 U.S.C. section 360bbb-3(b)(1), unless the authorization is terminated or revoked sooner.     Influenza A by PCR NEGATIVE NEGATIVE Final   Influenza B by PCR NEGATIVE NEGATIVE Final    Comment: (NOTE) The Xpert Xpress SARS-CoV-2/FLU/RSV assay is intended as an aid in  the diagnosis of influenza from Nasopharyngeal swab specimens and  should not be used as a sole basis for treatment. Nasal washings and  aspirates are unacceptable for Xpert Xpress SARS-CoV-2/FLU/RSV  testing.  Fact Sheet for Patients: PinkCheek.be  Fact Sheet for Healthcare Providers: GravelBags.it  This test is not yet approved or cleared by the [place] FDA and  has been authorized for detection and/or diagnosis of SARS-CoV-2 by  FDA under an Emergency Use Authorization (EUA). This EUA will remain  in effect (meaning this test can be used) for the duration of the  Covid-19 declaration under Section 564(b)(1) of the Act, 21  U.S.C. section 360bbb-3(b)(1), unless the authorization is  terminated or revoked. Performed at [facility], [place] [address], [place], [place] [address]      Studies: No results found.    Flora Lipps, MD  [facility] 04/24/2020

## 2020-04-24 NOTE — Progress Notes (Signed)
Pt had a run of v-tach (3 PVCs). Pt was assessed and found to be resting in bed, asymptomatic. Yellow MEWs protocol was activated. Pt's PRN hydrazaline was administered for elevated BP. Nurse consulted with Baxter Flattery. Dr. Louanne Belton notified.

## 2020-04-24 NOTE — Progress Notes (Signed)
Physical Therapy Treatment Patient Details Name: [name] MRN: [id number] DOB: 26-Aug-1950 Today's Date: 04/24/2020    History of Present Illness 69 y.o. female with medical history significant of metastatic small cell lung cancer to the brain, history of CVA, chronic kidney disease stage III, essential hypertension, recurrent GI bleed, presented to hospital with progressive lethargy, weakness at home.  Pt admitted for accelerated hypertension, lethargy/confusion, and left mastoid opacification possible mastoiditis    PT Comments    Pt assisted with sitting EOB.  Pt with significant posterior lean during any movement or activity.  Focused today on leaning forward and improving trunk flexion in sitting with multiple attempts however pt unable to maintain and difficulty with reach and holding with left UE.  Pt required frequent rest breaks with total assist for trunk support.  Continue to recommend SNF upon d/c.   Follow Up Recommendations  SNF     Equipment Recommendations  Hospital bed    Recommendations for Other Services       Precautions / Restrictions Precautions Precautions: Fall Precaution Comments: significant posterior bias Restrictions Weight Bearing Restrictions: No    Mobility  Bed Mobility Overal bed mobility: Needs Assistance Bed Mobility: Supine to Sit;Sit to Supine     Supine to sit: Total assist;+2 for physical assistance Sit to supine: Total assist;+2 for physical assistance   General bed mobility comments: Needs assistance for LEs (patient not initiating movement either) and trunk negotiation. Significant posterior lean.  Transfers                    Ambulation/Gait                 Stairs             Wheelchair Mobility    Modified Rankin (Stroke Patients Only)       Balance Overall balance assessment: Needs assistance Sitting-balance support: No upper extremity supported;Feet supported Sitting balance-Leahy  Scale: Zero Sitting balance - Comments: Significant posterior lean. Attempts to have patient lean forward limited. Lean worsens with fatigue. Postural control: Posterior lean                                  Cognition Arousal/Alertness: Awake/alert Behavior During Therapy: Flat affect Overall Cognitive Status: Impaired/Different from baseline                                 General Comments: slow to process, does better with yes/no questions, difficulty describing how she feels      Exercises      General Comments        Pertinent Vitals/Pain Pain Assessment: No/denies pain    Home Living Family/patient expects to be discharged to:: Private residence Living Arrangements: Spouse/significant other Available Help at Discharge: Family;Available 24 hours/day Type of Home: House Home Access: Stairs to enter   Home Layout: Multi-level Home Equipment: Shower seat;Wheelchair - [employer] - 2 wheels      Prior Function Level of Independence: Independent      Comments: was independent however leading up to admission, spouse was holding pt from behind and ambulating with her, she started to require more assist and had posterior lean per spouse descriptions   PT Goals (current goals can now be found in the care plan section) Acute Rehab PT Goals Patient Stated Goal: To go to rehab Progress towards  PT goals: Progressing toward goals    Frequency    Min 3X/week      PT Plan Current plan remains appropriate    Co-evaluation PT/OT/SLP Co-Evaluation/Treatment: Yes Reason for Co-Treatment: Complexity of the patient's impairments (multi-system involvement);For patient/therapist safety;To address functional/ADL transfers PT goals addressed during session: Mobility/safety with mobility OT goals addressed during session: ADL's and self-care      AM-PAC PT "6 Clicks" Mobility   Outcome Measure  Help needed turning from your back to your side  while in a flat bed without using bedrails?: Total Help needed moving from lying on your back to sitting on the side of a flat bed without using bedrails?: Total Help needed moving to and from a bed to a chair (including a wheelchair)?: Total Help needed standing up from a chair using your arms (e.g., wheelchair or bedside chair)?: Total Help needed to walk in hospital room?: Total Help needed climbing 3-5 steps with a railing? : Total 6 Click Score: 6    End of Session   Activity Tolerance: Patient tolerated treatment well Patient left: in bed;with call bell/phone within reach;with bed alarm set;with nursing/sitter in room   PT Visit Diagnosis: Other abnormalities of gait and mobility (R26.89);Other symptoms and signs involving the nervous system (R29.898)     Time: 2947-6546 PT Time Calculation (min) (ACUTE ONLY): 25 min  Charges:  $Therapeutic Activity: 8-22 mins                    Arlyce Dice, DPT Acute Rehabilitation Services Pager: [phone] Office: [phone]  Larose Batres,KATHrine E 04/24/2020, 4:00 PM

## 2020-04-25 DIAGNOSIS — N1831 Chronic kidney disease, stage 3a: Secondary | ICD-10-CM | POA: Diagnosis not present

## 2020-04-25 DIAGNOSIS — I160 Hypertensive urgency: Secondary | ICD-10-CM | POA: Diagnosis not present

## 2020-04-25 DIAGNOSIS — R5383 Other fatigue: Secondary | ICD-10-CM | POA: Diagnosis not present

## 2020-04-25 DIAGNOSIS — C7931 Secondary malignant neoplasm of brain: Secondary | ICD-10-CM | POA: Diagnosis not present

## 2020-04-25 LAB — CBC
HCT: 38.3 % (ref 36.0–46.0)
Hemoglobin: 13.1 g/dL (ref 12.0–15.0)
MCH: 36 pg — ABNORMAL HIGH (ref 26.0–34.0)
MCHC: 34.2 g/dL (ref 30.0–36.0)
MCV: 105.2 fL — ABNORMAL HIGH (ref 80.0–100.0)
Platelets: 130 10*3/uL — ABNORMAL LOW (ref 150–400)
RBC: 3.64 MIL/uL — ABNORMAL LOW (ref 3.87–5.11)
RDW: 15.8 % — ABNORMAL HIGH (ref 11.5–15.5)
WBC: 11.8 10*3/uL — ABNORMAL HIGH (ref 4.0–10.5)
nRBC: 0 % (ref 0.0–0.2)

## 2020-04-25 LAB — BASIC METABOLIC PANEL
Anion gap: 11 (ref 5–15)
BUN: 25 mg/dL — ABNORMAL HIGH (ref 8–23)
CO2: 22 mmol/L (ref 22–32)
Calcium: 9.4 mg/dL (ref 8.9–10.3)
Chloride: 99 mmol/L (ref 98–111)
Creatinine, Ser: 1.24 mg/dL — ABNORMAL HIGH (ref 0.44–1.00)
GFR, Estimated: 45 mL/min — ABNORMAL LOW (ref >60)
Glucose, Bld: 100 mg/dL — ABNORMAL HIGH (ref 70–99)
Potassium: 4.3 mmol/L (ref 3.5–5.1)
Sodium: 132 mmol/L — ABNORMAL LOW (ref 135–145)

## 2020-04-25 LAB — MAGNESIUM: Magnesium: 1.8 mg/dL (ref 1.7–2.4)

## 2020-04-25 MED ORDER — HYDRALAZINE HCL 50 MG PO TABS
50.0000 mg | ORAL_TABLET | Freq: Three times a day (TID) | ORAL | Status: DC
  Administered 2020-04-25: 50 mg via ORAL
  Filled 2020-04-25: qty 1

## 2020-04-25 MED ORDER — SODIUM CHLORIDE 0.9 % IV SOLN: INTRAVENOUS | Status: DC

## 2020-04-25 MED ORDER — PANTOPRAZOLE SODIUM 40 MG IV SOLR
40.0000 mg | Freq: Every day | INTRAVENOUS | Status: DC
  Administered 2020-04-25 – 2020-04-27 (×3): 40 mg via INTRAVENOUS
  Filled 2020-04-25 (×3): qty 40

## 2020-04-25 MED ORDER — DEXTROSE-NACL 5-0.9 % IV SOLN: INTRAVENOUS | Status: DC

## 2020-04-25 MED ORDER — LABETALOL HCL 5 MG/ML IV SOLN
10.0000 mg | INTRAVENOUS | Status: DC | PRN
  Administered 2020-04-25: 10 mg via INTRAVENOUS
  Filled 2020-04-25: qty 4

## 2020-04-25 MED ORDER — HYDRALAZINE HCL 20 MG/ML IJ SOLN
10.0000 mg | Freq: Four times a day (QID) | INTRAMUSCULAR | Status: DC
  Administered 2020-04-25 – 2020-04-27 (×9): 10 mg via INTRAVENOUS
  Filled 2020-04-25 (×10): qty 1

## 2020-04-25 MED ORDER — DEXAMETHASONE SODIUM PHOSPHATE 4 MG/ML IJ SOLN
4.0000 mg | Freq: Two times a day (BID) | INTRAMUSCULAR | Status: DC
  Administered 2020-04-25 – 2020-04-27 (×5): 4 mg via INTRAVENOUS
  Filled 2020-04-25 (×5): qty 1

## 2020-04-25 MED ORDER — CLONIDINE HCL 0.1 MG PO TABS
0.1000 mg | ORAL_TABLET | Freq: Once | ORAL | Status: AC
  Administered 2020-04-25: 0.1 mg via ORAL
  Filled 2020-04-25: qty 1

## 2020-04-25 NOTE — Progress Notes (Signed)
This am patient experienced some difficulty with po medications. Medications were crushed in applesauce. Pt has delayed response for swallowing. Provider updated. Will continue to monitor.

## 2020-04-25 NOTE — Progress Notes (Addendum)
PROGRESS NOTE  [name] EHM:[id number] DOB: Sep 14, 1950 DOA: 04/21/2020 PCP: Rutherford Guys, MD   LOS: 4 days   Brief narrative: As per HPI,  [name] is a 69 y.o. female with medical history significant of metastatic small cell lung cancer to the brain, history of CVA, chronic kidney disease stage III, essential hypertension, recurrent GI bleed, presented to the hospital with progressive lethargy, weakness at home.  Patient has a history of hypertension  and despite taking her medication her systolic blood pressure was noted to be more than 366 with diastolic up to 294.  She was having persistent headache and was brought into the hospital.   In the ED, temperature 97.9 blood pressure 228/130 pulse 89 respirate of 18 oxygen sat 98% on room air.  Chemistry shows a creatinine of 1.96 with BUN 35 and glucose 121.  CBCwith platelets 145.  COVID-19 and respiratory panel all negative.  TSH 0.723.  Head CT without contrast shows progressive decrease in size of cystic and solid intracranial masses.  Also changes of radiation therapy.  There was interval development of left mastoid opacification possible mastoiditis.  Patient was then admitted for evaluation and treatment.  Assessment/Plan:  Active Problems:   CKD (chronic kidney disease) stage 3, GFR 30-59 ml/min (HCC)   Hypertensive urgency   Metastatic small cell carcinoma to brain ([place])   Metastasis ([place])  Accelerated hypertension on presentation.  Blood pressure has started to accelerate again.  Was on amlodipine.  Change to IV hydralazine.  Give first dose now.  Added IV labetalol.  Unable to give p.o. at this time due to decreased mentation.  Will closely monitor.  Received  clonidine x1 today.  This could be onset of rising intracranial pressure.   Lethargy, confusion, worsening mental status..  Patient is minimally responsive.  Could be secondary to secondary to metastatic cerebral disease/radiation necrosis with rising  ICP.Marland Kitchen   on amantadine, dexamethasone, vitamin E and pentoxifylline as per oncology for possible radiation necrosis but will not be able to take p.o. today.  Will change dexamethasone to IV twice daily, start IV fluids.    Extremely poor intake.  On IV hydration.  We will continue D5 normal saline for now.  AKI on Chronic kidney disease stage IIIb   On presentation of 1.9.  Creatinine 1.2 today.  Improving on IV fluids.  Volume depletion. Likely secondary to poor oral intake. On IV fluids. Will continue for now, decrease fluid rate today.  Urinary retention.  Currently improved.  Continue external urinary catheter.  Metastatic small cell lung cancer to the brain:  Worsening mental status despite being on decadron, amantadine and pentoxifylline.  Poor prognosis.  History of chemo and radiation therapy.  Head CT without contrast showed improvement and reduction in size of metastasis.    Oncology on board.      Mild thrombocytopenia:  Will continue to monitor.  Likely from cancer treatment.  Platelet is 130 today.  Extreme debility, weakness, deconditioning.  PT, OT evaluation recommended skilled nursing facility placement . Patient is barely able to move any extremities.   DVT prophylaxis:  rivaroxaban (XARELTO) tablet 20 mg   Code Status:  Overall prognosis of the patient appears poor.  Spoke with the patient's brother at bedside and with the patient's husband.  Patient's husband indicated that she did have a living will in the past and would not want to be resuscitated or put on life support.  Will change the patient's status to DNR at  this time.  Patient has a high risk of rapid deterioration at this time.  Family Communication: Spoke with the patient's brother at bedside and husband on the phone.  Status is: Inpatient  Remains inpatient appropriate because:IV treatments appropriate due to intensity of illness or inability to take PO, Inpatient level of care appropriate due to severity of  illness and Oncology evaluation, extreme debility/ deconditioning, ambulatory dysfunction, unsafe discharge, accelerated hypertension, worsening mental status   Dispo: The patient is from: Home              Anticipated d/c is to: Skilled nursing facility              Anticipated d/c date is: 2 days              Patient currently is not medically stable to d/c.   Consultants:  Oncology  Procedures:  None  Antibiotics:  . None  Anti-infectives (From admission, onward)   None     Subjective:  Today, patient is obtunded ,unresponsive unable to make any purposeful response and discharge.  Patient's brother at bedside.  Objective: Vitals:   04/25/20 0424 04/25/20 0500  BP: (!) 203/108 (!) 189/129  Pulse: 88   Resp:    Temp: 97.8 F (36.6 C)   SpO2: 100%     Intake/Output Summary (Last 24 hours) at 04/25/2020 0818 Last data filed at 04/24/2020 1730 Gross per 24 hour  Intake 1144.19 ml  Output 700 ml  Net 444.19 ml   Filed Weights   04/22/20 1753  Weight: 73.2 kg   Body mass index is 26.05 kg/m.   Physical Exam: General: Patient is obtunded with out meaningful response, HENT:   No scleral pallor or icterus noted.  Chest: Diminished breath sound bilateral.  Right chest wall Port-A-Cath in place. CVS: S1 &S2 heard. No murmur.  Regular rate and rhythm. Abdomen: Soft, nontender, nondistended.  Bowel sounds are heard.   Extremities: No cyanosis, clubbing or edema.  Peripheral pulses are palpable. Psych: Obtunded, lethargic without meaningful response CNS: Unable to assess.   Skin: Warm and dry.  No rashes noted.  Data Review: I have personally reviewed the following laboratory data and studies,  CBC: Recent Labs  Lab 04/21/20 1633 04/22/20 0515 04/23/20 0446 04/24/20 0420 04/25/20 0339  WBC 7.7 6.7 8.8 10.8* 11.8*  NEUTROABS 7.3  --   --   --   --   HGB 13.1 10.5* 10.4* 12.3 13.1  HCT 40.3 32.1* 32.4* 36.7 38.3  MCV 109.5* 108.4* 110.6* 106.4* 105.2*   PLT 145* 118* 119* 132* 831*   Basic Metabolic Panel: Recent Labs  Lab 04/21/20 1633 04/22/20 0515 04/23/20 0446 04/24/20 0420 04/25/20 0339  NA 137 136 137 137 132*  K 3.8 4.2 3.9 3.6 4.3  CL 99 104 105 103 99  CO2 24 24 23 23 22   GLUCOSE 121* 152* 108* 104* 100*  BUN 35* 36* 33* 28* 25*  CREATININE 1.96* 1.75* 1.52* 1.35* 1.24*  CALCIUM 10.1 8.9 9.0 9.5 9.4  MG 1.8  --  1.7  --  1.8   Liver Function Tests: Recent Labs  Lab 04/21/20 1633 04/22/20 0515 04/23/20 0446  AST 22 17 15   ALT 23 18 18   ALKPHOS 50 38 34*  BILITOT 0.9 0.6 0.5  PROT 7.5 5.6* 5.3*  ALBUMIN 4.0 3.2* 3.3*   No results for input(s): LIPASE, AMYLASE in the last 168 hours. No results for input(s): AMMONIA in the last 168 hours. Cardiac Enzymes:  No results for input(s): CKTOTAL, CKMB, CKMBINDEX, TROPONINI in the last 168 hours. BNP (last 3 results) No results for input(s): BNP in the last 8760 hours.  ProBNP (last 3 results) No results for input(s): PROBNP in the last 8760 hours.  CBG: Recent Labs  Lab 04/21/20 1538  GLUCAP 129*   Recent Results (from the past 240 hour(s))  Respiratory Panel by RT PCR (Flu A&B, Covid) - Nasopharyngeal Swab     Status: None   Collection Time: 04/21/20  8:55 PM   Specimen: Nasopharyngeal Swab  Result Value Ref Range Status   SARS Coronavirus 2 by RT PCR NEGATIVE NEGATIVE Final    Comment: (NOTE) SARS-CoV-2 target nucleic acids are NOT DETECTED.  The SARS-CoV-2 RNA is generally detectable in upper respiratoy specimens during the acute phase of infection. The lowest concentration of SARS-CoV-2 viral copies this assay can detect is 131 copies/mL. A negative result does not preclude SARS-Cov-2 infection and should not be used as the sole basis for treatment or other patient management decisions. A negative result may occur with  improper specimen collection/handling, submission of specimen other than nasopharyngeal swab, presence of viral mutation(s) within  the areas targeted by this assay, and inadequate number of viral copies (<131 copies/mL). A negative result must be combined with clinical observations, patient history, and epidemiological information. The expected result is Negative.  Fact Sheet for Patients:  PinkCheek.be  Fact Sheet for Healthcare Providers:  GravelBags.it  This test is no t yet approved or cleared by the [place] FDA and  has been authorized for detection and/or diagnosis of SARS-CoV-2 by FDA under an Emergency Use Authorization (EUA). This EUA will remain  in effect (meaning this test can be used) for the duration of the COVID-19 declaration under Section 564(b)(1) of the Act, 21 U.S.C. section 360bbb-3(b)(1), unless the authorization is terminated or revoked sooner.     Influenza A by PCR NEGATIVE NEGATIVE Final   Influenza B by PCR NEGATIVE NEGATIVE Final    Comment: (NOTE) The Xpert Xpress SARS-CoV-2/FLU/RSV assay is intended as an aid in  the diagnosis of influenza from Nasopharyngeal swab specimens and  should not be used as a sole basis for treatment. Nasal washings and  aspirates are unacceptable for Xpert Xpress SARS-CoV-2/FLU/RSV  testing.  Fact Sheet for Patients: PinkCheek.be  Fact Sheet for Healthcare Providers: GravelBags.it  This test is not yet approved or cleared by the [place] FDA and  has been authorized for detection and/or diagnosis of SARS-CoV-2 by  FDA under an Emergency Use Authorization (EUA). This EUA will remain  in effect (meaning this test can be used) for the duration of the  Covid-19 declaration under Section 564(b)(1) of the Act, 21  U.S.C. section 360bbb-3(b)(1), unless the authorization is  terminated or revoked. Performed at [facility], [place] [address], [place], [place] [address]      Studies: No results  found.    Flora Lipps, MD  [facility] 04/25/2020

## 2020-04-26 DIAGNOSIS — C7931 Secondary malignant neoplasm of brain: Secondary | ICD-10-CM | POA: Diagnosis not present

## 2020-04-26 DIAGNOSIS — I160 Hypertensive urgency: Secondary | ICD-10-CM | POA: Diagnosis not present

## 2020-04-26 DIAGNOSIS — R5383 Other fatigue: Secondary | ICD-10-CM | POA: Diagnosis not present

## 2020-04-26 DIAGNOSIS — N1831 Chronic kidney disease, stage 3a: Secondary | ICD-10-CM | POA: Diagnosis not present

## 2020-04-26 LAB — BASIC METABOLIC PANEL
Anion gap: 10 (ref 5–15)
BUN: 28 mg/dL — ABNORMAL HIGH (ref 8–23)
CO2: 21 mmol/L — ABNORMAL LOW (ref 22–32)
Calcium: 9.2 mg/dL (ref 8.9–10.3)
Chloride: 102 mmol/L (ref 98–111)
Creatinine, Ser: 1.44 mg/dL — ABNORMAL HIGH (ref 0.44–1.00)
GFR, Estimated: 37 mL/min — ABNORMAL LOW (ref >60)
Glucose, Bld: 126 mg/dL — ABNORMAL HIGH (ref 70–99)
Potassium: 4.2 mmol/L (ref 3.5–5.1)
Sodium: 133 mmol/L — ABNORMAL LOW (ref 135–145)

## 2020-04-26 LAB — CBC
HCT: 35.2 % — ABNORMAL LOW (ref 36.0–46.0)
Hemoglobin: 11.8 g/dL — ABNORMAL LOW (ref 12.0–15.0)
MCH: 35.8 pg — ABNORMAL HIGH (ref 26.0–34.0)
MCHC: 33.5 g/dL (ref 30.0–36.0)
MCV: 106.7 fL — ABNORMAL HIGH (ref 80.0–100.0)
Platelets: 117 10*3/uL — ABNORMAL LOW (ref 150–400)
RBC: 3.3 MIL/uL — ABNORMAL LOW (ref 3.87–5.11)
RDW: 16.3 % — ABNORMAL HIGH (ref 11.5–15.5)
WBC: 8.4 10*3/uL (ref 4.0–10.5)
nRBC: 0 % (ref 0.0–0.2)

## 2020-04-26 NOTE — Progress Notes (Signed)
Physical Therapy Treatment Patient Details Name: [name] MRN: [id number] DOB: 1950/11/05 Today's Date: 04/26/2020    History of Present Illness 69 y.o. female with medical history significant of metastatic small cell lung cancer to the brain, history of CVA, chronic kidney disease stage III, essential hypertension, recurrent GI bleed, presented to hospital with progressive lethargy, weakness at home.  Pt admitted for accelerated hypertension, lethargy/confusion, and left mastoid opacification possible mastoiditis    PT Comments    Pt assisted with sitting EOB.  Pt's spouse attempted to assist her to standing however unable and therapist assisted with safe transfer onto bed (to prevent fall with pt sliding forward off bed).  Pt also sat with spouse side by side on bed and rested her head on his shoulder.  Pt fatigued quickly and rested return to supine.   Follow Up Recommendations  SNF     Equipment Recommendations  Hospital bed    Recommendations for Other Services       Precautions / Restrictions Precautions Precautions: Fall Precaution Comments: significant posterior bias    Mobility  Bed Mobility Overal bed mobility: Needs Assistance Bed Mobility: Supine to Sit;Sit to Supine     Supine to sit: Total assist;+2 for physical assistance Sit to supine: Total assist;+2 for physical assistance   General bed mobility comments: Needs assistance for LEs (patient not initiating movement either) and trunk negotiation. Significant posterior lean.  Transfers Overall transfer level: Needs assistance     Sit to Stand: Total assist;+2 physical assistance         General transfer comment: spouse attempted to stand pt however pt's feet sliding forward and pt at risk of sliding out of bed due to extension pattern so rehab tech scooted bed pads back onto bed and therapist brought up pts feet; explained to spouse pt's positioning/extension was making transfer more dificult  and she may not be able to be assisted as he was previously performing at home  Ambulation/Gait                 Stairs             Wheelchair Mobility    Modified Rankin (Stroke Patients Only)       Balance Overall balance assessment: Needs assistance Sitting-balance support: No upper extremity supported;Feet supported Sitting balance-Leahy Scale: Zero Sitting balance - Comments: Significant posterior lean.                                    Cognition Arousal/Alertness: Awake/alert Behavior During Therapy: Flat affect Overall Cognitive Status: Impaired/Different from baseline                                 General Comments: slow to process, does better with yes/no questions, difficulty describing how she feels      Exercises      General Comments        Pertinent Vitals/Pain Pain Assessment: Faces Faces Pain Scale: No hurt Pain Intervention(s): Repositioned    Home Living                      Prior Function            PT Goals (current goals can now be found in the care plan section) Progress towards PT goals: Progressing toward goals    Frequency  Min 3X/week      PT Plan Current plan remains appropriate    Co-evaluation   Reason for Co-Treatment: Other (comment) (palliative referral pending, spoke to RN who will contact me if pt needs to be seen for an evaluation following GOC)          AM-PAC PT "6 Clicks" Mobility   Outcome Measure  Help needed turning from your back to your side while in a flat bed without using bedrails?: Total Help needed moving from lying on your back to sitting on the side of a flat bed without using bedrails?: Total Help needed moving to and from a bed to a chair (including a wheelchair)?: Total Help needed standing up from a chair using your arms (e.g., wheelchair or bedside chair)?: Total Help needed to walk in hospital room?: Total Help needed climbing 3-5  steps with a railing? : Total 6 Click Score: 6    End of Session Equipment Utilized During Treatment: Gait belt Activity Tolerance: Patient tolerated treatment well Patient left: in bed;with call bell/phone within reach;with bed alarm set;with family/visitor present Nurse Communication: Mobility status PT Visit Diagnosis: Other abnormalities of gait and mobility (R26.89);Other symptoms and signs involving the nervous system (R29.898)     Time: 1610-9604 PT Time Calculation (min) (ACUTE ONLY): 15 min  Charges:  $Therapeutic Activity: 8-22 mins                    Jannette Spanner PT, DPT Acute Rehabilitation Services Pager: [phone] Office: [phone]  York Ram E 04/26/2020, 12:58 PM

## 2020-04-26 NOTE — TOC Progression Note (Addendum)
Transition of Care [facility]) - Progression Note    Patient Details  Name: [name] MRN: [id number] Date of Birth: 03/29/1951  [facility] CM/SW Contact  Ross Ludwig, [place] Phone Number: 04/26/2020, 4:43 PM  Clinical Narrative:     CSW following patient's case.  Home with hospice verse SNF for rehab, faimily are not sure of which disposition they want at this time.  CSW continuing to follow patient's progress throughout discharge planning.     Expected Discharge Plan and Services                                                 Social Determinants of Health (SDOH) Interventions    Readmission Risk Interventions Readmission Risk Prevention Plan 11/03/2018  Transportation Screening Complete  Medication Review Press photographer) Complete  PCP or Specialist appointment within 3-5 days of discharge Complete  HRI or Home Care Consult Patient refused  SW Recovery Care/Counseling Consult Patient refused  Palliative Care Screening Not [place] Not Applicable  Some recent data might be hidden

## 2020-04-26 NOTE — Plan of Care (Signed)
  Problem: Coping: Goal: Level of anxiety will decrease Outcome: Progressing   Problem: Pain Managment: Goal: General experience of comfort will improve Outcome: Progressing   

## 2020-04-26 NOTE — Evaluation (Signed)
SLP Cancellation Note  Patient Details Name: [name] MRN: [id number] DOB: [birth year]   Cancelled treatment:         palliative referral pending, spoke to RN who will contact me if pt needs to be seen for an evaluation following GOC   Macario Golds 04/26/2020, 11:20 AM

## 2020-04-26 NOTE — Evaluation (Signed)
Clinical/Bedside Swallow Evaluation Patient Details  Name: [name] MRN: [id number] Date of Birth: February 21, 1951  Today's Date: 04/26/2020 Time: SLP Start Time (ACUTE ONLY): 2426 SLP Stop Time (ACUTE ONLY): 1810 SLP Time Calculation (min) (ACUTE ONLY): 25 min  Past Medical History:  Past Medical History:  Diagnosis Date  . Arthritis   . Chronic pain   . Hyperlipidemia   . Hypertension   . Low blood magnesium 10/04/2018  . lung ca dx'd 06/2018  . Stroke [facility])    06/19/2018 minor   Past Surgical History:  Past Surgical History:  Procedure Laterality Date  . BIOPSY  06/19/2018   Procedure: BIOPSY;  Surgeon: Laurence Spates, MD;  Location: [facility];  Service: Endoscopy;;  . BIOPSY  10/11/2018   Procedure: BIOPSY;  Surgeon: Laurence Spates, MD;  Location: [facility];  Service: Endoscopy;;  . ENDARTERECTOMY Right 06/26/2018   Procedure: ENDARTERECTOMY CAROTID RIGHT;  Surgeon: Serafina Mitchell, MD;  Location: [facility] OR;  Service: Vascular;  Laterality: Right;  . ESOPHAGOGASTRODUODENOSCOPY (EGD) WITH PROPOFOL N/A 06/19/2018   Procedure: ESOPHAGOGASTRODUODENOSCOPY (EGD) WITH PROPOFOL;  Surgeon: Laurence Spates, MD;  Location: [facility];  Service: Endoscopy;  Laterality: N/A;  . FLEXIBLE SIGMOIDOSCOPY N/A 10/11/2018   Procedure: FLEXIBLE SIGMOIDOSCOPY;  Surgeon: Laurence Spates, MD;  Location: [facility];  Service: Endoscopy;  Laterality: N/A;  . IR IMAGING GUIDED PORT INSERTION  10/07/2018  . PATCH ANGIOPLASTY Right 06/26/2018   Procedure: PATCH ANGIOPLASTY USING A XENOSURE 1cm x 6cm BIOLOGIC PATCH;  Surgeon: Serafina Mitchell, MD;  Location: [facility];  Service: Vascular;  Laterality: Right;  Marland Kitchen VIDEO BRONCHOSCOPY WITH ENDOBRONCHIAL ULTRASOUND N/A 08/14/2018   Procedure: VIDEO BRONCHOSCOPY WITH ENDOBRONCHIAL ULTRASOUND;  Surgeon: Collene Gobble, MD;  Location: [facility] OR;  Service: Thoracic;  Laterality: N/A;   HPI:  [name] is a 69 y.o. female with medical history significant of  metastatic small cell lung cancer to the brain, CVA, chronic kidney disease stage III, essential hypertension, recurrent GI bleed, who is being assessed for probably recurrent brain metastasis and MRI was being scheduled this week. Head CT without contrast shows progressive decrease in size of cystic and solid intracranial masses..  There was interval development of left mastoid opacification possible mastoiditis.    Assessment / Plan / Recommendation Clinical Impression  Pt continues with primary oral cognitive based dysphagia likely due to brain mets.  Prolonged oral holding noted with audible subswallows with liquids only - suspect due to oral piecemealing with liquids.  Cough x1 noted after water swallow via straw.  Suspect this occured due to spillage of water over lfood retained in mouth.  Advised spouse to consider using tsps instead of straw it pt coughing with liquids.  He then provided pt with liquid via tsp with pt demonstrating good tolerance, no coughing with delayed swallow.  Suspect she will have episodic aspiration especially with liquids if she orally holds as this may allow liquids to spill from mouth into open larynx/airway.    SLP provided pt with oreo's, sunchips, icecream, soda and spouse gave her a skittles flavored yogurt, potato chip and water.  Pt stated "It doesn't work" when attempting to bite off a piece of hard potato chip.    She also is observed to sigh on several occasions post-swallow, appeared exasperated.  She at one point stated "too much" indicating feeling overwhelmed.  SLP backed away to decrease distraction for pt.    Education took place with spouse re need to assure pt swallows before  giving more, using tsps of liquids as needed, oral suction with hand over hand assist from pt to allow her control, and food/drink items that may provide improved sensory input.  Spouse demonstrated use of strategies while SLP was in the room.    Will follow up x1 to assure all  education completed and pt tolerating po as well as able.  Do not anticipate pt will consume adequate po intake for nutritional goals given her lack of desire for po.   Her spouse does an excellent job of coaching her to consume intake including providing small amounts at a time and he stated thank you for the tips SLP provided to him re: pt's dysphagia.       SLP Visit Diagnosis: Dysphagia, oral phase (R13.11)    Aspiration Risk  Moderate aspiration risk    Diet Recommendation Regular;Thin liquid (to allow spouse to select food items)   Liquid Administration via: Cup;Spoon;Straw Medication Administration: Crushed with puree Supervision: Staff to assist with self feeding;Full supervision/cueing for compensatory strategies Compensations: Minimize environmental distractions;Slow rate;Small sips/bites;Lingual sweep for clearance of pocketing (oral suction prn, hand over hand assist, check with flashlight for oral clearance) Postural Changes: Seated upright at 90 degrees;Remain upright for at least 30 minutes after po intake    Other  Recommendations Oral Care Recommendations: Oral care BID;Staff/trained caregiver to provide oral care (dentures out nightly)   Follow up Recommendations None      Frequency and Duration min 1 x/week  1 week       Prognosis Prognosis for Safe Diet Advancement: Guarded Barriers to Reach Goals: Other (Comment);Cognitive deficits (medical diagnosis)      Swallow Study   General HPI: [name] is a 69 y.o. female with medical history significant of metastatic small cell lung cancer to the brain, CVA, chronic kidney disease stage III, essential hypertension, recurrent GI bleed, who is being assessed for probably recurrent brain metastasis and MRI was being scheduled this week. Head CT without contrast shows progressive decrease in size of cystic and solid intracranial masses..  There was interval development of left mastoid opacification possible  mastoiditis.  Type of Study: Bedside Swallow Evaluation Previous Swallow Assessment: pt was seen 3 days prior Diet Prior to this Study: Regular;Thin liquids Temperature Spikes Noted: No Respiratory Status: Room air History of Recent Intubation: No Behavior/Cognition: Alert;Cooperative;Requires cueing Oral Care Completed by SLP: Other (Comment) (spouse brushed pt's dentures and helped her place them in her mouth) Oral Cavity - Dentition: Dentures, bottom Vision: Functional for self-feeding Self-Feeding Abilities: Needs assist Patient Positioning: Upright in bed Baseline Vocal Quality: Normal Volitional Cough:  (not elicited) Volitional Swallow: Able to elicit    Oral/Motor/Sensory Function Overall Oral Motor/Sensory Function: Within functional limits   Ice Chips Ice chips: Not tested   Thin Liquid Thin Liquid: Impaired Presentation: Straw;Spoon Oral Phase Impairments: Poor awareness of bolus Oral Phase Functional Implications: Oral holding Pharyngeal  Phase Impairments: Multiple swallows;Cough - Immediate (audible swallow) Other Comments: Audible swallow, Multiple swallows suspected to be due to oral retention spilling into pharynx, Cough x1 noted when consuming water via straw with food retained in oral cavity - Instructed spouse to alternative of offering liquids via tsp to control amount and decrease cough/aspiration risk.  Spouse demonstrated this strategy without cues    Nectar Thick Nectar Thick Liquid: Not tested   Honey Thick Honey Thick Liquid: Not tested   Puree Puree: Impaired (1/2 tsp) Presentation: Spoon Oral Phase Impairments: Poor awareness of bolus Oral Phase Functional  Implications: Oral holding Pharyngeal Phase Impairments:  (audible swallow) Other Comments: audible swallow   Solid     Solid: Impaired Presentation: Self Fed Oral Phase Impairments: Impaired mastication Oral Phase Functional Implications: Impaired mastication;Oral holding Pharyngeal Phase  Impairments: Suspected delayed Swallow      Macario Golds 04/26/2020,7:08 PM Kathleen Lime, MS [place] Office [phone] Pager [phone]

## 2020-04-26 NOTE — NC FL2 (Signed)
[place] MEDICAID FL2 LEVEL OF CARE SCREENING TOOL     IDENTIFICATION  Patient Name: [name] Birthdate: 1950/11/20 Sex: female Admission Date (Current Location): 04/21/2020  [facility] and [place] Number:  Herbalist and Address:  [facility],  [place], [place]      Provider Number: [id number]  Attending Physician Name and Address:  Flora Lipps, MD  Relative Name and Phone Number:  Sunday Shams [phone]  [phone] or [place], [place] [phone]  [phone]    Current Level of Care: Hospital Recommended Level of Care: [place] Prior Approval Number:    Date Approved/Denied:   PASRR Number: [id number]  Discharge Plan: SNF    Current Diagnoses: Patient Active Problem List   Diagnosis Date Noted  . Metastasis ([place]) 04/22/2020  . Thrombocytopenia ([place]) 11/24/2019  . Drug-induced neutropenia ([place]) 11/10/2019  . Encounter for antineoplastic immunotherapy 10/27/2019  . Metastatic small cell carcinoma to brain ([place]) 10/07/2019  . Acute encephalopathy 10/03/2019  . Hypertensive urgency 10/02/2019  . Port-A-Cath in place 04/08/2019  . CKD (chronic kidney disease) stage 3, GFR 30-59 ml/min (HCC) 03/06/2019  . Neuropathy 03/06/2019  . Megaloblastic anemia 03/06/2019  . GIB (gastrointestinal bleeding) 10/30/2018  . Symptomatic anemia 10/30/2018  . Hypotension 10/30/2018  . Hypokalemia 10/30/2018  . Acute renal failure superimposed on stage 3 chronic kidney disease ([place]) 10/30/2018  . Pancytopenia ([place]) 10/30/2018  . Nausea vomiting and diarrhea 10/30/2018  . PUD (peptic ulcer disease) 10/30/2018  . Sepsis ([place]) 10/30/2018  . Hypomagnesemia 09/24/2018  . Small cell carcinoma of lower lobe of right lung ([place]) 08/28/2018  . Encounter for antineoplastic chemotherapy 08/28/2018  . Goals of care, counseling/discussion 08/28/2018  . Right lower lobe lung mass   . Acute CVA  (cerebrovascular accident) ([place]) 06/21/2018  . Acute blood loss anemia 06/21/2018  . Upper GI bleed 06/19/2018  . Psoriasis 12/14/2017  . Knee pain, right 12/14/2017  . BMI 32.0-32.9,adult 12/15/2011  . Underinsured 12/15/2011  . HLD (hyperlipidemia) 03/26/2007  . Essential hypertension 03/26/2007    Orientation RESPIRATION BLADDER Height & Weight     Self, Time, Situation, Place  Normal Incontinent Weight: 161 lb 6 oz (73.2 kg) Height:  5\' 6"  (167.6 cm)  BEHAVIORAL SYMPTOMS/MOOD NEUROLOGICAL BOWEL NUTRITION STATUS      Continent Diet  AMBULATORY STATUS COMMUNICATION OF NEEDS Skin   Limited Assist Verbally Normal                       Personal Care Assistance Level of Assistance  Bathing, Feeding Bathing Assistance: Limited assistance Feeding assistance: Independent       Functional Limitations Info  Sight, Hearing, Speech Sight Info: Adequate Hearing Info: Adequate Speech Info: Adequate    SPECIAL CARE FACTORS FREQUENCY  PT (By licensed PT), OT (By licensed OT)     PT Frequency: Minimum 5x a week OT Frequency: Minimum 5x a week            Contractures Contractures Info: Not present    Additional Factors Info  Code Status, Allergies Code Status Info: DNR Allergies Info: NKA           Current Medications (04/26/2020):  This is the current hospital active medication list Current Facility-Administered Medications  Medication Dose Route Frequency Provider Last Rate Last Admin  . acetaminophen (TYLENOL) tablet 650 mg  650 mg Oral Q6H PRN Elwyn Reach, MD   650 mg at 04/24/20 0437   Or  .  acetaminophen (TYLENOL) suppository 650 mg  650 mg Rectal Q6H PRN Elwyn Reach, MD   650 mg at 04/26/20 1419  . amantadine (SYMMETREL) capsule 100 mg  100 mg Oral BID Pokhrel, Laxman, MD   100 mg at 04/25/20 2139  . amLODipine (NORVASC) tablet 10 mg  10 mg Oral Daily Elwyn Reach, MD   10 mg at 04/25/20 0909  . atorvastatin (LIPITOR) tablet 80 mg  80 mg  Oral Daily Elwyn Reach, MD   80 mg at 04/25/20 0909  . Chlorhexidine Gluconate Cloth 2 % PADS 6 each  6 each Topical Daily Pokhrel, Laxman, MD   6 each at 04/26/20 1000  . clobetasol cream (TEMOVATE) 4.19 % 1 application  1 application Topical BID PRN Gala Romney L, MD      . dexamethasone (DECADRON) injection 4 mg  4 mg Intravenous Q12H Pokhrel, Laxman, MD   4 mg at 04/26/20 1119  . dextrose 5 %-0.9 % sodium chloride infusion   Intravenous Continuous Pokhrel, Laxman, MD 50 mL/hr at 04/25/20 1844 New Bag at 04/25/20 1844  . DULoxetine (CYMBALTA) DR capsule 30 mg  30 mg Oral Daily Elwyn Reach, MD   30 mg at 04/25/20 0910  . feeding supplement (ENSURE ENLIVE) (ENSURE ENLIVE) liquid 237 mL  237 mL Oral BID BM Pokhrel, Laxman, MD   237 mL at 04/25/20 1000  . hydrALAZINE (APRESOLINE) injection 10 mg  10 mg Intravenous Q6H Pokhrel, Laxman, MD   10 mg at 04/26/20 1345  . labetalol (NORMODYNE) injection 10 mg  10 mg Intravenous Q2H PRN Pokhrel, Laxman, MD   10 mg at 04/25/20 0855  . multivitamin with minerals tablet 1 tablet  1 tablet Oral Daily Elwyn Reach, MD   1 tablet at 04/25/20 0910  . ondansetron (ZOFRAN) tablet 4 mg  4 mg Oral Q6H PRN Elwyn Reach, MD       Or  . ondansetron (ZOFRAN) injection 4 mg  4 mg Intravenous Q6H PRN Gala Romney L, MD      . pantoprazole (PROTONIX) injection 40 mg  40 mg Intravenous Daily Pokhrel, Laxman, MD   40 mg at 04/26/20 1119  . pentoxifylline (TRENTAL) CR tablet 400 mg  400 mg Oral BID PC Pokhrel, Laxman, MD   400 mg at 04/25/20 1800  . pregabalin (LYRICA) capsule 50 mg  50 mg Oral Daily Elwyn Reach, MD   50 mg at 04/25/20 0909  . prochlorperazine (COMPAZINE) tablet 10 mg  10 mg Oral Q6H PRN Gala Romney L, MD      . rivaroxaban (XARELTO) tablet 20 mg  20 mg Oral Q supper Gala Romney L, MD   20 mg at 04/25/20 1800  . sodium chloride flush (NS) 0.9 % injection 10-40 mL  10-40 mL Intracatheter PRN Pokhrel, Laxman, MD      .  traMADol (ULTRAM) tablet 50 mg  50 mg Oral QHS PRN Elwyn Reach, MD      . vitamin E capsule 400 Units  400 Units Oral Daily Pokhrel, Laxman, MD   400 Units at 04/24/20 1134     Discharge Medications: Please see discharge summary for a list of discharge medications.  Relevant Imaging Results:  Relevant Lab Results:   Additional Information SSN [id number]  Ross Ludwig, LCSW

## 2020-04-26 NOTE — Progress Notes (Signed)
PROGRESS NOTE  [name] OZH:[id number] DOB: 06-21-1951 DOA: 04/21/2020 PCP: Rutherford Guys, MD   LOS: 5 days   Brief narrative: As per HPI,  [name] is a 69 y.o. female with medical history significant of metastatic small cell lung cancer to the brain, history of CVA, chronic kidney disease stage III, essential hypertension, recurrent GI bleed, presented to the hospital with progressive lethargy, weakness at home.  Patient has a history of hypertension  and despite taking her medication her systolic blood pressure was noted to be more than 629 with diastolic up to 528.  She was having persistent headache and was brought into the hospital.   In the ED, temperature 97.9 blood pressure 228/130 pulse 89 respirate of 18 oxygen sat 98% on room air.  Chemistry shows a creatinine of 1.96 with BUN 35 and glucose 121.  CBCwith platelets 145.  COVID-19 and respiratory panel all negative.  TSH 0.723.  Head CT without contrast shows progressive decrease in size of cystic and solid intracranial masses.  Also changes of radiation therapy.  There was interval development of left mastoid opacification possible mastoiditis.  Patient was then admitted for evaluation and treatment.  Assessment/Plan:  Active Problems:   CKD (chronic kidney disease) stage 3, GFR 30-59 ml/min (HCC)   Hypertensive urgency   Metastatic small cell carcinoma to brain ([place])   Metastasis ([place])  Accelerated hypertension on presentation.  Blood pressure was accelerated yesterday and the patient was discharged on hypertensive medication.  Continue IV hydralazine labetalol.  Unable to give p.o. at this time due to decreased mentation and risk of aspiration.   Lethargy, confusion, worsening mental status.. Patient was switched to IV dexamethasone yesterday with slight improvement in her alertness but still has confusion.  This is likely secondary to secondary to metastatic cerebral disease/radiation necrosis.  Will resume  amantadine, dexamethasone, vitamin E and pentoxifylline as per oncology for possible radiation necrosis if able to swallow.   We will continue dexamethasone to IV twice daily, continue  IV fluids.    Metastatic small cell lung cancer to the brain:  Worsening mental status despite being on decadron, amantadine and pentoxifylline.  Poor prognosis.  History of chemo and radiation therapy.  Head CT without contrast showed improvement and reduction in size of metastasis.    Oncology on board.    Extremely poor intake.  On IV hydration.  We will continue D5 normal saline for now.  AKI on Chronic kidney disease stage IIIb   On presentation of 1.9.  Creatinine 1.4 today on IV fluids.  Volume depletion.  On IV fluids.  Improved  Urinary retention.  Currently improved.  Continue external urinary catheter.    Mild thrombocytopenia:  No bleeding noted.  Platelet count of 117.  Extreme debility, weakness, deconditioning.  PT, OT evaluation recommended skilled nursing facility placement patient is extremely deconditioned and debilitated, barely able to move her extremities.  DVT prophylaxis:  rivaroxaban (XARELTO) tablet 20 mg   Code Status:  Overall prognosis of the patient appears poor.  I communicated with the oncology team today.  It looks like patient has extensive radiation necrosis with small cell cancer and widespread cerebral metastasis..  I have been informed that hospice might be an appropriate transition for the patient.  I again spoke with the patient's husband at bedside at length today.  Patient's husband reiterated that patient would not want to be resuscitated or put on life support or undergo prolonged life-sustaining treatment.  I spoke about  hospice level of care.  He wishes her to be at home during the last days of her life.  Will consult transition of care to coordinate hospice discussion.  Family Communication: Spoke with the patient's husband at bedside.  Status is:  Inpatient  Remains inpatient appropriate because:IV treatments appropriate due to intensity of illness or inability to take PO, Inpatient level of care appropriate due to severity of illness and Oncology evaluation, extreme debility/ deconditioning, ambulatory dysfunction, accelerated hypertension, worsening mental status, hospice discussion.  Dispo: The patient is from: Home              Anticipated d/c is to: Undetermined at this time.  Likely home with hospice.              Anticipated d/c date is: 2 days              Patient currently is not medically stable to d/c.   Consultants:  Oncology  Procedures:  None  Antibiotics:   None  Anti-infectives (From admission, onward)   None     Subjective:  Today, patient appears to be little more alert and mildly responsive today.  Patient was completely obtunded and unresponsive yesterday.  Objective: Vitals:   04/26/20 0543 04/26/20 1127  BP: (!) 128/92 110/88  Pulse: 70 70  Resp: 16 20  Temp: 97.7 F (36.5 C) 98 F (36.7 C)  SpO2: 100% 99%    Intake/Output Summary (Last 24 hours) at 04/26/2020 1142 Last data filed at 04/26/2020 1000 Gross per 24 hour  Intake 594.82 ml  Output 900 ml  Net -305.18 ml   Filed Weights   04/22/20 1753  Weight: 73.2 kg   Body mass index is 26.05 kg/m.   Physical Exam: General: Patient is little more alert awake with minimal meaningful conversation today.  Involuntary movements on the extremities. HENT:   No scleral pallor or icterus noted.  Chest: Diminished breath sound bilateral.  Right chest wall Port-A-Cath in place. CVS: S1 &S2 heard. No murmur.  Regular rate and rhythm. Abdomen: Soft, nontender, nondistended.  Bowel sounds are heard.   Extremities: No cyanosis, clubbing or edema.  Peripheral pulses are palpable. Psych: Alert awake and mildly communicative.  Involuntary movements.  Flat affect. CNS: Generalized weakness of the extremities.  Minimal squeezing  movements. Skin: Warm and dry.  No rashes noted.  Data Review: I have personally reviewed the following laboratory data and studies,  CBC: Recent Labs  Lab 04/21/20 1633 04/21/20 1633 04/22/20 0515 04/23/20 0446 04/24/20 0420 04/25/20 0339 04/26/20 0304  WBC 7.7   < > 6.7 8.8 10.8* 11.8* 8.4  NEUTROABS 7.3  --   --   --   --   --   --   HGB 13.1   < > 10.5* 10.4* 12.3 13.1 11.8*  HCT 40.3   < > 32.1* 32.4* 36.7 38.3 35.2*  MCV 109.5*   < > 108.4* 110.6* 106.4* 105.2* 106.7*  PLT 145*   < > 118* 119* 132* 130* 117*   < > = values in this interval not displayed.   Basic Metabolic Panel: Recent Labs  Lab 04/21/20 1633 04/21/20 1633 04/22/20 0515 04/23/20 0446 04/24/20 0420 04/25/20 0339 04/26/20 0304  NA 137   < > 136 137 137 132* 133*  K 3.8   < > 4.2 3.9 3.6 4.3 4.2  CL 99   < > 104 105 103 99 102  CO2 24   < > 24 23 23 22  21*  GLUCOSE 121*   < > 152* 108* 104* 100* 126*  BUN 35*   < > 36* 33* 28* 25* 28*  CREATININE 1.96*   < > 1.75* 1.52* 1.35* 1.24* 1.44*  CALCIUM 10.1   < > 8.9 9.0 9.5 9.4 9.2  MG 1.8  --   --  1.7  --  1.8  --    < > = values in this interval not displayed.   Liver Function Tests: Recent Labs  Lab 04/21/20 1633 04/22/20 0515 04/23/20 0446  AST 22 17 15   ALT 23 18 18   ALKPHOS 50 38 34*  BILITOT 0.9 0.6 0.5  PROT 7.5 5.6* 5.3*  ALBUMIN 4.0 3.2* 3.3*   No results for input(s): LIPASE, AMYLASE in the last 168 hours. No results for input(s): AMMONIA in the last 168 hours. Cardiac Enzymes: No results for input(s): CKTOTAL, CKMB, CKMBINDEX, TROPONINI in the last 168 hours. BNP (last 3 results) No results for input(s): BNP in the last 8760 hours.  ProBNP (last 3 results) No results for input(s): PROBNP in the last 8760 hours.  CBG: Recent Labs  Lab 04/21/20 1538  GLUCAP 129*   Recent Results (from the past 240 hour(s))  Respiratory Panel by RT PCR (Flu A&B, Covid) - Nasopharyngeal Swab     Status: None   Collection Time: 04/21/20   8:55 PM   Specimen: Nasopharyngeal Swab  Result Value Ref Range Status   SARS Coronavirus 2 by RT PCR NEGATIVE NEGATIVE Final    Comment: (NOTE) SARS-CoV-2 target nucleic acids are NOT DETECTED.  The SARS-CoV-2 RNA is generally detectable in upper respiratoy specimens during the acute phase of infection. The lowest concentration of SARS-CoV-2 viral copies this assay can detect is 131 copies/mL. A negative result does not preclude SARS-Cov-2 infection and should not be used as the sole basis for treatment or other patient management decisions. A negative result may occur with  improper specimen collection/handling, submission of specimen other than nasopharyngeal swab, presence of viral mutation(s) within the areas targeted by this assay, and inadequate number of viral copies (<131 copies/mL). A negative result must be combined with clinical observations, patient history, and epidemiological information. The expected result is Negative.  Fact Sheet for Patients:  PinkCheek.be  Fact Sheet for Healthcare Providers:  GravelBags.it  This test is no t yet approved or cleared by the [place] FDA and  has been authorized for detection and/or diagnosis of SARS-CoV-2 by FDA under an Emergency Use Authorization (EUA). This EUA will remain  in effect (meaning this test can be used) for the duration of the COVID-19 declaration under Section 564(b)(1) of the Act, 21 U.S.C. section 360bbb-3(b)(1), unless the authorization is terminated or revoked sooner.     Influenza A by PCR NEGATIVE NEGATIVE Final   Influenza B by PCR NEGATIVE NEGATIVE Final    Comment: (NOTE) The Xpert Xpress SARS-CoV-2/FLU/RSV assay is intended as an aid in  the diagnosis of influenza from Nasopharyngeal swab specimens and  should not be used as a sole basis for treatment. Nasal washings and  aspirates are unacceptable for Xpert Xpress SARS-CoV-2/FLU/RSV   testing.  Fact Sheet for Patients: PinkCheek.be  Fact Sheet for Healthcare Providers: GravelBags.it  This test is not yet approved or cleared by the [place] FDA and  has been authorized for detection and/or diagnosis of SARS-CoV-2 by  FDA under an Emergency Use Authorization (EUA). This EUA will remain  in effect (meaning this test can be used) for the duration of  the  Covid-19 declaration under Section 564(b)(1) of the Act, 21  U.S.C. section 360bbb-3(b)(1), unless the authorization is  terminated or revoked. Performed at [facility], [place] [address] [place], [place] [address]      Studies: No results found.    Flora Lipps, MD  [facility] 04/26/2020

## 2020-04-26 NOTE — Care Management Important Message (Signed)
Important Message  Patient Details IM Letter given to the Patient Name: [name] MRN: [id number] Date of Birth: [birth year]   Medicare Important Message Given:  Yes     Kerin Salen 04/26/2020, 11:36 AM

## 2020-04-26 NOTE — Progress Notes (Signed)
This approval was made on 10/11 per day shift staff

## 2020-04-27 DIAGNOSIS — C7931 Secondary malignant neoplasm of brain: Secondary | ICD-10-CM | POA: Diagnosis not present

## 2020-04-27 DIAGNOSIS — C7989 Secondary malignant neoplasm of other specified sites: Secondary | ICD-10-CM | POA: Diagnosis not present

## 2020-04-27 DIAGNOSIS — Z515 Encounter for palliative care: Secondary | ICD-10-CM

## 2020-04-27 DIAGNOSIS — R410 Disorientation, unspecified: Secondary | ICD-10-CM | POA: Diagnosis not present

## 2020-04-27 DIAGNOSIS — R531 Weakness: Secondary | ICD-10-CM | POA: Diagnosis not present

## 2020-04-27 DIAGNOSIS — Z7189 Other specified counseling: Secondary | ICD-10-CM

## 2020-04-27 DIAGNOSIS — N1831 Chronic kidney disease, stage 3a: Secondary | ICD-10-CM | POA: Diagnosis not present

## 2020-04-27 DIAGNOSIS — I160 Hypertensive urgency: Secondary | ICD-10-CM | POA: Diagnosis not present

## 2020-04-27 MED ORDER — LORAZEPAM 2 MG/ML IJ SOLN
1.0000 mg | Freq: Once | INTRAMUSCULAR | Status: AC
  Administered 2020-04-27: 1 mg via INTRAVENOUS
  Filled 2020-04-27: qty 1

## 2020-04-27 MED ORDER — HEPARIN SOD (PORK) LOCK FLUSH 100 UNIT/ML IV SOLN
500.0000 [IU] | INTRAVENOUS | Status: AC | PRN
  Administered 2020-04-27: 500 [IU]
  Filled 2020-04-27: qty 5

## 2020-04-27 MED ORDER — PENTOXIFYLLINE ER 400 MG PO TBCR: 400.0000 mg | EXTENDED_RELEASE_TABLET | Freq: Two times a day (BID) | ORAL | Status: AC

## 2020-04-27 MED ORDER — AMANTADINE HCL 100 MG PO CAPS: 100.0000 mg | ORAL_CAPSULE | Freq: Two times a day (BID) | ORAL | Status: AC

## 2020-04-27 MED ORDER — VITAMIN E 180 MG (400 UNIT) PO CAPS: 400.0000 [IU] | ORAL_CAPSULE | Freq: Every day | ORAL | Status: AC

## 2020-04-27 NOTE — Progress Notes (Addendum)
Retry insertion of in and out catheter. Unsuccessful. No urine return. Resistance felt by inserting nurse. Coiling.  Made on call aware via Amion. Awaiting response from on-call practitioner with recommendations.

## 2020-04-27 NOTE — Progress Notes (Signed)
[facility] informed this RN that port could be deaccessed. IV team and Md notified.Eulas Post, RN

## 2020-04-27 NOTE — TOC Initial Note (Signed)
Transition of Care [facility]) - Initial/Assessment Note    Patient Details  Name: [name] MRN: [id number] Date of Birth: Mar 23, 1951  [facility]) CM/SW Contact:    Joaquin Courts, RN Phone Number: 04/27/2020, 11:09 AM  Clinical Narrative:  Patient referred to [place] for placement into [facility] place.  Hospice rep to notify CM once bed is available.                   Expected Discharge Plan: [place] Barriers to Discharge: Continued Medical Work up, Hospice Bed not available   Patient Goals and CMS Choice Patient states their goals for this hospitalization and ongoing recovery are:: to go to residential hospice CMS Medicare.gov Compare Post Acute Care list provided to:: Patient Represenative (must comment) Choice offered to / list presented to : Patient  Expected Discharge Plan and Services Expected Discharge Plan: [place]   Discharge Planning Services: CM Consult Post Acute Care Choice: Hospice Living arrangements for the past 2 months: Single Family Home                 DME Arranged: N/A DME Agency: NA       HH Arranged: NA          Prior Living Arrangements/Services Living arrangements for the past 2 months: Single Family Home   Patient language and need for interpreter reviewed:: Yes Do you feel safe going back to the place where you live?: Yes      Need for Family Participation in Patient Care: Yes (Comment) Care giver support system in place?: Yes (comment)   Criminal Activity/Legal Involvement Pertinent to Current Situation/Hospitalization: No - Comment as needed  Activities of Daily Living Home Assistive Devices/Equipment: Environmental consultant (specify type), Shower chair with back, Wheelchair, Other (Comment) (tub/shower unit, standard toilet, manual wheelchair, front wheeled walker) ADL Screening (condition at time of admission) Patient's cognitive ability adequate to safely complete daily  activities?: No Is the patient deaf or have difficulty hearing?: No Does the patient have difficulty seeing, even when wearing glasses/contacts?: No Does the patient have difficulty concentrating, remembering, or making decisions?: Yes Patient able to express need for assistance with ADLs?: No Does the patient have difficulty dressing or bathing?: Yes Independently performs ADLs?: No Communication: Independent Dressing (OT): Dependent Is this a change from baseline?: Change from baseline, expected to last >3 days Grooming: Dependent Is this a change from baseline?: Change from baseline, expected to last >3 days Feeding: Dependent Is this a change from baseline?: Change from baseline, expected to last >3 days Bathing: Dependent Is this a change from baseline?: Change from baseline, expected to last >3 days Toileting: Dependent Is this a change from baseline?: Change from baseline, expected to last >3days In/Out Bed: Dependent Is this a change from baseline?: Change from baseline, expected to last >3 days Walks in Home: Dependent Is this a change from baseline?: Change from baseline, expected to last >3 days Does the patient have difficulty walking or climbing stairs?: Yes (secondary to weakness) Weakness of Legs: Both Weakness of Arms/Hands: Both  Permission Sought/Granted                  Emotional Assessment           Psych Involvement: No (comment)  Admission diagnosis:  Weakness [R53.1] Metastasis ([place]) [C79.9] Hypertensive urgency [I16.0] Metastatic malignant neoplasm, unspecified site ([place]) [C79.9] Hypertension, unspecified type [I10] Patient Active Problem List   Diagnosis Date Noted  . Metastasis (  [place]) 04/22/2020  . Thrombocytopenia ([place]) 11/24/2019  . Drug-induced neutropenia ([place]) 11/10/2019  . Encounter for antineoplastic immunotherapy 10/27/2019  . Metastatic small cell carcinoma to brain ([place]) 10/07/2019  . Acute encephalopathy 10/03/2019  .  Hypertensive urgency 10/02/2019  . Port-A-Cath in place 04/08/2019  . CKD (chronic kidney disease) stage 3, GFR 30-59 ml/min (HCC) 03/06/2019  . Neuropathy 03/06/2019  . Megaloblastic anemia 03/06/2019  . GIB (gastrointestinal bleeding) 10/30/2018  . Symptomatic anemia 10/30/2018  . Hypotension 10/30/2018  . Hypokalemia 10/30/2018  . Acute renal failure superimposed on stage 3 chronic kidney disease ([place]) 10/30/2018  . Pancytopenia ([place]) 10/30/2018  . Nausea vomiting and diarrhea 10/30/2018  . PUD (peptic ulcer disease) 10/30/2018  . Sepsis ([place]) 10/30/2018  . Hypomagnesemia 09/24/2018  . Small cell carcinoma of lower lobe of right lung ([place]) 08/28/2018  . Encounter for antineoplastic chemotherapy 08/28/2018  . Goals of care, counseling/discussion 08/28/2018  . Right lower lobe lung mass   . Acute CVA (cerebrovascular accident) ([place]) 06/21/2018  . Acute blood loss anemia 06/21/2018  . Upper GI bleed 06/19/2018  . Psoriasis 12/14/2017  . Knee pain, right 12/14/2017  . BMI 32.0-32.9,adult 12/15/2011  . Underinsured 12/15/2011  . HLD (hyperlipidemia) 03/26/2007  . Essential hypertension 03/26/2007   PCP:  Rutherford Guys, MD Pharmacy:   [id number] - [place], [place]. AT [address] [place] [address] Phone: [phone] Fax: [phone]     Social Determinants of Health (SDOH) Interventions    Readmission Risk Interventions Readmission Risk Prevention Plan 11/03/2018  Transportation Screening Complete  Medication Review Press photographer) Complete  PCP or Specialist appointment within 3-5 days of discharge Complete  HRI or Home Care Consult Patient refused  SW Recovery Care/Counseling Consult Patient refused  Palliative Care Screening Not [place] Not Applicable  Some recent data might be hidden

## 2020-04-27 NOTE — Progress Notes (Signed)
Discharge report called to Arbie Cookey at [facility]. Awaiting PTAR for transport.Eulas Post, RN

## 2020-04-27 NOTE — TOC Progression Note (Signed)
Transition of Care [facility]) - Progression Note    Patient Details  Name: [name] MRN: [id number] Date of Birth: August 31, 1950  Transition of Care [facility]) CM/SW Contact  Joaquin Courts, RN Phone Number: 04/27/2020, 4:39 PM  Clinical Narrative:    [facility] place bed is available and ready for patient.  PTAR transportation arranged.     Expected Discharge Plan: [place] Barriers to Discharge: Continued Medical Work up, Hospice Bed not available  Expected Discharge Plan and Services Expected Discharge Plan: [place]   Discharge Planning Services: CM Consult Post Acute Care Choice: Hospice Living arrangements for the past 2 months: Single Family Home Expected Discharge Date: 04/27/20               DME Arranged: N/A DME Agency: NA       HH Arranged: NA           Social Determinants of Health (SDOH) Interventions    Readmission Risk Interventions Readmission Risk Prevention Plan 11/03/2018  Transportation Screening Complete  Medication Review Press photographer) Complete  PCP or Specialist appointment within 3-5 days of discharge Complete  HRI or Home Care Consult Patient refused  SW Recovery Care/Counseling Consult Patient refused  Palliative Care Screening Not [place] Not Applicable  Some recent data might be hidden

## 2020-04-27 NOTE — Progress Notes (Addendum)
Spoke with oncall NP- Sharlet Salina. No orders given at this time. Patient is not in any acute distress. Per on call...will consult urology for recommendations and or insertion of  urinary catheter.

## 2020-04-27 NOTE — Discharge Summary (Signed)
Physician Discharge Summary  NORELY SCHLICK YQM:[id number] DOB: 02-03-1951 DOA: 04/21/2020  PCP: Rutherford Guys, MD  Admit date: 04/21/2020 Discharge date: 04/27/2020  Admitted From: Home  Discharge disposition: Residential hospice   Recommendations for Outpatient Follow-Up:   . Follow up plan as per hospice care provider . 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  Code status: DNR   History of Present Illness:   Heather Atha Lankfordis a 69 y.o.femalewith medical history significant ofmetastatic small cell lung cancer to the brain, history of CVA, chronic kidney disease stage III, essential hypertension, recurrent GI bleed, presented to the hospital with progressive lethargy, weakness at home.  Patient has a history of hypertension and despite taking her medication her systolic blood pressure was noted to be more than 528 with diastolic up to 413.  She was having persistent headache and was brought into the hospital.   In the ED, temperature 97.9 blood pressure 228/130 pulse 89 respirate of 18 oxygen sat 98% on room air. Chemistry shows a creatinine of 1.96 with BUN 35 and glucose 121. CBC with platelets 145. COVID-19 and respiratory panel all negative. TSH 0.723. Head CT without contrast shows progressive decrease in size of cystic and solid intracranial masses with changes of radiation therapy. There was interval development of left mastoid opacification possible mastoiditis. Patient was then admitted for evaluation and treatment.   Hospital Course:   Following  conditions were addressed during hospitalization as listed below,  Accelerated hypertension on presentation.    Received IV hydralazine, labetalol. Currently unsafe for p.o.  Blood pressure has improved.   Lethargy, confusion, worsening mental status. Patient is mildly communicative but completely confused and disoriented.    Received IV dexamethasone in the hospital..  Extensive radiation necrosis as per oncology.  I had a prolonged discussion with oncology team.  Patient is appropriate for hospice as per oncology. Patient is supposed to be on amantadine, dexamethasone, vitamin E and pentoxifylline if able to swallow safely.     Metastatic small cell lung cancer to the brain: Unimproved clinical condition and mental status despite being IV Decadron.  Poor prognosis.  History of chemo and radiation therapy. Head CT without contrast showed improvement and reduction in size of metastasis.    Oncology followed the patient during hospitalization.   Current plan is hospice.  Extremely poor intake.    Received IV hydration with D5 normal saline during hospitalization.  AKI on Chronic kidney disease stage IIIb  On presentation of 1.9.    Received IV fluids, Creatinine improved.  Volume depletion.    Improved with IV fluids.  Urinary retention.    Needed coude catheter due to difficulty catheterization and urinary retention.  We will continue for comfort on discharge    Mild thrombocytopenia: No bleeding noted.  Platelet count of 117 on 04/26/20  Extreme debility, weakness, deconditioning. Patient is extremely deconditioned and debilitated, barely able to move much.    Very poor prognosis.  Disposition.  At this time, patient is stable for transfer to residential hospice.  I had a prolonged discussion with the patient's husband at bedside regarding disposition  Medical Consultants:    Palliative care  Oncology  Procedures:    None Subjective:   Today, patient is mildly  communicative but disoriented and confused.  Follows few commands.  Discharge Exam:   Vitals:   04/27/20 1218 04/27/20 1222  BP: 113/81   Pulse: 68 70  Resp:  13  Temp:    SpO2:  100%   Vitals:   04/26/20 2005 04/27/20 0543 04/27/20 1218 04/27/20 1222  BP: 128/67 131/73 113/81   Pulse: 66 69 68 70  Resp: 18 16  13   Temp: 97.6 F (36.4 C) (!) 97.5 F (36.4 C)    TempSrc: Oral Oral    SpO2: 100% 100%  100%  Weight:      Height:       General: Patient is little more alert on verbal command but confused and disoriented completely.  Involuntary movements on the extremities. HENT:   No scleral pallor or icterus noted.  Chest: Diminished breath sound bilaterally.  Right chest wall Port-A-Cath in place. CVS: S1 &S2 heard. No murmur.  Regular rate and rhythm. Abdomen: Soft, nontender, nondistended.  Bowel sounds are heard.   Extremities: No cyanosis, clubbing or edema.  Peripheral pulses are palpable. Psych: Mildly communicative, disoriented, involuntary movements.  Flat affect. CNS: Generalized weakness of the extremities.  Minimal squeezing movements. Skin: Warm and dry.  No rashes noted.  The results of significant diagnostics from this hospitalization (including imaging, microbiology, ancillary and laboratory) are listed below for reference.     Diagnostic Studies:   CT Head Wo Contrast  Result Date: 04/21/2020 CLINICAL DATA:  Headache, brain tumor EXAM: CT HEAD WITHOUT CONTRAST TECHNIQUE: Contiguous axial images were obtained from the base of the skull through the vertex without intravenous contrast. COMPARISON:  Head CT 10/02/2019, MRI 01/24/2020 FINDINGS: Brain: There has been marked interval response to therapy since prior examinations. Previously noted cystic lesion within the left frontal lobe now measures 4 mm x 8 mm at axial image # 18 (previously measuring 8 mm x 12 mm). Cystic metastasis within the right cerebellar hemisphere measures 15 mm x 19 mm at axial image # 7  (previously measuring 19 mm x 21 mm). Enhancing nodule adjacent to the right caudate head is poorly delineated on this examination, but appears slightly decreased in size since prior MRI exam. Interval development of diffuse low attenuation of the deep white matter may reflect changes of radiation therapy. Cerebral edema could appear similarly, however, there is no significant associated mass effect or midline shift. No new intra or extra-axial mass lesion or fluid collection is identified. Parenchymal volume loss is stable since prior examination and commensurate with the patient's age. Ventricular size is normal and unchanged. No evidence of acute intracranial hemorrhage or infarct. Vascular: No asymmetric hyperdense vasculature at the skull base. Skull: Intact.  No lytic lesions. Sinuses/Orbits: The paranasal sinuses are clear. The orbits are unremarkable Other: Fluid opacification of several left mastoid air cells has developed inferiorly without associated osseous erosion. Middle ear cavities and right mastoid air cells are clear. IMPRESSION: Interval response to therapy with progressive decrease in size of cystic and solid intracranial metastases. Interval development of diffuse low attenuation of the deep cerebral white matter possibly reflecting changes of a radiation therapy in the appropriate clinical setting. Interval development of left mastoid opacification. Correlation for signs and symptoms of mastoiditis may be helpful. Electronically Signed   By: Fidela Salisbury MD   On: 04/21/2020 19:23     Labs:  Basic Metabolic Panel: Recent Labs  Lab 04/21/20 1633 04/21/20 1633 04/22/20 0515 04/22/20 0515 04/23/20 0446 04/23/20 0446 04/24/20 0420 04/24/20 0420 04/25/20 0339 04/26/20 0304  NA 137   < > 136  --  137  --  137  --  132* 133*  K 3.8   < > 4.2   < > 3.9   < > 3.6   < > 4.3 4.2  CL 99   < > 104  --  105  --  103  --  99 102  CO2 24   < > 24  --  23  --  23  --  22 21*  GLUCOSE  121*   < > 152*  --  108*  --  104*  --  100* 126*  BUN 35*   < > 36*  --  33*  --  28*  --  25* 28*  CREATININE 1.96*   < > 1.75*  --  1.52*  --  1.35*  --  1.24* 1.44*  CALCIUM 10.1   < > 8.9  --  9.0  --  9.5  --  9.4 9.2  MG 1.8  --   --   --  1.7  --   --   --  1.8  --    < > = values in this interval not displayed.   GFR Estimated Creatinine Clearance: 38.8 mL/min (A) (by C-G formula based on SCr of 1.44 mg/dL (H)). Liver Function Tests: Recent Labs  Lab 04/21/20 1633 04/22/20 0515 04/23/20 0446  AST 22 17 15   ALT 23 18 18   ALKPHOS 50 38 34*  BILITOT 0.9 0.6 0.5  PROT 7.5 5.6* 5.3*  ALBUMIN 4.0 3.2* 3.3*   No results for input(s): LIPASE, AMYLASE in the last 168 hours. No results for input(s): AMMONIA in the last 168 hours. Coagulation profile No results for input(s): INR, PROTIME in the last 168 hours.  CBC: Recent Labs  Lab 04/21/20 1633 04/21/20 1633 04/22/20 0515 04/23/20 0446 04/24/20 0420 04/25/20 0339 04/26/20 0304  WBC 7.7   < > 6.7 8.8 10.8* 11.8* 8.4  NEUTROABS 7.3  --   --   --   --   --   --   HGB 13.1   < > 10.5* 10.4* 12.3 13.1 11.8*  HCT 40.3   < > 32.1* 32.4* 36.7 38.3 35.2*  MCV 109.5*   < > 108.4* 110.6* 106.4* 105.2* 106.7*  PLT 145*   < > 118* 119* 132* 130* 117*   < > = values in this interval not displayed.   Cardiac Enzymes: No results for input(s): CKTOTAL, CKMB, CKMBINDEX, TROPONINI in the last 168 hours. BNP: Invalid input(s): POCBNP CBG: Recent Labs  Lab 04/21/20 1538  GLUCAP 129*   D-Dimer No results for input(s): DDIMER in the last 72 hours. Hgb A1c No results for input(s): HGBA1C in the last 72 hours. Lipid Profile No results for input(s): CHOL, HDL, LDLCALC, TRIG, CHOLHDL, LDLDIRECT in the last 72 hours. Thyroid function studies No results for input(s): TSH, T4TOTAL, T3FREE, THYROIDAB in the last 72 hours.  Invalid input(s): FREET3 Anemia work up No results for input(s): VITAMINB12, FOLATE, FERRITIN, TIBC, IRON,  RETICCTPCT in the last 72 hours. Microbiology Recent Results (from the past 240 hour(s))  Respiratory Panel by RT PCR (Flu A&B, Covid) - Nasopharyngeal Swab     Status: None   Collection Time: 04/21/20  8:55 PM   Specimen: Nasopharyngeal Swab  Result Value  Ref Range Status   SARS Coronavirus 2 by RT PCR NEGATIVE NEGATIVE Final    Comment: (NOTE) SARS-CoV-2 target nucleic acids are NOT DETECTED.  The SARS-CoV-2 RNA is generally detectable in upper respiratoy specimens during the acute phase of infection. The lowest concentration of SARS-CoV-2 viral copies this assay can detect is 131 copies/mL. A negative result does not preclude SARS-Cov-2 infection and should not be used as the sole basis for treatment or other patient management decisions. A negative result may occur with  improper specimen collection/handling, submission of specimen other than nasopharyngeal swab, presence of viral mutation(s) within the areas targeted by this assay, and inadequate number of viral copies (<131 copies/mL). A negative result must be combined with clinical observations, patient history, and epidemiological information. The expected result is Negative.  Fact Sheet for Patients:  PinkCheek.be  Fact Sheet for Healthcare Providers:  GravelBags.it  This test is no t yet approved or cleared by the [place] FDA and  has been authorized for detection and/or diagnosis of SARS-CoV-2 by FDA under an Emergency Use Authorization (EUA). This EUA will remain  in effect (meaning this test can be used) for the duration of the COVID-19 declaration under Section 564(b)(1) of the Act, 21 U.S.C. section 360bbb-3(b)(1), unless the authorization is terminated or revoked sooner.     Influenza A by PCR NEGATIVE NEGATIVE Final   Influenza B by PCR NEGATIVE NEGATIVE Final    Comment: (NOTE) The Xpert Xpress SARS-CoV-2/FLU/RSV assay is intended as an aid in   the diagnosis of influenza from Nasopharyngeal swab specimens and  should not be used as a sole basis for treatment. Nasal washings and  aspirates are unacceptable for Xpert Xpress SARS-CoV-2/FLU/RSV  testing.  Fact Sheet for Patients: PinkCheek.be  Fact Sheet for Healthcare Providers: GravelBags.it  This test is not yet approved or cleared by the [place] FDA and  has been authorized for detection and/or diagnosis of SARS-CoV-2 by  FDA under an Emergency Use Authorization (EUA). This EUA will remain  in effect (meaning this test can be used) for the duration of the  Covid-19 declaration under Section 564(b)(1) of the Act, 21  U.S.C. section 360bbb-3(b)(1), unless the authorization is  terminated or revoked. Performed at [facility], [place] [address], [place], [place] [address]      Discharge Instructions:   Discharge Instructions    Diet general   Complete by: As directed    Discharge instructions   Complete by: As directed    Further care as per residential hospice provider   Increase activity slowly   Complete by: As directed      Allergies as of 04/27/2020   No Known Allergies     Medication List    STOP taking these medications   atorvastatin 80 MG tablet Commonly known as: LIPITOR   multivitamin with minerals Tabs tablet   potassium chloride SA 20 MEQ tablet Commonly known as: KLOR-CON   pregabalin 50 MG capsule Commonly known as: LYRICA     TAKE these medications   amantadine 100 MG capsule Commonly known as: SYMMETREL Take 1 capsule (100 mg total) by mouth 2 (two) times daily.   amLODipine 10 MG tablet Commonly known as: NORVASC Take 1 tablet (10 mg total) by mouth daily as needed. What changed: when to take this   Clobetasol Prop Emollient Base 0.05 % emollient cream Commonly known as: Clobetasol Propionate E Apply 1 application topically 2 (two) times  daily. What changed:   when  to take this  reasons to take this   dexamethasone 4 MG tablet Commonly known as: DECADRON Take 1 tablet (4 mg total) by mouth 2 (two) times daily.   DULoxetine 30 MG capsule Commonly known as: CYMBALTA TAKE 1 CAPSULE BY MOUTH EVERY DAY What changed: how much to take   pentoxifylline 400 MG CR tablet Commonly known as: TRENTAL Take 1 tablet (400 mg total) by mouth 2 (two) times daily after a meal.   prochlorperazine 10 MG tablet Commonly known as: COMPAZINE Take 1 tablet (10 mg total) by mouth every 6 (six) hours as needed for nausea or vomiting.   traMADol 50 MG tablet Commonly known as: ULTRAM Take 1-2 tablets (50-100 mg total) by mouth at bedtime as needed. What changed:   how much to take  reasons to take this   vitamin E 180 MG (400 UNITS) capsule Take 1 capsule (400 Units total) by mouth daily.   Xarelto 20 MG Tabs tablet Generic drug: rivaroxaban TAKE 1 TABLET BY MOUTH EVERY DAY WITH SUPPER What changed: See the new instructions.         Time coordinating discharge: 39 minutes  Signed:  Ninette Cotta  [facility] 04/27/2020, 4:33 PM

## 2020-04-27 NOTE — Consult Note (Signed)
Consultation Note Date: 04/27/2020   Patient Name: [name]  DOB: [birth year]  MRN: [id number]  Age / Sex: 69 y.o., female  PCP: Rutherford Guys, MD Referring Physician: Flora Lipps, MD  Reason for Consultation: Establishing goals of care  HPI/Patient Profile: 69 y.o. female    admitted on 04/21/2020    Clinical Assessment and Goals of Care: 69 year old lady who lives at home with her Heather in [place], [place], past medical history of life limiting illness of metastatic small cell lung cancer to brain, prior history of stroke, prior history of stage III chronic kidney disease and hypertension, recurrent GI bleed.  Patient follows at the cancer center and sees Dr. Earlie Server and Dr. Mickeal Skinner.  Patient admitted with accelerated hypertension, lethargy confusion worsening mental status.  Seen by oncology.  Patient suspected to have metastatic cerebral disease/radiation necrosis.  She was given a trial of amantadine dexamethasone vitamin E and pentoxifylline.  She was given IV fluids.  Close monitoring was done.  She remains admitted to hospital medicine service.  Patient with ongoing decline debility and decompensation.  Palliative consult for goals of care discussions has been requested.  Patient is resting in bed.  She appears with generalized edema.  She is not awake or alert.  She appears pale.  She does not respond.  She has shallow regular breathing.  [name] present at the bedside.  We discussed about palliative care and initiated goals of care discussions as follows:  Palliative medicine is specialized medical care for people living with serious illness. It focuses on providing relief from the symptoms and stress of a serious illness. The goal is to improve quality of life for both the patient and the family.  Goals of care: Broad aims of medical therapy in relation to the patient's  values and preferences. Our aim is to provide medical care aimed at enabling patients to achieve the goals that matter most to them, given the circumstances of their particular medical situation and their constraints.   Brief life review performed.  Goals wishes and values important to the patient and family as a unit attempted to be explored.  Patient and her Heather have known each other since high school.  Patient's Heather is extremely tearful.  We discussed about patient's serious illness and her high risk of ongoing decline decompensation.  Discussed frankly but compassionately that prognosis appears markedly limited.  Introduced hospice philosophy of care as a mode of care designed for aggressive symptom management and comfort measures at end-of-life.  Discussed about different levels of hospice support.  We discussed pros and cons of home with hospice support versus residential hospice.  All of his questions addressed to the best of my ability.  See recommendations as put forth below.  Thank you for the consult.  Communicated via secure chat with TRH MD.  NEXT OF KIN Heather  SUMMARY OF RECOMMENDATIONS   DNR/DNI Comfort measures only Limited prognosis of possibly less than 2 weeks Residential hospice will request [facility] consult  Code Status/Advance  Care Planning:  DNR    Symptom Management:   As above  Palliative Prophylaxis:   Delirium Protocol  Additional Recommendations (Limitations, Scope, Preferences):  Full Comfort Care  Psycho-social/Spiritual:   Desire for further Chaplaincy support:yes  Additional Recommendations: Education on Hospice  Prognosis:   < 2 weeks  Discharge Planning: Hospice facility      Primary Diagnoses: Present on Admission: . Hypertensive urgency . CKD (chronic kidney disease) stage 3, GFR 30-59 ml/min (HCC) . Metastatic small cell carcinoma to brain ([place]) . Metastasis ([place])   I have reviewed the medical record, interviewed the  patient and family, and examined the patient. The following aspects are pertinent.  Past Medical History:  Diagnosis Date  . Arthritis   . Chronic pain   . Hyperlipidemia   . Hypertension   . Low blood magnesium 10/04/2018  . lung ca dx'd 06/2018  . Stroke ([place])    06/19/2018 minor   Social History   Socioeconomic History  . Marital status: Married    Spouse name: Not on file  . Number of children: 0  . Years of education: Not on file  . Highest education level: Not on file  Occupational History  . Not on file  Tobacco Use  . Smoking status: Former Smoker    Packs/day: 1.00    Years: 10.00    Pack years: 10.00    Types: Cigarettes    Quit date: 06/19/2018    Years since quitting: 1.8  . Smokeless tobacco: Never Used  . Tobacco comment: Currently using Nicorette Gum  Vaping Use  . Vaping Use: Never used  Substance and Sexual Activity  . Alcohol use: Yes    Comment: wine 2 glasses a day   . Drug use: Never    Comment: Hemp Oil  . Sexual activity: Not Currently  Other Topics Concern  . 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 Difficulty of Paying Living Expenses: Not on file  Food Insecurity:   . Worried About Charity fundraiser in the Last Year: Not on file  . Ran Out of Food in the Last Year: Not on file  Transportation Needs:   . Lack of Transportation (Medical): Not on file  . Lack of Transportation (Non-Medical): Not on file  Physical Activity:   . Days of Exercise per Week: Not on file  . Minutes of Exercise per Session: Not on file  Stress:   . Feeling of Stress : Not on file  Social Connections:   . Frequency of Communication with Friends and Family: Not on file  . Frequency of Social Gatherings with Friends and Family: Not on file  . Attends Religious Services: Not on file  .  Active Member of Clubs or Organizations: Not on file  . Attends Archivist Meetings: Not on file  . Marital Status: Not on file   Family History  Problem Relation Age of Onset  . Cancer Mother        lung cancer  . Diabetes Other   . Hyperlipidemia Other   . Hypertension Other   . Cancer Other    Scheduled Meds: . amantadine  100 mg Oral BID  . amLODipine  10 mg Oral Daily  .  atorvastatin  80 mg Oral Daily  . Chlorhexidine Gluconate Cloth  6 each Topical Daily  . dexamethasone (DECADRON) injection  4 mg Intravenous Q12H  . DULoxetine  30 mg Oral Daily  . feeding supplement (ENSURE ENLIVE)  237 mL Oral BID BM  . hydrALAZINE  10 mg Intravenous Q6H  . multivitamin with minerals  1 tablet Oral Daily  . pantoprazole (PROTONIX) IV  40 mg Intravenous Daily  . pentoxifylline  400 mg Oral BID PC  . pregabalin  50 mg Oral Daily  . rivaroxaban  20 mg Oral Q supper  . vitamin E  400 Units Oral Daily   Continuous Infusions: . dextrose 5 % and 0.9% NaCl 50 mL/hr at 04/25/20 1844   PRN Meds:.acetaminophen **OR** acetaminophen, clobetasol cream, labetalol, ondansetron **OR** ondansetron (ZOFRAN) IV, prochlorperazine, sodium chloride flush, traMADol Medications Prior to Admission:  Prior to Admission medications   Medication Sig Start Date End Date Taking? Authorizing Provider  amLODipine (NORVASC) 10 MG tablet Take 1 tablet (10 mg total) by mouth daily as needed. Patient taking differently: Take 10 mg by mouth daily.  10/05/19  Yes Patrecia Pour, MD  atorvastatin (LIPITOR) 80 MG tablet TAKE 1 TABLET (80 MG TOTAL) BY MOUTH DAILY AT 6 PM. Patient taking differently: Take 80 mg by mouth daily.  07/24/19  Yes Rutherford Guys, MD  Clobetasol Prop Emollient Base (CLOBETASOL PROPIONATE E) 0.05 % emollient cream Apply 1 application topically 2 (two) times daily. Patient taking differently: Apply 1 application topically 2 (two) times daily as needed (itching).  09/04/19  Yes Rutherford Guys,  MD  dexamethasone (DECADRON) 4 MG tablet Take 1 tablet (4 mg total) by mouth 2 (two) times daily. 04/19/20  Yes Vaslow, Acey Lav, MD  DULoxetine (CYMBALTA) 30 MG capsule TAKE 1 CAPSULE BY MOUTH EVERY DAY Patient taking differently: Take 30 mg by mouth daily.  01/18/20  Yes Rutherford Guys, MD  Multiple Vitamin (MULTIVITAMIN WITH MINERALS) TABS tablet Take 1 tablet by mouth daily.   Yes [provider]  potassium chloride SA (KLOR-CON) 20 MEQ tablet Take 1 tablet (20 mEq total) by mouth daily. 12/16/19  Yes Curt Bears, MD  pregabalin (LYRICA) 50 MG capsule TAKE 1 CAPSULE 3 TIMES A DAY Patient taking differently: Take 50 mg by mouth daily.  11/03/19  Yes Rutherford Guys, MD  prochlorperazine (COMPAZINE) 10 MG tablet Take 1 tablet (10 mg total) by mouth every 6 (six) hours as needed for nausea or vomiting. 02/17/20  Yes Heilingoetter, Cassandra L, PA-C  traMADol (ULTRAM) 50 MG tablet Take 1-2 tablets (50-100 mg total) by mouth at bedtime as needed. Patient taking differently: Take 50 mg by mouth at bedtime as needed for moderate pain.  02/27/20  Yes Rutherford Guys, MD  XARELTO 20 MG TABS tablet TAKE 1 TABLET BY MOUTH EVERY DAY WITH SUPPER Patient taking differently: Take 20 mg by mouth daily.  03/30/20  Yes Curt Bears, MD   No Known Allergies Review of Systems Does not verbalize Physical Exam Not awake not alert on my examination Diminished breath sounds Appears pale Appears with generalized weakness Appears with mild generalized edema S1-S2  Vital Signs: BP 131/73 (BP Location: Left Arm)   Pulse 69   Temp (!) 97.5 F (36.4 C) (Oral)   Resp 16   Ht 5\' 6"  (1.676 m)   Wt 73.2 kg   SpO2 100%   BMI 26.05 kg/m  Pain Scale: 0-10   Pain Score: Asleep  SpO2: SpO2: 100 % O2 Device:SpO2: 100 % O2 Flow Rate: .   IO: Intake/output summary:   Intake/Output Summary (Last 24 hours) at 04/27/2020 1023 Last data filed at 04/27/2020 0532 Gross per 24 hour  Intake 193.86  ml  Output 100 ml  Net 93.86 ml    LBM: Last BM Date: 04/21/20 Baseline Weight: Weight: 73.2 kg Most recent weight: Weight: 73.2 kg     Palliative Assessment/Data:   PPS 10%  Time In:  9 Time Out:10   Time Total: 60   Greater than 50%  of this time was spent counseling and coordinating care related to the above assessment and plan.  Signed by: Loistine Chance, MD   Please contact Palliative Medicine Team phone at [phone] for questions and concerns.  For individual provider: See Shea Evans

## 2020-04-27 NOTE — Progress Notes (Signed)
PROGRESS NOTE  [name] [name] DOB: August 16, 1950 DOA: 04/21/2020 PCP: Rutherford Guys, MD   LOS: 6 days   Brief narrative: As per HPI,  [name] is a 69 y.o. female with medical history significant of metastatic small cell lung cancer to the brain, history of CVA, chronic kidney disease stage III, essential hypertension, recurrent GI bleed, presented to the hospital with progressive lethargy, weakness at home.  Patient has a history of hypertension  and despite taking her medication her systolic blood pressure was noted to be more than 258 with diastolic up to 527.  She was having persistent headache and was brought into the hospital.   In the ED, temperature 97.9 blood pressure 228/130 pulse 89 respirate of 18 oxygen sat 98% on room air.  Chemistry shows a creatinine of 1.96 with BUN 35 and glucose 121.  CBCwith platelets 145.  COVID-19 and respiratory panel all negative.  TSH 0.723.  Head CT without contrast shows progressive decrease in size of cystic and solid intracranial masses with changes of radiation therapy.  There was interval development of left mastoid opacification possible mastoiditis.  Patient was then admitted for evaluation and treatment.  Assessment/Plan:  Active Problems:   CKD (chronic kidney disease) stage 3, GFR 30-59 ml/min (HCC)   Hypertensive urgency   Metastatic small cell carcinoma to brain ([place])   Metastasis ([place])  Accelerated hypertension on presentation.  on IV hydralazine, labetalol.  Resume po as able.  Currently unsafe for p.o.  Blood pressure has improved.   Lethargy, confusion, worsening mental status.patient is mildly communicative but completely confused and disoriented.  On IV dexamethasone.  Extensive radiation necrosis as per oncology.  I had a prolonged discussion with oncology team.  Patient is appropriate for hospice as per oncology. Patient is supposed to be on amantadine, dexamethasone, vitamin E and pentoxifylline if able  to swallow safely.   We will continue dexamethasone to IV twice daily, continue  IV fluids.    Metastatic small cell lung cancer to the brain:  Unimproved clinical condition and mental status despite being IV Decadron.  Poor prognosis.  History of chemo and radiation therapy.  Head CT without contrast showed improvement and reduction in size of metastasis.    Oncology on board.  Communicated with oncology yesterday.  Plan is consideration of hospice at this time.  Extremely poor intake.  On IV hydration. continue D5 normal saline for now.  AKI on Chronic kidney disease stage IIIb   On presentation of 1.9.  On IV fluids.  Creatinine improved.  Volume depletion.  On IV fluids.  Improved  Urinary retention.  Will need a Foley catheter if retention..    Mild thrombocytopenia:  No bleeding noted.  Platelet count of 117 on 04/26/20  Extreme debility, weakness, deconditioning. Patient is extremely deconditioned and debilitated, barely able to move much.    DVT prophylaxis:  rivaroxaban (XARELTO) tablet 20 mg   Code Status:  Overall prognosis of the patient is poor.  I again had a long conversation with the patient husband at bedside.  He wishes hospice care on discharge.  Palliative care consultation appreciated.  Patient will probably benefit from residential hospice.  Family Communication: Spoke with the patient's husband at bedside.  Status is: Inpatient  Remains inpatient appropriate because:IV treatments appropriate due to intensity of illness or inability to take PO, Inpatient level of care appropriate due to severity of illness. extreme debility/ deconditioning, ambulatory dysfunction, accelerated hypertension, worsening mental status, hospice discussion.  Dispo: The patient is from: Home               Anticipated d/c is to:   Residential hospice versus home with hospice              Anticipated d/c date is: 2 days              Patient currently is not medically stable to  d/c.   Consultants:  Oncology  Palliative care  Procedures:  None  Antibiotics:  . None  Anti-infectives (From admission, onward)   None     Subjective:  Today, patient is mildly communicative but disoriented and confused.  Follows few commands.  Objective: Vitals:   04/26/20 2005 04/27/20 0543  BP: 128/67 131/73  Pulse: 66 69  Resp: 18 16  Temp: 97.6 F (36.4 C) (!) 97.5 F (36.4 C)  SpO2: 100% 100%    Intake/Output Summary (Last 24 hours) at 04/27/2020 0747 Last data filed at 04/27/2020 0532 Gross per 24 hour  Intake 297.13 ml  Output 100 ml  Net 197.13 ml   Filed Weights   04/22/20 1753  Weight: 73.2 kg   Body mass index is 26.05 kg/m.   Physical Exam:  General: Patient is little more alert on verbal command but confused and disoriented completely.  Involuntary movements on the extremities. HENT:   No scleral pallor or icterus noted.  Chest: Diminished breath sound bilaterally.  Right chest wall Port-A-Cath in place. CVS: S1 &S2 heard. No murmur.  Regular rate and rhythm. Abdomen: Soft, nontender, nondistended.  Bowel sounds are heard.   Extremities: No cyanosis, clubbing or edema.  Peripheral pulses are palpable. Psych: Mildly communicative, disoriented, involuntary movements.  Flat affect. CNS: Generalized weakness of the extremities.  Minimal squeezing movements. Skin: Warm and dry.  No rashes noted.  Data Review: I have personally reviewed the following laboratory data and studies,  CBC: Recent Labs  Lab 04/21/20 1633 04/21/20 1633 04/22/20 0515 04/23/20 0446 04/24/20 0420 04/25/20 0339 04/26/20 0304  WBC 7.7   < > 6.7 8.8 10.8* 11.8* 8.4  NEUTROABS 7.3  --   --   --   --   --   --   HGB 13.1   < > 10.5* 10.4* 12.3 13.1 11.8*  HCT 40.3   < > 32.1* 32.4* 36.7 38.3 35.2*  MCV 109.5*   < > 108.4* 110.6* 106.4* 105.2* 106.7*  PLT 145*   < > 118* 119* 132* 130* 117*   < > = values in this interval not displayed.   Basic Metabolic  Panel: Recent Labs  Lab 04/21/20 1633 04/21/20 1633 04/22/20 0515 04/23/20 0446 04/24/20 0420 04/25/20 0339 04/26/20 0304  NA 137   < > 136 137 137 132* 133*  K 3.8   < > 4.2 3.9 3.6 4.3 4.2  CL 99   < > 104 105 103 99 102  CO2 24   < > 24 23 23 22  21*  GLUCOSE 121*   < > 152* 108* 104* 100* 126*  BUN 35*   < > 36* 33* 28* 25* 28*  CREATININE 1.96*   < > 1.75* 1.52* 1.35* 1.24* 1.44*  CALCIUM 10.1   < > 8.9 9.0 9.5 9.4 9.2  MG 1.8  --   --  1.7  --  1.8  --    < > = values in this interval not displayed.   Liver Function Tests: Recent Labs  Lab 04/21/20 1633 04/22/20 0515 04/23/20  0446  AST 22 17 15   ALT 23 18 18   ALKPHOS 50 38 34*  BILITOT 0.9 0.6 0.5  PROT 7.5 5.6* 5.3*  ALBUMIN 4.0 3.2* 3.3*   No results for input(s): LIPASE, AMYLASE in the last 168 hours. No results for input(s): AMMONIA in the last 168 hours. Cardiac Enzymes: No results for input(s): CKTOTAL, CKMB, CKMBINDEX, TROPONINI in the last 168 hours. BNP (last 3 results) No results for input(s): BNP in the last 8760 hours.  ProBNP (last 3 results) No results for input(s): PROBNP in the last 8760 hours.  CBG: Recent Labs  Lab 04/21/20 1538  GLUCAP 129*   Recent Results (from the past 240 hour(s))  Respiratory Panel by RT PCR (Flu A&B, Covid) - Nasopharyngeal Swab     Status: None   Collection Time: 04/21/20  8:55 PM   Specimen: Nasopharyngeal Swab  Result Value Ref Range Status   SARS Coronavirus 2 by RT PCR NEGATIVE NEGATIVE Final    Comment: (NOTE) SARS-CoV-2 target nucleic acids are NOT DETECTED.  The SARS-CoV-2 RNA is generally detectable in upper respiratoy specimens during the acute phase of infection. The lowest concentration of SARS-CoV-2 viral copies this assay can detect is 131 copies/mL. A negative result does not preclude SARS-Cov-2 infection and should not be used as the sole basis for treatment or other patient management decisions. A negative result may occur with  improper  specimen collection/handling, submission of specimen other than nasopharyngeal swab, presence of viral mutation(s) within the areas targeted by this assay, and inadequate number of viral copies (<131 copies/mL). A negative result must be combined with clinical observations, patient history, and epidemiological information. The expected result is Negative.  Fact Sheet for Patients:  PinkCheek.be  Fact Sheet for Healthcare Providers:  GravelBags.it  This test is no t yet approved or cleared by the [place] FDA and  has been authorized for detection and/or diagnosis of SARS-CoV-2 by FDA under an Emergency Use Authorization (EUA). This EUA will remain  in effect (meaning this test can be used) for the duration of the COVID-19 declaration under Section 564(b)(1) of the Act, 21 U.S.C. section 360bbb-3(b)(1), unless the authorization is terminated or revoked sooner.     Influenza A by PCR NEGATIVE NEGATIVE Final   Influenza B by PCR NEGATIVE NEGATIVE Final    Comment: (NOTE) The Xpert Xpress SARS-CoV-2/FLU/RSV assay is intended as an aid in  the diagnosis of influenza from Nasopharyngeal swab specimens and  should not be used as a sole basis for treatment. Nasal washings and  aspirates are unacceptable for Xpert Xpress SARS-CoV-2/FLU/RSV  testing.  Fact Sheet for Patients: PinkCheek.be  Fact Sheet for Healthcare Providers: GravelBags.it  This test is not yet approved or cleared by the [place] FDA and  has been authorized for detection and/or diagnosis of SARS-CoV-2 by  FDA under an Emergency Use Authorization (EUA). This EUA will remain  in effect (meaning this test can be used) for the duration of the  Covid-19 declaration under Section 564(b)(1) of the Act, 21  U.S.C. section 360bbb-3(b)(1), unless the authorization is  terminated or revoked. Performed at  [facility], [place] [address] [place], [place] [address]      Studies: No results found.    Flora Lipps, MD  [facility] 04/27/2020

## 2020-04-27 NOTE — Progress Notes (Signed)
Engineer, maintenance [facility]) Hospital Liaison note.   Received request from [place] for family interest in [facility] with request for transfer today. Chart reviewed and eligibility confirmed.  Spoke to patient and family to confirm interest and explain services. Family agreeable to transfer today. CSW aware.  Registration paperwork will need to be completed. Dr. Orpah Melter to assume care per family.    RN please call report to [phone] once consents are signed for [facility] and please arrange transport for patient.  Thank you,   Clementeen Hoof, BSN, RN    [facility] (listed on [facility] under [facility] [place])   [phone]

## 2020-04-27 NOTE — Progress Notes (Addendum)
Bladder scanned pt secondary to no urine output since beginning of shift. Bladder scan showed 621ml of urine. MD notified. In and Out cath ordered. Attempted x3, no urine output.   Made MD aware of unsuccessful attempts. 1mg  Ativan ordered. Will retry in 45 minutes to an hour after administration of ativan.

## 2020-04-30 ENCOUNTER — Inpatient Hospital Stay: Admission: RE | Admit: 2020-04-30 | Payer: Medicare PPO | Source: Ambulatory Visit

## 2020-05-03 ENCOUNTER — Inpatient Hospital Stay: Payer: Medicare PPO | Admitting: Internal Medicine

## 2020-05-03 ENCOUNTER — Inpatient Hospital Stay: Payer: Medicare PPO | Attending: Internal Medicine

## 2020-05-11 ENCOUNTER — Other Ambulatory Visit: Payer: Medicare PPO

## 2020-05-11 ENCOUNTER — Ambulatory Visit: Payer: Medicare PPO

## 2020-05-11 ENCOUNTER — Ambulatory Visit: Payer: Medicare PPO | Admitting: Internal Medicine

## 2020-05-13 IMAGING — MR MR HEAD WO/W CM
10 of 13 series · 36 of 48 positions shown · IV contrast (Yes)
Comparison: MRI brain 06/21/2018

CLINICAL DATA: Lung cancer.

EXAM:
MRI HEAD WITHOUT AND WITH CONTRAST
TECHNIQUE: Multiplanar, multiecho pulse sequences of the brain and surrounding
structures were obtained without and with intravenous contrast.
CONTRAST:  10 mL Gadavist

[Series 3: T1 · sagittal · 5.0mm · 0.47mm/px · 1 of 23 slices shown]
[im 1/23]
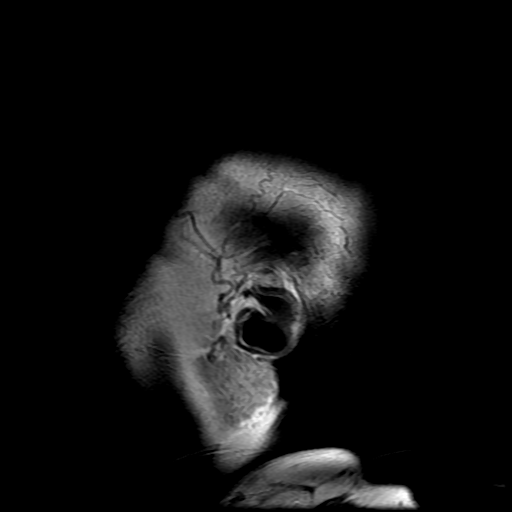

[Series 4: DWI · axial · 3.0mm · 1.09mm/px · z∈[-109,+41]mm · 8 of 102 slices shown (1 of 4)]
[im 1/102]
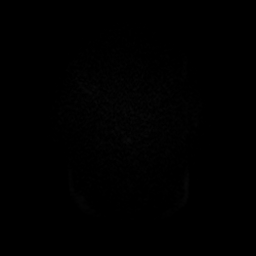
[im 15/102]
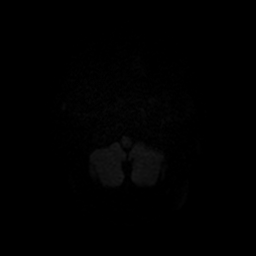
[im 29/102]
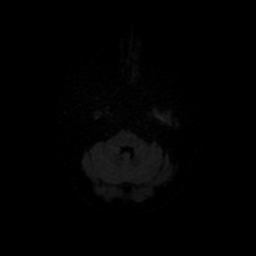
[im 44/102]
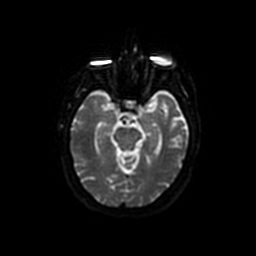
[im 58/102]
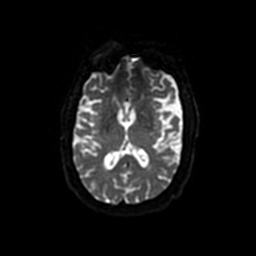
[im 73/102]
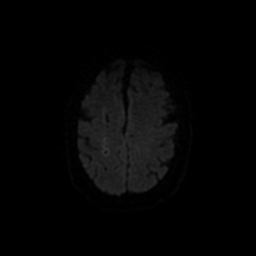
[im 87/102]
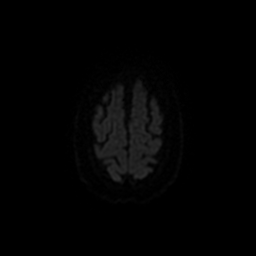
[im 102/102]
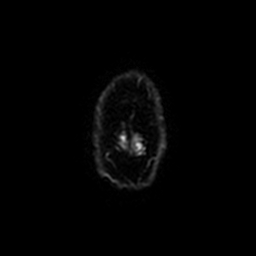

[Series 5: T2 · axial · 5.0mm · 0.43mm/px · z∈[-111,+43]mm · 2 of 23 slices shown]
[im 1/23]
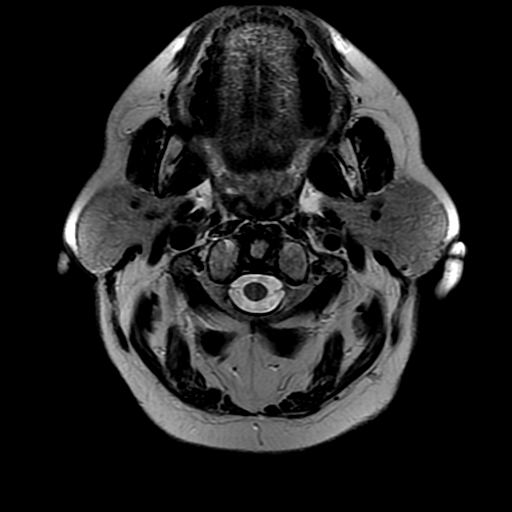
[im 23/23]
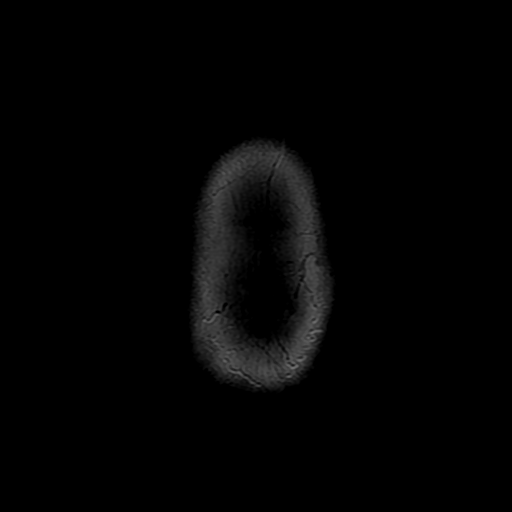

[Series 6: DWI · coronal · 3.0mm · 1.09mm/px · 9 of 110 slices shown (2 of 4)]
[im 1/110]
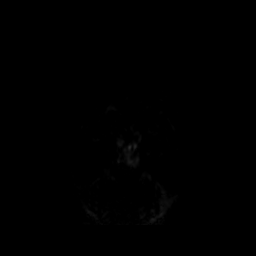
[im 14/110]
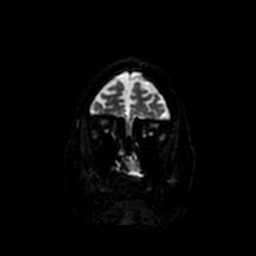
[im 28/110]
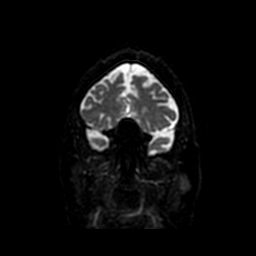
[im 41/110]
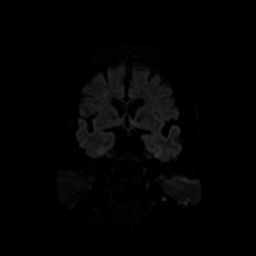
[im 55/110]
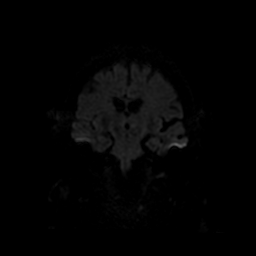
[im 69/110]
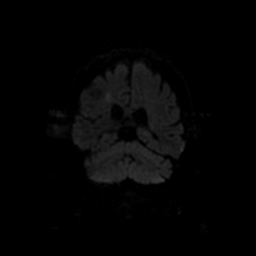
[im 82/110]
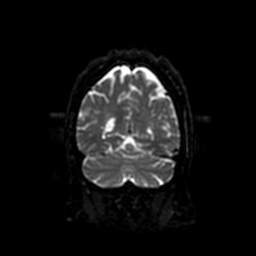
[im 96/110]
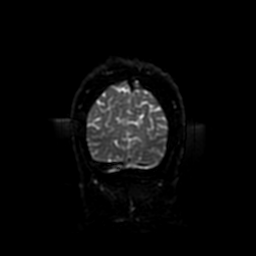
[im 110/110]
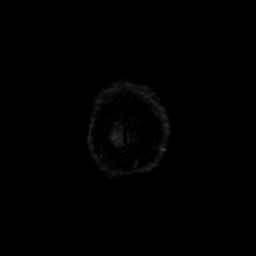

[Series 7: FLAIR · axial · 3.0mm · 0.43mm/px · z∈[-112,+44]mm · 2 of 27 slices shown]
[im 1/27]
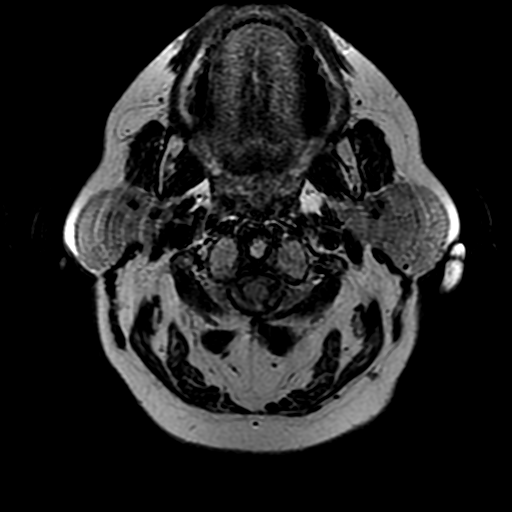
[im 27/27]
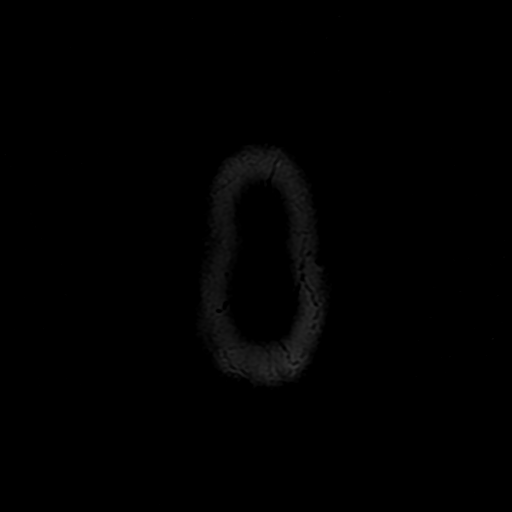

[Series 10: T2 post-contrast · coronal · 5.0mm · 0.45mm/px · 2 of 26 slices shown]
[im 1/26]
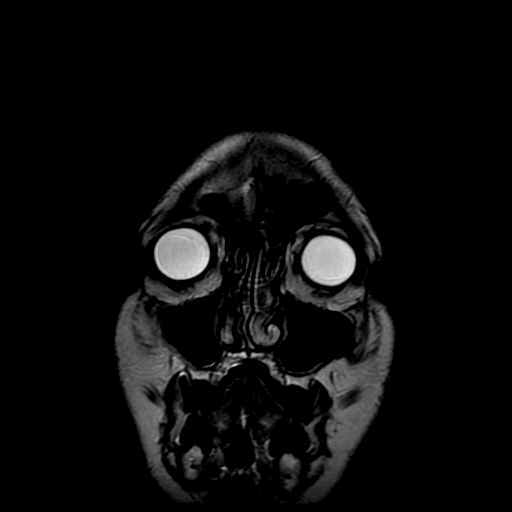
[im 26/26]
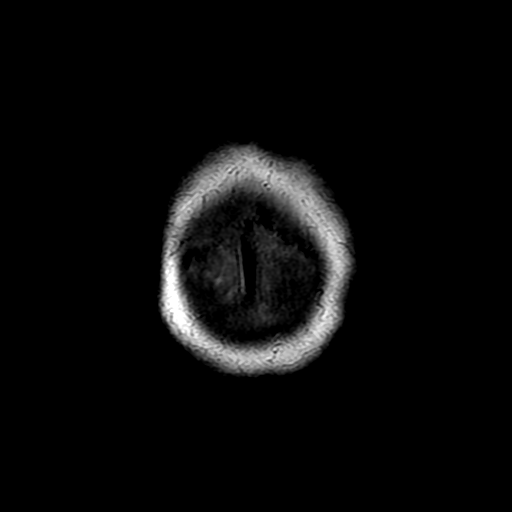

[Series 12: T1 post-contrast · coronal · 5.0mm · 0.45mm/px · 2 of 26 slices shown (1 of 2)]
[im 1/26]
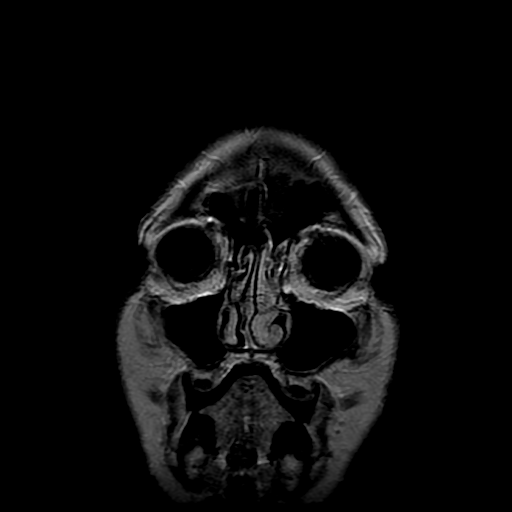
[im 26/26]
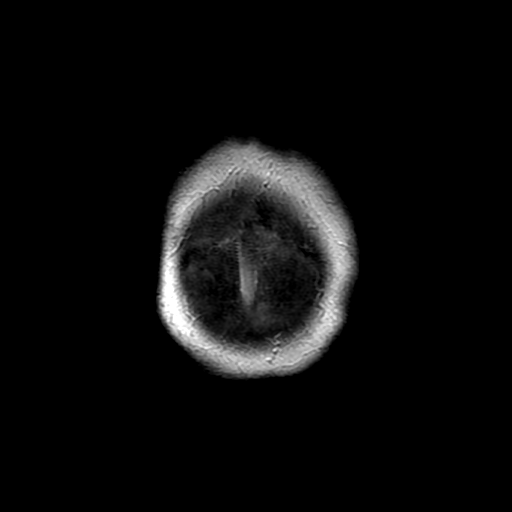

[Series 13: T1 post-contrast · sagittal · 5.0mm · 0.47mm/px · 2 of 23 slices shown (2 of 2)]
[im 1/23]
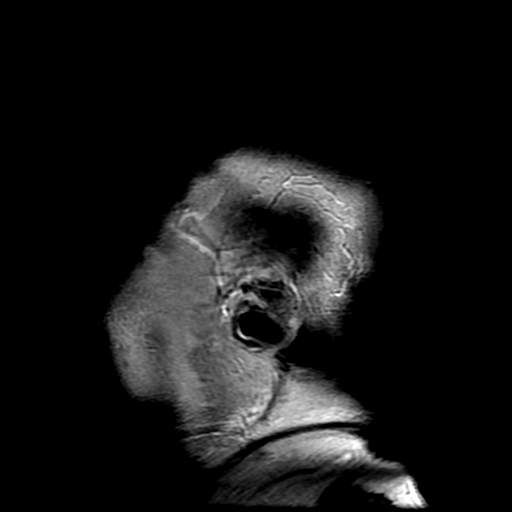
[im 23/23]
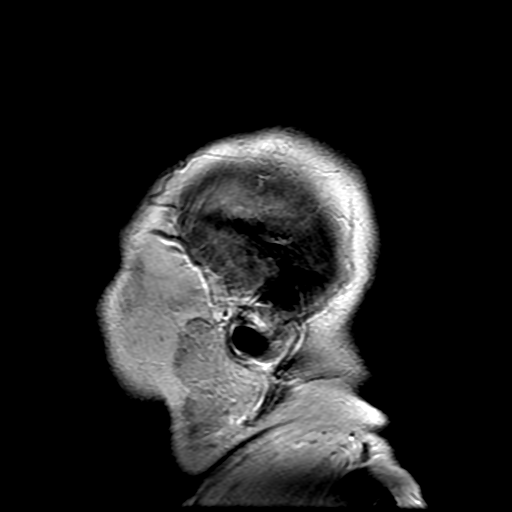

[Series 400: DWI · axial · 3.0mm · 1.09mm/px · z∈[-109,+41]mm · 4 of 51 slices shown (3 of 4)]
[im 1/51]
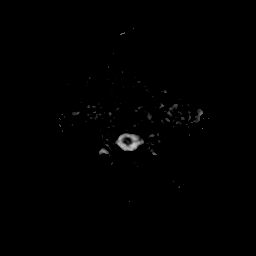
[im 17/51]
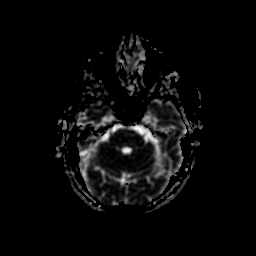
[im 34/51]
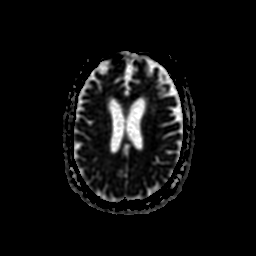
[im 51/51]
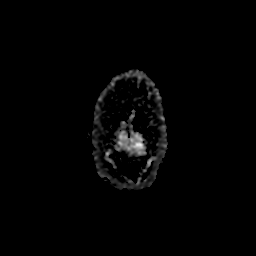

[Series 600: DWI · coronal · 3.0mm · 1.09mm/px · 4 of 55 slices shown (4 of 4)]
[im 1/55]
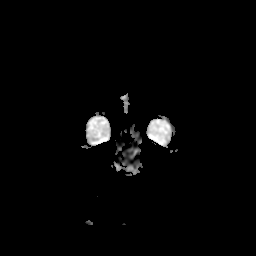
[im 19/55]
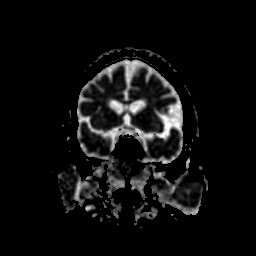
[im 37/55]
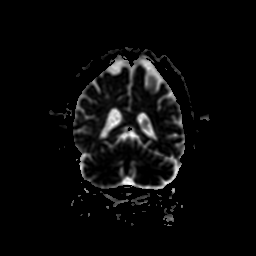
[im 55/55]
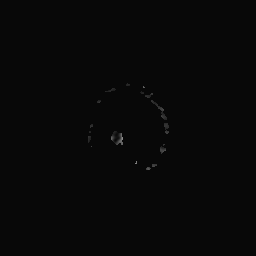

[36 of 48 positions shown; findings below may reference images not displayed]

FINDINGS: Brain: Areas of previous infarcts are again noted and watershed
distribution over the right frontal and parietal lobe. No restricted
diffusion is present to suggest progressive or acute infarcts. No
acute or chronic hemorrhage is present. Moderate atrophy and white
matter changes are present otherwise. Brainstem is normal. Remote
lacunar infarcts are present in the medial cerebellum bilaterally.
The ventricles are of proportionate to the degree of atrophy. No
significant extraaxial fluid collection is present.

Postcontrast images demonstrate no pathologic enhancement.

Vascular: Flow is present in the major intracranial arteries.

Skull and upper cervical spine: The craniocervical junction is
normal. Upper cervical spine is within normal limits. Marrow signal
is unremarkable.

Sinuses/Orbits: The paranasal sinuses and mastoid air cells are
clear. The globes and orbits are within normal limits.
IMPRESSION: 1. Involving right watershed territory infarcts.
2. No acute infarct.
3. No evidence for metastatic disease to the brain.

## 2020-05-17 DEATH — deceased

## 2020-06-08 ENCOUNTER — Other Ambulatory Visit: Payer: Medicare PPO

## 2020-06-08 ENCOUNTER — Ambulatory Visit: Payer: Medicare PPO

## 2020-06-08 ENCOUNTER — Ambulatory Visit: Payer: Medicare PPO | Admitting: Internal Medicine

## 2020-06-20 IMAGING — XA IR LEFT FLUORO GUIDE CV LINE
1 series · 1 of 1 positions shown · non-contrast
Comparison: None.

INDICATION: 67-year-old with small cell lung cancer and poor venous access.

EXAM:
FLUOROSCOPIC AND ULTRASOUND GUIDED PLACEMENT OF A SUBCUTANEOUS PORT

[Series 300: line placements · 1 of 1 slices shown]
[im 1/1]
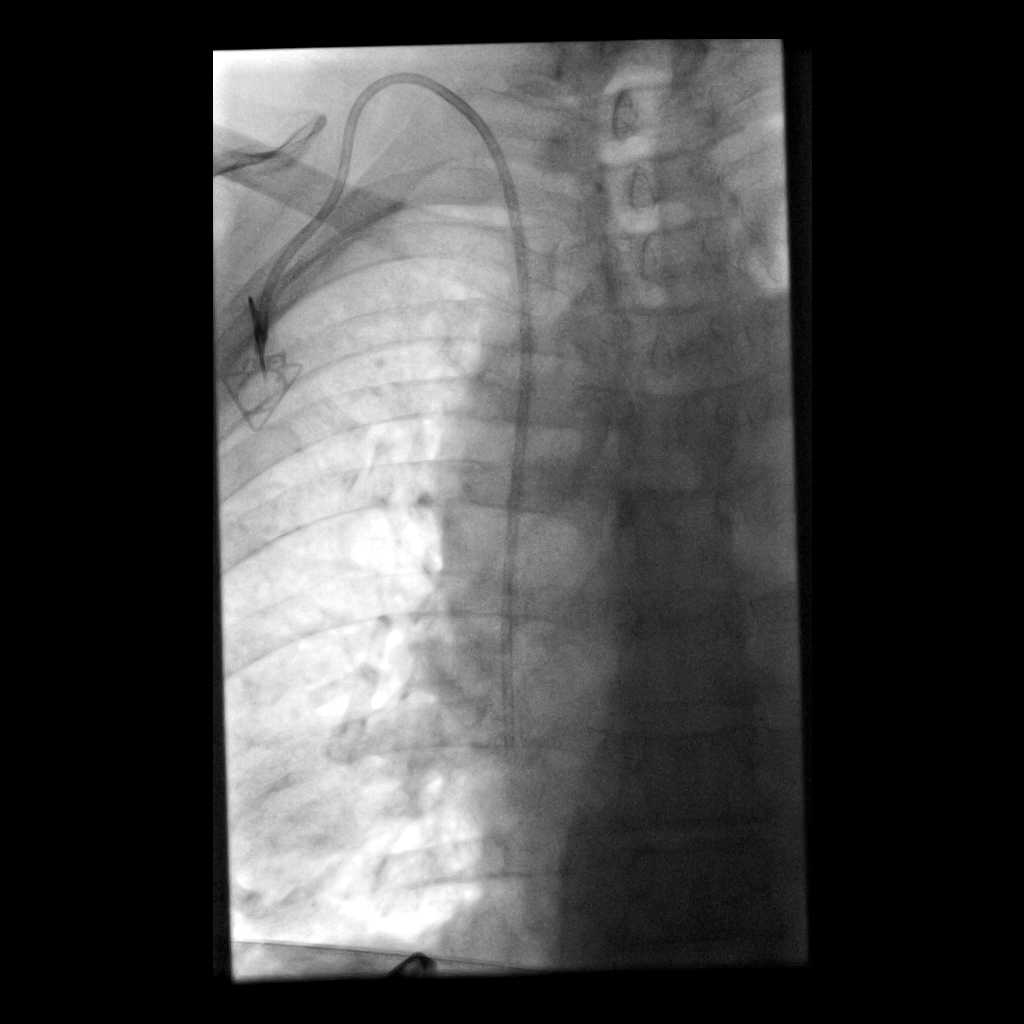

[1 of 1 positions shown; findings below may reference images not displayed]

MEDICATIONS:
Ancef 2 g; The antibiotic was administered within an appropriate
time interval prior to skin puncture.

ANESTHESIA/SEDATION:
Versed 4.0 mg IV; Fentanyl 100 mcg IV;

Moderate Sedation Time:  40 minutes

The patient was continuously monitored during the procedure by the
interventional radiology nurse under my direct supervision.

FLUOROSCOPY TIME:  24 seconds, 5 mGy

COMPLICATIONS:
None immediate.

PROCEDURE:
The procedure, risks, benefits, and alternatives were explained to
the patient. Questions regarding the procedure were encouraged and
answered. The patient understands and consents to the procedure.

Patient was placed supine on the interventional table. Ultrasound
confirmed a patent right internal jugular vein. Ultrasound image was
saved for documentation. The right chest and neck were cleaned with
a skin antiseptic and a sterile drape was placed. Maximal barrier
sterile technique was utilized including caps, mask, sterile gowns,
sterile gloves, sterile drape, hand hygiene and skin antiseptic. The
right neck was anesthetized with 1% lidocaine. Small incision was
made in the right neck with a blade. Micropuncture set was placed in
the right internal jugular vein with ultrasound guidance. The
micropuncture wire was used for measurement purposes. The right
chest was anesthetized with 1% lidocaine with epinephrine. #15 blade
was used to make an incision and a subcutaneous port pocket was
formed. 8 french Power Port was assembled. Subcutaneous tunnel was
formed with a stiff tunneling device. The port catheter was brought
through the subcutaneous tunnel. The port was placed in the
subcutaneous pocket and sutured in place. The micropuncture set was
exchanged for a peel-away sheath. The catheter was placed through
the peel-away sheath and the tip was positioned at the SVC/right
atrium junction. Catheter placement was confirmed with fluoroscopy.
The port was accessed and flushed with heparinized saline. The port
pocket was closed using two layers of absorbable sutures and
Dermabond. The vein skin site was closed using a single layer of
absorbable suture and Dermabond. Sterile dressings were applied.
Patient tolerated the procedure well without an immediate
complication. Ultrasound and fluoroscopic images were taken and
saved for this procedure.
IMPRESSION: Placement of a subcutaneous port device. Catheter tip at the
SVC/right atrium junction.

## 2020-06-23 ENCOUNTER — Encounter: Payer: Medicare PPO | Admitting: Family Medicine

## 2022-06-29 NOTE — Telephone Encounter (Signed)
Opened in error

## 2023-06-06 NOTE — Telephone Encounter (Signed)
Telephone call
